# Patient Record
Sex: Male | Born: 1960
Health system: Southern US, Community
[De-identification: ages and names within clinical notes are randomized; demographics above are authoritative.]

## PROBLEM LIST (undated history)

## (undated) ENCOUNTER — Emergency Department (HOSPITAL_COMMUNITY): Payer: BC Managed Care – PPO | Source: Home / Self Care

## (undated) DIAGNOSIS — F329 Major depressive disorder, single episode, unspecified: Secondary | ICD-10-CM

## (undated) DIAGNOSIS — I10 Essential (primary) hypertension: Secondary | ICD-10-CM

## (undated) DIAGNOSIS — K439 Ventral hernia without obstruction or gangrene: Secondary | ICD-10-CM

## (undated) DIAGNOSIS — F32A Depression, unspecified: Secondary | ICD-10-CM

## (undated) DIAGNOSIS — E785 Hyperlipidemia, unspecified: Secondary | ICD-10-CM

## (undated) DIAGNOSIS — J449 Chronic obstructive pulmonary disease, unspecified: Secondary | ICD-10-CM

## (undated) DIAGNOSIS — H269 Unspecified cataract: Secondary | ICD-10-CM

## (undated) DIAGNOSIS — Z923 Personal history of irradiation: Secondary | ICD-10-CM

## (undated) DIAGNOSIS — E119 Type 2 diabetes mellitus without complications: Secondary | ICD-10-CM

## (undated) DIAGNOSIS — G4730 Sleep apnea, unspecified: Secondary | ICD-10-CM

## (undated) DIAGNOSIS — R9439 Abnormal result of other cardiovascular function study: Secondary | ICD-10-CM

## (undated) DIAGNOSIS — G4733 Obstructive sleep apnea (adult) (pediatric): Secondary | ICD-10-CM

## (undated) DIAGNOSIS — N189 Chronic kidney disease, unspecified: Secondary | ICD-10-CM

## (undated) DIAGNOSIS — I219 Acute myocardial infarction, unspecified: Secondary | ICD-10-CM

## (undated) DIAGNOSIS — I499 Cardiac arrhythmia, unspecified: Secondary | ICD-10-CM

## (undated) DIAGNOSIS — Z9889 Other specified postprocedural states: Secondary | ICD-10-CM

## (undated) DIAGNOSIS — I480 Paroxysmal atrial fibrillation: Secondary | ICD-10-CM

## (undated) DIAGNOSIS — I739 Peripheral vascular disease, unspecified: Secondary | ICD-10-CM

## (undated) DIAGNOSIS — K2270 Barrett's esophagus without dysplasia: Secondary | ICD-10-CM

## (undated) DIAGNOSIS — K219 Gastro-esophageal reflux disease without esophagitis: Secondary | ICD-10-CM

## (undated) DIAGNOSIS — R0602 Shortness of breath: Secondary | ICD-10-CM

## (undated) DIAGNOSIS — I639 Cerebral infarction, unspecified: Secondary | ICD-10-CM

## (undated) HISTORY — DX: Chronic obstructive pulmonary disease, unspecified: J44.9

## (undated) HISTORY — DX: Morbid (severe) obesity due to excess calories: E66.01

## (undated) HISTORY — DX: Hyperlipidemia, unspecified: E78.5

## (undated) HISTORY — DX: Obstructive sleep apnea (adult) (pediatric): G47.33

## (undated) HISTORY — DX: Sleep apnea, unspecified: G47.30

## (undated) HISTORY — DX: Essential (primary) hypertension: I10

## (undated) HISTORY — PX: HERNIA REPAIR: SHX51

## (undated) HISTORY — DX: Peripheral vascular disease, unspecified: I73.9

## (undated) HISTORY — DX: Shortness of breath: R06.02

## (undated) HISTORY — DX: Abnormal result of other cardiovascular function study: R94.39

## (undated) HISTORY — DX: Type 2 diabetes mellitus without complications: E11.9

## (undated) HISTORY — DX: Acute myocardial infarction, unspecified: I21.9

## (undated) HISTORY — DX: Paroxysmal atrial fibrillation: I48.0

## (undated) HISTORY — DX: Other specified postprocedural states: Z98.890

---

## 2001-09-25 ENCOUNTER — Ambulatory Visit (HOSPITAL_COMMUNITY): Admission: RE | Admit: 2001-09-25 | Discharge: 2001-09-25 | Payer: Self-pay | Admitting: Family Medicine

## 2001-09-25 ENCOUNTER — Encounter: Payer: Self-pay | Admitting: Family Medicine

## 2002-08-06 ENCOUNTER — Ambulatory Visit (HOSPITAL_COMMUNITY): Admission: RE | Admit: 2002-08-06 | Discharge: 2002-08-06 | Payer: Self-pay | Admitting: Cardiology

## 2002-08-14 ENCOUNTER — Ambulatory Visit (HOSPITAL_COMMUNITY): Admission: RE | Admit: 2002-08-14 | Discharge: 2002-08-14 | Payer: Self-pay | Admitting: Cardiology

## 2002-08-15 ENCOUNTER — Ambulatory Visit (HOSPITAL_COMMUNITY): Admission: RE | Admit: 2002-08-15 | Discharge: 2002-08-16 | Payer: Self-pay | Admitting: Cardiology

## 2003-10-01 ENCOUNTER — Inpatient Hospital Stay (HOSPITAL_COMMUNITY): Admission: AD | Admit: 2003-10-01 | Discharge: 2003-10-02 | Payer: Self-pay | Admitting: Family Medicine

## 2005-10-14 ENCOUNTER — Ambulatory Visit: Payer: Self-pay | Admitting: Cardiology

## 2005-10-15 ENCOUNTER — Ambulatory Visit: Payer: Self-pay | Admitting: *Deleted

## 2005-10-15 ENCOUNTER — Encounter (HOSPITAL_COMMUNITY): Admission: RE | Admit: 2005-10-15 | Discharge: 2005-10-15 | Payer: Self-pay | Admitting: Cardiology

## 2005-10-18 ENCOUNTER — Ambulatory Visit (HOSPITAL_COMMUNITY): Admission: RE | Admit: 2005-10-18 | Discharge: 2005-10-18 | Payer: Self-pay | Admitting: General Surgery

## 2005-10-28 ENCOUNTER — Ambulatory Visit: Payer: Self-pay | Admitting: Cardiology

## 2005-12-22 ENCOUNTER — Ambulatory Visit (HOSPITAL_COMMUNITY): Admission: RE | Admit: 2005-12-22 | Discharge: 2005-12-22 | Payer: Self-pay | Admitting: Urology

## 2006-01-21 ENCOUNTER — Ambulatory Visit (HOSPITAL_COMMUNITY): Admission: RE | Admit: 2006-01-21 | Discharge: 2006-01-21 | Payer: Self-pay | Admitting: Urology

## 2006-11-01 HISTORY — PX: CORONARY ARTERY BYPASS GRAFT: SHX141

## 2007-09-13 ENCOUNTER — Ambulatory Visit (HOSPITAL_COMMUNITY): Admission: RE | Admit: 2007-09-13 | Discharge: 2007-09-13 | Payer: Self-pay | Admitting: *Deleted

## 2007-09-15 ENCOUNTER — Ambulatory Visit: Payer: Self-pay | Admitting: Cardiothoracic Surgery

## 2007-09-15 ENCOUNTER — Encounter: Payer: Self-pay | Admitting: Cardiothoracic Surgery

## 2007-09-16 ENCOUNTER — Inpatient Hospital Stay (HOSPITAL_COMMUNITY): Admission: AD | Admit: 2007-09-16 | Discharge: 2007-09-24 | Payer: Self-pay | Admitting: *Deleted

## 2007-09-22 ENCOUNTER — Ambulatory Visit: Payer: Self-pay | Admitting: Cardiothoracic Surgery

## 2007-10-02 ENCOUNTER — Ambulatory Visit (HOSPITAL_COMMUNITY): Admission: RE | Admit: 2007-10-02 | Discharge: 2007-10-02 | Payer: Self-pay | Admitting: Cardiothoracic Surgery

## 2007-10-03 ENCOUNTER — Ambulatory Visit: Payer: Self-pay | Admitting: Thoracic Surgery (Cardiothoracic Vascular Surgery)

## 2007-10-12 ENCOUNTER — Encounter: Admission: RE | Admit: 2007-10-12 | Discharge: 2007-10-12 | Payer: Self-pay | Admitting: Cardiothoracic Surgery

## 2007-10-12 ENCOUNTER — Ambulatory Visit: Payer: Self-pay | Admitting: Cardiothoracic Surgery

## 2007-10-27 ENCOUNTER — Ambulatory Visit (HOSPITAL_COMMUNITY): Admission: RE | Admit: 2007-10-27 | Discharge: 2007-10-27 | Payer: Self-pay | Admitting: *Deleted

## 2007-10-31 ENCOUNTER — Encounter (HOSPITAL_COMMUNITY): Admission: RE | Admit: 2007-10-31 | Discharge: 2007-11-01 | Payer: Self-pay | Admitting: *Deleted

## 2007-11-02 DIAGNOSIS — Z9889 Other specified postprocedural states: Secondary | ICD-10-CM

## 2007-11-02 HISTORY — DX: Other specified postprocedural states: Z98.890

## 2007-11-02 HISTORY — PX: CARDIAC CATHETERIZATION: SHX172

## 2007-11-03 DIAGNOSIS — R0609 Other forms of dyspnea: Secondary | ICD-10-CM | POA: Diagnosis present

## 2007-11-03 DIAGNOSIS — R0602 Shortness of breath: Secondary | ICD-10-CM

## 2007-11-03 HISTORY — DX: Shortness of breath: R06.02

## 2007-11-06 ENCOUNTER — Encounter (HOSPITAL_COMMUNITY): Admission: RE | Admit: 2007-11-06 | Discharge: 2007-12-06 | Payer: Self-pay | Admitting: *Deleted

## 2007-11-09 ENCOUNTER — Encounter
Admission: RE | Admit: 2007-11-09 | Discharge: 2007-11-09 | Payer: Self-pay | Admitting: Thoracic Surgery (Cardiothoracic Vascular Surgery)

## 2007-11-09 ENCOUNTER — Ambulatory Visit: Payer: Self-pay | Admitting: Thoracic Surgery (Cardiothoracic Vascular Surgery)

## 2007-11-13 ENCOUNTER — Encounter: Payer: Self-pay | Admitting: Pulmonary Disease

## 2007-12-11 ENCOUNTER — Ambulatory Visit (HOSPITAL_COMMUNITY): Admission: RE | Admit: 2007-12-11 | Discharge: 2007-12-11 | Payer: Self-pay | Admitting: *Deleted

## 2007-12-11 ENCOUNTER — Encounter (HOSPITAL_COMMUNITY): Admission: RE | Admit: 2007-12-11 | Discharge: 2008-01-10 | Payer: Self-pay | Admitting: *Deleted

## 2007-12-15 ENCOUNTER — Encounter: Payer: Self-pay | Admitting: Pulmonary Disease

## 2008-01-02 ENCOUNTER — Ambulatory Visit (HOSPITAL_COMMUNITY): Admission: RE | Admit: 2008-01-02 | Discharge: 2008-01-02 | Payer: Self-pay | Admitting: Family Medicine

## 2008-01-11 ENCOUNTER — Observation Stay (HOSPITAL_COMMUNITY): Admission: EM | Admit: 2008-01-11 | Discharge: 2008-01-13 | Payer: Self-pay | Admitting: Emergency Medicine

## 2008-02-19 ENCOUNTER — Encounter (HOSPITAL_COMMUNITY): Admission: RE | Admit: 2008-02-19 | Discharge: 2008-03-20 | Payer: Self-pay | Admitting: *Deleted

## 2008-03-01 HISTORY — PX: OTHER SURGICAL HISTORY: SHX169

## 2008-03-09 ENCOUNTER — Emergency Department (HOSPITAL_COMMUNITY): Admission: EM | Admit: 2008-03-09 | Discharge: 2008-03-09 | Payer: Self-pay | Admitting: Emergency Medicine

## 2008-03-14 ENCOUNTER — Ambulatory Visit (HOSPITAL_COMMUNITY): Admission: RE | Admit: 2008-03-14 | Discharge: 2008-03-14 | Payer: Self-pay | Admitting: Family Medicine

## 2008-03-14 ENCOUNTER — Ambulatory Visit: Payer: Self-pay | Admitting: Gastroenterology

## 2008-03-14 ENCOUNTER — Encounter: Payer: Self-pay | Admitting: Gastroenterology

## 2008-03-14 ENCOUNTER — Ambulatory Visit (HOSPITAL_COMMUNITY): Admission: RE | Admit: 2008-03-14 | Discharge: 2008-03-14 | Payer: Self-pay | Admitting: Gastroenterology

## 2008-03-22 ENCOUNTER — Encounter (HOSPITAL_COMMUNITY)
Admission: RE | Admit: 2008-03-22 | Discharge: 2008-04-21 | Payer: Self-pay | Admitting: Rehabilitative and Restorative Service Providers"

## 2008-06-12 ENCOUNTER — Encounter: Payer: Self-pay | Admitting: Pulmonary Disease

## 2008-06-20 DIAGNOSIS — I480 Paroxysmal atrial fibrillation: Secondary | ICD-10-CM | POA: Insufficient documentation

## 2008-06-20 DIAGNOSIS — I10 Essential (primary) hypertension: Secondary | ICD-10-CM | POA: Insufficient documentation

## 2008-06-20 DIAGNOSIS — I2510 Atherosclerotic heart disease of native coronary artery without angina pectoris: Secondary | ICD-10-CM | POA: Insufficient documentation

## 2008-06-20 DIAGNOSIS — E782 Mixed hyperlipidemia: Secondary | ICD-10-CM | POA: Insufficient documentation

## 2008-06-21 ENCOUNTER — Ambulatory Visit: Payer: Self-pay | Admitting: Pulmonary Disease

## 2008-06-21 DIAGNOSIS — G4733 Obstructive sleep apnea (adult) (pediatric): Secondary | ICD-10-CM | POA: Insufficient documentation

## 2008-06-21 DIAGNOSIS — G2581 Restless legs syndrome: Secondary | ICD-10-CM | POA: Insufficient documentation

## 2008-06-21 DIAGNOSIS — J309 Allergic rhinitis, unspecified: Secondary | ICD-10-CM | POA: Insufficient documentation

## 2008-06-21 DIAGNOSIS — I219 Acute myocardial infarction, unspecified: Secondary | ICD-10-CM | POA: Insufficient documentation

## 2008-07-21 ENCOUNTER — Encounter: Payer: Self-pay | Admitting: Pulmonary Disease

## 2008-07-25 ENCOUNTER — Inpatient Hospital Stay (HOSPITAL_COMMUNITY): Admission: RE | Admit: 2008-07-25 | Discharge: 2008-07-26 | Payer: Self-pay | Admitting: Cardiology

## 2008-08-02 ENCOUNTER — Telehealth: Payer: Self-pay | Admitting: Pulmonary Disease

## 2008-08-23 ENCOUNTER — Ambulatory Visit: Payer: Self-pay | Admitting: Pulmonary Disease

## 2008-12-11 ENCOUNTER — Encounter: Payer: Self-pay | Admitting: Pulmonary Disease

## 2009-01-08 ENCOUNTER — Ambulatory Visit (HOSPITAL_COMMUNITY): Admission: RE | Admit: 2009-01-08 | Discharge: 2009-01-08 | Payer: Self-pay | Admitting: Family Medicine

## 2009-01-10 ENCOUNTER — Encounter (HOSPITAL_COMMUNITY): Admission: RE | Admit: 2009-01-10 | Discharge: 2009-02-09 | Payer: Self-pay | Admitting: Family Medicine

## 2009-01-10 ENCOUNTER — Ambulatory Visit (HOSPITAL_COMMUNITY): Admission: RE | Admit: 2009-01-10 | Discharge: 2009-01-10 | Payer: Self-pay | Admitting: Internal Medicine

## 2009-01-14 ENCOUNTER — Ambulatory Visit (HOSPITAL_COMMUNITY): Admission: RE | Admit: 2009-01-14 | Discharge: 2009-01-14 | Payer: Self-pay | Admitting: Family Medicine

## 2009-01-22 ENCOUNTER — Encounter: Payer: Self-pay | Admitting: Pulmonary Disease

## 2009-07-03 ENCOUNTER — Telehealth (INDEPENDENT_AMBULATORY_CARE_PROVIDER_SITE_OTHER): Payer: Self-pay | Admitting: *Deleted

## 2009-07-04 ENCOUNTER — Ambulatory Visit: Payer: Self-pay | Admitting: Pulmonary Disease

## 2010-06-01 ENCOUNTER — Ambulatory Visit: Payer: Self-pay | Admitting: Internal Medicine

## 2010-06-01 ENCOUNTER — Encounter: Payer: Self-pay | Admitting: Urgent Care

## 2010-06-01 DIAGNOSIS — K219 Gastro-esophageal reflux disease without esophagitis: Secondary | ICD-10-CM | POA: Insufficient documentation

## 2010-06-01 DIAGNOSIS — K921 Melena: Secondary | ICD-10-CM | POA: Insufficient documentation

## 2010-06-01 DIAGNOSIS — R109 Unspecified abdominal pain: Secondary | ICD-10-CM | POA: Insufficient documentation

## 2010-06-01 DIAGNOSIS — R319 Hematuria, unspecified: Secondary | ICD-10-CM | POA: Insufficient documentation

## 2010-06-02 ENCOUNTER — Ambulatory Visit (HOSPITAL_COMMUNITY): Admission: RE | Admit: 2010-06-02 | Discharge: 2010-06-02 | Payer: Self-pay | Admitting: Internal Medicine

## 2010-06-02 LAB — CONVERTED CEMR LAB
ALT: 41 units/L (ref 0–53)
AST: 24 units/L (ref 0–37)
Alkaline Phosphatase: 76 units/L (ref 39–117)
Amylase: 30 units/L (ref 0–105)
Bilirubin Urine: NEGATIVE
Bilirubin, Direct: 0.1 mg/dL (ref 0.0–0.3)
Indirect Bilirubin: 0.4 mg/dL (ref 0.0–0.9)
Ketones, ur: NEGATIVE mg/dL
Lymphocytes Relative: 29 % (ref 12–46)
Lymphs Abs: 3.8 10*3/uL (ref 0.7–4.0)
Monocytes Relative: 6 % (ref 3–12)
Neutro Abs: 7.9 10*3/uL — ABNORMAL HIGH (ref 1.7–7.7)
Neutrophils Relative %: 61 % (ref 43–77)
Platelets: 325 10*3/uL (ref 150–400)
RBC: 4.72 M/uL (ref 4.22–5.81)
Specific Gravity, Urine: 1.012 (ref 1.005–1.030)
Total Bilirubin: 0.5 mg/dL (ref 0.3–1.2)
Urine Glucose: NEGATIVE mg/dL
Urobilinogen, UA: 0.2 (ref 0.0–1.0)
WBC: 13 10*3/uL — ABNORMAL HIGH (ref 4.0–10.5)
pH: 5 (ref 5.0–8.0)

## 2010-06-10 ENCOUNTER — Encounter: Payer: Self-pay | Admitting: Urgent Care

## 2010-07-03 ENCOUNTER — Ambulatory Visit (HOSPITAL_COMMUNITY): Admission: RE | Admit: 2010-07-03 | Discharge: 2010-07-03 | Payer: Self-pay | Admitting: Cardiology

## 2010-07-08 ENCOUNTER — Ambulatory Visit (HOSPITAL_COMMUNITY): Admission: RE | Admit: 2010-07-08 | Discharge: 2010-07-08 | Payer: Self-pay | Admitting: Cardiovascular Disease

## 2010-07-08 HISTORY — PX: CARDIAC CATHETERIZATION: SHX172

## 2010-08-28 ENCOUNTER — Encounter (INDEPENDENT_AMBULATORY_CARE_PROVIDER_SITE_OTHER): Payer: Self-pay | Admitting: *Deleted

## 2010-09-23 ENCOUNTER — Ambulatory Visit (HOSPITAL_COMMUNITY): Admission: RE | Admit: 2010-09-23 | Discharge: 2010-09-23 | Payer: Self-pay | Admitting: Family Medicine

## 2010-09-29 ENCOUNTER — Ambulatory Visit: Payer: Self-pay | Admitting: Gastroenterology

## 2010-09-29 DIAGNOSIS — R1013 Epigastric pain: Secondary | ICD-10-CM | POA: Insufficient documentation

## 2010-10-01 DIAGNOSIS — Z9889 Other specified postprocedural states: Secondary | ICD-10-CM

## 2010-10-01 HISTORY — DX: Other specified postprocedural states: Z98.890

## 2010-10-08 ENCOUNTER — Ambulatory Visit (HOSPITAL_COMMUNITY)
Admission: RE | Admit: 2010-10-08 | Discharge: 2010-10-08 | Payer: Self-pay | Source: Home / Self Care | Attending: Gastroenterology | Admitting: Gastroenterology

## 2010-11-22 ENCOUNTER — Encounter: Payer: Self-pay | Admitting: Family Medicine

## 2010-12-01 NOTE — Assessment & Plan Note (Signed)
Summary: ABD PAIN/SLJ   Visit Type:  Initial Consult Referring Provider:  Eicchorn Primary Care Provider:  Phillips Odor  Chief Complaint:  abd pain.  History of Present Illness: 50 y/o obese caucasian male w/ intermittant mid-abd pain x 1 year.  c/o choking or dry heaving or occ vomiting couple times per week.  Heartburn/indigestion and takes prlosec as needed 1-2 per week. Denies dysphagia/odynophagia.  Takes vicodin rarely.  Takes 81mg  ASA two times a day.  Denies constipation, diarrhea, or melena.  Not necessarily assoc w/ eating or movt.  Occ scant dark rectal bleeding, pt suspects hemorrhoids.  Colonoscopy 2009 Dr Fields->3-23mm trans colon erosion-surface injury/ulceration w/assoc granulation tissue felt to be due to ASA.  Denies fever or chills.  c/o hematuria.  Current Problems (verified): 1)  Hematuria Unspecified  (ICD-599.70) 2)  Abdominal Pain  (ICD-789.00) 3)  Restless Legs Syndrome  (ICD-333.94) 4)  Obstructive Sleep Apnea  (ICD-327.23) 5)  Allergic Rhinitis  (ICD-477.9) 6)  Hx of Myocardial Infarction  (ICD-410.90) 7)  Coronary Artery Disease  (ICD-414.00) 8)  Exogenous Obesity  (ICD-278.00) 9)  Paroxysmal Atrial Fibrillation  (ICD-427.31) 10)  Hypertension  (ICD-401.9) 11)  Hyperlipidemia  (ICD-272.4)  Current Medications (verified): 1)  Ranexa 1000 Mg  Xr12h-Tab (Ranolazine) .... Take 1 Tablet By Mouth Two Times A Day 2)  Nitroglycerin 0.4 Mg  Subl (Nitroglycerin) .... Use As Directed As Needed 3)  Tylenol Pm Extra Strength 500-25 Mg  Tabs (Diphenhydramine-Apap (Sleep)) .... Take By Mouth As Needed 4)  Promethazine Hcl 25 Mg  Tabs (Promethazine Hcl) .... Take By Mouth As Needed 5)  Vicodin 5-500 Mg  Tabs (Hydrocodone-Acetaminophen) .... Take By Mouth As Needed 6)  Prilosec 20 Mg Cpdr (Omeprazole) .... Take 1 Tablet By Mouth Two Times A Day 7)  Aspirin 81 Mg Tbec (Aspirin) .... Take 1 Tablet By Mouth Two Times A Day 8)  Crestor 20 Mg Tabs (Rosuvastatin Calcium) .... Take 1  Tablet By Mouth Once A Day 9)  Trilipix 135 Mg Cpdr (Choline Fenofibrate) .... Take 1 Tablet By Mouth Once A Day 10)  Niaspan 1000 Mg Cr-Tabs (Niacin (Antihyperlipidemic)) .... One Tablet At Bedtime 11)  Effient 10 Mg Tabs (Prasugrel Hcl) .... Take 1 Tablet By Mouth Once A Day 12)  Bystolic 10 Mg Tabs (Nebivolol Hcl) .... 1/2 Tablet Daily 13)  Furosemide 80 Mg Tabs (Furosemide) .... Take 1 Tablet By Mouth Once A Day 14)  Klor-Con 20 Meq Pack (Potassium Chloride) .... One Tablet Twice Daily 15)  Isosorbide Mononitrate Cr 60 Mg Xr24h-Tab (Isosorbide Mononitrate) .... One Tablet Every 8 Hours 16)  Lisinopril 5 Mg Tabs (Lisinopril) .... 1/2 Tablet Daily  Allergies (verified): 1)  ! Iodine (Iodine) 2)  ! * Contrast Dye  Past History:  Past Medical History: Current Problems:  ALLERGIC RHINITIS (ICD-477.9) Hx of MYOCARDIAL INFARCTION (ICD-410.90) CORONARY ARTERY DISEASE (ICD-414.00)-s/p cabg EXOGENOUS OBESITY (ICD-278.00) PAROXYSMAL ATRIAL FIBRILLATION (ICD-427.31) HYPERTENSION (ICD-401.9) COPD HYPERLIPIDEMIA (ICD-272.4) Sleep apnea colonoscopy 2009 Dr Leanne Lovely colon ulcer felt to be sec ASA Hx anaphylaxis w/ IV contrast    Past Surgical History: 2007 ventral hernia repair Dr Lovell Sheehan  CABG 09/2007 Dr Ranell Patrick  Family History: heart disease: mother, father No known family history of colorectal carcinoma, IBD, liver or chronic GI problems.  Social History: Patient is a current smoker.  5 cigs per day  pt is married.  Disability 2008 Alcohol Use - beer 2x/wk Illicit Drug Use - no Drug Use:  no  Review of Systems  See HPI General:  Complains of fatigue; denies fever, chills, sweats, anorexia, weakness, malaise, weight loss, and sleep disorder. CV:  Denies chest pains, angina, palpitations, syncope, dyspnea on exertion, orthopnea, PND, peripheral edema, and claudication. Resp:  Denies dyspnea at rest, dyspnea with exercise, cough, sputum, wheezing, coughing up blood,  and pleurisy. GI:  Denies difficulty swallowing, pain on swallowing, jaundice, black BMs, and fecal incontinence. GU:  Denies urinary burning, urinary frequency, urinary hesitancy, nocturnal urination, and urinary incontinence. Derm:  Denies rash, itching, dry skin, hives, moles, warts, and unhealing ulcers. Psych:  Denies depression, anxiety, memory loss, suicidal ideation, hallucinations, paranoia, phobia, and confusion. Heme:  Denies bruising and enlarged lymph nodes.  Vital Signs:  Patient profile:   51 year old male Height:      71 inches Weight:      318 pounds BMI:     44.51 Temp:     99.1 degrees F oral Pulse rate:   84 / minute BP sitting:   110 / 78  (left arm) Cuff size:   regular  Vitals Entered By: Cloria Spring LPN (June 01, 2010 2:30 PM)  Physical Exam  General:  obese male in nad Eyes:  sclera clear, no icterus. Nose:  No deformity, discharge,  or lesions. Mouth:  No deformity or lesions, dentition normal. Neck:  no JVD, thyromegaly, or lymphadenopathy. Lungs:  Clear throughout to auscultation. Heart:  Regular rate and rhythm; no murmurs, rubs,  or bruits. Abdomen:  well healed ventral hernia scars mid abd, normal bowel sounds, obese, and sm umbilical hernia easily reducible, non-tender.   Msk:  Symmetrical with no gross deformities. Normal posture. Extremities:  trace pedal edema.   Neurologic:  Alert and  oriented x4;  grossly normal neurologically. Skin:  Intact without significant lesions or rashes. Cervical Nodes:  No significant cervical adenopathy. Psych:  Alert and cooperative. Normal mood and affect.  Impression & Recommendations:  Problem # 1:  ABDOMINAL PAIN (ICD-789.00) 50 y/o obese caucasian male w/ chronic abd pain, hematochezia, GERD not on PPI, N/V & hematuria.  Hx single erosion transv colon felt to be sec ASA on 2009 colonoscopy, without evidence of diverticulosis.  Differentials include evolving IBD, ischemia, hernia, renal lithiasis, UTI,  gallbladder disease, functional abd pain w/ benign anorectal bleeding. Proceed w/ CT a/P w/o contrast given hx anaphylaxis w/ IV contrast. Orders: T-CBC w/Diff ([id number]) T-Hepatic Function [phone]) T-Lipase ([id number]) T-Amylase ([id number]) T-Urinalysis ([id number]) Est. Patient Level IV (61950)  Problem # 2:  HEMATURIA UNSPECIFIED (ICD-599.70) See #1  Problem # 3:  HEMATOCHEZIA (ICD-578.1) See #1  Orders: Est. Patient Level IV (93267)  Problem # 4:  GERD (ICD-530.81) Resume daily PPI  Orders: Est. Patient Level IV (12458)  Patient Instructions: 1)  To ER w/ severe  2)  Use vicodin you have already as needed pain  Appended Document: ABD PAIN/SLJ Please call pt. He needs to take OMP 30 minutes prior to his first and last meal for 3 months. Follow low fat diet. OPV in 3 mos with Dr. Darrick Penna.   Appended Document: ABD PAIN/SLJ Pt informed.  Appended Document: ABD PAIN/SLJ reminder in computer

## 2010-12-01 NOTE — Assessment & Plan Note (Signed)
Summary: GERD, ABD PAIN   Visit Type:  Follow-up Visit Primary Care [name]:  Phillips Odor, M.D.  Chief Complaint:  follow up- doing ok.  History of Present Illness: Seen for abd pain and if pain comes on Protonix doesn't help. Pain: top of stomach, burning like indigestion, 30 mins to an hour, not really related to eating, was asleep and it would wake him up. 1 or 2 beers once or twice a week. Was drinking every other day: 1-2 beers, most 4 beers. Has a HO on low fat diet and trying to follow a low fat diet. Saw a nutritionist 1-2 years ago. If eats too much, he will throw up. Had a heart cath last week and wires "are broken" and felt like they were sticking him and causing pain. Rectal bleeding: sore raw, wipes and sees blood. Sx every other day.   Pt having trouble paying for meds.  Current Medications (verified): 1)  Ranexa 1000 Mg  Xr12h-Tab (Ranolazine) .... Take 1 Tablet By Mouth Two Times A Day 2)  Nitroglycerin 0.4 Mg  Subl (Nitroglycerin) .... Use As Directed As Needed 3)  Tylenol Pm Extra Strength 500-25 Mg  Tabs (Diphenhydramine-Apap (Sleep)) .... Take By Mouth As Needed 4)  Promethazine Hcl 25 Mg  Tabs (Promethazine Hcl) .... Take By Mouth As Needed 5)  Vicodin 5-500 Mg  Tabs (Hydrocodone-Acetaminophen) .... Take By Mouth As Needed 6)  Aspirin 81 Mg Tbec (Aspirin) .... Take 1 Tablet By Mouth Two Times A Day 7)  Crestor 20 Mg Tabs (Rosuvastatin Calcium) .... Take 1 Tablet By Mouth Once A Day 8)  Trilipix 135 Mg Cpdr (Choline Fenofibrate) .... Take 1 Tablet By Mouth Once A Day 9)  Niaspan 1000 Mg Cr-Tabs (Niacin (Antihyperlipidemic)) .... One Tablet At Bedtime 10)  Effient 10 Mg Tabs (Prasugrel Hcl) .... Take 1 Tablet By Mouth Once A Day 11)  Bystolic 10 Mg Tabs (Nebivolol Hcl) .... 1/2 Tablet Daily 12)  Furosemide 80 Mg Tabs (Furosemide) .... Take 1 Tablet By Mouth Once A Day 13)  Klor-Con 20 Meq Pack (Potassium Chloride) .... One Tablet Twice Daily 14)  Isosorbide Mononitrate Cr  60 Mg Xr24h-Tab (Isosorbide Mononitrate) .... One Tablet Every 8 Hours 15)  Lisinopril 5 Mg Tabs (Lisinopril) .... Once Daily 16)  Protonix 40 Mg Tbec (Pantoprazole Sodium) .... Once Daily 17)  Proventil Hfa 108 (90 Base) Mcg/act Aers (Albuterol Sulfate) .... Once Daily 18)  Qvar 80 Mcg/act Aers (Beclomethasone Dipropionate) .... Once Daily  Allergies (verified): 1)  ! Iodine (Iodine) 2)  ! * Contrast Dye  Past History:  Past Medical History: Current Problems:  ALLERGIC RHINITIS (ICD-477.9) Hx of MYOCARDIAL INFARCTION (ICD-410.90) CORONARY ARTERY DISEASE (ICD-414.00)-s/p cabg EXOGENOUS OBESITY (ICD-278.00) PAROXYSMAL ATRIAL FIBRILLATION (ICD-427.31) HYPERTENSION (ICD-401.9) COPD HYPERLIPIDEMIA (ICD-272.4) Sleep apnea: setting? Colonoscopy 2009: SLF-(D125, V8, PHEN 12.5) **Transv colon ulcer 2o T0 ASA Hx anaphylaxis w/ IV contrast    Family History: heart disease: mother, father No known family history of colorectal carcinoma, IBD, liver or chronic GI problems. No stomach cancer.  Social History: Patient is a current smoker.  5 cigs per day  Pt is married.  Disability 2008 Alcohol Use - beer 2x/wk Illicit Drug Use - no  Review of Systems       MAR 2010: ABD U/S FATTY LIVER  AUG 2011: CT AP W/O CM-NAIAP, SML UMB HERNIA, tstc LESION ON RIGHT KIDNEY  NOV 2011: CXP PA&LAT-NACPD  Vital Signs:  Patient profile:   50 year old male Height:  71 inches Weight:      321 pounds BMI:     44.93 Temp:     98.3 degrees F oral Pulse rate:   76 / minute BP sitting:   118 / 84  (left arm) Cuff size:   large  Vitals Entered By: Hendricks Limes LPN (September 29, 2010 10:11 AM)  Physical Exam  General:  Well developed, well nourished, no acute distress. Head:  Normocephalic and atraumatic. Eyes:  PERRL, no icterus. Mouth:  No deformity or lesions. Neck:  Supple; no masses. Lungs:  Clear throughout to auscultation. Heart:  Regular rate and rhythm; no murmurs,. Abdomen:   Soft,MILD TTP IN THE EPIGASTRIUM, nondistended. Normal bowel sounds. Extremities:  No edema. Neurologic:  Alert and  oriented x4;  grossly normal neurologically.  Impression & Recommendations:  Problem # 1:  GERD (ICD-530.81) Assessment Improved Multiple lifestyle risk factors that can be modificed. Discussed obesity/weight loss surgery. Pt not interested. Change Protonix to OMEPRAZOLE If cheaper. Rx given. Pt told to go to [facility]. Take either one 30 minutes prior to your first meal and if having frequent breakthrough Sx then take one 30 minutes before you first and last meal. Eat 4-6 small meals a day. Follow a low fat diet. SEE HO. Continue weight loss effort. Follow up in 4 mos.  Problem # 2:  ABDOMINAL PAIN, EPIGASTRIC (ICD-789.06) Assessment: Improved Most likely 2o to uncontrolled reflux , non-ulcer dyspepsia, or EtOHic gastritis. Pt declined EGD. Needs cheaper PPI. Change to OMP two times a day-30 minutes prior to meals.  CC: PCP AND DR. EICCHORN  ADDENDUM 09/29/10 1318: PT CALLED AND CHANGED HIS MIND ABOUT THE EGD. Schedule 12/8 in OR DUE TO MULTIPLE PSYCHOACTIVE MEDS AND LARGE DOSE OF DEMEROL/VERSED required for TCS 2009.  Patient Instructions: 1)  Change Protonix to OMEPRAZOLE If cheaper. Take either one 30 minutes prior to your first meal and if having frequent breakthrough Sx then take one 30 minutes before you first and last meal. 2)  Eat 4-6 small meals a day. 3)  Follow a low fat diet. SEE HO. 4)  Continue weight loss effort. 5)  Follow up in 4 mos. 6)  The medication list was reviewed and reconciled.  All changed / newly prescribed medications were explained.  A complete medication list was provided to the patient / caregiver. Prescriptions: PRILOSEC 20 MG CPDR (OMEPRAZOLE) 1 by mouth 30 minutes prior to meals bid  #60 x 5   Entered and Authorized by:   West Bali MD   Signed by:   West Bali MD on 09/29/2010   Method used:   Print then Give to Patient   RxID:    [id number]   Appended Document: GERD, ABD PAIN 4 MONTH F/U OPV IS IN THE COMPUTER  Appended Document: GERD, ABD PAIN Pt scheduled for EGD on 10/08/10.Pt aware of appt. and instructions placed in the mail.  Appended Document: Orders Update    Clinical Lists Changes  Orders: Added new Service order of Est. Patient Level III (14782) - Signed

## 2010-12-01 NOTE — Letter (Signed)
Summary: EGD ORDER  EGD ORDER   Imported By: Ave Filter 09/29/2010 16:26:38  _____________________________________________________________________  External Attachment:    Type:   Image     Comment:   External Document

## 2010-12-01 NOTE — Letter (Signed)
Summary: ct order  ct order   Imported By: Hendricks Limes LPN 16/08/9603 54:09:81  _____________________________________________________________________  External Attachment:    Type:   Image     Comment:   External Document

## 2010-12-01 NOTE — Letter (Signed)
Summary: Recall Office Visit  [facility]  [address]   [place], [place] [address]   Phone: [phone]  Fax: [phone]      August 28, 2010   [name] [address] [place], [place]  [address] 1961/06/29   Dear [name],   According to our records, it is time for you to schedule a follow-up office visit with [place].   At your convenience, please call [phone] to schedule an office visit. If you have any questions, concerns, or feel that this letter is in error, we would appreciate your call.   Sincerely,    Diana Eves  [facility] Ph: [phone]   Fax: [phone]

## 2010-12-18 ENCOUNTER — Encounter (INDEPENDENT_AMBULATORY_CARE_PROVIDER_SITE_OTHER): Payer: Self-pay | Admitting: *Deleted

## 2010-12-23 NOTE — Letter (Signed)
Summary: Recall Office Visit  [facility]  [address]   [place], [place] [address]   Phone: [phone]  Fax: [phone]      December 18, 2010   CAELIN RAYL [address] [place], [place]  [address] [birth year]   Dear Mr. Goller,   According to our records, it is time for you to schedule a follow-up office visit with [place].   At your convenience, please call [phone] to schedule an office visit. If you have any questions, concerns, or feel that this letter is in error, we would appreciate your call.   Sincerely,    Diana Eves  [facility] Ph: [phone]   Fax: [phone]

## 2011-01-01 DIAGNOSIS — R9439 Abnormal result of other cardiovascular function study: Secondary | ICD-10-CM

## 2011-01-01 HISTORY — DX: Abnormal result of other cardiovascular function study: R94.39

## 2011-01-06 ENCOUNTER — Encounter (INDEPENDENT_AMBULATORY_CARE_PROVIDER_SITE_OTHER): Payer: Medicare Other | Admitting: Thoracic Surgery (Cardiothoracic Vascular Surgery)

## 2011-01-06 DIAGNOSIS — I2510 Atherosclerotic heart disease of native coronary artery without angina pectoris: Secondary | ICD-10-CM

## 2011-01-06 DIAGNOSIS — T82218A Other mechanical complication of coronary artery bypass graft, initial encounter: Secondary | ICD-10-CM

## 2011-01-07 NOTE — Letter (Signed)
January 06, 2011  Thurmon Fair, MD [address] [place], [place]  Re:  [name], DUCAT                  DOB:  1961/03/07  Dear Rachelle Hora:  I saw [name] in the office today.  Thank you very much for sending [name] for consultation.  As you know, he is a 50 year old gentleman who had coronary artery bypass grafting by Dr. Tyrone Sage in 2008.  He apparently about a year later had returned with angina and was found to have an occluded vein graft to his right coronary.  A portion of the vessel was not approachable percutaneously.  He has been managed medically for his angina since then.  As you know, in addition to some ongoing angina, he is also complaining of a sharp stabbing-type pain in the area of his manubrium.  He notices this quite frequently when he swallows and he was aware that all of his sternal wires were broken, and was concerned that this could be wires poking into vital structure.  I reviewed his chest x-ray from November and reviewed that film with [name].  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  It is possible that his wires are causing this sharp stabbing sensation, but they are not in the vicinity of any vital structures.  I do not think there is any danger because of the wires.  I offered him the option of a sternal reconstruction.  I think he would require plating rather than being able to reconstruct just with wires given his size and the length of time that he has been dehisced and I do think plating would be a reasonable option for him.  I do not think he is in any danger if he decides not to have surgery.  At the present time, once he understood the wires were not a risk to a vital organ, he is not interested in pursuing any type of surgical treatment at this point in time.  I did tell him that if his symptoms became worse or started to  interfere with his regular activities, he could call us back and we would be happy to see him about that at any time.  Again, thank you for sending [name] and please do not hesitate to contact me if I could be of any further assistance with his care.  Sincerely,  [name]. Dorris Fetch, M.D. Electronically Signed  SCH/MEDQ  D:  01/06/2011  T:  01/07/2011  Job:  [id number]  cc:   Corrie Mckusick, M.D.

## 2011-01-07 NOTE — Consult Note (Signed)
NEW PATIENT CONSULTATION  ISSAAC, SHIPPER DOB:  September 18, 1961                                        January 06, 2011 CHART #:  [id number]  REASON FOR VISIT:  Chest pain, question secondary to sternal wires.  HISTORY OF PRESENT ILLNESS:  Mr. Teegarden is a 50 year old gentleman who underwent coronary artery bypass grafting x4 in November 2008 which was done by Dr. Tyrone Sage.  His postoperative course was unremarkable.  I actually saw him back in the office one time in Dr. Dennie Maizes absence and he was having some trouble with sleep disturbance, which eventually resolved, some GI complaints which were resolved.  Sounds like about a year after surgery, he had some recurrent angina and at that time, his repeat catheterization showed occlusion of his saphenous vein graft to the right coronary system.  His remaining three grafts were patent. There was no percutaneous options.  He has been managed medically since that time.  Apparently, he has always had some clicking and popping in his sternum, never quite healed.  Recently, he has been having some chest pain.  He saw Dr. Royann Shivers a couple of weeks ago.  He had been having dinner with his wife and during dinner, he developed onset of chest tightness.  He said he felt lightheaded, short of breath, and had severe pressure in his chest.  He said it resolved in about 5 minutes. He saw Dr. Royann Shivers about that.  He has had some noninvasive testing which has shown inferior wall infarction and ischemia, but no other areas of compromised myocardial perfusion.  He did tell Dr. Royann Shivers about a second type of discomfort that he has, which is described as sticking pain in his upper chest typically when swallowing, but sometimes will happen with turning his head and a certain direction.  He says he will have this may be every 2 or 3 weeks, sometimes will happen multiple times in a day, and then he will have it again for another couple  of weeks.  He described this is a very sharp stabbing type of pain.  He knew his sternal wires were broken and was concerned that his wires could be pushing into vital organs.  CURRENT MEDICATIONS: 1. Aspirin 81 mg daily. 2. Lisinopril 5 mg daily. 3. Bystolic 10 mg daily. 4. Furosemide 80 mg daily. 5. Ranexa 1000 mg b.i.d. 6. Potassium chloride 20 mEq daily. 7. Niaspan 1000 mg nightly. 8. Omeprazole 20 mg daily. 9. Crestor 20 mg daily. 10.Imdur 60 mg b.i.d. 11.Albuterol inhaler p.r.n. 12.Metformin 500 mg b.i.d.  She has an allergy to IV DYE, which caused his heart to stop.  SOCIAL HISTORY:  He is married.  He lives with his wife.  He is still smoking about half-a-pack a day.  He quit it one time and then he became depressed after he found that the vein graft was occluded, and he started smoking again.  He was doing a pack a day, he is now down to half a pack a day.  REVIEW OF SYSTEMS:  See HPI.  In addition to that, he has had some dysphasia.  He has recently had endoscopy performed, I do not have the result of that study, but he says they found some infection and some Barrett's in his esophagus and stomach.  PHYSICAL EXAMINATION:  GENERAL:  Mr. Davalos is a 50 year old  gentleman in no acute distress.  He is morbidly obese. VITAL SIGNS:  Blood pressure is 120/90, pulse 100, respirations are 24, oxygen saturations 95% on room air. NEUROLOGIC:  He is alert and oriented x3 with no focal deficits. HEENT:  Unremarkable. NECK:  Without thyromegaly, adenopathy, or bruits. HEART:  Regular rate and rhythm.  Normal S1 and S2.  No rubs, murmurs, or gallops. LUNGS:  Mild expiratory wheezing bilaterally at the bases. CHEST:  His sternal skin incision is well healed.  Sternum is grossly unstable.  IMPRESSION:  Mr. Bargo is a 50 year old gentleman.  He had coronary artery bypass grafting 4 years ago.  He now presents with two distinct and possibly three distinct chest pain  syndromes.  He has some angina and he had an unstable episode a couple of weeks ago.  He has been managed for that by Dr. Royann Shivers.  He has an occluded vein graft to his right coronary system.  This is likely the culprit for that.  He has not had any further episodes of that since the episode at dinner with his wife a couple of weeks ago.  The second pain syndrome is more of a sticking or jabbing sensation he feels in his upper chest.  He says this usually happens when he swallows.  It does not happen every time, but will have an episode every 2-3 weeks, and he was concerned that it could be possibly due to his sternal wires.  I reviewed the films with him.  He actually had a CT scan done back in 2009, which showed sternal dehiscence, and he has an obvious sternal dehiscence on exam.  His wires are multiply fragmented and there is actually a wire that could be potentially causing this sensation, although it is not definite.  He does have some esophagitis and Barrett esophagus leading me to question whether he could have some reflux and that may be somewhat responsible for his symptoms with swallowing.  In any event, I cannot rule out that the wire does not contribute a little bit to this sensation, but I did review the films with him and it does not appear to be an issue with the wire actually compromising any type of vital organs.  It is pretty clear by the CT scan that the wires in question well above the cardiac shadow, the aortic shadow, which is delineated by the rings around the grafts appear to be well away from the wire and it is nowhere in the vicinity of his trachea or esophagus.  Mr. Macomber was very reassured to see that these wires were not in the vicinity of any vital organs.  He would potentially be a candidate for sternal reconstruction.  I think he would require plating.  I do not think rewiring would be successful given his size and the length of time that he has  been dehisced, but potentially be a candidate for plating if he should desire, however, at this point in time, given the reassurance that the wires are not compromising any vital organs, he does not wish to pursue any treatment presently.  I did advise him that if he were to experience more severe pains or more regular pains that began to interfere with his usual activities that we could plate his sternum at any time he would desire.  For now, he will follow up with Dr. Royann Shivers. I would be happy to see him back at any time if I could be of any further assistance with his  care.  Salvatore Decent. Dorris Fetch, M.D. Electronically Signed  SCH/MEDQ  D:  01/06/2011  T:  01/07/2011  Job:  [id number]

## 2011-01-11 LAB — HEMOGLOBIN AND HEMATOCRIT, BLOOD
HCT: 43.2 % (ref 39.0–52.0)
Hemoglobin: 16 g/dL (ref 13.0–17.0)

## 2011-01-11 LAB — BASIC METABOLIC PANEL
BUN: 10 mg/dL (ref 6–23)
CO2: 25 mEq/L (ref 19–32)
Calcium: 9.8 mg/dL (ref 8.4–10.5)
Chloride: 103 mEq/L (ref 96–112)
Creatinine, Ser: 1.08 mg/dL (ref 0.4–1.5)
GFR calc Af Amer: >60 mL/min (ref >60)
GFR calc non Af Amer: >60 mL/min (ref >60)
Glucose, Bld: 162 mg/dL — ABNORMAL HIGH (ref 70–99)
Potassium: 3.4 mEq/L — ABNORMAL LOW (ref 3.5–5.1)
Sodium: 138 mEq/L (ref 135–145)

## 2011-01-14 LAB — GLUCOSE, CAPILLARY: Glucose-Capillary: 161 mg/dL — ABNORMAL HIGH (ref 70–99)

## 2011-03-16 NOTE — Assessment & Plan Note (Signed)
OFFICE VISIT   [name], [name]  DOB:  May 29, 1961                                        October 03, 2007  CHART #:  [id number]   HISTORY OF PRESENT ILLNESS:  The patient is a 50 year old gentleman, who  underwent coronary bypass grafting x4, by Dr. Tyrone Sage on 09/18/2007.  Postoperatively, his course was uncomplicated.  He did have some fluid  overload, but was discharged without any significant complications.  He  states that over the past several days, really beginning over the  weekend, he was having progressively more coughing, he was having  difficulty sleeping.  He did get some shortness of breath with exertion,  but his primary concern was coughing.  He is also having difficulty  lying flat.  He has been bringing up some clear to yellow mucus.  He has  not had any fevers or chills.   He had a chest x-ray done at [facility] yesterday, which showed some  left lower lobe consolidation, as well as effusion.  He was started on  Avelox by Dr. Tyrone Sage, and now comes in for a checkup.   PHYSICAL EXAMINATION:  GENERAL/VITAL SIGNS:  On physical examination the  patient is a 50 year old white male in no acute distress.  His blood  pressure is 119/70, pulse rate 78, respirations are 22, oxygen  saturation is 91%, he is afebrile.  His oxygen saturation is on room  air.  LUNGS:  His lungs have diminished breath sounds at the left base, so  mild crackles up the right base.  He has 1+ edema in his right lower  extremity and trace edema in his left lower extremity.  CARDIAC:  He has a regular rate and rhythm, with no rub or murmur.   DATA:  A chest x-ray shows a moderate left pleural effusion, as well as  some left lower lobe opacity, infiltrate versus compressive atelectasis.   IMPRESSION:  The patient is a 50 year old gentleman, who is recently  status post coronary artery bypass grafting about two weeks ago.  He now  presents with some orthopnea, some  shortness of breath, and a persistent  cough.  He has a left pleural effusion.  There is a question of  pneumonia, but, given his presentation without fever and without  markedly productive cough with no purulent sputum, I suspect that he  most likely just has a postoperative left pleural effusion which is  symptomatic.  He does have some volume overload, but also some swelling  in his lower extremities, right greater than left, and his saphenous  vein was harvested from the right side.  There is no calf tenderness or  other suggestion of deep venous thrombosis or pulmonary embolism.   He has been started on Avelox empirically, and I advised him to continue  to take that throughout its course.  He does need some Lasix for volume  overloading, and given a prescription for 40 mg daily for seven days,  and also a prescription for potassium 20 mEq for seven days.  In  addition, the cough is both bothersome to him and concerning to him,  because he is afraid he will hurt his sternal closure.  I gave him a  prescription for Tussionex 5 mL p.o. b.i.d.  he should take that around-  the-clock for seven days, and then  on a p.r.n. basis after that.  He may  continue to use the cough drops and over-the-counter cough medication as  necessary to supplement.   We will plan to have him come back with a repeat chest x-ray on  10/12/2007, and see Dr. Tyrone Sage at that time.   Salvatore Decent Dorris Fetch, M.D.  Electronically Signed   SCH/MEDQ  D:  10/03/2007  T:  10/03/2007  Job:  [id number]   cc:   Sheliah Plane, MD  Dani Gobble, MD

## 2011-03-16 NOTE — Consult Note (Signed)
NAMEVICKIE, PONDS NO.:  [id number]   MEDICAL RECORD NO.:  [id number]          PATIENT TYPE:  OIB   LOCATION:  2923                         FACILITY:  [facility]   PHYSICIAN:  Sheliah Plane, MD    DATE OF BIRTH:  05-05-1961   DATE OF CONSULTATION:  09/15/2007  DATE OF DISCHARGE:                                 CONSULTATION   PRIORITY CONSULTATION   FOLLOWUP CARDIOLOGIST:  Dani Gobble, MD.   PRIMARY CARE PHYSICIAN:  Patrica Duel, MD.   REASON FOR CONSULTATION:  Coronary occlusive disease with unstable  angina.   HISTORY OF PRESENT ILLNESS:  The patient is a 50 year old male with  known coronary occlusive disease having undergone a cardiac  catheterization by Dr. Juanda Chance in October of 2003.  He now has had  episodes of substernal chest pain for more than a year, but notes over  the past several weeks he has been having increasing episodes of  substernal chest pain radiating to the left arm associated with nausea  and diaphoresis.  He has also had exertional shortness of breath that  has been increasing in severity.  For the past four to five months, he  has had episodes of nocturnal dyspnea and episodes of  pounding heart.  The patient also has noted increasing episodes of wheezing and thought  he had a chest cold.  He was seen in [facility] on November 11th and was  admitted today for cardiac catheterization.   PREVIOUS CARDIAC HISTORY:  As noted above, a cath in October of 2003.  At that time, the patient had a de-fib arrest after the injection of the  right coronary artery.  He had a Myoview stress test in the [place]  office in 2006, reportedly negative for ischemia.   CARDIAC RISK FACTORS:  Include a history of hypertension, a history of  hyperlipidemia, denies diabetes, is a current smoker, and has smoked for  many years up over one-and-a-half packs per day.  He has a positive  family history.  Father died of a myocardial infarction at age 64;  mother has had coronary artery bypass grafting.  The patient has had no  previous stroke, does complain of pain in his thighs and calves with  walking, no renal insufficiency.   PAST MEDICAL HISTORY:  1. His chart notes PAF though there is no documented rhythm strips or      delineation of this.  The patient does complain of palpitations and      was told in the past that he missed every fifth beat.  2. History of nephrolithiasis.   PAST SURGICAL HISTORY:  1. Umbilical hernia repair.  2. Stents in his ureters related to kidney stones.   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   MEDICATIONS:  1. Singular 10 mg a day.  2. Benadryl 25 mg a day.  3. Toprol-XL 50 mg a day.  4. Pravachol 20 mg a day.  5. 81 mg of aspirin a day.  6. Lisinopril/hydrochlorothiazide 20/12.5 a day.  7. Nitroglycerin p.r.n.   ALLERGIES:  None known.   CARDIAC REVIEW OF SYSTEMS:  Positive for chest pain, resting shortness  of breath with wheezing, exertional shortness of breath, orthopnea, PND  x several months, history of palpitations.  Denies lower extremity  edema, syncope, or presyncope.   GENERAL REVIEW OF SYSTEMS:  Positive for a 20-pound weight gain;  respiratory is noted above; has had blood in his stool; also hematuria  with kidney stones; does have a history of depression though not  actively being treated currently.   PHYSICAL EXAMINATION:  VITAL SIGNS:  The patient's heart rate is 114,  sinus; blood pressure is 109/71; respiratory rate is 18.  GENERAL:  The patient is an obese, white male, awake and alert and  neurogically intact.  NECK:  He has no carotid bruits.  LUNGS:  Currently, he has no active wheezing.  CARDIAC:  Regular rate and rhythm without murmur or gallop.  ABDOMEN:  An obese abdomen without palpable masses.  The right groin  stick is without significant  hematoma.  He has positive bowel sounds.  EXTREMITIES:  He appears to have adequate vein in both lower extremities  for a bypass; +2 DP and PT pulses bilaterally.   EKG shows a sinus tachycardia without acute changes.   LABORATORY FINDINGS:  White count of 12,000, hematocrit of 50,  hemoglobin of 17, total cholesterol 188, HDL 34, LDL 79, creatinine is  1.0.   Chest x-ray shows a 4 mm right lung mass; it could not be determined if  this was new or old.  The patient is scheduled for a CT scan of the  chest.   Cardiac catheterization films are reviewed, and the patient has a 95%  mid right lesion with a more proximal 80% lesion.  The catheter damp in  the left main has 40% left main obstruction and 70% proximal circumflex,  and 60 to 70% LAD.   Doppler studies were done on the arms.  The radial arteries are the  predominant supplier of both hands bilaterally.  He had no evidence of  carotid stenosis.   IMPRESSION:  A 50 year old male with continued smoking history and a  strong positive family history presents with increasing symptoms of  respiratory and cardiac symptoms and has progression of coronary  occlusive disease since catheterization in 2003, that is not amenable to  angioplasty.  Coronary artery bypass grafting is recommended.  I have  reviewed the risks and options with the patient and his wife, including  the risk of death, infection, stroke, myocardial infarction, bleeding,  and blood transfusion.  I have discussed with them the absolute  essential that he not  smoke anymore.  He is at increased risk of pulmonary complications with  his history of active wheezing and continued smoking up until today.  We  plan for the patient to remain in the hospital and proceed with bypass  surgery on Monday, the 17th, or Tuesday, the 18th.      Sheliah Plane, MD  Electronically Signed     EG/MEDQ  D:  09/15/2007  T:  09/15/2007  Job:  [id number]   cc:   Patrica Duel, M.D.

## 2011-03-16 NOTE — Discharge Summary (Signed)
NAMEJAFARI, MCKILLOP            ACCOUNT NO.:  [id number]   MEDICAL RECORD NO.:  [id number]          PATIENT TYPE:  INP   LOCATION:  2015                         FACILITY:  [facility]   PHYSICIAN:  Sheliah Plane, MD    DATE OF BIRTH:  [birth year]   DATE OF ADMISSION:  09/15/2007  DATE OF DISCHARGE:                               DISCHARGE SUMMARY   FINAL DIAGNOSIS:  Severe three-vessel coronary artery disease.   HOSPITAL DIAGNOSES:  1. Leukocytosis.  2. Acute blood loss anemia postoperatively.  3. Volume overload postoperatively.   SECONDARY DIAGNOSES:  1. History of nephrolithiasis.  2. Hypertension.  3. Hyperlipidemia.  4. Questionable history of paroxysmal atrial fibrillation.  5. Status post umbilical hernia repair.  6. Status post stents in his ureters related to kidney stones.  7. Seasonal allergies.   OPERATIONS/PROCEDURES:  1. Cardiac catheterization with right heart catheterization, left      heart catheterization, coronary angiography, left ventriculogram.  2. Coronary artery bypass grafting x 4 using a left internal mammary      artery to left anterior descending coronary artery, reverse      saphenous vein graft to intermediate coronary arteries, reverse      saphenous vein graft to distal circumflex, reverse saphenous vein      graft to distal right coronary artery.  Right endo vein harvesting      done.   HISTORY/PHYSICAL//HOSPITAL COURSE:  The patient is a 50 year old morbid  obese male weighing 260 pounds, who presented with severe dyspnea and  ongoing angina.  The patient does have history of coronary occlusive  disease which he had undergone a cardiac catheterization by Dr. Juanda Chance  in October 2003.  The patient presented to [facility] with complaints of  substernal chest pain.  He was transferred to [facility] for  cardiac catheterization.  Dr. Shirlee Latch performed cardiac catheterization  September 15, 2007.  This showed severe three-vessel coronary  artery  disease.  Following catheterization, Dr. Tyrone Sage was consulted.  Dr.  Tyrone Sage saw and evaluated the patient.  He discussed with the patient  undergoing coronary artery bypass grafting.  Risks and benefits  discussed.  The patient acknowledged understanding and agreed to  proceed.  Surgery was scheduled for September 18, 2007.  Preoperatively,  the patient had bilateral carotid duplex ultrasound done showing no  significant ICA stenosis.  Preoperatively, the patient noted to have  developed leukocytosis.  He did receive a dose of steroid on admission.  No signs of infection.  CT scan of the chest was done for further  evaluation.  This was within normal limits.  No signs of infection.  Leukocytosis was monitored preoperatively and was trending down prior to  surgery.  For further details of the patient's past medical history and  physical exam as well as preoperative course, please see dictated H&P  and progress notes.   The patient was taken to the operating room on September 18, 2007 where  he underwent coronary bypass grafting x 4 using a left internal mammary  artery to left anterior descending coronary artery, reverse saphenous  vein graft to the intermediate  coronary artery, reverse saphenous vein  graft to the distal circumflex, reverse saphenous vein graft to distal  right coronary artery with right endo vein harvesting done.  The patient  tolerated this procedure well and was transferred to the intensive care  unit in stable condition.  Postoperatively, the patient was noted to be  hemodynamically stable.  He was extubated the evening of surgery.  Post-  extubation, the patient noted to be alert and oriented x 4.  Neuro  intact.  Postoperatively while in the intensive care unit, the patient's  vital signs were followed.  He was able to be weaned from all drips.  The patient's vital signs remained stable.  Following catheterization,  he was in normal sinus rhythm.  Chest  x-ray postoperatively obtain noted  to be clear.  He had minimal drainage.  His chest tubes were  discontinued in normal fashion.  The patient did have slight volume  overload postoperatively and was started on diuretics.  His weight was  monitored daily.   While in the intensive care unit, the patient was out of bed ambulating  well with cardiac rehab.  He was felt to be stable and ready for  transfer to 2000 postop day 2.  While on the telemetry floor, the  patient's vital signs continued to be monitored.  He did have a period  of sinus tachycardia with heart rate in the low 100s.  Blood pressures  also mildly elevated.  His beta blocker was increased and he was  restarted on his ACE inhibitor.  Heart rate and blood pressure improved  after starting these medications.  The patient remained afebrile  postoperatively.  He did require nasal cannula postoperatively.  This  was attempted to be weaned.  Currently, the patient continued to desat  into the 80s on room air with ambulation.  He is currently on 1 liter  nasal cannula.  We will continue to wean the patient off oxygen prior to  discharge home to maintain sats greater than 90%.  We continued to  obtain daily weights during his hospital course.  He was continued on  diuretics on telemetry floor.  The patient's weight was back near  baseline prior to discharge home.  He is still 2.3 kg above his  preoperative weight.  The patient's blood sugars were followed  postoperatively.  He has no history of diabetes mellitus and blood  sugars were stable.  Postoperative blood work also followed.  As stated  above, he did have leukocytosis preoperatively.  This persisted  postoperatively.  No signs of infection noted.  Urinalysis and culture  obtained, and showed to be negative.  Chest x-ray was stable  postoperatively.  Prior to discharge, white blood cell count was  decreasing back to within normal limits.  We will follow up in the a.m.   prior to discharge.  Postoperatively, the patient remained in normal  sinus rhythm.  He continued to use his incentive spirometer daily.  Again, plan is to wean the patient off oxygen with sats greater than  90%.  The patient's incisions were clean, dry, intact and healing well.  He continued to progress well with ambulation working with cardiac  rehab.  He was tolerating diet well.  No nausea or vomiting noted.   LABORATORY DATA:  Postop day 4 showed a white count 15.2, hemoglobin  10.5, hematocrit 30.3, platelet count 292, sodium 138, potassium 3.5,  chloride 103, bicarbonate 25, BUN 22, creatinine 0.84, glucose 110.   The patient  is tentatively ready for discharge home in the next 1-2 days  pending he is able to be weaned off oxygen and remain stable.   FOLLOW-UP APPOINTMENTS:  1. Follow up appointment has been arranged with Dr. Tyrone Sage for      October 19, 2007 at 1 p.m.  The patient will need to obtain a PA      and lateral chest x-ray 30 minutes prior to this appointment.  2. The patient will need to follow up with Dr. Domingo Sep in 2 weeks.      He will need to contact our office to make these arrangements.   ACTIVITY:  Patient instructed no driving till released to do so.  No  lifting over 10 pounds.  He is told to ambulate daily 4 times per day,  progress as tolerated and to continue his breathing exercises.   INCISIONAL CARE:  The patient is told to shower washing his incisions  using soap and water.  He is to contact the office if he develops any  drainage or opening from any of his incision sites.   DIET:  The patient educated on diet to be low-fat, low-salt.   DISCHARGE MEDICATIONS:  1. Aspirin 325 mg daily.  2. Lopressor 37.5 mg b.i.d.  3. Lisinopril 5 mg daily.  4. Pravachol 20 mg daily.  5. Xopenex 45 mcg 2 puffs every 6 hours.  6. Lasix 40 mg daily x 5 days.  7. Potassium chloride 20 mEq daily x 5 days.  8. Singulair 10 mg daily.  9. Oxycodone 5 mg 1-2 tablets  q.4- 6 h. p.r.n. pain.      Theda Belfast, [place]      Sheliah Plane, MD  Electronically Signed    KMD/MEDQ  D:  09/22/2007  T:  09/23/2007  Job:  [id number]   cc:   Sheliah Plane, MD  Dani Gobble, MD  Patrica Duel, M.D.

## 2011-03-16 NOTE — Discharge Summary (Signed)
[name], [name]            ACCOUNT NO.:  [id number]   MEDICAL RECORD NO.:  [id number]          PATIENT TYPE:  INP   LOCATION:  6524                         FACILITY:  [facility]   PHYSICIAN:  Dani Gobble, MD       DATE OF BIRTH:  October 04, 1961   DATE OF ADMISSION:  07/25/2008  DATE OF DISCHARGE:  07/26/2008                               DISCHARGE SUMMARY   DISCHARGE DIAGNOSES:  1. Progressive coronary artery disease.  [name] has a history of      coronary bypass grafting in November 2008 by Dr. Tyrone Sage with left      internal mammary artery graft to his left anterior descending,      saphenous vein graft to his   ramus intermedius, saphenous vein      graft to his distal circumflex as well as saphenous vein graft to      his distal right coronary artery.  He has had a repeat cath in      March 2009 and a trial of medical treatment was pursued, however,      this failed and he continued to have anginal symptoms.  He was sent      to see Dr. Devonne Doughty at [facility], and he had a CAT scan, which      showed a larger completely reversible ischemia in the inferior      region.  His films were reviewed by the interventionist in our      group per Dr. Rod Can request and it was decided that he      should have his circumflex obtuse marginal stented.  He came in      today for elective cath and stenting.  He had a Taxus Liberte      Monorail 2.25 x 32 mm stent placed to his proximal obtuse marginal      1.  Please see Dr. Gaspar Garbe Little's cardiac catheterization note for      complete details.  2. Coronary artery disease.  Cath this admission showed saphenous vein      graft to his right coronary artery 100% saphenous vein graft to his      OD well as graft okay.  His saphenous vein graft to his distal      obtuse marginal had some mid proximal irregularities left internal      mammary artery to his left anterior descending was patent.  He had      native disease.  Left main  showed ostial and proximal 30%-40.      Circumflex, the entire circumflex system was diseased.  The first      obtuse marginal non grafted was 70%-80%, which was stented this      admission.  His left anterior descending had bidirectional flow in      the midportion and the first diagonal was free of disease not      jeopardized other than the other left main.  Right coronary artery      was 100% occluded approximately.  There was large left-to-right      collateral beds that appeared to  arise from the circumflex system.  3. Good Left ventricular systolic function by cath in January 12, 2008.  4. Hyperlipidemia.  5. Hypertension.  6. Morbid obesity.  7. Obstructive sleep apnea, uses a Z-Pak, has been seen by Dr. Shelle Iron.  8. Emphysema by chest CT.  9. Probable obesity hypoventilation syndrome.  10.Tobacco abuse, is committed to quitting at this time.  11.History of paroxysmal atrial fibrillation.  12.Depression.  13.Gastroesophageal reflux disease.   LABS:  Sodium was 138, potassium 3.6, BUN 18, creatinine 1.25.  Hemoglobin 14.5, hematocrit 42.4, WBC 14.5 and platelets 271.  To note,  he did receive steroids because of his allergy to DYE prior to his  cardiac catheterization.  CK-MB 202/122, troponin of 0.01.  This is post  procedure.  Chest x-ray not done during this hospitalization.   DISCHARGE MEDICATIONS:  1. Ranexa 1000 mg twice a day.  2. Pravachol 20 mg every evening.  3. Aspirin increased to 81 mg per day.  4. Nitroglycerin p.r.n.  5. Tramadol p.r.n.  6. Metoprolol decreased to 50 mg twice per day.  7. Albuterol inhaler p.r.n.  8. Lasix 80 mg, on hold.  9. Potassium 20 mEq 2 tablets, on hold.  10.Isosorbide mononitrate 60 mg 1/2 tablet twice a day.  11.Lisinopril 2.5 mg a day.  12.Plavix 75 mg a day.  13.Alprazolam 0.5 p.r.n.  14.Celexa 20 mg a day.  15.Famotidine 20 mg twice per day, which replaces his omeprazole.  If      he still has indigestion, we suggest trying  Protonix or Aciphex,      which do not interact with the Plavix as much as Prilosec or the      other PPI agents.  He was given a prescription for Chantix starter      pack and then 1 mg twice per day.  He was started on this in the      hospital.  He was unsure if he wanted to take this at the time of      discharge.   OTHER DISCHARGE INSTRUCTIONS:  No strenuous activity, lifting, pushing,  pulling, or exercise for a week.  He should do no driving for 1 day and  he may shower.  He has an appointment to see Dr. Domingo Sep on July 06, 2008, at 11:30.   HOSPITAL COURSE:  [name] came in electively for cath and PCI.  He  had been tried as an outpatient for medical therapy with known early  occlusion of graft as an outpatient.  However, this failed.  He had a  PET scan that showed some reversible ischemia in the inferior territory  that it was thought that if he had a PCI to his OM that he would get  more circulation left to right.  This was performed by Dr. Julieanne Manson  on July 25, 2008.  He had a Taxus Liberte stent placed.  It was  2.25 x 32 mm to his OM.  Post procedure, he did well.  He was seen by  cardiac rehab and referred to outpatient cardiac rehab.  He was seen by  tobacco cessation nurse.  He told her that he could quit cold [place], he  had done this for 5-1/2 months until he got depressed.  At the time of  discharge, we did give him a prescription for Chantix to let him decide  if he wanted to continue this.  He had been started on it in the  hospital.  His blood  pressure was slightly low 98/42, thus his Lopressor  was decreased and his Lasix and K-Dur were held.  He will follow up with  Dr. Domingo Sep in the office.      Lezlie Octave, N.P.    ______________________________  Dani Gobble, MD    BB/MEDQ  D:  07/26/2008  T:  07/27/2008  Job:  [id number]

## 2011-03-16 NOTE — Assessment & Plan Note (Signed)
OFFICE VISIT   LUCUS, LAMBERTSON  DOB:  1961/09/25                                        November 09, 2007  CHART #:  [id number]   REASON FOR VISIT:  Followup.   HISTORY OF PRESENT ILLNESS:  Mr. Word is a 50 year old gentleman who  had coronary artery bypass grafting by Dr. Tyrone Sage on November 17.  He  last was seen in the office on December 11 by Dr. Tyrone Sage, at which  time he was complaining of GI symptoms, which were consistent with viral  gastroenteritis.  He did have a negative C. difficile.  He returns today  to check on his progress.  He states that he has still been having  fluttering spells.  He is on a Holter monitor for 30 days.  Dr. Domingo Sep  is monitoring that.  He states that he had an episode this morning of  chest tightness, which lasted about 5 minutes.  He did not take his  nitroglycerin, but the symptoms were consistent with his symptoms from  preoperatively.  He called Dr. Roque Lias office but did not seek  immediate medical attention.  He otherwise has been doing well from a  pain standpoint but has been having a tremendous amount of difficulty  sleeping and feels very anxious.   PHYSICAL EXAMINATION:  GENERAL:  Mr. Malter is a 50 year old white  male who is anxious but in no acute distress.  VITAL SIGNS:  Blood pressure is 124/86, pulse 74, slightly irregular,  respiratory rate is 18, and his oxygen saturation is 97% on room air.  LUNGS:  Clear with equal breath sounds.  CARDIAC:  Regular rate and rhythm. Normal S1 and S2.  No murmur.  His  sternum is well healed.  He has no peripheral edema.   We did a limited EKG in the office here.  There are no apparent acute  ischemic changes, and the patient is currently free of discomfort.   IMPRESSION:  Mr. Beagley is a 50 year old gentleman who is complex with  multiple complaints.  I think he is having a great deal of anxiety, both  related to the atrial flutter as well as the  bypass surgery.  He is  having difficulty sleeping.  I gave him a prescription for Ambien 5 mg  q.h.s. p.r.n.  He has not had any relief with Tylenol PM.  He also is  complaining of continued fluttering.  I am concerned about his chest  discomfort he had this morning although I am not sure whether it might  be more likely to be anxiety related than cardiac in origin, but I did  encourage him that if he were to have this happen again to take his  nitroglycerin immediately.  If he does not experience relief with 2  trials of nitroglycerin, then he should contact medical assistance  immediately.  He also complains of some vague cramping sensation in his  right leg, the leg where the saphenous venectomy was taken when he does  the cardiac rehab; however, the pain is in the thigh and lateral,  away  from the site of surgery, and he has good pulses in that leg and no  significant swelling, so I really do not have an explanation for that  cramping phenomenon either, unless it is just due to deconditioning.  He  is going to be followed up Dr. Domingo Sep regarding the Holter monitoring,  and we will set him up to see Dr. Tyrone Sage next month.   Salvatore Decent Dorris Fetch, M.D.  Electronically Signed   SCH/MEDQ  D:  11/09/2007  T:  11/09/2007  Job:  [id number]   cc:   Patrica Duel, M.D.  Sheliah Plane, MD  Dani Gobble, MD

## 2011-03-16 NOTE — Consult Note (Signed)
[name], FRANCIS            ACCOUNT NO.:  [id number]   MEDICAL RECORD NO.:  [id number]          PATIENT TYPE:  EMS   LOCATION:  ED                            FACILITY:  [facility]   PHYSICIAN:  Kassie Mends, M.D.      DATE OF BIRTH:  14-Dec-1960   DATE OF CONSULTATION:  03/09/2008  DATE OF DISCHARGE:  03/09/2008                                 CONSULTATION   REFERRING PHYSICIAN:  Rhae Lerner. Margretta Ditty, M.D.   REASON FOR CONSULTATION:  Rectal bleeding.   HISTORY OF PRESENT ILLNESS:  Mr. Noxon is a 50 year old male who has  a significant past medical history of   Dictation ended at this point.      Kassie Mends, M.D.  Electronically Signed     SM/MEDQ  D:  03/10/2008  T:  03/10/2008  Job:  [id number]

## 2011-03-16 NOTE — Op Note (Signed)
[name], [name]            ACCOUNT NO.:  [id number]   MEDICAL RECORD NO.:  [id number]          PATIENT TYPE:  AMB   LOCATION:  DAY                           FACILITY:  [facility]   PHYSICIAN:  Kassie Mends, M.D.      DATE OF BIRTH:  03/19/1961   DATE OF PROCEDURE:  03/14/2008  DATE OF DISCHARGE:                               OPERATIVE REPORT   PRIMARY PHYSICIAN:  Madelin Rear. Fusco, MD.   PROCEDURE:  Colonoscopy with cold forceps biopsy.   INDICATIONS FOR EXAM:  Mr. [name] is a 50 year old male who presented  to the emergency department with rectal bleeding.  He has a history of  hemorrhoids.  He never had a colonoscopy.   FINDINGS:  1. A small 3-4 mm transverse colon erosion.  2. Unable to appreciate internal hemorrhoids.  3. No diverticulosis.  4. Otherwise, no polyps or masses.   DIAGNOSIS:  No obvious source for Mr. [name]'s rectal bleeding  identified.  He could have a benign anorectal source.   RECOMMENDATIONS:  1. Screening colonoscopy in 10 years.  2. We will call Mr. [name] with results of his biopsies.  3. He should follow high-fiber diet.  4. He may restart his aspirin on Mar 20, 2008.   MEDICATIONS:  1. Demerol 125 mg IV.  2. Versed 8 mg IV.  3. Phenergan 12.5 mg IV.   PROCEDURE TECHNIQUE:  Physical exam was performed.  Informed consent was  obtained from the patient after explaining the benefits, risks, and  alternatives of the procedure.  The patient was connected to the monitor  and placed in left lateral position.  Continuous oxygen was provided by  nasal cannula and IV medicine administered through an indwelling  cannula.  After administration of sedation and rectal exam, the  patient's rectum was intubated and the scope was advanced under direct  visualization to the cecum.  The patient had an extremely tortuous colon  and had some discomfort during the exam.  He had discomfort with pushing  and with withdrawing the scope.  The scope was removed  slowly by  carefully examining the color, texture, anatomy, and integrity of the  mucosa on the way out.  Cold forceps biopsies were obtained on the way  out.  The patient was recovered in endoscopy and discharged home in  satisfactory condition.   PATH:  Benign colon ulcer likely secondary to ASA, low likelihood of  ischemia. Continue ASA. Monitor rectal bleeding if it persists would  consider repeat exam.      Kassie Mends, M.D.  Electronically Signed     SM/MEDQ  D:  03/14/2008  T:  03/15/2008  Job:  [id number]   cc:   Madelin Rear. Sherwood Gambler, MD  Fax: [phone]

## 2011-03-16 NOTE — Consult Note (Signed)
NAMEZOLTON, DOWSON            ACCOUNT NO.:  [id number]   MEDICAL RECORD NO.:  [id number]          PATIENT TYPE:  AMB   LOCATION:  DAY                           FACILITY:  [facility]   PHYSICIAN:  Kassie Mends, M.D.      DATE OF BIRTH:  [birth year]   DATE OF CONSULTATION:  03/09/2008  DATE OF DISCHARGE:                                 CONSULTATION   REFERRING PHYSICIAN:  Rhae Lerner. Margretta Ditty, M.D.   REASON FOR CONSULTATION:  Rectal bleeding.   HISTORY OF PRESENT ILLNESS:  Mr. Sofranko is a 50 year old male with a  significant past medical history of hemorrhoids.  He was in his usual  state of health until he took his medicine on the morning of the 9th  around 9 a.m.  He began to pass blood and saw clots in the toilet.  Blood was also on the tissue.  He had been hurting on the bottom of his  back, but he denies any abdominal pain, some mild nausea.  He has never  had a colonoscopy.  There is no history of ulcers but he has had reflux.  He states he has an upper endoscopy that was unremarkable in 2007.  He  denies any pain in his rectum but he does have pressure.  He denies any  diarrhea.  He had normal stool this morning.  He denies any gripping or  any pain in his rectum.   PAST MEDICAL HISTORY:  1. Hyperlipidemia.  2. Coronary artery disease.  3. Emphysema.  4. Kidney stones.  5. Hypertension.  He denies any history of diabetes.   PAST SURGICAL HISTORY:  1. CABG x4 with 2 grafts occluded since 2008.  2. Hernia repair.  3. Ureteral stent.   ALLERGIES:  IVP dye and iodine.   MEDICATIONS:  1. Lasix.  2. Metoprolol.  3. Pravachol.  4. Aspirin.  5. Nexium.  6. Nitroglycerin sublingual.  7. Potassium chloride.  8. Lisinopril 2.5 mg daily.  9. Isordil.  10.Ranexa.   FAMILY HISTORY:  He denies any family history of colon cancer or colon  polyps.   SOCIAL HISTORY:  He smokes, and he drinks alcohol.  He is married.  His  phone number is [phone].   REVIEW OF SYSTEMS:  Per  the HPI, otherwise all systems are negative.   PHYSICAL EXAMINATION:  VITAL SIGNS:  Blood pressure 110/74, temperature  99, pulse 73, respiratory rate 20, O2 sat 94% on room air.  GENERAL:  He is in no apparent distress.  Alert and oriented x4.HEENT:  Atraumatic, normocephalic.  Pupils equal and reactive to light.  Mouth:  No oral lesions.  Posterior pharynx without erythema or exudate.  NECK:  Full range of motion.  No lymphadenopathy.LUNGS:  Clear to auscultation  bilaterally.CARDIOVASCULAR:  Regular rhythm.  No murmur.  Normal S1 and  S2.ABDOMEN:  Bowel sounds are present.  Soft, nontender, nondistended.  No rebound or guarding.EXTREMITIES:  Have no cyanosis or edema.  NEUROLOGIC:  He has no focal neurologic deficit.   LABORATORY:  Hemoglobin 16, white count 10, platelets 317.  INR 0.9.   ASSESSMENT:  Mr.  Edler is a 50 year old male with painless rectal  bleeding.  The differential diagnoses include hemorrhoids,  diverticulosis, and a low likelihood of ischemic colitis, polyps, or  colon cancer.   Thank you for allowing me to see Mr. Sudbeck in consultation.  My  recommendations follow:   RECOMMENDATIONS:  1. He declined hospital admission at this time.  His wife was present      for the discussion of benefits versus the risk of his decision.  He      wants to be home for mother's day.  I offered him a colonoscopy on      03/10/2008.  2. He will be discharged per his request.  3. He is asked to hold his aspirin.  4. He is asked to take half of his metoprolol.  5. He will call 911 if large volume bleeding occurs and call [phone]      and notify Dr. Cira Servant.  Otherwise, he will be scheduled as an      outpatient for a colonoscopy on Thursday.      Kassie Mends, M.D.  Electronically Signed     SM/MEDQ  D:  03/10/2008  T:  04/01/2008  Job:  [id number]   cc:   Madelin Rear. Sherwood Gambler, MD  Fax: [phone]

## 2011-03-16 NOTE — Cardiovascular Report (Signed)
NAMEASLAN, HIMES            ACCOUNT NO.:  [id number]   MEDICAL RECORD NO.:  [id number]          PATIENT TYPE:  INP   LOCATION:  6524                         FACILITY:  [facility]   PHYSICIAN:  Thereasa Solo. Little, M.D. DATE OF BIRTH:  11/15/1960   DATE OF PROCEDURE:  07/25/2008  DATE OF DISCHARGE:                            CARDIAC CATHETERIZATION   INDICATIONS FOR TEST:  This 50 year old male had undergone bypass  surgery in December 2008.  He subsequently was readmitted in March with  chest pain and the cath showed loss of the vein graft to the RCA with  collaterals.  In addition to that, he had a OM #1 that was not grafted  and it was moderately diseased.  He was initially trialed on aggressive  medical therapy, but despite that continued to have chest pain to the  point he can no longer work.  He was brought in intervention to the OM  system.   After obtaining informed consent, the patient was prepped and draped in  the usual sterile fashion exposing the right groin.  Following local  anesthetic with 1% Xylocaine, the Seldinger technique was employed and a  6-French introducer sheath was placed in the right femoral artery.  Left  and right coronary arteriography and graft visualization including 3  saphenous vein grafts to the internal mammary artery was performed.  Following this, intervention to the native circumflex and first OM was  performed.   MEDICATIONS:  Angiomax and Versed 2 mg.   ACT:  Prior to intervention was 394.   COMPLICATIONS:  None.   TOTAL CONTRAST:  160 mL.   HEMODYNAMIC MONITORING:  Revealed a central aortic pressure of 90/51.   The patient was given intracoronary nitroglycerin on 2 different  occasions.   CORONARY ARTERIOGRAPHY:  1. Left main showed ostial and proximal 30-40% eccentric narrowing.  2. Circumflex.  The entire circumflex system was diseased.  The      proximal third of the circumflex was diffusely diseased, about 1.75      or  smaller.  The first OM was a nongrafted vessel with hyperlucent      70-80% proximal area of narrowing.  The remainder of the ongoing      circumflex was moderately diseased and the distal OM bed was      grafted.  3. LAD.  The LAD was grafted.  There was bidirectional flow in the      midportion and the first diagonal was free of disease and not      jeopardized other than by the left main.  4. Right coronary artery was 100% occluded proximally.  There was      large left-to-right collateral beds that appeared to rise from the      circumflex system.   GRAFTS:  1. Saphenous vein graft to the RCA.  This graft was 100% occluded and      was cannulated with an RCB catheter.  2. Saphenous vein graft to the optional diagonal.  This graft had      minor proximal irregularities.  The OM system was free of  significant disease.  3. Internal mammary artery to the LAD.  Internal mammary artery was      widely patent.  LAD was widely patent.  4. Saphenous vein graft to the optional diagonal.  This was a large      graft and large optional diagonal and this system was free of      disease.   After verifying that there were no other areas of high-grade stenosis  that were amenable to intervention, arrangements were made for  intervention to the proximal circumflex and proximal OM vessel.  A XB 6-  French 3.5 guide catheter was used.  A short Luge wire was reshaped  several times, but was finally able to be passed down the circumflex  system and down the first OM vessel.  Once this guidewire system was  stable, the guide catheter was well-positioned in the proximal left  main.  A 2.25 x 32 TAXUS stent was then advanced all the way from the  takeoff of the left main down well distal to the obstruction in the  first OM vessel.  The entire area of concern was well covered by this  long stent.  Ten atmospheres for 60 seconds was the stent deployment and  the final inflation was 14 atmospheres  for 45 seconds.   After stent deployment, the entire circumflex and the proximal OM were  hyperexpanded.  Because of this, I did not post dilate this.  There was  brisk TIMI 3 flow post intervention.  There was no evidence of any  dissection or thrombus formation.   Interestingly, the patient did not have any chest pain with stent  deployment.   His Angiomax was discontinued.  He should be ready for discharge to home  tomorrow with followup with Dr. Domingo Sep in about 10 days.   I did discuss with the patient the importance of not smoking and started  him on Chantix.           ______________________________  Thereasa Solo. Little, M.D.     ABL/MEDQ  D:  07/25/2008  T:  07/25/2008  Job:  [id number]   cc:   Dani Gobble, MD  Patrica Duel, M.D.  [facility]

## 2011-03-16 NOTE — Cardiovascular Report (Signed)
NAMEBRAYON, [name]            ACCOUNT NO.:  [id number]   MEDICAL RECORD NO.:  [id number]          PATIENT TYPE:  INP   LOCATION:  6526                         FACILITY:  [facility]   PHYSICIAN:  Madaline Savage, M.D.DATE OF BIRTH:  10/23/1961   DATE OF PROCEDURE:  DATE OF DISCHARGE:                            CARDIAC CATHETERIZATION   PROCEDURES:  1. Selective coronary angiography by Judkins technique.  2. Retrograde left heart catheterization.  3. Left ventricular angiography.  4. Selective visualization of multiple saphenous vein grafts.  5. Selective visualization of the left internal mammary artery graft.  6. Nonselective visualization of the right subclavian and the right      unharvested internal mammary.   INTERVENTIONS:  None.   COMPLICATIONS:  None.   MEDICATIONS GIVEN:  Versed and fentanyl.   PATIENT PROFILE:  The patient is a 50 year old morbidly obese diabetic  gentleman who is a medical patient Dr. Alanda Amass, who was admitted to  the hospital with unstable angina on January 11, 2008. The patient  underwent coronary artery bypass grafting 4 months ago by Dr. Tyrone Sage  and is now having unstable angina.   Additional past history is that the patient has COPD and emphysema,  paroxysmal atrial fibrillation, obstructive sleep apnea treated.  He has  had a ventricular fibrillation arrest 2003, hypertension,  hyperlipidemia, and a right ureteral calculus.  He has previously had  contrast dye ventricular fibrillation during a previous cardiac  catheterization.   RESULTS:  The patient's left ventricular pressure was 115/12, end-  diastolic pressure 17.  Central aortic pressure 115/77 and a mean of 94.  No significant aortic valve gradient by pullback technique.   ANGIOGRAPHIC RESULTS:  The patient's proximal right coronary artery is  100% occluded.  There is an atrial circumflex branch that is 99%  stenosed at its origin.  There is a pair of 75% stenoses in the mid  circumflex coronary artery in an obtuse marginal branch.  There is a 90%  stenosis in a ramus intermediate branch arising near the left main  artery off the LAD.  The right ramus intermediate contains a 90%  stenosis.   There is also noted to be closure of a saphenous vein graft to the RCA.   There is a patent saphenous vein graft to the distal left circumflex  coronary artery that initially is a smooth vein graft, then becomes  severely narrowed in a smooth fashion for greater than 130 cm or more.  It is a long segmental stenosis with one valve in the middle of the area  of tight renarrowing that represents a failing vein graft and impending  closure of that vein graft.  There is an RCA graft with an aorto-ostial  occlusion of 100%.  There is a patent left internal mammary artery graft  to the LAD and a patent saphenous vein graft to the ramus.   Left ventricular angiogram is normal with an ejection fraction of 60%,  no mitral regurgitation, no segmental wall motion abnormalities.  The  left subclavian is patent, as is the internal mammary artery, which is  small.  The right subclavian is  open and the unharvested internal  mammary is open.   FINAL IMPRESSION:  1. Two-graft occlusion in 4 months status post coronary artery bypass      grafting.  2. Morbid obesity, diabetes, obstructive sleep apnea and multiple      other risk factors.  3. Good left ventricular systolic function.  4. Unstable angina.   IMPRESSION:  The patient is a poor candidate for re-bypass grafting.  It  is also not likely that his long segmental stenosis in the saphenous  vein graft to the distal circumflex will be a candidate for  intervention.  I therefore recommend medical therapy with beta-blockers,  nitrates, Ranexa and external enhanced counterpulsation as well as  weight loss.  I will discuss this case with Dr. Alanda Amass, his primary  care cardiologist.           ______________________________   Madaline Savage, M.D.     WHG/MEDQ  D:  01/12/2008  T:  01/13/2008  Job:  [id number]   cc:   Gerlene Burdock A. Alanda Amass, M.D.  Sheliah Plane, MD

## 2011-03-16 NOTE — Cardiovascular Report (Signed)
[name], [name]            ACCOUNT NO.:  [id number]   MEDICAL RECORD NO.:  [id number]          PATIENT TYPE:  INP   LOCATION:  3713                         FACILITY:  [facility]   PHYSICIAN:  Darlin Priestly, MD  DATE OF BIRTH:  [birth year]   DATE OF PROCEDURE:  DATE OF DISCHARGE:                            CARDIAC CATHETERIZATION   DATE OF PROCEDURE:  September 15, 2007.   PROCEDURES:  1. Right heart catheterization.  2. Left heart catheterization.  3. Coronary angiography.  4. Left ventriculogram.   SURGEON:  Darlin Priestly, MD.   COMPLICATIONS:  None.   INDICATIONS:  [name] is a 50 year old male patient of Dr. Kem Boroughs and Dr. Patrica Duel with a history of ongoing tobacco use,  hypertension, hyperlipidemia, history of paroxysmal atrial fibrillation,  who underwent cardiac catheterization October 2003 by Dr. Juanda Chance  revealing scattered disease of the RCA and LAD by report.  His EF then  was noted be 60%.  Over the last several weeks, he has complained of  increasing shortness of breath and chest pain both with rest and  exertion.  Of note, he did have a chest x-ray  revealing a 4 mm nodule  in the right upper lobe which was done on September 13, 2007 at [facility] as part of a preop clearance which has not been further  evaluated.  He is now brought for a cardiac catheterization to evaluate  his CAD and right heart pressure.   DESCRIPTION:  After informed consent, the patient was taken to the  cardiac cath lab where the right groin was shaved, prepped and draped in  the usual sterile fashion.  __________ using modified Seldinger  technique, a number 7-French venous sheath in the right femoral vein and  a number 6 anterior sheath in the right femoral artery. __________  a  number 7-French Swan-Ganz catheter and floated to the RA, RV, PA and  wedge position.  Hemodynamic measures were obtained.   A 6-French diagnostic catheter __________   perform diagnostic  angiography.   The left main is a moderate size vessel with diffuse 30-40% stenosis and  pressure dampening with a 6-French catheter.  This catheter was then  exchanged for a 6-French JL-4 again revealed pressure dampening.   The LAD is a medium-size vessel __________  2 diagonal branch.  The LAD  has scattered 40% disease throughout its proximal mid segment.   Left coronary artery is a medium-size ramus intermedius and bifurcates  distally with no significant disease.   Left circumflex is a medium-size vessel coursing to the AV groove  __________  2 obtuse marginal branches.  The AV circumflex has diffuse  70% disease throughout the ostial and proximal portion.   First OM is a small vessel with no significant disease.   The second OM is a medium-size vessel that bifurcates distally with no  significant disease.   The right coronary is a large vessel which is dominant both PDA as well  as the posterolateral branch.  The RCA is noted to have 80% mid followed  by a 99%  lesion.  The distal RCA, PD and distal branch have no  significant disease.   Left ventriculogram was a preserved EF of 60%.   HEMODYNAMIC RESULTS:  RA 10, RV 39/10, PA 30/18, __________ pressures  19, systemic arterial pressure 113/80, LV system pressure 113/5, LVEDP  of 16, cardiac output 7.8, cardiac index 3.3, PA saturation 63%, AO  saturation 95%.   CONCLUSION:  1. Significant left main two-vessel coronary artery disease.  2. Normal systolic function.  3. Mild pulmonary hypertension.  4. Cardiac output 7.8, cardiac index 3.3.  5. PA saturation 63%, AO saturation 95%.      Darlin Priestly, MD  Electronically Signed     RHM/MEDQ  D:  09/15/2007  T:  09/16/2007  Job:  [id number]   cc:   Kem Boroughs, MD  Patrica Duel, M.D.

## 2011-03-16 NOTE — Assessment & Plan Note (Signed)
OFFICE VISIT   MCDANIEL, OHMS  DOB:  [birth year]                                        October 12, 2007  CHART #:  [id number]   The patient comes to the office today in followup after his coronary  artery bypass grafting done on September 18, 2007.  Postoperatively he  developed increasing cough on December 1 or 2nd.  Chest x-ray done at  [facility] showed some left lower lobe consolidation with he was started  on an empiric course of Avelox and Tussionex.  These symptoms improved.  He has returned today for his regular followup appointment, however,  noticed that approximately 3 or 4 o'clock this morning woke up with  nausea, vomiting and diarrhea.  He had taken extra stool softeners over  the past several days because of constipation but since has had several  episodes of nausea, vomiting and watery diarrhea.  He has had no fever  or chills.   EXAMINATION:  He is nauseated, denies any definite abdominal pain.  His  blood pressure is 131/92, heart rate is 100, respiratory rate is 24, O2  sat is 98% and his temperature is 98.8.  On exam his sternal incision is  healed without drainage.  Lungs:  Clear bilaterally.  Abdominal:  Shows  no abdominal tenderness or abdominal masses.   IMPRESSION:  Patient appears to have viral gastroenteritis possibility,  he does live in [place].  Epidemiologic reports have indicated  high rate of [place] virus in the area.  We will treat him  symptomatically.  Did give him information about care with handwashing  and warning of spread.  Will obtain CBC, BMET and C. diff on his stool.  At this point have given him a prescription for Phenergan 25 mg p.o. q.6  hours p.r.n. for nausea.  He has  an appointment with his medical doctor tomorrow, which he will followup  with his progress.  Plan to see him back in 4 weeks for followup as far  as his cardiac surgery is concerned.   Sheliah Plane, MD  Electronically  Signed   EG/MEDQ  D:  10/12/2007  T:  10/12/2007  Job:  [id number]   cc:   Dr. _____  Patrica Duel, M.D.  Dani Gobble, MD

## 2011-03-16 NOTE — Discharge Summary (Signed)
[name], [name]            ACCOUNT NO.:  [id number]   MEDICAL RECORD NO.:  [id number]          PATIENT TYPE:  INP   LOCATION:  6526                         FACILITY:  [facility]   PHYSICIAN:  Madaline Savage, M.D.DATE OF BIRTH:  [birth year]   DATE OF ADMISSION:  01/11/2008  DATE OF DISCHARGE:  01/13/2008                               DISCHARGE SUMMARY   DISCHARGE DIAGNOSES:  1. Unstable angina, catheterization this admission revealing new      occlusion of a recently-placed right coronary artery graft and a      stenosis in the saphenous vein graft to circumflex.  2. Coronary artery bypass grafting, November 2008.  3. Obesity.  4. Sleep apnea, the patient is to start continuous positive airway      pressure soon.  5. Treated hypertension.  6. Dyslipidemia.  7. Known renal calculus.   HOSPITAL COURSE:  The patient is a 50 year old male followed by Dr.  Domingo Sep and Dr. Nobie Putnam with coronary disease.  He had bypass surgery  in November 2008 with a LIMA to the LAD, SVG to the ramus intermedius,  SVG to the circumflex and RCA.  He has had recurrent chest pain.  He had  an outpatient nuclear study earlier in January that showed some scar  inferobasilarly.  He has had a CT scan that showed no pulmonary embolism  and a V/Q scan that showed no evidence of PE.  He presented January 11, 2008, with recurrent chest pain.  His enzymes were negative.  He was put  on IV heparin and nitrates.  He was taken to the catheterization lab  January 12, 2008, by Dr. Elsie Lincoln.  The SVG to the RCA was occluded at the  ostium.  There was a long segment of 70-80% narrowing in the SVG to the  circumflex.  The LIMA to the LAD was patent and the SVG to the ramus  intermedius was patent.  EF was 60%.  Plan is for continued medical  therapy.  Ranexa and nitrates were added.  The patient hopefully will be  a candidate for EECP as an outpatient.  Dr. Elsie Lincoln feels he can be  discharged January 13, 2008.  He sees Dr.  Domingo Sep in [place] and he  will see her in follow-up in a couple of weeks.  He should not work  until Dr. Domingo Sep clears him to go back to work.   DISCHARGE MEDICATIONS:  1. Pravachol 20 mg a day.  2. Aspirin 325 mg a day.  3. Nitroglycerin sublingual p.r.n.  4. Tussionex 5 mL b.i.d. p.r.n.  5. Tylenol two q.4 h. p.r.n.  6. Metoprolol 100 mg b.i.d.  7. Lasix 40 mg a day.  8. Nexium 40 mg a day.  9. Potassium 20 mEq a day.  10.Ranexa 500 mg twice a day.  11.Isosorbide dinitrate 20 mg twice a day.   LABS:  White count 17.2, hemoglobin 13.8, hematocrit 39.6, platelets  301.  Sodium 137, potassium 4.3, BUN 15, creatinine 1.0.  Liver  functions are normal.  Cholesterol is 166, LDL 89, HDL 26, triglycerides  257.  CK, MB and  troponins  were negative x2.  Hemoglobin A1c is 6.1.  BNP is 31.  TSH is  1.59.  Urinalysis is unremarkable.  INR is 0.9.  EKG shows sinus rhythm  with a Q wave in lead III and small Q-wave in aVF.   DISPOSITION:  The patient is discharged in stable condition and will  follow up Dr. Domingo Sep in [place].      Abelino Derrick, P.A.    ______________________________  Madaline Savage, M.D.    Lenard Lance  D:  01/13/2008  T:  01/14/2008  Job:  [id number]   cc:   Dani Gobble, MD  Patrica Duel, M.D.

## 2011-03-16 NOTE — Discharge Summary (Signed)
NAMEDAIL, LEREW NO.:  [id number]   MEDICAL RECORD NO.:  [id number]          PATIENT TYPE:  INP   LOCATION:  2031                         FACILITY:  [facility]   PHYSICIAN:  Sheliah Plane, MD    DATE OF BIRTH:  Mar 18, 1961   DATE OF ADMISSION:  09/15/2007  DATE OF DISCHARGE:  09/24/2007                               DISCHARGE SUMMARY   ADDENDUM:  Mr. Heinbaugh was tentatively scheduled for discharge home on  September 24, 2007.  As per the previously dictated discharge summary he  progressed well during his hospital course and no acute changes occurred  following the previous dictation.  He was able to be weaned from  supplemental oxygen and at the time of discharge was maintaining O2 sats  of greater than 90% on room air.   His white blood cell count was trending down and he had no fever.  His  blood pressure was improved with an increased dose of beta blocker.  His  labs prior to discharge showed a hemoglobin of 11.1, hematocrit 32,  white count 13.1, platelets 342, sodium 136, potassium 3.6 which was  supplemented, BUN 22, creatinine 0.92.   His chest x-ray was stable.  He was evaluated on morning rounds on  September 24, 2007, postop day #6 and was deemed ready for discharge  home.   DISCHARGE INSTRUCTIONS:  Medications and activity as well as follow-up  appointments are unchanged from the previously dictated discharge  summary.      Coral Ceo, P.A.      Sheliah Plane, MD  Electronically Signed    GC/MEDQ  D:  10/11/2007  T:  10/12/2007  Job:  [id number]   cc:   Dani Gobble, MD  Patrica Duel, M.D.  ?

## 2011-03-16 NOTE — Op Note (Signed)
NAMEMANNY, VITOLO            ACCOUNT NO.:  [id number]   MEDICAL RECORD NO.:  [id number]          PATIENT TYPE:  INP   LOCATION:  2015                         FACILITY:  [facility]   PHYSICIAN:  Sheliah Plane, MD    DATE OF BIRTH:  Jan 18, 1961   DATE OF PROCEDURE:  09/18/2007  DATE OF DISCHARGE:                               OPERATIVE REPORT   PROCEDURE PERFORMED:  Coronary artery bypass grafting x4 with left  internal mammary artery to the left anterior descending coronary artery,  reversed saphenous vein graft to the intermediate coronary artery,  reversed saphenous vein graft to the distal circumflex, reversed  saphenous vein graft to the distal right coronary artery with right  EndoVein harvesting.   SURGEON:  Sheliah Plane, M.D.   FIRST ASSISTANT:  Kerin Perna, M.D.   SECOND ASSISTANT:  Zadie Rhine, P.A.   BRIEF HISTORY:  The patient is a 50 year old morbid obese male weighing  268 pounds who presents with severe dyspnea and ongoing angina. Cardiac  catheterization was performed which demonstrates greater than 90%  stenosis of the mild right coronary artery, circumflex disease 80% with  a small distal circumflex branch and an intermediate branch with 60-70%  LAD. Because of the patient's anatomy and ongoing symptoms, coronary  artery bypass grafting is recommended. The patient is a current smoker  and has significant sleep apnea problems that have not been fully  evaluated.   DESCRIPTION OF PROCEDURE:  With a Swan-Ganz and arterial line monitors  placed, the patient underwent general endotracheal without incident. The  skin of the chest and legs was prepped with Betadine and draped in usual  sterile manner. Using the [employer]ant EndoVein harvest system, vein was  harvested from the right thigh and calf and was of adequate quality and  caliber. Median sternotomy was performed. Left internal mammary artery  was dissected down as a pedicle graft to distal artery, was  divided and  had good free flow. Pericardium was opened. Overall ventricular function  was depressed. The patient had significant evidence of dilatation of the  right ventricle and sluggish contractility of the right ventricle. The  patient was systemically heparinized. Ascending aorta was cannulated.  The right atrium was cannulated, and the aortic root vent cardioplegia  needle was introduced into the ascending aorta.  The patient was placed  on cardiopulmonary bypass 2.4 liters per minute per meter square. Sites  of anastomoses were selected and dissected out of the epicardium. The  patient's body temperature was cooled to 30 degrees. Aortic crossclamp  was applied. Five hundred mL of cold blood potassium cardioplegia was  administered with diastolic arrest of the heart. Myocardial septal  temperature was monitored throughout the crossclamp. Attention was  turned first to the distal right coronary artery which was opened and  was a reasonable sized vessel, admitting a 1.5-mm probe. Using a running  7-0 Prolene, distal anastomosis was performed. Additional cold blood  cardioplegia was administered down the right vein graft. The heart was  then elevated. The distal circumflex was opened. This vessel was much  smaller than thought from the films, and vessel was opened  and admitted  a 1-mm probe distally. Using a running 7-0 Prolene, distal anastomosis  was performed. This vessel probably would not be bypassable again. The  intermittent coronary artery was identified, and intramyocardial vessel  was opened and admitted a 1.5-mm probe.  Using a running 7-0 probe,  distal anastomosis was performed. Additional cold blood cardioplegia was  administered down the vein graft. Attention was then turned to the left  anterior descending coronary artery which was opened and was 1.2 to 1.3  mm in size. Distally, using a running 8-0 Prolene, left internal mammary  artery was anastomosed to the left  anterior descending coronary artery.  With release of the bulldog on the mammary artery, there was rise in  myocardial septal temperature. Bulldog was replaced. With the aortic  crossclamp still in place, each of the 3 vein grafts were anastomosed to  the ascending aorta. Air was evacuated __________ crossclamp was  removed. Total crossclamp time was 86 minutes. Initially, the patient  had evidence of ST elevation. This gradually improved. He was started on  dopamine and milrinone infusion. Sites of anastomoses were inspected and  free of bleeding. The patient was then ventilated and weaned from  cardiopulmonary bypass. He remained hemodynamically stable. The initial  ST segment changes present when the crossclamp was initially removed  gradually resolved. The patient was weaned from cardiopulmonary bypass  and remained hemodynamically stable. He was decannulated in the usual  fashion. Protamine sulfate was administered. Right ventricle appeared to  have much improved ventricular function than preoperatively. Protamine  sulfate was administered. Atrial and ventricular pacing wires were  applied. Graft markers were applied. Pericardium was loosely  reapproximated. Sternum was closed with a #6 stainless steel wire.  Fascia closed with interrupted 0 Vicryl. A left pleural tube and a Blake  mediastinal drain were left in place. The fascia was closed with  interrupted 0 Vicryl, running 3-0 Vicryl subcutaneous tissue, 4-0  subcuticular stitch in skin edges.  Dry dressings were applied. Sponge  and needle count was reported as correct at the completion of the  procedure. The patient tolerated the procedure well without obvious  complication and was transferred to the surgical intensive care unit for  further postoperative care.      Sheliah Plane, MD  Electronically Signed     EG/MEDQ  D:  09/20/2007  T:  09/20/2007  Job:  [id number]   cc:   Dani Gobble, MD

## 2011-03-19 NOTE — Procedures (Signed)
[name], [name]            ACCOUNT NO.:  [id number]   MEDICAL RECORD NO.:  [id number]          PATIENT TYPE:  REC   LOCATION:  RAD                           FACILITY:  [facility]   PHYSICIAN:  Vida Roller, M.D.   DATE OF BIRTH:  03-29-1961   DATE OF PROCEDURE:  10/15/2005  DATE OF DISCHARGE:                                    STRESS TEST   HISTORY:  [name] is a 50 year old with known coronary disease and  catheterization in 2003, which revealed a 50-70% LAD lesion in ventricular  fibrillation arrest requiring cardioversion on catheterization table and  adenosine Cardiolite in November 2004, negative for ischemia.  The patient  now presents for surgical clearance prior to a hernia repair with complaints  of dyspnea on exertion and atypical chest discomfort.  Cardiac risk factors  include tobacco abuse, hypertension and dyslipidemia.   BASELINE DATA:  Electrocardiogram reveals sinus rhythm at 88 beats per  minute.  Blood pressure is 122/72.   The patient exercised for a total of 6 minutes and 50 seconds to Bruce  protocol stage III and 10.1 METS.  Maximal heart rate was 147 beats per  minute which is 83% of predicted maximum.  Maximum blood pressure 182/72 and  resolved down to 148/78 in recovery. EKG revealed no ischemic changes.  No  arrhythmias were noted.  The patient reported claudication bilaterally, left  hip pain and mild shortness of breath.  No chest discomfort was noted.  Exercise was stopped secondary to claudication.   Final images and results are pending MD review.      Jae Dire, P.A. [facility]      Vida Roller, M.D.  Electronically Signed    AB/MEDQ  D:  10/15/2005  T:  10/15/2005  Job:  [id number]

## 2011-03-19 NOTE — Procedures (Signed)
NAME:  [name], [name]                      ACCOUNT NO.:  [id number]   MEDICAL RECORD NO.:  [id number]                   PATIENT TYPE:  INP   LOCATION:  A212                                 FACILITY:  [facility]   PHYSICIAN:  [place] Bing, M.D.               DATE OF BIRTH:  1961/08/17   DATE OF PROCEDURE:  10/02/2003  DATE OF DISCHARGE:                                  ECHOCARDIOGRAM   CLINICAL DATA:  A 50 year old gentleman with chest pain, hypertension, and  possible atrial fibrillation.   M-MODE:  Aorta 3.0.   Left atrium 3.9.   Septum 1.1.   Posterior wall 1.1.   Left ventricular diastole 4.3.   Left ventricular systole 3.3.   IMPRESSION:  1. Technically suboptimal but adequate echocardiographic study.  2. Normal left atrium, right atrium, and right ventricle.  3. Normal mitral, tricuspid, and aortic valves.  4. Normal internal dimension, wall thickness, regional and global function     of the left ventricle.  5. Normal inferior vena cava.  6. Comparison with prior study of August 06, 2002: No significant interval     change.      ___________________________________________                                            [place] Bing, M.D.   RR/MEDQ  D:  10/02/2003  T:  10/02/2003  Job:  [id number]   cc:   Kirk Ruths, M.D.  [address]  [place]  [place] [address]  Fax: [phone]   Vida Roller, M.D.  Fax: [phone]

## 2011-03-19 NOTE — Discharge Summary (Signed)
NAME:  [name], [name]                      ACCOUNT NO.:  [id number]   MEDICAL RECORD NO.:  [id number]                   PATIENT TYPE:  INP   LOCATION:  A212                                 FACILITY:  [facility]   PHYSICIAN:  Kirk Ruths, M.D.            DATE OF BIRTH:  Nov 08, 1960   DATE OF ADMISSION:  10/01/2003  DATE OF DISCHARGE:  10/02/2003                                 DISCHARGE SUMMARY   DISCHARGE DIAGNOSES:  1. Chest pain.  2. History of hypertension.  3. History of hyperlipidemia.  4. History of coronary artery disease.  5. Smoker.  6. Family history of coronary artery disease.   HOSPITAL COURSE:  This 50 year old white male underwent cardiac  catheterization approximately a year before with 50-70% blockage.  The  patient presented with some nonspecific chest pain the evening before  admission.  He was awakened in the night with shortness of breath, pressure  in the chest and a discomfort in the left arm.  He took two aspirin.  Pain  improved, but at the time of my exam in the office he was still having some  discomfort which was relieved by nitroglycerin.  With known coronary artery  disease, history of dyslipidemia, hypertension, tobacco abuse, family  history and obesity, he was admitted to rule out MI.  Patient had been on  Toprol before in the past but had discontinued this medication on his own.  In the hospital, cardiac enzymes were obtained which were negative.  Patient  was seen in consultation by Dr. Dionicio Stall for [facility].  Underwent an echocardiogram which was basically normal and after cardiac  enzymes were negative underwent a stress Cardiolite which again was  negative.  Patient was stable and pain-free and was discharged.  Chest x-ray  was negative.  EKG showed no acute changes and he was discharged home on  baby aspirin daily, Pravachol 20 mg at bedtime, Atenolol 50 mg daily and  Altace 2.5 mg daily.  He will be followed by Dr. Dorethea Clan in  two weeks.  Patient was instructed to stop smoking.     ___________________________________________                                         Kirk Ruths, M.D.   WMM/MEDQ  D:  11/04/2003  T:  11/04/2003  Job:  [id number]

## 2011-03-19 NOTE — H&P (Signed)
[name], [name]            ACCOUNT NO.:  [id number]   MEDICAL RECORD NO.:  [id number]           PATIENT TYPE:  AMB   LOCATION:                                FACILITY:  [facility]   PHYSICIAN:  Dalia Heading, M.D.  DATE OF BIRTH:  1960-11-10   DATE OF ADMISSION:  DATE OF DISCHARGE:  LH                                HISTORY & PHYSICAL   CHIEF COMPLAINT:  Ventral hernia.   HISTORY OF PRESENT ILLNESS:  The patient is a 50 year old white male who is  referred for evaluation and treatment of a ventral hernia. It has been  present for some time but has increased in size recently. It is tender to  touch. It is made worse with straining.   PAST MEDICAL HISTORY:  Chest pain in the past.   PAST SURGICAL HISTORY:  Heart catheterization 2004 which was reportedly  unremarkable.   CURRENT MEDICATIONS:  An aspirin which he is holding.   ALLERGIES:  No known drug allergies.   REVIEW OF SYSTEMS:  The patient smokes a pack and a half of cigarettes a  day. He does drink alcohol socially. He denies any recent chest pain,  shortness of breath, leg swelling, CVA or diabetes mellitus.   PHYSICAL EXAMINATION:  GENERAL:  The patient is a well-developed, well-  nourished, white male in no acute distress.  LUNGS:  Clear to auscultation with equal breath sounds bilaterally.  HEART:  Reveals regular rate and rhythm without S3, S4, or murmurs.  ABDOMEN:  Soft, nontender, nondistended. He is tender in the right upper  paramedian region to palpation where a hernia is present. No  hepatosplenomegaly or masses are noted.   IMPRESSION:  Ventral hernia.   PLAN:  The patient is scheduled for a ventral herniorrhaphy on October 13, 2005. The risks and benefits of the procedure including bleeding, infection  and the possibility of the recurrence of the hernia were fully explained to  the patient, who gave informed consent.      Dalia Heading, M.D.  Electronically Signed     MAJ/MEDQ  D:   10/12/2005  T:  10/12/2005  Job:  [id number]   cc:   [facility]  Fax: [phone]   Kirk Ruths, M.D.  Fax: [phone]

## 2011-03-19 NOTE — Op Note (Signed)
NAMEMARCIN, HOLTE            ACCOUNT NO.:  [id number]   MEDICAL RECORD NO.:  [id number]          PATIENT TYPE:  AMB   LOCATION:  DAY                           FACILITY:  [facility]   PHYSICIAN:  Ky Barban, M.D.DATE OF BIRTH:  August 23, 1961   DATE OF PROCEDURE:  01/21/2006  DATE OF DISCHARGE:  01/21/2006                                 OPERATIVE REPORT   PREOPERATIVE DIAGNOSIS:  Right ureteral stent.   POSTOPERATIVE DIAGNOSIS:  Right ureteral stent.   OPERATION PERFORMED:  Cystoscopy with stent removal.   SURGEON:  Ky Barban, M.D.   ANESTHESIA:  Spinal.   DESCRIPTION OF PROCEDURE:  Patient had spinal anesthesia, usual prep and  drape.  A #25 cystoscope introduced into the bladder.  The stent was  visualized, grasped with the help of a foreign body forceps and removed.  No  complications.  The patient left the operating room in satisfactory  condition.      Ky Barban, M.D.  Electronically Signed     MIJ/MEDQ  D:  01/21/2006  T:  01/26/2006  Job:  [id number]

## 2011-03-19 NOTE — Op Note (Signed)
NAMEROBET, CRUTCHFIELD            ACCOUNT NO.:  [id number]   MEDICAL RECORD NO.:  [id number]          PATIENT TYPE:  AMB   LOCATION:  DAY                           FACILITY:  [facility]   PHYSICIAN:  Dalia Heading, M.D.  DATE OF BIRTH:  1960/12/26   DATE OF PROCEDURE:  10/18/2005  DATE OF DISCHARGE:                                 OPERATIVE REPORT   PREOPERATIVE DIAGNOSIS:  Ventral hernia.   POSTOPERATIVE DIAGNOSIS:  Ventral hernia, incarcerated.   PROCEDURE:  Ventral herniorrhaphy with mesh.   SURGEON:  Dr. Franky Macho.   ANESTHESIA:  General endotracheal.   INDICATIONS:  The patient is a 50 year old white male who presents with a  ventral hernia. Risks and benefits of the procedure including bleeding,  infection, and recurrence of the hernia were fully explained to the patient,  who gave informed consent.   PROCEDURE NOTE:  The patient was placed in a supine position. After  induction of general endotracheal anesthesia, the abdomen was prepped and  draped using the usual sterile technique with Betadine. Surgical site  confirmation was performed.   A midline incision was made in the supraumbilical region. This was taken  down to the hernia. The patient was noted to have omentum that had  incarcerated through a small 1.5-cm defect in the midline fascia. The  omentum was reduced, and a double layer closure of the hernia was performed  using a polypropylene mesh patch. This was placed both posterior and  anterior to the fascial defect. This was secured in place using 2-0 Novofil  interrupted sutures. The subcutaneous layer was reapproximated using a 2-0  Vicryl interrupted suture. Skin was closed using staples. Sensorcaine 0.5%  was instilled into the surrounding wound. Betadine ointment and dry sterile  dressing were applied.   All tape and needle counts were correct at the end of the procedure. The  patient was extubated in the operating room and went back to recovery room  awake in stable condition.   COMPLICATIONS:  None.   SPECIMEN:  None.   BLOOD LOSS:  Minimal.      Dalia Heading, M.D.  Electronically Signed     MAJ/MEDQ  D:  10/18/2005  T:  10/18/2005  Job:  [id number]

## 2011-03-19 NOTE — Consult Note (Signed)
NAME:  [name], [name]                      ACCOUNT NO.:  [id number]   MEDICAL RECORD NO.:  [id number]                   PATIENT TYPE:  INP   LOCATION:  IC09                                 FACILITY:  [facility]   PHYSICIAN:  Vida Roller, M.D.                DATE OF BIRTH:  Jan 04, 1961   DATE OF CONSULTATION:  DATE OF DISCHARGE:                                   CONSULTATION   CONSULTING PHYSICIAN:  Vida Roller, M.D.   PRIMARY CARE [name]:  Patrica Duel, M.D. in [place], [place].   CARDIOLOGIST:  Echo Bing, M.D.   REASON FOR CONSULTATION:  Chest discomfort.   HISTORY OF PRESENT ILLNESS:  [name] is a 50 year old man with known  coronary artery disease, had a cath done in October 2003.  He now presents  with an episode of anterior chest pain, started last night, it awoke him  from sleep.  He describes it as left anterior pressure with radiation to his  left arm, associated with paraesthesia, nausea and diaphoresis with  shortness of breath.  He repots it lasted about an hour.  He took some  aspirin; it got better.  It eventually resolved.  He reported episodes of  chest pain about 1-2 times a week for the last couple of weeks or so both at  rest and with exertion lasting 1-2 minutes, all of which resolved  spontaneously.   PAST MEDICAL HISTORY:  Significant for:  1. Coronary artery disease, status post heart catheterization, in October     2003, with a 50-70% proximal left anterior descending, nonobstructive     disease in the circ and right coronary with a normal ejection fraction.     No renal artery stenosis, no iliac obstruction.  Of note, is the fact     that he had a VF arrest with the injection of the right coronary artery,     requiring a DC cardioversion.  2. He has hyperlipidemia.  3. He has a history of PAF.  4. He has borderline hypertension.  5. Echocardiogram, prior to the catheterization, showed an ejection fraction     of 60%,  hypokinesis of the inferior base with no significant valvular     heart disease.   He lives in [place] with his wife.  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  He is married and has one son who is 40-years-old and healthy.  He smokes  about a pack a day and has for the last 20 years.  He drinks alcohol 1-2  times a week.  He does not do any drugs.   His mother is alive at age 33, but she has had a CABG.  His father died of  an MI at age 46.  He had three brothers and a sister, none of whom have  coronary disease.   REVIEW OF SYSTEMS:  Essentially noncontributory aside from that mentioned in  the history of  the present illness.   He is not allergic to any medications.  He is intolerant to Lipitor; it  gives him muscle cramps.   He takes:  1. Aspirin 81 mg a day.  2. He used to be on Toprol and Lipitor but he stopped those.  He is currently just on aspirin now.   PHYSICAL EXAMINATION:  GENERAL:  He is a well-developed, well-nourished,  moderately obese, white male in no apparent distress.  He is alert and  oriented  x 4.  VITAL SIGNS:  His pulse is 85.  His respiratory rate is 20.  His blood  pressure is 149/100.  He is afebrile.  HEENT:  Unremarkable.  NECK:  Supple.  There is no jugular venous distention or carotid bruits.  CHEST:  Clear to auscultation.  HEART:  Reveals a regular, rate and rhythm with no murmurs or rubs.  He does  have a S4 gallop.  SKIN:  Without lesions.  ABDOMEN:  Obese but soft, nontender with normoactive bowel sounds.  GU:  Deferred.  RECTAL:  Deferred.  EXTREMITIES:  His distal pulses are intact.  He has no bruits noted.  NEUROLOGIC:  Nonfocal.   Chest x-ray is pending.   Electrocardiogram shows a sinus rhythm with normal axes, normal intervals.  He does have Q waves in the inferior wall with nonspecific ST-T wave changes  which is unchanged from his previous electrocardiogram, after his heart  catheterization.   White blood cell count 11.2, H&H  of 16 and 47 with a platelet count of 347.  Sodium of 138, potassium 3.9, chloride 106, bicarb 25, BUN 9, creatinine  0.8.  His blood sugar is 100.  Liver function studies are normal except for  a mildly elevated ALT at 49, normal limit is 48.  Total protein 6.3.  Albumin 3.4.  One set of cardiac enzymes is inconsistent with acute  myocardial infarction.   So, we have a gentleman with known coronary artery disease and chest  discomfort whose chest pain is a little bit atypical for coronary disease;  however, certainly concerning with his known coronary disease.  He was  previously supposed to have a perfusion study, after his heart  catheterization, to assess whether or not the lesion in his anterior wall  was significant or not.  This was never done.  He has hypertension which  appears to be new onset or at least not well controlled and also ongoing  tobacco abuse.  He will need aggressive treatment for his high blood  pressure with beta-blocker and an ACE inhibitor.  We should probably do an  adenosine Cardiolite and an echo.  If the echo shows depressed LV function,  then the adenosine Cardiolite can be forgone, and we will just go with heart  catheterization.  Obviously if he rules in by myocardial infarction with  serial echos, we will need to move towards a heart catheterization as well.      ___________________________________________                                            Vida Roller, M.D.   JH/MEDQ  D:  10/01/2003  T:  10/01/2003  Job:  [id number]

## 2011-03-19 NOTE — Op Note (Signed)
NAMETREYVEN, LAFAUCI            ACCOUNT NO.:  [id number]   MEDICAL RECORD NO.:  [id number]          PATIENT TYPE:  AMB   LOCATION:  DAY                           FACILITY:  [facility]   PHYSICIAN:  Ky Barban, M.D.DATE OF BIRTH:  1961-08-08   DATE OF PROCEDURE:  12/22/2005  DATE OF DISCHARGE:                                 OPERATIVE REPORT   PREOPERATIVE DIAGNOSIS:  Stone, right ureteropelvic junction.   POSTOPERATIVE DIAGNOSIS:  Stone, right ureteropelvic junction.   PROCEDURE:  Insertion of double J stent, size 5 [place] 24-cm.   ANESTHESIA:  General endotracheal.   INDICATIONS:  This patient is having recurrent episodes of right renal  colic, has a 4-mm stone in the right UPJ causing obstruction. The patient is  allergic to IVP dye, so we are not going to use any dye, and I will try to  insert the double J stent without the dye.   PROCEDURE:  The patient was given general endotracheal anesthesia, placed in  lithotomy position. After usual prep and drape, a #25 cystoscope introduced  into the bladder. Bladder was inspected and looks normal. Right ureteral  orifice was then inspected and looks normal. A guidewire was passed up into  the upper ureter. It hangs up at the level of the stone, so I put an open-  ended catheter on top of it. Once the catheter was in the area of the stone,  the guidewire went up into the kidney without any problem. Then, the open-  ended catheter was advanced over the guidewire into the kidney. The  guidewire was removed, and I can see hydronephrotic drip. The guidewire was  reintroduced. The other thing is the guidewire is nicely cuddling up in the  area of the renal pelvis. The open-ended catheter was removed, and under  fluoroscopic control, a double J stent, 5 French 24 cm, is passed up on the  guidewire, and it is positioned between the renal pelvis and the bladder.  Nice loop is obtained in the renal pelvis and the bladder. The guidewire is  removed. The patient left the operating room in satisfactory condition.      Ky Barban, M.D.  Electronically Signed     MIJ/MEDQ  D:  12/22/2005  T:  12/23/2005  Job:  [id number]

## 2011-03-19 NOTE — Procedures (Signed)
NAME:  [name], [name]                      ACCOUNT NO.:  [id number]   MEDICAL RECORD NO.:  [id number]                   PATIENT TYPE:  INP   LOCATION:  A212                                 FACILITY:  [facility]   PHYSICIAN:  Jae Dire, P.A. [facility]               DATE OF BIRTH:  July 12, 1961   DATE OF PROCEDURE:  DATE OF DISCHARGE:                                    STRESS TEST   INDICATION:  [name] is a 50 year old male with known coronary artery  disease.  Cardiac catheterization in October of 2003 revealed 50-70%  proximal LAD stenosis, 30% proximal circumflex stenosis, 40% mid RCA  stenosis, and an ejection fraction of 60%.  He now return with recurrent  chest discomfort and continued cardiac risk factors including tobacco abuse  and untreated lipids.  He has had three sets of cardiac enzymes, all of  which were negative for acute myocardial infarction.   BASELINE DATA:  EKG revealed sinus rhythm at 68 beats per minute with  nonspecific ST abnormalities.  Blood pressure is 130/84.   Adenosine 60 mg was infused over four minute protocol with Cardiolite  injected at three minutes.  The patient reported chest tightness, shortness  of breath and flushing, all of which resolved in recovery.  EKG revealed a  few PAC's and no ischemic changes.  Maximum heart rate was 86 beats per  minute.  Blood pressure decreased with infusion of adenosine down to 112/70.   Final images and results are pending M.D. review.      ___________________________________________                                            Jae Dire, P.A. [facility]   AB/MEDQ  D:  10/02/2003  T:  10/02/2003  Job:  [id number]

## 2011-03-19 NOTE — H&P (Signed)
NAME:  [name], [name]                      ACCOUNT NO.:  [id number]   MEDICAL RECORD NO.:  [id number]                   PATIENT TYPE:  INP   LOCATION:  IC09                                 FACILITY:  [facility]   PHYSICIAN:  Kirk Ruths, M.D.            DATE OF BIRTH:  09-30-1961   DATE OF ADMISSION:  10/01/2003  DATE OF DISCHARGE:                                HISTORY & PHYSICAL   CHIEF COMPLAINT:  Chest pain.   PRESENTING ILLNESS:  This is a 50 year old white male who was awakened in  the night with shortness of breath, pressure on his chest and discomfort in  his left arm.  The patient stated he took two aspirin and started to go to  the emergency room and the pain and discomfort eased up.  The patient  underwent cardiac catheterization approximately one year before this per Dr.  Charlies Constable.  It showed a 50-70% proximal LAD lesion, 30% proximal  circumflex, 40% mid RCA, normal LV function.  The patient also has a history  of dyslipidemia, hypertension, tobacco abuse, family history and obesity.  He is currently taking no medications, although he has been on Toprol and  Lipitor in the past.  The patient incidentally had an intraoperative  ventricular fibrillation during his catheterization last year, requiring  cardioversion, and also had spontaneous paroxysmal atrial fibrillation which  resolved spontaneously.  In the office today, the patient has a normal EKG  with no change since his last tracing.  The patient is feeling much better  but still has some discomfort in his left arm and chest which was relieved  by nitroglycerin.  Due to the patient's past history and response to  nitroglycerin, he will be admitted for rule out MI and cardiology consult.   ALLERGIES:  Allergic to no medications.   MEDICATIONS:  Currently, he only takes aspirin.   SOCIAL HISTORY:  The patient is a smoker with occasional alcohol abuse,  worked at [employer].   PHYSICAL EXAMINATION:   GENERAL:  A well-developed white male in no severe  distress.  VITAL SIGNS:  His pulse is 80 and regular, respirations 20 and unlabored,  blood pressure 140/100.  Weight is 253.  HEENT:  TMs are normal.  Pupils equal to light and accommodation.  Oropharynx benign.  NECK:  Neck is supple without JVD, bruit or thyromegaly.  LUNGS:  Lungs are clear in all areas.  HEART:  Regular sinus rhythm without murmur, gallop or rub.  ABDOMEN:  Abdomen is soft and nontender.  EXTREMITIES:  Extremities without clubbing, cyanosis, or edema.  NEUROLOGIC:  Examination grossly intact.   ASSESSMENT:  1. Chest pain, probable angina.  2. History of hypertension.  3. History of hyperlipidemia.  4. History of coronary artery disease.  5. Smoker.  6. Family history of coronary artery disease.     ___________________________________________  Kirk Ruths, M.D.   WMM/MEDQ  D:  10/01/2003  T:  10/01/2003  Job:  [id number]

## 2011-03-19 NOTE — Cardiovascular Report (Signed)
NAME:  [name], [name]                      ACCOUNT NO.:  [id number]   MEDICAL RECORD NO.:  [id number]                   PATIENT TYPE:  OUT   LOCATION:  RAD                                  FACILITY:  [facility]   PHYSICIAN:  Charlies Constable, MD [facility]                DATE OF BIRTH:  Jul 24, 1961   DATE OF PROCEDURE:  08/15/2002  DATE OF DISCHARGE:                              CARDIAC CATHETERIZATION   CLINICAL HISTORY:  The patient is 50 years old and has no prior history of  known heart disease but he has had chest pain both at rest and with exertion  for more than a year.  His symptoms were somewhat atypical with Dr.  Dietrich Pates, but he had an abnormal stress test with ST depression and he had  an echocardiogram which suggested some posterior wall motion abnormality.  All this was technically somewhat difficult.  Because of this, Dr. Dietrich Pates  arranged for him to come in for a catheterization.  He is a smoker and does  have a positive family history for coronary disease.   DESCRIPTION OF PROCEDURE:  The procedure was performed via the right femoral  artery using an arterial sheath and #6 [place] preformed coronary catheters.  A front wall arterial puncture was performed and Omnipaque contrast was  used.  After the second right coronary injection, the patient developed  ventricular fibrillation.  This required DC cardioversion.  The patient  returned to sinus rhythm.  He briefly lost consciousness but there appeared  to be no apparent sequelae; however, he was in atrial fibrillation following  the cardioversion.   RESULTS:  1. Left main coronary artery:  The left main coronary artery was free of     significant disease.  2. Left anterior descending artery:  The left anterior descending artery had     a 50-70% segmental lesion in the proximal LAD.  The LAD then gave rise to     three diagonal branches and three septal perforators.  These, and the mid     and distal LAD, were free of  significant obstruction.  3. Left circumflex artery:  The left circumflex artery gave rise to an     intermedius branch, a marginal branch, and two posterolateral branches.     There was 30% narrowing in the proximal circumflex artery.  4. Right coronary artery:  The right coronary artery was a moderate-sized     vessel that gave rise to a right ventricular branch, a posterior     descending branch, and two posterolateral branches.  There was 40%     narrowing in the mid vessel.  5. Left ventriculogram:  The left ventriculogram performed in the RAO     projection showed good wall motion but no areas of hypokinesis.  The     estimated ejection fraction was 60%.  The left ventriculogram in the LAO     projection showed good wall  motion with no area of hypokinesis.  6. Distal aortogram:  A distal aortogram was performed which showed patent     renal arteries and no significant iliac obstruction.   HEMODYNAMIC DATA:  1. The aortic pressure was 129/91 with a mean of 109.  2. Left ventricular pressure was 129/20.   CONCLUSION:  1. Coronary artery disease which is probably nonobstructive with 50-70%     narrowing in the proximal left anterior descending artery, 30% narrowing     in the proximal right coronary artery, and 40% narrowing in the mid right     coronary artery with normal left ventricular function.  2. Ventricular fibrillation following second right coronary injection     treated with direct current cardioversion.   RECOMMENDATIONS:  The patient's coronary disease appears to be  nonobstructive and his symptoms are somewhat atypical.  I do have some  concern about the LAD.  I would recommend that we do a rest stress  Cardiolite scan to evaluate this since he only had a stress ECG.  Because of  the ventricular fibrillation and because of the persistent atrial  fibrillation, we will plan to keep him in the hospital today on telemetry.  The atrial fibrillation could play some role in  his symptoms as he has  symptoms of palpitations.  We will start him on a beta blocker for this.  He  also needs secondary risk factor modification including discontinuation of  cigarettes; aspirin and a statin as well as diet and exercise.                                               Charlies Constable, MD [facility]    BB/MEDQ  D:  08/15/2002  T:  08/15/2002  Job:  [id number]   cc:   Patrica Duel, MD  [address]  [place]  [place] [address]  Fax: [phone]   Benld Bing, M.D. [facility]  [address]  [place]  [place] [address]  Fax: 1   Cardiac Catheterization Lab

## 2011-03-19 NOTE — H&P (Signed)
[name], [name]            ACCOUNT NO.:  [id number]   MEDICAL RECORD NO.:  [id number]          PATIENT TYPE:  AMB   LOCATION:  DAY                           [facility]ILITY[facility]  [facility]   PHYSICIAN:  Ky Barban, M.D.DATE OF BIRTH:  September 12, 1961   DATE OF ADMISSION:  DATE OF DISCHARGE:  LH                                HISTORY & PHYSICAL   CHIEF COMPLAINT:  Right ureteral calculus and right double-J stent.   HISTORY OF PRESENT ILLNESS:  This 50 year old gentleman is being followed by  me since about a month ago when he was having pain in his right flank and he  was found to have 4 mm stone in the right UPJ.  Subsequently I put in a  double-J stent.  I tried to get the stent out but I was unable to grab the  stent in the bladder.  He was very uncomfortable so I decided to do it under  anesthesia.  He will coming as an outpatient to undergo removal of the stent  as an outpatient.  He is aware that the stone is still there.  Hopefully, it  is 4 mm stone.  Hopefully, it comes out.   PAST SURGICAL HISTORY:  Refer to his old record.   PHYSICAL EXAMINATION:  GENERAL:  Moderately obese, not in acute distress.  Fully conscious, alert, oriented.  VITAL SIGNS:  Blood pressure 130/80, temperature normal.  CENTRAL NERVOUS SYSTEM:  Negative .  HEENT:  Negative.  CHEST:  Symmetrical.  HEART:  Regular sinus rhythm.  ABDOMEN:  Soft, flat.  Liver, spleen, kidneys not palpable.  No CVA  tenderness.  EXTERNAL GENITALIA:  Unremarkable.   IMPRESSION:  Right ureteral stone and right ureteral stent.   PLAN:  Cystoscopy with stent removal under anesthesia as an outpatient.      Ky Barban, M.D.  Electronically Signed     MIJ/MEDQ  D:  01/20/2006  T:  01/20/2006  Job:  [id number]

## 2011-03-19 NOTE — Procedures (Signed)
   NAME:  RICHARDSON, DUBREE                      ACCOUNT NO.:  [id number]   MEDICAL RECORD NO.:  [id number]                   PATIENT TYPE:  OUT   LOCATION:  RAD                                  [facility]ILITY:  [facility]   PHYSICIAN:  [place] Bing, MD [facility]             DATE OF BIRTH:  1961/03/12   DATE OF PROCEDURE:  08/06/2002  DATE OF DISCHARGE:  08/06/2002                                  ECHOCARDIOGRAM   CLINICAL DATA:  A 50 year old gentleman with chest pain.   IMPRESSION:  1. A technically difficult, but adequate echocardiographic study.  2. Normal left atrium, right atrium, and right ventricle.  3. Normal mitral valve with mild annular calcification.  4. Normal aortic valve.  5. Normal tricuspid and pulmonic valves.  6. Normal internal dimension of the left ventricle; bordering LVH.     Hypokinesis to akinesis at the base of the posterior wall with preserved     overall LV systolic function.  The estimated ejection fraction is 0.55-     0.60.  7. Normal IVC.                                               [place] Bing, MD [facility]    RR/MEDQ  D:  08/06/2002  T:  08/08/2002  Job:  [id number]

## 2011-03-19 NOTE — H&P (Signed)
[name], [name]            ACCOUNT NO.:  [id number]   MEDICAL RECORD NO.:  [id number]           PATIENT TYPE:  AMB   LOCATION:                                [facility]   PHYSICIAN:  Ky Barban, M.D.DATE OF BIRTH:  [birth year]   DATE OF ADMISSION:  12/22/2005  DATE OF DISCHARGE:  LH                                HISTORY & PHYSICAL   CHIEF COMPLAINT:  Pain both flanks for 5 weeks.   A 50 year old gentleman referred to me by Dr. Regino Schultze because of a stone in  the right upper ureter.  He is having pain which is colic like.  He says is  happening in both flanks, goes forward, crosses the private area.  The pain  comes and goes but never leaves completely.  CT scan shows there is a 4 mm  stone in the right UPJ causing partial obstruction.  There are 1 to 2 mm  size stones in both kidneys. No history of kidney stones in the past, no  history of fever or gross hematuria.  He does have some chills.   PAST MEDICAL HISTORY:  1.  He says the pain started right after he had hiatal hernia surgery done      December of last year.  2.  Hypertension for which he is taking medication.  3.  Cardiac catheterization done last year and apparently had cardiac      arrest.  All the workup subsequently showed no pathology which he says      he was told was because of IVP dye.  4.  He also had bilateral vasectomy in 1992.   PERSONAL HISTORY:  Does smoke and also drinks.   REVIEW OF SYSTEMS:  Unremarkable.  AUS code is 29.  He also told me that he  had problem with his back.  He had a slipped lumbar disk, but it did not  give him any problem until this whole thing started; now he is also having  back ache.  For the last several days, he also has seen some lowered stools.   PHYSICAL EXAMINATION:  GENERAL:  Moderately obese.  VITAL SIGNS:  Blood pressure 140/98, temperature 98.4.  ABDOMEN: Soft.  Liver, spleen, kidneys not palpable.  Mild tenderness right  CVA.  EXTERNAL GENITALIA:  Circumcised, testicles are normal.  RECTAL:  Sphincter tone is normal, no rectal mass.  Prostate smooth and  firm.   IMPRESSION:  1.  Right ureteral calculus.  2.  Hypertension.   I told him that I am not going to do the IVP dye, but I can go ahead and put  a double-J stent, and if the stone is visible on KUB, then we can do  lithotripsy.  Procedure, its limitations and complications discussed.  He  understands.  Will come back tomorrow as outpatient to have it done.      Ky Barban, M.D.  Electronically Signed     MIJ/MEDQ  D:  12/21/2005  T:  12/21/2005  Job:  [id number]   cc:   Kirk Ruths, M.D.  Fax: [phone]

## 2011-03-19 NOTE — Discharge Summary (Signed)
NAME:  [name], [name]                      ACCOUNT NO.:  [id number]   MEDICAL RECORD NO.:  [id number]                   [name] TYPE:  OIB   LOCATION:  4715                                 FACILITY:  [facility]   PHYSICIAN:  Charlies Constable, MD [facility]                DATE OF BIRTH:  12-10-1960   DATE OF ADMISSION:  08/15/2002  DATE OF DISCHARGE:  08/16/2002                           DISCHARGE SUMMARY - REFERRING   SUMMARY OF HISTORY:  [name] is a 50 year old white male, who was  referred to Dr. Marvel Plan office on August 06, 2002, for intermittent chest  discomfort.  [name] describes episodic left-sided chest discomfort,  radiating to Ronnie left arm for Ronnie preceding two years.  He stated initially  these were infrequent, but they have increased in frequency and occur up to  2-3 times per week.  He has not noticed any relationship to exertion.  He  had sudden onset of sharp discomfort with radiation to Ronnie left elbow with  occasional palpitations and dyspnea.  He feels that Ronnie symptoms resolve in  a matter of seconds; however, his wife indicates that they seem to be more  prolonged.  His history is notable for vasectomy, borderline hypertension  without medication.  He is overweight, history of mild hyperlipidemia and  tobacco use.   LABORATORY DATA:  Preadmission H&H was 16.3 and 47.5, normal indices,  platelets 352, WBC 12.4, PT 12.7, PTT was not performed.  Sodium 141,  potassium 3.8, BUN 10, creatinine 1.2.  His chest x-ray showed bronchitic  and interstitial changes of uncertain acuity, normal heart size.  EKG in Ronnie  office showed normal sinus rhythm.  EKG at [facility] hospital showed atrial  fibrillation, normal axis, early R wave, nonspecific ST-T wave changes.   HOSPITAL COURSE:  [name] was brought in for an outpatient cardiac  catheterization.  This was performed on August 15, 2002 by Dr. Juanda Chance.  According to his progress note, he had EF of 60% without wall motion  abnormalities.  He had a proximal 50-70% LAD, 30% proximal circumflex, 40%  mid RCA.  Post sheath removal and bed rest, he was ambulating without  difficulty, and catheterization site was intact.  Dr. Juanda Chance wanted him to  remain overnight secondary to Ronnie arrythmia.  He was initially placed on a  Cardizem drip secondary to increased ventricular rate; however, this was  discontinued, and he was placed on an increased dose of beta blocker.  By  August 16, 2002, Dr. Juanda Chance reviewed Ronnie chart, and [name] stated that  he wanted to go home.  Dr. Juanda Chance felt that Ronnie PAF may be contributing to  his symptoms.  It was noted that his ventricular rate was controlled.  Telemetry continued to show atrial fibrillation at Ronnie time of discharge  with pauses ranging 1.7-2.4 second pauses while sleeping, and he was  asymptomatic with this.  After reviewing, Dr. Juanda Chance felt that  he could be  discharged home without Coumadin therapy since he was at low risk for CVA or  complications.   DISCHARGE DIAGNOSES:  1. Nonobstructive coronary artery disease.  2. Atrial fibrillation, ventricular rate now controlled.  3. Tobacco use.  4. Hypertension.  5. Hyperlipidemia.   DISPOSITION:  He is discharged home.  It is noted at Ronnie time of discharge,  lipid panel that was done at [facility] is pending this dictation.   DISCHARGE MEDICATIONS:  1. Aspirin 81 mg q.d.  2. Toprol XL 150 mg q.d.  3. Lipitor 40 mg q.h.s.   DISCHARGE INSTRUCTIONS:  He was advised no lifting, driving, sexual  activity, or heavy exertion for two days and given a note to return to work  on Monday.  He was asked to maintain a low salt/fat/cholesterol diet.  If he  had any problems with his catheterization site, he will call.  He was  advised no smoking or tobacco products.  He will need fasting lipids and  LFTs in approximately six weeks.  When he returns to Ronnie office, he will  also need an EKG and follow up on Ronnie lipid panel that  was done in Ronnie  hospital.  Our office will call him with an appointment at home.  He was  asked to call Ronnie hospital to arrange smoking cessation program, and he will  call on his on in regard to a nutritional consult per Ronnie wife's request.     Arlin Savona DICTATOR                          Charlies Constable, MD [facility]    DD/MEDQ  D:  08/16/2002  T:  08/16/2002  Job:  [id number]   cc:   Patrica Duel, MD  [address]  [place]  [place] [address]  Fax: [phone]   Corning Bing, M.D. [facility]  [address]  [place]  [place] [address]  Fax: [phone]

## 2011-04-16 ENCOUNTER — Emergency Department (HOSPITAL_COMMUNITY)
Admission: EM | Admit: 2011-04-16 | Discharge: 2011-04-16 | Discharge status: Against Medical Advice | Payer: 59 | Attending: Emergency Medicine | Admitting: Emergency Medicine

## 2011-04-16 ENCOUNTER — Emergency Department (HOSPITAL_COMMUNITY): Payer: 59

## 2011-04-16 DIAGNOSIS — R109 Unspecified abdominal pain: Secondary | ICD-10-CM | POA: Insufficient documentation

## 2011-04-16 DIAGNOSIS — I10 Essential (primary) hypertension: Secondary | ICD-10-CM | POA: Insufficient documentation

## 2011-04-16 DIAGNOSIS — I2510 Atherosclerotic heart disease of native coronary artery without angina pectoris: Secondary | ICD-10-CM | POA: Insufficient documentation

## 2011-04-16 DIAGNOSIS — R0989 Other specified symptoms and signs involving the circulatory and respiratory systems: Secondary | ICD-10-CM | POA: Insufficient documentation

## 2011-04-16 DIAGNOSIS — Z951 Presence of aortocoronary bypass graft: Secondary | ICD-10-CM | POA: Insufficient documentation

## 2011-04-16 DIAGNOSIS — R0609 Other forms of dyspnea: Secondary | ICD-10-CM | POA: Insufficient documentation

## 2011-04-16 DIAGNOSIS — E785 Hyperlipidemia, unspecified: Secondary | ICD-10-CM | POA: Insufficient documentation

## 2011-04-16 DIAGNOSIS — R079 Chest pain, unspecified: Secondary | ICD-10-CM | POA: Insufficient documentation

## 2011-04-16 DIAGNOSIS — R197 Diarrhea, unspecified: Secondary | ICD-10-CM | POA: Insufficient documentation

## 2011-04-16 DIAGNOSIS — R209 Unspecified disturbances of skin sensation: Secondary | ICD-10-CM | POA: Insufficient documentation

## 2011-04-16 LAB — LIPASE, BLOOD: Lipase: 16 U/L (ref 11–59)

## 2011-04-16 LAB — BASIC METABOLIC PANEL
BUN: 9 mg/dL (ref 6–23)
Calcium: 9.8 mg/dL (ref 8.4–10.5)
Creatinine, Ser: 1.14 mg/dL (ref 0.50–1.35)
GFR calc Af Amer: >60 mL/min (ref >60)
GFR calc non Af Amer: >60 mL/min (ref >60)

## 2011-04-16 LAB — DIFFERENTIAL
Basophils Absolute: 0.1 10*3/uL (ref 0.0–0.1)
Eosinophils Absolute: 0.6 10*3/uL (ref 0.0–0.7)
Eosinophils Relative: 7 % — ABNORMAL HIGH (ref 0–5)
Lymphocytes Relative: 30 % (ref 12–46)
Neutrophils Relative %: 52 % (ref 43–77)

## 2011-04-16 LAB — CBC
Platelets: 281 10*3/uL (ref 150–400)
RBC: 4.61 MIL/uL (ref 4.22–5.81)
RDW: 12.8 % (ref 11.5–15.5)
WBC: 9.1 10*3/uL (ref 4.0–10.5)

## 2011-04-16 LAB — CK TOTAL AND CKMB (NOT AT ARMC)
CK, MB: 2 ng/mL (ref 0.3–4.0)
CK, MB: 1.9 ng/mL (ref 0.3–4.0)
Relative Index: 1.6 (ref 0.0–2.5)
Total CK: 118 U/L (ref 7–232)

## 2011-04-16 LAB — HEPATIC FUNCTION PANEL
Bilirubin, Direct: 0.1 mg/dL (ref 0.0–0.3)
Indirect Bilirubin: 0.2 mg/dL — ABNORMAL LOW (ref 0.3–0.9)

## 2011-07-26 LAB — HEPARIN LEVEL (UNFRACTIONATED)
Heparin Unfractionated: <0.1 — ABNORMAL LOW
Heparin Unfractionated: <0.1 — ABNORMAL LOW

## 2011-07-26 LAB — MAGNESIUM: Magnesium: 2.2

## 2011-07-26 LAB — PROTIME-INR
INR: 0.9
INR: 1
Prothrombin Time: 12.3

## 2011-07-26 LAB — CBC
MCHC: 34.6
MCHC: 34.6
MCV: 88
MCV: 88.2
Platelets: 296
Platelets: 301
Platelets: 315
RBC: 4.49
RBC: 4.94
RDW: 14.4
WBC: 17.2 — ABNORMAL HIGH

## 2011-07-26 LAB — BASIC METABOLIC PANEL
BUN: 14
BUN: 15
CO2: 27
Calcium: 8.9
Chloride: 106
Creatinine, Ser: 1.06
Creatinine, Ser: 1.11
GFR calc Af Amer: >60
GFR calc non Af Amer: >60
GFR calc non Af Amer: >60
Glucose, Bld: 118 — ABNORMAL HIGH
Potassium: 4.3

## 2011-07-26 LAB — CK TOTAL AND CKMB (NOT AT ARMC)
CK, MB: 2
Relative Index: INVALID

## 2011-07-26 LAB — AMYLASE: Amylase: 49

## 2011-07-26 LAB — COMPREHENSIVE METABOLIC PANEL
AST: 24
Albumin: 3.9
CO2: 23
Calcium: 9
Creatinine, Ser: 0.89
GFR calc Af Amer: >60
GFR calc non Af Amer: >60

## 2011-07-26 LAB — URINALYSIS, ROUTINE W REFLEX MICROSCOPIC
Glucose, UA: NEGATIVE
Ketones, ur: NEGATIVE
Specific Gravity, Urine: 1.023
pH: 5.5

## 2011-07-26 LAB — CARDIAC PANEL(CRET KIN+CKTOT+MB+TROPI)
Relative Index: INVALID
Total CK: 81
Troponin I: 0.03

## 2011-07-26 LAB — TROPONIN I: Troponin I: 0.01

## 2011-07-26 LAB — DIFFERENTIAL
Eosinophils Relative: 3
Lymphocytes Relative: 32
Lymphs Abs: 3.5
Neutro Abs: 6

## 2011-07-26 LAB — LIPID PANEL
Total CHOL/HDL Ratio: 6.4
VLDL: 51 — ABNORMAL HIGH

## 2011-07-26 LAB — APTT: aPTT: 32

## 2011-08-02 LAB — CARDIAC PANEL(CRET KIN+CKTOT+MB+TROPI)
CK, MB: 1.2
Relative Index: 0.6
Total CK: 202

## 2011-08-02 LAB — BASIC METABOLIC PANEL
BUN: 18
Calcium: 8.8
Creatinine, Ser: 1.25
GFR calc non Af Amer: >60
Glucose, Bld: 108 — ABNORMAL HIGH
Potassium: 3.6

## 2011-08-02 LAB — CBC
MCHC: 34.3
RDW: 12.9

## 2011-08-06 ENCOUNTER — Encounter: Payer: Self-pay | Admitting: Gastroenterology

## 2011-08-10 LAB — URINALYSIS, ROUTINE W REFLEX MICROSCOPIC
Bilirubin Urine: NEGATIVE
Glucose, UA: NEGATIVE
Glucose, UA: NEGATIVE
Hgb urine dipstick: NEGATIVE
Ketones, ur: NEGATIVE
Ketones, ur: NEGATIVE
Nitrite: NEGATIVE
Nitrite: NEGATIVE
Protein, ur: NEGATIVE
Protein, ur: NEGATIVE
Specific Gravity, Urine: 1.027
Urobilinogen, UA: 1
pH: 7.5

## 2011-08-10 LAB — MAGNESIUM
Magnesium: 2.2
Magnesium: 2.4
Magnesium: 2.8 — ABNORMAL HIGH

## 2011-08-10 LAB — POCT I-STAT 3, ART BLOOD GAS (G3+)
Acid-base deficit: 1
Acid-base deficit: 2
Bicarbonate: 24.6 — ABNORMAL HIGH
Bicarbonate: 26.3 — ABNORMAL HIGH
O2 Saturation: 92
O2 Saturation: 94
Operator id: 3406
Patient temperature: 37
TCO2: 24
TCO2: 26
pCO2 arterial: 40.6
pCO2 arterial: 52 — ABNORMAL HIGH
pCO2 arterial: 33.1 — ABNORMAL LOW
pH, Arterial: 7.311 — ABNORMAL LOW
pH, Arterial: 7.331 — ABNORMAL LOW
pH, Arterial: 7.438
pO2, Arterial: 227 — ABNORMAL HIGH
pO2, Arterial: 77 — ABNORMAL LOW

## 2011-08-10 LAB — DIFFERENTIAL
Basophils Absolute: 0
Basophils Absolute: 0.1
Basophils Relative: 0
Basophils Relative: 0
Eosinophils Absolute: 0 — ABNORMAL LOW
Eosinophils Absolute: 0.5
Eosinophils Absolute: 0.7
Eosinophils Relative: 3
Eosinophils Relative: 4
Lymphocytes Relative: 17
Lymphocytes Relative: 42
Lymphocytes Relative: 9 — ABNORMAL LOW
Lymphs Abs: 2.2
Lymphs Abs: 2.5
Lymphs Abs: 7.4 — ABNORMAL HIGH
Monocytes Absolute: 1.4 — ABNORMAL HIGH
Monocytes Absolute: 1.4 — ABNORMAL HIGH
Monocytes Relative: 10
Monocytes Relative: 4
Monocytes Relative: 8
Neutro Abs: 10.5 — ABNORMAL HIGH
Neutro Abs: 22.2 — ABNORMAL HIGH
Neutro Abs: 8.2 — ABNORMAL HIGH
Neutrophils Relative %: 47
Neutrophils Relative %: 69
Neutrophils Relative %: 88 — ABNORMAL HIGH

## 2011-08-10 LAB — CREATININE, SERUM
Creatinine, Ser: 0.91
Creatinine, Ser: 1.02
GFR calc Af Amer: >60
GFR calc Af Amer: >60
GFR calc non Af Amer: >60
GFR calc non Af Amer: >60

## 2011-08-10 LAB — I-STAT EC8
BUN: 12
Bicarbonate: 21.8
Chloride: 106
Glucose, Bld: 118 — ABNORMAL HIGH
Glucose, Bld: 98
HCT: 34 — ABNORMAL LOW
Hemoglobin: 11.2 — ABNORMAL LOW
Hemoglobin: 11.6 — ABNORMAL LOW
Operator id: 113031
Sodium: 135
Sodium: 139
TCO2: 23

## 2011-08-10 LAB — TYPE AND SCREEN
ABO/RH(D): O POS
Antibody Screen: NEGATIVE

## 2011-08-10 LAB — BASIC METABOLIC PANEL
BUN: 11
BUN: 13
BUN: 13
BUN: 17
BUN: 22
BUN: 22
CO2: 22
CO2: 23
CO2: 25
CO2: 25
CO2: 27
CO2: 28
Calcium: 7.7 — ABNORMAL LOW
Calcium: 8.4
Calcium: 8.4
Calcium: 8.7
Calcium: 8.8
Calcium: 9.1
Calcium: 9.4
Chloride: 100
Chloride: 102
Chloride: 103
Chloride: 104
Chloride: 105
Chloride: 107
Creatinine, Ser: 0.84
Creatinine, Ser: 0.92
Creatinine, Ser: 0.93
Creatinine, Ser: 0.95
Creatinine, Ser: 1.02
Creatinine, Ser: 1.04
GFR calc Af Amer: >60
GFR calc Af Amer: >60
GFR calc Af Amer: >60
GFR calc Af Amer: >60
GFR calc Af Amer: >60
GFR calc Af Amer: >60
GFR calc Af Amer: >60
GFR calc Af Amer: >60
GFR calc non Af Amer: >60
GFR calc non Af Amer: >60
GFR calc non Af Amer: >60
GFR calc non Af Amer: >60
GFR calc non Af Amer: >60
GFR calc non Af Amer: >60
GFR calc non Af Amer: >60
GFR calc non Af Amer: >60
Glucose, Bld: 110 — ABNORMAL HIGH
Glucose, Bld: 123 — ABNORMAL HIGH
Glucose, Bld: 130 — ABNORMAL HIGH
Glucose, Bld: 135 — ABNORMAL HIGH
Glucose, Bld: 138 — ABNORMAL HIGH
Glucose, Bld: 199 — ABNORMAL HIGH
Glucose, Bld: 98
Potassium: 3.5
Potassium: 3.6
Potassium: 3.7
Potassium: 3.8
Potassium: 3.9
Potassium: 3.9
Potassium: 4.2
Sodium: 133 — ABNORMAL LOW
Sodium: 133 — ABNORMAL LOW
Sodium: 133 — ABNORMAL LOW
Sodium: 135
Sodium: 136
Sodium: 137
Sodium: 138
Sodium: 140

## 2011-08-10 LAB — B-NATRIURETIC PEPTIDE (CONVERTED LAB): Pro B Natriuretic peptide (BNP): 35

## 2011-08-10 LAB — CBC
HCT: 30.1 — ABNORMAL LOW
HCT: 30.3 — ABNORMAL LOW
HCT: 31.8 — ABNORMAL LOW
HCT: 31.9 — ABNORMAL LOW
HCT: 32 — ABNORMAL LOW
HCT: 32.3 — ABNORMAL LOW
HCT: 33.6 — ABNORMAL LOW
HCT: 34.1 — ABNORMAL LOW
HCT: 43.4
HCT: 43.9
Hemoglobin: 10.4 — ABNORMAL LOW
Hemoglobin: 10.5 — ABNORMAL LOW
Hemoglobin: 10.9 — ABNORMAL LOW
Hemoglobin: 11 — ABNORMAL LOW
Hemoglobin: 11.1 — ABNORMAL LOW
Hemoglobin: 11.3 — ABNORMAL LOW
Hemoglobin: 11.6 — ABNORMAL LOW
Hemoglobin: 11.8 — ABNORMAL LOW
Hemoglobin: 14.6
Hemoglobin: 14.9
Hemoglobin: 15.3
MCHC: 34.2
MCHC: 34.4
MCHC: 34.5
MCHC: 34.6
MCHC: 34.7
MCHC: 34.7
MCHC: 34.7
MCHC: 34.8
MCHC: 34.8
MCV: 93.6
MCV: 93.8
MCV: 93.8
MCV: 94
MCV: 94
MCV: 94.1
MCV: 94.1
MCV: 94.2
MCV: 94.2
MCV: 95.1
Platelets: 173
Platelets: 185
Platelets: 212
Platelets: 215
Platelets: 218
Platelets: 238
Platelets: 292
Platelets: 295
Platelets: 342
RBC: 3.2 — ABNORMAL LOW
RBC: 3.22 — ABNORMAL LOW
RBC: 3.37 — ABNORMAL LOW
RBC: 3.39 — ABNORMAL LOW
RBC: 3.4 — ABNORMAL LOW
RBC: 3.43 — ABNORMAL LOW
RBC: 3.59 — ABNORMAL LOW
RBC: 3.62 — ABNORMAL LOW
RBC: 4.48
RBC: 4.52
RBC: 4.55
RBC: 4.68
RDW: 12.4
RDW: 12.6
RDW: 12.6
RDW: 12.6
RDW: 12.7
RDW: 12.7
RDW: 12.8
RDW: 12.9
RDW: 12.9
RDW: 12.9
WBC: 13.7 — ABNORMAL HIGH
WBC: 15.2 — ABNORMAL HIGH
WBC: 16.5 — ABNORMAL HIGH
WBC: 16.5 — ABNORMAL HIGH
WBC: 17 — ABNORMAL HIGH
WBC: 17.5 — ABNORMAL HIGH
WBC: 17.5 — ABNORMAL HIGH
WBC: 20.6 — ABNORMAL HIGH
WBC: 21.8 — ABNORMAL HIGH
WBC: 24.1 — ABNORMAL HIGH
WBC: 25.1 — ABNORMAL HIGH

## 2011-08-10 LAB — POCT I-STAT 4, (NA,K, GLUC, HGB,HCT)
Glucose, Bld: 116 — ABNORMAL HIGH
Glucose, Bld: 119 — ABNORMAL HIGH
Glucose, Bld: 121 — ABNORMAL HIGH
Glucose, Bld: 96
HCT: 28 — ABNORMAL LOW
HCT: 34 — ABNORMAL LOW
HCT: 41
Hemoglobin: 10.2 — ABNORMAL LOW
Hemoglobin: 11.6 — ABNORMAL LOW
Hemoglobin: 12.2 — ABNORMAL LOW
Hemoglobin: 13.9
Operator id: 257021
Operator id: 3406
Operator id: 3406
Operator id: 3406
Potassium: 4.3
Potassium: 4.8
Sodium: 127 — ABNORMAL LOW
Sodium: 133 — ABNORMAL LOW

## 2011-08-10 LAB — CK TOTAL AND CKMB (NOT AT ARMC)
CK, MB: 26.7 — ABNORMAL HIGH
CK, MB: 86.7 — ABNORMAL HIGH
Relative Index: 2.4
Relative Index: 7.1 — ABNORMAL HIGH
Total CK: 3685 — ABNORMAL HIGH
Total CK: 378 — ABNORMAL HIGH

## 2011-08-10 LAB — HEMOGLOBIN AND HEMATOCRIT, BLOOD
HCT: 30.2 — ABNORMAL LOW
Hemoglobin: 10.3 — ABNORMAL LOW

## 2011-08-10 LAB — POCT I-STAT 3, VENOUS BLOOD GAS (G3P V)
Acid-base deficit: 1
Bicarbonate: 23.7
Operator id: 298141
pH, Ven: 7.393 — ABNORMAL HIGH
pO2, Ven: 33

## 2011-08-10 LAB — HEPARIN LEVEL (UNFRACTIONATED)
Heparin Unfractionated: 0.29 — ABNORMAL LOW
Heparin Unfractionated: 0.38
Heparin Unfractionated: 0.14 — ABNORMAL LOW

## 2011-08-10 LAB — PROTIME-INR
INR: 1.3
Prothrombin Time: 16.5 — ABNORMAL HIGH

## 2011-08-10 LAB — APTT: aPTT: 29

## 2011-08-10 LAB — URINE CULTURE
Colony Count: NO GROWTH
Culture: NO GROWTH
Special Requests: NEGATIVE

## 2011-08-10 LAB — POCT I-STAT GLUCOSE: Glucose, Bld: 120 — ABNORMAL HIGH

## 2011-08-18 ENCOUNTER — Telehealth: Payer: Self-pay | Admitting: Gastroenterology

## 2011-08-18 NOTE — Telephone Encounter (Signed)
Opened in era  

## 2011-08-23 ENCOUNTER — Encounter: Payer: Self-pay | Admitting: Gastroenterology

## 2011-08-23 ENCOUNTER — Ambulatory Visit (INDEPENDENT_AMBULATORY_CARE_PROVIDER_SITE_OTHER): Payer: 59 | Admitting: Gastroenterology

## 2011-08-23 ENCOUNTER — Other Ambulatory Visit: Payer: Self-pay | Admitting: Gastroenterology

## 2011-08-23 VITALS — BP 119/79 | HR 93 | Temp 98.1°F | Ht 70.0 in | Wt 306.0 lb

## 2011-08-23 DIAGNOSIS — K219 Gastro-esophageal reflux disease without esophagitis: Secondary | ICD-10-CM

## 2011-08-23 DIAGNOSIS — K2270 Barrett's esophagus without dysplasia: Secondary | ICD-10-CM

## 2011-08-23 NOTE — Progress Notes (Signed)
Referring Provider: No ref. provider found Primary Care Physician:  Colette Ribas, MD Primary Gastroenterologist: Dr. Darrick Penna   Chief Complaint  Patient presents with  . Follow-up    year    HPI:   [name] returns today in f/u, hx of Barrett's esophagus diagnosed on EGD in Dec 2011, due for surveillance this year. He denies any abdominal pain; he does report nausea if eating too much. Reports lack of appetite, trying to eat smaller meals. States getting choked a lot, regardless of food. Denies eating fast. He is unsure which PPI he is taking. I believe he should be on Prilosec, as Protonix seemed to be too expensive in the past.  Reports "knot" in epigastric area, above ventral hernia repair scar. Denies tenderness or pain in this area.  His last EGD was done with Propofol as he had large doses of Versed/Demerol during TCS in 2009.   Wt in Nov 2011: 321. Today 306.   Past Medical History  Diagnosis Date  . Myocardial infarction   . CAD (coronary artery disease)   . Morbid obesity   . PAF (paroxysmal atrial fibrillation)   . HTN (hypertension)   . COPD (chronic obstructive pulmonary disease)   . Hyperlipidemia   . Sleep apnea   . S/P colonoscopy 2009    3-4 mm transverese colon erosions likely secondary to  ASA  . S/P endoscopy Dec 2011    moderate erosive gastritis, Barrett's esophagus 1-2cm    Past Surgical History  Procedure Date  . Cabg x 4 03/2008  . Hernia repair   . Ureteral stent placement     Current Outpatient Prescriptions  Medication Sig Dispense Refill  . aspirin 81 MG tablet Take 81 mg by mouth daily.        Marland Kitchen EFFIENT 10 MG TABS       . furosemide (LASIX) 40 MG tablet Take 40 mg by mouth 2 (two) times daily.        . isosorbide mononitrate (IMDUR) 60 MG 24 hr tablet Take 60 mg by mouth daily.        Marland Kitchen lisinopril (PRINIVIL,ZESTRIL) 5 MG tablet       . metoprolol tartrate (LOPRESSOR) 25 MG tablet Take 25 mg by mouth 2 (two) times daily.        .  niacin (NIASPAN) 1000 MG CR tablet Take 1,000 mg by mouth at bedtime.        . potassium chloride SA (K-DUR,KLOR-CON) 20 MEQ tablet Take 20 mEq by mouth 2 (two) times daily.        . ranolazine (RANEXA) 500 MG 12 hr tablet Take 500 mg by mouth 2 (two) times daily.        . rosuvastatin (CRESTOR) 20 MG tablet Take 20 mg by mouth daily.        . TRILIPIX 135 MG capsule         Allergies as of 08/23/2011 - Review Complete 08/23/2011  Allergen Reaction Noted  . Iodine Other (See Comments)   . Iohexol  01/02/2008    Family History  Problem Relation Age of Onset  . Colon cancer Neg Hx     History   Social History  . Marital Status: Married    Spouse Name: N/A    Number of Children: N/A  . Years of Education: N/A   Social History Main Topics  . Smoking status: Current Everyday Smoker -- 0.5 packs/day    Types: Cigarettes  . Smokeless tobacco: None  .  Alcohol Use: Yes     a few beers thru the week  . Drug Use: No  . Sexually Active: None   Other Topics Concern  . None   Social History Narrative  . None    Review of Systems: Gen: Denies fever, chills, anorexia. Denies fatigue, weakness, weight loss.  CV: Denies chest pain, palpitations, syncope, peripheral edema, and claudication. Resp: Denies dyspnea at rest, cough, wheezing, coughing up blood, and pleurisy. GI: Denies vomiting blood, jaundice, and fecal incontinence. + dysphagia. Derm: Denies rash, itching, dry skin Psych: Denies depression, anxiety, memory loss, confusion. No homicidal or suicidal ideation.  Heme: Denies bruising, bleeding, and enlarged lymph nodes.  Physical Exam: BP 119/79  Pulse 93  Temp(Src) 98.1 F (36.7 C) (Temporal)  Ht 5\' 10"  (1.778 m)  Wt 306 lb (138.801 kg)  BMI 43.91 kg/m2 General:   Alert and oriented. No distress noted. Pleasant and cooperative. Obese.  Head:  Normocephalic and atraumatic. Eyes:  Conjuctiva clear without scleral icterus. Mouth:  Oral mucosa pink and moist. Good  dentition. No lesions. Heart:  S1, S2 present without murmurs, rubs, or gallops.  Abdomen:  +BS, soft, obese, non-tender and non-distended. No rebound or guarding. No HSM or masses noted. Ventral hernia scar noted, no obvious hernias appreciated. On sitting up, slightly more pronounced bulge RUQ, right above prior ventral hernia repair, feels consistent with scar tissue. Msk:  Symmetrical without gross deformities. Normal posture. Extremities:  Without edema. Neurologic:  Alert and  oriented x4;  grossly normal neurologically. Skin:  Intact without significant lesions or rashes. Psych:  Alert and cooperative. Normal mood and affect.

## 2011-08-23 NOTE — Patient Instructions (Signed)
Please call [name] when you get home to let [name] know when medicine you are taking for reflux. In the meantime, use the samples provided (Prilosec one in morning and one in evening before meals).   We have set you up for an endoscopy with Dr. Darrick Penna in the near future. Further recommendations to follow once this is completed.

## 2011-08-25 DIAGNOSIS — K2270 Barrett's esophagus without dysplasia: Secondary | ICD-10-CM | POA: Insufficient documentation

## 2011-08-25 NOTE — Assessment & Plan Note (Signed)
50 year old male with hx of Barrett's esophagus, diagnosed on EGD Dec 2011; due for surveillance now. Reports new onset dysphagia regardless of type of food/drink. He also reports loss of appetite, and has lost from 321 to 306 today. He is unsure which PPI he is taking at home. I have asked him to call us back to let us know for our med list. We will proceed with an upper endoscopy in the near future with Propofol due to hx of difficult sedation in past.  Proceed with upper endoscopy, possible dilation in the near future with Dr. Darrick Penna. This will be done with the assistance of Propofol due to hx of difficult sedation. The risks, benefits, and alternatives have been discussed in detail with patient. They have stated understanding and desire to proceed.

## 2011-08-26 NOTE — Progress Notes (Signed)
Cc to PCP 

## 2011-09-01 ENCOUNTER — Encounter (HOSPITAL_COMMUNITY)
Admission: RE | Admit: 2011-09-01 | Discharge: 2011-09-01 | Disposition: A | Discharge status: Home / Self Care | Payer: BC Managed Care – PPO | Source: Ambulatory Visit | Attending: Gastroenterology | Admitting: Gastroenterology

## 2011-09-01 ENCOUNTER — Encounter (HOSPITAL_COMMUNITY): Payer: Self-pay

## 2011-09-01 HISTORY — DX: Depression, unspecified: F32.A

## 2011-09-01 HISTORY — DX: Barrett's esophagus without dysplasia: K22.70

## 2011-09-01 HISTORY — DX: Chronic kidney disease, unspecified: N18.9

## 2011-09-01 HISTORY — DX: Gastro-esophageal reflux disease without esophagitis: K21.9

## 2011-09-01 HISTORY — DX: Major depressive disorder, single episode, unspecified: F32.9

## 2011-09-01 LAB — HEMOGLOBIN AND HEMATOCRIT, BLOOD
HCT: 46.2 % (ref 39.0–52.0)
Hemoglobin: 16.2 g/dL (ref 13.0–17.0)

## 2011-09-01 LAB — BASIC METABOLIC PANEL
BUN: 9 mg/dL (ref 6–23)
Chloride: 103 mEq/L (ref 96–112)
Creatinine, Ser: 1.03 mg/dL (ref 0.50–1.35)
GFR calc Af Amer: >90 mL/min (ref >90)
GFR calc non Af Amer: 84 mL/min — ABNORMAL LOW (ref >90)
Potassium: 3.7 mEq/L (ref 3.5–5.1)

## 2011-09-01 NOTE — Patient Instructions (Addendum)
20 ANTAR MILKS  09/01/2011   Your procedure is scheduled on:  09/06/2011  Report to [facility] at 0810 AM.  Call this number if you have problems the morning of surgery: [phone]   Remember:   Do not eat food:After Midnight.  Do not drink clear liquids: After Midnight.  Take these medicines the morning of surgery with A SIP OF WATER: lopressor, Imdur, Lisinopril   Do not wear jewelry, make-up or nail polish.  Do not wear lotions, powders, or perfumes. You may wear deodorant.  Do not shave 48 hours prior to surgery.  Do not bring valuables to the hospital.  Contacts, dentures or bridgework may not be worn into surgery.  Leave suitcase in the car. After surgery it may be brought to your room.  For patients admitted to the hospital, checkout time is 11:00 AM the day of discharge.   Patients discharged the day of surgery will not be allowed to drive home.  Name and phone number of your driver:   Special Instructions: N/A   Please read over the following fact sheets that you were given: Pain Booklet    egdEsophagogastroduodenoscopy This is an endoscopic procedure (a procedure that uses a device like a flexible telescope) that allows your caregiver to view the upper stomach and small bowel. This test allows your caregiver to look at the esophagus. The esophagus carries food from your mouth to your stomach. They can also look at your duodenum. This is the first part of the small intestine that attaches to the stomach. This test is used to detect problems in the bowel such as ulcers and inflammation. PREPARATION FOR TEST Nothing to eat after midnight the day before the test. NORMAL FINDINGS Normal esophagus, stomach, and duodenum. Ranges for normal findings may vary among different laboratories and hospitals. You should always check with your doctor after having lab work or other tests done to discuss the meaning of your test results and whether your values are considered within  normal limits. MEANING OF TEST  Your caregiver will go over the test results with you and discuss the importance and meaning of your results, as well as treatment options and the need for additional tests if necessary. OBTAINING THE TEST RESULTS It is your responsibility to obtain your test results. Ask the lab or department performing the test when and how you will get your results. Document Released: 02/18/2005 Document Revised: 06/30/2011 Document Reviewed: 09/27/2008 [facility] Patient Information 2012 [place], [place].PATIENT INSTRUCTIONS POST-ANESTHESIA  IMMEDIATELY FOLLOWING SURGERY:  Do not drive or operate machinery for the first twenty four hours after surgery.  Do not make any important decisions for twenty four hours after surgery or while taking narcotic pain medications or sedatives.  If you develop intractable nausea and vomiting or a severe headache please notify your doctor immediately.  FOLLOW-UP:  Please make an appointment with your surgeon as instructed. You do not need to follow up with anesthesia unless specifically instructed to do so.  WOUND CARE INSTRUCTIONS (if applicable):  Keep a dry clean dressing on the anesthesia/puncture wound site if there is drainage.  Once the wound has quit draining you may leave it open to air.  Generally you should leave the bandage intact for twenty four hours unless there is drainage.  If the epidural site drains for more than 36-48 hours please call the anesthesia department.  QUESTIONS?:  Please feel free to call your physician or the hospital operator if you have any questions, and they will  be happy to assist you.     [place] [phone]

## 2011-09-02 HISTORY — PX: ESOPHAGOGASTRODUODENOSCOPY: SHX1529

## 2011-09-04 NOTE — H&P (Signed)
Reason for Visit     Follow-up    year        Vitals - Last Recorded       BP Pulse Temp(Src) Ht Wt BMI    119/79  93  98.1 F (36.7 C) (Temporal)  5\' 10"  (1.778 m)  306 lb (138.801 kg)  43.91 kg/m2       Progress Notes     Ronnie Halls, NP  08/25/2011  4:43 PM  Signed   Referring Provider: No ref. provider found Primary Care Physician:  Colette Ribas, MD Primary Gastroenterologist: Dr. Darrick Penna     Chief Complaint   Patient presents with   .  Follow-up       year      HPI:    [name] returns today in f/u, hx of Barrett's esophagus diagnosed on EGD in Dec 2011, due for surveillance this year. He denies any abdominal pain; he does report nausea if eating too much. Reports lack of appetite, trying to eat smaller meals. States getting choked a lot, regardless of food. Denies eating fast. He is unsure which PPI he is taking. I believe he should be on Prilosec, as Protonix seemed to be too expensive in the past.   Reports "knot" in epigastric area, above ventral hernia repair scar. Denies tenderness or pain in this area.   His last EGD was done with Propofol as he had large doses of Versed/Demerol during TCS in 2009.    Wt in Nov 2011: 321. Today 306.     Past Medical History   Diagnosis  Date   .  Myocardial infarction     .  CAD (coronary artery disease)     .  Morbid obesity     .  PAF (paroxysmal atrial fibrillation)     .  HTN (hypertension)     .  COPD (chronic obstructive pulmonary disease)     .  Hyperlipidemia     .  Sleep apnea     .  S/P colonoscopy  2009       3-4 mm transverese colon erosions likely secondary to  ASA   .  S/P endoscopy  Dec 2011       moderate erosive gastritis, Barrett's esophagus 1-2cm       Past Surgical History   Procedure  Date   .  Cabg x 4  03/2008   .  Hernia repair     .  Ureteral stent placement         Current Outpatient Prescriptions   Medication  Sig  Dispense  Refill   .  aspirin 81 MG tablet  Take 81 mg by  mouth daily.           Marland Kitchen  EFFIENT 10 MG TABS           .  furosemide (LASIX) 40 MG tablet  Take 40 mg by mouth 2 (two) times daily.           .  isosorbide mononitrate (IMDUR) 60 MG 24 hr tablet  Take 60 mg by mouth daily.           Marland Kitchen  lisinopril (PRINIVIL,ZESTRIL) 5 MG tablet           .  metoprolol tartrate (LOPRESSOR) 25 MG tablet  Take 25 mg by mouth 2 (two) times daily.           .  niacin (NIASPAN) 1000 MG CR tablet  Take  1,000 mg by mouth at bedtime.           .  potassium chloride SA (K-DUR,KLOR-CON) 20 MEQ tablet  Take 20 mEq by mouth 2 (two) times daily.           .  ranolazine (RANEXA) 500 MG 12 hr tablet  Take 500 mg by mouth 2 (two) times daily.           .  rosuvastatin (CRESTOR) 20 MG tablet  Take 20 mg by mouth daily.           .  TRILIPIX 135 MG capsule               Allergies as of 08/23/2011 - Review Complete 08/23/2011   Allergen  Reaction  Noted   .  Iodine  Other (See Comments)     .  Iohexol    01/02/2008       Family History   Problem  Relation  Age of Onset   .  Colon cancer  Neg Hx         History       Social History   .  Marital Status:  Married       Spouse Name:  N/A       Number of Children:  N/A   .  Years of Education:  N/A       Social History Main Topics   .  Smoking status:  Current Everyday Smoker -- 0.5 packs/day       Types:  Cigarettes   .  Smokeless tobacco:  None   .  Alcohol Use:  Yes         a few beers thru the week   .  Drug Use:  No   .  Sexually Active:  None       Other Topics  Concern   .  None       Social History Narrative   .  None      Review of Systems: Gen: Denies fever, chills, anorexia. Denies fatigue, weakness, weight loss.   CV: Denies chest pain, palpitations, syncope, peripheral edema, and claudication. Resp: Denies dyspnea at rest, cough, wheezing, coughing up blood, and pleurisy. GI: Denies vomiting blood, jaundice, and fecal incontinence. + dysphagia. Derm: Denies rash, itching, dry skin Psych:  Denies depression, anxiety, memory loss, confusion. No homicidal or suicidal ideation.   Heme: Denies bruising, bleeding, and enlarged lymph nodes.   Physical Exam: BP 119/79  Pulse 93  Temp(Src) 98.1 F (36.7 C) (Temporal)  Ht 5\' 10"  (1.778 m)  Wt 306 lb (138.801 kg)  BMI 43.91 kg/m2 General:   Alert and oriented. No distress noted. Pleasant and cooperative. Obese.   Head:  Normocephalic and atraumatic. Eyes:  Conjuctiva clear without scleral icterus. Mouth:  Oral mucosa pink and moist. Good dentition. No lesions. Heart:  S1, S2 present without murmurs, rubs, or gallops.   Abdomen:  +BS, soft, obese, non-tender and non-distended. No rebound or guarding. No HSM or masses noted. Ventral hernia scar noted, no obvious hernias appreciated. On sitting up, slightly more pronounced bulge RUQ, right above prior ventral hernia repair, feels consistent with scar tissue. Msk:  Symmetrical without gross deformities. Normal posture. Extremities:  Without edema. Neurologic:  Alert and  oriented x4;  grossly normal neurologically. Skin:  Intact without significant lesions or rashes. Psych:  Alert and cooperative. Normal mood and affect.     [name]  08/26/2011 11:28 AM  Signed Cc to PCP     Barrett's esophagus - Ronnie Halls, NP  08/25/2011  4:42 PM  Signed 50 year old male with hx of Barrett's esophagus, diagnosed on EGD Dec 2011; due for surveillance now. Reports new onset dysphagia regardless of type of food/drink. He also reports loss of appetite, and has lost from 321 to 306 today. He is unsure which PPI he is taking at home. I have asked him to call us back to let us know for our med list. We will proceed with an upper endoscopy in the near future with Propofol due to hx of difficult sedation in past.   Proceed with upper endoscopy, possible dilation in the near future with Dr. Darrick Penna. This will be done with the assistance of Propofol due to hx of difficult sedation. The risks, benefits,  and alternatives have been discussed in detail with patient. They have stated understanding and desire to proceed.

## 2011-09-06 ENCOUNTER — Ambulatory Visit (HOSPITAL_COMMUNITY)
Admission: RE | Admit: 2011-09-06 | Discharge: 2011-09-06 | Disposition: A | Discharge status: Home / Self Care | Payer: BC Managed Care – PPO | Source: Ambulatory Visit | Attending: Gastroenterology | Admitting: Gastroenterology

## 2011-09-06 ENCOUNTER — Encounter (HOSPITAL_COMMUNITY): Payer: Self-pay | Admitting: Anesthesiology

## 2011-09-06 ENCOUNTER — Encounter (HOSPITAL_COMMUNITY): Payer: Self-pay

## 2011-09-06 ENCOUNTER — Encounter (HOSPITAL_COMMUNITY)
Admission: RE | Disposition: A | Discharge status: Home / Self Care | Payer: Self-pay | Source: Ambulatory Visit | Attending: Gastroenterology

## 2011-09-06 ENCOUNTER — Ambulatory Visit (HOSPITAL_COMMUNITY): Payer: BC Managed Care – PPO | Admitting: Anesthesiology

## 2011-09-06 ENCOUNTER — Other Ambulatory Visit: Payer: Self-pay | Admitting: Gastroenterology

## 2011-09-06 DIAGNOSIS — K209 Esophagitis, unspecified without bleeding: Secondary | ICD-10-CM

## 2011-09-06 DIAGNOSIS — K2990 Gastroduodenitis, unspecified, without bleeding: Secondary | ICD-10-CM

## 2011-09-06 DIAGNOSIS — K2970 Gastritis, unspecified, without bleeding: Secondary | ICD-10-CM

## 2011-09-06 DIAGNOSIS — R1310 Dysphagia, unspecified: Secondary | ICD-10-CM | POA: Insufficient documentation

## 2011-09-06 DIAGNOSIS — K2270 Barrett's esophagus without dysplasia: Secondary | ICD-10-CM | POA: Insufficient documentation

## 2011-09-06 HISTORY — PX: SAVORY DILATION: SHX5439

## 2011-09-06 SURGERY — ESOPHAGOGASTRODUODENOSCOPY (EGD) WITH PROPOFOL
Anesthesia: Monitor Anesthesia Care | Site: Throat

## 2011-09-06 MED ORDER — FENTANYL CITRATE 0.05 MG/ML IJ SOLN
INTRAMUSCULAR | Status: DC | PRN
  Administered 2011-09-06 (×2): 50 ug via INTRAVENOUS

## 2011-09-06 MED ORDER — LIDOCAINE HCL 1 % IJ SOLN
INTRAMUSCULAR | Status: DC | PRN
  Administered 2011-09-06: 10 mg via INTRADERMAL

## 2011-09-06 MED ORDER — GLYCOPYRROLATE 0.2 MG/ML IJ SOLN
0.2000 mg | Freq: Once | INTRAMUSCULAR | Status: AC
  Administered 2011-09-06: 0.2 mg via INTRAVENOUS

## 2011-09-06 MED ORDER — MIDAZOLAM HCL 2 MG/2ML IJ SOLN
INTRAMUSCULAR | Status: AC
  Administered 2011-09-06: 2 mg via INTRAVENOUS
  Filled 2011-09-06: qty 2

## 2011-09-06 MED ORDER — LACTATED RINGERS IV SOLN
INTRAVENOUS | Status: DC
  Administered 2011-09-06: 09:00:00 via INTRAVENOUS

## 2011-09-06 MED ORDER — MINERAL OIL PO OIL
TOPICAL_OIL | ORAL | Status: AC
  Filled 2011-09-06: qty 60

## 2011-09-06 MED ORDER — MIDAZOLAM HCL 2 MG/2ML IJ SOLN
1.0000 mg | INTRAMUSCULAR | Status: DC | PRN
  Administered 2011-09-06: 2 mg via INTRAVENOUS

## 2011-09-06 MED ORDER — STERILE WATER FOR IRRIGATION IR SOLN
Status: DC | PRN
  Administered 2011-09-06: 10:00:00

## 2011-09-06 MED ORDER — PROPOFOL 10 MG/ML IV EMUL
INTRAVENOUS | Status: AC
  Filled 2011-09-06: qty 20

## 2011-09-06 MED ORDER — METOPROLOL TARTRATE 1 MG/ML IV SOLN
INTRAVENOUS | Status: AC
  Administered 2011-09-06: 5 mg via INTRAVENOUS
  Filled 2011-09-06: qty 5

## 2011-09-06 MED ORDER — BUTAMBEN-TETRACAINE-BENZOCAINE 2-2-14 % EX AERO
1.0000 | INHALATION_SPRAY | Freq: Once | CUTANEOUS | Status: AC
  Administered 2011-09-06: 1 via TOPICAL
  Filled 2011-09-06: qty 56

## 2011-09-06 MED ORDER — FENTANYL CITRATE 0.05 MG/ML IJ SOLN
INTRAMUSCULAR | Status: AC
  Filled 2011-09-06: qty 2

## 2011-09-06 MED ORDER — METOPROLOL TARTRATE 1 MG/ML IV SOLN
5.0000 mg | Freq: Once | INTRAVENOUS | Status: AC
  Administered 2011-09-06: 5 mg via INTRAVENOUS

## 2011-09-06 MED ORDER — PROPOFOL 10 MG/ML IV EMUL
INTRAVENOUS | Status: DC | PRN
  Administered 2011-09-06: 10:00:00 via INTRAVENOUS
  Administered 2011-09-06: 100 ug/kg/min via INTRAVENOUS

## 2011-09-06 MED ORDER — LIDOCAINE HCL (PF) 1 % IJ SOLN
INTRAMUSCULAR | Status: AC
  Filled 2011-09-06: qty 5

## 2011-09-06 MED ORDER — PANTOPRAZOLE SODIUM 40 MG PO TBEC: DELAYED_RELEASE_TABLET | ORAL | Status: DC

## 2011-09-06 MED ORDER — ONDANSETRON HCL 4 MG/2ML IJ SOLN
INTRAMUSCULAR | Status: AC
  Filled 2011-09-06: qty 2

## 2011-09-06 MED ORDER — GLYCOPYRROLATE 0.2 MG/ML IJ SOLN
INTRAMUSCULAR | Status: AC
  Administered 2011-09-06: 0.2 mg via INTRAVENOUS
  Filled 2011-09-06: qty 1

## 2011-09-06 MED ORDER — EPHEDRINE SULFATE 50 MG/ML IJ SOLN: INTRAMUSCULAR | Status: DC | PRN

## 2011-09-06 MED ORDER — ONDANSETRON HCL 4 MG/2ML IJ SOLN
4.0000 mg | Freq: Once | INTRAMUSCULAR | Status: AC
  Administered 2011-09-06: 4 mg via INTRAVENOUS

## 2011-09-06 SURGICAL SUPPLY — 17 items
BLOCK BITE 60FR ADLT L/F BLUE (MISCELLANEOUS) ×2 IMPLANT
ELECT REM PT RETURN 9FT ADLT (ELECTROSURGICAL)
ELECTRODE REM PT RTRN 9FT ADLT (ELECTROSURGICAL) IMPLANT
FLOOR PAD 36X40 (MISCELLANEOUS) ×2
FORCEP RJ3 GP 1.8X160 W-NEEDLE (CUTTING FORCEPS) IMPLANT
FORCEPS BIOP RAD 4 LRG CAP 4 (CUTTING FORCEPS) ×1 IMPLANT
NDL SCLEROTHERAPY 25GX240 (NEEDLE) IMPLANT
NEEDLE SCLEROTHERAPY 25GX240 (NEEDLE) IMPLANT
PAD FLOOR 36X40 (MISCELLANEOUS) ×1 IMPLANT
PROBE APC STR FIRE (PROBE) IMPLANT
PROBE INJECTION GOLD (MISCELLANEOUS)
PROBE INJECTION GOLD 7FR (MISCELLANEOUS) IMPLANT
SNARE SHORT THROW 13M SML OVAL (MISCELLANEOUS) IMPLANT
SYR 50ML LL SCALE MARK (SYRINGE) ×1 IMPLANT
TUBING ENDO SMARTCAP PENTAX (MISCELLANEOUS) ×4 IMPLANT
TUBING IRRIGATION ENDOGATOR (MISCELLANEOUS) ×2 IMPLANT
WATER STERILE IRR 1000ML POUR (IV SOLUTION) ×1 IMPLANT

## 2011-09-06 NOTE — Anesthesia Postprocedure Evaluation (Signed)
Anesthesia Post Note  Patient: [name]  Procedure(s) Performed:  SAVORY DILATION - Dilated with 15mm; ESOPHAGOGASTRODUODENOSCOPY (EGD) WITH PROPOFOL  Anesthesia type: MAC  Patient location: PACU  Post pain: Pain level controlled  Post assessment: Post-op Vital signs reviewed, Patient's Cardiovascular Status Stable, Respiratory Function Stable, Patent Airway, No signs of Nausea or vomiting and Pain level controlled  Last Vitals:  Filed Vitals:   09/06/11 1040  BP: 77/50  Pulse: 103  Temp: 36.8 C  Resp: 13    Post vital signs: Reviewed and stable  Level of consciousness: awake and alert   Complications: No apparent anesthesia complications

## 2011-09-06 NOTE — Transfer of Care (Signed)
Immediate Anesthesia Transfer of Care Note  Patient: [name]  Procedure(s) Performed:  SAVORY DILATION - Dilated with 15mm; ESOPHAGOGASTRODUODENOSCOPY (EGD) WITH PROPOFOL  Patient Location: PACU  Anesthesia Type: MAC  Level of Consciousness: awake  Airway & Oxygen Therapy: Patient Spontanous Breathing. Nasal cannula  Post-op Assessment: Report given to PACU RN, Post -op Vital signs reviewed and stable and Patient moving all extremities  Post vital signs: Reviewed and stable  Complications: No apparent anesthesia complications

## 2011-09-06 NOTE — Interval H&P Note (Signed)
History and Physical Interval Note:   09/06/2011   9:51 AM   [name]  has presented today for surgery, with the diagnosis of barretts surveillance  The various methods of treatment have been discussed with the patient and family. After consideration of risks, benefits and other options for treatment, the patient has consented to  Procedure(s): ESOPHAGOGASTRODUODENOSCOPY (EGD) WITH PROPOFOL as a surgical intervention .  The patients' history has been reviewed, patient examined, no change in status, stable for surgery.  I have reviewed the patients' chart and labs.  Questions were answered to the patient's satisfaction.     Jonette Eva  MD       THE PATIENT WAS EXAMINED AND THERE IS NO CHANGE IN THE PATIENT'S CONDITION SINCE THE ORIGINAL H&P WAS COMPLETED.

## 2011-09-06 NOTE — Anesthesia Procedure Notes (Signed)
Procedure Name: MAC Date/Time: 09/06/2011 10:10 AM Performed by: Minerva Areola Pre-anesthesia Checklist: Patient identified, Patient being monitored, Emergency Drugs available, Timeout performed and Suction available Patient Re-evaluated:Patient Re-evaluated prior to inductionOxygen Delivery Method: Simple face mask

## 2011-09-06 NOTE — Anesthesia Preprocedure Evaluation (Addendum)
Anesthesia Evaluation  Patient identified by MRN, date of birth, ID band Patient awake    Reviewed: Allergy & Precautions, H&P , NPO status , Patient's Chart, lab work & pertinent test results, reviewed documented beta blocker date and time   History of Anesthesia Complications Negative for: history of anesthetic complications  Airway Mallampati: II      Dental  (+) Edentulous Upper and Partial Lower   Pulmonary sleep apnea , COPD COPD inhaler,  + rhonchi        Cardiovascular hypertension, Pt. on medications + angina with exertion + CAD, + Past MI, + CABG, + Orthopnea and + DOE Regular Normal    Neuro/Psych PSYCHIATRIC DISORDERS Depression    GI/Hepatic GERD- (barrett's esophagus)  Medicated,  Endo/Other    Renal/GU      Musculoskeletal   Abdominal   Peds  Hematology   Anesthesia Other Findings   Reproductive/Obstetrics                           Anesthesia Physical Anesthesia Plan  ASA: III  Anesthesia Plan: MAC   Post-op Pain Management:    Induction:   Airway Management Planned: Simple Face Mask  Additional Equipment:   Intra-op Plan:   Post-operative Plan:   Informed Consent: I have reviewed the patients History and Physical, chart, labs and discussed the procedure including the risks, benefits and alternatives for the proposed anesthesia with the patient or authorized representative who has indicated his/her understanding and acceptance.     Plan Discussed with:   Anesthesia Plan Comments: (preop lopressor ordered.)        Anesthesia Quick Evaluation

## 2011-09-10 ENCOUNTER — Encounter (HOSPITAL_COMMUNITY): Payer: Self-pay | Admitting: Gastroenterology

## 2011-09-10 ENCOUNTER — Telehealth: Payer: Self-pay | Admitting: Gastroenterology

## 2011-09-10 NOTE — Telephone Encounter (Signed)
Please call pt. The biopsies show Barrett's. Repeat EGD in 3 years. His stomach Bx shows mild gastritis. Continue PROTONIX 30 minutes prior to meals. LOW FAT DIET/ CONTINUE WEIGHT LOSS EFFORTS.

## 2011-09-13 NOTE — Telephone Encounter (Signed)
Pt informed

## 2011-09-13 NOTE — Telephone Encounter (Signed)
Results Cc to PCP  

## 2011-09-13 NOTE — Telephone Encounter (Signed)
Reminder in epic to repeat EGD in 3 years °

## 2011-09-13 NOTE — Telephone Encounter (Signed)
LMOM to call.

## 2011-10-01 NOTE — Progress Notes (Signed)
EGD/DIL NOV 2012 ESOPHAGITIS, GASTRITIS, DUODENITIS

## 2011-11-16 DIAGNOSIS — I719 Aortic aneurysm of unspecified site, without rupture: Secondary | ICD-10-CM | POA: Diagnosis not present

## 2011-11-16 DIAGNOSIS — I70219 Atherosclerosis of native arteries of extremities with intermittent claudication, unspecified extremity: Secondary | ICD-10-CM | POA: Diagnosis not present

## 2011-11-16 DIAGNOSIS — I739 Peripheral vascular disease, unspecified: Secondary | ICD-10-CM

## 2011-11-16 HISTORY — DX: Peripheral vascular disease, unspecified: I73.9

## 2012-01-04 ENCOUNTER — Ambulatory Visit: Payer: BC Managed Care – PPO | Admitting: Gastroenterology

## 2012-01-04 ENCOUNTER — Telehealth: Payer: Self-pay | Admitting: Gastroenterology

## 2012-01-04 NOTE — Telephone Encounter (Signed)
Patient was a no-show 

## 2012-01-04 NOTE — Telephone Encounter (Signed)
Patient needs to reschedule OV as he is due for repeat EGD.

## 2012-01-05 NOTE — Telephone Encounter (Signed)
LMOM

## 2012-01-12 ENCOUNTER — Ambulatory Visit (INDEPENDENT_AMBULATORY_CARE_PROVIDER_SITE_OTHER): Payer: BC Managed Care – PPO | Admitting: Gastroenterology

## 2012-01-12 ENCOUNTER — Encounter: Payer: Self-pay | Admitting: Gastroenterology

## 2012-01-12 VITALS — BP 111/73 | HR 75 | Temp 98.5°F | Ht 70.0 in | Wt 306.2 lb

## 2012-01-12 DIAGNOSIS — K219 Gastro-esophageal reflux disease without esophagitis: Secondary | ICD-10-CM

## 2012-01-12 DIAGNOSIS — K2270 Barrett's esophagus without dysplasia: Secondary | ICD-10-CM | POA: Diagnosis not present

## 2012-01-12 NOTE — Patient Instructions (Signed)
Take your pantoprazole daily.  Office visit in one year.  Gastroesophageal Reflux Disease, Adult Gastroesophageal reflux disease (GERD) happens when acid from your stomach goes into your food pipe (esophagus). The acid can cause a burning feeling in your chest. Over time, the acid can make small holes (ulcers) in your food pipe.  HOME CARE  Ask your doctor for advice about:   Losing weight.   Quitting smoking.   Alcohol use.   Avoid foods and drinks that make your problems worse. You may want to avoid:   Caffeine and alcohol.   Chocolate.   Mints.   Garlic and onions.   Spicy foods.   Citrus fruits, such as oranges, lemons, or limes.   Foods that contain tomato, such as sauce, chili, salsa, and pizza.   Fried and fatty foods.   Avoid lying down for 3 hours before you go to bed or before you take a nap.   Eat small meals often, instead of large meals.   Wear loose-fitting clothing. Do not wear anything tight around your waist.   Raise (elevate) the head of your bed 6 to 8 inches with wood blocks. Using extra pillows does not help.   Only take medicines as told by your doctor.   Do not take aspirin or ibuprofen.  GET HELP RIGHT AWAY IF:   You have pain in your arms, neck, jaw, teeth, or back.   Your pain gets worse or changes.   You feel sick to your stomach (nauseous), throw up (vomit), or sweat (diaphoresis).   You feel short of breath, or you pass out (faint).   Your throw up is green, yellow, black, or looks like coffee grounds or blood.   Your poop (stool) is red, bloody, or black.  MAKE SURE YOU:   Understand these instructions.   Will watch your condition.   Will get help right away if you are not doing well or get worse.  Document Released: 04/05/2008 Document Revised: 10/07/2011 Document Reviewed: 05/07/2011 [facility] Patient Information 2012 [place], [place].   Barrett's Esophagus The esophagus is the muscular tube that carries food and  saliva from the mouth to the stomach. Barrett's esophagus involves changes in the esophagus. Some of its lining is replaced by a type of tissue similar to that found in the intestine. This process is called intestinal metaplasia. While Barrett's esophagus may cause no symptoms itself, a small number of people with this condition develop a relatively rare but often deadly type of cancer of the esophagus. It is called esophageal adenocarcinoma. Barrett's esophagus is associated with the common condition called GERD (gastroesophageal reflux disease). HOW THE ESOPHAGUS WORKS The esophagus carries food, liquids, and saliva from the mouth to the stomach. The stomach acts as a container to start digestion and pump food and liquids into the intestines in a controlled process. Food can then be properly digested over time. Nutrients can be taken in (absorbed) by the intestines. The esophagus moves food to the stomach by coordinated contractions of its muscular lining. This process is automatic. People are usually not aware of it. Many people have felt their esophagus when they:  Swallow something too large.   Try to eat too quickly.   Drink very hot or cold liquids.  They then feel the movement of the food or drink down the esophagus into the stomach. This may be an uncomfortable feeling. DIGESTIVE TRACT The muscular layers of the esophagus are normally pinched together at both the upper and lower ends  by muscles. These muscles are called sphincters. When a person swallows, the sphincters relax automatically. This allows food or drink to pass from the mouth, into the stomach. The muscles then close rapidly. This prevents the swallowed food or drink from leaking out of the stomach, back into the esophagus or into the mouth. These muscles make it possible to swallow while lying down or even upside-down. When people belch to release swallowed air or gas from carbonated beverages, the sphincters relax. Then small  amounts of food or drink may come back up, briefly. This condition is called reflux. The esophagus quickly squeezes the material back into the stomach. This is considered normal. These functions of the esophagus are an important part of everyday life. However, people who must have their esophagus removed, for example because of cancer, can live a relatively healthy life without it. GERD (GASTROESOPHAGEAL REFLUX DISEASE) Having some stomach contents (liquids or gas) sometimes reflux (come back up from the stomach into the esophagus) is considered normal. When it happens often, and causes other symptoms, it is considered a medical problem or disease. However, it is not necessarily a serious one or one that requires seeing a caregiver. The stomach produces acid and enzymes to digest food. When this mixture refluxes into the esophagus more often than normal or for a longer period of time than normal, it may produce symptoms. These symptoms are often called acid reflux. They are often described by people as heartburn, indigestion, or "gas". The symptoms often consist of a burning sensation below and behind the lower part of the breastbone or sternum. Almost everyone has experienced these symptoms at least once. This is typically a result of overeating. Other things that provoke GERD symptoms include:  Being overweight.   Eating certain types of foods.   Being pregnant.  In most people, GERD symptoms may last only a short time and require no treatment at all.  More continual symptoms are often quickly relieved by over-the-counter acid-reducing agents, such as antacids. Other drugs used to relieve GERD symptoms are antisecretory drugs, such as histamine2 (H2) blockers or proton pump inhibitors. People who have symptoms often should talk with their caregiver. Other diseases can have similar symptoms. Prescription medicines, together with other actions, might be needed to reduce reflux. GERD that is untreated  over a long period can lead to problems. An example is an ulcer in the esophagus, that could cause bleeding. Another common problem is scar tissue that blocks the movement of swallowed food and drink through the esophagus. This condition is called stricture. Esophageal reflux may also cause certain less common symptoms. These include hoarseness or chronic cough. It sometimes provokes conditions such as asthma. While most patients find that lifestyle changes and acid-blocking drugs relieve their symptoms, caregivers sometimes advise surgery. Overall, GERD is one of the most common medical conditions. About 20 percent of the population can be affected over a lifetime. GERD AND BARRETT'S ESOPHAGUS The exact causes of Barrett's esophagus are not known. It is thought to be caused in part by the same factors that cause GERD. People who do not have heartburn can have Barrett's esophagus. However, it is found about 3 to 5 times more often in people with this condition. Barrett's esophagus is uncommon in children. The average age at diagnosis is 12. But it is usually difficult to know when the problem started. It is about twice as common in men as in women. It is much more common in white men than in men of  other races. BARRETT'S ESOPHAGUS AND CANCER OF THE ESOPHAGUS Barrett's esophagus does not cause symptoms itself. However, it seems to precede the development of a particular kind of cancer. This cancer is esophageal adenocarcinoma. The risk of developing this cancer is 30 to 125 times higher in people who have Barrett's esophagus than in people who do not. This type of cancer is increasing quickly in white men. The increase is possibly related to the rise in obesity and GERD. For people who have Barrett's esophagus, the risk of getting cancer of the esophagus is small. It is less than 1 percent (0.4% to 0.5%) per year. Esophageal adenocarcinoma is often not curable. This is partly because the disease is often  discovered at a late stage and treatments are not effective. DIAGNOSIS Diagnosing Barrett's esophagus is not easy. At the present time it cannot be diagnosed based on symptoms, physical exam, or blood tests. The only useful test is upper gastrointestinal endoscopy and biopsy. In this procedure, a flexible tube called an endoscope is used. This tool is like a pencil sized flexible telescope. It has a light and tiny camera. It is passed into the esophagus. If the tissue appears suspicious to your caregiver, biopsies must be done. A biopsy is the removal of a small piece of tissue. This is done using a pincher-like device passed through the endoscope. A pathologist is a specialist who examines body tissue samples. He or she examines the tissue under a microscope to confirm the diagnosis. Looking for a medical problem in people who do not know whether they have one is called screening. Currently, there are no commonly accepted guidelines on who should have endoscopy, to check for Barrett's esophagus. There are many reasons for the lack of firm recommendations about screening. Among them are the great cost and occasional risk of side effects of the test. Also, the rate of finding Barrett's esophagus is low. Finding the problem early has not been proven to prevent deaths from cancer. Many caregivers advise that adult patients who are over the age of 36 and have had GERD symptoms for a number of years have endoscopy, to see whether they have Barrett's esophagus. Screening for this condition in people who have no symptoms is not advised. TREATMENT  Barrett's esophagus has no cure, other than surgical removal of the esophagus. This is a serious operation. Surgery is advised only for people who have a high risk of developing cancer or who already have it. Most caregivers recommend treating GERD with acid-blocking drugs. This is sometimes linked to improvement in the extent of the Barrett's tissue. But this approach has  not been proven to reduce the risk of cancer. Treating reflux with surgery for GERD also does not seem to cure Barrett's esophagus. Several experimental approaches are under study. One attempts to see whether destroying the Barrett's tissue by heat or other means, through an endoscope, can get rid of the condition. But this approach has potential risks and unknown effectiveness. LOOKING FOR DYSPLASIA AND CANCER Occasional endoscopic examinations to look for early warning signs of cancer are generally advised for people who have Barrett's esophagus. This approach is called surveillance. When people who have Barrett's esophagus develop cancer, the process seems to go through an intermediate stage. In this stage cancer cells appear in the Barrett's tissue. This condition is called dysplasia. It can be seen only in biopsies with a microscope. The process is patchy and cannot be seen directly through the endoscope. So, multiple biopsies must be taken. Even  then, it can be missed. The process of change from Barrett's to cancer seems to happen only in a few patients. It is less than 1 percent per year. And it happens over a relatively long period of time. Most caregivers advise that patients with Barrett's esophagus undergo occasional endoscopy to have biopsies. The recommended time between endoscopies varies depending on circumstances. The best time interval has not been decided. TREATMENT FOR DYSPLASIA OR ESOPHAGEAL ADENOCARCINOMA If a person with Barrett's esophagus is found to have dysplasia or cancer, the caregiver will usually recommend surgery. This is if the person is strong enough and has a good chance of being cured. The type of surgery may vary. But it usually involves removing most of the esophagus and pulling the stomach up into the chest to attach it to what remains of the esophagus. Many patients with Barrett's esophagus are elderly. They may have many other medical problems that make surgery unwise.  In these patients, other methods to treat dysplasia are being studied. IMPORTANT POINTS TO REMEMBER  In Barrett's esophagus, the cells lining the esophagus change. They become similar to the cells lining the intestine.   Barrett's esophagus is connected with gastroesophageal reflux disease or GERD.   A small number of people with Barrett's esophagus may develop esophageal cancer.   Barrett's esophagus is diagnosed by upper gastrointestinal endoscopy and biopsy.   People who have Barrett's esophagus should have periodic esophageal exams.   Taking acid-blocking drugs for GERD may help improve Barrett's esophagus.   Removal of the esophagus is recommended only for people who have a high risk of developing cancer or who already have it.  FOR MORE INFORMATION International Foundation for Functional Gastrointestinal Disorders (IFFGD): www.iffgd.org Document Released: 01/08/2004 Document Revised: 10/07/2011 Document Reviewed: 10/14/2008 [facility] Patient Information 2012 [place], [place].

## 2012-01-12 NOTE — Progress Notes (Signed)
Primary Care Physician: Colette Ribas, MD, MD  Primary Gastroenterologist:  Jonette Eva, MD   Chief Complaint  Patient presents with  . Follow-up    HPI: [name] is a 51 y.o. male here for 3 month f/u EGD. H/O Barrett's esophagus, chronic GERD. EGD showed Barrett's without dysplasia recently. He also had distal esophagitis, gastritis/duodenitis. Patient is supposed to be on pantoprazole daily. He has RX bottle from 10/2011 that still has 10+ pills left. He thinks he took samples of PPI and therefore has not run out of pantoprazole. His wife states he has been having memory issues and plans to see PCP in near future. He also has loose ends regarding urinary issues.   Dysphagia resolved.  Current Outpatient Prescriptions  Medication Sig Dispense Refill  . aspirin 81 MG tablet Take 81 mg by mouth daily.       Marland Kitchen atorvastatin (LIPITOR) 40 MG tablet Take 40 mg by mouth daily.       Marland Kitchen EFFIENT 10 MG TABS Take 10 mg by mouth every morning.       . furosemide (LASIX) 40 MG tablet Take 40 mg by mouth every morning.       . isosorbide mononitrate (IMDUR) 60 MG 24 hr tablet Take 60 mg by mouth 2 (two) times daily.       Marland Kitchen lisinopril (PRINIVIL,ZESTRIL) 5 MG tablet Take 5 mg by mouth every morning.       . metoprolol tartrate (LOPRESSOR) 25 MG tablet Take 25 mg by mouth 2 (two) times daily.       . niacin (NIASPAN) 1000 MG CR tablet Take 1,000 mg by mouth at bedtime.       . nitroGLYCERIN (NITROLINGUAL) 0.4 MG/SPRAY spray Place 1 spray under the tongue every 5 (five) minutes as needed. FOR CHEST PAIN        . pantoprazole (PROTONIX) 40 MG tablet 1 po 30 minutes prior to first meal once a day  30 tablet  11  . potassium chloride SA (K-DUR,KLOR-CON) 20 MEQ tablet Take 20 mEq by mouth 2 (two) times daily.        . ranolazine (RANEXA) 500 MG 12 hr tablet Take 500 mg by mouth 2 (two) times daily.       . TRILIPIX 135 MG capsule Take 135 mg by mouth every morning.       . rosuvastatin  (CRESTOR) 20 MG tablet Take 20 mg by mouth every morning.         Allergies as of 01/12/2012 - Review Complete 01/12/2012  Allergen Reaction Noted  . Iohexol  01/02/2008    ROS:  General: Negative for anorexia, weight loss, fever, chills, fatigue, weakness. ENT: Negative for hoarseness, difficulty swallowing , nasal congestion. CV: Negative for chest pain, angina, palpitations, dyspnea on exertion, peripheral edema.  Respiratory: Negative for dyspnea at rest, dyspnea on exertion, cough, sputum, wheezing.  GI: See history of present illness. GU:  Negative for dysuria, hematuria, urinary incontinence, urinary frequency, nocturnal urination.  Endo: Negative for unusual weight change.  Neuro: See HPI.   Physical Examination:   BP 111/73  Pulse 75  Temp(Src) 98.5 F (36.9 C) (Temporal)  Ht 5\' 10"  (1.778 m)  Wt 306 lb 3.2 oz (138.891 kg)  BMI 43.93 kg/m2  General: Obese, WM in NAD. Accompanied by wife.  Eyes: No icterus. Mouth: Oropharyngeal mucosa moist and pink , no lesions erythema or exudate. Lungs: Clear to auscultation bilaterally.  Heart: Regular rate and rhythm, no murmurs rubs  or gallops.  Abdomen: Bowel sounds are normal, nontender, nondistended, no hepatosplenomegaly or masses, no abdominal bruits or hernia , no rebound or guarding.   Extremities: No lower extremity edema. No clubbing or deformities. Neuro: Alert and oriented x 4   Skin: Warm and dry, no jaundice.   Psych: Alert and cooperative, normal mood and affect.

## 2012-01-13 ENCOUNTER — Encounter: Payer: Self-pay | Admitting: Gastroenterology

## 2012-01-13 NOTE — Progress Notes (Signed)
Cc to PCP 

## 2012-01-13 NOTE — Assessment & Plan Note (Signed)
Chronic GERD with Barrett's esophagus. Asymptomatic. Stressed importance of taken pantoprazole daily. Discussed antireflux measures. He needs to loose some weight, first goal of 15 pounds in next 4 months. He has poor exercise tolerance with numerous medical issues so he must make some dietary changes.   Regarding memory, cardiac, urinary issues (chronic hematuria), I have encouraged him to discuss with PCP. He is in agreement and plans to make appointment asap.  OV in one year.

## 2012-01-21 DIAGNOSIS — R413 Other amnesia: Secondary | ICD-10-CM | POA: Diagnosis not present

## 2012-01-21 DIAGNOSIS — E119 Type 2 diabetes mellitus without complications: Secondary | ICD-10-CM | POA: Diagnosis not present

## 2012-01-21 DIAGNOSIS — Z6841 Body Mass Index (BMI) 40.0 and over, adult: Secondary | ICD-10-CM | POA: Diagnosis not present

## 2012-02-13 NOTE — Progress Notes (Signed)
REVIEWED.  

## 2012-02-18 DIAGNOSIS — E119 Type 2 diabetes mellitus without complications: Secondary | ICD-10-CM | POA: Diagnosis not present

## 2012-02-18 DIAGNOSIS — Z6841 Body Mass Index (BMI) 40.0 and over, adult: Secondary | ICD-10-CM | POA: Diagnosis not present

## 2012-02-18 DIAGNOSIS — I10 Essential (primary) hypertension: Secondary | ICD-10-CM | POA: Diagnosis not present

## 2012-02-18 DIAGNOSIS — E785 Hyperlipidemia, unspecified: Secondary | ICD-10-CM | POA: Diagnosis not present

## 2012-02-23 DIAGNOSIS — E119 Type 2 diabetes mellitus without complications: Secondary | ICD-10-CM | POA: Diagnosis not present

## 2012-02-23 DIAGNOSIS — H40009 Preglaucoma, unspecified, unspecified eye: Secondary | ICD-10-CM | POA: Diagnosis not present

## 2012-02-28 DIAGNOSIS — M810 Age-related osteoporosis without current pathological fracture: Secondary | ICD-10-CM | POA: Diagnosis not present

## 2012-02-28 DIAGNOSIS — G4733 Obstructive sleep apnea (adult) (pediatric): Secondary | ICD-10-CM | POA: Diagnosis not present

## 2012-02-28 DIAGNOSIS — Z79899 Other long term (current) drug therapy: Secondary | ICD-10-CM | POA: Diagnosis not present

## 2012-02-28 DIAGNOSIS — R569 Unspecified convulsions: Secondary | ICD-10-CM | POA: Diagnosis not present

## 2012-02-28 DIAGNOSIS — R5381 Other malaise: Secondary | ICD-10-CM | POA: Diagnosis not present

## 2012-02-28 DIAGNOSIS — R413 Other amnesia: Secondary | ICD-10-CM | POA: Diagnosis not present

## 2012-02-28 DIAGNOSIS — R5383 Other fatigue: Secondary | ICD-10-CM | POA: Diagnosis not present

## 2012-03-06 DIAGNOSIS — E782 Mixed hyperlipidemia: Secondary | ICD-10-CM | POA: Diagnosis not present

## 2012-03-06 DIAGNOSIS — I10 Essential (primary) hypertension: Secondary | ICD-10-CM | POA: Diagnosis not present

## 2012-03-06 DIAGNOSIS — I2510 Atherosclerotic heart disease of native coronary artery without angina pectoris: Secondary | ICD-10-CM | POA: Diagnosis not present

## 2012-03-06 DIAGNOSIS — F17200 Nicotine dependence, unspecified, uncomplicated: Secondary | ICD-10-CM | POA: Diagnosis not present

## 2012-03-09 ENCOUNTER — Other Ambulatory Visit: Payer: Self-pay

## 2012-03-09 DIAGNOSIS — G4700 Insomnia, unspecified: Secondary | ICD-10-CM

## 2012-03-10 DIAGNOSIS — R569 Unspecified convulsions: Secondary | ICD-10-CM | POA: Diagnosis not present

## 2012-03-12 ENCOUNTER — Ambulatory Visit: Payer: BC Managed Care – PPO | Attending: Neurology | Admitting: Sleep Medicine

## 2012-03-12 DIAGNOSIS — G4733 Obstructive sleep apnea (adult) (pediatric): Secondary | ICD-10-CM | POA: Insufficient documentation

## 2012-03-12 DIAGNOSIS — M799 Soft tissue disorder, unspecified: Secondary | ICD-10-CM | POA: Insufficient documentation

## 2012-03-12 DIAGNOSIS — G4730 Sleep apnea, unspecified: Secondary | ICD-10-CM

## 2012-03-18 NOTE — Procedures (Signed)
NAMEALDER, MURRI            ACCOUNT NO.:  [id number]  MEDICAL RECORD NO.:  [id number]          PATIENT TYPE:  OUT  LOCATION:  SLEEP LAB                     FACILITY:  [facility]  PHYSICIAN:  Deniah Saia A. Gerilyn Pilgrim, M.D. DATE OF BIRTH:  [birth year]  DATE OF STUDY:  03/12/2012                           NOCTURNAL POLYSOMNOGRAM  REFERRING PHYSICIAN:  Houston Surges A. Gerilyn Pilgrim, M.D.  INDICATION:  A 51 year old who presents with fatigue, witnessed apnea, and obesity.  The study is being done to evaluate for obstructive sleep apnea syndrome.   MEDICATIONS:  Aspirin, lisinopril, Protonix, Lasix, metoprolol, Imdur, niacin, Lipitor, Januvia.  EPWORTH SLEEPINESS SCALE:  0.  BMI 44.  ARCHITECTURAL SUMMARY:  The total recording time is 475 minutes, sleep efficiency 70%, sleep latency 18 minutes, REM latency 177 minutes. Stage N1 10%, N2 71%, N3 11%, and REM sleep 8%.  RESPIRATORY SUMMARY:  Baseline oxygen saturation is 95, lowest saturation 85 during the REM sleep.  Diagnostic AHI is 16 and RDI 17.  LIMB MOVEMENT SUMMARY:  PLM index 58.  ELECTROCARDIOGRAM SUMMARY:  Average heart rate is 80 with no significant dysrhythmias observed.  IMPRESSION: 1. Moderate obstructive sleep apnea syndrome. 2. Severe periodic limb movement disorder sleep.   Yolando Gillum A. Gerilyn Pilgrim, M.D.    KAD/MEDQ  D:  03/18/2012 16:26:16  T:  03/18/2012 22:44:46  Job:  [id number]

## 2012-06-22 DIAGNOSIS — E785 Hyperlipidemia, unspecified: Secondary | ICD-10-CM | POA: Diagnosis not present

## 2012-06-22 DIAGNOSIS — Z6841 Body Mass Index (BMI) 40.0 and over, adult: Secondary | ICD-10-CM | POA: Diagnosis not present

## 2012-06-22 DIAGNOSIS — I10 Essential (primary) hypertension: Secondary | ICD-10-CM | POA: Diagnosis not present

## 2012-06-22 DIAGNOSIS — IMO0001 Reserved for inherently not codable concepts without codable children: Secondary | ICD-10-CM | POA: Diagnosis not present

## 2012-06-22 DIAGNOSIS — Z719 Counseling, unspecified: Secondary | ICD-10-CM | POA: Diagnosis not present

## 2012-09-18 ENCOUNTER — Other Ambulatory Visit (HOSPITAL_COMMUNITY): Payer: Self-pay | Admitting: Gastroenterology

## 2012-09-25 DIAGNOSIS — E785 Hyperlipidemia, unspecified: Secondary | ICD-10-CM | POA: Diagnosis not present

## 2012-09-25 DIAGNOSIS — Z23 Encounter for immunization: Secondary | ICD-10-CM | POA: Diagnosis not present

## 2012-09-25 DIAGNOSIS — Z719 Counseling, unspecified: Secondary | ICD-10-CM | POA: Diagnosis not present

## 2012-09-25 DIAGNOSIS — E109 Type 1 diabetes mellitus without complications: Secondary | ICD-10-CM | POA: Diagnosis not present

## 2012-09-25 DIAGNOSIS — I10 Essential (primary) hypertension: Secondary | ICD-10-CM | POA: Diagnosis not present

## 2012-09-25 DIAGNOSIS — Z6841 Body Mass Index (BMI) 40.0 and over, adult: Secondary | ICD-10-CM | POA: Diagnosis not present

## 2012-12-06 ENCOUNTER — Other Ambulatory Visit (HOSPITAL_COMMUNITY): Payer: Self-pay | Admitting: Cardiovascular Disease

## 2012-12-06 DIAGNOSIS — R079 Chest pain, unspecified: Secondary | ICD-10-CM

## 2012-12-06 DIAGNOSIS — R0600 Dyspnea, unspecified: Secondary | ICD-10-CM

## 2012-12-06 DIAGNOSIS — I2510 Atherosclerotic heart disease of native coronary artery without angina pectoris: Secondary | ICD-10-CM

## 2012-12-06 DIAGNOSIS — I509 Heart failure, unspecified: Secondary | ICD-10-CM

## 2012-12-15 ENCOUNTER — Ambulatory Visit (HOSPITAL_COMMUNITY): Payer: BC Managed Care – PPO

## 2012-12-15 ENCOUNTER — Encounter (HOSPITAL_COMMUNITY): Payer: BC Managed Care – PPO

## 2012-12-18 ENCOUNTER — Ambulatory Visit (HOSPITAL_COMMUNITY)
Admission: RE | Admit: 2012-12-18 | Discharge: 2012-12-18 | Disposition: A | Discharge status: Home / Self Care | Payer: BC Managed Care – PPO | Source: Ambulatory Visit | Attending: Family Medicine | Admitting: Family Medicine

## 2012-12-18 ENCOUNTER — Other Ambulatory Visit (HOSPITAL_COMMUNITY): Payer: Self-pay | Admitting: Family Medicine

## 2012-12-18 DIAGNOSIS — R0989 Other specified symptoms and signs involving the circulatory and respiratory systems: Secondary | ICD-10-CM | POA: Insufficient documentation

## 2012-12-18 DIAGNOSIS — Z6841 Body Mass Index (BMI) 40.0 and over, adult: Secondary | ICD-10-CM | POA: Diagnosis not present

## 2012-12-18 DIAGNOSIS — J449 Chronic obstructive pulmonary disease, unspecified: Secondary | ICD-10-CM

## 2012-12-18 DIAGNOSIS — J4489 Other specified chronic obstructive pulmonary disease: Secondary | ICD-10-CM | POA: Insufficient documentation

## 2012-12-18 DIAGNOSIS — I10 Essential (primary) hypertension: Secondary | ICD-10-CM | POA: Diagnosis not present

## 2012-12-18 DIAGNOSIS — E1159 Type 2 diabetes mellitus with other circulatory complications: Secondary | ICD-10-CM | POA: Diagnosis not present

## 2012-12-18 DIAGNOSIS — Z719 Counseling, unspecified: Secondary | ICD-10-CM | POA: Diagnosis not present

## 2012-12-18 DIAGNOSIS — R0689 Other abnormalities of breathing: Secondary | ICD-10-CM

## 2012-12-18 DIAGNOSIS — E785 Hyperlipidemia, unspecified: Secondary | ICD-10-CM | POA: Diagnosis not present

## 2012-12-22 ENCOUNTER — Encounter: Payer: Self-pay | Admitting: *Deleted

## 2012-12-28 ENCOUNTER — Ambulatory Visit (HOSPITAL_COMMUNITY)
Admission: RE | Admit: 2012-12-28 | Discharge: 2012-12-28 | Disposition: A | Discharge status: Home / Self Care | Payer: BC Managed Care – PPO | Source: Ambulatory Visit | Attending: Cardiovascular Disease | Admitting: Cardiovascular Disease

## 2012-12-28 ENCOUNTER — Other Ambulatory Visit: Payer: Self-pay | Admitting: *Deleted

## 2012-12-28 DIAGNOSIS — I080 Rheumatic disorders of both mitral and aortic valves: Secondary | ICD-10-CM | POA: Diagnosis not present

## 2012-12-28 DIAGNOSIS — R0600 Dyspnea, unspecified: Secondary | ICD-10-CM

## 2012-12-28 DIAGNOSIS — I509 Heart failure, unspecified: Secondary | ICD-10-CM | POA: Diagnosis not present

## 2012-12-28 DIAGNOSIS — R079 Chest pain, unspecified: Secondary | ICD-10-CM

## 2012-12-28 DIAGNOSIS — I2510 Atherosclerotic heart disease of native coronary artery without angina pectoris: Secondary | ICD-10-CM | POA: Insufficient documentation

## 2012-12-28 DIAGNOSIS — E119 Type 2 diabetes mellitus without complications: Secondary | ICD-10-CM | POA: Diagnosis not present

## 2012-12-28 DIAGNOSIS — F17200 Nicotine dependence, unspecified, uncomplicated: Secondary | ICD-10-CM | POA: Insufficient documentation

## 2012-12-28 MED ORDER — TECHNETIUM TC 99M SESTAMIBI GENERIC - CARDIOLITE
30.0000 | Freq: Once | INTRAVENOUS | Status: AC | PRN
  Administered 2012-12-28: 30 via INTRAVENOUS

## 2012-12-28 MED ORDER — TECHNETIUM TC 99M SESTAMIBI GENERIC - CARDIOLITE
10.0000 | Freq: Once | INTRAVENOUS | Status: AC | PRN
  Administered 2012-12-28: 10 via INTRAVENOUS

## 2012-12-28 MED ORDER — AMINOPHYLLINE 25 MG/ML IV SOLN
175.0000 mg | Freq: Once | INTRAVENOUS | Status: AC
  Administered 2012-12-28: 175 mg via INTRAVENOUS

## 2012-12-28 MED ORDER — REGADENOSON 0.4 MG/5ML IV SOLN
0.4000 mg | Freq: Once | INTRAVENOUS | Status: AC
  Administered 2012-12-28: 0.4 mg via INTRAVENOUS

## 2012-12-28 NOTE — Procedures (Addendum)
[facility] [address] [place] [address] [phone]  Cardiology Nuclear Med Study  [name] is a 52 y.o. male     MRN : [id number]     DOB: 1960/11/17  Procedure Date: 12/28/2012  Nuclear Med Background Indication for Stress Test:  Evaluation for Ischemia, Graft Patency, Stent Patency and PTCA Patency History:  COPD and CAD;MI;CABG-10/2007;PTCA/STENT-07/2010 Cardiac Risk Factors: Family History - CAD, Hypertension, Lipids, NIDDM, Obesity and Smoker  Symptoms:  Chest Pain, Dizziness, DOE, Fatigue, Light-Headedness, Palpitations and SOB   Nuclear Pre-Procedure Caffeine/Decaff Intake:  7:00pm NPO After: 5:00am   IV Site: R Forearm  IV 0.9% NS with Angio Cath:  22g  Chest Size (in):  52" IV Started by: Emmit Pomfret, RN  Height: 5\' 10"  (1.778 m)  Cup Size: n/a  BMI:  Body mass index is 42.18 kg/(m^2). Weight:  294 lb (133.358 kg)   Tech Comments:  N/A    Nuclear Med Study 1 or 2 day study: 1 day  Stress Test Type:  Lexiscan  Order Authorizing Provider:  MIHAI CROITORU,MD    Resting Radionuclide: Technetium 48m Sestamibi  Resting Radionuclide Dose: 10.5 mCi   Stress Radionuclide:  Technetium 39m Sestamibi  Stress Radionuclide Dose: 30.8 mCi           Stress Protocol Rest HR: 66 Stress HR: 88  Rest BP: 108/81 Stress BP: 120/47  Exercise Time (min): n/a METS: n/a          Dose of Adenosine (mg):  n/a Dose of Lexiscan: 0.4 mg  Dose of Atropine (mg): n/a Dose of Dobutamine: n/a mcg/kg/min (at max HR)  Stress Test Technologist: Ernestene Mention, CCT Nuclear Technologist: Koren Shiver, CNMT   Rest Procedure:  Myocardial perfusion imaging was performed at rest 45 minutes following the intravenous administration of Technetium 73m Sestamibi. Stress Procedure:  The patient received IV Lexiscan 0.4 mg over 15-seconds.  Technetium 2m Sestamibi injected at 30-seconds.  Due to patient's symptoms of shortness of  breath, chest discomfort and drop in blood pressure(85/53) during the first minute into recovery, he was given IV Aminophylline 175 mg. Symptoms did resolve after a 10 min. recovery period.There were no significant changes with Lexiscan.  Quantitative spect images were obtained after a 45 minute delay.  Transient Ischemic Dilatation (Normal <1.22):  1.23 Lung/Heart Ratio (Normal <0.45):  0.38 QGS EDV:  104 ml QGS ESV:  50 ml LV Ejection Fraction: 52%  Rest ECG: NSR - Normal EKG  Stress ECG: No significant change from baseline ECG  QPS Raw Data Images:  Mild diaphragmatic attenuation.  Normal left ventricular size. Stress Images:  There is a moderate sized defect of the basal to apical inferior walls and mid to apical lateral walls Rest Images:  Moderate sized basal to mid inferior and apical inferior defect. Subtraction (SDS):  Fixed inferior defect with reversible apical inferior and lateral ischemia.  Impression Exercise Capacity:  Lexiscan with no exercise. BP Response:  Hypotensive blood pressure response. Clinical Symptoms:  Mild chest pain/dyspnea. ECG Impression:  No significant ECG changes with Lexiscan. Comparison with Prior Nuclear Study: *There appears to be persistent inferior infarct with superimposed apical and apical lateral reversible ischemia which is new compared to a study in 12/2010.  Overall Impression:  Intermediate stress nuclear study. Small to moderate-sized area of reversible ischemia with fixed inferior scar.  LV Wall Motion:  LVEF 52%. Inferior hypokinesis.  Chrystie Nose, MD, [facility] Board Certified in Nuclear Cardiology  Attending Cardiologist [facility]  Chrystie Nose, MD  12/28/2012 1:01 PM

## 2012-12-28 NOTE — Progress Notes (Signed)
2D Echo Performed 12/28/2012    Ta Fair, RCS  

## 2012-12-29 ENCOUNTER — Encounter (HOSPITAL_COMMUNITY)
Admission: RE | Disposition: A | Discharge status: Home / Self Care | Payer: Self-pay | Source: Ambulatory Visit | Attending: Cardiovascular Disease

## 2012-12-29 ENCOUNTER — Encounter: Payer: Self-pay | Admitting: *Deleted

## 2012-12-29 ENCOUNTER — Ambulatory Visit (HOSPITAL_COMMUNITY)
Admission: RE | Admit: 2012-12-29 | Discharge: 2012-12-30 | Disposition: A | Discharge status: Home / Self Care | Payer: BC Managed Care – PPO | Source: Ambulatory Visit | Attending: Cardiovascular Disease | Admitting: Cardiovascular Disease

## 2012-12-29 DIAGNOSIS — E785 Hyperlipidemia, unspecified: Secondary | ICD-10-CM | POA: Insufficient documentation

## 2012-12-29 DIAGNOSIS — R0602 Shortness of breath: Secondary | ICD-10-CM | POA: Insufficient documentation

## 2012-12-29 DIAGNOSIS — I25810 Atherosclerosis of coronary artery bypass graft(s) without angina pectoris: Secondary | ICD-10-CM | POA: Insufficient documentation

## 2012-12-29 DIAGNOSIS — I10 Essential (primary) hypertension: Secondary | ICD-10-CM | POA: Diagnosis present

## 2012-12-29 DIAGNOSIS — I2582 Chronic total occlusion of coronary artery: Secondary | ICD-10-CM | POA: Diagnosis not present

## 2012-12-29 DIAGNOSIS — F17200 Nicotine dependence, unspecified, uncomplicated: Secondary | ICD-10-CM | POA: Insufficient documentation

## 2012-12-29 DIAGNOSIS — G2581 Restless legs syndrome: Secondary | ICD-10-CM | POA: Diagnosis not present

## 2012-12-29 DIAGNOSIS — I2510 Atherosclerotic heart disease of native coronary artery without angina pectoris: Secondary | ICD-10-CM | POA: Diagnosis present

## 2012-12-29 DIAGNOSIS — R079 Chest pain, unspecified: Secondary | ICD-10-CM | POA: Diagnosis not present

## 2012-12-29 DIAGNOSIS — E782 Mixed hyperlipidemia: Secondary | ICD-10-CM | POA: Diagnosis present

## 2012-12-29 DIAGNOSIS — E119 Type 2 diabetes mellitus without complications: Secondary | ICD-10-CM | POA: Insufficient documentation

## 2012-12-29 DIAGNOSIS — I200 Unstable angina: Secondary | ICD-10-CM | POA: Diagnosis present

## 2012-12-29 DIAGNOSIS — G4733 Obstructive sleep apnea (adult) (pediatric): Secondary | ICD-10-CM | POA: Diagnosis present

## 2012-12-29 DIAGNOSIS — R9439 Abnormal result of other cardiovascular function study: Secondary | ICD-10-CM | POA: Diagnosis not present

## 2012-12-29 DIAGNOSIS — I219 Acute myocardial infarction, unspecified: Secondary | ICD-10-CM

## 2012-12-29 HISTORY — PX: CORONARY STENT PLACEMENT: SHX1402

## 2012-12-29 HISTORY — PX: LEFT HEART CATHETERIZATION WITH CORONARY/GRAFT ANGIOGRAM: SHX5450

## 2012-12-29 HISTORY — PX: PERCUTANEOUS CORONARY STENT INTERVENTION (PCI-S): SHX5485

## 2012-12-29 LAB — PROTIME-INR: INR: 1 (ref 0.00–1.49)

## 2012-12-29 LAB — COMPREHENSIVE METABOLIC PANEL
ALT: 22 U/L (ref 0–53)
AST: 15 U/L (ref 0–37)
Calcium: 9.3 mg/dL (ref 8.4–10.5)
Creatinine, Ser: 1.04 mg/dL (ref 0.50–1.35)
GFR calc Af Amer: >90 mL/min (ref >90)
Sodium: 141 mEq/L (ref 135–145)
Total Protein: 6.2 g/dL (ref 6.0–8.3)

## 2012-12-29 LAB — CBC
MCHC: 34.9 g/dL (ref 30.0–36.0)
Platelets: 297 10*3/uL (ref 150–400)
RDW: 12.9 % (ref 11.5–15.5)
WBC: 11.1 10*3/uL — ABNORMAL HIGH (ref 4.0–10.5)

## 2012-12-29 LAB — POCT ACTIVATED CLOTTING TIME: Activated Clotting Time: 579 seconds

## 2012-12-29 SURGERY — LEFT HEART CATHETERIZATION WITH CORONARY/GRAFT ANGIOGRAM
Anesthesia: LOCAL

## 2012-12-29 MED ORDER — INSULIN ASPART 100 UNIT/ML ~~LOC~~ SOLN
0.0000 [IU] | Freq: Every day | SUBCUTANEOUS | Status: DC
  Administered 2012-12-29: 23:00:00 5 [IU] via SUBCUTANEOUS

## 2012-12-29 MED ORDER — POTASSIUM CHLORIDE CRYS ER 20 MEQ PO TBCR
20.0000 meq | EXTENDED_RELEASE_TABLET | Freq: Two times a day (BID) | ORAL | Status: DC
  Administered 2012-12-29: 23:00:00 20 meq via ORAL
  Filled 2012-12-29 (×3): qty 1

## 2012-12-29 MED ORDER — LINAGLIPTIN 5 MG PO TABS
5.0000 mg | ORAL_TABLET | Freq: Every day | ORAL | Status: DC
  Filled 2012-12-29 (×2): qty 1

## 2012-12-29 MED ORDER — SODIUM CHLORIDE 0.9 % IV SOLN
INTRAVENOUS | Status: DC
  Administered 2012-12-29: 75 mL/h via INTRAVENOUS

## 2012-12-29 MED ORDER — FENTANYL CITRATE 0.05 MG/ML IJ SOLN
INTRAMUSCULAR | Status: AC
  Filled 2012-12-29: qty 2

## 2012-12-29 MED ORDER — MIDAZOLAM HCL 2 MG/2ML IJ SOLN
INTRAMUSCULAR | Status: AC
  Filled 2012-12-29: qty 2

## 2012-12-29 MED ORDER — NITROGLYCERIN 1 MG/10 ML FOR IR/CATH LAB
INTRA_ARTERIAL | Status: AC
  Filled 2012-12-29: qty 10

## 2012-12-29 MED ORDER — ROSUVASTATIN CALCIUM 20 MG PO TABS
20.0000 mg | ORAL_TABLET | Freq: Every day | ORAL | Status: DC
  Administered 2012-12-29: 23:00:00 20 mg via ORAL
  Filled 2012-12-29 (×2): qty 1

## 2012-12-29 MED ORDER — ASPIRIN 81 MG PO CHEW
CHEWABLE_TABLET | ORAL | Status: AC
  Filled 2012-12-29: qty 4

## 2012-12-29 MED ORDER — FAMOTIDINE IN NACL 20-0.9 MG/50ML-% IV SOLN
20.0000 mg | INTRAVENOUS | Status: AC
  Administered 2012-12-29: 20 mg via INTRAVENOUS

## 2012-12-29 MED ORDER — ISOSORBIDE MONONITRATE ER 60 MG PO TB24
60.0000 mg | ORAL_TABLET | Freq: Two times a day (BID) | ORAL | Status: DC
  Administered 2012-12-29: 60 mg via ORAL
  Filled 2012-12-29 (×3): qty 1

## 2012-12-29 MED ORDER — FUROSEMIDE 80 MG PO TABS
80.0000 mg | ORAL_TABLET | Freq: Every day | ORAL | Status: DC
  Filled 2012-12-29: qty 1

## 2012-12-29 MED ORDER — INSULIN ASPART 100 UNIT/ML ~~LOC~~ SOLN: 0.0000 [IU] | Freq: Three times a day (TID) | SUBCUTANEOUS | Status: DC

## 2012-12-29 MED ORDER — NIACIN ER 500 MG PO CPCR
1000.0000 mg | ORAL_CAPSULE | Freq: Every day | ORAL | Status: DC
  Filled 2012-12-29 (×2): qty 2

## 2012-12-29 MED ORDER — SODIUM CHLORIDE 0.9 % IJ SOLN: 3.0000 mL | INTRAMUSCULAR | Status: DC | PRN

## 2012-12-29 MED ORDER — ASPIRIN 81 MG PO CHEW
324.0000 mg | CHEWABLE_TABLET | ORAL | Status: AC
  Administered 2012-12-29: 324 mg via ORAL

## 2012-12-29 MED ORDER — ONDANSETRON HCL 4 MG/2ML IJ SOLN: 4.0000 mg | Freq: Four times a day (QID) | INTRAMUSCULAR | Status: DC | PRN

## 2012-12-29 MED ORDER — PRASUGREL HCL 10 MG PO TABS
10.0000 mg | ORAL_TABLET | Freq: Every day | ORAL | Status: DC
  Filled 2012-12-29: qty 1

## 2012-12-29 MED ORDER — DIPHENHYDRAMINE HCL 50 MG/ML IJ SOLN
25.0000 mg | INTRAMUSCULAR | Status: AC
  Administered 2012-12-29: 09:00:00 via INTRAVENOUS

## 2012-12-29 MED ORDER — METHYLPREDNISOLONE SODIUM SUCC 125 MG IJ SOLR
125.0000 mg | INTRAMUSCULAR | Status: DC
  Administered 2012-12-29: 125 mg via INTRAVENOUS

## 2012-12-29 MED ORDER — NON FORMULARY: 20.0000 mg | Freq: Every day | Status: DC

## 2012-12-29 MED ORDER — LIDOCAINE HCL (PF) 1 % IJ SOLN
INTRAMUSCULAR | Status: AC
  Filled 2012-12-29: qty 30

## 2012-12-29 MED ORDER — SODIUM CHLORIDE 0.9 % IV SOLN
0.2500 mg/kg/h | INTRAVENOUS | Status: AC
  Administered 2012-12-29: 0.25 mg/kg/h via INTRAVENOUS
  Filled 2012-12-29: qty 250

## 2012-12-29 MED ORDER — HEPARIN (PORCINE) IN NACL 2-0.9 UNIT/ML-% IJ SOLN
INTRAMUSCULAR | Status: AC
  Filled 2012-12-29: qty 1000

## 2012-12-29 MED ORDER — DIPHENHYDRAMINE HCL 50 MG/ML IJ SOLN
INTRAMUSCULAR | Status: AC
  Filled 2012-12-29: qty 1

## 2012-12-29 MED ORDER — RANOLAZINE ER 500 MG PO TB12
1000.0000 mg | ORAL_TABLET | Freq: Two times a day (BID) | ORAL | Status: DC
  Administered 2012-12-29: 1000 mg via ORAL
  Filled 2012-12-29 (×3): qty 2

## 2012-12-29 MED ORDER — LISINOPRIL 5 MG PO TABS
5.0000 mg | ORAL_TABLET | Freq: Every day | ORAL | Status: DC
  Filled 2012-12-29: qty 1

## 2012-12-29 MED ORDER — ACETAMINOPHEN 325 MG PO TABS: 650.0000 mg | ORAL_TABLET | ORAL | Status: DC | PRN

## 2012-12-29 MED ORDER — FENOFIBRIC ACID 135 MG PO CPDR
135.0000 mg | DELAYED_RELEASE_CAPSULE | Freq: Every day | ORAL | Status: DC
  Filled 2012-12-29 (×2): qty 1

## 2012-12-29 MED ORDER — BIVALIRUDIN 250 MG IV SOLR
INTRAVENOUS | Status: AC
  Filled 2012-12-29: qty 250

## 2012-12-29 MED ORDER — NITROGLYCERIN 0.4 MG/SPRAY TL SOLN
1.0000 | Status: DC | PRN
  Filled 2012-12-29: qty 4.9

## 2012-12-29 MED ORDER — PANTOPRAZOLE SODIUM 40 MG PO TBEC: 40.0000 mg | DELAYED_RELEASE_TABLET | Freq: Every day | ORAL | Status: DC

## 2012-12-29 MED ORDER — METOPROLOL TARTRATE 50 MG PO TABS
50.0000 mg | ORAL_TABLET | Freq: Two times a day (BID) | ORAL | Status: DC
  Administered 2012-12-29: 23:00:00 50 mg via ORAL
  Filled 2012-12-29 (×3): qty 1

## 2012-12-29 MED ORDER — SODIUM CHLORIDE 0.9 % IV SOLN: INTRAVENOUS | Status: DC

## 2012-12-29 MED ORDER — ASPIRIN EC 81 MG PO TBEC
81.0000 mg | DELAYED_RELEASE_TABLET | Freq: Every day | ORAL | Status: DC
  Filled 2012-12-29: qty 1

## 2012-12-29 MED ORDER — METHYLPREDNISOLONE SODIUM SUCC 125 MG IJ SOLR
INTRAMUSCULAR | Status: AC
  Filled 2012-12-29: qty 2

## 2012-12-29 NOTE — Progress Notes (Signed)
Pt has refused CPAP for tonight. RT told pt he could bring his own machine from home for comfort. Pt says he will try to do without for one night being that he should be leaving tomorrow. Pt is currently on RA with o2 sats 93% and no apparent distress. Rt will assist as needed.

## 2012-12-29 NOTE — Interval H&P Note (Signed)
History and Physical Interval Note:  12/29/2012 9:24 AM  [name]  has presented today for surgery, with the diagnosis of angina  The various methods of treatment have been discussed with the patient and family. After consideration of risks, benefits and other options for treatment, the patient has consented to  Procedure(s): LEFT HEART CATHETERIZATION WITH CORONARY/GRAFT ANGIOGRAM (N/A) as a surgical intervention .  The patient's history has been reviewed, patient examined, no change in status, stable for surgery.  I have reviewed the patient's chart and labs.  Questions were answered to the patient's satisfaction.     [name] A

## 2012-12-29 NOTE — H&P (Signed)
  Updated H&P:  See dictated note by Dr. Royann Shivers. Pt had nuclear scan yesterday which revealed fixed inferior scar with additional apical lateral reversible ischemia. He is now scheduled for cardiac catherization with possible PCI by me today. No change in PEX. Pt has noted chest pain and shortness of breath with exertion. Discussed cath and possible PCI with patient and wife. Plan this am. [name] A 12/29/2012 .9:33 AM

## 2012-12-29 NOTE — Addendum Note (Signed)
Addended by: Nicki Guadalajara A on: 12/29/2012 08:30 AM   Modules accepted: Orders

## 2012-12-29 NOTE — Progress Notes (Signed)
Discussed home meds with patient, called Dr Rennis Golden to order home meds and bipap as at home and voiced concern about patient did not get lasix this am but will get 2L of IVF tonight.  Dr Rennis Golden advised only to give the first liter as ordered and he would reorder home meds in computer.  RT called for bipap as at home.

## 2012-12-29 NOTE — H&P (View-Only) (Signed)
2D Echo Performed 12/28/2012    Araminta Zorn, RCS  

## 2012-12-29 NOTE — CV Procedure (Signed)
Cardiac Catheterization/Complex PCI with cutting balloon/stenting to LCX ostium  [name], 52 y.o., male   Full note dictated; see diagram in chart  DICTATION # [phone], [id number]  AO: 96/63 LV: 96/12   LM: 30 - 40% mid LAD: 50 prox between dx1 and 2 with "flush and Fill" due to LIMA filling RI: occluded near ostially LCX: progressive 95% ostial, patent stent in OM RCA: occluded prox-mid  SVG to RCA: occluded SVG to RI: patent SVG to distal LCX: patent, but does not fill prox LCX territory or OM1 LIMA to LAD: patent  LV: EF 50 - 55% with inferior, inferolateral hypo-akinesis and mild upper posterolateral hypokinesis.  Complex but successful PCI with 2.0x6, 2.5x6 Angiosculpt, 2.25 x 12 DES Xience stent post-dilated with 2.5 x 12 [place] Trek  Angiomax/Efient/NTG  Tolerated well  Lennette Bihari, MD, [facility] 12/29/2012 2:45 PM

## 2012-12-30 ENCOUNTER — Encounter (HOSPITAL_COMMUNITY): Payer: Self-pay | Admitting: *Deleted

## 2012-12-30 DIAGNOSIS — I200 Unstable angina: Secondary | ICD-10-CM | POA: Diagnosis not present

## 2012-12-30 DIAGNOSIS — R9439 Abnormal result of other cardiovascular function study: Secondary | ICD-10-CM | POA: Diagnosis present

## 2012-12-30 DIAGNOSIS — E785 Hyperlipidemia, unspecified: Secondary | ICD-10-CM | POA: Diagnosis not present

## 2012-12-30 DIAGNOSIS — I25810 Atherosclerosis of coronary artery bypass graft(s) without angina pectoris: Secondary | ICD-10-CM | POA: Diagnosis not present

## 2012-12-30 DIAGNOSIS — E119 Type 2 diabetes mellitus without complications: Secondary | ICD-10-CM | POA: Diagnosis not present

## 2012-12-30 DIAGNOSIS — I2510 Atherosclerotic heart disease of native coronary artery without angina pectoris: Secondary | ICD-10-CM | POA: Diagnosis not present

## 2012-12-30 DIAGNOSIS — I2582 Chronic total occlusion of coronary artery: Secondary | ICD-10-CM | POA: Diagnosis not present

## 2012-12-30 HISTORY — PX: CORONARY STENT PLACEMENT: SHX1402

## 2012-12-30 LAB — BASIC METABOLIC PANEL
BUN: 17 mg/dL (ref 6–23)
Calcium: 9.4 mg/dL (ref 8.4–10.5)
Creatinine, Ser: 1.07 mg/dL (ref 0.50–1.35)
GFR calc Af Amer: >90 mL/min (ref >90)

## 2012-12-30 LAB — CBC
HCT: 42.6 % (ref 39.0–52.0)
MCH: 32.8 pg (ref 26.0–34.0)
MCHC: 35.2 g/dL (ref 30.0–36.0)
MCV: 93 fL (ref 78.0–100.0)
Platelets: 331 10*3/uL (ref 150–400)
RDW: 12.7 % (ref 11.5–15.5)

## 2012-12-30 MED ORDER — ASPIRIN EC 325 MG PO TBEC: 325.0000 mg | DELAYED_RELEASE_TABLET | Freq: Every day | ORAL | Status: DC

## 2012-12-30 MED ORDER — ASPIRIN 325 MG PO TBEC: 325.0000 mg | DELAYED_RELEASE_TABLET | Freq: Every day | ORAL | Status: DC

## 2012-12-30 NOTE — Progress Notes (Signed)
A&O x 3, denies complaints.  Dyspnea with minimal exertion.  O2 sat occasionally briefly dropped to 88% on ra when sleeping but increased to 92-94% without intervention.  Attempted to discuss diet and smoking cessation.  Pt voiced no interest in quitting smoking at this time.  Smoking cessation handout and groin care handout given.  Pt states drinks regular soda at home.  Encouraged pt to quit smoking and modify diet to heart healthy with no concentrated sweets.

## 2012-12-30 NOTE — Cardiovascular Report (Signed)
Ronnie, [name] NO.:  [id number]  MEDICAL RECORD NO.:  [id number]  LOCATION:  6531                         FACILITY:  [facility]  PHYSICIAN:  Nicki Guadalajara, M.D.     DATE OF BIRTH:  1961/02/09  DATE OF PROCEDURE: DATE OF DISCHARGE:                           CARDIAC CATHETERIZATION   Mr. [name] is a 52 year old gentleman who has a history of obesity, hyperlipidemia, type 2 diabetes mellitus, extensive coronary artery disease, and ongoing tobacco use.  He underwent CABG revascularization surgery in 2008, and had a LIMA placed to his LAD, a vein graft to ramus intermedius vessel, a vein graft to a distal circumflex coronary artery and a vein graft to his right coronary artery.  He also is status post stenting to the marginal vessel of the proximal circumflex.  This is not supplied by the graft, which fills the distal circumflex vessel.  The patient's last catheterization was in 2011 and he has documented total occlusion of the native right coronary artery as well as the vein graft to the right coronary artery.  At that time, he had a patent stent in the proximal left circumflex marginal vessel and a patent graft to his distal circumflex, which does not supply the AV groove or proximal area, and a patent vein graft to his optional diagonal and patent LIMA to LAD.  A nuclear stress test in March of 2012, showed basal and mid-inferior wall fixed perfusion defect without ischemia.  Recently, the patient has noticed increasing episodes of chest pain as well as exertional shortness of breath.  He underwent a nuclear perfusion study in our office yesterday, which again showed as old inferior scar, but now had new lateral reversible ischemia suggesting circumflex territory.  The patient is now brought in to undergo cardiac catheterization with possible intervention today if necessary.  PROCEDURE:  After premedication with Versed 2 mg plus fentanyl 50 mcg, the  patient was prepped and draped in usual fashion.  His right femoral artery was punctured anteriorly and a 5-French sheath was inserted without difficulty.  Diagnostic catheterization was done utilizing 5- [place] Judkins 4 left and right coronary catheters.  The right bypass graft catheter, left bypass graft catheter, and LIMA catheter were also used.  A 5-French pigtail catheter was used for biventriculography.  At this time, I broke scrub.  I compared the present findings to the past catheterization.  The catheterization today did show progressive ostial circumflex stenosis of 95%, which is jeopardizing the patent stent in the proximal marginal vessel.  This circumflex territory is not supplied by the patent grafts supplying the distal vessel.  At this point, I also brought Dr. Rennis Golden into the room who actually was the one who interpreted the nuclear scan.  We both felt an attempt at opening up the ostium of the native circumflex would be important to reduce his newly delineated lateral reversible ischemia.  At this point, the patient received an additional 1 mg of Versed plus fentanyl.  I will also add broken scrub and talk with the family in detail as well as the patient to describe the procedure and they can consented to have this intervention done. His 5-French sheath was then upgraded  to a 6-French sheath.  Angiomax bolus plus infusion was administered.  The patient had already received Effient 10 mg, which he has been on chronically.  A Voda 3.5 guide was used as the guiding catheter.  After demonstration of therapeutic anticoagulation, a Luge wire was able to navigate into this 90-degree takeoff circumflex and was able to cross the 95% ostial stenosis.  It was advanced into the distal aspect of the first marginal vessel, which again had a patent stent and was not bypassed.  Initially, a 2.0 x 6-mm AngioSculpt balloon was used for cutting balloon arthrotomy.  Several dilatations were  made ultimately up to 5, 7 and 9 atmospheres.  It was felt that additional dilatation was necessary.  A 2.5 x 6-mm AngioSculpt was then inserted and this was dilated at three multiple inflations at 4 and ultimately up to 6 atmospheres.  Of note, the stent in the circumflex vessel marginal, which had been placed remotely was a 2.25 x 32-mm stent.  A 2.25 x 12-mm Xience Xpedition stent was then inserted with very careful attention to make certain the ostium was covered by the stent.  This was dilated x2 at 12 and 13 atmospheres.  A 2.5 x 12-mm Trek balloon was used for poststent dilatation with inflations up to approximately 2.4 mm.  Scouty angiography confirmed an excellent angiographic result.  During the procedure, the patient did receive an additional 1 mg Versed and 25 mcg fentanyl.  He also was started on fluids at 150 mL an hour.  He tolerated the procedure well and will return to his room in satisfactory condition.  HEMODYNAMIC DATA:  Central aortic pressure was 96/62.  Left ventricular pressure was 96/12.  ANGIOGRAPHIC DATA:  Left main coronary artery had 30-40% mid narrowing, somewhat eccentric and gave rise to a LAD, ramus intermedius vessel, and left circumflex vessel.  The LAD gave rise to a proximal diagonal vessel and three septal perforating arteries.  In the region of the septal perforating arteries, there was 50% stenosis between the first and second diagonal vessel.  A "flush and fill" phenomena was seen in the mid LAD due to competitive filling via the LIMA graft.  There was also some distal collateralization to the RCA via the LIMA injection.  The ramus intermedius vessel was a nubbin and was totally occluded shortly after arising from the left main.  The circumflex vessel had 90-degree takeoff and had 95% new progressive ostial stenosis, which was proximal to the start of the previously placed stent in the marginal vessel.  The OM1 was otherwise widely patent.   The AV groove circumflex was occluded in its mid-segment and there was no filling of the distal circumflex.  The native right coronary artery was totally occluded in the proximal-to- midportion of the vessel and there as evidence for bridging collaterals resulting in some antegrade filling to the region of the acute margin.  The LIMA graft to the LAD was widely patent.  The vein graft supplying the ramus intermedius vessel was widely patent.  The vein graft supplying the distal circumflex vessel was widely patent. The distal circumflex vessel was very small caliber.  Several branches were seen into the distal portion of the AV groove circumflex and then, this portion was occluded such that the proximal circumflex was not supplied by the graft.  The vein graft supplying the right coronary artery was occluded, which was old.  RAO ventriculography revealed moderately severe hypo-to-akinesis of the mid inferior wall.  On the LAO  projection, there was a hypo-to-akinesis of the inferolateral segment.  There also was mild hypocontractility in the upper posterolateral wall.  Following successful percutaneous coronary intervention utilizing cutting balloon arthrotomy with a 2.0 and 2.5 AngioSculpt balloon with stenting with a 2.25 x 12-mm Xience Xpedition DES stent postdilated to approximately 2.4 mm, the ostial proximal circumflex vessel was reduced to 0%.  There was brisk TIMI-3 flow.  There was no evidence for dissection.  IMPRESSION: 1. Normal-to-mild left ventricular dysfunction with mid inferior wall     hypo-to-akinesis and inferolateral hypo-to-akinesis with mild upper     posterolateral hypocontractility with an ejection fraction     approximately 50%. 2. Severe native coronary artery disease with 30-40% eccentric     narrowing in the mid left main coronary artery; 50% proximal left     anterior descending artery stenosis; total occlusion of the     proximal ramus intermedius  vessel; progressive 95% ostial stenosis     in the circumflex coronary artery, proximal to a previously placed     proximal circumflex OM stent and total occlusion of the proximal-to-     mid right coronary artery. 3. Old occlusion of the vein graft, which had supplied the right     coronary artery. 4. Patent left internal mammary artery graft to the left anterior     descending artery. 5. Patent vein graft to the ramus intermedius vessel. 6. Patent vein graft to the distal circumflex vessel. 7. Successful complex percutaneous coronary intervention involving the     ostium and proximal portion of the left circumflex coronary artery     with cutting balloon arthrotomy and ultimate stenting with a 2.25 x     12-mm DES Xience Xpedition stent postdilated to 2.4 mm with the 95%     ostial stenosis being reduced to 0%. 8. Bivalirudin/IC nitroglycerin/the patient is on Effient in addition     to aspirin therapy.          ______________________________ Nicki Guadalajara, M.D.     TK/MEDQ  D:  12/29/2012  T:  12/30/2012  Job:  [id number]  cc:   Thurmon Fair, MD [place] Hilty, MD

## 2012-12-30 NOTE — Progress Notes (Signed)
CARDIAC REHAB PHASE I   PRE:  Rate/Rhythm: 89SR  BP:  Supine:   Sitting: 114/71  Standing:    SaO2: 95%RA  MODE:  Ambulation: 650 ft   POST:  Rate/Rhythm: 107ST  BP:  Supine:   Sitting: 82956  Standing:    SaO2: 89%RA to 93%RA 0830-922 Pt ambulated well independently, but heard some slight wheezing towards the end of the walk. Pt's SaO2 dropped to 89% during walk, then increased to 93% after slowing speed. SaO2 increased to 95% post-ambulation. Pt stated he experiences DOE at times, but understands that he must slow down. Educated pt on stent, diet, and exercise, with large emphasis on smoking cessation and understanding diabetes. Pt knows he must quit smoking, and after talking about smoking cessation, he seemed more motivated to participate. Pt had some interest in CRPII, but stated that he did not get much benefit when participating in CRPII in [place] four years ago. Returned pt to recliner with call light and phone within reach.   Deetta Perla

## 2012-12-30 NOTE — Progress Notes (Signed)
[facility]  DAILY PROGRESS NOTE   Subjective:  No events overnight. Refused CPAP - said he uses bipap at home. No chest pain. Ambulated without difficulty today. Says he feels like he has "more energy" today.  Objective:  Temp:  [96.6 F (35.9 [name])-98.6 F (37 [name])] 96.6 F (35.9 [name]) (03/01 0700) Pulse Rate:  [75-105] 83 (03/01 0700) Resp:  [20-21] 20 (03/01 0700) BP: (100-111)/(50-64) 100/50 mmHg (03/01 0700) SpO2:  [92 %-99 %] 96 % (03/01 0700) Weight:  [134.9 kg (297 lb 6.4 oz)] 134.9 kg (297 lb 6.4 oz) (03/01 0515) Weight change:   Intake/Output from previous day: 02/28 0701 - 03/01 0700 In: 1900 [P.O.:600; I.V.:1200] Out: 1000 [Urine:1000]  Intake/Output from this shift: Total I/O In: 240 [P.O.:240] Out: -   Medications: Current Facility-Administered Medications  Medication Dose Route Frequency Provider Last Rate Last Dose  . 0.9 %  sodium chloride infusion   Intravenous Continuous Lennette Bihari, MD      . acetaminophen (TYLENOL) tablet 650 mg  650 mg Oral Q4H PRN Lennette Bihari, MD      . aspirin EC tablet 81 mg  81 mg Oral Daily Lennette Bihari, MD      . Fenofibric Acid CPDR 135 mg  135 mg Oral QHS Chrystie Nose, MD      . furosemide (LASIX) tablet 80 mg  80 mg Oral Daily Chrystie Nose, MD      . insulin aspart (novoLOG) injection 0-15 Units  0-15 Units Subcutaneous TID WC Lenore Cordia, NP      . insulin aspart (novoLOG) injection 0-5 Units  0-5 Units Subcutaneous QHS Lenore Cordia, NP   5 Units at 12/29/12 2253  . isosorbide mononitrate (IMDUR) 24 hr tablet 60 mg  60 mg Oral BID Chrystie Nose, MD   60 mg at 12/29/12 2246  . linagliptin (TRADJENTA) tablet 5 mg  5 mg Oral Daily Chrystie Nose, MD      . lisinopril (PRINIVIL,ZESTRIL) tablet 5 mg  5 mg Oral Daily Chrystie Nose, MD      . metoprolol (LOPRESSOR) tablet 50 mg  50 mg Oral BID Chrystie Nose, MD   50 mg at 12/29/12 2246  . niacin CR capsule 1,000 mg  1,000  mg Oral QHS Chrystie Nose, MD      . nitroGLYCERIN (NITROLINGUAL) 0.4 MG/SPRAY spray 1 spray  1 spray Sublingual Q5 min PRN Chrystie Nose, MD      . ondansetron [facility]) injection 4 mg  4 mg Intravenous Q6H PRN Lennette Bihari, MD      . pantoprazole (PROTONIX) EC tablet 40 mg  40 mg Oral Daily Chrystie Nose, MD      . potassium chloride SA (K-DUR,KLOR-CON) CR tablet 20 mEq  20 mEq Oral BID Chrystie Nose, MD   20 mEq at 12/29/12 2246  . prasugrel (EFFIENT) tablet 10 mg  10 mg Oral Daily Lennette Bihari, MD      . ranolazine (RANEXA) 12 hr tablet 1,000 mg  1,000 mg Oral BID Chrystie Nose, MD   1,000 mg at 12/29/12 2254  . rosuvastatin (CRESTOR) tablet 20 mg  20 mg Oral q1800 Lauren Bajbus, PHARMD   20 mg at 12/29/12 2246    Physical Exam: General appearance: alert and no distress Neck: no adenopathy, no carotid bruit, supple, symmetrical, trachea midline, thyroid not enlarged, symmetric, no tenderness/mass/nodules and thick neck, JVP cannot  be assessed Lungs: clear to auscultation bilaterally Heart: regular rate and rhythm, S1, S2 normal, no murmur, click, rub or gallop Abdomen: morbidly obese, soft, non-tender Extremities: no edema, groin has mild ecchymosis, no hematoma or bruit Pulses: 2+ and symmetric  Lab Results: Results for orders placed during the hospital encounter of 12/29/12 (from the past 48 hour(s))  GLUCOSE, CAPILLARY     Status: Abnormal   Collection Time    12/29/12  8:29 AM      Result Value Range   Glucose-Capillary 123 (*) 70 - 99 mg/dL  CBC     Status: Abnormal   Collection Time    12/29/12  8:44 AM      Result Value Range   WBC 11.1 (*) 4.0 - 10.5 K/uL   RBC 4.68  4.22 - 5.81 MIL/uL   Hemoglobin 15.1  13.0 - 17.0 g/dL   HCT 86.5  78.4 - 69.6 %   MCV 92.5  78.0 - 100.0 fL   MCH 32.3  26.0 - 34.0 pg   MCHC 34.9  30.0 - 36.0 g/dL   RDW 29.5  28.4 - 13.2 %   Platelets 297  150 - 400 K/uL  PROTIME-INR     Status: None   Collection Time    12/29/12   8:44 AM      Result Value Range   Prothrombin Time 13.1  11.6 - 15.2 seconds   INR 1.00  0.00 - 1.49  COMPREHENSIVE METABOLIC PANEL     Status: Abnormal   Collection Time    12/29/12  9:50 AM      Result Value Range   Sodium 141  135 - 145 mEq/L   Potassium 4.0  3.5 - 5.1 mEq/L   Chloride 109  96 - 112 mEq/L   CO2 23  19 - 32 mEq/L   Glucose, Bld 119 (*) 70 - 99 mg/dL   BUN 13  6 - 23 mg/dL   Creatinine, Ser 4.40  0.50 - 1.35 mg/dL   Calcium 9.3  8.4 - 10.2 mg/dL   Total Protein 6.2  6.0 - 8.3 g/dL   Albumin 3.3 (*) 3.5 - 5.2 g/dL   AST 15  0 - 37 U/L   ALT 22  0 - 53 U/L   Alkaline Phosphatase 57  39 - 117 U/L   Total Bilirubin 0.5  0.3 - 1.2 mg/dL   GFR calc non Af Amer 83 (*) >90 mL/min   GFR calc Af Amer >90  >90 mL/min   Comment:            The eGFR has been calculated     using the CKD EPI equation.     This calculation has not been     validated in all clinical     situations.     eGFR's persistently     <90 mL/min signify     possible Chronic Kidney Disease.  POCT ACTIVATED CLOTTING TIME     Status: None   Collection Time    12/29/12 12:53 PM      Result Value Range   Activated Clotting Time 579    GLUCOSE, CAPILLARY     Status: Abnormal   Collection Time    12/29/12  1:58 PM      Result Value Range   Glucose-Capillary 121 (*) 70 - 99 mg/dL  GLUCOSE, CAPILLARY     Status: Abnormal   Collection Time    12/29/12  9:56 PM  Result Value Range   Glucose-Capillary 373 (*) 70 - 99 mg/dL   Comment 1 Documented in Chart     Comment 2 Notify RN    CBC     Status: Abnormal   Collection Time    12/30/12  6:00 AM      Result Value Range   WBC 26.6 (*) 4.0 - 10.5 K/uL   RBC 4.58  4.22 - 5.81 MIL/uL   Hemoglobin 15.0  13.0 - 17.0 g/dL   HCT 56.2  13.0 - 86.5 %   MCV 93.0  78.0 - 100.0 fL   MCH 32.8  26.0 - 34.0 pg   MCHC 35.2  30.0 - 36.0 g/dL   RDW 78.4  69.6 - 29.5 %   Platelets 331  150 - 400 K/uL  BASIC METABOLIC PANEL     Status: Abnormal   Collection  Time    12/30/12  6:00 AM      Result Value Range   Sodium 142  135 - 145 mEq/L   Potassium 3.6  3.5 - 5.1 mEq/L   Chloride 108  96 - 112 mEq/L   CO2 23  19 - 32 mEq/L   Glucose, Bld 145 (*) 70 - 99 mg/dL   BUN 17  6 - 23 mg/dL   Creatinine, Ser 2.84  0.50 - 1.35 mg/dL   Calcium 9.4  8.4 - 13.2 mg/dL   GFR calc non Af Amer 80 (*) >90 mL/min   GFR calc Af Amer >90  >90 mL/min   Comment:            The eGFR has been calculated     using the CKD EPI equation.     This calculation has not been     validated in all clinical     situations.     eGFR's persistently     <90 mL/min signify     possible Chronic Kidney Disease.  PRO B NATRIURETIC PEPTIDE     Status: Abnormal   Collection Time    12/30/12  6:00 AM      Result Value Range   Pro B Natriuretic peptide (BNP) 245.7 (*) 0 - 125 pg/mL  GLUCOSE, CAPILLARY     Status: Abnormal   Collection Time    12/30/12  8:27 AM      Result Value Range   Glucose-Capillary 134 (*) 70 - 99 mg/dL    Imaging: No results found.  Assessment:  Principal Problem:   Unstable angina Active Problems:   HYPERLIPIDEMIA   Morbid obesity   OBSTRUCTIVE SLEEP APNEA   RESTLESS LEGS SYNDROME   HYPERTENSION   CORONARY ARTERY DISEASE   Abnormal nuclear stress test   Plan:  1. Ronnie [name] is feeling well today. More energetic. Walking without chest pain or dyspnea. Groin site is benign. I feel he can be discharged safely today. Plan follow-up with Dr. Salena Saner on 3/12 (appointment made).  Continue current medications as well as aspirin (325 mg daily for at least 1 month, then 81 mg daily) and effient 10 mg daily for total 1 year.  He would qualify for cardiac rehab again.  Time Spent Directly with Patient:  15 minutes  Length of Stay:  LOS: 1 day   Chrystie Nose, MD, [facility] Attending Cardiologist [facility]  Ronnie [name],Ronnie [name] 12/30/2012, 9:56 AM

## 2013-01-01 MED FILL — Dextrose Inj 5%: INTRAVENOUS | Qty: 50 | Status: AC

## 2013-01-01 NOTE — Discharge Summary (Signed)
Physician Discharge Summary  Patient ID: [name] MRN: [id number] DOB/AGE: Jun 09, 1961 52 y.o.  Admit date: 12/29/2012 Discharge date: 01/01/2013  Admission Diagnoses:  Unstable angina  Discharge Diagnoses:  Principal Problem:   Unstable angina Active Problems:   HYPERLIPIDEMIA   Morbid obesity   OBSTRUCTIVE SLEEP APNEA   RESTLESS LEGS SYNDROME   HYPERTENSION   CORONARY ARTERY DISEASE   Abnormal nuclear stress test   Discharged Condition: stable  Hospital Course:   The patient is a 52 year old gentleman, who was seen in the office on 12/06/12 by Dr. Royann Shivers, with severe obesity, hyperlipidemia, type 2 diabetes mellitus, ongoing tobacco abuse, and extensive history of coronary disease. He asked to be seen urgently in the office because increasing problems with chest pain. He has always had some residual angina but the pattern seems to be accelerating. He has now had to take 1 or 2 sublingual nitroglycerin tablets at least 3-4 times a week over the last couple of weeks. He continues to complain of shortness of breath of varying intensity and this has not changed too much. Sublingual nitroglycerin relieves his chest discomfort. The symptoms have been occurring both at rest and with light activity. He also complains of nausea and indigestion, occasional dizziness and lightheadedness, but has not experienced palpitations or syncope. He does not describe orthopnea, paroxysmal nocturnal dyspnea, or lower extremity edema.  His weight is actually lower today than it was at his last appointment. He has lost 6 pounds since September but it sounds like he had lost even more weight and that recently he might have gained some back. His home scale shows 290 pounds (4 pounds less than our office scale). As mentioned, unfortunately, he continues to smoke roughly half a pack of cigarettes a day and continues to have the same type of fatalistic approach to his health. He is also really not making much in  the way of effort to curtail his food intake and remains morbidly obese with a BMI of about 54.  He was scheduled for an outpatient left heart cath which was completed on 12/29/12( See full cath report).  He ultimately underwent successful complex percutaneous coronary intervention involving the ostium and proximal portion of the left circumflex coronary artery with cutting balloon arthrotomy and ultimate stenting with a 2.25 x 12-mm DES Xience Xpedition stent postdilated to 2.4 mm with the 95% ostial stenosis being reduced to 0%.  The patient refused CPAP the evening after the procedure.  ASA 325mg  and Effient continued.  He reported more energy after the procedure.  He was seen by Dr. Rennis Golden who felt he was stable for DC home. .    Consults: None  Significant Diagnostic Studies:  12/29/12, Left heart cath  DICTATION # [id number], [id number]  AO: 96/63  LV: 96/12  LM: 30 - 40% mid  LAD: 50 prox between dx1 and 2 with "flush and Fill" due to LIMA filling  RI: occluded near ostially  LCX: progressive 95% ostial, patent stent in OM  RCA: occluded prox-mid  SVG to RCA: occluded  SVG to RI: patent  SVG to distal LCX: patent, but does not fill prox LCX territory or OM1  LIMA to LAD: patent  LV: EF 50 - 55% with inferior, inferolateral hypo-akinesis and mild upper posterolateral hypokinesis.  Complex but successful PCI with 2.0x6, 2.5x6 Angiosculpt, 2.25 x 12 DES Xience stent post-dilated with 2.5 x 12 [place]  Angiomax/Efient/NTG  Tolerated well  Lennette Bihari, MD, [facility]  12/29/2012  IMPRESSION:  1.  Normal-to-mild left ventricular dysfunction with mid inferior wall  hypo-to-akinesis and inferolateral hypo-to-akinesis with mild upper  posterolateral hypocontractility with an ejection fraction  approximately 50%.  2. Severe native coronary artery disease with 30-40% eccentric  narrowing in the mid left main coronary artery; 50% proximal left  anterior descending artery stenosis; total occlusion  of the  proximal ramus intermedius vessel; progressive 95% ostial stenosis  in the circumflex coronary artery, proximal to a previously placed  proximal circumflex OM stent and total occlusion of the proximal-to-  mid right coronary artery.  3. Old occlusion of the vein graft, which had supplied the right  coronary artery.  4. Patent left internal mammary artery graft to the left anterior  descending artery.  5. Patent vein graft to the ramus intermedius vessel.  6. Patent vein graft to the distal circumflex vessel.  7. Successful complex percutaneous coronary intervention involving the  ostium and proximal portion of the left circumflex coronary artery  with cutting balloon arthrotomy and ultimate stenting with a 2.25 x  12-mm DES Xience Xpedition stent postdilated to 2.4 mm with the 95%  ostial stenosis being reduced to 0%.  8. Bivalirudin/IC nitroglycerin/the patient is on Effient in addition  to aspirin therapy.  CBC    Component Value Date/Time   WBC 26.6* 12/30/2012 0600   RBC 4.58 12/30/2012 0600   HGB 15.0 12/30/2012 0600   HCT 42.6 12/30/2012 0600   PLT 331 12/30/2012 0600   MCV 93.0 12/30/2012 0600   MCH 32.8 12/30/2012 0600   MCHC 35.2 12/30/2012 0600   RDW 12.7 12/30/2012 0600   LYMPHSABS 2.8 04/16/2011 1428   MONOABS 0.9 04/16/2011 1428   EOSABS 0.6 04/16/2011 1428   BASOSABS 0.1 04/16/2011 1428    BMET    Component Value Date/Time   NA 142 12/30/2012 0600   K 3.6 12/30/2012 0600   CL 108 12/30/2012 0600   CO2 23 12/30/2012 0600   GLUCOSE 145* 12/30/2012 0600   BUN 17 12/30/2012 0600   CREATININE 1.07 12/30/2012 0600   CALCIUM 9.4 12/30/2012 0600   GFRNONAA 80* 12/30/2012 0600   GFRAA >90 12/30/2012 0600     Treatments:   Discharge Exam: Blood pressure 100/50, pulse 83, temperature 96.6 F (35.9 C), temperature source Oral, resp. rate 20, height 5\' 11"  (1.803 m), weight 134.9 kg (297 lb 6.4 oz), SpO2 96.00%.   Disposition: 01-Home or Self Care  Discharge Orders   Future Orders  Complete By Expires     Amb Referral to Cardiac Rehabilitation  As directed     Diet - low sodium heart healthy  As directed     Discharge instructions  As directed     Comments:      No lifting more than a half gallon of milk or driving for three days.    Increase activity slowly  As directed         Medication List    TAKE these medications       aspirin 325 MG EC tablet  Take 1 tablet (325 mg total) by mouth daily.     Fenofibric Acid 135 MG Cpdr  Take 135 mg by mouth at bedtime.     furosemide 40 MG tablet  Commonly known as:  LASIX  Take 80 mg by mouth daily.     isosorbide mononitrate 60 MG 24 hr tablet  Commonly known as:  IMDUR  Take 60 mg by mouth 2 (two) times daily.     lisinopril 5 MG  tablet  Commonly known as:  PRINIVIL,ZESTRIL  Take 5 mg by mouth daily.     metoprolol 50 MG tablet  Commonly known as:  LOPRESSOR  Take 50 mg by mouth 2 (two) times daily.     niacin 1000 MG CR tablet  Commonly known as:  NIASPAN  Take 1,000 mg by mouth at bedtime.     nitroGLYCERIN 0.4 MG/SPRAY spray  Commonly known as:  NITROLINGUAL  Place 1 spray under the tongue every 5 (five) minutes as needed for chest pain.     pantoprazole 40 MG tablet  Commonly known as:  PROTONIX  Take 40 mg by mouth daily.     potassium chloride SA 20 MEQ tablet  Commonly known as:  K-DUR,KLOR-CON  Take 20 mEq by mouth 2 (two) times daily.     prasugrel 10 MG Tabs  Commonly known as:  EFFIENT  Take 10 mg by mouth daily.     RANEXA 1000 MG SR tablet  Generic drug:  ranolazine  Take 1,000 mg by mouth 2 (two) times daily.     rosuvastatin 20 MG tablet  Commonly known as:  CRESTOR  Take 20 mg by mouth daily.     sitaGLIPtin 100 MG tablet  Commonly known as:  JANUVIA  Take 100 mg by mouth daily.           Follow-up Information   Follow up with Thurmon Fair, MD On 01/10/2013.   Contact information:   [address] [place] [address] [phone]        Signed: Wilburt Finlay 01/01/2013, 9:06 AM

## 2013-02-06 ENCOUNTER — Ambulatory Visit (INDEPENDENT_AMBULATORY_CARE_PROVIDER_SITE_OTHER): Payer: BC Managed Care – PPO | Admitting: Urology

## 2013-02-06 DIAGNOSIS — N529 Male erectile dysfunction, unspecified: Secondary | ICD-10-CM

## 2013-02-28 ENCOUNTER — Ambulatory Visit (INDEPENDENT_AMBULATORY_CARE_PROVIDER_SITE_OTHER): Payer: BC Managed Care – PPO | Admitting: Gastroenterology

## 2013-02-28 ENCOUNTER — Encounter: Payer: Self-pay | Admitting: Gastroenterology

## 2013-02-28 VITALS — BP 117/77 | HR 84 | Temp 98.2°F | Ht 71.0 in | Wt 301.2 lb

## 2013-02-28 DIAGNOSIS — K921 Melena: Secondary | ICD-10-CM | POA: Diagnosis not present

## 2013-02-28 MED ORDER — PANTOPRAZOLE SODIUM 40 MG PO TBEC: DELAYED_RELEASE_TABLET | ORAL | Status: DC

## 2013-02-28 MED ORDER — HYDROCORTISONE ACETATE 25 MG RE SUPP: 25.0000 mg | Freq: Two times a day (BID) | RECTAL | Status: DC

## 2013-02-28 NOTE — Patient Instructions (Signed)
CONTINUE A HIGH FIBER DIET  CONTINUE WEIGHT LOSS EFFORTS AND EXERCISE.  DRINK WATER TO KEEP URINE LIGHT YELLOW.  USE ANUSOL TWICE DAILY FOR 12 DAYS. CALL IN 4-6 WEEKS IF SYMPTOMS ARE NOT IMPROVED.  FOLLOW UP IN 4-6 MOS. I WILL CONSIDER HEMORRHOID BANDING AFTER 1 YEAR AND COORDINATE WITH CARDIOLOGY.   Hemorrhoids Hemorrhoids are dilated (enlarged) veins around the rectum. Sometimes clots will form in the veins. This makes them swollen and painful. These are called thrombosed hemorrhoids.  Causes of hemorrhoids include:    Constipation.   Straining to have a bowel movement.      HOME CARE INSTRUCTIONS  Eat a well balanced diet and drink 6 to 8 glasses of water every day to avoid constipation. You may also use a bulk laxative.   Avoid straining to have bowel movements.   Keep anal area dry and clean.   If thrombosed:  Take hot sitz baths for 20 to 30 minutes, 3 to 4 times per day.   If the hemorrhoids are very tender and swollen, place ice packs on area as tolerated. Using ice packs between sitz baths may be helpful. Fill a plastic bag with ice and use a towel between the bag of ice and your skin.   Do not use a donut shaped pillow or sit on the toilet for long periods. This increases blood pooling and pain.   Move your bowels when your body has the urge; this will require less straining and will decrease pain and pressure.

## 2013-02-28 NOTE — Progress Notes (Signed)
Subjective:    Patient ID: [name], male    DOB: 1961-05-30, 52 y.o.   MRN: [id number]  PCP: Phillips Odor  HPI Had heart attaCK/stent last mo. Still sees blood(2-3 times a week). Has leakage of stool. NO RECTAL PAIN OR DISCOMFORT. STILL SMOKING. BREAKS OUT IN A SWEAT. GREEN STOOL. PT DENIES FEVER, CHILLS, nausea, vomiting, melena, diarrhea, constipation, abd pain, problems swallowing, heartburn or indigestion. LOST 5 LBS SINCE MAR 2013.   Past Medical History  Diagnosis Date  . CAD (coronary artery disease) 2008    bypass grafting   . Morbid obesity   . PAF (paroxysmal atrial fibrillation)   . HTN (hypertension)   . COPD (chronic obstructive pulmonary disease)   . Hyperlipidemia   . Sleep apnea   . S/P colonoscopy 2009    3-4 mm transverse colon erosions likely secondary to  ASA  . S/P endoscopy Dec 2011    moderate erosive gastritis, Barrett's esophagus 1-2cm  . Myocardial infarction 2008,2009,2009  . Chronic kidney disease     hx of kidney stones  . GERD (gastroesophageal reflux disease)   . Barrett's esophagus   . Depression   . Glaucoma(365)   . SOB (shortness of breath) 11/03/2007    2D Echo EF 50%-55%  . Abnormal myocardial perfusion study 01/01/2011    there a small to moderate sized inferobasal scar  . OSA (obstructive sleep apnea)     on cpap  . Diabetes     type 2 diabetes mellitus  . Claudication 11/16/2011    PV test perform shows normal   Past Surgical History  Procedure Laterality Date  . Cabg x 4  03/2008  . Hernia repair      ventral hernia repair  . Coronary artery bypass graft  2008    4 vessels  . Cardiac catheterization  2009    stent placement to the left circumflex a 2.25   . Savory dilation  09/06/2011    Procedure: SAVORY DILATION;  Surgeon: Arlyce Harman, MD;  Location: [facility];  Service: Endoscopy;  Laterality: N/A;  Dilated with 15mm  . Esophagogastroduodenoscopy  09/2011    Barrett's esophagus, no dysplasia on biopsies. Distal  esophagitis. Status post dilation. Moderate gastritis and duodenitis, but biopsies benign. Next EGD in November 2015 for surveillance of Barrett's esophagus.  . Cardiac catheterization  07/08/2010  . Coronary stent placement  12/29/12  . Coronary stent placement  12/2012    Allergies  Allergen Reactions  . Iohexol      Desc: PT. STATES HEART STOPPED HAS TO BE PREMED.    Current Outpatient Prescriptions  Medication Sig Dispense Refill  . aspirin EC 325 MG EC tablet Take 1 tablet (325 mg total) by mouth daily.  30 tablet    . Choline Fenofibrate (FENOFIBRIC ACID) 135 MG CPDR Take 135 mg by mouth at bedtime.      . furosemide (LASIX) 40 MG tablet Take 80 mg by mouth daily.      . isosorbide mononitrate (IMDUR) 60 MG 24 hr tablet Take 60 mg by mouth 2 (two) times daily.       Marland Kitchen lisinopril (PRINIVIL,ZESTRIL) 5 MG tablet Take 5 mg by mouth daily.      . metoprolol (LOPRESSOR) 50 MG tablet Take 50 mg by mouth 2 (two) times daily.      . niacin (NIASPAN) 1000 MG CR tablet Take 1,000 mg by mouth at bedtime.       . nitroGLYCERIN (NITROLINGUAL) 0.4 MG/SPRAY spray  Place 1 spray under the tongue every 5 (five) minutes as needed for chest pain.       . pantoprazole (PROTONIX) 40 MG tablet Take 40 mg by mouth daily.      . potassium chloride SA (K-DUR,KLOR-CON) 20 MEQ tablet Take 20 mEq by mouth 2 (two) times daily.       . prasugrel (EFFIENT) 10 MG TABS Take 10 mg by mouth daily.      . ranolazine (RANEXA) 1000 MG SR tablet Take 1,000 mg by mouth 2 (two) times daily.      . rosuvastatin (CRESTOR) 20 MG tablet Take 50 mg by mouth daily.       . sitaGLIPtin (JANUVIA) 100 MG tablet Take 100 mg by mouth daily.           Review of Systems     Objective:   Physical Exam  Vitals reviewed. Constitutional: He is oriented to person, place, and time. No distress.  HENT:  Head: Normocephalic and atraumatic.  Mouth/Throat: Oropharynx is clear and moist. No oropharyngeal exudate.  Eyes: Pupils are  equal, round, and reactive to light. No scleral icterus.  Neck: Normal range of motion. Neck supple.  Cardiovascular: Normal rate, regular rhythm and normal heart sounds.   Pulmonary/Chest: Effort normal and breath sounds normal. No respiratory distress.  Abdominal: Soft. Bowel sounds are normal. He exhibits no distension. There is tenderness.  MILD r PERI-UMBILICAL TTP,OBESE    Musculoskeletal: He exhibits no edema.  Lymphadenopathy:    He has no cervical adenopathy.  Neurological: He is alert and oriented to person, place, and time.  NO FOCAL DEFICITS   Psychiatric: He has a normal mood and affect.          Assessment & Plan:

## 2013-02-28 NOTE — Assessment & Plan Note (Signed)
ASSOCIATED WITH SEEPAGE-MOST LIKELY DUE TO HEMORRHOIDS.  ANUSOL HC BID FOR 12 DAYS. MAY CONSIDER RX 2-3 TIMES A YEAR. PT NOT A CANDIDATE FOR HEMORRHOID BANDING DUE TO RECENT STENT-RISK OF STOPPING EFFIENT OUTWEIGH BENEFITS FROM HEMORRHOID BANDING. DISCUSSED WITH PT AND MOTHER.  ENCOURAGED PT TO STOP SMOKING. OPV IN 4 MOS.

## 2013-02-28 NOTE — Progress Notes (Signed)
Cc PCP 

## 2013-03-02 NOTE — Progress Notes (Signed)
Reminder in epic °

## 2013-03-10 ENCOUNTER — Encounter: Payer: Self-pay | Admitting: Cardiovascular Disease

## 2013-03-14 ENCOUNTER — Encounter: Payer: Self-pay | Admitting: Cardiovascular Disease

## 2013-03-14 ENCOUNTER — Other Ambulatory Visit: Payer: Self-pay | Admitting: *Deleted

## 2013-03-14 ENCOUNTER — Ambulatory Visit (INDEPENDENT_AMBULATORY_CARE_PROVIDER_SITE_OTHER): Payer: BC Managed Care – PPO | Admitting: Cardiovascular Disease

## 2013-03-14 VITALS — BP 118/80 | HR 73 | Resp 24 | Ht 71.0 in | Wt 300.0 lb

## 2013-03-14 DIAGNOSIS — I209 Angina pectoris, unspecified: Secondary | ICD-10-CM

## 2013-03-14 DIAGNOSIS — F17210 Nicotine dependence, cigarettes, uncomplicated: Secondary | ICD-10-CM | POA: Insufficient documentation

## 2013-03-14 DIAGNOSIS — F32A Depression, unspecified: Secondary | ICD-10-CM | POA: Insufficient documentation

## 2013-03-14 DIAGNOSIS — I4891 Unspecified atrial fibrillation: Secondary | ICD-10-CM

## 2013-03-14 DIAGNOSIS — G4733 Obstructive sleep apnea (adult) (pediatric): Secondary | ICD-10-CM

## 2013-03-14 DIAGNOSIS — I25810 Atherosclerosis of coronary artery bypass graft(s) without angina pectoris: Secondary | ICD-10-CM

## 2013-03-14 DIAGNOSIS — F329 Major depressive disorder, single episode, unspecified: Secondary | ICD-10-CM

## 2013-03-14 DIAGNOSIS — E785 Hyperlipidemia, unspecified: Secondary | ICD-10-CM

## 2013-03-14 DIAGNOSIS — F17200 Nicotine dependence, unspecified, uncomplicated: Secondary | ICD-10-CM | POA: Insufficient documentation

## 2013-03-14 DIAGNOSIS — Z720 Tobacco use: Secondary | ICD-10-CM

## 2013-03-14 DIAGNOSIS — I2510 Atherosclerotic heart disease of native coronary artery without angina pectoris: Secondary | ICD-10-CM

## 2013-03-14 MED ORDER — NITROGLYCERIN 0.4 MG/SPRAY TL SOLN: 1.0000 | Status: DC | PRN

## 2013-03-14 MED ORDER — AMLODIPINE BESYLATE 2.5 MG PO TABS: 2.5000 mg | ORAL_TABLET | Freq: Every day | ORAL | Status: DC

## 2013-03-14 NOTE — Assessment & Plan Note (Signed)
On current statin therapy and fibrates his total cholesterol, LDL cholesterol and HDL cholesterol level or at an acceptable level. It was a little sore still time but I doubt that this will improve without weight loss and improved control of his glycemia. Changes are made to his medical therapy.

## 2013-03-14 NOTE — Assessment & Plan Note (Signed)
This is likely related to continued ischemia in the inferior wall. There are no good revascularization options. Both the native vessel and the bypass graft are occluded. He is not a good candidate for redo surgery and it is uncertain whether the distal branches of the right coronary artery would be amenable to redo bypass.  The best option remains medical therapy. He is oriented high doses of long-acting nitrates as well as full doses were no losing. I am reluctant to increase his beta blocker any further since he has wheezing and shortness of breath. I think her shortness of breath is related to his lung problems rather than diastolic dysfunction based on the echocardiographic findings.  His blood pressure may limit further options about like to try low dose of amlodipine 2.5 mg

## 2013-03-14 NOTE — Progress Notes (Signed)
Patient ID: [name], male   DOB: [birth year], 52 y.o.   MRN: [id number]  HPI  Kathlene November returns for followup of coronary disease with angina on exertion CCS class III, coronary artery disease status post coronary bypass surgery and percutaneous revascularization procedures with chronic total occlusion of the right coronary artery as well as occlusion of the saphenous vein graft bypass to the right coronary artery.  He has just undergone an echocardiogram which actually shows preserved left ventricular systolic function. He does not show overt signs of diastolic dysfunction or elevated filling pressures at rest.  He also underwent a Lexiscan Myoview study. He developed severe angina and hypotension during which the skin and fusion. However the imaging study showed effusion abnormalities only in the inferior wall. There was mostly scar with peri-infarct ischemia.  His major limitation continues to be severe angina with minimal exertion. He developed some chest pain when he tries to do anything more than minimal activity. Walking to the mailbox takes 2 or 3 stops to rest. He also developed shortness of breath this has not really changed since his last appointment.   Unfortunately he continues to smoke about the same amount. Starting bupropion has not had any benefit either on his mood which remains depressed or on his smoking habits. He wheezes and coughs on a daily basis.  He has rare dizziness and has not entrance syncope or focal cortical deficits. He continues to have severe rectal dysfunction. He sought the advice of a urologist and the only solution is direct injection which he does not want to consider.  Allergies  Allergen Reactions  . Contrast Media (Iodinated Diagnostic Agents)   . Iohexol      Desc: PT. STATES HEART STOPPED HAS TO BE PREMED.     Current Outpatient Prescriptions  Medication Sig Dispense Refill  . aspirin 325 MG EC tablet Take 81 mg by mouth 2 (two) times daily.       . Choline Fenofibrate (FENOFIBRIC ACID) 135 MG CPDR Take 135 mg by mouth at bedtime.      . furosemide (LASIX) 40 MG tablet Take 80 mg by mouth daily.      . hydrocortisone (ANUSOL-HC) 25 MG suppository Place 1 suppository (25 mg total) rectally every 12 (twelve) hours. For 12 days  24 suppository  0  . isosorbide mononitrate (IMDUR) 60 MG 24 hr tablet Take 60 mg by mouth 2 (two) times daily.       Marland Kitchen lisinopril (PRINIVIL,ZESTRIL) 5 MG tablet Take 5 mg by mouth daily.      . metoprolol (LOPRESSOR) 50 MG tablet Take 50 mg by mouth 2 (two) times daily. 1/2 tablet 2 times a day      . niacin (NIASPAN) 1000 MG CR tablet Take 1,000 mg by mouth at bedtime.       . nitroGLYCERIN (NITROLINGUAL) 0.4 MG/SPRAY spray Place 1 spray under the tongue every 5 (five) minutes as needed for chest pain.       . pantoprazole (PROTONIX) 40 MG tablet 1 PO 30 MINS PRIOR TO BREAKFAST  30 tablet  11  . potassium chloride SA (K-DUR,KLOR-CON) 20 MEQ tablet Take 20 mEq by mouth 2 (two) times daily.       . prasugrel (EFFIENT) 10 MG TABS Take 10 mg by mouth daily.      . ranolazine (RANEXA) 1000 MG SR tablet Take 1,000 mg by mouth 2 (two) times daily.      . rosuvastatin (CRESTOR) 20 MG tablet Take 50  mg by mouth daily.       . sitaGLIPtin (JANUVIA) 100 MG tablet Take 100 mg by mouth daily.       No current facility-administered medications for this visit.    Past Medical History  Diagnosis Date  . CAD (coronary artery disease) 2008    bypass grafting   . Morbid obesity   . PAF (paroxysmal atrial fibrillation)   . HTN (hypertension)   . COPD (chronic obstructive pulmonary disease)   . Hyperlipidemia   . Sleep apnea   . S/P colonoscopy 2009    3-4 mm transverse colon erosions likely secondary to  ASA  . S/P endoscopy Dec 2011    moderate erosive gastritis, Barrett's esophagus 1-2cm  . Myocardial infarction 2008,2009,2009  . Chronic kidney disease     hx of kidney stones  . GERD (gastroesophageal reflux  disease)   . Barrett's esophagus   . Depression   . Glaucoma(365)   . SOB (shortness of breath) 11/03/2007    2D Echo EF 50%-55%  . Abnormal myocardial perfusion study 01/01/2011    there a small to moderate sized inferobasal scar  . OSA (obstructive sleep apnea)     on cpap  . Diabetes     type 2 diabetes mellitus  . Claudication 11/16/2011    PV test perform shows normal    Past Surgical History  Procedure Laterality Date  . Cabg x 4  03/2008  . Hernia repair      ventral hernia repair  . Coronary artery bypass graft  2008    4 vessels  . Cardiac catheterization  2009    stent placement to the left circumflex a 2.25   . Savory dilation  09/06/2011    Procedure: SAVORY DILATION;  Surgeon: Arlyce Harman, MD;  Location: [facility];  Service: Endoscopy;  Laterality: N/A;  Dilated with 15mm  . Esophagogastroduodenoscopy  09/2011    Barrett's esophagus, no dysplasia on biopsies. Distal esophagitis. Status post dilation. Moderate gastritis and duodenitis, but biopsies benign. Next EGD in November 2015 for surveillance of Barrett's esophagus.  . Cardiac catheterization  07/08/2010  . Coronary stent placement  12/29/12  . Coronary stent placement  12/2012    Family History  Problem Relation Age of Onset  . Colon cancer Neg Hx   . Anesthesia problems Neg Hx   . Hypotension Neg Hx   . Malignant hyperthermia Neg Hx   . Pseudochol deficiency Neg Hx     History   Social History  . Marital Status: Married    Spouse Name: N/A    Number of Children: N/A  . Years of Education: N/A   Occupational History  . Not on file.   Social History Main Topics  . Smoking status: Current Every Day Smoker -- 0.50 packs/day for 30 years    Types: Cigarettes  . Smokeless tobacco: Not on file  . Alcohol Use: Yes     Comment: a few beers thru the week  . Drug Use: No  . Sexually Active: Not on file   Other Topics Concern  . Not on file   Social History Narrative  . No narrative on file     ROS: He has angina on exertion, functional class III; dyspnea on exertion [employer] [place] [employer] functional class III; severe erectile dysfunction, no complaints of edema, bleeding, unexpected weight gain, orthopnea, paroxysmal phone dyspnea, palpitations, syncope, focal neurological deficits. He is not active enough to really describe intermittent claudication. He has  daily wheezing and coughing  PHYSICAL EXAM BP 118/80  Pulse 73  Resp 24  Ht 5\' 11"  (1.803 m)  Wt 300 lb (136.079 kg)  BMI 41.86 kg/m2 General appearance: alert, cooperative and no distress Neck: no adenopathy, no carotid bruit, supple, symmetrical, trachea midline, thyroid not enlarged, symmetric, no tenderness/mass/nodules and Jugular venous pulsations are impossible to see due to obesity Lungs: rhonchi bilaterally and wheezes bilaterally Heart: regular rate and rhythm, S1, S2 normal, no murmur, click, rub or gallop Abdomen: soft, non-tender; bowel sounds normal; no masses,  no organomegaly Extremities: extremities normal, atraumatic, no cyanosis or edema Pulses: 2+ and symmetric Skin: Skin color, texture, turgor normal. No rashes or lesions Neurologic: Grossly normal Mood and affect are depressed.  EKG: Normal sinus rhythm without repolarization abnormalities and very small intra-wall Q waves  ASSESSMENT AND PLAN  Angina, class III This is likely related to continued ischemia in the inferior wall. There are no good revascularization options. Both the native vessel and the bypass graft are occluded. He is not a good candidate for redo surgery and it is uncertain whether the distal branches of the right coronary artery would be amenable to redo bypass.  The best option remains medical therapy. He is oriented high doses of long-acting nitrates as well as full doses were no losing. I am reluctant to increase his beta blocker any further since he has wheezing and shortness of breath. I think her shortness of breath is  related to his lung problems rather than diastolic dysfunction based on the echocardiographic findings.  His blood pressure may limit further options about like to try low dose of amlodipine 2.5 mg  OBSTRUCTIVE SLEEP APNEA He is 100% compliant with BiPAP therapy  PAROXYSMAL ATRIAL FIBRILLATION No clinical recurrence in a long time   Orders Placed This Encounter  Procedures  . EKG 12-Lead    Quillan Whitter

## 2013-03-14 NOTE — Assessment & Plan Note (Signed)
No clinical recurrence in a long time

## 2013-03-14 NOTE — Assessment & Plan Note (Signed)
He is 100% compliant with BiPAP therapy

## 2013-03-14 NOTE — Patient Instructions (Signed)
Your physician has recommended you make the following change in your medication: Amlodipine 2.5 mg daily. Call me if you develop worsening dizziness. Your physician recommends that you schedule a follow-up appointment in: 3 months Your physician discussed the hazards of tobacco use. Tobacco use cessation is recommended and techniques and options to help you quit were discussed.

## 2013-03-19 ENCOUNTER — Other Ambulatory Visit: Payer: Self-pay | Admitting: Cardiovascular Disease

## 2013-03-20 NOTE — Telephone Encounter (Signed)
Rx was sent to pharmacy electronically. 

## 2013-05-02 ENCOUNTER — Other Ambulatory Visit: Payer: Self-pay

## 2013-05-02 MED ORDER — FENOFIBRIC ACID 135 MG PO CPDR: 135.0000 mg | DELAYED_RELEASE_CAPSULE | Freq: Every day | ORAL | Status: DC

## 2013-05-02 NOTE — Telephone Encounter (Signed)
Rx was sent to pharmacy electronically. 

## 2013-05-14 ENCOUNTER — Other Ambulatory Visit (HOSPITAL_COMMUNITY): Payer: Self-pay | Admitting: Family Medicine

## 2013-05-14 ENCOUNTER — Ambulatory Visit (HOSPITAL_COMMUNITY)
Admission: RE | Admit: 2013-05-14 | Discharge: 2013-05-14 | Disposition: A | Discharge status: Home / Self Care | Payer: BC Managed Care – PPO | Source: Ambulatory Visit | Attending: Family Medicine | Admitting: Family Medicine

## 2013-05-14 DIAGNOSIS — Z951 Presence of aortocoronary bypass graft: Secondary | ICD-10-CM | POA: Diagnosis not present

## 2013-05-14 DIAGNOSIS — J209 Acute bronchitis, unspecified: Secondary | ICD-10-CM

## 2013-05-14 DIAGNOSIS — Z6841 Body Mass Index (BMI) 40.0 and over, adult: Secondary | ICD-10-CM | POA: Diagnosis not present

## 2013-05-14 DIAGNOSIS — E119 Type 2 diabetes mellitus without complications: Secondary | ICD-10-CM | POA: Diagnosis not present

## 2013-05-14 DIAGNOSIS — I10 Essential (primary) hypertension: Secondary | ICD-10-CM | POA: Diagnosis not present

## 2013-06-19 ENCOUNTER — Other Ambulatory Visit: Payer: Self-pay | Admitting: *Deleted

## 2013-06-19 MED ORDER — METOPROLOL TARTRATE 50 MG PO TABS: 25.0000 mg | ORAL_TABLET | Freq: Two times a day (BID) | ORAL | Status: DC

## 2013-07-05 ENCOUNTER — Ambulatory Visit: Payer: BC Managed Care – PPO | Admitting: Gastroenterology

## 2013-07-05 ENCOUNTER — Telehealth: Payer: Self-pay | Admitting: Gastroenterology

## 2013-07-05 NOTE — Telephone Encounter (Signed)
Pt was a no show

## 2013-07-05 NOTE — Telephone Encounter (Signed)
REVIEWED.  

## 2013-07-17 ENCOUNTER — Other Ambulatory Visit: Payer: Self-pay | Admitting: *Deleted

## 2013-07-17 MED ORDER — RANOLAZINE ER 1000 MG PO TB12: 1000.0000 mg | ORAL_TABLET | Freq: Two times a day (BID) | ORAL | Status: DC

## 2013-07-17 MED ORDER — POTASSIUM CHLORIDE CRYS ER 20 MEQ PO TBCR: 20.0000 meq | EXTENDED_RELEASE_TABLET | Freq: Two times a day (BID) | ORAL | Status: DC

## 2013-07-17 NOTE — Telephone Encounter (Signed)
Rx was sent to pharmacy electronically. 

## 2013-08-15 ENCOUNTER — Telehealth: Payer: Self-pay | Admitting: General Practice

## 2013-08-15 NOTE — Telephone Encounter (Signed)
I spoke with Ginger and she needs to reschedule the pts appt with [facility] on 10/21, not a valid appt time.

## 2013-08-16 NOTE — Telephone Encounter (Signed)
LMOM about new appointment

## 2013-08-21 ENCOUNTER — Ambulatory Visit: Payer: BC Managed Care – PPO | Admitting: Gastroenterology

## 2013-08-30 ENCOUNTER — Encounter (INDEPENDENT_AMBULATORY_CARE_PROVIDER_SITE_OTHER): Payer: Self-pay

## 2013-08-30 ENCOUNTER — Encounter: Payer: Self-pay | Admitting: Gastroenterology

## 2013-08-30 ENCOUNTER — Ambulatory Visit (INDEPENDENT_AMBULATORY_CARE_PROVIDER_SITE_OTHER): Payer: BC Managed Care – PPO | Admitting: Gastroenterology

## 2013-08-30 ENCOUNTER — Other Ambulatory Visit: Payer: Self-pay | Admitting: Gastroenterology

## 2013-08-30 VITALS — BP 118/78 | HR 83 | Temp 98.4°F | Wt 311.6 lb

## 2013-08-30 DIAGNOSIS — K921 Melena: Secondary | ICD-10-CM | POA: Diagnosis not present

## 2013-08-30 DIAGNOSIS — R1319 Other dysphagia: Secondary | ICD-10-CM

## 2013-08-30 DIAGNOSIS — K219 Gastro-esophageal reflux disease without esophagitis: Secondary | ICD-10-CM | POA: Diagnosis not present

## 2013-08-30 NOTE — Assessment & Plan Note (Addendum)
Trial of Dexilant. Concern for possible underlying motility disorder with symptoms of choking, vague dysphagia. Proceed with BPE. Surveillance for Barrett's due Nov 2015. With cardiac history, would want to hold off on any invasive procedure right now.

## 2013-08-30 NOTE — Progress Notes (Signed)
Referring Provider: Corrie Mckusick, MD Primary Care Physician:  Colette Ribas, MD Primary GI: Dr. Darrick Penna   Chief Complaint  Patient presents with  . Follow-up    HPI:   [name] returns in follow-up with history of GERD, Barrett's esophagus, dysphagia, and rectal bleeding due to internal hemorrhoids. Due for Barrett's surveillance in Nov 2015. Notes choking for no reason. Several times feels like his windpipe is shutting off. Not associated with eating. Starts coughing, then shuts off his air. If nibbling something may have some choking, dysphagia. Hard to swallow, gets sore. Can be associated with liquids. Protonix once daily. Denies exacerbation of reflux. Still smoking. Blood every other day to every 2 days. Paper hematochezia. No rectal pain or discomfort.   Past Medical History  Diagnosis Date  . CAD (coronary artery disease) 2008    bypass grafting   . Morbid obesity   . PAF (paroxysmal atrial fibrillation)   . HTN (hypertension)   . COPD (chronic obstructive pulmonary disease)   . Hyperlipidemia   . Sleep apnea   . S/P colonoscopy 2009    3-4 mm transverse colon erosions likely secondary to  ASA  . S/P endoscopy Dec 2011    moderate erosive gastritis, Barrett's esophagus 1-2cm  . Myocardial infarction 2008,2009,2009  . Chronic kidney disease     hx of kidney stones  . GERD (gastroesophageal reflux disease)   . Barrett's esophagus   . Depression   . Glaucoma   . SOB (shortness of breath) 11/03/2007    2D Echo EF 50%-55%  . Abnormal myocardial perfusion study 01/01/2011    there a small to moderate sized inferobasal scar  . OSA (obstructive sleep apnea)     on cpap  . Diabetes     type 2 diabetes mellitus  . Claudication 11/16/2011    PV test perform shows normal    Past Surgical History  Procedure Laterality Date  . Cabg x 4  03/2008  . Hernia repair      ventral hernia repair  . Coronary artery bypass graft  2008    4 vessels  . Cardiac  catheterization  2009    stent placement to the left circumflex a 2.25   . Savory dilation  09/06/2011    Procedure: SAVORY DILATION;  Surgeon: Arlyce Harman, MD;  Location: [facility];  Service: Endoscopy;  Laterality: N/A;  Dilated with 15mm  . Esophagogastroduodenoscopy  09/2011    Barrett's esophagus, no dysplasia on biopsies. Distal esophagitis. Status post dilation. Moderate gastritis and duodenitis, but biopsies benign. Next EGD in November 2015 for surveillance of Barrett's esophagus.  . Cardiac catheterization  07/08/2010  . Coronary stent placement  12/29/12  . Coronary stent placement  12/2012    Current Outpatient Prescriptions  Medication Sig Dispense Refill  . amLODipine (NORVASC) 2.5 MG tablet Take 1 tablet (2.5 mg total) by mouth daily.  180 tablet  3  . aspirin 81 MG tablet Take 81 mg by mouth daily.      . Choline Fenofibrate (FENOFIBRIC ACID) 135 MG CPDR Take 135 mg by mouth at bedtime.  30 capsule  8  . CRESTOR 20 MG tablet TAKE ONE TABLET BY MOUTH EVERY DAY  30 tablet  11  . furosemide (LASIX) 40 MG tablet Take 80 mg by mouth daily.      . isosorbide mononitrate (IMDUR) 60 MG 24 hr tablet Take 60 mg by mouth 2 (two) times daily.       Marland Kitchen  lisinopril (PRINIVIL,ZESTRIL) 5 MG tablet Take 5 mg by mouth daily.      . metoprolol (LOPRESSOR) 50 MG tablet Take 0.5 tablets (25 mg total) by mouth 2 (two) times daily.  90 tablet  3  . nitroGLYCERIN (NITROLINGUAL) 0.4 MG/SPRAY spray Place 1 spray under the tongue every 5 (five) minutes as needed for chest pain.  12 g  3  . pantoprazole (PROTONIX) 40 MG tablet 1 PO 30 MINS PRIOR TO BREAKFAST  30 tablet  11  . potassium chloride SA (K-DUR,KLOR-CON) 20 MEQ tablet Take 1 tablet (20 mEq total) by mouth 2 (two) times daily.  60 tablet  10  . prasugrel (EFFIENT) 10 MG TABS Take 10 mg by mouth daily.      . ranolazine (RANEXA) 1000 MG SR tablet Take 1 tablet (1,000 mg total) by mouth 2 (two) times daily.  60 tablet  10  . sitaGLIPtin (JANUVIA) 100  MG tablet Take 100 mg by mouth daily.      . hydrocortisone (ANUSOL-HC) 25 MG suppository Place 1 suppository (25 mg total) rectally every 12 (twelve) hours. For 12 days  24 suppository  0  . niacin (NIASPAN) 1000 MG CR tablet Take 1,000 mg by mouth at bedtime.        No current facility-administered medications for this visit.    Allergies as of 08/30/2013 - Review Complete 08/30/2013  Allergen Reaction Noted  . Contrast media [iodinated diagnostic agents]  03/14/2013  . Iohexol  01/02/2008    Family History  Problem Relation Age of Onset  . Colon cancer Neg Hx   . Anesthesia problems Neg Hx   . Hypotension Neg Hx   . Malignant hyperthermia Neg Hx   . Pseudochol deficiency Neg Hx     History   Social History  . Marital Status: Married    Spouse Name: N/A    Number of Children: N/A  . Years of Education: N/A   Social History Main Topics  . Smoking status: Current Every Day Smoker -- 0.50 packs/day for 30 years    Types: Cigarettes  . Smokeless tobacco: None  . Alcohol Use: Yes     Comment: a few beers thru the week  . Drug Use: No  . Sexual Activity: None   Other Topics Concern  . None   Social History Narrative  . None    Review of Systems: As mentioned in HPI  Physical Exam: BP 118/78  Pulse 83  Temp(Src) 98.4 F (36.9 C) (Oral)  Wt 311 lb 9.6 oz (141.341 kg)  BMI 43.48 kg/m2 General:   Alert and oriented. No distress noted. Pleasant and cooperative.  Head:  Normocephalic and atraumatic. Eyes:  Conjuctiva clear without scleral icterus. Heart:  S1, S2 present  Abdomen:  +BS, soft, TTP upper abdomen at sight of likely small ventral hernia.  Largely obese, large AP diameter. Difficult to appreciate HSM due to body habitus.  Msk:  Symmetrical without gross deformities. Normal posture. Neurologic:  Alert and  oriented x4;  grossly normal neurologically. Skin:  Intact without significant lesions or rashes. Psych:  Alert and cooperative. Normal mood and  affect.

## 2013-09-03 NOTE — Assessment & Plan Note (Signed)
Continue Anusol prn. Appears scant. Not candidate for banding currently. Return in 3-4 months.

## 2013-09-03 NOTE — Patient Instructions (Signed)
Note: AVS was unavailable due to internet connectivity. Instructions provided in word document to patient.

## 2013-09-06 ENCOUNTER — Ambulatory Visit (HOSPITAL_COMMUNITY)
Admission: RE | Admit: 2013-09-06 | Discharge: 2013-09-06 | Disposition: A | Discharge status: Home / Self Care | Payer: BC Managed Care – PPO | Source: Ambulatory Visit | Attending: Gastroenterology | Admitting: Gastroenterology

## 2013-09-06 DIAGNOSIS — R1319 Other dysphagia: Secondary | ICD-10-CM | POA: Insufficient documentation

## 2013-09-06 DIAGNOSIS — R1310 Dysphagia, unspecified: Secondary | ICD-10-CM | POA: Diagnosis not present

## 2013-09-10 NOTE — Progress Notes (Signed)
cc'd to pcp 

## 2013-09-12 NOTE — Progress Notes (Signed)
Quick Note:  Called and informed pt. He said he has not seen any difference with the Dexilant. Please advise! ______

## 2013-09-14 DIAGNOSIS — Z719 Counseling, unspecified: Secondary | ICD-10-CM | POA: Diagnosis not present

## 2013-09-14 DIAGNOSIS — E119 Type 2 diabetes mellitus without complications: Secondary | ICD-10-CM | POA: Diagnosis not present

## 2013-09-14 DIAGNOSIS — Z6841 Body Mass Index (BMI) 40.0 and over, adult: Secondary | ICD-10-CM | POA: Diagnosis not present

## 2013-09-14 DIAGNOSIS — I10 Essential (primary) hypertension: Secondary | ICD-10-CM | POA: Diagnosis not present

## 2013-09-17 NOTE — Progress Notes (Signed)
Quick Note:  Esophageal motility was normal.  I'm sorry. I wrote a result note but apparently did not "attach" it to the BPE. This is what I wrote: "No obstruction of barium tablet.  No aspiration appreciated. Would recommend ENT evaluation to be thorough. Continue Dexilant Follow-up with Dr. Darrick Penna in 4-6 weeks" ______

## 2013-09-17 NOTE — Progress Notes (Signed)
Quick Note:  [name], I sent a message that pt had not seen in difference with the Dexilant. Please advise before I call pt. Thanks! ______

## 2013-09-17 NOTE — Progress Notes (Signed)
Quick Note:  Esophageal motility was normal.  No obstruction of barium tablet.  No aspiration appreciated. Would recommend ENT evaluation to be thorough. Continue Dexilant Follow-up with Dr. Darrick Penna in 4-6 weeks ______

## 2013-09-18 ENCOUNTER — Other Ambulatory Visit: Payer: Self-pay | Admitting: Gastroenterology

## 2013-09-18 DIAGNOSIS — R1319 Other dysphagia: Secondary | ICD-10-CM

## 2013-09-18 NOTE — Progress Notes (Signed)
Quick Note:  Called and informed pt. He said ok to refer for ENT evaluation. He has started back on the Protonix and per [facility] told pt he can take it bid and let us know when he needs new Rx sent in. ______

## 2013-10-18 ENCOUNTER — Encounter (INDEPENDENT_AMBULATORY_CARE_PROVIDER_SITE_OTHER): Payer: Self-pay

## 2013-10-18 ENCOUNTER — Ambulatory Visit (INDEPENDENT_AMBULATORY_CARE_PROVIDER_SITE_OTHER): Payer: BC Managed Care – PPO | Admitting: Otolaryngology

## 2013-10-18 DIAGNOSIS — R1312 Dysphagia, oropharyngeal phase: Secondary | ICD-10-CM

## 2013-10-18 DIAGNOSIS — K219 Gastro-esophageal reflux disease without esophagitis: Secondary | ICD-10-CM | POA: Diagnosis not present

## 2013-11-22 ENCOUNTER — Ambulatory Visit (INDEPENDENT_AMBULATORY_CARE_PROVIDER_SITE_OTHER): Payer: BC Managed Care – PPO | Admitting: Otolaryngology

## 2013-11-22 DIAGNOSIS — K219 Gastro-esophageal reflux disease without esophagitis: Secondary | ICD-10-CM

## 2013-11-22 DIAGNOSIS — R490 Dysphonia: Secondary | ICD-10-CM | POA: Diagnosis not present

## 2014-01-01 DIAGNOSIS — I10 Essential (primary) hypertension: Secondary | ICD-10-CM | POA: Diagnosis not present

## 2014-01-01 DIAGNOSIS — E119 Type 2 diabetes mellitus without complications: Secondary | ICD-10-CM | POA: Diagnosis not present

## 2014-01-01 DIAGNOSIS — Z6841 Body Mass Index (BMI) 40.0 and over, adult: Secondary | ICD-10-CM | POA: Diagnosis not present

## 2014-01-01 DIAGNOSIS — E785 Hyperlipidemia, unspecified: Secondary | ICD-10-CM | POA: Diagnosis not present

## 2014-01-11 DIAGNOSIS — R0789 Other chest pain: Secondary | ICD-10-CM | POA: Diagnosis not present

## 2014-01-11 DIAGNOSIS — Z951 Presence of aortocoronary bypass graft: Secondary | ICD-10-CM | POA: Insufficient documentation

## 2014-01-11 DIAGNOSIS — E119 Type 2 diabetes mellitus without complications: Secondary | ICD-10-CM | POA: Diagnosis not present

## 2014-01-11 DIAGNOSIS — R079 Chest pain, unspecified: Secondary | ICD-10-CM | POA: Diagnosis not present

## 2014-01-11 DIAGNOSIS — Z955 Presence of coronary angioplasty implant and graft: Secondary | ICD-10-CM | POA: Insufficient documentation

## 2014-01-11 DIAGNOSIS — R0602 Shortness of breath: Secondary | ICD-10-CM | POA: Diagnosis not present

## 2014-01-15 ENCOUNTER — Other Ambulatory Visit: Payer: Self-pay | Admitting: Cardiovascular Disease

## 2014-01-16 NOTE — Telephone Encounter (Signed)
Rx was sent to pharmacy electronically. 

## 2014-02-11 ENCOUNTER — Other Ambulatory Visit: Payer: Self-pay | Admitting: Cardiovascular Disease

## 2014-02-11 ENCOUNTER — Other Ambulatory Visit: Payer: Self-pay | Admitting: Internal Medicine

## 2014-02-11 NOTE — Telephone Encounter (Signed)
Rx was sent to pharmacy electronically. 

## 2014-02-14 ENCOUNTER — Ambulatory Visit (INDEPENDENT_AMBULATORY_CARE_PROVIDER_SITE_OTHER): Payer: BC Managed Care – PPO | Admitting: Otolaryngology

## 2014-02-14 DIAGNOSIS — R1312 Dysphagia, oropharyngeal phase: Secondary | ICD-10-CM

## 2014-02-26 DIAGNOSIS — H40009 Preglaucoma, unspecified, unspecified eye: Secondary | ICD-10-CM | POA: Diagnosis not present

## 2014-02-26 DIAGNOSIS — H2510 Age-related nuclear cataract, unspecified eye: Secondary | ICD-10-CM | POA: Diagnosis not present

## 2014-02-26 DIAGNOSIS — H538 Other visual disturbances: Secondary | ICD-10-CM | POA: Diagnosis not present

## 2014-02-26 DIAGNOSIS — E119 Type 2 diabetes mellitus without complications: Secondary | ICD-10-CM | POA: Diagnosis not present

## 2014-03-04 DIAGNOSIS — H40009 Preglaucoma, unspecified, unspecified eye: Secondary | ICD-10-CM | POA: Diagnosis not present

## 2014-03-15 ENCOUNTER — Observation Stay (HOSPITAL_COMMUNITY)
Admission: EM | Admit: 2014-03-15 | Discharge: 2014-03-16 | Disposition: A | Discharge status: Home / Self Care | Payer: BC Managed Care – PPO | Attending: Cardiovascular Disease | Admitting: Cardiovascular Disease

## 2014-03-15 ENCOUNTER — Emergency Department (HOSPITAL_COMMUNITY): Payer: BC Managed Care – PPO

## 2014-03-15 ENCOUNTER — Encounter (HOSPITAL_COMMUNITY): Payer: Self-pay | Admitting: Emergency Medicine

## 2014-03-15 ENCOUNTER — Observation Stay (HOSPITAL_COMMUNITY): Payer: BC Managed Care – PPO

## 2014-03-15 DIAGNOSIS — I739 Peripheral vascular disease, unspecified: Secondary | ICD-10-CM | POA: Diagnosis not present

## 2014-03-15 DIAGNOSIS — R911 Solitary pulmonary nodule: Secondary | ICD-10-CM

## 2014-03-15 DIAGNOSIS — I252 Old myocardial infarction: Secondary | ICD-10-CM | POA: Diagnosis not present

## 2014-03-15 DIAGNOSIS — S20219A Contusion of unspecified front wall of thorax, initial encounter: Secondary | ICD-10-CM | POA: Diagnosis not present

## 2014-03-15 DIAGNOSIS — G4733 Obstructive sleep apnea (adult) (pediatric): Secondary | ICD-10-CM | POA: Diagnosis not present

## 2014-03-15 DIAGNOSIS — J449 Chronic obstructive pulmonary disease, unspecified: Secondary | ICD-10-CM | POA: Diagnosis not present

## 2014-03-15 DIAGNOSIS — I10 Essential (primary) hypertension: Secondary | ICD-10-CM | POA: Diagnosis present

## 2014-03-15 DIAGNOSIS — Z7982 Long term (current) use of aspirin: Secondary | ICD-10-CM | POA: Insufficient documentation

## 2014-03-15 DIAGNOSIS — I4891 Unspecified atrial fibrillation: Secondary | ICD-10-CM | POA: Insufficient documentation

## 2014-03-15 DIAGNOSIS — E785 Hyperlipidemia, unspecified: Secondary | ICD-10-CM | POA: Diagnosis not present

## 2014-03-15 DIAGNOSIS — Z79899 Other long term (current) drug therapy: Secondary | ICD-10-CM | POA: Diagnosis not present

## 2014-03-15 DIAGNOSIS — N189 Chronic kidney disease, unspecified: Secondary | ICD-10-CM | POA: Diagnosis not present

## 2014-03-15 DIAGNOSIS — E119 Type 2 diabetes mellitus without complications: Secondary | ICD-10-CM | POA: Diagnosis not present

## 2014-03-15 DIAGNOSIS — I2510 Atherosclerotic heart disease of native coronary artery without angina pectoris: Secondary | ICD-10-CM | POA: Diagnosis not present

## 2014-03-15 DIAGNOSIS — G4730 Sleep apnea, unspecified: Secondary | ICD-10-CM | POA: Insufficient documentation

## 2014-03-15 DIAGNOSIS — F329 Major depressive disorder, single episode, unspecified: Secondary | ICD-10-CM | POA: Diagnosis not present

## 2014-03-15 DIAGNOSIS — H40009 Preglaucoma, unspecified, unspecified eye: Secondary | ICD-10-CM | POA: Insufficient documentation

## 2014-03-15 DIAGNOSIS — F17200 Nicotine dependence, unspecified, uncomplicated: Secondary | ICD-10-CM | POA: Insufficient documentation

## 2014-03-15 DIAGNOSIS — K439 Ventral hernia without obstruction or gangrene: Secondary | ICD-10-CM | POA: Diagnosis not present

## 2014-03-15 DIAGNOSIS — R296 Repeated falls: Secondary | ICD-10-CM | POA: Diagnosis not present

## 2014-03-15 DIAGNOSIS — J4489 Other specified chronic obstructive pulmonary disease: Secondary | ICD-10-CM | POA: Diagnosis not present

## 2014-03-15 DIAGNOSIS — Y929 Unspecified place or not applicable: Secondary | ICD-10-CM | POA: Diagnosis not present

## 2014-03-15 DIAGNOSIS — H269 Unspecified cataract: Secondary | ICD-10-CM | POA: Insufficient documentation

## 2014-03-15 DIAGNOSIS — E782 Mixed hyperlipidemia: Secondary | ICD-10-CM | POA: Diagnosis present

## 2014-03-15 DIAGNOSIS — R109 Unspecified abdominal pain: Secondary | ICD-10-CM | POA: Insufficient documentation

## 2014-03-15 DIAGNOSIS — R0602 Shortness of breath: Secondary | ICD-10-CM | POA: Diagnosis present

## 2014-03-15 DIAGNOSIS — Z87442 Personal history of urinary calculi: Secondary | ICD-10-CM | POA: Diagnosis not present

## 2014-03-15 DIAGNOSIS — I209 Angina pectoris, unspecified: Secondary | ICD-10-CM | POA: Diagnosis not present

## 2014-03-15 DIAGNOSIS — F3289 Other specified depressive episodes: Secondary | ICD-10-CM | POA: Diagnosis not present

## 2014-03-15 DIAGNOSIS — R079 Chest pain, unspecified: Secondary | ICD-10-CM | POA: Diagnosis not present

## 2014-03-15 DIAGNOSIS — K7689 Other specified diseases of liver: Secondary | ICD-10-CM | POA: Diagnosis not present

## 2014-03-15 DIAGNOSIS — Z9889 Other specified postprocedural states: Secondary | ICD-10-CM | POA: Insufficient documentation

## 2014-03-15 DIAGNOSIS — J438 Other emphysema: Secondary | ICD-10-CM | POA: Diagnosis not present

## 2014-03-15 DIAGNOSIS — I499 Cardiac arrhythmia, unspecified: Secondary | ICD-10-CM | POA: Insufficient documentation

## 2014-03-15 DIAGNOSIS — Y93H2 Activity, gardening and landscaping: Secondary | ICD-10-CM | POA: Insufficient documentation

## 2014-03-15 DIAGNOSIS — I480 Paroxysmal atrial fibrillation: Secondary | ICD-10-CM | POA: Diagnosis present

## 2014-03-15 DIAGNOSIS — K219 Gastro-esophageal reflux disease without esophagitis: Secondary | ICD-10-CM | POA: Insufficient documentation

## 2014-03-15 DIAGNOSIS — R9439 Abnormal result of other cardiovascular function study: Secondary | ICD-10-CM | POA: Insufficient documentation

## 2014-03-15 DIAGNOSIS — J939 Pneumothorax, unspecified: Secondary | ICD-10-CM

## 2014-03-15 DIAGNOSIS — C3490 Malignant neoplasm of unspecified part of unspecified bronchus or lung: Secondary | ICD-10-CM

## 2014-03-15 DIAGNOSIS — J9819 Other pulmonary collapse: Secondary | ICD-10-CM | POA: Diagnosis not present

## 2014-03-15 DIAGNOSIS — Z85118 Personal history of other malignant neoplasm of bronchus and lung: Secondary | ICD-10-CM

## 2014-03-15 HISTORY — DX: Ventral hernia without obstruction or gangrene: K43.9

## 2014-03-15 HISTORY — DX: Unspecified cataract: H26.9

## 2014-03-15 HISTORY — DX: Cardiac arrhythmia, unspecified: I49.9

## 2014-03-15 LAB — COMPREHENSIVE METABOLIC PANEL
ALT: 54 U/L — ABNORMAL HIGH (ref 0–53)
AST: 32 U/L (ref 0–37)
Albumin: 3.6 g/dL (ref 3.5–5.2)
Alkaline Phosphatase: 78 U/L (ref 39–117)
BILIRUBIN TOTAL: 0.4 mg/dL (ref 0.3–1.2)
BUN: 9 mg/dL (ref 6–23)
CHLORIDE: 100 meq/L (ref 96–112)
CO2: 23 mEq/L (ref 19–32)
CREATININE: 1.13 mg/dL (ref 0.50–1.35)
Calcium: 9.3 mg/dL (ref 8.4–10.5)
GFR calc Af Amer: 85 mL/min — ABNORMAL LOW (ref >90)
GFR, EST NON AFRICAN AMERICAN: 74 mL/min — AB (ref >90)
Glucose, Bld: 120 mg/dL — ABNORMAL HIGH (ref 70–99)
Potassium: 3.7 mEq/L (ref 3.7–5.3)
Sodium: 138 mEq/L (ref 137–147)
Total Protein: 7 g/dL (ref 6.0–8.3)

## 2014-03-15 LAB — CBC WITH DIFFERENTIAL/PLATELET
BASOS ABS: 0.1 10*3/uL (ref 0.0–0.1)
Basophils Relative: 1 % (ref 0–1)
Eosinophils Absolute: 0.5 10*3/uL (ref 0.0–0.7)
Eosinophils Relative: 5 % (ref 0–5)
HEMATOCRIT: 45.6 % (ref 39.0–52.0)
HEMOGLOBIN: 16.2 g/dL (ref 13.0–17.0)
LYMPHS ABS: 3.2 10*3/uL (ref 0.7–4.0)
LYMPHS PCT: 29 % (ref 12–46)
MCH: 33.1 pg (ref 26.0–34.0)
MCHC: 35.5 g/dL (ref 30.0–36.0)
MCV: 93.3 fL (ref 78.0–100.0)
MONO ABS: 0.7 10*3/uL (ref 0.1–1.0)
MONOS PCT: 7 % (ref 3–12)
NEUTROS ABS: 6.6 10*3/uL (ref 1.7–7.7)
Neutrophils Relative %: 58 % (ref 43–77)
Platelets: 272 10*3/uL (ref 150–400)
RBC: 4.89 MIL/uL (ref 4.22–5.81)
RDW: 12.6 % (ref 11.5–15.5)
WBC: 11.1 10*3/uL — AB (ref 4.0–10.5)

## 2014-03-15 LAB — PROTIME-INR
INR: 1.06 (ref 0.00–1.49)
Prothrombin Time: 13.6 seconds (ref 11.6–15.2)

## 2014-03-15 LAB — TROPONIN I: Troponin I: <0.3 ng/mL (ref <0.30)

## 2014-03-15 LAB — GLUCOSE, CAPILLARY: Glucose-Capillary: 174 mg/dL — ABNORMAL HIGH (ref 70–99)

## 2014-03-15 LAB — PRO B NATRIURETIC PEPTIDE: Pro B Natriuretic peptide (BNP): 27.1 pg/mL (ref 0–125)

## 2014-03-15 MED ORDER — PRASUGREL HCL 10 MG PO TABS
10.0000 mg | ORAL_TABLET | Freq: Every morning | ORAL | Status: DC
  Administered 2014-03-16: 10 mg via ORAL
  Filled 2014-03-15: qty 1

## 2014-03-15 MED ORDER — METOPROLOL TARTRATE 100 MG PO TABS
100.0000 mg | ORAL_TABLET | Freq: Two times a day (BID) | ORAL | Status: DC
  Administered 2014-03-16 (×2): 100 mg via ORAL
  Filled 2014-03-15 (×3): qty 1

## 2014-03-15 MED ORDER — PANTOPRAZOLE SODIUM 40 MG PO TBEC
40.0000 mg | DELAYED_RELEASE_TABLET | Freq: Every morning | ORAL | Status: DC
  Administered 2014-03-16: 40 mg via ORAL
  Filled 2014-03-15: qty 1

## 2014-03-15 MED ORDER — RANOLAZINE ER 500 MG PO TB12
1000.0000 mg | ORAL_TABLET | Freq: Two times a day (BID) | ORAL | Status: DC
  Administered 2014-03-16 (×2): 1000 mg via ORAL
  Filled 2014-03-15 (×3): qty 2

## 2014-03-15 MED ORDER — ASPIRIN EC 81 MG PO TBEC
81.0000 mg | DELAYED_RELEASE_TABLET | Freq: Two times a day (BID) | ORAL | Status: DC
  Administered 2014-03-16 (×2): 81 mg via ORAL
  Filled 2014-03-15 (×4): qty 1

## 2014-03-15 MED ORDER — ASPIRIN 81 MG PO CHEW
324.0000 mg | CHEWABLE_TABLET | Freq: Once | ORAL | Status: AC
  Administered 2014-03-15: 324 mg via ORAL
  Filled 2014-03-15: qty 4

## 2014-03-15 MED ORDER — ASPIRIN EC 81 MG PO TBEC: 81.0000 mg | DELAYED_RELEASE_TABLET | Freq: Every day | ORAL | Status: DC

## 2014-03-15 MED ORDER — ENOXAPARIN SODIUM 40 MG/0.4ML ~~LOC~~ SOLN
40.0000 mg | Freq: Every day | SUBCUTANEOUS | Status: DC
  Administered 2014-03-16: 40 mg via SUBCUTANEOUS
  Filled 2014-03-15: qty 0.4

## 2014-03-15 MED ORDER — MORPHINE SULFATE 4 MG/ML IJ SOLN
4.0000 mg | Freq: Once | INTRAMUSCULAR | Status: AC
  Administered 2014-03-15: 4 mg via INTRAVENOUS
  Filled 2014-03-15: qty 1

## 2014-03-15 MED ORDER — ATORVASTATIN CALCIUM 40 MG PO TABS
40.0000 mg | ORAL_TABLET | Freq: Every day | ORAL | Status: DC
  Filled 2014-03-15: qty 1

## 2014-03-15 MED ORDER — OXYCODONE-ACETAMINOPHEN 5-325 MG PO TABS: 1.0000 | ORAL_TABLET | Freq: Four times a day (QID) | ORAL | Status: DC | PRN

## 2014-03-15 MED ORDER — LISINOPRIL 5 MG PO TABS
5.0000 mg | ORAL_TABLET | Freq: Every morning | ORAL | Status: DC
  Administered 2014-03-16: 5 mg via ORAL
  Filled 2014-03-15: qty 1

## 2014-03-15 MED ORDER — INSULIN ASPART 100 UNIT/ML ~~LOC~~ SOLN: 0.0000 [IU] | Freq: Three times a day (TID) | SUBCUTANEOUS | Status: DC

## 2014-03-15 MED ORDER — DIAZEPAM 5 MG PO TABS
5.0000 mg | ORAL_TABLET | Freq: Two times a day (BID) | ORAL | Status: DC
  Administered 2014-03-16 (×2): 5 mg via ORAL
  Filled 2014-03-15 (×2): qty 1

## 2014-03-15 MED ORDER — ISOSORBIDE MONONITRATE ER 60 MG PO TB24
60.0000 mg | ORAL_TABLET | Freq: Three times a day (TID) | ORAL | Status: DC
  Administered 2014-03-16 (×2): 60 mg via ORAL
  Filled 2014-03-15 (×5): qty 1

## 2014-03-15 MED ORDER — NITROGLYCERIN 0.4 MG SL SUBL: 0.4000 mg | SUBLINGUAL_TABLET | SUBLINGUAL | Status: DC | PRN

## 2014-03-15 MED ORDER — FUROSEMIDE 80 MG PO TABS
80.0000 mg | ORAL_TABLET | Freq: Every morning | ORAL | Status: DC
  Administered 2014-03-16: 80 mg via ORAL
  Filled 2014-03-15: qty 1

## 2014-03-15 MED ORDER — ONDANSETRON HCL 4 MG/2ML IJ SOLN
4.0000 mg | Freq: Once | INTRAMUSCULAR | Status: AC
  Administered 2014-03-15: 4 mg via INTRAVENOUS
  Filled 2014-03-15: qty 2

## 2014-03-15 MED ORDER — POTASSIUM CHLORIDE CRYS ER 20 MEQ PO TBCR
20.0000 meq | EXTENDED_RELEASE_TABLET | Freq: Two times a day (BID) | ORAL | Status: DC
  Administered 2014-03-16 (×2): 20 meq via ORAL
  Filled 2014-03-15 (×4): qty 1

## 2014-03-15 NOTE — ED Notes (Signed)
Sob with pain to right side since yesterday.  Reports was picking up tomato vines yesterday and starting have cp to left side, took Nitro spray x 2 with relief.  Now reports pain to right side with deep breath.

## 2014-03-15 NOTE — ED Provider Notes (Addendum)
CSN: [id number]     Arrival date & time 03/15/14  1750 History   First MD Initiated Contact with Patient 03/15/14 1803    This chart was scribed for Burnell Blanks, * by Terressa Koyanagi, ED Scribe. This patient was seen in room APA03/APA03 and the patient's care was started at 6:07 PM.  Chief Complaint  Patient presents with  . Shortness of Breath   PCP: Purvis Kilts, MD CARDIOLOGIST: [facility], Idelia Salm, MD The history is provided by the patient. No language interpreter was used.   HPI Comments: [name] is a 53 y.o. male, with a history of HTN, CABG x 4 (03/2008), CAD, DM TII, HLD, COPD, SOB, OSA and morbid obesity, who presents to the Emergency Department complaining of SOB with associated worsening, constant right sided chest pain onset last night after a fall. Pt  Picked up a tree branch yesterday and began to experience left sided chest pain radiating down his arms. When pt began to lower the tree branch to the ground he fell and consequently began to experience right sided chest pain. Pt reports that he took Nitroglycerin 0.4 MG spray x2 last night with relief, particularly for his left sided chest pain.   Pt reports at baseline he experiences left sided chest pain every morning before he takes his meds. Pt reports no left chest pain presently.   Pt reports his right sided chest pain was intermittent last night but is now constant. Pt further states that the right chest pain is aggravated with deep breaths. Pt also complains of associated cough and minor abd pain, however, denies fever, neck pain or back pain.Pt denies being on any blood thinners. Pt is a current every day smoker who has been smoking .5 ppd for the past 30 years. Pt reports that his last catheterization and stress tests were one year ago. Further, pt reports that Dr. Donata Clay is his new cardiologist and no testing has been done yet.    Past Medical History  Diagnosis Date  . CAD (coronary artery  disease) 2008    bypass grafting   . Morbid obesity   . PAF (paroxysmal atrial fibrillation)   . HTN (hypertension)   . COPD (chronic obstructive pulmonary disease)   . Hyperlipidemia   . Sleep apnea   . S/P colonoscopy 2009    3-4 mm transverse colon erosions likely secondary to  ASA  . S/P endoscopy Dec 2011    moderate erosive gastritis, Barrett's esophagus 1-2cm  . Myocardial infarction 2008,2009,2009  . Chronic kidney disease     hx of kidney stones  . GERD (gastroesophageal reflux disease)   . Barrett's esophagus   . Depression   . Glaucoma   . SOB (shortness of breath) 11/03/2007    2D Echo EF 50%-55%  . Abnormal myocardial perfusion study 01/01/2011    there a small to moderate sized inferobasal scar  . OSA (obstructive sleep apnea)     on cpap  . Diabetes     type 2 diabetes mellitus  . Claudication 11/16/2011    PV test perform shows normal   Past Surgical History  Procedure Laterality Date  . Cabg x 4  03/2008  . Hernia repair      ventral hernia repair  . Coronary artery bypass graft  2008    4 vessels  . Cardiac catheterization  2009    stent placement to the left circumflex a 2.25   . Savory dilation  09/06/2011  Procedure: SAVORY DILATION;  Surgeon: Dorothyann Peng, MD;  Location: [facility];  Service: Endoscopy;  Laterality: N/A;  Dilated with 56mm  . Esophagogastroduodenoscopy  09/2011    Barrett's esophagus, no dysplasia on biopsies. Distal esophagitis. Status post dilation. Moderate gastritis and duodenitis, but biopsies benign. Next EGD in November 2015 for surveillance of Barrett's esophagus.  . Cardiac catheterization  07/08/2010  . Coronary stent placement  12/29/12  . Coronary stent placement  12/2012   Family History  Problem Relation Age of Onset  . Colon cancer Neg Hx   . Anesthesia problems Neg Hx   . Hypotension Neg Hx   . Malignant hyperthermia Neg Hx   . Pseudochol deficiency Neg Hx    History  Substance Use Topics  . Smoking status:  Current Every Day Smoker -- 0.50 packs/day for 30 years    Types: Cigarettes  . Smokeless tobacco: Not on file  . Alcohol Use: Yes     Comment: a few beers thru the week    Review of Systems  Constitutional: Negative for fever.  Respiratory: Positive for shortness of breath.   Cardiovascular: Positive for chest pain (right sided).  Gastrointestinal: Positive for abdominal pain.    A complete 10 system review of systems was obtained and all systems are negative except as noted in the HPI and PMH.    Allergies  Contrast media and Iohexol  Home Medications   Prior to Admission medications   Medication Sig Start Date End Date Taking? Authorizing Provider  amLODipine (NORVASC) 2.5 MG tablet Take 1 tablet (2.5 mg total) by mouth daily. 03/14/13   Mihai Croitoru, MD  aspirin 81 MG tablet Take 81 mg by mouth daily.    Historical Provider, MD  Choline Fenofibrate (FENOFIBRIC ACID) 135 MG CPDR TAKE 1 CAPSULE BY MOUTH EVERY NIGHT AT BEDTIME 02/11/14   Mihai Croitoru, MD  CRESTOR 20 MG tablet TAKE ONE TABLET BY MOUTH EVERY DAY 03/19/13   Mihai Croitoru, MD  furosemide (LASIX) 40 MG tablet TAKE 2 TABLETS BY MOUTH EVERY DAY    Mihai Croitoru, MD  hydrocortisone (ANUSOL-HC) 25 MG suppository Place 1 suppository (25 mg total) rectally every 12 (twelve) hours. For 12 days 02/28/13   Danie Binder, MD  isosorbide mononitrate (IMDUR) 60 MG 24 hr tablet TAKE 1 TABLET BY MOUTH TWICE DAILY 02/11/14   Mihai Croitoru, MD  lisinopril (PRINIVIL,ZESTRIL) 5 MG tablet Take 5 mg by mouth daily.    Historical Provider, MD  metoprolol (LOPRESSOR) 50 MG tablet Take 0.5 tablets (25 mg total) by mouth 2 (two) times daily. 06/19/13   Mihai Croitoru, MD  niacin (NIASPAN) 1000 MG CR tablet Take 1,000 mg by mouth at bedtime.     Historical Provider, MD  nitroGLYCERIN (NITROLINGUAL) 0.4 MG/SPRAY spray Place 1 spray under the tongue every 5 (five) minutes as needed for chest pain. 03/14/13   Mihai Croitoru, MD  pantoprazole  (PROTONIX) 40 MG tablet 1 PO 30 MINS PRIOR TO BREAKFAST 02/28/13   Danie Binder, MD  potassium chloride SA (K-DUR,KLOR-CON) 20 MEQ tablet Take 1 tablet (20 mEq total) by mouth 2 (two) times daily. 07/17/13   Mihai Croitoru, MD  prasugrel (EFFIENT) 10 MG TABS Take 10 mg by mouth daily.    Historical Provider, MD  ranolazine (RANEXA) 1000 MG SR tablet Take 1 tablet (1,000 mg total) by mouth 2 (two) times daily. 07/17/13   Mihai Croitoru, MD  sitaGLIPtin (JANUVIA) 100 MG tablet Take 100 mg by mouth daily.  Historical Provider, MD   Triage Vitals: BP 144/96  Pulse 81  Temp(Src) 97.8 F (36.6 C) (Oral)  Resp 22  Ht 5\' 10"  (1.778 m)  Wt 311 lb (141.069 kg)  BMI 44.62 kg/m2  SpO2 97% Physical Exam  Nursing note and vitals reviewed. Constitutional: He is oriented to person, place, and time. He appears well-developed and well-nourished. No distress.  Uncomfortable  HENT:  Head: Normocephalic and atraumatic.  Mouth/Throat: Oropharynx is clear and moist. No oropharyngeal exudate.  Eyes: Conjunctivae and EOM are normal. Pupils are equal, round, and reactive to light.  Neck: Normal range of motion. Neck supple. No tracheal deviation present.  Cardiovascular: Normal rate, regular rhythm and normal heart sounds.   No murmur heard. Pulmonary/Chest: Effort normal. No respiratory distress. He exhibits tenderness.  TTP R anterior lateral ribs, no ecchymosis, no crepitance  Abdominal: Soft. There is tenderness (Mild upper quadrant pain. ).  Musculoskeletal: Normal range of motion. He exhibits no tenderness.  Tenderness anterior right of the rib.  NO CTL spine tenderness  Neurological: He is alert and oriented to person, place, and time. No cranial nerve deficit. He exhibits normal muscle tone. Coordination normal.  CN 2-12 intact, no ataxia on finger to nose, no nystagmus, 5/5 strength throughout, no pronator drift, Romberg negative, normal gait.   Skin: Skin is warm and dry.  Psychiatric: He has a  normal mood and affect. His behavior is normal.    ED Course  Procedures (including critical care time) DIAGNOSTIC STUDIES: Oxygen Saturation is 97% on RA, adequate by my interpretation.    COORDINATION OF CARE:  6:29 PM-Discussed treatment plan which includes repeat EKG; imaging; meds and labs with pt at bedside and pt agreed to plan.   Labs Review Labs Reviewed  CBC WITH DIFFERENTIAL - Abnormal; Notable for the following:    WBC 11.1 (*)    All other components within normal limits  COMPREHENSIVE METABOLIC PANEL - Abnormal; Notable for the following:    Glucose, Bld 120 (*)    ALT 54 (*)    GFR calc non Af Amer 74 (*)    GFR calc Af Amer 85 (*)    All other components within normal limits  PROTIME-INR  TROPONIN I  PRO B NATRIURETIC PEPTIDE    Imaging Review Ct Abdomen Pelvis Wo Contrast  03/15/2014   CLINICAL DATA:  Patient status post trauma to the right chest a few days ago.  EXAM: CT CHEST, ABDOMEN AND PELVIS WITHOUT CONTRAST  TECHNIQUE: Multidetector CT imaging of the chest, abdomen and pelvis was performed following the standard protocol without IV contrast.  COMPARISON:  CT ABD/PELV WO CM dated 06/02/2010; DG CHEST 1V PORT dated 03/15/2014; CT CHEST W/O CM dated 01/02/2008; DG CHEST 2 VIEW dated 12/11/2007  FINDINGS: CT CHEST FINDINGS  Visualized thyroid is unremarkable. No enlarged axillary, mediastinal or hilar lymphadenopathy. Normal heart size. No pericardial effusion. Coronary arterial calcifications. Status post median sternotomy and CABG procedure.  Central airways are patent. Multiple bilateral pulmonary nodules are demonstrated. May of these nodules were present on prior chest CT 01/02/2008. There are a few new pulmonary nodules visualized and referenced as follows: 3 mm left upper lobe nodule (image 22; series 3); 6 mm left upper lobe pulmonary nodule (image 14; series 3). Extensive centrilobular and paraseptal emphysematous change. Dependent atelectasis within the right  greater than left lower lobes. There is a very small paramediastinal pneumothorax within the right hemi thorax (image 28; series 2).  CT ABDOMEN AND PELVIS FINDINGS  Lack of intravenous contrast material limits evaluation of the solid organ parenchyma. Liver is diffusely low in attenuation compatible with hepatic steatosis. Spleen, pancreas and bilateral adrenal glands are unremarkable. Kidneys are symmetric in size. No hydronephrosis.  Normal caliber abdominal aorta with scattered calcified atherosclerotic plaque. Urinary bladder is unremarkable. Prostate is unremarkable.  No abnormal bowel wall thickening. No evidence for bowel obstruction. No free fluid or free intraperitoneal air. Normal appendix.  IMPRESSION: 1. Tiny right paramediastinal pneumothorax. 2. Multiple bilateral pulmonary nodules, many of which are stable since 2009. There are a few new pulmonary nodules as detailed above common with the largest measuring 6 mm. If the patient is at high risk for bronchogenic carcinoma, follow-up chest CT at 6-12 months is recommended. If the patient is at low risk for bronchogenic carcinoma, follow-up chest CT at 12 months is recommended. This recommendation follows the consensus statement: Guidelines for Management of Small Pulmonary Nodules Detected on CT Scans: A Statement from the [employer] as published in Radiology 2005;237:395-400. 3. Hepatic steatosis Critical Value/emergent results were called by telephone at the time of interpretation on 03/15/2014 at 8:10 PM to Dr. Ezequiel Essex , who verbally acknowledged these results.   Electronically Signed   By: Lovey Newcomer M.D.   On: 03/15/2014 20:13   Ct Chest Wo Contrast  03/15/2014   CLINICAL DATA:  Patient status post trauma to the right chest a few days ago.  EXAM: CT CHEST, ABDOMEN AND PELVIS WITHOUT CONTRAST  TECHNIQUE: Multidetector CT imaging of the chest, abdomen and pelvis was performed following the standard protocol without IV contrast.   COMPARISON:  CT ABD/PELV WO CM dated 06/02/2010; DG CHEST 1V PORT dated 03/15/2014; CT CHEST W/O CM dated 01/02/2008; DG CHEST 2 VIEW dated 12/11/2007  FINDINGS: CT CHEST FINDINGS  Visualized thyroid is unremarkable. No enlarged axillary, mediastinal or hilar lymphadenopathy. Normal heart size. No pericardial effusion. Coronary arterial calcifications. Status post median sternotomy and CABG procedure.  Central airways are patent. Multiple bilateral pulmonary nodules are demonstrated. May of these nodules were present on prior chest CT 01/02/2008. There are a few new pulmonary nodules visualized and referenced as follows: 3 mm left upper lobe nodule (image 22; series 3); 6 mm left upper lobe pulmonary nodule (image 14; series 3). Extensive centrilobular and paraseptal emphysematous change. Dependent atelectasis within the right greater than left lower lobes. There is a very small paramediastinal pneumothorax within the right hemi thorax (image 28; series 2).  CT ABDOMEN AND PELVIS FINDINGS  Lack of intravenous contrast material limits evaluation of the solid organ parenchyma. Liver is diffusely low in attenuation compatible with hepatic steatosis. Spleen, pancreas and bilateral adrenal glands are unremarkable. Kidneys are symmetric in size. No hydronephrosis.  Normal caliber abdominal aorta with scattered calcified atherosclerotic plaque. Urinary bladder is unremarkable. Prostate is unremarkable.  No abnormal bowel wall thickening. No evidence for bowel obstruction. No free fluid or free intraperitoneal air. Normal appendix.  IMPRESSION: 1. Tiny right paramediastinal pneumothorax. 2. Multiple bilateral pulmonary nodules, many of which are stable since 2009. There are a few new pulmonary nodules as detailed above common with the largest measuring 6 mm. If the patient is at high risk for bronchogenic carcinoma, follow-up chest CT at 6-12 months is recommended. If the patient is at low risk for bronchogenic carcinoma,  follow-up chest CT at 12 months is recommended. This recommendation follows the consensus statement: Guidelines for Management of Small Pulmonary Nodules Detected on CT Scans: A Statement from the [employer] as  published in Radiology 2005;237:395-400. 3. Hepatic steatosis Critical Value/emergent results were called by telephone at the time of interpretation on 03/15/2014 at 8:10 PM to Dr. Ezequiel Essex , who verbally acknowledged these results.   Electronically Signed   By: Lovey Newcomer M.D.   On: 03/15/2014 20:13   Dg Chest Portable 1 View  03/15/2014   CLINICAL DATA:  Shortness of breath and chest pain  EXAM: PORTABLE CHEST - 1 VIEW  COMPARISON:  May 14, 2013  FINDINGS: There is underlying emphysematous change. There is no edema or consolidation. Heart is upper normal in size with normal pulmonary vascularity. Patient is status post median sternotomy ; multiple sternal wires are fractured, a finding noted previously. No adenopathy. No pneumothorax. No bone lesions.  IMPRESSION: Underlying emphysematous change.  No edema or consolidation.   Electronically Signed   By: Lowella Grip M.D.   On: 03/15/2014 18:22     EKG Interpretation   Date/Time:  Friday Mar 15 2014 18:47:39 EDT Ventricular Rate:  81 PR Interval:  147 QRS Duration: 92 QT Interval:  398 QTC Calculation: 462 R Axis:   36 Text Interpretation:  Sinus rhythm Low voltage, precordial leads Abnormal  R-wave progression, early transition Nonspecific T abnormalities, lateral  leads ST elevation II, III Confirmed by Nisaiah Bechtol  MD, Jahvier Aldea [id number]) on  03/15/2014 6:54:57 PM      MDM   Final diagnoses:  Chest pain  Chest wall contusion   R sided chest pain with SOB after falling onto tree limb.  Proceeded by L chest pain going down arm, similar to previous angina, relieved with nitro.   TTP R chest wall and lateral ribs without crepitance.  EKG shows subtle ST elevation in lead 3 and aVF. This is discussed with on-call  interventional is Dr. Angelena Form. This is similar to his previous EKG last May. He is having no typical angina at this time. Dr. Angelena Form agrees does not represent STEMI.  first troponin is negative. No evidence of rib fracture or pneumothorax on x-ray. Given the degree of Discomfort we'll obtain CT scan.  Discussed with on-call cardiologist Dr. Angelena Form. Patient's has had symptoms of angina daily for the past several weeks. This preceded his fall and chest contusion. He is not having angina currently. He agrees with holding heparin at this time.  There is no cardiologist at [facility] on the weekend. Patient will be admitted to [facility] for rule out.  Trauma imaging does show tiny right sided paramediastinal pneumothorax. Multiple pulmonary nodules. No other traumatic injury. Patient no distress. Saturation 98% on 2 L. Cardiology updated regarding pneumothorax. We'll consult trauma.  CRITICAL CARE Performed by: Ezequiel Essex Total critical care time: 30 Critical care time was exclusive of separately billable procedures and treating other patients. Critical care was necessary to treat or prevent imminent or life-threatening deterioration. Critical care was time spent personally by me on the following activities: development of treatment plan with patient and/or surrogate as well as nursing, discussions with consultants, evaluation of patient's response to treatment, examination of patient, obtaining history from patient or surrogate, ordering and performing treatments and interventions, ordering and review of laboratory studies, ordering and review of radiographic studies, pulse oximetry and re-evaluation of patient's condition.   I personally performed the services described in this documentation, which was scribed in my presence. The recorded information has been reviewed and is accurate.    Ezequiel Essex, MD 03/16/14 [id number]  Ezequiel Essex, MD 03/16/14 [phone]

## 2014-03-15 NOTE — H&P (Signed)
History and Physical  Patient ID: [name] MRN: [id number], SOB: 02-05-1961 53 y.o. Date of Encounter: 03/15/2014, 9:07 PM   Primary Physician: Purvis Kilts, MD Primary Cardiologist: Dr. Recardo Evangelist previously, now sees Dr. Donata Clay at [facility] Complaint: R wall chest pain s/p injury, also with angina  HPI: 53 y.o. male w/ PMHx significant for CAD s/p CABG, PAF, HTN, COPD who presented to [facility] on 03/15/2014 with complaints of R wall chest after a fall while working in his yard with trauma to his right chest wall. In addition, also reports having 1 month of increasing angina.  Initially, ER was concerned regarding a possible STEMI and spoke with interventionalist on call who noted that the EKG changes were stable from prior. Initial troponin also negative.  Concern for admission persisted though due to reported increasing typical angina (which is class III). Occurs daily, taking nitro every other day. Notes from Dr. Recardo Evangelist indicate rather severe angina, no good targets based upon prior cath films. Previously got a second opinion from [facility] who actually mentioned transplant to them. States that over the last 1.5 months, symptoms have worsened.  EKG revealed with .5 mm ST elevation in II and III, flattened T wave anteriorly (similar to prior). CXR was without acute cardiopulmonary abnormalities. However, trauma non contrast chest CT noted small PTX posteriorly. Trauma team notified by ER.  Currently chest pain free now. Worse with inspiration.    Past Medical History  Diagnosis Date  . CAD (coronary artery disease) 2008    bypass grafting   . Morbid obesity   . PAF (paroxysmal atrial fibrillation)   . HTN (hypertension)   . COPD (chronic obstructive pulmonary disease)   . Hyperlipidemia   . Sleep apnea   . S/P colonoscopy 2009    3-4 mm transverse colon erosions likely secondary to  ASA  . S/P endoscopy Dec 2011    moderate erosive gastritis, Barrett's  esophagus 1-2cm  . Myocardial infarction 2008,2009,2009  . Chronic kidney disease     hx of kidney stones  . GERD (gastroesophageal reflux disease)   . Barrett's esophagus   . Depression   . Glaucoma   . SOB (shortness of breath) 11/03/2007    2D Echo EF 50%-55%  . Abnormal myocardial perfusion study 01/01/2011    there a small to moderate sized inferobasal scar  . OSA (obstructive sleep apnea)     on cpap  . Diabetes     type 2 diabetes mellitus  . Claudication 11/16/2011    PV test perform shows normal     Surgical History:  Past Surgical History  Procedure Laterality Date  . Cabg x 4  03/2008  . Hernia repair      ventral hernia repair  . Coronary artery bypass graft  2008    4 vessels  . Cardiac catheterization  2009    stent placement to the left circumflex a 2.25   . Savory dilation  09/06/2011    Procedure: SAVORY DILATION;  Surgeon: Dorothyann Peng, MD;  Location: [facility];  Service: Endoscopy;  Laterality: N/A;  Dilated with 34mm  . Esophagogastroduodenoscopy  09/2011    Barrett's esophagus, no dysplasia on biopsies. Distal esophagitis. Status post dilation. Moderate gastritis and duodenitis, but biopsies benign. Next EGD in November 2015 for surveillance of Barrett's esophagus.  . Cardiac catheterization  07/08/2010  . Coronary stent placement  12/29/12  . Coronary stent placement  12/2012     Home Meds: Prior  to Admission medications   Medication Sig Start Date End Date Taking? Authorizing Provider  aspirin EC 81 MG tablet Take 81 mg by mouth 2 (two) times daily.   Yes Historical Provider, MD  Choline Fenofibrate (FENOFIBRIC ACID) 135 MG CPDR Take 135 mg by mouth at bedtime.   Yes Historical Provider, MD  diazepam (VALIUM) 5 MG tablet Take 5 mg by mouth 2 (two) times daily.   Yes Historical Provider, MD  furosemide (LASIX) 40 MG tablet Take 80 mg by mouth every morning.   Yes Historical Provider, MD  isosorbide mononitrate (IMDUR) 60 MG 24 hr tablet Take 60 mg by  mouth 2 (two) times daily.   Yes Historical Provider, MD  lisinopril (PRINIVIL,ZESTRIL) 5 MG tablet Take 5 mg by mouth every morning.    Yes Historical Provider, MD  metoprolol (LOPRESSOR) 100 MG tablet Take 100 mg by mouth 2 (two) times daily.   Yes Historical Provider, MD  nitroGLYCERIN (NITROLINGUAL) 0.4 MG/SPRAY spray Place 1 spray under the tongue every 5 (five) minutes as needed for chest pain. 03/14/13  Yes Mihai Croitoru, MD  pantoprazole (PROTONIX) 40 MG tablet Take 40 mg by mouth every morning.   Yes Historical Provider, MD  potassium chloride SA (K-DUR,KLOR-CON) 20 MEQ tablet Take 1 tablet (20 mEq total) by mouth 2 (two) times daily. 07/17/13  Yes Mihai Croitoru, MD  prasugrel (EFFIENT) 10 MG TABS Take 10 mg by mouth every morning.    Yes Historical Provider, MD  ranolazine (RANEXA) 1000 MG SR tablet Take 1 tablet (1,000 mg total) by mouth 2 (two) times daily. 07/17/13  Yes Mihai Croitoru, MD  rosuvastatin (CRESTOR) 20 MG tablet Take 20 mg by mouth every morning.   Yes Historical Provider, MD  sitaGLIPtin (JANUVIA) 100 MG tablet Take 100 mg by mouth every morning.    Yes Historical Provider, MD    Allergies:  Allergies  Allergen Reactions  . Contrast Media [Iodinated Diagnostic Agents]   . Iohexol      Desc: PT. STATES HEART STOPPED HAS TO BE PREMED.     History   Social History  . Marital Status: Married    Spouse Name: N/A    Number of Children: N/A  . Years of Education: N/A   Occupational History  . Not on file.   Social History Main Topics  . Smoking status: Current Every Day Smoker -- 0.50 packs/day for 30 years    Types: Cigarettes  . Smokeless tobacco: Not on file  . Alcohol Use: Yes     Comment: a few beers thru the week  . Drug Use: No  . Sexual Activity: Not on file   Other Topics Concern  . Not on file   Social History Narrative  . No narrative on file     Family History  Problem Relation Age of Onset  . Colon cancer Neg Hx   . Anesthesia  problems Neg Hx   . Hypotension Neg Hx   . Malignant hyperthermia Neg Hx   . Pseudochol deficiency Neg Hx     Review of Systems: General: negative for chills, fever, night sweats or weight changes.  Cardiovascular: as per HPI Dermatological: negative for rash Respiratory: negative for cough or wheezing Urologic: negative for hematuria Abdominal: negative for nausea, vomiting, diarrhea, bright red blood per rectum, melena, or hematemesis Neurologic: negative for visual changes, syncope, or dizziness All other systems reviewed and are otherwise negative except as noted above.  Labs:   Lab Results  Component Value  Date   WBC 11.1* 03/15/2014   HGB 16.2 03/15/2014   HCT 45.6 03/15/2014   MCV 93.3 03/15/2014   PLT 272 03/15/2014    Recent Labs Lab 03/15/14 1805  NA 138  K 3.7  CL 100  CO2 23  BUN 9  CREATININE 1.13  CALCIUM 9.3  PROT 7.0  BILITOT 0.4  ALKPHOS 78  ALT 54*  AST 32  GLUCOSE 120*    Recent Labs  03/15/14 1805  TROPONINI <0.30   Lab Results  Component Value Date   CHOL  Value: 166        ATP III CLASSIFICATION:  <200     mg/dL   Desirable  200-239  mg/dL   Borderline High  >=240    mg/dL   High 01/12/2008   HDL 26* 01/12/2008   LDLCALC  Value: 89        Total Cholesterol/HDL:CHD Risk Coronary Heart Disease Risk Table                     Men   Women  1/2 Average Risk   3.4   3.3 01/12/2008   TRIG 257* 01/12/2008   No results found for this basename: DDIMER    Radiology/Studies:  Ct Abdomen Pelvis Wo Contrast  03/15/2014   CLINICAL DATA:  Patient status post trauma to the right chest a few days ago.  EXAM: CT CHEST, ABDOMEN AND PELVIS WITHOUT CONTRAST  TECHNIQUE: Multidetector CT imaging of the chest, abdomen and pelvis was performed following the standard protocol without IV contrast.  COMPARISON:  CT ABD/PELV WO CM dated 06/02/2010; DG CHEST 1V PORT dated 03/15/2014; CT CHEST W/O CM dated 01/02/2008; DG CHEST 2 VIEW dated 12/11/2007  FINDINGS: CT CHEST FINDINGS   Visualized thyroid is unremarkable. No enlarged axillary, mediastinal or hilar lymphadenopathy. Normal heart size. No pericardial effusion. Coronary arterial calcifications. Status post median sternotomy and CABG procedure.  Central airways are patent. Multiple bilateral pulmonary nodules are demonstrated. May of these nodules were present on prior chest CT 01/02/2008. There are a few new pulmonary nodules visualized and referenced as follows: 3 mm left upper lobe nodule (image 22; series 3); 6 mm left upper lobe pulmonary nodule (image 14; series 3). Extensive centrilobular and paraseptal emphysematous change. Dependent atelectasis within the right greater than left lower lobes. There is a very small paramediastinal pneumothorax within the right hemi thorax (image 28; series 2).  CT ABDOMEN AND PELVIS FINDINGS  Lack of intravenous contrast material limits evaluation of the solid organ parenchyma. Liver is diffusely low in attenuation compatible with hepatic steatosis. Spleen, pancreas and bilateral adrenal glands are unremarkable. Kidneys are symmetric in size. No hydronephrosis.  Normal caliber abdominal aorta with scattered calcified atherosclerotic plaque. Urinary bladder is unremarkable. Prostate is unremarkable.  No abnormal bowel wall thickening. No evidence for bowel obstruction. No free fluid or free intraperitoneal air. Normal appendix.  IMPRESSION: 1. Tiny right paramediastinal pneumothorax. 2. Multiple bilateral pulmonary nodules, many of which are stable since 2009. There are a few new pulmonary nodules as detailed above common with the largest measuring 6 mm. If the patient is at high risk for bronchogenic carcinoma, follow-up chest CT at 6-12 months is recommended. If the patient is at low risk for bronchogenic carcinoma, follow-up chest CT at 12 months is recommended. This recommendation follows the consensus statement: Guidelines for Management of Small Pulmonary Nodules Detected on CT Scans: A  Statement from the Manuel Garcia as published in Radiology 2005;237:395-400. 3.  Hepatic steatosis Critical Value/emergent results were called by telephone at the time of interpretation on 03/15/2014 at 8:10 PM to Dr. Ezequiel Essex , who verbally acknowledged these results.   Electronically Signed   By: Lovey Newcomer M.D.   On: 03/15/2014 20:13   Dg Chest Portable 1 View  03/15/2014   CLINICAL DATA:  Shortness of breath and chest pain  EXAM: PORTABLE CHEST - 1 VIEW  COMPARISON:  May 14, 2013  FINDINGS: There is underlying emphysematous change. There is no edema or consolidation. Heart is upper normal in size with normal pulmonary vascularity. Patient is status post median sternotomy ; multiple sternal wires are fractured, a finding noted previously. No adenopathy. No pneumothorax. No bone lesions.  IMPRESSION: Underlying emphysematous change.  No edema or consolidation.   Electronically Signed   By: Lowella Grip M.D.   On: 03/15/2014 18:22     EKG: see HPI  Physical Exam: Blood pressure 110/75, pulse 81, temperature 98.5 F (36.9 C), temperature source Oral, resp. rate 19, height 5\' 10"  (1.778 m), weight 141.069 kg (311 lb), SpO2 97.00%. General: morbidly obese, NAD Head: Normocephalic, atraumatic, sclera non-icteric, nares are without discharge Neck: no neck due to body habitus Lungs: Clear bilaterally to auscultation without wheezes, rales, or rhonchi. Unable to take deep breathes Heart: RRR with S1 S2. Distant heart sounds. No murmurs, rubs, or gallops appreciated. Tender R chest wall, no sign bruising. Abdomen: Soft, non-tender, non-distended with normoactive bowel sounds. No rebound/guarding. No obvious abdominal masses. Msk:  Strength and tone appear normal for age. Extremities: No edema. No clubbing or cyanosis. Distal pedal pulses are 2+ and equal bilaterally. Neuro: Alert and oriented X 3. Moves all extremities spontaneously. Psych:  Responds to questions appropriately with a  normal affect.    ASSESSMENT AND PLAN:  Problem List 1. Angina, Class III-IV 2. CAD s/p CABG, s/p PCI 3. Trauma to chest wall, R side, small pneumonthorax 3. Continued tobacco abuse 4. Pulmonary nodules seen on chest ct, due to smoking will need followup scan 5. HTN 6. Morbid obesity 7. Sleep apnea 8. Diabetes 9. Barretts  53 y.o. male w/ PMHx significant for CAD s/p CABG, PAF, HTN, COPD who presented to [facility] on 03/15/2014 with complaints of R wall chest after a fall while working in his yard with trauma to his right chest wall. In addition, also reports having 1 month of increasing angina.  Regarding his R wall chest pain --> non cardiac and due to minor trauma and likely pleurisy from the small PTX seen on CT. Trauma surgery notified of admission by ER but nonurgent at this time. Will manage pain with narcotics and get followup 2 view cxray in the AM.  Regarding his increasing almost daily angina, unfortunate situation as reportedly he has no good targets for revascularization. On impressive antianginal regimen of ranolazine, imdur and beta blocker. will increase imdur to 60 tid (CCB is another possibility). Had previously had discussion at [facility] regarding possible heart transplant?- with his class III to IV symptoms may need to consider this avenue if he trully does not have any targets.  Serial troponins though doubt acute ischemia. Continue statin, aspirin, prasugrel.  Sliding scale for DM  Smoking cessation emphasized.  Cardiac diet PPI Full code  Signed, Cletus Gash MD 03/15/2014, 9:07 PM

## 2014-03-16 ENCOUNTER — Other Ambulatory Visit (HOSPITAL_COMMUNITY): Payer: BC Managed Care – PPO

## 2014-03-16 DIAGNOSIS — J9383 Other pneumothorax: Secondary | ICD-10-CM | POA: Diagnosis not present

## 2014-03-16 DIAGNOSIS — S20219A Contusion of unspecified front wall of thorax, initial encounter: Secondary | ICD-10-CM | POA: Diagnosis not present

## 2014-03-16 DIAGNOSIS — R911 Solitary pulmonary nodule: Secondary | ICD-10-CM

## 2014-03-16 DIAGNOSIS — R079 Chest pain, unspecified: Secondary | ICD-10-CM | POA: Diagnosis not present

## 2014-03-16 DIAGNOSIS — I2510 Atherosclerotic heart disease of native coronary artery without angina pectoris: Secondary | ICD-10-CM

## 2014-03-16 DIAGNOSIS — C3490 Malignant neoplasm of unspecified part of unspecified bronchus or lung: Secondary | ICD-10-CM

## 2014-03-16 DIAGNOSIS — Z85118 Personal history of other malignant neoplasm of bronchus and lung: Secondary | ICD-10-CM

## 2014-03-16 DIAGNOSIS — J939 Pneumothorax, unspecified: Secondary | ICD-10-CM

## 2014-03-16 DIAGNOSIS — I209 Angina pectoris, unspecified: Secondary | ICD-10-CM | POA: Diagnosis not present

## 2014-03-16 LAB — BASIC METABOLIC PANEL
BUN: 11 mg/dL (ref 6–23)
CALCIUM: 8.9 mg/dL (ref 8.4–10.5)
CO2: 25 mEq/L (ref 19–32)
CREATININE: 1.15 mg/dL (ref 0.50–1.35)
Chloride: 104 mEq/L (ref 96–112)
GFR, EST AFRICAN AMERICAN: 83 mL/min — AB (ref >90)
GFR, EST NON AFRICAN AMERICAN: 72 mL/min — AB (ref >90)
Glucose, Bld: 138 mg/dL — ABNORMAL HIGH (ref 70–99)
Potassium: 3.4 mEq/L — ABNORMAL LOW (ref 3.7–5.3)
Sodium: 141 mEq/L (ref 137–147)

## 2014-03-16 LAB — GLUCOSE, CAPILLARY: Glucose-Capillary: 119 mg/dL — ABNORMAL HIGH (ref 70–99)

## 2014-03-16 LAB — CREATININE, SERUM
Creatinine, Ser: 1.12 mg/dL (ref 0.50–1.35)
GFR calc non Af Amer: 74 mL/min — ABNORMAL LOW (ref >90)
GFR, EST AFRICAN AMERICAN: 86 mL/min — AB (ref >90)

## 2014-03-16 LAB — CBC
HCT: 44.6 % (ref 39.0–52.0)
Hemoglobin: 15.2 g/dL (ref 13.0–17.0)
MCH: 32.5 pg (ref 26.0–34.0)
MCHC: 34.1 g/dL (ref 30.0–36.0)
MCV: 95.5 fL (ref 78.0–100.0)
PLATELETS: 243 10*3/uL (ref 150–400)
RBC: 4.67 MIL/uL (ref 4.22–5.81)
RDW: 12.9 % (ref 11.5–15.5)
WBC: 10 10*3/uL (ref 4.0–10.5)

## 2014-03-16 LAB — TROPONIN I

## 2014-03-16 MED ORDER — POTASSIUM CHLORIDE CRYS ER 20 MEQ PO TBCR
40.0000 meq | EXTENDED_RELEASE_TABLET | Freq: Once | ORAL | Status: AC
  Administered 2014-03-16: 40 meq via ORAL
  Filled 2014-03-16: qty 2

## 2014-03-16 MED ORDER — ISOSORBIDE MONONITRATE ER 60 MG PO TB24: 60.0000 mg | ORAL_TABLET | Freq: Three times a day (TID) | ORAL | Status: DC

## 2014-03-16 NOTE — Consult Note (Signed)
Reason for Consult:  Occult right pneumothorax Referring Physician:  Rancour  [name] is an 53 y.o. male.  HPI:  Pt is a 53 yo M with severe CAD who was clearing some limbs on Thursday and started having some chest pain.  He had picked up a large branch, and he started having left sided chest pain.  It was so severe, that he started to lower the branch.  While doing this, he fell and started having right chest pain.  Nitro helped only a bit.  He continues to have right chest pain, especially when he takes a big breath or coughs.  He denies shortness of breath.     Past Medical History  Diagnosis Date  . CAD (coronary artery disease) 2008    bypass grafting   . Morbid obesity   . PAF (paroxysmal atrial fibrillation)   . HTN (hypertension)   . COPD (chronic obstructive pulmonary disease)   . Hyperlipidemia   . Sleep apnea   . S/P colonoscopy 2009    3-4 mm transverse colon erosions likely secondary to  ASA  . S/P endoscopy Dec 2011    moderate erosive gastritis, Barrett's esophagus 1-2cm  . Myocardial infarction 2008,2009,2009  . Chronic kidney disease     hx of kidney stones  . GERD (gastroesophageal reflux disease)   . Barrett's esophagus   . Depression   . Glaucoma   . SOB (shortness of breath) 11/03/2007    2D Echo EF 50%-55%  . Abnormal myocardial perfusion study 01/01/2011    there a small to moderate sized inferobasal scar  . OSA (obstructive sleep apnea)     on cpap  . Diabetes     type 2 diabetes mellitus  . Claudication 11/16/2011    PV test perform shows normal  . Cataracts, bilateral   . Dysrhythmia   . Hernia of abdominal wall     Past Surgical History  Procedure Laterality Date  . Cabg x 4  03/2008  . Hernia repair      ventral hernia repair  . Coronary artery bypass graft  2008    4 vessels  . Cardiac catheterization  2009    stent placement to the left circumflex a 2.25   . Savory dilation  09/06/2011    Procedure: SAVORY DILATION;  Surgeon:  Dorothyann Peng, MD;  Location: [facility];  Service: Endoscopy;  Laterality: N/A;  Dilated with 21m  . Esophagogastroduodenoscopy  09/2011    Barrett's esophagus, no dysplasia on biopsies. Distal esophagitis. Status post dilation. Moderate gastritis and duodenitis, but biopsies benign. Next EGD in November 2015 for surveillance of Barrett's esophagus.  . Cardiac catheterization  07/08/2010  . Coronary stent placement  12/29/12  . Coronary stent placement  12/2012    Family History  Problem Relation Age of Onset  . Colon cancer Neg Hx   . Anesthesia problems Neg Hx   . Hypotension Neg Hx   . Malignant hyperthermia Neg Hx   . Pseudochol deficiency Neg Hx     Social History:  reports that he has been smoking Cigarettes.  He has a 15 pack-year smoking history. He does not have any smokeless tobacco history on file. He reports that he drinks alcohol. He reports that he does not use illicit drugs.  Allergies:  Allergies  Allergen Reactions  . Contrast Media [Iodinated Diagnostic Agents]   . Iohexol      Desc: PT. STATES HEART STOPPED HAS TO BE PREMED.  Medications:  Prior to Admission:  Prescriptions prior to admission  Medication Sig Dispense Refill  . aspirin EC 81 MG tablet Take 81 mg by mouth 2 (two) times daily.      . Choline Fenofibrate (FENOFIBRIC ACID) 135 MG CPDR Take 135 mg by mouth at bedtime.      . diazepam (VALIUM) 5 MG tablet Take 5 mg by mouth 2 (two) times daily.      . furosemide (LASIX) 40 MG tablet Take 80 mg by mouth every morning.      . isosorbide mononitrate (IMDUR) 60 MG 24 hr tablet Take 60 mg by mouth 2 (two) times daily.      Marland Kitchen lisinopril (PRINIVIL,ZESTRIL) 5 MG tablet Take 5 mg by mouth every morning.       . metoprolol (LOPRESSOR) 100 MG tablet Take 100 mg by mouth 2 (two) times daily.      . nitroGLYCERIN (NITROLINGUAL) 0.4 MG/SPRAY spray Place 1 spray under the tongue every 5 (five) minutes as needed for chest pain.  12 g  3  . pantoprazole (PROTONIX)  40 MG tablet Take 40 mg by mouth every morning.      . potassium chloride SA (K-DUR,KLOR-CON) 20 MEQ tablet Take 1 tablet (20 mEq total) by mouth 2 (two) times daily.  60 tablet  10  . prasugrel (EFFIENT) 10 MG TABS Take 10 mg by mouth every morning.       . ranolazine (RANEXA) 1000 MG SR tablet Take 1 tablet (1,000 mg total) by mouth 2 (two) times daily.  60 tablet  10  . rosuvastatin (CRESTOR) 20 MG tablet Take 20 mg by mouth every morning.      . sitaGLIPtin (JANUVIA) 100 MG tablet Take 100 mg by mouth every morning.         Results for orders placed during the hospital encounter of 03/15/14 (from the past 48 hour(s))  CBC WITH DIFFERENTIAL     Status: Abnormal   Collection Time    03/15/14  6:05 PM      Result Value Ref Range   WBC 11.1 (*) 4.0 - 10.5 K/uL   RBC 4.89  4.22 - 5.81 MIL/uL   Hemoglobin 16.2  13.0 - 17.0 g/dL   HCT 45.6  39.0 - 52.0 %   MCV 93.3  78.0 - 100.0 fL   MCH 33.1  26.0 - 34.0 pg   MCHC 35.5  30.0 - 36.0 g/dL   RDW 12.6  11.5 - 15.5 %   Platelets 272  150 - 400 K/uL   Neutrophils Relative % 58  43 - 77 %   Neutro Abs 6.6  1.7 - 7.7 K/uL   Lymphocytes Relative 29  12 - 46 %   Lymphs Abs 3.2  0.7 - 4.0 K/uL   Monocytes Relative 7  3 - 12 %   Monocytes Absolute 0.7  0.1 - 1.0 K/uL   Eosinophils Relative 5  0 - 5 %   Eosinophils Absolute 0.5  0.0 - 0.7 K/uL   Basophils Relative 1  0 - 1 %   Basophils Absolute 0.1  0.0 - 0.1 K/uL  COMPREHENSIVE METABOLIC PANEL     Status: Abnormal   Collection Time    03/15/14  6:05 PM      Result Value Ref Range   Sodium 138  137 - 147 mEq/L   Potassium 3.7  3.7 - 5.3 mEq/L   Chloride 100  96 - 112 mEq/L   CO2 23  19 - 32 mEq/L   Glucose, Bld 120 (*) 70 - 99 mg/dL   BUN 9  6 - 23 mg/dL   Creatinine, Ser 1.13  0.50 - 1.35 mg/dL   Calcium 9.3  8.4 - 10.5 mg/dL   Total Protein 7.0  6.0 - 8.3 g/dL   Albumin 3.6  3.5 - 5.2 g/dL   AST 32  0 - 37 U/L   ALT 54 (*) 0 - 53 U/L   Alkaline Phosphatase 78  39 - 117 U/L    Total Bilirubin 0.4  0.3 - 1.2 mg/dL   GFR calc non Af Amer 74 (*) >90 mL/min   GFR calc Af Amer 85 (*) >90 mL/min   Comment: (NOTE)     The eGFR has been calculated using the CKD EPI equation.     This calculation has not been validated in all clinical situations.     eGFR's persistently <90 mL/min signify possible Chronic Kidney     Disease.  PROTIME-INR     Status: None   Collection Time    03/15/14  6:05 PM      Result Value Ref Range   Prothrombin Time 13.6  11.6 - 15.2 seconds   INR 1.06  0.00 - 1.49  TROPONIN I     Status: None   Collection Time    03/15/14  6:05 PM      Result Value Ref Range   Troponin I <0.30  <0.30 ng/mL   Comment:            Due to the release kinetics of cTnI,     a negative result within the first hours     of the onset of symptoms does not rule out     myocardial infarction with certainty.     If myocardial infarction is still suspected,     repeat the test at appropriate intervals.  PRO B NATRIURETIC PEPTIDE     Status: None   Collection Time    03/15/14  6:05 PM      Result Value Ref Range   Pro B Natriuretic peptide (BNP) 27.1  0 - 125 pg/mL  GLUCOSE, CAPILLARY     Status: Abnormal   Collection Time    03/15/14 11:18 PM      Result Value Ref Range   Glucose-Capillary 174 (*) 70 - 99 mg/dL  CBC     Status: None   Collection Time    03/16/14 12:30 AM      Result Value Ref Range   WBC 10.0  4.0 - 10.5 K/uL   RBC 4.67  4.22 - 5.81 MIL/uL   Hemoglobin 15.2  13.0 - 17.0 g/dL   HCT 44.6  39.0 - 52.0 %   MCV 95.5  78.0 - 100.0 fL   MCH 32.5  26.0 - 34.0 pg   MCHC 34.1  30.0 - 36.0 g/dL   RDW 12.9  11.5 - 15.5 %   Platelets 243  150 - 400 K/uL  CREATININE, SERUM     Status: Abnormal   Collection Time    03/16/14 12:30 AM      Result Value Ref Range   Creatinine, Ser 1.12  0.50 - 1.35 mg/dL   GFR calc non Af Amer 74 (*) >90 mL/min   GFR calc Af Amer 86 (*) >90 mL/min   Comment: (NOTE)     The eGFR has been calculated using the CKD EPI  equation.     This calculation has  not been validated in all clinical situations.     eGFR's persistently <90 mL/min signify possible Chronic Kidney     Disease.  TROPONIN I     Status: None   Collection Time    03/16/14  4:00 AM      Result Value Ref Range   Troponin I <0.30  <0.30 ng/mL   Comment:            Due to the release kinetics of cTnI,     a negative result within the first hours     of the onset of symptoms does not rule out     myocardial infarction with certainty.     If myocardial infarction is still suspected,     repeat the test at appropriate intervals.  BASIC METABOLIC PANEL     Status: Abnormal   Collection Time    03/16/14  4:00 AM      Result Value Ref Range   Sodium 141  137 - 147 mEq/L   Potassium 3.4 (*) 3.7 - 5.3 mEq/L   Chloride 104  96 - 112 mEq/L   CO2 25  19 - 32 mEq/L   Glucose, Bld 138 (*) 70 - 99 mg/dL   BUN 11  6 - 23 mg/dL   Creatinine, Ser 1.15  0.50 - 1.35 mg/dL   Calcium 8.9  8.4 - 10.5 mg/dL   GFR calc non Af Amer 72 (*) >90 mL/min   GFR calc Af Amer 83 (*) >90 mL/min   Comment: (NOTE)     The eGFR has been calculated using the CKD EPI equation.     This calculation has not been validated in all clinical situations.     eGFR's persistently <90 mL/min signify possible Chronic Kidney     Disease.    Ct Abdomen Pelvis Wo Contrast  03/15/2014   CLINICAL DATA:  Patient status post trauma to the right chest a few days ago.  EXAM: CT CHEST, ABDOMEN AND PELVIS WITHOUT CONTRAST  TECHNIQUE: Multidetector CT imaging of the chest, abdomen and pelvis was performed following the standard protocol without IV contrast.  COMPARISON:  CT ABD/PELV WO CM dated 06/02/2010; DG CHEST 1V PORT dated 03/15/2014; CT CHEST W/O CM dated 01/02/2008; DG CHEST 2 VIEW dated 12/11/2007  FINDINGS: CT CHEST FINDINGS  Visualized thyroid is unremarkable. No enlarged axillary, mediastinal or hilar lymphadenopathy. Normal heart size. No pericardial effusion. Coronary arterial  calcifications. Status post median sternotomy and CABG procedure.  Central airways are patent. Multiple bilateral pulmonary nodules are demonstrated. May of these nodules were present on prior chest CT 01/02/2008. There are a few new pulmonary nodules visualized and referenced as follows: 3 mm left upper lobe nodule (image 22; series 3); 6 mm left upper lobe pulmonary nodule (image 14; series 3). Extensive centrilobular and paraseptal emphysematous change. Dependent atelectasis within the right greater than left lower lobes. There is a very small paramediastinal pneumothorax within the right hemi thorax (image 28; series 2).  CT ABDOMEN AND PELVIS FINDINGS  Lack of intravenous contrast material limits evaluation of the solid organ parenchyma. Liver is diffusely low in attenuation compatible with hepatic steatosis. Spleen, pancreas and bilateral adrenal glands are unremarkable. Kidneys are symmetric in size. No hydronephrosis.  Normal caliber abdominal aorta with scattered calcified atherosclerotic plaque. Urinary bladder is unremarkable. Prostate is unremarkable.  No abnormal bowel wall thickening. No evidence for bowel obstruction. No free fluid or free intraperitoneal air. Normal appendix.  IMPRESSION: 1. Tiny right paramediastinal pneumothorax.  2. Multiple bilateral pulmonary nodules, many of which are stable since 2009. There are a few new pulmonary nodules as detailed above common with the largest measuring 6 mm. If the patient is at high risk for bronchogenic carcinoma, follow-up chest CT at 6-12 months is recommended. If the patient is at low risk for bronchogenic carcinoma, follow-up chest CT at 12 months is recommended. This recommendation follows the consensus statement: Guidelines for Management of Small Pulmonary Nodules Detected on CT Scans: A Statement from the [place] as published in Radiology 2005;237:395-400. 3. Hepatic steatosis Critical Value/emergent results were called by telephone at  the time of interpretation on 03/15/2014 at 8:10 PM to Dr. Ezequiel Essex , who verbally acknowledged these results.   Electronically Signed   By: Lovey Newcomer M.D.   On: 03/15/2014 20:13   Ct Chest Wo Contrast  03/15/2014   CLINICAL DATA:  Patient status post trauma to the right chest a few days ago.  EXAM: CT CHEST, ABDOMEN AND PELVIS WITHOUT CONTRAST  TECHNIQUE: Multidetector CT imaging of the chest, abdomen and pelvis was performed following the standard protocol without IV contrast.  COMPARISON:  CT ABD/PELV WO CM dated 06/02/2010; DG CHEST 1V PORT dated 03/15/2014; CT CHEST W/O CM dated 01/02/2008; DG CHEST 2 VIEW dated 12/11/2007  FINDINGS: CT CHEST FINDINGS  Visualized thyroid is unremarkable. No enlarged axillary, mediastinal or hilar lymphadenopathy. Normal heart size. No pericardial effusion. Coronary arterial calcifications. Status post median sternotomy and CABG procedure.  Central airways are patent. Multiple bilateral pulmonary nodules are demonstrated. May of these nodules were present on prior chest CT 01/02/2008. There are a few new pulmonary nodules visualized and referenced as follows: 3 mm left upper lobe nodule (image 22; series 3); 6 mm left upper lobe pulmonary nodule (image 14; series 3). Extensive centrilobular and paraseptal emphysematous change. Dependent atelectasis within the right greater than left lower lobes. There is a very small paramediastinal pneumothorax within the right hemi thorax (image 28; series 2).  CT ABDOMEN AND PELVIS FINDINGS  Lack of intravenous contrast material limits evaluation of the solid organ parenchyma. Liver is diffusely low in attenuation compatible with hepatic steatosis. Spleen, pancreas and bilateral adrenal glands are unremarkable. Kidneys are symmetric in size. No hydronephrosis.  Normal caliber abdominal aorta with scattered calcified atherosclerotic plaque. Urinary bladder is unremarkable. Prostate is unremarkable.  No abnormal bowel wall thickening. No  evidence for bowel obstruction. No free fluid or free intraperitoneal air. Normal appendix.  IMPRESSION: 1. Tiny right paramediastinal pneumothorax. 2. Multiple bilateral pulmonary nodules, many of which are stable since 2009. There are a few new pulmonary nodules as detailed above common with the largest measuring 6 mm. If the patient is at high risk for bronchogenic carcinoma, follow-up chest CT at 6-12 months is recommended. If the patient is at low risk for bronchogenic carcinoma, follow-up chest CT at 12 months is recommended. This recommendation follows the consensus statement: Guidelines for Management of Small Pulmonary Nodules Detected on CT Scans: A Statement from the [place] as published in Radiology 2005;237:395-400. 3. Hepatic steatosis Critical Value/emergent results were called by telephone at the time of interpretation on 03/15/2014 at 8:10 PM to Dr. Ezequiel Essex , who verbally acknowledged these results.   Electronically Signed   By: Lovey Newcomer M.D.   On: 03/15/2014 20:13   Dg Chest Portable 1 View  03/15/2014   CLINICAL DATA:  Shortness of breath and chest pain  EXAM: PORTABLE CHEST - 1 VIEW  COMPARISON:  May 14, 2013  FINDINGS: There is underlying emphysematous change. There is no edema or consolidation. Heart is upper normal in size with normal pulmonary vascularity. Patient is status post median sternotomy ; multiple sternal wires are fractured, a finding noted previously. No adenopathy. No pneumothorax. No bone lesions.  IMPRESSION: Underlying emphysematous change.  No edema or consolidation.   Electronically Signed   By: Lowella Grip M.D.   On: 03/15/2014 18:22    Review of Systems  Unable to perform ROS Respiratory: Positive for cough (chronic).   All other systems reviewed and are negative.  Blood pressure 100/69, pulse 73, temperature 97.5 F (36.4 C), temperature source Oral, resp. rate 20, height _0  (1.778 m), weight 311 lb 3.2 oz (141.159 kg), SpO2  93.00%. Physical Exam  Constitutional: He is oriented to person, place, and time. He appears well-developed and well-nourished. He appears distressed (looks uncomfortable).  HENT:  Head: Normocephalic and atraumatic.  Right Ear: External ear normal.  Left Ear: External ear normal.  Eyes: Conjunctivae are normal. Pupils are equal, round, and reactive to light. Right eye exhibits no discharge. Left eye exhibits no discharge. No scleral icterus.  Neck: Normal range of motion. Neck supple. No tracheal deviation present. No thyromegaly present.  Cardiovascular: Normal rate, regular rhythm, normal heart sounds and intact distal pulses.   Respiratory: Effort normal and breath sounds normal. No respiratory distress. He exhibits tenderness (right left lower chest).  GI: Soft. Bowel sounds are normal. He exhibits no distension and no mass. There is no tenderness. There is no rebound and no guarding.  Lymphadenopathy:    He has no cervical adenopathy.  Neurological: He is alert and oriented to person, place, and time. Coordination normal.  Skin: Skin is warm and dry. No rash noted. He is not diaphoretic. No erythema. No pallor.  Psychiatric: He has a normal mood and affect. His behavior is normal. Judgment and thought content normal.    Assessment/Plan: Occult right pneumothorax.   Chest wall contusion.  Repeat CXR this AM.   Pain control Pulmonary toilet/incentive spirometry.  Given delay in presentation, he is unlikely to have problems with PTX.    [name] 03/16/2014, 6:52 AM

## 2014-03-16 NOTE — Discharge Instructions (Signed)
Please discuss with Purvis Kilts, MD when to get a follow up chest CT to evaluate nodules noted on chest CT this admission.

## 2014-03-16 NOTE — Discharge Summary (Signed)
Personally seen and examined. Agree with above. No room currently for addition of calcium channel blocker as additional antianginal.  Doing well.  Small PTX right. Surgery seen.  Incentive spirometry.  Follow up with Dr. Donata Clay.

## 2014-03-16 NOTE — Progress Notes (Signed)
Subjective:  Sitting up, feeling better. I take care of his wife. He fell while trying to move a heavy log. Please see H&P for full details.  Objective:  Vital Signs in the last 24 hours: Temp:  [97.5 F (36.4 C)-98.5 F (36.9 C)] 97.5 F (36.4 C) (05/16 0514) Pulse Rate:  [73-81] 73 (05/16 0514) Resp:  [18-28] 20 (05/16 0514) BP: (100-144)/(68-96) 100/69 mmHg (05/16 0514) SpO2:  [90 %-100 %] 93 % (05/16 0514) Weight:  [311 lb (141.069 kg)-311 lb 3.2 oz (141.159 kg)] 311 lb 3.2 oz (141.159 kg) (05/16 0514)  Intake/Output from previous day:     Physical Exam: General: Well developed, well nourished, in no acute distress. Head:  Normocephalic and atraumatic. Lungs: Clear to auscultation and percussion. Heart: Normal S1 and S2.  No murmur, rubs or gallops.  Abdomen: soft, non-tender, positive bowel sounds. Obese Extremities: No clubbing or cyanosis. Chronic trace lower extremity edema. Neurologic: Alert and oriented x 3.    Lab Results:  Recent Labs  03/15/14 1805 03/16/14 0030  WBC 11.1* 10.0  HGB 16.2 15.2  PLT 272 243    Recent Labs  03/15/14 1805 03/16/14 0030 03/16/14 0400  NA 138  --  141  K 3.7  --  3.4*  CL 100  --  104  CO2 23  --  25  GLUCOSE 120*  --  138*  BUN 9  --  11  CREATININE 1.13 1.12 1.15    Recent Labs  03/15/14 1805 03/16/14 0400  TROPONINI <0.30 <0.30   Hepatic Function Panel  Recent Labs  03/15/14 1805  PROT 7.0  ALBUMIN 3.6  AST 32  ALT 54*  ALKPHOS 78  BILITOT 0.4   No results found for this basename: CHOL,  in the last 72 hours No results found for this basename: PROTIME,  in the last 72 hours  Imaging: Ct Abdomen Pelvis Wo Contrast  03/15/2014   CLINICAL DATA:  Patient status post trauma to the right chest a few days ago.  EXAM: CT CHEST, ABDOMEN AND PELVIS WITHOUT CONTRAST  TECHNIQUE: Multidetector CT imaging of the chest, abdomen and pelvis was performed following the standard protocol without IV contrast.   COMPARISON:  CT ABD/PELV WO CM dated 06/02/2010; DG CHEST 1V PORT dated 03/15/2014; CT CHEST W/O CM dated 01/02/2008; DG CHEST 2 VIEW dated 12/11/2007  FINDINGS: CT CHEST FINDINGS  Visualized thyroid is unremarkable. No enlarged axillary, mediastinal or hilar lymphadenopathy. Normal heart size. No pericardial effusion. Coronary arterial calcifications. Status post median sternotomy and CABG procedure.  Central airways are patent. Multiple bilateral pulmonary nodules are demonstrated. May of these nodules were present on prior chest CT 01/02/2008. There are a few new pulmonary nodules visualized and referenced as follows: 3 mm left upper lobe nodule (image 22; series 3); 6 mm left upper lobe pulmonary nodule (image 14; series 3). Extensive centrilobular and paraseptal emphysematous change. Dependent atelectasis within the right greater than left lower lobes. There is a very small paramediastinal pneumothorax within the right hemi thorax (image 28; series 2).  CT ABDOMEN AND PELVIS FINDINGS  Lack of intravenous contrast material limits evaluation of the solid organ parenchyma. Liver is diffusely low in attenuation compatible with hepatic steatosis. Spleen, pancreas and bilateral adrenal glands are unremarkable. Kidneys are symmetric in size. No hydronephrosis.  Normal caliber abdominal aorta with scattered calcified atherosclerotic plaque. Urinary bladder is unremarkable. Prostate is unremarkable.  No abnormal bowel wall thickening. No evidence for bowel obstruction. No free fluid  or free intraperitoneal air. Normal appendix.  IMPRESSION: 1. Tiny right paramediastinal pneumothorax. 2. Multiple bilateral pulmonary nodules, many of which are stable since 2009. There are a few new pulmonary nodules as detailed above common with the largest measuring 6 mm. If the patient is at high risk for bronchogenic carcinoma, follow-up chest CT at 6-12 months is recommended. If the patient is at low risk for bronchogenic carcinoma,  follow-up chest CT at 12 months is recommended. This recommendation follows the consensus statement: Guidelines for Management of Small Pulmonary Nodules Detected on CT Scans: A Statement from the [place] as published in Radiology 2005;237:395-400. 3. Hepatic steatosis Critical Value/emergent results were called by telephone at the time of interpretation on 03/15/2014 at 8:10 PM to Dr. Ezequiel Essex , who verbally acknowledged these results.   Electronically Signed   By: Lovey Newcomer M.D.   On: 03/15/2014 20:13   Ct Chest Wo Contrast  03/15/2014   CLINICAL DATA:  Patient status post trauma to the right chest a few days ago.  EXAM: CT CHEST, ABDOMEN AND PELVIS WITHOUT CONTRAST  TECHNIQUE: Multidetector CT imaging of the chest, abdomen and pelvis was performed following the standard protocol without IV contrast.  COMPARISON:  CT ABD/PELV WO CM dated 06/02/2010; DG CHEST 1V PORT dated 03/15/2014; CT CHEST W/O CM dated 01/02/2008; DG CHEST 2 VIEW dated 12/11/2007  FINDINGS: CT CHEST FINDINGS  Visualized thyroid is unremarkable. No enlarged axillary, mediastinal or hilar lymphadenopathy. Normal heart size. No pericardial effusion. Coronary arterial calcifications. Status post median sternotomy and CABG procedure.  Central airways are patent. Multiple bilateral pulmonary nodules are demonstrated. May of these nodules were present on prior chest CT 01/02/2008. There are a few new pulmonary nodules visualized and referenced as follows: 3 mm left upper lobe nodule (image 22; series 3); 6 mm left upper lobe pulmonary nodule (image 14; series 3). Extensive centrilobular and paraseptal emphysematous change. Dependent atelectasis within the right greater than left lower lobes. There is a very small paramediastinal pneumothorax within the right hemi thorax (image 28; series 2).  CT ABDOMEN AND PELVIS FINDINGS  Lack of intravenous contrast material limits evaluation of the solid organ parenchyma. Liver is diffusely low in  attenuation compatible with hepatic steatosis. Spleen, pancreas and bilateral adrenal glands are unremarkable. Kidneys are symmetric in size. No hydronephrosis.  Normal caliber abdominal aorta with scattered calcified atherosclerotic plaque. Urinary bladder is unremarkable. Prostate is unremarkable.  No abnormal bowel wall thickening. No evidence for bowel obstruction. No free fluid or free intraperitoneal air. Normal appendix.  IMPRESSION: 1. Tiny right paramediastinal pneumothorax. 2. Multiple bilateral pulmonary nodules, many of which are stable since 2009. There are a few new pulmonary nodules as detailed above common with the largest measuring 6 mm. If the patient is at high risk for bronchogenic carcinoma, follow-up chest CT at 6-12 months is recommended. If the patient is at low risk for bronchogenic carcinoma, follow-up chest CT at 12 months is recommended. This recommendation follows the consensus statement: Guidelines for Management of Small Pulmonary Nodules Detected on CT Scans: A Statement from the [place] as published in Radiology 2005;237:395-400. 3. Hepatic steatosis Critical Value/emergent results were called by telephone at the time of interpretation on 03/15/2014 at 8:10 PM to Dr. Ezequiel Essex , who verbally acknowledged these results.   Electronically Signed   By: Lovey Newcomer M.D.   On: 03/15/2014 20:13   Dg Chest Portable 1 View  03/15/2014   CLINICAL DATA:  Shortness of breath and  chest pain  EXAM: PORTABLE CHEST - 1 VIEW  COMPARISON:  May 14, 2013  FINDINGS: There is underlying emphysematous change. There is no edema or consolidation. Heart is upper normal in size with normal pulmonary vascularity. Patient is status post median sternotomy ; multiple sternal wires are fractured, a finding noted previously. No adenopathy. No pneumothorax. No bone lesions.  IMPRESSION: Underlying emphysematous change.  No edema or consolidation.   Electronically Signed   By: Lowella Grip  M.D.   On: 03/15/2014 18:22   Personally viewed.   Telemetry: No adverse arrhythmias Personally viewed.   EKG:  Sinus rhythm, QTC 471, nonspecific ST-T wave changes  Assessment/Plan:  53 y.o. male w/ PMHx significant for CAD s/p CABG, PAF, HTN, COPD who presented to [facility] on 03/15/2014 with complaints of R wall chest after a fall while working in his yard with trauma to his right chest wall.   1. chest pain-appreciate surgical consultation. Small right-sided pneumothorax. Continue with incentive spirometry. I have ordered. Troponin normal. He is feeling better this morning. He is on multiple antianginal medications. Blood pressure currently 433 systolic. Unable to add calcium channel blocker at this time. Continue with current regimen. He is eager to go home. I'm comfortable with this. He is seeing no [facility] cardiology Dr. Donata Clay. He will obtain a followup with him.  2. Obesity-encourage weight loss.  3. Coronary artery disease status post bypass. Extensive cardiac history. Has even been reviewed by [facility] for possible heart transplant?  4. Small right pneumothorax. Conservative management. Pulmonary toilet.  Okay with discharge.  Candee Furbish 03/16/2014, 9:53 AM

## 2014-03-16 NOTE — Discharge Summary (Signed)
Discharge Summary   Patient ID: [name], MRN: [id number], DOB/AGE: 02/23/1961 53 y.o.  Admit date: 03/15/2014 Discharge date: 03/16/2014   Primary Care Physician:  Sharilyn Sites CABOT   Primary Cardiologist:  Dr. Idelia Salm at [facility])   Reason for Admission:  Chest Pain   Primary Discharge Diagnoses:  Principal Problem:   Chest pain Active Problems:   HYPERLIPIDEMIA   HYPERTENSION   CORONARY ARTERY DISEASE   PAROXYSMAL ATRIAL FIBRILLATION   Angina, class III   Pneumothorax on right   Lung nodule     Wt Readings from Last 3 Encounters:  03/16/14 311 lb 3.2 oz (141.159 kg)  08/30/13 311 lb 9.6 oz (141.341 kg)  03/14/13 300 lb (136.079 kg)    Secondary Discharge Diagnoses:   Past Medical History  Diagnosis Date  . CAD (coronary artery disease) 2008    bypass grafting   . Morbid obesity   . PAF (paroxysmal atrial fibrillation)   . HTN (hypertension)   . COPD (chronic obstructive pulmonary disease)   . Hyperlipidemia   . Sleep apnea   . S/P colonoscopy 2009    3-4 mm transverse colon erosions likely secondary to  ASA  . S/P endoscopy Dec 2011    moderate erosive gastritis, Barrett's esophagus 1-2cm  . Myocardial infarction 2008,2009,2009  . Chronic kidney disease     hx of kidney stones  . GERD (gastroesophageal reflux disease)   . Barrett's esophagus   . Depression   . Glaucoma   . SOB (shortness of breath) 11/03/2007    2D Echo EF 50%-55%  . Abnormal myocardial perfusion study 01/01/2011    there a small to moderate sized inferobasal scar  . OSA (obstructive sleep apnea)     on cpap  . Diabetes     type 2 diabetes mellitus  . Claudication 11/16/2011    PV test perform shows normal  . Cataracts, bilateral   . Dysrhythmia   . Hernia of abdominal wall       Allergies:    Allergies  Allergen Reactions  . Contrast Media [Iodinated Diagnostic Agents]   . Iohexol      Desc: PT. STATES HEART STOPPED HAS TO BE  PREMED.       Procedures Performed This Admission:   None   Hospital Course:  [name] is a 53 y.o. male with a hx of CAD s/p CABG, chronic angina, PAF, HTN, COPD.  He presented to [facility] on the day of admission with R chest wall pain after falling on the previous day while doing yard work.  Patient also noted 1 month of worsening angina.  There was some concern for a STEMI but ECGs were reviewed and felt to be stable when compared to previous tracings.  Initial troponin was normal.  Patient noted to have hx of severe angina with no good targets for PCI based upon prior cath films.  Of note, he has a hx of 2nd opinion at [facility] obtained with suggestion for cardiac transplant.  During evaluation in ED, Chest CT demonstrated small posterior pneumothorax.  He was seen by surgery for his pneumothorax.  Pain control, pulmonary toilet with IS recommended.  Regarding his Class 3-4 angina, his antianginal regimen was noted to be extensive.  CEs remained negative.  Nitrates were adjusted. BP was soft this AM and not felt to be able to tolerate addition of CCB.  He was seen by Dr. Candee Furbish and felt to be stable  for d/c to home.  He will need close follow up with his cardiologist at [facility].    Of note, patient had some scattered pulmonary nodules on chest CT.  These would need to be followed with repeat CT in 6-12 months.  Patient was asked to follow up with his PCP to arrange.   Discharge Vitals:   Blood pressure 100/69, pulse 73, temperature 97.5 F (36.4 C), temperature source Oral, resp. rate 20, height 5\' 10"  (1.778 m), weight 311 lb 3.2 oz (141.159 kg), SpO2 93.00%.   Labs:   Recent Labs  03/15/14 1805 03/16/14 0030  WBC 11.1* 10.0  HGB 16.2 15.2  HCT 45.6 44.6  MCV 93.3 95.5  PLT 272 243     Recent Labs  03/15/14 1805 03/16/14 0030 03/16/14 0400  NA 138  --  141  K 3.7  --  3.4*  CL 100  --  104  CO2 23  --  25  BUN 9  --  11  CREATININE 1.13 1.12 1.15    CALCIUM 9.3  --  8.9  PROT 7.0  --   --   BILITOT 0.4  --   --   ALKPHOS 78  --   --   ALT 54*  --   --   AST 32  --   --      Recent Labs  03/15/14 1805 03/16/14 0400  TROPONINI <0.30 <0.30      Recent Labs  03/15/14 1805  INR 1.06     Diagnostic Procedures and Studies:  Ct Abdomen Pelvis Wo Contrast  03/15/2014    IMPRESSION: 1. Tiny right paramediastinal pneumothorax. 2. Multiple bilateral pulmonary nodules, many of which are stable since 2009. There are a few new pulmonary nodules as detailed above common with the largest measuring 6 mm. If the patient is at high risk for bronchogenic carcinoma, follow-up chest CT at 6-12 months is recommended. If the patient is at low risk for bronchogenic carcinoma, follow-up chest CT at 12 months is recommended. This recommendation follows the consensus statement: Guidelines for Management of Small Pulmonary Nodules Detected on CT Scans: A Statement from the [place] as published in Radiology 2005;237:395-400. 3. Hepatic steatosis Critical Value/emergent results were called by telephone at the time of interpretation on 03/15/2014 at 8:10 PM to Dr. Ezequiel Essex , who verbally acknowledged these results.   Electronically Signed   By: Lovey Newcomer M.D.   On: 03/15/2014 20:13   Ct Chest Wo Contrast  03/15/2014     IMPRESSION: 1. Tiny right paramediastinal pneumothorax. 2. Multiple bilateral pulmonary nodules, many of which are stable since 2009. There are a few new pulmonary nodules as detailed above common with the largest measuring 6 mm. If the patient is at high risk for bronchogenic carcinoma, follow-up chest CT at 6-12 months is recommended. If the patient is at low risk for bronchogenic carcinoma, follow-up chest CT at 12 months is recommended. This recommendation follows the consensus statement: Guidelines for Management of Small Pulmonary Nodules Detected on CT Scans: A Statement from the [place] as published in Radiology  2005;237:395-400. 3. Hepatic steatosis Critical Value/emergent results were called by telephone at the time of interpretation on 03/15/2014 at 8:10 PM to Dr. Ezequiel Essex , who verbally acknowledged these results.   Electronically Signed   By: Lovey Newcomer M.D.   On: 03/15/2014 20:13   Dg Chest Portable 1 View  03/15/2014      IMPRESSION: Underlying emphysematous change.  No  edema or consolidation.   Electronically Signed   By: Lowella Grip M.D.   On: 03/15/2014 18:22    Disposition:   Pt is being discharged home today in good condition.  Follow-up Plans & Appointments      Follow-up Information   Follow up with Idelia Salm, MD In 1 week. (please call to arrange follow up appt)    Specialty:  Internal Medicine   Contact information:   [place] [id number] [phone]       Follow up with Purvis Kilts, MD In 1 week. (please call - you need to arrange a chest CT with Dr. Hilma Favors in 6-12 months)    Specialty:  Family Medicine   Contact information:   [address] [place]  [address] [phone]       Discharge Medications    Medication List         aspirin EC 81 MG tablet  Take 81 mg by mouth 2 (two) times daily.     diazepam 5 MG tablet  Commonly known as:  VALIUM  Take 5 mg by mouth 2 (two) times daily.     Fenofibric Acid 135 MG Cpdr  Take 135 mg by mouth at bedtime.     furosemide 40 MG tablet  Commonly known as:  LASIX  Take 80 mg by mouth every morning.     isosorbide mononitrate 60 MG 24 hr tablet  Commonly known as:  IMDUR  Take 1 tablet (60 mg total) by mouth 3 (three) times daily.     lisinopril 5 MG tablet  Commonly known as:  PRINIVIL,ZESTRIL  Take 5 mg by mouth every morning.     metoprolol 100 MG tablet  Commonly known as:  LOPRESSOR  Take 100 mg by mouth 2 (two) times daily.     nitroGLYCERIN 0.4 MG/SPRAY spray  Commonly known as:  NITROLINGUAL  Place 1 spray under the tongue  every 5 (five) minutes as needed for chest pain.     pantoprazole 40 MG tablet  Commonly known as:  PROTONIX  Take 40 mg by mouth every morning.     potassium chloride SA 20 MEQ tablet  Commonly known as:  K-DUR,KLOR-CON  Take 1 tablet (20 mEq total) by mouth 2 (two) times daily.     prasugrel 10 MG Tabs tablet  Commonly known as:  EFFIENT  Take 10 mg by mouth every morning.     ranolazine 1000 MG SR tablet  Commonly known as:  RANEXA  Take 1 tablet (1,000 mg total) by mouth 2 (two) times daily.     rosuvastatin 20 MG tablet  Commonly known as:  CRESTOR  Take 20 mg by mouth every morning.     sitaGLIPtin 100 MG tablet  Commonly known as:  JANUVIA  Take 100 mg by mouth every morning.         Outstanding Labs/Studies  1. F/u Chest CT in 6-12 months.  Needs to be arranged with primary care physician.   Duration of Discharge Encounter: Greater than 30 minutes including physician and PA time.  Signed, Richardson Dopp, PA-C   03/16/2014 11:38 AM

## 2014-03-16 NOTE — Progress Notes (Signed)
Utilization review complete 

## 2014-03-19 ENCOUNTER — Other Ambulatory Visit: Payer: Self-pay | Admitting: Gastroenterology

## 2014-03-20 ENCOUNTER — Other Ambulatory Visit: Payer: Self-pay | Admitting: Cardiovascular Disease

## 2014-03-21 NOTE — Telephone Encounter (Signed)
Rx refill sent to patient pharmacy   

## 2014-03-26 DIAGNOSIS — J9383 Other pneumothorax: Secondary | ICD-10-CM | POA: Diagnosis not present

## 2014-03-26 DIAGNOSIS — R0602 Shortness of breath: Secondary | ICD-10-CM | POA: Diagnosis not present

## 2014-03-26 DIAGNOSIS — Z951 Presence of aortocoronary bypass graft: Secondary | ICD-10-CM | POA: Diagnosis not present

## 2014-03-26 DIAGNOSIS — Z9861 Coronary angioplasty status: Secondary | ICD-10-CM | POA: Diagnosis not present

## 2014-03-27 DIAGNOSIS — J9383 Other pneumothorax: Secondary | ICD-10-CM | POA: Diagnosis not present

## 2014-03-27 DIAGNOSIS — Z6841 Body Mass Index (BMI) 40.0 and over, adult: Secondary | ICD-10-CM | POA: Diagnosis not present

## 2014-03-27 DIAGNOSIS — I2510 Atherosclerotic heart disease of native coronary artery without angina pectoris: Secondary | ICD-10-CM | POA: Diagnosis not present

## 2014-03-27 DIAGNOSIS — J984 Other disorders of lung: Secondary | ICD-10-CM | POA: Diagnosis not present

## 2014-04-02 DIAGNOSIS — Z951 Presence of aortocoronary bypass graft: Secondary | ICD-10-CM | POA: Diagnosis not present

## 2014-04-02 DIAGNOSIS — R0602 Shortness of breath: Secondary | ICD-10-CM | POA: Diagnosis not present

## 2014-04-02 DIAGNOSIS — I2510 Atherosclerotic heart disease of native coronary artery without angina pectoris: Secondary | ICD-10-CM | POA: Diagnosis not present

## 2014-04-08 DIAGNOSIS — Z6841 Body Mass Index (BMI) 40.0 and over, adult: Secondary | ICD-10-CM | POA: Diagnosis not present

## 2014-04-08 DIAGNOSIS — E119 Type 2 diabetes mellitus without complications: Secondary | ICD-10-CM | POA: Diagnosis not present

## 2014-04-08 DIAGNOSIS — I10 Essential (primary) hypertension: Secondary | ICD-10-CM | POA: Diagnosis not present

## 2014-04-08 DIAGNOSIS — E785 Hyperlipidemia, unspecified: Secondary | ICD-10-CM | POA: Diagnosis not present

## 2014-04-15 DIAGNOSIS — R0602 Shortness of breath: Secondary | ICD-10-CM | POA: Diagnosis not present

## 2014-04-15 DIAGNOSIS — Z951 Presence of aortocoronary bypass graft: Secondary | ICD-10-CM | POA: Diagnosis not present

## 2014-04-16 ENCOUNTER — Other Ambulatory Visit: Payer: Self-pay | Admitting: Cardiovascular Disease

## 2014-04-17 DIAGNOSIS — I259 Chronic ischemic heart disease, unspecified: Secondary | ICD-10-CM | POA: Diagnosis not present

## 2014-04-17 DIAGNOSIS — J984 Other disorders of lung: Secondary | ICD-10-CM | POA: Diagnosis not present

## 2014-04-17 DIAGNOSIS — J449 Chronic obstructive pulmonary disease, unspecified: Secondary | ICD-10-CM | POA: Diagnosis not present

## 2014-04-17 DIAGNOSIS — R0602 Shortness of breath: Secondary | ICD-10-CM | POA: Diagnosis not present

## 2014-04-27 ENCOUNTER — Emergency Department (HOSPITAL_COMMUNITY): Payer: BC Managed Care – PPO

## 2014-04-27 ENCOUNTER — Inpatient Hospital Stay (HOSPITAL_COMMUNITY)
Admission: EM | Admit: 2014-04-27 | Discharge: 2014-04-29 | DRG: 069 | Disposition: A | Discharge status: Home / Self Care | Payer: BC Managed Care – PPO | Attending: Internal Medicine | Admitting: Internal Medicine

## 2014-04-27 ENCOUNTER — Encounter (HOSPITAL_COMMUNITY): Payer: Self-pay | Admitting: Emergency Medicine

## 2014-04-27 DIAGNOSIS — J4489 Other specified chronic obstructive pulmonary disease: Secondary | ICD-10-CM | POA: Diagnosis present

## 2014-04-27 DIAGNOSIS — R4182 Altered mental status, unspecified: Secondary | ICD-10-CM | POA: Diagnosis not present

## 2014-04-27 DIAGNOSIS — I219 Acute myocardial infarction, unspecified: Secondary | ICD-10-CM

## 2014-04-27 DIAGNOSIS — E785 Hyperlipidemia, unspecified: Secondary | ICD-10-CM

## 2014-04-27 DIAGNOSIS — K921 Melena: Secondary | ICD-10-CM

## 2014-04-27 DIAGNOSIS — Z951 Presence of aortocoronary bypass graft: Secondary | ICD-10-CM | POA: Diagnosis not present

## 2014-04-27 DIAGNOSIS — E669 Obesity, unspecified: Secondary | ICD-10-CM | POA: Diagnosis present

## 2014-04-27 DIAGNOSIS — Z9861 Coronary angioplasty status: Secondary | ICD-10-CM

## 2014-04-27 DIAGNOSIS — I2510 Atherosclerotic heart disease of native coronary artery without angina pectoris: Secondary | ICD-10-CM | POA: Diagnosis present

## 2014-04-27 DIAGNOSIS — E782 Mixed hyperlipidemia: Secondary | ICD-10-CM | POA: Diagnosis present

## 2014-04-27 DIAGNOSIS — F32A Depression, unspecified: Secondary | ICD-10-CM | POA: Diagnosis present

## 2014-04-27 DIAGNOSIS — I209 Angina pectoris, unspecified: Secondary | ICD-10-CM

## 2014-04-27 DIAGNOSIS — Z888 Allergy status to other drugs, medicaments and biological substances status: Secondary | ICD-10-CM

## 2014-04-27 DIAGNOSIS — K219 Gastro-esophageal reflux disease without esophagitis: Secondary | ICD-10-CM | POA: Diagnosis present

## 2014-04-27 DIAGNOSIS — I129 Hypertensive chronic kidney disease with stage 1 through stage 4 chronic kidney disease, or unspecified chronic kidney disease: Secondary | ICD-10-CM | POA: Diagnosis present

## 2014-04-27 DIAGNOSIS — Z720 Tobacco use: Secondary | ICD-10-CM

## 2014-04-27 DIAGNOSIS — G4733 Obstructive sleep apnea (adult) (pediatric): Secondary | ICD-10-CM | POA: Diagnosis not present

## 2014-04-27 DIAGNOSIS — Z91041 Radiographic dye allergy status: Secondary | ICD-10-CM

## 2014-04-27 DIAGNOSIS — R319 Hematuria, unspecified: Secondary | ICD-10-CM

## 2014-04-27 DIAGNOSIS — I252 Old myocardial infarction: Secondary | ICD-10-CM | POA: Diagnosis not present

## 2014-04-27 DIAGNOSIS — H409 Unspecified glaucoma: Secondary | ICD-10-CM | POA: Diagnosis present

## 2014-04-27 DIAGNOSIS — N189 Chronic kidney disease, unspecified: Secondary | ICD-10-CM | POA: Diagnosis present

## 2014-04-27 DIAGNOSIS — R4789 Other speech disturbances: Secondary | ICD-10-CM | POA: Diagnosis not present

## 2014-04-27 DIAGNOSIS — G459 Transient cerebral ischemic attack, unspecified: Principal | ICD-10-CM | POA: Diagnosis present

## 2014-04-27 DIAGNOSIS — I10 Essential (primary) hypertension: Secondary | ICD-10-CM | POA: Diagnosis not present

## 2014-04-27 DIAGNOSIS — I4891 Unspecified atrial fibrillation: Secondary | ICD-10-CM | POA: Diagnosis present

## 2014-04-27 DIAGNOSIS — Z7982 Long term (current) use of aspirin: Secondary | ICD-10-CM

## 2014-04-27 DIAGNOSIS — G2581 Restless legs syndrome: Secondary | ICD-10-CM

## 2014-04-27 DIAGNOSIS — K2270 Barrett's esophagus without dysplasia: Secondary | ICD-10-CM | POA: Diagnosis present

## 2014-04-27 DIAGNOSIS — R911 Solitary pulmonary nodule: Secondary | ICD-10-CM

## 2014-04-27 DIAGNOSIS — J939 Pneumothorax, unspecified: Secondary | ICD-10-CM

## 2014-04-27 DIAGNOSIS — F3289 Other specified depressive episodes: Secondary | ICD-10-CM | POA: Diagnosis present

## 2014-04-27 DIAGNOSIS — F1010 Alcohol abuse, uncomplicated: Secondary | ICD-10-CM | POA: Diagnosis present

## 2014-04-27 DIAGNOSIS — Z6841 Body Mass Index (BMI) 40.0 and over, adult: Secondary | ICD-10-CM

## 2014-04-27 DIAGNOSIS — R471 Dysarthria and anarthria: Secondary | ICD-10-CM | POA: Diagnosis present

## 2014-04-27 DIAGNOSIS — Z823 Family history of stroke: Secondary | ICD-10-CM

## 2014-04-27 DIAGNOSIS — R9439 Abnormal result of other cardiovascular function study: Secondary | ICD-10-CM

## 2014-04-27 DIAGNOSIS — R109 Unspecified abdominal pain: Secondary | ICD-10-CM

## 2014-04-27 DIAGNOSIS — E876 Hypokalemia: Secondary | ICD-10-CM | POA: Diagnosis present

## 2014-04-27 DIAGNOSIS — E119 Type 2 diabetes mellitus without complications: Secondary | ICD-10-CM

## 2014-04-27 DIAGNOSIS — J449 Chronic obstructive pulmonary disease, unspecified: Secondary | ICD-10-CM | POA: Diagnosis present

## 2014-04-27 DIAGNOSIS — R4701 Aphasia: Secondary | ICD-10-CM | POA: Diagnosis present

## 2014-04-27 DIAGNOSIS — I480 Paroxysmal atrial fibrillation: Secondary | ICD-10-CM | POA: Diagnosis present

## 2014-04-27 DIAGNOSIS — R1013 Epigastric pain: Secondary | ICD-10-CM

## 2014-04-27 DIAGNOSIS — I639 Cerebral infarction, unspecified: Secondary | ICD-10-CM | POA: Diagnosis present

## 2014-04-27 DIAGNOSIS — R4702 Dysphasia: Secondary | ICD-10-CM

## 2014-04-27 DIAGNOSIS — F329 Major depressive disorder, single episode, unspecified: Secondary | ICD-10-CM | POA: Diagnosis present

## 2014-04-27 DIAGNOSIS — J309 Allergic rhinitis, unspecified: Secondary | ICD-10-CM

## 2014-04-27 DIAGNOSIS — I200 Unstable angina: Secondary | ICD-10-CM

## 2014-04-27 DIAGNOSIS — F17200 Nicotine dependence, unspecified, uncomplicated: Secondary | ICD-10-CM | POA: Diagnosis present

## 2014-04-27 LAB — CBC WITH DIFFERENTIAL/PLATELET
Basophils Absolute: 0.1 10*3/uL (ref 0.0–0.1)
Basophils Relative: 1 % (ref 0–1)
EOS ABS: 0.5 10*3/uL (ref 0.0–0.7)
Eosinophils Relative: 4 % (ref 0–5)
HCT: 45.2 % (ref 39.0–52.0)
HEMOGLOBIN: 16.4 g/dL (ref 13.0–17.0)
LYMPHS ABS: 2.8 10*3/uL (ref 0.7–4.0)
LYMPHS PCT: 21 % (ref 12–46)
MCH: 33.7 pg (ref 26.0–34.0)
MCHC: 36.3 g/dL — ABNORMAL HIGH (ref 30.0–36.0)
MCV: 92.8 fL (ref 78.0–100.0)
MONOS PCT: 8 % (ref 3–12)
Monocytes Absolute: 1.1 10*3/uL — ABNORMAL HIGH (ref 0.1–1.0)
NEUTROS ABS: 8.8 10*3/uL — AB (ref 1.7–7.7)
NEUTROS PCT: 66 % (ref 43–77)
PLATELETS: 277 10*3/uL (ref 150–400)
RBC: 4.87 MIL/uL (ref 4.22–5.81)
RDW: 12.8 % (ref 11.5–15.5)
WBC: 13.2 10*3/uL — AB (ref 4.0–10.5)

## 2014-04-27 LAB — COMPREHENSIVE METABOLIC PANEL
ALBUMIN: 3.6 g/dL (ref 3.5–5.2)
ALT: 52 U/L (ref 0–53)
AST: 34 U/L (ref 0–37)
Alkaline Phosphatase: 83 U/L (ref 39–117)
BUN: 7 mg/dL (ref 6–23)
CHLORIDE: 99 meq/L (ref 96–112)
CO2: 26 mEq/L (ref 19–32)
Calcium: 9.7 mg/dL (ref 8.4–10.5)
Creatinine, Ser: 1.24 mg/dL (ref 0.50–1.35)
GFR calc Af Amer: 76 mL/min — ABNORMAL LOW (ref >90)
GFR, EST NON AFRICAN AMERICAN: 66 mL/min — AB (ref >90)
GLUCOSE: 145 mg/dL — AB (ref 70–99)
POTASSIUM: 3.3 meq/L — AB (ref 3.7–5.3)
SODIUM: 140 meq/L (ref 137–147)
Total Bilirubin: 0.9 mg/dL (ref 0.3–1.2)
Total Protein: 7.1 g/dL (ref 6.0–8.3)

## 2014-04-27 LAB — TROPONIN I

## 2014-04-27 LAB — APTT: APTT: 30 s (ref 24–37)

## 2014-04-27 LAB — CBG MONITORING, ED: Glucose-Capillary: 144 mg/dL — ABNORMAL HIGH (ref 70–99)

## 2014-04-27 LAB — ETHANOL: Alcohol, Ethyl (B): <11 mg/dL (ref 0–11)

## 2014-04-27 MED ORDER — ISOSORBIDE MONONITRATE ER 60 MG PO TB24
60.0000 mg | ORAL_TABLET | Freq: Three times a day (TID) | ORAL | Status: DC
  Administered 2014-04-28 – 2014-04-29 (×4): 60 mg via ORAL
  Filled 2014-04-27 (×4): qty 1

## 2014-04-27 MED ORDER — ASPIRIN EC 81 MG PO TBEC: 81.0000 mg | DELAYED_RELEASE_TABLET | Freq: Two times a day (BID) | ORAL | Status: DC

## 2014-04-27 MED ORDER — THIAMINE HCL 100 MG/ML IJ SOLN: 100.0000 mg | Freq: Every day | INTRAMUSCULAR | Status: DC

## 2014-04-27 MED ORDER — FENOFIBRATE 160 MG PO TABS
160.0000 mg | ORAL_TABLET | Freq: Every day | ORAL | Status: DC
  Administered 2014-04-28 – 2014-04-29 (×2): 160 mg via ORAL
  Filled 2014-04-27 (×3): qty 1

## 2014-04-27 MED ORDER — RANOLAZINE ER 500 MG PO TB12: 1000.0000 mg | ORAL_TABLET | Freq: Two times a day (BID) | ORAL | Status: DC

## 2014-04-27 MED ORDER — LORAZEPAM 2 MG/ML IJ SOLN: 1.0000 mg | Freq: Four times a day (QID) | INTRAMUSCULAR | Status: DC | PRN

## 2014-04-27 MED ORDER — PRASUGREL HCL 10 MG PO TABS
10.0000 mg | ORAL_TABLET | Freq: Every morning | ORAL | Status: DC
  Administered 2014-04-28 – 2014-04-29 (×2): 10 mg via ORAL
  Filled 2014-04-27 (×2): qty 1

## 2014-04-27 MED ORDER — ATORVASTATIN CALCIUM 80 MG PO TABS
80.0000 mg | ORAL_TABLET | Freq: Every day | ORAL | Status: DC
  Administered 2014-04-28: 80 mg via ORAL
  Filled 2014-04-27: qty 1

## 2014-04-27 MED ORDER — LISINOPRIL 5 MG PO TABS
5.0000 mg | ORAL_TABLET | Freq: Every morning | ORAL | Status: DC
  Administered 2014-04-29: 5 mg via ORAL
  Filled 2014-04-27 (×2): qty 1

## 2014-04-27 MED ORDER — METOPROLOL TARTRATE 50 MG PO TABS: 100.0000 mg | ORAL_TABLET | Freq: Two times a day (BID) | ORAL | Status: DC

## 2014-04-27 MED ORDER — LORAZEPAM 1 MG PO TABS: 0.0000 mg | ORAL_TABLET | Freq: Four times a day (QID) | ORAL | Status: DC

## 2014-04-27 MED ORDER — PANTOPRAZOLE SODIUM 40 MG PO TBEC
40.0000 mg | DELAYED_RELEASE_TABLET | Freq: Every day | ORAL | Status: DC
  Administered 2014-04-28 – 2014-04-29 (×2): 40 mg via ORAL
  Filled 2014-04-27 (×2): qty 1

## 2014-04-27 MED ORDER — ENOXAPARIN SODIUM 40 MG/0.4ML ~~LOC~~ SOLN
40.0000 mg | Freq: Every day | SUBCUTANEOUS | Status: DC
  Administered 2014-04-28 – 2014-04-29 (×2): 40 mg via SUBCUTANEOUS
  Filled 2014-04-27 (×2): qty 0.4

## 2014-04-27 MED ORDER — LORAZEPAM 1 MG PO TABS: 0.0000 mg | ORAL_TABLET | Freq: Two times a day (BID) | ORAL | Status: DC

## 2014-04-27 MED ORDER — INSULIN ASPART 100 UNIT/ML ~~LOC~~ SOLN
0.0000 [IU] | SUBCUTANEOUS | Status: DC
  Administered 2014-04-28 – 2014-04-29 (×7): 2 [IU] via SUBCUTANEOUS

## 2014-04-27 MED ORDER — LINAGLIPTIN 5 MG PO TABS
5.0000 mg | ORAL_TABLET | Freq: Every day | ORAL | Status: DC
  Administered 2014-04-28 – 2014-04-29 (×2): 5 mg via ORAL
  Filled 2014-04-27 (×2): qty 1

## 2014-04-27 MED ORDER — POTASSIUM CHLORIDE CRYS ER 20 MEQ PO TBCR: 20.0000 meq | EXTENDED_RELEASE_TABLET | Freq: Two times a day (BID) | ORAL | Status: DC

## 2014-04-27 MED ORDER — ADULT MULTIVITAMIN W/MINERALS CH: 1.0000 | ORAL_TABLET | Freq: Every day | ORAL | Status: DC

## 2014-04-27 MED ORDER — SODIUM CHLORIDE 0.9 % IV BOLUS (SEPSIS)
500.0000 mL | Freq: Once | INTRAVENOUS | Status: AC
  Administered 2014-04-27: 20:00:00 via INTRAVENOUS

## 2014-04-27 MED ORDER — LORAZEPAM 1 MG PO TABS: 1.0000 mg | ORAL_TABLET | Freq: Four times a day (QID) | ORAL | Status: DC | PRN

## 2014-04-27 MED ORDER — FOLIC ACID 1 MG PO TABS: 1.0000 mg | ORAL_TABLET | Freq: Every day | ORAL | Status: DC

## 2014-04-27 MED ORDER — SODIUM CHLORIDE 0.9 % IV SOLN
100.0000 mL/h | INTRAVENOUS | Status: DC
  Administered 2014-04-27 – 2014-04-28 (×2): 100 mL/h via INTRAVENOUS

## 2014-04-27 MED ORDER — VITAMIN B-1 100 MG PO TABS
100.0000 mg | ORAL_TABLET | Freq: Every day | ORAL | Status: DC
  Administered 2014-04-28 – 2014-04-29 (×2): 100 mg via ORAL
  Filled 2014-04-27 (×2): qty 1

## 2014-04-27 NOTE — ED Provider Notes (Signed)
CSN: [id number]     Arrival date & time 04/27/14  1851 History   First MD Initiated Contact with Patient 04/27/14 1920     Chief Complaint  Patient presents with  . Altered Mental Status   HPI  Patient presents with new if these appear Symptoms began yesterday morning, approximately 36 hours. Of onset symptoms have been waxing/waning, becoming more present. Patient himself seems aware of the change, denies other changes. He denies pain, confusion, disorientation, nausea, vomiting. He states is compliant with all medication. He denies history of stroke.    Past Medical History  Diagnosis Date  . CAD (coronary artery disease) 2008    bypass grafting   . Morbid obesity   . PAF (paroxysmal atrial fibrillation)   . HTN (hypertension)   . COPD (chronic obstructive pulmonary disease)   . Hyperlipidemia   . Sleep apnea   . S/P colonoscopy 2009    3-4 mm transverse colon erosions likely secondary to  ASA  . S/P endoscopy Dec 2011    moderate erosive gastritis, Barrett's esophagus 1-2cm  . Myocardial infarction 2008,2009,2009  . Chronic kidney disease     hx of kidney stones  . GERD (gastroesophageal reflux disease)   . Barrett's esophagus   . Depression   . Glaucoma   . SOB (shortness of breath) 11/03/2007    2D Echo EF 50%-55%  . Abnormal myocardial perfusion study 01/01/2011    there a small to moderate sized inferobasal scar  . OSA (obstructive sleep apnea)     on cpap  . Diabetes     type 2 diabetes mellitus  . Claudication 11/16/2011    PV test perform shows normal  . Cataracts, bilateral   . Dysrhythmia   . Hernia of abdominal wall    Past Surgical History  Procedure Laterality Date  . Cabg x 4  03/2008  . Hernia repair      ventral hernia repair  . Coronary artery bypass graft  2008    4 vessels  . Cardiac catheterization  2009    stent placement to the left circumflex a 2.25   . Savory dilation  09/06/2011    Procedure: SAVORY DILATION;  Surgeon: Dorothyann Peng, MD;  Location: [facility];  Service: Endoscopy;  Laterality: N/A;  Dilated with 70mm  . Esophagogastroduodenoscopy  09/2011    Barrett's esophagus, no dysplasia on biopsies. Distal esophagitis. Status post dilation. Moderate gastritis and duodenitis, but biopsies benign. Next EGD in November 2015 for surveillance of Barrett's esophagus.  . Cardiac catheterization  07/08/2010  . Coronary stent placement  12/29/12  . Coronary stent placement  12/2012   Family History  Problem Relation Age of Onset  . Colon cancer Neg Hx   . Anesthesia problems Neg Hx   . Hypotension Neg Hx   . Malignant hyperthermia Neg Hx   . Pseudochol deficiency Neg Hx    History  Substance Use Topics  . Smoking status: Current Every Day Smoker -- 0.50 packs/day for 30 years    Types: Cigarettes  . Smokeless tobacco: Not on file  . Alcohol Use: Yes     Comment: every other day, one drink of beer or liquor    Review of Systems  Constitutional:       Per HPI, otherwise negative  HENT:       Per HPI, otherwise negative  Respiratory:       Per HPI, otherwise negative  Cardiovascular:       Per  HPI, otherwise negative  Gastrointestinal: Negative for vomiting.  Endocrine:       Negative aside from HPI  Genitourinary:       Neg aside from HPI   Musculoskeletal:       Per HPI, otherwise negative  Skin: Negative.   Neurological: Positive for speech difficulty. Negative for syncope.      Allergies  Contrast media and Iohexol  Home Medications   Prior to Admission medications   Medication Sig Start Date End Date Taking? Authorizing Provider  aspirin EC 81 MG tablet Take 81 mg by mouth 2 (two) times daily.   Yes Historical Provider, MD  Choline Fenofibrate (TRILIPIX) 135 MG capsule Take 135 mg by mouth daily.   Yes Historical Provider, MD  diazepam (VALIUM) 5 MG tablet Take 5 mg by mouth 2 (two) times daily.   Yes Historical Provider, MD  furosemide (LASIX) 40 MG tablet Take 80 mg by mouth every  morning.   Yes Historical Provider, MD  isosorbide mononitrate (IMDUR) 60 MG 24 hr tablet Take 1 tablet (60 mg total) by mouth 3 (three) times daily. 03/16/14  Yes Scott Joylene Draft, PA-C  lisinopril (PRINIVIL,ZESTRIL) 5 MG tablet Take 5 mg by mouth every morning.    Yes Historical Provider, MD  metoprolol (LOPRESSOR) 100 MG tablet Take 100 mg by mouth 2 (two) times daily.   Yes Historical Provider, MD  pantoprazole (PROTONIX) 40 MG tablet Take 40 mg by mouth daily. 03/19/14  Yes Historical Provider, MD  potassium chloride SA (K-DUR,KLOR-CON) 20 MEQ tablet Take 1 tablet (20 mEq total) by mouth 2 (two) times daily. 07/17/13  Yes Mihai Croitoru, MD  prasugrel (EFFIENT) 10 MG TABS Take 10 mg by mouth every morning.    Yes Historical Provider, MD  ranolazine (RANEXA) 1000 MG SR tablet Take 1 tablet (1,000 mg total) by mouth 2 (two) times daily. 07/17/13  Yes Mihai Croitoru, MD  sitaGLIPtin (JANUVIA) 100 MG tablet Take 100 mg by mouth every morning.    Yes Historical Provider, MD  escitalopram (LEXAPRO) 10 MG tablet Take 10 mg by mouth daily. 04/27/14   Historical Provider, MD  nitroGLYCERIN (NITROLINGUAL) 0.4 MG/SPRAY spray USE 1 SPRAY UNDER THE TONGUE EVERY 5 MINUTES AS NEEDED FOR CHEST PAIN    Mihai Croitoru, MD  rosuvastatin (CRESTOR) 20 MG tablet Take 1 tablet (20 mg total) by mouth daily. Needs appointment for further refills    Mihai Croitoru, MD   BP 114/76  Pulse 86  Temp(Src) 98.5 F (36.9 C) (Oral)  Resp 18  Ht 5\' 10"  (1.778 m)  Wt 301 lb (136.533 kg)  BMI 43.19 kg/m2  SpO2 95% Physical Exam  Nursing note and vitals reviewed. Constitutional: He is oriented to person, place, and time. He appears well-developed. No distress.  Obese, slightly diaphoretic male  HENT:  Head: Normocephalic and atraumatic.  Eyes: Conjunctivae and EOM are normal.  Cardiovascular: Normal rate and regular rhythm.   Pulmonary/Chest: Effort normal. No stridor. No respiratory distress.  Abdominal: He exhibits no  distension.  Musculoskeletal: He exhibits no edema.  Neurological: He is alert and oriented to person, place, and time. He displays no atrophy and no tremor. A cranial nerve deficit is present. No sensory deficit. He exhibits normal muscle tone. He displays no seizure activity. Coordination normal.  Expressive aphasia with stuttering speech  Skin: Skin is warm and dry.  Psychiatric: He has a normal mood and affect.    ED Course  Procedures (including critical care time) Labs Review  Labs Reviewed  CBC WITH DIFFERENTIAL - Abnormal; Notable for the following:    WBC 13.2 (*)    MCHC 36.3 (*)    Neutro Abs 8.8 (*)    Monocytes Absolute 1.1 (*)    All other components within normal limits  COMPREHENSIVE METABOLIC PANEL - Abnormal; Notable for the following:    Potassium 3.3 (*)    Glucose, Bld 145 (*)    GFR calc non Af Amer 66 (*)    GFR calc Af Amer 76 (*)    All other components within normal limits  CBG MONITORING, ED - Abnormal; Notable for the following:    Glucose-Capillary 144 (*)    All other components within normal limits  APTT  ETHANOL  TROPONIN I  CK TOTAL AND CKMB  URINE RAPID DRUG SCREEN (HOSP PERFORMED)  URINALYSIS, ROUTINE W REFLEX MICROSCOPIC    Imaging Review Ct Head (brain) Wo Contrast  04/27/2014   CLINICAL DATA:  Altered mental status.  EXAM: CT HEAD WITHOUT CONTRAST  TECHNIQUE: Contiguous axial images were obtained from the base of the skull through the vertex without intravenous contrast.  COMPARISON:  None.  FINDINGS: Bony calvarium appears intact. No mass effect or midline shift is noted. Ventricular size is within normal limits. There is no evidence of mass lesion, hemorrhage or acute infarction.  IMPRESSION: Normal head CT.   Electronically Signed   By: Sabino Dick M.D.   On: 04/27/2014 20:12     EKG Interpretation   Date/Time:  Saturday April 27 2014 19:16:46 EDT Ventricular Rate:  95 PR Interval:  133 QRS Duration: 98 QT Interval:  406 QTC  Calculation: 510 R Axis:   54 Text Interpretation:  Sinus rhythm Abnormal R-wave progression, early  transition Borderline repolarization abnormality Prolonged QT interval  Sinus rhythm Artifact T wave abnormality QT prolonged Abnormal ekg  Confirmed by Carmin Muskrat  MD ([id number]) on 04/27/2014 7:43:53 PM     9:07 PM On repeat exam the patient is still having difficulty with speech. Though the patient is taking anticoagulants, there is concern for stroke. With the passage of time since onset, patient is not a candidate for emergent intervention, however requires further evaluation and management.  An MRI is unavailable this facility, and will not be available for 2 days. Patient was transferred to our affiliated facility for further evaluation and management. I discussed the patient's case with the hospitalist team and our neurology team to facilitate his evaluation. MDM   Final diagnoses:  Dysphasia    Patient presents with new speech difficulty.  Patient is awake alert, but has a notable difficulty with expression.  The patient's neurologic exam is otherwise unremarkable. After initial evaluation in the emergency department here, with unremarkable head CT the patient was transferred to [facility] for further evaluation and management.    Carmin Muskrat, MD 04/27/14 2109

## 2014-04-27 NOTE — ED Notes (Signed)
Lab phoned and stated that [facility]will be sent to [facility]  Since it is not done here. Dr.Lockwood informed and lab is cancelled

## 2014-04-27 NOTE — H&P (Signed)
[facility] History and Physical  [name] FTD:[id number] DOB: 1961/05/09    PCP:   Purvis Kilts, MD   Chief Complaint: aphasia.  HPI: [name] is a 52 y old right handed  male with hx of sleep apnea, HTN, CAD s/p 2 cardiac stents on ASA and Effient, depression and anxiety on Lexapro and Valium, obesity, PAF, tobacco and significant alcohol use (4 hard drinks/day), presents to the ER tonight with expressive aphasia ictus greater than 24 hours ago, along with right upper extremity clumsiness.  He still has aphasia upon presentation in the ER.  No HA, weakness, but a little ataxia.  He had no facial droop or diplopia.  In the ER, work up included a negative head CT, EKG with NSR and QTc of 510 ms, normal renal fx tests, and unremarkable Hb.  Hospitalist was asked to admit him for acute CVA.    Rewiew of Systems:  Constitutional: Negative for malaise, fever and chills. No significant weight loss or weight gain Eyes: Negative for eye pain, redness and discharge, diplopia, visual changes, or flashes of light. ENMT: Negative for ear pain, hoarseness, nasal congestion, sinus pressure and sore throat. No headaches; tinnitus, drooling, or problem swallowing. Cardiovascular: Negative for chest pain, palpitations, diaphoresis, dyspnea and peripheral edema. ; No orthopnea, PND Respiratory: Negative for cough, hemoptysis, wheezing and stridor. No pleuritic chestpain. Gastrointestinal: Negative for nausea, vomiting, diarrhea, constipation, abdominal pain, melena, blood in stool, hematemesis, jaundice and rectal bleeding.    Genitourinary: Negative for frequency, dysuria, incontinence,flank pain and hematuria; Musculoskeletal: Negative for back pain and neck pain. Negative for swelling and trauma.;  Skin: . Negative for pruritus, rash, abrasions, bruising and skin lesion.; ulcerations Neuro: Negative for headache, lightheadedness and neck stiffness. Negative for weakness,  altered level of consciousness , altered mental status, extremity weakness, burning feet, involuntary movement, seizure and syncope.  Psych: negative for depression, insomnia, tearfulness, panic attacks, hallucinations, paranoia, suicidal or homicidal ideation.   Past Medical History  Diagnosis Date  . CAD (coronary artery disease) 2008    bypass grafting   . Morbid obesity   . PAF (paroxysmal atrial fibrillation)   . HTN (hypertension)   . COPD (chronic obstructive pulmonary disease)   . Hyperlipidemia   . Sleep apnea   . S/P colonoscopy 2009    3-4 mm transverse colon erosions likely secondary to  ASA  . S/P endoscopy Dec 2011    moderate erosive gastritis, Barrett's esophagus 1-2cm  . Myocardial infarction 2008,2009,2009  . Chronic kidney disease     hx of kidney stones  . GERD (gastroesophageal reflux disease)   . Barrett's esophagus   . Depression   . Glaucoma   . SOB (shortness of breath) 11/03/2007    2D Echo EF 50%-55%  . Abnormal myocardial perfusion study 01/01/2011    there a small to moderate sized inferobasal scar  . OSA (obstructive sleep apnea)     on cpap  . Diabetes     type 2 diabetes mellitus  . Claudication 11/16/2011    PV test perform shows normal  . Cataracts, bilateral   . Dysrhythmia   . Hernia of abdominal wall     Past Surgical History  Procedure Laterality Date  . Cabg x 4  03/2008  . Hernia repair      ventral hernia repair  . Coronary artery bypass graft  2008    4 vessels  . Cardiac catheterization  2009    stent placement to  the left circumflex a 2.25   . Savory dilation  09/06/2011    Procedure: SAVORY DILATION;  Surgeon: Dorothyann Peng, MD;  Location: [facility];  Service: Endoscopy;  Laterality: N/A;  Dilated with 22mm  . Esophagogastroduodenoscopy  09/2011    Barrett's esophagus, no dysplasia on biopsies. Distal esophagitis. Status post dilation. Moderate gastritis and duodenitis, but biopsies benign. Next EGD in November 2015 for  surveillance of Barrett's esophagus.  . Cardiac catheterization  07/08/2010  . Coronary stent placement  12/29/12  . Coronary stent placement  12/2012    Medications:  HOME MEDS: Prior to Admission medications   Medication Sig Start Date End Date Taking? Authorizing Provider  aspirin EC 81 MG tablet Take 81 mg by mouth 2 (two) times daily.   Yes Historical Provider, MD  Choline Fenofibrate (TRILIPIX) 135 MG capsule Take 135 mg by mouth daily.   Yes Historical Provider, MD  diazepam (VALIUM) 5 MG tablet Take 5 mg by mouth 2 (two) times daily.   Yes Historical Provider, MD  escitalopram (LEXAPRO) 10 MG tablet Take 10 mg by mouth daily. 04/27/14  Yes Historical Provider, MD  furosemide (LASIX) 40 MG tablet Take 80 mg by mouth every morning.   Yes Historical Provider, MD  isosorbide mononitrate (IMDUR) 60 MG 24 hr tablet Take 1 tablet (60 mg total) by mouth 3 (three) times daily. 03/16/14  Yes Scott Joylene Draft, PA-C  lisinopril (PRINIVIL,ZESTRIL) 5 MG tablet Take 5 mg by mouth every morning.    Yes Historical Provider, MD  metoprolol (LOPRESSOR) 100 MG tablet Take 100 mg by mouth 2 (two) times daily.   Yes Historical Provider, MD  nitroGLYCERIN (NITROLINGUAL) 0.4 MG/SPRAY spray Place 1 spray under the tongue every 5 (five) minutes x 3 doses as needed for chest pain.   Yes Historical Provider, MD  pantoprazole (PROTONIX) 40 MG tablet Take 40 mg by mouth daily. 03/19/14  Yes Historical Provider, MD  potassium chloride SA (K-DUR,KLOR-CON) 20 MEQ tablet Take 1 tablet (20 mEq total) by mouth 2 (two) times daily. 07/17/13  Yes Mihai Croitoru, MD  prasugrel (EFFIENT) 10 MG TABS Take 10 mg by mouth every morning.    Yes Historical Provider, MD  ranolazine (RANEXA) 1000 MG SR tablet Take 1 tablet (1,000 mg total) by mouth 2 (two) times daily. 07/17/13  Yes Mihai Croitoru, MD  rosuvastatin (CRESTOR) 20 MG tablet Take 1 tablet (20 mg total) by mouth daily. Needs appointment for further refills   Yes Mihai Croitoru,  MD  sitaGLIPtin (JANUVIA) 100 MG tablet Take 100 mg by mouth every morning.    Yes Historical Provider, MD     Allergies:  Allergies  Allergen Reactions  . Contrast Media [Iodinated Diagnostic Agents]   . Iohexol      Desc: PT. STATES HEART STOPPED HAS TO BE PREMED.     Social History:   reports that he has been smoking Cigarettes.  He has a 15 pack-year smoking history. He does not have any smokeless tobacco history on file. He reports that he drinks alcohol. He reports that he does not use illicit drugs.  Family History: Family History  Problem Relation Age of Onset  . Colon cancer Neg Hx   . Anesthesia problems Neg Hx   . Hypotension Neg Hx   . Malignant hyperthermia Neg Hx   . Pseudochol deficiency Neg Hx      Physical Exam: Filed Vitals:   04/27/14 2000 04/27/14 2007 04/27/14 2015 04/27/14 2130  BP:  100/73  Pulse: 87  88 81  Temp:  98.4 F (36.9 C)    TempSrc:      Resp: 23  23 27   Height:      Weight:      SpO2: 98%  96% 97%   Blood pressure 100/73, pulse 81, temperature 98.4 F (36.9 C), temperature source Oral, resp. rate 27, height 5\' 10"  (1.778 m), weight 136.533 kg (301 lb), SpO2 97.00%.  GEN:  Pleasant  patient lying in the stretcher in no acute distress; cooperative with exam. PSYCH:  alert and oriented x4; does not appear anxious or depressed; affect is appropriate. HEENT: Mucous membranes pink and anicteric; PERRLA; EOM intact; no cervical lymphadenopathy nor thyromegaly or carotid bruit; no JVD; There were no stridor. Neck is very supple. Breasts:: Not examined CHEST WALL: No tenderness CHEST: Normal respiration, clear to auscultation bilaterally.  HEART: Regular rate and rhythm.  There are no murmur, rub, or gallops.   BACK: No kyphosis or scoliosis; no CVA tenderness ABDOMEN: soft and non-tender; no masses, no organomegaly, normal abdominal bowel sounds; no pannus; no intertriginous candida. There is no rebound and no distention. Rectal Exam:  Not done EXTREMITIES: No bone or joint deformity; age-appropriate arthropathy of the hands and knees; no edema; no ulcerations.  There is no calf tenderness. Genitalia: not examined PULSES: 2+ and symmetric SKIN: Normal hydration no rash or ulceration CNS: Cranial nerves 2-12 grossly intact no focal lateralizing neurologic deficit.  Speech is not fluent with expressive aphasia, uvula elevated with phonation, facial symmetry and tongue midline. DTR are normal bilaterally, cerebella exam is intact, barbinski is negative and strengths are equaled bilaterally.  No sensory loss.   Labs on Admission:  Basic Metabolic Panel:  Recent Labs Lab 04/27/14 1937  NA 140  K 3.3*  CL 99  CO2 26  GLUCOSE 145*  BUN 7  CREATININE 1.24  CALCIUM 9.7   Liver Function Tests:  Recent Labs Lab 04/27/14 1937  AST 34  ALT 52  ALKPHOS 83  BILITOT 0.9  PROT 7.1  ALBUMIN 3.6   No results found for this basename: LIPASE, AMYLASE,  in the last 168 hours No results found for this basename: AMMONIA,  in the last 168 hours CBC:  Recent Labs Lab 04/27/14 1937  WBC 13.2*  NEUTROABS 8.8*  HGB 16.4  HCT 45.2  MCV 92.8  PLT 277   Cardiac Enzymes:  Recent Labs Lab 04/27/14 1937  TROPONINI <0.30    CBG:  Recent Labs Lab 04/27/14 1944  GLUCAP 144*     Radiological Exams on Admission: Ct Head (brain) Wo Contrast  04/27/2014   CLINICAL DATA:  Altered mental status.  EXAM: CT HEAD WITHOUT CONTRAST  TECHNIQUE: Contiguous axial images were obtained from the base of the skull through the vertex without intravenous contrast.  COMPARISON:  None.  FINDINGS: Bony calvarium appears intact. No mass effect or midline shift is noted. Ventricular size is within normal limits. There is no evidence of mass lesion, hemorrhage or acute infarction.  IMPRESSION: Normal head CT.   Electronically Signed   By: Sabino Dick M.D.   On: 04/27/2014 20:12    EKG: Independently reviewed. NSR with QTc 510  ms.   Assessment/Plan Present on Admission:  . Morbid obesity . HYPERLIPIDEMIA . OBSTRUCTIVE SLEEP APNEA . HYPERTENSION . PAROXYSMAL ATRIAL FIBRILLATION . Depression  PLAN:  Will admit and transfer him to [facility] for acute CVA work up.  His ictus was greater than 24 hours ago, so  he is not a candidate for TPA, and his symptoms were also mild, not severe enough to warrant TPA.  There will be no MRI at [facility], so will transfer him to [facility] for further imaging.  He will be continued on ASA and Effient.  Will obtain MRI without contrast of the brain, MRA of the brain along with carotid doppler and cardiac ECHO.  He also has risk for alcohol withdrawal, and will be given scheduled ativan with CIWA PRN protocol, along with MVI and thiamine. For his sleep apnea, will continue with Bipap.  For his DM, will use SSI and place on carb modified diet after swallow test.  He was given Escitalopram, but with QTc greater than 500 msec, I am going to discontinue it.  He is stable, full code, and will be admitted to telemetry under [facility] service.  Neurology has been consulted by EDP.  Thank you for allowing me to participate in the care of this nice patient.  I did advice him to quit alcohol and cigarettes.   All questions answered completely.  Other plans as per orders.  Code Status: FULL Haskel Khan, MD. [facility] Pager [phone] 7pm to 7am.  04/27/2014, 9:38 PM

## 2014-04-27 NOTE — ED Notes (Signed)
Dr.Li in with patient for assessment.

## 2014-04-27 NOTE — ED Notes (Signed)
Family states that pt began stuttering and getting words out , but A&O x4

## 2014-04-28 ENCOUNTER — Inpatient Hospital Stay (HOSPITAL_COMMUNITY): Payer: BC Managed Care – PPO

## 2014-04-28 DIAGNOSIS — G4733 Obstructive sleep apnea (adult) (pediatric): Secondary | ICD-10-CM

## 2014-04-28 DIAGNOSIS — E669 Obesity, unspecified: Secondary | ICD-10-CM | POA: Diagnosis present

## 2014-04-28 DIAGNOSIS — I10 Essential (primary) hypertension: Secondary | ICD-10-CM

## 2014-04-28 DIAGNOSIS — E119 Type 2 diabetes mellitus without complications: Secondary | ICD-10-CM

## 2014-04-28 DIAGNOSIS — R4789 Other speech disturbances: Secondary | ICD-10-CM

## 2014-04-28 DIAGNOSIS — G459 Transient cerebral ischemic attack, unspecified: Secondary | ICD-10-CM

## 2014-04-28 DIAGNOSIS — I6350 Cerebral infarction due to unspecified occlusion or stenosis of unspecified cerebral artery: Secondary | ICD-10-CM

## 2014-04-28 LAB — GLUCOSE, CAPILLARY
GLUCOSE-CAPILLARY: 140 mg/dL — AB (ref 70–99)
Glucose-Capillary: 110 mg/dL — ABNORMAL HIGH (ref 70–99)
Glucose-Capillary: 116 mg/dL — ABNORMAL HIGH (ref 70–99)
Glucose-Capillary: 122 mg/dL — ABNORMAL HIGH (ref 70–99)
Glucose-Capillary: 125 mg/dL — ABNORMAL HIGH (ref 70–99)
Glucose-Capillary: 133 mg/dL — ABNORMAL HIGH (ref 70–99)
Glucose-Capillary: 145 mg/dL — ABNORMAL HIGH (ref 70–99)

## 2014-04-28 LAB — CBC
HCT: 42.3 % (ref 39.0–52.0)
Hemoglobin: 14.9 g/dL (ref 13.0–17.0)
MCH: 32.9 pg (ref 26.0–34.0)
MCHC: 35.2 g/dL (ref 30.0–36.0)
MCV: 93.4 fL (ref 78.0–100.0)
Platelets: 230 10*3/uL (ref 150–400)
RBC: 4.53 MIL/uL (ref 4.22–5.81)
RDW: 12.9 % (ref 11.5–15.5)
WBC: 10.5 10*3/uL (ref 4.0–10.5)

## 2014-04-28 LAB — RAPID URINE DRUG SCREEN, HOSP PERFORMED
AMPHETAMINES: NOT DETECTED
Barbiturates: NOT DETECTED
Benzodiazepines: POSITIVE — AB
Cocaine: NOT DETECTED
OPIATES: NOT DETECTED
Tetrahydrocannabinol: NOT DETECTED

## 2014-04-28 LAB — URINALYSIS, ROUTINE W REFLEX MICROSCOPIC
Glucose, UA: NEGATIVE mg/dL
HGB URINE DIPSTICK: NEGATIVE
Ketones, ur: 15 mg/dL — AB
Nitrite: NEGATIVE
PROTEIN: 30 mg/dL — AB
Specific Gravity, Urine: 1.025 (ref 1.005–1.030)
Urobilinogen, UA: 1 mg/dL (ref 0.0–1.0)
pH: 5.5 (ref 5.0–8.0)

## 2014-04-28 LAB — LIPID PANEL
Cholesterol: 129 mg/dL (ref 0–200)
HDL: 21 mg/dL — ABNORMAL LOW (ref >39)
LDL CALC: 47 mg/dL (ref 0–99)
TRIGLYCERIDES: 305 mg/dL — AB (ref <150)
Total CHOL/HDL Ratio: 6.1 RATIO
VLDL: 61 mg/dL — ABNORMAL HIGH (ref 0–40)

## 2014-04-28 LAB — CREATININE, SERUM
CREATININE: 1.1 mg/dL (ref 0.50–1.35)
GFR calc Af Amer: 88 mL/min — ABNORMAL LOW (ref >90)
GFR calc non Af Amer: 76 mL/min — ABNORMAL LOW (ref >90)

## 2014-04-28 LAB — URINE MICROSCOPIC-ADD ON

## 2014-04-28 LAB — HEMOGLOBIN A1C
Hgb A1c MFr Bld: 6.3 % — ABNORMAL HIGH (ref <5.7)
Mean Plasma Glucose: 134 mg/dL — ABNORMAL HIGH (ref <117)

## 2014-04-28 MED ORDER — DIAZEPAM 2 MG PO TABS
2.0000 mg | ORAL_TABLET | Freq: Two times a day (BID) | ORAL | Status: DC
  Administered 2014-04-28 – 2014-04-29 (×3): 2 mg via ORAL
  Filled 2014-04-28 (×3): qty 1

## 2014-04-28 MED ORDER — FUROSEMIDE 80 MG PO TABS
80.0000 mg | ORAL_TABLET | Freq: Every morning | ORAL | Status: DC
  Administered 2014-04-29: 80 mg via ORAL
  Filled 2014-04-28: qty 1

## 2014-04-28 MED ORDER — ASPIRIN EC 81 MG PO TBEC
81.0000 mg | DELAYED_RELEASE_TABLET | Freq: Two times a day (BID) | ORAL | Status: DC
  Administered 2014-04-28 – 2014-04-29 (×3): 81 mg via ORAL
  Filled 2014-04-28 (×3): qty 1

## 2014-04-28 MED ORDER — ESCITALOPRAM OXALATE 10 MG PO TABS
10.0000 mg | ORAL_TABLET | Freq: Every day | ORAL | Status: DC
  Administered 2014-04-28 – 2014-04-29 (×2): 10 mg via ORAL
  Filled 2014-04-28 (×2): qty 1

## 2014-04-28 MED ORDER — ASPIRIN 300 MG RE SUPP: 300.0000 mg | Freq: Once | RECTAL | Status: DC

## 2014-04-28 MED ORDER — POTASSIUM CHLORIDE CRYS ER 20 MEQ PO TBCR: 20.0000 meq | EXTENDED_RELEASE_TABLET | Freq: Two times a day (BID) | ORAL | Status: DC

## 2014-04-28 MED ORDER — POTASSIUM CHLORIDE CRYS ER 20 MEQ PO TBCR
20.0000 meq | EXTENDED_RELEASE_TABLET | Freq: Two times a day (BID) | ORAL | Status: DC
  Administered 2014-04-28 – 2014-04-29 (×3): 20 meq via ORAL
  Filled 2014-04-28: qty 2
  Filled 2014-04-28 (×2): qty 1

## 2014-04-28 MED ORDER — RANOLAZINE ER 500 MG PO TB12
1000.0000 mg | ORAL_TABLET | Freq: Two times a day (BID) | ORAL | Status: DC
  Administered 2014-04-28 – 2014-04-29 (×3): 1000 mg via ORAL
  Filled 2014-04-28 (×3): qty 2

## 2014-04-28 MED ORDER — METOPROLOL TARTRATE 100 MG PO TABS
100.0000 mg | ORAL_TABLET | Freq: Two times a day (BID) | ORAL | Status: DC
  Administered 2014-04-29: 100 mg via ORAL
  Filled 2014-04-28: qty 2
  Filled 2014-04-28: qty 4

## 2014-04-28 NOTE — Evaluation (Signed)
Clinical/Bedside Swallow Evaluation Patient Details  Name: [name] MRN: [id number] Date of Birth: 07-Oct-1961  Today's Date: 04/28/2014 Time: 1045-1100 SLP Time Calculation (min): 15 min  Past Medical History:  Past Medical History  Diagnosis Date  . CAD (coronary artery disease) 2008    bypass grafting   . Morbid obesity   . PAF (paroxysmal atrial fibrillation)   . HTN (hypertension)   . COPD (chronic obstructive pulmonary disease)   . Hyperlipidemia   . Sleep apnea   . S/P colonoscopy 2009    3-4 mm transverse colon erosions likely secondary to  ASA  . S/P endoscopy Dec 2011    moderate erosive gastritis, Barrett's esophagus 1-2cm  . Myocardial infarction 2008,2009,2009  . Chronic kidney disease     hx of kidney stones  . GERD (gastroesophageal reflux disease)   . Barrett's esophagus   . Depression   . Glaucoma   . SOB (shortness of breath) 11/03/2007    2D Echo EF 50%-55%  . Abnormal myocardial perfusion study 01/01/2011    there a small to moderate sized inferobasal scar  . OSA (obstructive sleep apnea)     on cpap  . Diabetes     type 2 diabetes mellitus  . Claudication 11/16/2011    PV test perform shows normal  . Cataracts, bilateral   . Dysrhythmia   . Hernia of abdominal wall    Past Surgical History:  Past Surgical History  Procedure Laterality Date  . Cabg x 4  03/2008  . Hernia repair      ventral hernia repair  . Coronary artery bypass graft  2008    4 vessels  . Cardiac catheterization  2009    stent placement to the left circumflex a 2.25   . Savory dilation  09/06/2011    Procedure: SAVORY DILATION;  Surgeon: Dorothyann Peng, MD;  Location: [facility];  Service: Endoscopy;  Laterality: N/A;  Dilated with 55mm  . Esophagogastroduodenoscopy  09/2011    Barrett's esophagus, no dysplasia on biopsies. Distal esophagitis. Status post dilation. Moderate gastritis and duodenitis, but biopsies benign. Next EGD in November 2015 for surveillance of  Barrett's esophagus.  . Cardiac catheterization  07/08/2010  . Coronary stent placement  12/29/12  . Coronary stent placement  12/2012   HPI:  [name] is a 44 y old right handed  male with hx of sleep apnea, HTN, CAD s/p 2 cardiac stents on ASA and Effient, depression and anxiety on Lexapro and Valium, obesity, PAF, tobacco and significant alcohol use (4 hard drinks/day), presents to the ER tonight with expressive aphasia ictus greater than 24 hours ago, along with right upper extremity clumsiness.   No HA, weakness, but a little ataxia.  He had no facial droop or diplopia.  In the ER, work up included a negative head CT, EKG with NSR and QTc of 510 ms, normal renal fx tests, and unremarkable Hb.   MRI pending.   Barium swallow completed 09/17/13 indicating normal esophageal motility and no observed aspiration.  Patient describes dysphagia as sudden onset of coughing at rest and states he was referred to ENT.    Assessment / Plan / Recommendation Clinical Impression  BSE completed per Stroke Protocol.  No outward clinical s/s of aspiration noted with PO trials. Increased WOB noted at end of evaluation.    Minimal pharyngeal weakness indicated with observed multiple swallows observed with straw sips of thin.  Recommend to proceed with regular consistency solids with thin  liquids by cup sips only.  Recommend aspiration precautions due to [place] significant for dysphagia and present weakness.  No outward s/s of aspiration observed but recommend continued Skilled ST  in acute care setting for diet tolerance secondary to other risk factors.      Aspiration Risk  Moderate    Diet Recommendation Regular;Thin liquid   Liquid Administration via: Cup;No straw Medication Administration: Whole meds with puree Supervision: Patient able to self feed;Intermittent supervision to cue for compensatory strategies Compensations: Slow rate;Small sips/bites Postural Changes and/or Swallow Maneuvers: Out of bed  for meals;Seated upright 90 degrees;Upright 30-60 min after meal    Other  Recommendations Oral Care Recommendations: Oral care Q4 per protocol Other Recommendations: Clarify dietary restrictions   Follow Up Recommendations  Home health SLP    Frequency and Duration min 2x/week  2 weeks       SLP Swallow Goals Please refer to Care Plans    Swallow Study Prior Functional Status   Lives at home. PMH significant for dysphagia.      General Date of Onset: 04/27/14 HPI: [name] is a 13 y old right handed  male with hx of sleep apnea, HTN, CAD s/p 2 cardiac stents on ASA and Effient, depression and anxiety on Lexapro and Valium, obesity, PAF, tobacco and significant alcohol use (4 hard drinks/day), presents to the ER tonight with expressive aphasia ictus greater than 24 hours ago, along with right upper extremity clumsiness.  He still has aphasia upon presentation in the ER.  No HA, weakness, but a little ataxia.  He had no facial droop or diplopia.  In the ER, work up included a negative head CT, EKG with NSR and QTc of 510 ms, normal renal fx tests, and unremarkable Hb.  Hospitalist was asked to admit him for acute CVA.    Type of Study: Bedside swallow evaluation Diet Prior to this Study: NPO Temperature Spikes Noted: No Respiratory Status: Nasal cannula History of Recent Intubation: No Behavior/Cognition: Alert;Cooperative;Pleasant mood Oral Cavity - Dentition: Dentures, top;Dentures, bottom Self-Feeding Abilities: Able to feed self Patient Positioning: Upright in bed Baseline Vocal Quality: Breathy Volitional Cough: Strong Volitional Swallow: Able to elicit    Oral/Motor/Sensory Function Overall Oral Motor/Sensory Function: Appears within functional limits for tasks assessed   Ice Chips Ice chips: Not tested   Thin Liquid Thin Liquid: Impaired Presentation: Cup;Straw Pharyngeal  Phase Impairments: Suspected delayed Swallow;Multiple swallows Other Comments: multiple  swallows with thin by straw     Nectar Thick Nectar Thick Liquid: Not tested   Honey Thick Honey Thick Liquid: Not tested   Puree Puree: Within functional limits Presentation: Self Fed;Spoon   Solid   GO    Solid: Within functional limits Presentation: [place], [place]  [facility] 04/28/2014,11:25 AM

## 2014-04-28 NOTE — Progress Notes (Signed)
Pt. Was placed on BIPAP 10/5 per pt. Comfort via FFM (what pt. Wears at home) with 2L O2 bled in. Pt. Is tolerating BIPAP well at this time without any complications.

## 2014-04-28 NOTE — Progress Notes (Signed)
Chart reviewed.   [facility] PROGRESS NOTE  [name] GLO:[id number] DOB: Mar 17, 1961 DOA: 04/27/2014 PCP: Purvis Kilts, MD  Assessment/Plan:  Principal Problem:   CVA (cerebral infarction): Workup underway. Awaiting speech eval prior to starting diet and oral meds, as he failed RN swallow screen due to dysarthria. Active Problems:   HYPERLIPIDEMIA   Morbid obesity   OBSTRUCTIVE SLEEP APNEA   HYPERTENSION   CORONARY ARTERY DISEASE   PAROXYSMAL ATRIAL FIBRILLATION   GERD   Barrett's esophagus   Depression   Aphasia   Diabetes mellitus, type 2   Obesity, unspecified Daily alcohol use, possiby heavy. Last drink reportedly 4 days ago. No evidence of withdrawal.  Code Status:  full Family Communication:  Wife at bedside Disposition Plan:  home  Consultants:  neuro  Procedures:     Antibiotics:    HPI/Subjective: Speech slightly better. No new deficits. Hungry.  Objective: Filed Vitals:   04/28/14 0822  BP: 126/80  Pulse: 83  Temp: 98.1 F (36.7 C)  Resp: 20    Intake/Output Summary (Last 24 hours) at 04/28/14 0942 Last data filed at 04/28/14 1884  Gross per 24 hour  Intake      0 ml  Output    125 ml  Net   -125 ml   Filed Weights   04/27/14 1859  Weight: 136.533 kg (301 lb)    Exam:   General:  Obese, oriented and appropriate.  Cardiovascular: RRR without MGR  Respiratory: CTA without WRR  Abdomen: S, NT, ND  Ext: no CCE  Neuro: dysarthric and halting speech. CN, sensory motor intact.  Basic Metabolic Panel:  Recent Labs Lab 04/27/14 1937  NA 140  K 3.3*  CL 99  CO2 26  GLUCOSE 145*  BUN 7  CREATININE 1.24  CALCIUM 9.7   Liver Function Tests:  Recent Labs Lab 04/27/14 1937  AST 34  ALT 52  ALKPHOS 83  BILITOT 0.9  PROT 7.1  ALBUMIN 3.6   No results found for this basename: LIPASE, AMYLASE,  in the last 168 hours No results found for this basename: AMMONIA,  in the last 168  hours CBC:  Recent Labs Lab 04/27/14 1937  WBC 13.2*  NEUTROABS 8.8*  HGB 16.4  HCT 45.2  MCV 92.8  PLT 277   Cardiac Enzymes:  Recent Labs Lab 04/27/14 1937  TROPONINI <0.30   BNP (last 3 results)  Recent Labs  03/15/14 1805  PROBNP 27.1   CBG:  Recent Labs Lab 04/27/14 1944 04/28/14 0046 04/28/14 0356 04/28/14 0621 04/28/14 0810  GLUCAP 144* 133* 122* 116* 125*    No results found for this or any previous visit (from the past 240 hour(s)).   Studies: Ct Head (brain) Wo Contrast  04/27/2014   CLINICAL DATA:  Altered mental status.  EXAM: CT HEAD WITHOUT CONTRAST  TECHNIQUE: Contiguous axial images were obtained from the base of the skull through the vertex without intravenous contrast.  COMPARISON:  None.  FINDINGS: Bony calvarium appears intact. No mass effect or midline shift is noted. Ventricular size is within normal limits. There is no evidence of mass lesion, hemorrhage or acute infarction.  IMPRESSION: Normal head CT.   Electronically Signed   By: Sabino Dick M.D.   On: 04/27/2014 20:12    Scheduled Meds: . aspirin EC  81 mg Oral BID  . aspirin  300 mg Rectal Once  . atorvastatin  80 mg Oral q1800  . enoxaparin (LOVENOX) injection  40 mg  Subcutaneous Daily  . fenofibrate  160 mg Oral Daily  . folic acid  1 mg Oral Daily  . insulin aspart  0-15 Units Subcutaneous 6 times per day  . isosorbide mononitrate  60 mg Oral TID WC  . linagliptin  5 mg Oral Daily  . lisinopril  5 mg Oral q morning - 10a  . LORazepam  0-4 mg Oral Q6H   Followed by  . [START ON 04/30/2014] LORazepam  0-4 mg Oral Q12H  . metoprolol  100 mg Oral BID  . multivitamin with minerals  1 tablet Oral Daily  . pantoprazole  40 mg Oral Daily  . potassium chloride SA  20 mEq Oral BID  . prasugrel  10 mg Oral q morning - 10a  . ranolazine  1,000 mg Oral BID  . thiamine  100 mg Oral Daily   Or  . thiamine  100 mg Intravenous Daily   Continuous Infusions: . sodium chloride 100  mL/hr (04/28/14 0121)    Time spent: 35 minutes  [facility] Pager [phone]. If 7PM-7AM, please contact night-coverage at [email or web address], password [email or web address] 04/28/2014, 9:42 AM  LOS: 1 day

## 2014-04-28 NOTE — Progress Notes (Signed)
Utilization Review Completed.Dowell, Deborah T6/28/2015  

## 2014-04-28 NOTE — Progress Notes (Signed)
[facility] progress note. Chief complaint. Transfer note. History of present illness. This 53 year old male presented to [facility] with chief complaint of aphasia. Because [name] has no MRI the patient was felt to require transfer to [facility]. Patient has now arrived in transfer and seeing him at bedside to ensure he remained stable post transfer. Patient has been seen by neurology and will obtain a MRI/MRA, echocardiogram, carotid Dopplers. Continue antiplatelet therapy with aspirin. I found the patient alert in no distress. He has no questions at this time. Vital signs. Temperature 98.6, pulse 75, respiration 18, blood pressure 111/66. O2 sats 98%. General appearance is obese middle-aged male who is alert and in no distress. Cardiac. Rate and rhythm regular. Lungs. Breath sounds clear and equal. Abdomen. Soft with positive bowel sounds. Neurologic. Cranial nerves II through XII grossly intact. No unilateral or focal defects. I did not assess his gait. Impression/plan. Problem #1. Aphasia. Patient transferred to [facility] for CVA workup. He has already been seen by neurology. Will be for MRI/MRA of the brain, carotid Dopplers, echocardiogram. Problem #2. Potential alcohol withdrawal. Patient to ordered as you are protocol with Ativan, MVI, thiamine. Problem #3. Sleep apnea. Patient continues on home BiPAP. Problem #4. Diabetes. Continues with sliding-scale insulin cart modified diet following swallowing test. Patient appears clinically stable post transfer. All orders appear to of transferred appropriately.

## 2014-04-28 NOTE — Consult Note (Signed)
Neurology Consultation Reason for Consult: Difficulty speaking Referring Physician: Susanne Borders  CC: Difficulty speaking  History is obtained from: Patient, wife  HPI: DOY TAAFFE is a 53 y.o. male a history of hypertension, hyperlipidemia, coronary artery disease, paroxysmal atrial fibrillation who presents with right arm clumsiness and difficulty speaking this started on Thursday morning. He states that this has been getting steadily better since that time, almost back to normal, but his wife insisted that he calmed to be checked out in the emergency room at [facility]. He was transferred to [facility] because of unavailability of MRI at [facility] over the weekend.  Of note he has had recent difficulties with trouble swallowing and has been treated with Valium for this.  LKW: Thursday, 6/25 tpa given?: no, outside of window    ROS: A 14 point ROS was performed and is negative except as noted in the HPI.  Past Medical History  Diagnosis Date  . CAD (coronary artery disease) 2008    bypass grafting   . Morbid obesity   . PAF (paroxysmal atrial fibrillation)   . HTN (hypertension)   . COPD (chronic obstructive pulmonary disease)   . Hyperlipidemia   . Sleep apnea   . S/P colonoscopy 2009    3-4 mm transverse colon erosions likely secondary to  ASA  . S/P endoscopy Dec 2011    moderate erosive gastritis, Barrett's esophagus 1-2cm  . Myocardial infarction 2008,2009,2009  . Chronic kidney disease     hx of kidney stones  . GERD (gastroesophageal reflux disease)   . Barrett's esophagus   . Depression   . Glaucoma   . SOB (shortness of breath) 11/03/2007    2D Echo EF 50%-55%  . Abnormal myocardial perfusion study 01/01/2011    there a small to moderate sized inferobasal scar  . OSA (obstructive sleep apnea)     on cpap  . Diabetes     type 2 diabetes mellitus  . Claudication 11/16/2011    PV test perform shows normal  . Cataracts, bilateral   . Dysrhythmia   .  Hernia of abdominal wall     Family History: Brother-stroke  Social History: Tob: Current smoker, I counseled him to quit  Exam: Current vital signs: BP 113/66  Pulse 87  Temp(Src) 99 F (37.2 C) (Oral)  Resp 18  Ht 5\' 10"  (1.778 m)  Wt 136.533 kg (301 lb)  BMI 43.19 kg/m2  SpO2 97% Vital signs in last 24 hours: Temp:  [98.4 F (36.9 C)-99 F (37.2 C)] 99 F (37.2 C) (06/28 0000) Pulse Rate:  [80-88] 87 (06/28 0010) Resp:  [18-27] 18 (06/28 0010) BP: (100-124)/(53-76) 113/66 mmHg (06/28 0010) SpO2:  [95 %-98 %] 97 % (06/28 0010) Weight:  [136.533 kg (301 lb)] 136.533 kg (301 lb) (06/27 1859)  General: In bed, NAD CV: Regular rate and rhythm Mental Status: Patient is awake, alert, oriented to person, place, month, year, and situation. Immediate and remote memory are intact. Patient is able to give a clear and coherent history. No signs of  Neglect He has very mild hesitancy of speech. He also has stutter. Cranial Nerves: II: Visual Fields are full. Pupils are equal, round, and reactive to light.  Discs are difficult to visualize. III,IV, VI: EOMI without ptosis or diploplia.  V: Facial sensation is symmetric to temperature VII: Facial movement is symmetric.  VIII: hearing is intact to voice X: Uvula elevates symmetrically XI: Shoulder shrug is symmetric. XII: tongue is midline  without atrophy or fasciculations.  Motor: Tone is normal. Bulk is normal. 5/5 strength was present in all four extremities.  Sensory: Sensation is symmetric to light touch and temperature in the arms and legs. Deep Tendon Reflexes: 2+ and symmetric in the biceps and patellae.  Cerebellar: FNF and HKS are intact bilaterally Gait: Not tested due to patient safety concerns  I have reviewed labs in epic and the results pertinent to this consultation are: CMP-mild hypokalemia  I have reviewed the images obtained: CT head-unremarkable  Impression: 53 year old male with a history of  multiple vascular risk factors who presents with right arm clumsiness and difficulty speaking. With 3 days' duration, I suspect small cortical infarct.  Recommendations: 1. HgbA1c, fasting lipid panel 2. MRI, MRA  of the brain without contrast 3. Frequent neuro checks 4. Echocardiogram 5. Carotid dopplers 6. Prophylactic therapy-Antiplatelet med: Aspirin - dose 325mg  PO or 300mg  PR 7. Risk factor modification 8. Telemetry monitoring 9. PT consult, OT consult, Speech consult 10. If stroke is proven on MRI, prasugrel does carry a black box warning against its use in patients with TIA or stroke. Would consider alternative second agent.    Roland Rack, MD [facility] [phone]  If 7pm- 7am, please page neurology on call as listed in [place].

## 2014-04-28 NOTE — Progress Notes (Signed)
VASCULAR LAB PRELIMINARY  PRELIMINARY  PRELIMINARY  PRELIMINARY  Carotid Dopplers completed.    Preliminary report:  1-39% ICA stenosis.  Vertebral artery flow is antegrade.   KANADY, CANDACE, RVT 04/28/2014, 6:54 PM

## 2014-04-28 NOTE — Progress Notes (Signed)
Echo Lab  2D Echocardiogram completed.  Katherine, RDCS 04/28/2014 9:04 AM

## 2014-04-28 NOTE — Progress Notes (Signed)
Pt transferred to unit via [facility] x 2. Pt connected to telemetry and central monitoring notified. No signs or symptoms of acute distress. No complaints of pain or discomfort. Pt made comfortable, bed in lowest position, call bell in reach. Will continue to monitor. Fortino Sic, RN, BSN 04/28/2014 12:10 AM

## 2014-04-28 NOTE — Progress Notes (Signed)
Stroke Team Progress Note  HISTORY [name] is a 53 y.o. male a history of hypertension, hyperlipidemia, coronary artery disease, paroxysmal atrial fibrillation who presented with right arm clumsiness and difficulty speaking that started on Thursday morning. He stated that this had been getting steadily better since that time, almost back to normal, but his wife insisted that he come to be checked out in the emergency room at [facility] on the evening of 04/27/2014. He was transferred to [facility] cone because of unavailability of MRI at [facility] over the weekend.  Of note he has had recent difficulties with swallowing and has been treated with Valium for this.    LKW: Thursday, 6/25  tpa given?: no, outside of window    SUBJECTIVE The patient's wife is at the bedside. She does not feel that the patient's speech is back to baseline. The patient voices no complaints.  OBJECTIVE Most recent Vital Signs: Filed Vitals:   04/28/14 0400 04/28/14 0600 04/28/14 0822 04/28/14 1000  BP: 104/66 108/55 126/80 111/74  Pulse: 75 81 83 83  Temp: 98.1 F (36.7 C) 97.9 F (36.6 C) 98.1 F (36.7 C) 98.2 F (36.8 C)  TempSrc: Oral Oral Oral Oral  Resp: 20 18 20 20   Height:      Weight:      SpO2: 97% 95% 96% 94%   CBG (last 3)   Recent Labs  04/28/14 0356 04/28/14 0621 04/28/14 0810  GLUCAP 122* 116* 125*    IV Fluid Intake:   . sodium chloride 100 mL/hr (04/28/14 0121)    MEDICATIONS  . aspirin EC  81 mg Oral BID  . aspirin  300 mg Rectal Once  . atorvastatin  80 mg Oral q1800  . enoxaparin (LOVENOX) injection  40 mg Subcutaneous Daily  . fenofibrate  160 mg Oral Daily  . insulin aspart  0-15 Units Subcutaneous 6 times per day  . isosorbide mononitrate  60 mg Oral TID WC  . linagliptin  5 mg Oral Daily  . lisinopril  5 mg Oral q morning - 10a  . LORazepam  0-4 mg Oral Q6H   Followed by  . [START ON 04/30/2014] LORazepam  0-4 mg Oral Q12H  . metoprolol  100 mg Oral BID   . pantoprazole  40 mg Oral Daily  . potassium chloride SA  20 mEq Oral BID  . prasugrel  10 mg Oral q morning - 10a  . ranolazine  1,000 mg Oral BID  . thiamine  100 mg Oral Daily   Or  . thiamine  100 mg Intravenous Daily   PRN:  LORazepam, LORazepam  Diet:  NPO no liquids Activity:  Bathroom privileges with assistance DVT Prophylaxis:  Lovenox  CLINICALLY SIGNIFICANT STUDIES Basic Metabolic Panel:   Recent Labs Lab 04/27/14 1937  NA 140  K 3.3*  CL 99  CO2 26  GLUCOSE 145*  BUN 7  CREATININE 1.24  CALCIUM 9.7   Liver Function Tests:   Recent Labs Lab 04/27/14 1937  AST 34  ALT 52  ALKPHOS 83  BILITOT 0.9  PROT 7.1  ALBUMIN 3.6   CBC:   Recent Labs Lab 04/27/14 1937  WBC 13.2*  NEUTROABS 8.8*  HGB 16.4  HCT 45.2  MCV 92.8  PLT 277   Coagulation: No results found for this basename: LABPROT, INR,  in the last 168 hours Cardiac Enzymes:   Recent Labs Lab 04/27/14 1937  TROPONINI <0.30   Urinalysis:   Recent Labs Lab 04/28/14  0830  COLORURINE ORANGE*  LABSPEC 1.025  PHURINE 5.5  GLUCOSEU NEGATIVE  HGBUR NEGATIVE  BILIRUBINUR SMALL*  KETONESUR 15*  PROTEINUR 30*  UROBILINOGEN 1.0  NITRITE NEGATIVE  LEUKOCYTESUR TRACE*   Lipid Panel    Component Value Date/Time   CHOL 129 04/28/2014 0121   TRIG 305* 04/28/2014 0121   HDL 21* 04/28/2014 0121   CHOLHDL 6.1 04/28/2014 0121   VLDL 61* 04/28/2014 0121   LDLCALC 47 04/28/2014 0121   HgbA1C  Lab Results  Component Value Date   HGBA1C  Value: 6.1 (NOTE)   The ADA recommends the following therapeutic goals for glycemic   control related to Hgb A1C measurement:   Goal of Therapy:   < 7.0% Hgb A1C   Action Suggested:  > 8.0% Hgb A1C   Ref:  Diabetes Care, 22, Suppl. 1, 1999 01/11/2008    Urine Drug Screen:   No results found for this basename: labopia,  cocainscrnur,  labbenz,  amphetmu,  thcu,  labbarb    Alcohol Level:   Recent Labs Lab 04/27/14 1937  ETH <11    Ct Head (brain) Wo  Contrast 04/27/2014    Normal head CT.     MRI of the brain  pending  MRA of the brain  pending  Carotid Doppler  pending  2D Echocardiogram  pending  Chest CT 03/15/2014  1. Tiny right paramediastinal pneumothorax. 2. Multiple bilateral pulmonary nodules, many of which are stable since 2009. There are a few new pulmonary nodules as detailed above common with the largest measuring 6 mm. If the patient is at high risk for bronchogenic carcinoma, follow-up chest CT at 6-12 months is recommended. If the patient is at low risk for bronchogenic carcinoma, follow-up chest CT at 12 months is recommended. This recommendation follows the consensus statement: Guidelines for Management of Small Pulmonary Nodules Detected on CT Scans: A Statement from the [place] as published in Radiology 2005;237:395-400. 3. Hepatic steatosis  Critical Value/emergent results were called by telephone at the time of interpretation on 03/15/2014 at 8:10 PM to Dr. Ezequiel Essex ,who verbally acknowledged these results.   EKG  Sinus rhythm rate 95 beats per minute. For complete results please see formal report.   Therapy Recommendations pending  Physical Exam    Mental Status:  Patient is awake, alert, oriented to person, place, month, year, and situation.  Immediate and remote memory are intact.  Patient is able to give a clear and coherent history.  No signs of Neglect  He has very mild hesitancy of speech. He also has stutter.  Cranial Nerves:  II: Visual Fields are full. Pupils are equal, round, and reactive to light. Discs are difficult to visualize.  III,IV, VI: EOMI without ptosis or diploplia.  V: Facial sensation is symmetric to temperature  VII: Facial movement is symmetric.  VIII: hearing is intact to voice  X: Uvula elevates symmetrically  XI: Shoulder shrug is symmetric.  XII: tongue is midline without atrophy or fasciculations.  Motor:  Tone is normal. Bulk is normal. 5/5  strength was present in all four extremities.  Sensory:  Sensation is symmetric to light touch and temperature in the arms and legs.  Deep Tendon Reflexes:  2+ and symmetric in the biceps and patellae.  Cerebellar:  FNF and HKS are intact bilaterally  Gait:  Not tested due to patient safety concerns   ASSESSMENT [name] is a 53 y.o. male presenting with speech difficulties, difficulty swallowing, and right upper  extremity clumsiness. TPA was not initiated secondary to late presentation. A CT of the head was negative for infarct. An MRI/MRA is pending. On aspirin 81 mg orally every day prior to admission. Now on Aspirin suppository 300 mg daily for secondary stroke prevention. Patient with resultant mild speech difficulties. Stroke work up underway.   Diabetes mellitus - hemoglobin A1c pending  Hyperlipidemia - Lipitor prior to admission and currently - cholesterol 129; LDL 47  Hypertension  Coronary artery disease - status post 2 cardiac stents and CABG  Significant alcohol use  Paroxysmal atrial fibrillation  Obesity  Tobacco use - history of pulmonary nodules by CT 03/15/2014  Obstructive sleep apnea  NPO  Anxiety/depression   Hospital day # 1  TREATMENT/PLAN  Continue Aspirin suppository 300 mg daily until swallowing has been cleared for secondary stroke prevention.  Consider anticoagulation for history of paroxysmal fibrillation  Await MRI/MRA, 2-D echo, carotid Dopplers, and hemoglobin A1c.  Await therapy evaluations  Tobacco history - strongly recommend cessation  Speech therapy to evaluate swallowing.  SIGNED Mikey Bussing PA-C [facility] Pager [phone] 04/28/2014, 11:24 AM   I have personally obtained a history, examined the patient, evaluated imaging results, and formulated the assessment and plan of care. I agree with the above.    To contact Stroke Continuity provider, please refer to [email or web address] After  hours, contact General Neurology

## 2014-04-29 DIAGNOSIS — I2510 Atherosclerotic heart disease of native coronary artery without angina pectoris: Secondary | ICD-10-CM

## 2014-04-29 DIAGNOSIS — I4891 Unspecified atrial fibrillation: Secondary | ICD-10-CM

## 2014-04-29 LAB — GLUCOSE, CAPILLARY
GLUCOSE-CAPILLARY: 143 mg/dL — AB (ref 70–99)
GLUCOSE-CAPILLARY: 145 mg/dL — AB (ref 70–99)
Glucose-Capillary: 130 mg/dL — ABNORMAL HIGH (ref 70–99)

## 2014-04-29 MED ORDER — ASPIRIN EC 81 MG PO TBEC: 81.0000 mg | DELAYED_RELEASE_TABLET | Freq: Every day | ORAL | Status: DC

## 2014-04-29 MED ORDER — APIXABAN 5 MG PO TABS: 5.0000 mg | ORAL_TABLET | Freq: Two times a day (BID) | ORAL | Status: DC

## 2014-04-29 NOTE — Progress Notes (Signed)
Discharge instructions reviewed with patient and family member, questions answered, verbalized understanding. Reviewed stroke education handouts with patient.   Patient given written prescription for Eliquis and told to reduce his Aspirin down to once a day and begin Eliquis.  Patient transported to main entrance of hospital via wheelchair to be taken home by family member.  Patient in good condition at time of discharge from [facility].

## 2014-04-29 NOTE — Evaluation (Signed)
Speech Language Pathology Evaluation Patient Details Name: [name] MRN: [id number] DOB: 07-06-1961 Today's Date: 04/29/2014 Time: 9937-1696 SLP Time Calculation (min): 28 min  Problem List:  Patient Active Problem List   Diagnosis Date Noted  . Diabetes mellitus, type 2 04/28/2014  . Obesity, unspecified 04/28/2014  . CVA (cerebral infarction) 04/27/2014  . Aphasia 04/27/2014  . Pneumothorax on right 03/16/2014  . Lung nodule 03/16/2014  . Chest pain 03/15/2014  . Angina, class III 03/14/2013  . Tobacco abuse 03/14/2013  . Depression 03/14/2013  . Unstable angina 12/30/2012  . Abnormal nuclear stress test 12/30/2012  . Barrett's esophagus 08/25/2011  . ABDOMINAL PAIN, EPIGASTRIC 09/29/2010  . GERD 06/01/2010  . HEMATOCHEZIA 06/01/2010  . HEMATURIA UNSPECIFIED 06/01/2010  . ABDOMINAL PAIN 06/01/2010  . OBSTRUCTIVE SLEEP APNEA 06/21/2008  . RESTLESS LEGS SYNDROME 06/21/2008  . MYOCARDIAL INFARCTION 06/21/2008  . ALLERGIC RHINITIS 06/21/2008  . HYPERLIPIDEMIA 06/20/2008  . Morbid obesity 06/20/2008  . HYPERTENSION 06/20/2008  . CORONARY ARTERY DISEASE 06/20/2008  . PAROXYSMAL ATRIAL FIBRILLATION 06/20/2008   Past Medical History:  Past Medical History  Diagnosis Date  . CAD (coronary artery disease) 2008    bypass grafting   . Morbid obesity   . PAF (paroxysmal atrial fibrillation)   . HTN (hypertension)   . COPD (chronic obstructive pulmonary disease)   . Hyperlipidemia   . Sleep apnea   . S/P colonoscopy 2009    3-4 mm transverse colon erosions likely secondary to  ASA  . S/P endoscopy Dec 2011    moderate erosive gastritis, Barrett's esophagus 1-2cm  . Myocardial infarction 2008,2009,2009  . Chronic kidney disease     hx of kidney stones  . GERD (gastroesophageal reflux disease)   . Barrett's esophagus   . Depression   . Glaucoma   . SOB (shortness of breath) 11/03/2007    2D Echo EF 50%-55%  . Abnormal myocardial perfusion study 01/01/2011   there a small to moderate sized inferobasal scar  . OSA (obstructive sleep apnea)     on cpap  . Diabetes     type 2 diabetes mellitus  . Claudication 11/16/2011    PV test perform shows normal  . Cataracts, bilateral   . Dysrhythmia   . Hernia of abdominal wall    Past Surgical History:  Past Surgical History  Procedure Laterality Date  . Cabg x 4  03/2008  . Hernia repair      ventral hernia repair  . Coronary artery bypass graft  2008    4 vessels  . Cardiac catheterization  2009    stent placement to the left circumflex a 2.25   . Savory dilation  09/06/2011    Procedure: SAVORY DILATION;  Surgeon: Dorothyann Peng, MD;  Location: [facility];  Service: Endoscopy;  Laterality: N/A;  Dilated with 58mm  . Esophagogastroduodenoscopy  09/2011    Barrett's esophagus, no dysplasia on biopsies. Distal esophagitis. Status post dilation. Moderate gastritis and duodenitis, but biopsies benign. Next EGD in November 2015 for surveillance of Barrett's esophagus.  . Cardiac catheterization  07/08/2010  . Coronary stent placement  12/29/12  . Coronary stent placement  12/2012   HPI:  [name] is a 53 y old right handed  male with hx of sleep apnea, HTN, CAD s/p 2 cardiac stents on ASA and Effient, depression and anxiety on Lexapro and Valium, obesity, PAF, tobacco and significant alcohol use (4 hard drinks/day), presents to the ER tonight with expressive aphasia ictus greater  than 24 hours ago, along with right upper extremity clumsiness.  He still has aphasia upon presentation in the ER.  No HA, weakness, but a little ataxia.  He had no facial droop or diplopia.  In the ER, work up included a negative head CT, EKG with NSR and QTc of 510 ms, normal renal fx tests, and unremarkable Hb.  Hospitalist was asked to admit him for acute CVA.   MRI negative for acute event.     Assessment / Plan / Recommendation Clinical Impression  Pt presents with mild language deficits resulting in occasional  minimal delays/pauses resulting in mild dysfluency.  He was oriented x4, able to name all home medications and follow directions.  Pt named 13 animals in 60 seconds and communicate effectively at conversation level with a ? slight flat affect.  He is on disability and reports his spouse manages home finances but pt cooks, cleans, and manages his own medications.    Pt's cognitive linguistic abilities appear functional and his MRI was negative, therefore do not recommend any follow up.  He is in agreement to plan.       SLP Assessment  Patient does not need any further Speech Lanaguage Pathology Services    Follow Up Recommendations  None    Frequency and Duration   n/a     Pertinent Vitals/Pain Afebrile, decreased    SLP Evaluation Prior Functioning  Cognitive/Linguistic Baseline: Within functional limits Type of Home: House  Lives With: Spouse;Other (Comment) (19 yo mother lives with them but is independent, son when he arrives home from [employer]) Education: on disability since 2007 from [place] Vocation: On disability   Cognition  Overall Cognitive Status: Within Functional Limits for tasks assessed Arousal/Alertness: Awake/alert Orientation Level: Oriented X4 Attention: Sustained;Selective Sustained Attention: Appears intact Selective Attention: Appears intact Memory: Appears intact (able to recall all home medications) Awareness: Appears intact Problem Solving: Appears intact Safety/Judgment: Appears intact    Comprehension  Auditory Comprehension Overall Auditory Comprehension: Appears within functional limits for tasks assessed Yes/No Questions: Not tested Conversation: Complex Visual Recognition/Discrimination Discrimination: Not tested Reading Comprehension Reading Status: Within funtional limits [facility] for reading daily functional signs)    Expression Expression Primary Mode of Expression: Verbal Verbal Expression Overall Verbal Expression: Impaired Initiation:  Impaired Level of Generative/Spontaneous Verbalization: Conversation;Sentence;Phrase Repetition: No impairment Naming: No impairment Pragmatics: No impairment Written Expression Dominant Hand: Right Written Expression: Not tested   Oral / Motor Oral Motor/Sensory Function Overall Oral Motor/Sensory Function: Appears within functional limits for tasks assessed Motor Speech Overall Motor Speech: Appears within functional limits for tasks assessed (intermittent pause immediately before articulation noted) Motor Planning: Impaired Level of Impairment: Phrase Motor Speech Errors: Groping for words Effective Techniques: Pause   GO     Luanna Salk, [place] [facility] SLP [phone]

## 2014-04-29 NOTE — Progress Notes (Signed)
Stroke Team Progress Note  HISTORY [name] is a 53 y.o. male a history of hypertension, hyperlipidemia, coronary artery disease, paroxysmal atrial fibrillation who presented with right arm clumsiness and difficulty speaking that started on Thursday morning. He stated that this had been getting steadily better since that time, almost back to normal, but his wife insisted that he come to be checked out in the emergency room at [facility] on the evening of 04/27/2014. He was transferred to [facility] cone because of unavailability of MRI at [facility] over the weekend.  Of note he has had recent difficulties with swallowing and has been treated with Valium for this.    LKW: Thursday, 6/25  tpa given?: no, outside of window   SUBJECTIVE No acute events overnight. The patient is stuttering slightly, endorses significant anxiety due to pending separation from wife.    OBJECTIVE Most recent Vital Signs: Filed Vitals:   04/28/14 2135 04/28/14 2228 04/29/14 0233 04/29/14 0522  BP:  101/59 118/77 115/71  Pulse: 83 80 70 62  Temp:  97.8 F (36.6 C) 97.9 F (36.6 C) 97.3 F (36.3 C)  TempSrc:  Axillary Axillary Axillary  Resp: 18 22 20 18   Height:      Weight:      SpO2: 95% 98% 92% 95%   CBG (last 3)   Recent Labs  04/28/14 2013 04/29/14 0009 04/29/14 0359  GLUCAP 140* 143* 145*    IV Fluid Intake:      MEDICATIONS  . aspirin EC  81 mg Oral BID  . aspirin  300 mg Rectal Once  . atorvastatin  80 mg Oral q1800  . diazepam  2 mg Oral BID  . enoxaparin (LOVENOX) injection  40 mg Subcutaneous Daily  . escitalopram  10 mg Oral Daily  . fenofibrate  160 mg Oral Daily  . furosemide  80 mg Oral q morning - 10a  . insulin aspart  0-15 Units Subcutaneous 6 times per day  . isosorbide mononitrate  60 mg Oral TID WC  . linagliptin  5 mg Oral Daily  . lisinopril  5 mg Oral q morning - 10a  . LORazepam  0-4 mg Oral Q6H   Followed by  . [START ON 04/30/2014] LORazepam  0-4 mg Oral  Q12H  . metoprolol  100 mg Oral BID  . pantoprazole  40 mg Oral Daily  . potassium chloride SA  20 mEq Oral BID  . prasugrel  10 mg Oral q morning - 10a  . ranolazine  1,000 mg Oral BID  . thiamine  100 mg Oral Daily   Or  . thiamine  100 mg Intravenous Daily   PRN:  LORazepam, LORazepam  Diet:    Cardiac, thin liquids Activity:  Bathroom privileges with assistance DVT Prophylaxis:  Lovenox  CLINICALLY SIGNIFICANT STUDIES Basic Metabolic Panel:   Recent Labs Lab 04/27/14 1937  NA 140  K 3.3*  CL 99  CO2 26  GLUCOSE 145*  BUN 7  CREATININE 1.24  CALCIUM 9.7   Liver Function Tests:   Recent Labs Lab 04/27/14 1937  AST 34  ALT 52  ALKPHOS 83  BILITOT 0.9  PROT 7.1  ALBUMIN 3.6   CBC:   Recent Labs Lab 04/27/14 1937  WBC 13.2*  NEUTROABS 8.8*  HGB 16.4  HCT 45.2  MCV 92.8  PLT 277   Coagulation: No results found for this basename: LABPROT, INR,  in the last 168 hours Cardiac Enzymes:   Recent Labs Lab  04/27/14 1937  TROPONINI <0.30   Urinalysis:   Recent Labs Lab 04/28/14 0830  COLORURINE ORANGE*  LABSPEC 1.025  PHURINE 5.5  GLUCOSEU NEGATIVE  HGBUR NEGATIVE  BILIRUBINUR SMALL*  KETONESUR 15*  PROTEINUR 30*  UROBILINOGEN 1.0  NITRITE NEGATIVE  LEUKOCYTESUR TRACE*   Lipid Panel    Component Value Date/Time   CHOL 129 04/28/2014 0121   TRIG 305* 04/28/2014 0121   HDL 21* 04/28/2014 0121   CHOLHDL 6.1 04/28/2014 0121   VLDL 61* 04/28/2014 0121   LDLCALC 47 04/28/2014 0121   HgbA1C  Lab Results  Component Value Date   HGBA1C  Value: 6.1 (NOTE)   The ADA recommends the following therapeutic goals for glycemic   control related to Hgb A1C measurement:   Goal of Therapy:   < 7.0% Hgb A1C   Action Suggested:  > 8.0% Hgb A1C   Ref:  Diabetes Care, 22, Suppl. 1, 1999 01/11/2008    Urine Drug Screen:      Component Value Date/Time   LABOPIA NONE DETECTED 04/28/2014 0830    Alcohol Level:   Recent Labs Lab 04/27/14 1937  ETH <11     Ct Head (brain) Wo Contrast 04/27/2014    Normal head CT.     [place] of the brain 04/28/14 No acute infarct. Normal MRA.   Carotid Doppler  pending  2D Echocardiogram 04/28/14 EF 55-60%. Grade 2 diastolic dysfunction. No definite source of emboli identified.   Chest CT 03/15/2014  1. Tiny right paramediastinal pneumothorax. 2. Multiple bilateral pulmonary nodules, many of which are stable since 2009. There are a few new pulmonary nodules as detailed above common with the largest measuring 6 mm. If the patient is at high risk for bronchogenic carcinoma, follow-up chest CT at 6-12 months is recommended. If the patient is at low risk for bronchogenic carcinoma, follow-up chest CT at 12 months is recommended. This recommendation follows the consensus statement: Guidelines for Management of Small Pulmonary Nodules Detected on CT Scans: A Statement from the [place] as published in Radiology 2005;237:395-400. 3. Hepatic steatosis  Critical Value/emergent results were called by telephone at the time of interpretation on 03/15/2014 at 8:10 PM to Dr. Ezequiel Essex ,who verbally acknowledged these results.   EKG  Sinus rhythm rate 95 beats per minute. For complete results please see formal report.   Therapy Recommendations no needs identified  Physical Exam   Blood pressure 115/71, pulse 62, temperature 97.3 F (36.3 C), temperature source Axillary, resp. rate 18, height 5\' 10"  (1.778 m), weight 136.533 kg (301 lb), SpO2 95.00%.  Mental Status:  Patient is awake, alert, oriented to person, place, month, year, and situation.  Immediate and remote memory are intact.  Patient is able to give a clear and coherent history.  No signs of Neglect  He has very mild stutter.  Cranial Nerves:  II: Visual Fields are full. Pupils are equal, round, and reactive to light.  III,IV, VI: EOMI without ptosis or diploplia.  V: Facial sensation is symmetric to temperature  VII: Facial  movement is symmetric.  VIII: hearing is intact to voice  X: Uvula elevates symmetrically  XI: Shoulder shrug is symmetric.  XII: tongue is midline without atrophy or fasciculations.  Motor:  Tone is normal. Bulk is normal. 5/5 strength was present in all four extremities.  Sensory:  Sensation is symmetric to light touch and temperature in the arms and legs.  Deep Tendon Reflexes:  2+ and symmetric in the biceps and patellae.  Cerebellar:  FNF and HKS are intact bilaterally  Gait:  Steady   ASSESSMENT Mr. [name] is a 53 y.o. male presenting with speech difficulties, difficulty swallowing, and right upper extremity clumsiness. TPA was not initiated secondary to late presentation. CT and MRI were negative for infarct.  On aspirin 81 mg orally every day prior to admission. Now on Aspirin suppository 300 mg daily for secondary stroke prevention. Patient with resultant mild stutter. Patient with multiple stroke risk factors,TIA work up underway.   Diabetes mellitus - hemoglobin A1c 6.1  Hyperlipidemia - Lipitor prior to admission and currently - cholesterol 129; LDL 47  Hypertension  Coronary artery disease - status post 2 cardiac stents and CABG  Significant alcohol use  Paroxysmal atrial fibrillation  Obesity  Tobacco use - history of pulmonary nodules by CT 03/15/2014  Obstructive sleep apnea  Anxiety/depression  Hospital day # 2  TREATMENT/PLAN  Consider stopping ASA and starting apixaban for stroke prevention due to history of paroxysmal fibrillation  MRI/MRA shows no acute stroke, sx likely due to TIA vs anxiety  Patient with multiple risk factors for stroke, recommend aggressive risk factor managment  Echo did not show thrombus or substantially reduced EF.   Carotid Dopplers pending  LDL 47, continue atorvastatin 80 mg daily  Hemoglobin A1c 6.1, diabetes education  Tobacco history - strongly recommend cessation  Cardiac diet, thin  liquids  Patient endorses significant anxiety and depression due to pending separation from wife. Denies suicidal ideation and/or homicidal ideation. Advised patient to call 911 or go straight to the ER in the event of any SI or HI. Consider prescribing SSRI and referral to outpatient psychiatry in or near [place] for follow up.   SIGNED Delbert Phenix, MSN, ANP-C, CNRN, MSCS [facility] Stroke Team [phone]  04/29/2014, 9:01 AM   I have personally obtained a history, examined the patient, evaluated imaging results, and formulated the assessment and plan of care. I agree with the above.  Jim Like, DO [facility] Pager: [phone]   To contact Stroke Continuity provider, please refer to [email or web address] After hours, contact General Neurology

## 2014-04-29 NOTE — Progress Notes (Signed)
BENEFIT  CHECK Pharmacy called/ [facility] in [place], talked to Manuela Schwartz - co pay for Eliquis would be $35 for a months supply; $10 co pay coupon card given to patientAneta Mins [phone]

## 2014-04-29 NOTE — Evaluation (Signed)
Physical Therapy Evaluation Patient Details Name: [name] MRN: [id number] DOB: Jul 28, 1961 Today's Date: 04/29/2014   History of Present Illness  53 y.o. male a history of hypertension, hyperlipidemia, COPD, coronary artery disease, paroxysmal atrial fibrillation who presented with right arm clumsiness and difficulty speaking. MRI negative for acute infarct.  Clinical Impression  Patient presents with no skilled PT needs at this time as close to baseline level of function.  Did educate patient in stroke warning signs and need for quick arrival at ED for treatment.  Also discussed risk factor management.    Follow Up Recommendations No PT follow up    Equipment Recommendations  None recommended by PT    Recommendations for Other Services       Precautions / Restrictions Precautions Precautions: None Restrictions Weight Bearing Restrictions: No      Mobility  Bed Mobility               General bed mobility comments: not assessed  Transfers Overall transfer level: Modified independent                  Ambulation/Gait Ambulation/Gait assistance: Independent Ambulation Distance (Feet): 200 Feet Assistive device: None Gait Pattern/deviations: WFL(Within Functional Limits)   Gait velocity interpretation: >2.62 ft/sec, indicative of independent community ambulator    Stairs Stairs: Yes Stairs assistance: Modified independent (Device/Increase time) Stair Management: One rail Left;Alternating pattern;Forwards Number of Stairs: 12    Wheelchair Mobility    Modified Rankin (Stroke Patients Only) Modified Rankin (Stroke Patients Only) Pre-Morbid Rankin Score: No symptoms Modified Rankin: No significant disability     Balance Overall balance assessment: Independent                                           Pertinent Vitals/Pain No current pain complaints.  Patient SOB with activity SpO2 93% amb on room air and HR 82     Home Living Family/patient expects to be discharged to:: Private residence Living Arrangements: Parent Available Help at Discharge: Family Type of Home:  (may be moving to apartment)         Home Equipment: None      Prior Function Level of Independence: Independent               Hand Dominance   Dominant Hand: Right    Extremity/Trunk Assessment   Upper Extremity Assessment: LUE deficits/detail       LUE Deficits / Details: WFL but difficulty opposing thumb to 4th and 5th digit.   Lower Extremity Assessment: Overall WFL for tasks assessed         Communication   Communication: No difficulties  Cognition Arousal/Alertness: Awake/alert Behavior During Therapy: WFL for tasks assessed/performed Overall Cognitive Status: Within Functional Limits for tasks assessed                      General Comments      Exercises        Assessment/Plan    PT Assessment Patent does not need any further PT services  PT Diagnosis     PT Problem List    PT Treatment Interventions     PT Goals (Current goals can be found in the Care Plan section) Acute Rehab PT Goals PT Goal Formulation: No goals set, d/c therapy    Frequency     Barriers to discharge  Co-evaluation               End of Session   Activity Tolerance: Patient tolerated treatment well Patient left: in bed Nurse Communication: Mobility status         Time: 9147-8295 PT Time Calculation (min): 18 min   Charges:   PT Evaluation $Initial PT Evaluation Tier I: 1 Procedure PT Treatments $Self Care/Home Management: 8-22   PT G Codes:          WYNN,CYNDI 05-20-14, 1:14 PM  Magda Kiel, [place] 05/20/14

## 2014-04-29 NOTE — Progress Notes (Signed)
Pt. Was placed on BIPAP 12/6 per pt. Comfort via FFM (what pt. Wears at home) with 2L O2 bled in. Pt. Is tolerating BIPAP well at this time without any complications.

## 2014-04-29 NOTE — Discharge Summary (Signed)
Physician Discharge Summary  [name] EGB:[id number] DOB: 10-29-1961 DOA: 04/27/2014  PCP: Purvis Kilts, MD  Admit date: 04/27/2014 Discharge date: 04/29/2014  Time spent: greater than 30 minutes  Discharge Diagnoses:  Principal Problem:   CVA (cerebral infarction) Active Problems:   HYPERLIPIDEMIA   Morbid obesity   OBSTRUCTIVE SLEEP APNEA   HYPERTENSION   CORONARY ARTERY DISEASE   PAROXYSMAL ATRIAL FIBRILLATION   GERD   Barrett's esophagus   Depression   Aphasia   Diabetes mellitus, type 2   Obesity, unspecified   Discharge Condition: stable  Filed Weights   04/27/14 1859  Weight: 136.533 kg (301 lb)    History of present illness:  84 y old right handed male with hx of sleep apnea, HTN, CAD s/p 2 cardiac stents on ASA and Effient, depression and anxiety on Lexapro and Valium, obesity, PAF, tobacco and significant alcohol use (4 hard drinks/day), presents to the ER tonight with expressive aphasia ictus greater than 24 hours ago, along with right upper extremity clumsiness. He still has aphasia upon presentation in the ER. No HA, weakness, but a little ataxia. He had no facial droop or diplopia. In the ER, work up included a negative head CT, EKG with NSR and QTc of 510 ms, normal renal fx tests, and unremarkable Hb. Hospitalist was asked to admit him for acute CVA. Not a TPA candidate due to mild symptoms and delay in presentation  Hospital Course:  Transfer to [facility] from [facility], as no MRI available at [facility] on weekends. Neuro exam sigificant only for nonfluent, dysarthric speech.  Admitted to telemetry. Neurology consulted.  Stroke workup negative.  Speech difficulty likely TIA vs. Anxiety related. PTA was on asa and effient.  Last coronary stent 2/14.  Neurology recommended considering eliquis for secondary stroke prevention, due to h/o PAF. Remained in NSR here.  Discussed with Pt's cardiologist, Dr. Raynelle Fanning with [facility] at [facility]. Ok to stop effient. Recommends low dose  ASA in addition to elequis.  Discussed with patient need to avoid alcohol.  Procedures:  none  Consultations:  neurology  Discharge Exam: Filed Vitals:   04/29/14 1039  BP: 122/81  Pulse: 66  Temp: 97.4 F (36.3 C)  Resp: 18    General: alert, oriented. appropriate Cardiovascular: RRR without MGR Respiratory: CTA without WRR Ext no CCE Neuro.  Speech more fluent without dysarthria. Cranial nerves, motor strength intact.  Discharge Instructions   Diet - low sodium heart healthy    Complete by:  As directed      Diet Carb Modified    Complete by:  As directed      Discharge instructions    Complete by:  As directed   Abstain from alcohol. Change aspirin to 81 mg ONCE daily. STOP PRASUGREL (EFFIENT)     Increase activity slowly    Complete by:  As directed             Medication List    STOP taking these medications       prasugrel 10 MG Tabs tablet  Commonly known as:  EFFIENT      TAKE these medications       apixaban 5 MG Tabs tablet  Commonly known as:  ELIQUIS  Take 1 tablet (5 mg total) by mouth 2 (two) times daily.     aspirin EC 81 MG tablet  Take 1 tablet (81 mg total) by mouth daily.     diazepam 5 MG tablet  Commonly known as:  VALIUM  Take 5 mg by mouth 2 (two) times daily.     escitalopram 10 MG tablet  Commonly known as:  LEXAPRO  Take 10 mg by mouth daily.     furosemide 40 MG tablet  Commonly known as:  LASIX  Take 80 mg by mouth every morning.     isosorbide mononitrate 60 MG 24 hr tablet  Commonly known as:  IMDUR  Take 1 tablet (60 mg total) by mouth 3 (three) times daily.     lisinopril 5 MG tablet  Commonly known as:  PRINIVIL,ZESTRIL  Take 5 mg by mouth every morning.     metoprolol 100 MG tablet  Commonly known as:  LOPRESSOR  Take 100 mg by mouth 2 (two) times daily.     nitroGLYCERIN 0.4 MG/SPRAY spray  Commonly known as:  NITROLINGUAL  Place 1 spray under the tongue every 5 (five) minutes x 3 doses as needed for  chest pain.     pantoprazole 40 MG tablet  Commonly known as:  PROTONIX  Take 40 mg by mouth daily.     potassium chloride SA 20 MEQ tablet  Commonly known as:  K-DUR,KLOR-CON  Take 1 tablet (20 mEq total) by mouth 2 (two) times daily.     ranolazine 1000 MG SR tablet  Commonly known as:  RANEXA  Take 1 tablet (1,000 mg total) by mouth 2 (two) times daily.     rosuvastatin 20 MG tablet  Commonly known as:  CRESTOR  Take 1 tablet (20 mg total) by mouth daily. Needs appointment for further refills     sitaGLIPtin 100 MG tablet  Commonly known as:  JANUVIA  Take 100 mg by mouth every morning.     TRILIPIX 135 MG capsule  Generic drug:  Choline Fenofibrate  Take 135 mg by mouth daily.       Allergies  Allergen Reactions  . Contrast Media [Iodinated Diagnostic Agents]   . Iohexol      Desc: PT. STATES HEART STOPPED HAS TO BE PREMED.        Follow-up Information   Follow up with Purvis Kilts, MD In 3 weeks.   Specialty:  Family Medicine   Contact information:   [address] [place] [address] [phone]        The results of significant diagnostics from this hospitalization (including imaging, microbiology, ancillary and laboratory) are listed below for reference.    Significant Diagnostic Studies: Ct Head (brain) Wo Contrast  04/27/2014   CLINICAL DATA:  Altered mental status.  EXAM: CT HEAD WITHOUT CONTRAST  TECHNIQUE: Contiguous axial images were obtained from the base of the skull through the vertex without intravenous contrast.  COMPARISON:  None.  FINDINGS: Bony calvarium appears intact. No mass effect or midline shift is noted. Ventricular size is within normal limits. There is no evidence of mass lesion, hemorrhage or acute infarction.  IMPRESSION: Normal head CT.   Electronically Signed   By: Sabino Dick M.D.   On: 04/27/2014 20:12   Mri Brain Without Contrast  04/28/2014   CLINICAL DATA:  53 year old male with abnormal speech beginning  on Thursday. Difficulty using the right upper extremity at that time. Symptoms regressing. Initial encounter.  EXAM: MRI HEAD WITHOUT CONTRAST  MRA HEAD WITHOUT CONTRAST  TECHNIQUE: Multiplanar, multiecho pulse sequences of the brain and surrounding structures were obtained without intravenous contrast. Angiographic images of the head were obtained using MRA technique without contrast.  COMPARISON:  Head CT without contrast 04/27/2014.  FINDINGS:  MRI HEAD FINDINGS  No restricted diffusion to suggest acute infarction. No midline shift, mass effect, evidence of mass lesion, ventriculomegaly, extra-axial collection or acute intracranial hemorrhage. Cervicomedullary junction and pituitary are within normal limits. Major intracranial vascular flow voids are preserved, with dominant appearing distal left vertebral artery.  Pearline Cables and white matter signal is within normal limits throughout the brain. Negative visualized cervical spine. Visible internal auditory structures appear normal. Mastoids are clear. Normal bone marrow signal.  Minor paranasal sinus mucosal thickening. Visualized orbit soft tissues are within normal limits. Large body habitus. Visualized scalp soft tissues are within normal limits.  MRA HEAD FINDINGS  Antegrade flow in the posterior circulation. Dominant distal left vertebral artery. The right functionally terminates in PICA. Normal left PICA origin. Tortuous but otherwise normal basilar artery. SCA and right PCA origins are normal. Fetal left PCA origin. Right posterior communicating artery is diminutive or absent. Bilateral PCA branches are within normal limits.  Antegrade flow in both ICA siphons. Tortuous distal cervical right ICA. No ICA siphon stenosis. Ophthalmic and left posterior communicating artery origins are within normal limits. Normal carotid termini, MCA and ACA origins.  Anterior communicating artery and visualized ACA branches are within normal limits. Median artery of the corpus  callosum. Visualized bilateral MCA branches are within normal limits.  IMPRESSION: 1. No acute infarct identified. Negative non contrast MRI appearance of the brain. 2.  Negative intracranial MRA.   Electronically Signed   By: Lars Pinks M.D.   On: 04/28/2014 17:42   Mr Jodene Nam Head/brain Wo Cm  04/28/2014   CLINICAL DATA:  53 year old male with abnormal speech beginning on Thursday. Difficulty using the right upper extremity at that time. Symptoms regressing. Initial encounter.  EXAM: MRI HEAD WITHOUT CONTRAST  MRA HEAD WITHOUT CONTRAST  TECHNIQUE: Multiplanar, multiecho pulse sequences of the brain and surrounding structures were obtained without intravenous contrast. Angiographic images of the head were obtained using MRA technique without contrast.  COMPARISON:  Head CT without contrast 04/27/2014.  FINDINGS: MRI HEAD FINDINGS  No restricted diffusion to suggest acute infarction. No midline shift, mass effect, evidence of mass lesion, ventriculomegaly, extra-axial collection or acute intracranial hemorrhage. Cervicomedullary junction and pituitary are within normal limits. Major intracranial vascular flow voids are preserved, with dominant appearing distal left vertebral artery.  Pearline Cables and white matter signal is within normal limits throughout the brain. Negative visualized cervical spine. Visible internal auditory structures appear normal. Mastoids are clear. Normal bone marrow signal.  Minor paranasal sinus mucosal thickening. Visualized orbit soft tissues are within normal limits. Large body habitus. Visualized scalp soft tissues are within normal limits.  MRA HEAD FINDINGS  Antegrade flow in the posterior circulation. Dominant distal left vertebral artery. The right functionally terminates in PICA. Normal left PICA origin. Tortuous but otherwise normal basilar artery. SCA and right PCA origins are normal. Fetal left PCA origin. Right posterior communicating artery is diminutive or absent. Bilateral PCA branches  are within normal limits.  Antegrade flow in both ICA siphons. Tortuous distal cervical right ICA. No ICA siphon stenosis. Ophthalmic and left posterior communicating artery origins are within normal limits. Normal carotid termini, MCA and ACA origins.  Anterior communicating artery and visualized ACA branches are within normal limits. Median artery of the corpus callosum. Visualized bilateral MCA branches are within normal limits.  IMPRESSION: 1. No acute infarct identified. Negative non contrast MRI appearance of the brain. 2.  Negative intracranial MRA.   Electronically Signed   By: Lezlie Octave.D.  On: 04/28/2014 17:42   Ekg: NSR with early repol, qt prolongation, repolarization abnormality  Echo  - Left ventricle: The cavity size was normal. Wall thickness was normal. Systolic function was normal. The estimated ejection fraction was in the range of 55% to 60%. Features are consistent with a pseudonormal left ventricular filling pattern, with concomitant abnormal relaxation and increased filling pressure (grade 2 diastolic dysfunction). - Impressions: Tecfhnically limited study due to poor sound wave transmission.  Impressions:  - Tecfhnically limited study due to poor sound wave transmission  Carotid dopplers Bilateral: mild mixed plaque origin ICA. 1-39% ICA stenosis. Vertebral artery flow is antegrade. ICA/CCA ratio: R-0.67 L-1.0.    Microbiology: No results found for this or any previous visit (from the past 240 hour(s)).   Labs: Basic Metabolic Panel:  Recent Labs Lab 04/27/14 1937  NA 140  K 3.3*  CL 99  CO2 26  GLUCOSE 145*  BUN 7  CREATININE 1.24  CALCIUM 9.7   Liver Function Tests:  Recent Labs Lab 04/27/14 1937  AST 34  ALT 52  ALKPHOS 83  BILITOT 0.9  PROT 7.1  ALBUMIN 3.6   No results found for this basename: LIPASE, AMYLASE,  in the last 168 hours No results found for this basename: AMMONIA,  in the last 168 hours CBC:  Recent Labs Lab  04/27/14 1937  WBC 13.2*  NEUTROABS 8.8*  HGB 16.4  HCT 45.2  MCV 92.8  PLT 277   Cardiac Enzymes:  Recent Labs Lab 04/27/14 1937  TROPONINI <0.30   BNP: BNP (last 3 results)  Recent Labs  03/15/14 1805  PROBNP 27.1   CBG:  Recent Labs Lab 04/28/14 1653 04/28/14 2013 04/29/14 0009 04/29/14 0359 04/29/14 1146  GLUCAP 110* 140* 143* 145* 130*    Signed:  SULLIVAN,CORINNA L  [facility] 04/29/2014, 1:45 PM

## 2014-04-29 NOTE — Evaluation (Signed)
Occupational Therapy Evaluation Patient Details Name: [name] MRN: [id number] DOB: 1960-11-06 Today's Date: 04/29/2014    History of Present Illness 53 y.o. male a history of hypertension, hyperlipidemia, COPD, coronary artery disease, paroxysmal atrial fibrillation who presented with right arm clumsiness and difficulty speaking. MRI negative for acute infarct.   Clinical Impression   Pt moving well during evaluation.. Education provided to pt and pt verbalized understanding-No further OT needs.   Follow Up Recommendations  No OT follow up    Equipment Recommendations  None recommended by OT (pt does not want 3 in 1 for shower chair)    Recommendations for Other Services       Precautions / Restrictions Restrictions Weight Bearing Restrictions: No      Mobility Bed Mobility               General bed mobility comments: not assessed  Transfers Overall transfer level: Modified independent                    Balance Overall balance assessment: History of Falls                                          ADL Overall ADL's : Needs assistance/impaired                     Lower Body Dressing: Modified independent;Sit to/from stand   Toilet Transfer: Ambulation;Supervision/safety       Tub/ Banker: Modified independent;Ambulation   Functional mobility during ADLs: Supervision/safety General ADL Comments: Educated on energy conservation techniques as well as safety (rugs, safe shoes, sitting for LB ADLs). Discussed using shower chair for tub transfer and also recommended holding onto wall when stepping in due to pt reporting falls at home. Pt with decreased activity tolerance-cues to slow down and to take break. Showed pt exercise he could do with left hand and told him things he could do with left hand. Educated on BE FAST stroke education and importance of getting help right away. Recommended to stop smoking and avoid  eating canned foods (due to increased sodium).     Vision  Pt has glasses   Visual fields: No apparent deficits   Tracking/Visual Pursuits: Able to track stimulus in all quads without difficulty             Perception     Praxis      Pertinent Vitals/Pain No pain reported.      Hand Dominance Right   Extremity/Trunk Assessment Upper Extremity Assessment Upper Extremity Assessment: LUE deficits/detail LUE Deficits / Details: WFL but difficulty opposing thumb to 4th and 5th digit.   Lower Extremity Assessment Lower Extremity Assessment: Overall WFL for tasks assessed       Communication Communication Communication: Expressive difficulties   Cognition Arousal/Alertness: Awake/alert Behavior During Therapy: WFL for tasks assessed/performed Overall Cognitive Status: Within Functional Limits for tasks assessed                     General Comments       Exercises       Shoulder Instructions      Home Living Family/patient expects to be discharged to:: Private residence Living Arrangements: Parent Available Help at Discharge: Family (son planning to stay for a little while) Type of Home:  (says he may be moving to apartment)  Bathroom Shower/Tub: Teacher, early years/pre: Standard            Lives With: Spouse;Other (Comment) (1 yo mother lives with them but is independent, son when he gets home from [employer])    Prior Functioning/Environment Level of Independence: Independent             OT Diagnosis:     OT Problem List:     OT Treatment/Interventions:      OT Goals(Current goals can be found in the care plan section)    OT Frequency:     Barriers to D/C:            Co-evaluation              End of Session Nurse Communication: Mobility status  Activity Tolerance: Patient tolerated treatment well;Patient limited by fatigue Patient left: in bed;with family/visitor present   Time:  1111-1135 OT Time Calculation (min): 24 min Charges:  OT General Charges $OT Visit: 1 Procedure OT Evaluation $Initial OT Evaluation Tier I: 1 Procedure OT Treatments $Therapeutic Activity: 8-22 mins G-CodesBenito Mccreedy OTR/L [phone] 04/29/2014, 12:04 PM

## 2014-04-29 NOTE — Progress Notes (Signed)
Speech Language Pathology Treatment: Dysphagia  Patient Details Name: [name] MRN: [id number] DOB: 1961/08/30 Today's Date: 04/29/2014 Time: 0750-0805 SLP Time Calculation (min): 15 min  Assessment / Plan / Recommendation Clinical Impression  Pt observed during breakfast, swallow was timely without clinical indications of airway compromise or dysphagia.  Pt admits to premorbid xerostomia, occasional choking on saliva, and sensation of food lodging in esophagus that he clears with liquid swallows.    SLP provided pt with compensation strategies for xerostomia and eating/drinking with respiratory issues.  Using teach back reinforced information and provided it in writing.  No further SLP for swallowing indicated as pt with baseline ability and educated.    HPI HPI: [name] is a 74 y old right handed  male with hx of sleep apnea, HTN, CAD s/p 2 cardiac stents on ASA and Effient, depression and anxiety on Lexapro and Valium, obesity, PAF, tobacco and significant alcohol use (4 hard drinks/day), presents to the ER tonight with expressive aphasia ictus greater than 24 hours ago, along with right upper extremity clumsiness.  He still has aphasia upon presentation in the ER.  No HA, weakness, but a little ataxia.  He had no facial droop or diplopia.  In the ER, work up included a negative head CT, EKG with NSR and QTc of 510 ms, normal renal fx tests, and unremarkable Hb.  Hospitalist was asked to admit him for acute CVA.   MRI negative for acute event.     Pertinent Vitals Afebrile,decreased  SLP Plan  All goals met    Recommendations Diet recommendations: Regular;Thin liquid Medication Administration: Whole meds with liquid Supervision: Patient able to self feed Compensations: Slow rate;Small sips/bites Postural Changes and/or Swallow Maneuvers: Out of bed for meals;Seated upright 90 degrees;Upright 30-60 min after meal              Oral Care Recommendations: Oral care Q4  per protocol Follow up Recommendations: None Plan: All goals met    [place], [place], [place] [facility] [place]

## 2014-04-30 MED ORDER — APIXABAN 5 MG PO TABS: 5.0000 mg | ORAL_TABLET | Freq: Two times a day (BID) | ORAL | Status: DC

## 2014-05-01 LAB — GLUCOSE, CAPILLARY: Glucose-Capillary: 127 mg/dL — ABNORMAL HIGH (ref 70–99)

## 2014-05-16 ENCOUNTER — Ambulatory Visit (INDEPENDENT_AMBULATORY_CARE_PROVIDER_SITE_OTHER): Payer: BC Managed Care – PPO | Admitting: Otolaryngology

## 2014-05-16 DIAGNOSIS — R490 Dysphonia: Secondary | ICD-10-CM

## 2014-05-16 DIAGNOSIS — K219 Gastro-esophageal reflux disease without esophagitis: Secondary | ICD-10-CM

## 2014-05-20 DIAGNOSIS — Z719 Counseling, unspecified: Secondary | ICD-10-CM | POA: Diagnosis not present

## 2014-05-20 DIAGNOSIS — Z6841 Body Mass Index (BMI) 40.0 and over, adult: Secondary | ICD-10-CM | POA: Diagnosis not present

## 2014-05-20 DIAGNOSIS — I2510 Atherosclerotic heart disease of native coronary artery without angina pectoris: Secondary | ICD-10-CM | POA: Diagnosis not present

## 2014-05-20 DIAGNOSIS — G459 Transient cerebral ischemic attack, unspecified: Secondary | ICD-10-CM | POA: Diagnosis not present

## 2014-06-04 ENCOUNTER — Other Ambulatory Visit (HOSPITAL_COMMUNITY): Payer: Self-pay | Admitting: Pulmonary Disease

## 2014-06-04 DIAGNOSIS — R911 Solitary pulmonary nodule: Secondary | ICD-10-CM

## 2014-06-11 ENCOUNTER — Ambulatory Visit (HOSPITAL_COMMUNITY)
Admission: RE | Admit: 2014-06-11 | Discharge: 2014-06-11 | Disposition: A | Discharge status: Home / Self Care | Payer: BC Managed Care – PPO | Source: Ambulatory Visit | Attending: Pulmonary Disease | Admitting: Pulmonary Disease

## 2014-06-11 ENCOUNTER — Other Ambulatory Visit (HOSPITAL_COMMUNITY): Payer: Self-pay | Admitting: Pulmonary Disease

## 2014-06-11 DIAGNOSIS — R911 Solitary pulmonary nodule: Secondary | ICD-10-CM

## 2014-07-21 ENCOUNTER — Other Ambulatory Visit: Payer: Self-pay | Admitting: Gastroenterology

## 2014-07-21 ENCOUNTER — Other Ambulatory Visit: Payer: Self-pay | Admitting: Cardiovascular Disease

## 2014-07-22 NOTE — Telephone Encounter (Signed)
Rx was sent to pharmacy electronically. Patient needs an appointment.

## 2014-08-14 ENCOUNTER — Encounter: Payer: Self-pay | Admitting: Gastroenterology

## 2014-08-23 DIAGNOSIS — E782 Mixed hyperlipidemia: Secondary | ICD-10-CM | POA: Diagnosis not present

## 2014-08-23 DIAGNOSIS — E119 Type 2 diabetes mellitus without complications: Secondary | ICD-10-CM | POA: Diagnosis not present

## 2014-08-23 DIAGNOSIS — Z23 Encounter for immunization: Secondary | ICD-10-CM | POA: Diagnosis not present

## 2014-08-23 DIAGNOSIS — I10 Essential (primary) hypertension: Secondary | ICD-10-CM | POA: Diagnosis not present

## 2014-08-23 DIAGNOSIS — Z6841 Body Mass Index (BMI) 40.0 and over, adult: Secondary | ICD-10-CM | POA: Diagnosis not present

## 2014-09-10 ENCOUNTER — Ambulatory Visit (HOSPITAL_COMMUNITY): Payer: BC Managed Care – PPO

## 2014-09-16 ENCOUNTER — Ambulatory Visit (HOSPITAL_COMMUNITY)
Admission: RE | Admit: 2014-09-16 | Discharge: 2014-09-16 | Disposition: A | Discharge status: Home / Self Care | Payer: BC Managed Care – PPO | Source: Ambulatory Visit | Attending: Pulmonary Disease | Admitting: Pulmonary Disease

## 2014-09-16 ENCOUNTER — Encounter (HOSPITAL_COMMUNITY): Payer: Self-pay

## 2014-09-16 DIAGNOSIS — R911 Solitary pulmonary nodule: Secondary | ICD-10-CM | POA: Insufficient documentation

## 2014-09-16 DIAGNOSIS — Z951 Presence of aortocoronary bypass graft: Secondary | ICD-10-CM | POA: Insufficient documentation

## 2014-09-17 DIAGNOSIS — J449 Chronic obstructive pulmonary disease, unspecified: Secondary | ICD-10-CM | POA: Diagnosis not present

## 2014-09-17 DIAGNOSIS — I2510 Atherosclerotic heart disease of native coronary artery without angina pectoris: Secondary | ICD-10-CM | POA: Diagnosis not present

## 2014-09-17 DIAGNOSIS — I509 Heart failure, unspecified: Secondary | ICD-10-CM | POA: Diagnosis not present

## 2014-09-17 DIAGNOSIS — J984 Other disorders of lung: Secondary | ICD-10-CM | POA: Diagnosis not present

## 2014-09-19 ENCOUNTER — Other Ambulatory Visit (HOSPITAL_COMMUNITY): Payer: Self-pay | Admitting: Family Medicine

## 2014-09-19 ENCOUNTER — Ambulatory Visit (HOSPITAL_COMMUNITY)
Admission: RE | Admit: 2014-09-19 | Discharge: 2014-09-19 | Disposition: A | Discharge status: Home / Self Care | Payer: BC Managed Care – PPO | Source: Ambulatory Visit | Attending: Family Medicine | Admitting: Family Medicine

## 2014-09-19 DIAGNOSIS — M7989 Other specified soft tissue disorders: Secondary | ICD-10-CM | POA: Diagnosis not present

## 2014-09-19 DIAGNOSIS — S99912A Unspecified injury of left ankle, initial encounter: Secondary | ICD-10-CM | POA: Diagnosis not present

## 2014-09-19 DIAGNOSIS — M25472 Effusion, left ankle: Secondary | ICD-10-CM | POA: Diagnosis not present

## 2014-09-19 DIAGNOSIS — W19XXXA Unspecified fall, initial encounter: Secondary | ICD-10-CM

## 2014-09-19 DIAGNOSIS — M79605 Pain in left leg: Secondary | ICD-10-CM

## 2014-09-19 DIAGNOSIS — Z6841 Body Mass Index (BMI) 40.0 and over, adult: Secondary | ICD-10-CM | POA: Diagnosis not present

## 2014-09-19 DIAGNOSIS — S93402A Sprain of unspecified ligament of left ankle, initial encounter: Secondary | ICD-10-CM | POA: Diagnosis not present

## 2014-09-19 DIAGNOSIS — M25572 Pain in left ankle and joints of left foot: Secondary | ICD-10-CM | POA: Diagnosis not present

## 2014-10-10 ENCOUNTER — Encounter (HOSPITAL_COMMUNITY): Payer: Self-pay | Admitting: Cardiovascular Disease

## 2014-11-14 ENCOUNTER — Ambulatory Visit (INDEPENDENT_AMBULATORY_CARE_PROVIDER_SITE_OTHER): Payer: BLUE CROSS/BLUE SHIELD | Admitting: Otolaryngology

## 2014-11-14 DIAGNOSIS — R1312 Dysphagia, oropharyngeal phase: Secondary | ICD-10-CM

## 2014-11-14 DIAGNOSIS — R490 Dysphonia: Secondary | ICD-10-CM | POA: Diagnosis not present

## 2014-12-04 ENCOUNTER — Other Ambulatory Visit (HOSPITAL_COMMUNITY): Payer: Self-pay | Admitting: Pulmonary Disease

## 2014-12-04 DIAGNOSIS — R911 Solitary pulmonary nodule: Secondary | ICD-10-CM

## 2014-12-18 ENCOUNTER — Ambulatory Visit (HOSPITAL_COMMUNITY)
Admission: RE | Admit: 2014-12-18 | Discharge: 2014-12-18 | Disposition: A | Discharge status: Home / Self Care | Payer: BLUE CROSS/BLUE SHIELD | Source: Ambulatory Visit | Attending: Pulmonary Disease | Admitting: Pulmonary Disease

## 2014-12-18 DIAGNOSIS — Z87891 Personal history of nicotine dependence: Secondary | ICD-10-CM | POA: Insufficient documentation

## 2014-12-18 DIAGNOSIS — J432 Centrilobular emphysema: Secondary | ICD-10-CM | POA: Insufficient documentation

## 2014-12-18 DIAGNOSIS — R918 Other nonspecific abnormal finding of lung field: Secondary | ICD-10-CM | POA: Insufficient documentation

## 2014-12-18 DIAGNOSIS — R911 Solitary pulmonary nodule: Secondary | ICD-10-CM

## 2014-12-25 DIAGNOSIS — E782 Mixed hyperlipidemia: Secondary | ICD-10-CM | POA: Diagnosis not present

## 2014-12-25 DIAGNOSIS — I10 Essential (primary) hypertension: Secondary | ICD-10-CM | POA: Diagnosis not present

## 2014-12-25 DIAGNOSIS — E119 Type 2 diabetes mellitus without complications: Secondary | ICD-10-CM | POA: Diagnosis not present

## 2014-12-25 DIAGNOSIS — Z6841 Body Mass Index (BMI) 40.0 and over, adult: Secondary | ICD-10-CM | POA: Diagnosis not present

## 2014-12-26 ENCOUNTER — Ambulatory Visit (INDEPENDENT_AMBULATORY_CARE_PROVIDER_SITE_OTHER): Payer: BLUE CROSS/BLUE SHIELD | Admitting: Otolaryngology

## 2014-12-26 DIAGNOSIS — R490 Dysphonia: Secondary | ICD-10-CM

## 2014-12-26 DIAGNOSIS — K219 Gastro-esophageal reflux disease without esophagitis: Secondary | ICD-10-CM | POA: Diagnosis not present

## 2015-01-03 ENCOUNTER — Other Ambulatory Visit: Payer: Self-pay | Admitting: Gastroenterology

## 2015-01-23 DIAGNOSIS — Z951 Presence of aortocoronary bypass graft: Secondary | ICD-10-CM | POA: Diagnosis not present

## 2015-01-23 DIAGNOSIS — Z8679 Personal history of other diseases of the circulatory system: Secondary | ICD-10-CM | POA: Diagnosis not present

## 2015-02-11 ENCOUNTER — Other Ambulatory Visit: Payer: Self-pay | Admitting: Physician Assistant

## 2015-02-27 ENCOUNTER — Ambulatory Visit (INDEPENDENT_AMBULATORY_CARE_PROVIDER_SITE_OTHER): Payer: BLUE CROSS/BLUE SHIELD | Admitting: Otolaryngology

## 2015-02-27 DIAGNOSIS — R490 Dysphonia: Secondary | ICD-10-CM

## 2015-02-27 DIAGNOSIS — K219 Gastro-esophageal reflux disease without esophagitis: Secondary | ICD-10-CM | POA: Diagnosis not present

## 2015-03-20 ENCOUNTER — Other Ambulatory Visit: Payer: Self-pay | Admitting: Physician Assistant

## 2015-03-21 NOTE — Telephone Encounter (Signed)
Rx(s) sent to pharmacy electronically.  

## 2015-03-27 ENCOUNTER — Other Ambulatory Visit (HOSPITAL_COMMUNITY): Payer: Self-pay | Admitting: Family Medicine

## 2015-03-27 ENCOUNTER — Ambulatory Visit (HOSPITAL_COMMUNITY)
Admission: RE | Admit: 2015-03-27 | Discharge: 2015-03-27 | Disposition: A | Discharge status: Home / Self Care | Payer: BLUE CROSS/BLUE SHIELD | Source: Ambulatory Visit | Attending: Family Medicine | Admitting: Family Medicine

## 2015-03-27 DIAGNOSIS — R0602 Shortness of breath: Secondary | ICD-10-CM | POA: Diagnosis not present

## 2015-03-27 DIAGNOSIS — J449 Chronic obstructive pulmonary disease, unspecified: Secondary | ICD-10-CM | POA: Insufficient documentation

## 2015-03-27 DIAGNOSIS — R05 Cough: Secondary | ICD-10-CM | POA: Diagnosis not present

## 2015-04-01 ENCOUNTER — Other Ambulatory Visit (HOSPITAL_COMMUNITY): Payer: Self-pay | Admitting: Pulmonary Disease

## 2015-04-01 ENCOUNTER — Telehealth: Payer: Self-pay | Admitting: Gastroenterology

## 2015-04-01 DIAGNOSIS — R911 Solitary pulmonary nodule: Secondary | ICD-10-CM

## 2015-04-01 NOTE — Telephone Encounter (Signed)
Routing to Dr. Fields to advise! 

## 2015-04-01 NOTE — Telephone Encounter (Signed)
Please came to front window this morning saying that he has a new insurance and he had a colonoscopy done years ago. He is on the recall for his next colonoscopy for 2019 but he is saying that with his new insurance he needs to have one done by July 1st or he will have to pay. He did say that at his last colonoscopy he had several polyps removed. Please advise if I can go ahead and make him an OV and can we go ahead with scheduling colonoscopy due to insurance changes?  [phone] or [phone] patient of [facility]

## 2015-04-02 NOTE — Telephone Encounter (Signed)
PLEASE CALL PT. I REVIEWED HIS ENDOSCOPY AND PATH REPORTS FROM 2009 TO 2016. HIS LAST TCS WAS IN 2009. HE NEED TO SEND HIS INSURANCE CO A COPY OF HIS REPORT. HIS TCS IS UP TO DATE. HE IS PAST DUE FOR HIS REPEAT EGD WHICH SHOULD HAVE BEEN DONE IN NOV 2015. HE NEEDS A EGD W/ MAC FOR BARRETT'S SCREENING.

## 2015-04-03 ENCOUNTER — Ambulatory Visit (HOSPITAL_COMMUNITY)
Admission: RE | Admit: 2015-04-03 | Discharge: 2015-04-03 | Disposition: A | Discharge status: Home / Self Care | Payer: BLUE CROSS/BLUE SHIELD | Source: Ambulatory Visit | Attending: Pulmonary Disease | Admitting: Pulmonary Disease

## 2015-04-03 DIAGNOSIS — R911 Solitary pulmonary nodule: Secondary | ICD-10-CM | POA: Diagnosis not present

## 2015-04-03 NOTE — Telephone Encounter (Signed)
Pt is aware. Has OV with Walden Field, NP on 04/07/2015 and I have held a slot on 04/22/2015 in OR for EGD since pt wants to make sure he gets it done before July 1st due to insurance.

## 2015-04-07 ENCOUNTER — Other Ambulatory Visit: Payer: Self-pay

## 2015-04-07 ENCOUNTER — Ambulatory Visit (INDEPENDENT_AMBULATORY_CARE_PROVIDER_SITE_OTHER): Payer: BLUE CROSS/BLUE SHIELD | Admitting: Nurse Practitioner

## 2015-04-07 ENCOUNTER — Encounter: Payer: Self-pay | Admitting: Nurse Practitioner

## 2015-04-07 VITALS — BP 142/90 | HR 72 | Temp 98.7°F | Ht 69.0 in | Wt 314.4 lb

## 2015-04-07 DIAGNOSIS — K2100 Gastro-esophageal reflux disease with esophagitis, without bleeding: Secondary | ICD-10-CM

## 2015-04-07 DIAGNOSIS — R1314 Dysphagia, pharyngoesophageal phase: Secondary | ICD-10-CM

## 2015-04-07 DIAGNOSIS — R1310 Dysphagia, unspecified: Secondary | ICD-10-CM | POA: Diagnosis not present

## 2015-04-07 DIAGNOSIS — K2270 Barrett's esophagus without dysplasia: Secondary | ICD-10-CM

## 2015-04-07 DIAGNOSIS — K219 Gastro-esophageal reflux disease without esophagitis: Secondary | ICD-10-CM

## 2015-04-07 NOTE — Patient Instructions (Signed)
1. We will schedule your procedure (endoscopy) for you. 2. Continue taking her Protonix as prescribed. 3. Further recommendations to be based on results your procedure.

## 2015-04-07 NOTE — Progress Notes (Signed)
Referring Provider: Sharilyn Sites, MD Primary Care Physician:  Purvis Kilts, MD Primary GI:  Dr. Oneida Alar  Chief Complaint  Patient presents with  . set up EGD    HPI:   54 year old male presents for surveillance EGD for Barrett's screening as per recommendation from Dr. Oneida Alar. He was due in November 2015. Last endoscopy completed 09/06/2011 found Barrett's esophagus in the distal esophagus, esophagitis the distal esophagus, moderate gastritis, duodenitis. Has been on Protonix. Biopsies of the stomach found mild chronic inflammation, esophageal biopsy with goblet cell metaplasia.   Today he states he is taking Protonix daily. Does have some rare breakthrough GERD symptoms. Was having frequent choking, saw ENT who determined it was from muscles spasms and started him on valium. Is still having symptoms, however. Is also having some dysphagia symptoms with food, pills, and liquids. Denies abdominal pain, vomiting, hematemesis, hematochezia, melena. Admits some nausea. Admits abdominal hernia. Denies any other upper or lower GI symptoms. He states he has a "mini stroke" (TIA) with temporary aphasia and was started on Eliquis for prevention.  Past Medical History  Diagnosis Date  . CAD (coronary artery disease) 2008    bypass grafting   . Morbid obesity   . PAF (paroxysmal atrial fibrillation)   . HTN (hypertension)   . COPD (chronic obstructive pulmonary disease)   . Hyperlipidemia   . Sleep apnea   . S/P colonoscopy 2009    3-4 mm transverse colon erosions likely secondary to  ASA  . S/P endoscopy Dec 2011    moderate erosive gastritis, Barrett's esophagus 1-2cm  . Myocardial infarction 2008,2009,2009  . Chronic kidney disease     hx of kidney stones  . GERD (gastroesophageal reflux disease)   . Barrett's esophagus   . Depression   . Glaucoma   . SOB (shortness of breath) 11/03/2007    2D Echo EF 50%-55%  . Abnormal myocardial perfusion study 01/01/2011    there a  small to moderate sized inferobasal scar  . OSA (obstructive sleep apnea)     on cpap  . Diabetes     type 2 diabetes mellitus  . Claudication 11/16/2011    PV test perform shows normal  . Cataracts, bilateral   . Dysrhythmia   . Hernia of abdominal wall     Past Surgical History  Procedure Laterality Date  . Cabg x 4  03/2008  . Hernia repair      ventral hernia repair  . Coronary artery bypass graft  2008    4 vessels  . Cardiac catheterization  2009    stent placement to the left circumflex a 2.25   . Savory dilation  09/06/2011    Procedure: SAVORY DILATION;  Surgeon: Dorothyann Peng, MD;  Location: [facility];  Service: Endoscopy;  Laterality: N/A;  Dilated with 6m  . Esophagogastroduodenoscopy  09/2011    Barrett's esophagus, no dysplasia on biopsies. Distal esophagitis. Status post dilation. Moderate gastritis and duodenitis, but biopsies benign. Next EGD in November 2015 for surveillance of Barrett's esophagus.  . Cardiac catheterization  07/08/2010  . Coronary stent placement  12/29/12  . Coronary stent placement  12/2012  . Left heart catheterization with coronary/graft angiogram N/A 12/29/2012    Procedure: LEFT HEART CATHETERIZATION WITH CBeatrix Fetters  Surgeon: TTroy Sine MD;  [facility];  Service: Cardiovascular;  Laterality: N/A;  . Percutaneous coronary stent intervention (pci-s)  12/29/2012    Procedure: PERCUTANEOUS CORONARY STENT INTERVENTION (PCI-S);  Surgeon: TMarcello Moores  Floyce Stakes, MD;  Location: [facility] CATH LAB;  Service: Cardiovascular;;    Current Outpatient Prescriptions  Medication Sig Dispense Refill  . apixaban (ELIQUIS) 5 MG TABS tablet Take 1 tablet (5 mg total) by mouth 2 (two) times daily. 60 tablet 0  . Choline Fenofibrate (TRILIPIX) 135 MG capsule Take 135 mg by mouth daily.    . diazepam (VALIUM) 5 MG tablet Take 5 mg by mouth 2 (two) times daily.    Marland Kitchen escitalopram (LEXAPRO) 10 MG tablet Take 10 mg by mouth daily.    . furosemide  (LASIX) 40 MG tablet Take 80 mg by mouth every morning.    . isosorbide mononitrate (IMDUR) 60 MG 24 hr tablet Take 1 tablet (60 mg total) by mouth 3 (three) times daily. Please make an appointment for additional refills 45 tablet 0  . lisinopril (PRINIVIL,ZESTRIL) 5 MG tablet Take 5 mg by mouth every morning.     . metoprolol (LOPRESSOR) 50 MG tablet Take 0.5 tablets (25 mg total) by mouth 2 (two) times daily. NEED APPOINTMENT FOR FUTURE REFILLS. 15 tablet 0  . nitroGLYCERIN (NITROLINGUAL) 0.4 MG/SPRAY spray Place 1 spray under the tongue every 5 (five) minutes x 3 doses as needed for chest pain.    . pantoprazole (PROTONIX) 40 MG tablet Take 40 mg by mouth daily.    . pantoprazole (PROTONIX) 40 MG tablet TAKE 1 TABLET BY MOUTH 30 MINUTES PRIOR TO BREAKFAST 30 tablet 3  . potassium chloride SA (K-DUR,KLOR-CON) 20 MEQ tablet Take 1 tablet (20 mEq total) by mouth 2 (two) times daily. 30 tablet 0  . ranolazine (RANEXA) 1000 MG SR tablet Take 1 tablet (1,000 mg total) by mouth 2 (two) times daily. NEED APPOINTMENT FOR FUTURE REFILLS 30 tablet 0  . rosuvastatin (CRESTOR) 20 MG tablet Take 1 tablet (20 mg total) by mouth daily. Needs appointment for further refills 30 tablet 0  . sitaGLIPtin (JANUVIA) 100 MG tablet Take 100 mg by mouth every morning.     Marland Kitchen aspirin EC 81 MG tablet Take 1 tablet (81 mg total) by mouth daily. (Patient not taking: Reported on 04/07/2015)     No current facility-administered medications for this visit.    Allergies as of 04/07/2015 - Review Complete 04/07/2015  Allergen Reaction Noted  . Contrast media [iodinated diagnostic agents]  03/14/2013  . Iohexol  01/02/2008    Family History  Problem Relation Age of Onset  . Colon cancer Neg Hx   . Anesthesia problems Neg Hx   . Hypotension Neg Hx   . Malignant hyperthermia Neg Hx   . Pseudochol deficiency Neg Hx     History   Social History  . Marital Status: Divorced    Spouse Name: N/A  . Number of Children: N/A    . Years of Education: N/A   Social History Main Topics  . Smoking status: Current Every Day Smoker -- 0.50 packs/day for 30 years    Types: Cigarettes  . Smokeless tobacco: Not on file  . Alcohol Use: Yes     Comment: every other day, one drink of beer or liquor  . Drug Use: No  . Sexual Activity: Not on file   Other Topics Concern  . None   Social History Narrative    Review of Systems: 10 point ROS negative except as per HPI.   Physical Exam: BP 142/90 mmHg  Pulse 72  Temp(Src) 98.7 F (37.1 C)  Ht '5\' 9"'$  (1.753 m)  Wt 314 lb 6.4 oz (  142.611 kg)  BMI 46.41 kg/m2 General:   Alert and oriented. Pleasant and cooperative. Well-nourished and well-developed.  Head:  Normocephalic and atraumatic. Eyes:  Without icterus, sclera clear and conjunctiva pink.  Ears:  Normal auditory acuity. Cardiovascular:  S1, S2 present without murmurs appreciated. Normal pulses noted. Extremities without clubbing or edema. Respiratory:  Clear to auscultation bilaterally. No wheezes, rales, or rhonchi. No distress.  Gastrointestinal:  +BS, obese, soft, non-tender and non-distended. No HSM noted. No guarding or rebound. No masses appreciated.  Rectal:  Deferred  Skin:  Intact without significant lesions or rashes. Neurologic:  Alert and oriented x4;  grossly normal neurologically. Psych:  Alert and cooperative. Normal mood and affect. Heme/Lymph/Immune: No excessive bruising noted.    04/07/2015 8:58 AM

## 2015-04-07 NOTE — Assessment & Plan Note (Signed)
Patient has she does have a lot of choking with foods. Was evaluated by ENT who thought muscle spasms with a likely culprit and subsequently was started on Valium. However symptoms have not improved. He states he does have a sensation of things getting stuck "halfway down". His dysphagia occurs with solid foods, pills, and liquids. At this point he is due for endoscopy for surveillance of Barrett's esophagus. We can also evaluate him for possible cause of dysphagia symptoms. Therefore we'll add on a possible dilation to reschedule an endoscopy if appropriate given anticoagulation status.   Proceed with EGD +/- dilation in the OR with MAC with Dr. Oneida Alar in near future: the risks, benefits, and alternatives have been discussed with the patient in detail. The patient states understanding and desires to proceed.  Patient is on Eliquis for recent TIA with minimal to no residual symptoms.

## 2015-04-07 NOTE — Assessment & Plan Note (Signed)
GERD symptoms are generally well managed on Protonix 40 mg once daily. Has rare breakthrough symptoms. Continue current PPI therapy.

## 2015-04-07 NOTE — Assessment & Plan Note (Signed)
Patient with Barrett's esophagus last EGD 2009 and is overdue at this point for repeat screening. He does have GERD, however is well controlled on his daily PPI. States he's had this done before July 1 when his insurance requires. At this point we'll proceed with EGD in the OR propofol based on recommendations from Dr. Oneida Alar.  Proceed with EGD +/- dilation in the OR with MAC with Dr. Oneida Alar in near future: the risks, benefits, and alternatives have been discussed with the patient in detail. The patient states understanding and desires to proceed.  Patient is on Eliquis for recent TIA with minimal to no residual symptoms.

## 2015-04-16 NOTE — Progress Notes (Signed)
cc'd to pcp 

## 2015-04-22 ENCOUNTER — Encounter (HOSPITAL_COMMUNITY)
Admission: RE | Disposition: A | Discharge status: Home / Self Care | Payer: Self-pay | Source: Ambulatory Visit | Attending: Gastroenterology

## 2015-04-22 ENCOUNTER — Ambulatory Visit (HOSPITAL_COMMUNITY)
Admission: RE | Admit: 2015-04-22 | Discharge: 2015-04-22 | Disposition: A | Discharge status: Home / Self Care | Payer: BLUE CROSS/BLUE SHIELD | Source: Ambulatory Visit | Attending: Gastroenterology | Admitting: Gastroenterology

## 2015-04-22 ENCOUNTER — Encounter (HOSPITAL_COMMUNITY): Payer: Self-pay | Admitting: Anesthesiology

## 2015-04-22 ENCOUNTER — Other Ambulatory Visit: Payer: Self-pay

## 2015-04-22 ENCOUNTER — Encounter (HOSPITAL_COMMUNITY): Payer: Self-pay | Admitting: *Deleted

## 2015-04-22 DIAGNOSIS — I2510 Atherosclerotic heart disease of native coronary artery without angina pectoris: Secondary | ICD-10-CM | POA: Insufficient documentation

## 2015-04-22 DIAGNOSIS — K219 Gastro-esophageal reflux disease without esophagitis: Secondary | ICD-10-CM | POA: Diagnosis not present

## 2015-04-22 DIAGNOSIS — Z9989 Dependence on other enabling machines and devices: Secondary | ICD-10-CM | POA: Diagnosis not present

## 2015-04-22 DIAGNOSIS — F329 Major depressive disorder, single episode, unspecified: Secondary | ICD-10-CM | POA: Diagnosis not present

## 2015-04-22 DIAGNOSIS — E785 Hyperlipidemia, unspecified: Secondary | ICD-10-CM | POA: Insufficient documentation

## 2015-04-22 DIAGNOSIS — Z79899 Other long term (current) drug therapy: Secondary | ICD-10-CM | POA: Diagnosis not present

## 2015-04-22 DIAGNOSIS — Z7901 Long term (current) use of anticoagulants: Secondary | ICD-10-CM | POA: Insufficient documentation

## 2015-04-22 DIAGNOSIS — E119 Type 2 diabetes mellitus without complications: Secondary | ICD-10-CM | POA: Diagnosis not present

## 2015-04-22 DIAGNOSIS — Z951 Presence of aortocoronary bypass graft: Secondary | ICD-10-CM | POA: Diagnosis not present

## 2015-04-22 DIAGNOSIS — I252 Old myocardial infarction: Secondary | ICD-10-CM | POA: Insufficient documentation

## 2015-04-22 DIAGNOSIS — K2270 Barrett's esophagus without dysplasia: Secondary | ICD-10-CM

## 2015-04-22 DIAGNOSIS — Z5309 Procedure and treatment not carried out because of other contraindication: Secondary | ICD-10-CM | POA: Diagnosis not present

## 2015-04-22 DIAGNOSIS — F17210 Nicotine dependence, cigarettes, uncomplicated: Secondary | ICD-10-CM | POA: Insufficient documentation

## 2015-04-22 DIAGNOSIS — G4733 Obstructive sleep apnea (adult) (pediatric): Secondary | ICD-10-CM | POA: Insufficient documentation

## 2015-04-22 DIAGNOSIS — H409 Unspecified glaucoma: Secondary | ICD-10-CM | POA: Diagnosis not present

## 2015-04-22 DIAGNOSIS — R1314 Dysphagia, pharyngoesophageal phase: Secondary | ICD-10-CM | POA: Diagnosis not present

## 2015-04-22 DIAGNOSIS — Z6841 Body Mass Index (BMI) 40.0 and over, adult: Secondary | ICD-10-CM | POA: Insufficient documentation

## 2015-04-22 HISTORY — DX: Cerebral infarction, unspecified: I63.9

## 2015-04-22 LAB — BASIC METABOLIC PANEL
Anion gap: 9 (ref 5–15)
BUN: 12 mg/dL (ref 6–20)
CHLORIDE: 105 mmol/L (ref 101–111)
CO2: 26 mmol/L (ref 22–32)
CREATININE: 1.15 mg/dL (ref 0.61–1.24)
Calcium: 8.5 mg/dL — ABNORMAL LOW (ref 8.9–10.3)
Glucose, Bld: 155 mg/dL — ABNORMAL HIGH (ref 65–99)
Potassium: 3.2 mmol/L — ABNORMAL LOW (ref 3.5–5.1)
Sodium: 140 mmol/L (ref 135–145)

## 2015-04-22 LAB — CBC WITH DIFFERENTIAL/PLATELET
BASOS ABS: 0.1 10*3/uL (ref 0.0–0.1)
BASOS PCT: 1 % (ref 0–1)
EOS ABS: 0.5 10*3/uL (ref 0.0–0.7)
Eosinophils Relative: 5 % (ref 0–5)
HEMATOCRIT: 46.4 % (ref 39.0–52.0)
Hemoglobin: 16.4 g/dL (ref 13.0–17.0)
LYMPHS PCT: 28 % (ref 12–46)
Lymphs Abs: 2.9 10*3/uL (ref 0.7–4.0)
MCH: 33.8 pg (ref 26.0–34.0)
MCHC: 35.3 g/dL (ref 30.0–36.0)
MCV: 95.7 fL (ref 78.0–100.0)
Monocytes Absolute: 0.7 10*3/uL (ref 0.1–1.0)
Monocytes Relative: 6 % (ref 3–12)
Neutro Abs: 6.4 10*3/uL (ref 1.7–7.7)
Neutrophils Relative %: 60 % (ref 43–77)
Platelets: 223 10*3/uL (ref 150–400)
RBC: 4.85 MIL/uL (ref 4.22–5.81)
RDW: 12.7 % (ref 11.5–15.5)
WBC: 10.7 10*3/uL — ABNORMAL HIGH (ref 4.0–10.5)

## 2015-04-22 LAB — GLUCOSE, CAPILLARY: GLUCOSE-CAPILLARY: 153 mg/dL — AB (ref 65–99)

## 2015-04-22 SURGERY — ESOPHAGOGASTRODUODENOSCOPY (EGD) WITH PROPOFOL
Anesthesia: Monitor Anesthesia Care

## 2015-04-22 MED ORDER — LIDOCAINE VISCOUS 2 % MT SOLN
OROMUCOSAL | Status: AC
  Filled 2015-04-22: qty 15

## 2015-04-22 MED ORDER — LIDOCAINE VISCOUS 2 % MT SOLN: 15.0000 mL | Freq: Once | OROMUCOSAL | Status: DC

## 2015-04-22 MED ORDER — SODIUM CHLORIDE 0.9 % IV SOLN: INTRAVENOUS | Status: DC

## 2015-04-22 NOTE — Progress Notes (Addendum)
REVIEWED-PT TOOK ELIQUIS AT 11 AM TODAY. [facility] EGD/DIL WITH MAC FOR JUN 28. [place] JUN 25.

## 2015-04-22 NOTE — Progress Notes (Signed)
Patient did not stop eliquis for procedure. Physician unable to do procedure today. IV removed. Patient educated on taking Imdur and Metoprolol morning of procedure but to stop eliquis 72 hours before procedure as directed by Dr. Oneida Alar. Patient to go straight to Dr. Oneida Alar office today to reschedule.

## 2015-04-22 NOTE — Progress Notes (Signed)
Pt is rescheduled for 04/29/15. Instructions are in the mail

## 2015-04-23 NOTE — H&P (Signed)
Primary Care Physician:  Purvis Kilts, MD Primary Gastroenterologist:  Dr. Oneida Alar  Pre-Procedure History & Physical: HPI:  [name] is a 54 y.o. male here for SCREENING FOR BARRETT'S ESOPHAGUS/DYSPHAGIA.  Past Medical History  Diagnosis Date  . CAD (coronary artery disease) 2008    bypass grafting   . Morbid obesity   . PAF (paroxysmal atrial fibrillation)   . HTN (hypertension)   . COPD (chronic obstructive pulmonary disease)   . Hyperlipidemia   . S/P colonoscopy 2009    3-4 mm transverse colon erosions likely secondary to  ASA  . S/P endoscopy Dec 2011    moderate erosive gastritis, Barrett's esophagus 1-2cm  . Myocardial infarction 2008,2009,2009  . Chronic kidney disease     hx of kidney stones  . GERD (gastroesophageal reflux disease)   . Barrett's esophagus   . Depression   . Glaucoma   . SOB (shortness of breath) 11/03/2007    2D Echo EF 50%-55%  . Abnormal myocardial perfusion study 01/01/2011    there a small to moderate sized inferobasal scar  . Diabetes     type 2 diabetes mellitus  . Claudication 11/16/2011    PV test perform shows normal  . Cataracts, bilateral   . Dysrhythmia   . Hernia of abdominal wall   . Sleep apnea   . OSA (obstructive sleep apnea)     on cpap  . Stroke     Past Surgical History  Procedure Laterality Date  . Cabg x 4  03/2008  . Hernia repair      ventral hernia repair  . Coronary artery bypass graft  2008    4 vessels  . Cardiac catheterization  2009    stent placement to the left circumflex a 2.25   . Savory dilation  09/06/2011    Procedure: SAVORY DILATION;  Surgeon: Dorothyann Peng, MD;  Location: [facility];  Service: Endoscopy;  Laterality: N/A;  Dilated with 62m  . Esophagogastroduodenoscopy  09/2011    Barrett's esophagus, no dysplasia on biopsies. Distal esophagitis. Status post dilation. Moderate gastritis and duodenitis, but biopsies benign. Next EGD in November 2015 for surveillance of Barrett's  esophagus.  . Cardiac catheterization  07/08/2010  . Coronary stent placement  12/29/12  . Coronary stent placement  12/2012  . Left heart catheterization with coronary/graft angiogram N/A 12/29/2012    Procedure: LEFT HEART CATHETERIZATION WITH CBeatrix Fetters  Surgeon: TTroy Sine MD;  [facility];  Service: Cardiovascular;  Laterality: N/A;  . Percutaneous coronary stent intervention (pci-s)  12/29/2012    Procedure: PERCUTANEOUS CORONARY STENT INTERVENTION (PCI-S);  Surgeon: TTroy Sine MD;  [facility];  Service: Cardiovascular;;    Prior to Admission medications   Medication Sig Start Date End Date Taking? Authorizing Provider  apixaban (ELIQUIS) 5 MG TABS tablet Take 1 tablet (5 mg total) by mouth 2 (two) times daily. 04/30/14  Yes CDelfina Redwood MD  Choline Fenofibrate (TRILIPIX) 135 MG capsule Take 135 mg by mouth daily.   Yes Historical Provider, MD  diazepam (VALIUM) 5 MG tablet Take 5 mg by mouth 2 (two) times daily.   Yes Historical Provider, MD  escitalopram (LEXAPRO) 10 MG tablet Take 10 mg by mouth daily. 04/27/14  Yes Historical Provider, MD  furosemide (LASIX) 40 MG tablet Take 80 mg by mouth every morning.   Yes Historical Provider, MD  isosorbide mononitrate (IMDUR) 60 MG 24 hr tablet Take 1 tablet (60 mg total)  by mouth 3 (three) times daily. Please make an appointment for additional refills 03/21/15  Yes Mihai Croitoru, MD  lisinopril (PRINIVIL,ZESTRIL) 5 MG tablet Take 5 mg by mouth every morning.    Yes Historical Provider, MD  metoprolol (LOPRESSOR) 100 MG tablet Take 100 mg by mouth 2 (two) times daily.  08/08/14  Yes Historical Provider, MD  modafinil (PROVIGIL) 100 MG tablet Take 100 mg by mouth as directed. Can take up to twice a day   Yes Historical Provider, MD  nitroGLYCERIN (NITROLINGUAL) 0.4 MG/SPRAY spray Place 1 spray under the tongue every 5 (five) minutes x 3 doses as needed for chest pain.   Yes Historical Provider, MD   pantoprazole (PROTONIX) 40 MG tablet TAKE 1 TABLET BY MOUTH 30 MINUTES PRIOR TO BREAKFAST 01/06/15  Yes Carlis Stable, NP  potassium chloride SA (K-DUR,KLOR-CON) 20 MEQ tablet Take 1 tablet (20 mEq total) by mouth 2 (two) times daily. 07/22/14  Yes Mihai Croitoru, MD  ranolazine (RANEXA) 1000 MG SR tablet Take 1 tablet (1,000 mg total) by mouth 2 (two) times daily. NEED APPOINTMENT FOR FUTURE REFILLS 07/22/14  Yes Mihai Croitoru, MD  rosuvastatin (CRESTOR) 20 MG tablet Take 1 tablet (20 mg total) by mouth daily. Needs appointment for further refills   Yes Mihai Croitoru, MD  sitaGLIPtin (JANUVIA) 100 MG tablet Take 100 mg by mouth every morning.    Yes Historical Provider, MD  Umeclidinium-Vilanterol 62.5-25 MCG/INH AEPB Inhale 1 puff into the lungs daily as needed (wheezing).   Yes Historical Provider, MD    Allergies as of 04/07/2015 - Review Complete 04/07/2015  Allergen Reaction Noted  . Contrast media [iodinated diagnostic agents]  03/14/2013  . Iohexol  01/02/2008    Family History  Problem Relation Age of Onset  . Colon cancer Neg Hx   . Anesthesia problems Neg Hx   . Hypotension Neg Hx   . Malignant hyperthermia Neg Hx   . Pseudochol deficiency Neg Hx   . Diabetes Mother   . Heart disease Mother   . Diabetes Father   . Heart disease Father     History   Social History  . Marital Status: Divorced    Spouse Name: N/A  . Number of Children: N/A  . Years of Education: N/A   Occupational History  . Not on file.   Social History Main Topics  . Smoking status: Current Every Day Smoker -- 0.50 packs/day for 30 years    Types: Cigarettes  . Smokeless tobacco: Not on file  . Alcohol Use: Yes     Comment: every other day, one drink of beer or liquor  . Drug Use: No  . Sexual Activity: No   Other Topics Concern  . Not on file   Social History Narrative    Review of Systems: See HPI, otherwise negative ROS   Physical Exam: BP 128/82 mmHg  Pulse 68  Temp(Src) 98 F  (36.7 C) (Oral)  Resp 14  Ht '5\' 9"'$  (1.753 m)  Wt 314 lb (142.429 kg)  BMI 46.35 kg/m2  SpO2 94% General:   Alert,  pleasant and cooperative in NAD Head:  Normocephalic and atraumatic. Neck:  Supple; Lungs:  Clear throughout to auscultation.    Heart:  Regular rate and rhythm. Abdomen:  Soft, nontender and nondistended. Normal bowel sounds, without guarding, and without rebound.   Neurologic:  Alert and  oriented x4;  grossly normal neurologically.  Impression/Plan:    SCREENING FOR BARRETT'S ESOPHAGUS/DYSPHAGIA.  PLAN:  1.EGD/?  DIL NEXT TUES 2. HOLD ELIQUIS JUN 25 TODAY

## 2015-04-25 ENCOUNTER — Other Ambulatory Visit: Payer: Self-pay | Admitting: Cardiovascular Disease

## 2015-04-25 NOTE — Telephone Encounter (Signed)
Rx(s) sent to pharmacy electronically.  

## 2015-04-29 ENCOUNTER — Encounter (HOSPITAL_COMMUNITY)
Admission: RE | Disposition: A | Discharge status: Home / Self Care | Payer: Self-pay | Source: Ambulatory Visit | Attending: Gastroenterology

## 2015-04-29 ENCOUNTER — Ambulatory Visit (HOSPITAL_COMMUNITY): Payer: BLUE CROSS/BLUE SHIELD | Admitting: Anesthesiology

## 2015-04-29 ENCOUNTER — Other Ambulatory Visit: Payer: Self-pay

## 2015-04-29 ENCOUNTER — Ambulatory Visit (HOSPITAL_COMMUNITY)
Admission: RE | Admit: 2015-04-29 | Discharge: 2015-04-29 | Disposition: A | Discharge status: Home / Self Care | Payer: BLUE CROSS/BLUE SHIELD | Source: Ambulatory Visit | Attending: Gastroenterology | Admitting: Gastroenterology

## 2015-04-29 ENCOUNTER — Encounter (HOSPITAL_COMMUNITY): Payer: Self-pay | Admitting: *Deleted

## 2015-04-29 ENCOUNTER — Telehealth: Payer: Self-pay | Admitting: Gastroenterology

## 2015-04-29 DIAGNOSIS — Z6841 Body Mass Index (BMI) 40.0 and over, adult: Secondary | ICD-10-CM | POA: Diagnosis not present

## 2015-04-29 DIAGNOSIS — K22719 Barrett's esophagus with dysplasia, unspecified: Secondary | ICD-10-CM

## 2015-04-29 DIAGNOSIS — K2940 Chronic atrophic gastritis without bleeding: Secondary | ICD-10-CM | POA: Insufficient documentation

## 2015-04-29 DIAGNOSIS — F329 Major depressive disorder, single episode, unspecified: Secondary | ICD-10-CM | POA: Diagnosis not present

## 2015-04-29 DIAGNOSIS — Z87442 Personal history of urinary calculi: Secondary | ICD-10-CM | POA: Insufficient documentation

## 2015-04-29 DIAGNOSIS — G4733 Obstructive sleep apnea (adult) (pediatric): Secondary | ICD-10-CM | POA: Insufficient documentation

## 2015-04-29 DIAGNOSIS — H409 Unspecified glaucoma: Secondary | ICD-10-CM | POA: Diagnosis not present

## 2015-04-29 DIAGNOSIS — I480 Paroxysmal atrial fibrillation: Secondary | ICD-10-CM | POA: Insufficient documentation

## 2015-04-29 DIAGNOSIS — F17210 Nicotine dependence, cigarettes, uncomplicated: Secondary | ICD-10-CM | POA: Diagnosis not present

## 2015-04-29 DIAGNOSIS — E785 Hyperlipidemia, unspecified: Secondary | ICD-10-CM | POA: Diagnosis not present

## 2015-04-29 DIAGNOSIS — K2270 Barrett's esophagus without dysplasia: Secondary | ICD-10-CM | POA: Insufficient documentation

## 2015-04-29 DIAGNOSIS — R1310 Dysphagia, unspecified: Secondary | ICD-10-CM | POA: Diagnosis present

## 2015-04-29 DIAGNOSIS — J449 Chronic obstructive pulmonary disease, unspecified: Secondary | ICD-10-CM | POA: Insufficient documentation

## 2015-04-29 DIAGNOSIS — Z91041 Radiographic dye allergy status: Secondary | ICD-10-CM | POA: Diagnosis not present

## 2015-04-29 DIAGNOSIS — E138 Other specified diabetes mellitus with unspecified complications: Secondary | ICD-10-CM

## 2015-04-29 DIAGNOSIS — I25119 Atherosclerotic heart disease of native coronary artery with unspecified angina pectoris: Secondary | ICD-10-CM

## 2015-04-29 DIAGNOSIS — K2970 Gastritis, unspecified, without bleeding: Secondary | ICD-10-CM | POA: Diagnosis not present

## 2015-04-29 DIAGNOSIS — Z8673 Personal history of transient ischemic attack (TIA), and cerebral infarction without residual deficits: Secondary | ICD-10-CM | POA: Diagnosis not present

## 2015-04-29 DIAGNOSIS — K219 Gastro-esophageal reflux disease without esophagitis: Secondary | ICD-10-CM | POA: Insufficient documentation

## 2015-04-29 DIAGNOSIS — Z888 Allergy status to other drugs, medicaments and biological substances status: Secondary | ICD-10-CM | POA: Insufficient documentation

## 2015-04-29 DIAGNOSIS — E119 Type 2 diabetes mellitus without complications: Secondary | ICD-10-CM | POA: Diagnosis not present

## 2015-04-29 DIAGNOSIS — I2510 Atherosclerotic heart disease of native coronary artery without angina pectoris: Secondary | ICD-10-CM | POA: Diagnosis not present

## 2015-04-29 DIAGNOSIS — Z79899 Other long term (current) drug therapy: Secondary | ICD-10-CM | POA: Diagnosis not present

## 2015-04-29 DIAGNOSIS — I252 Old myocardial infarction: Secondary | ICD-10-CM | POA: Diagnosis not present

## 2015-04-29 HISTORY — PX: BIOPSY: SHX5522

## 2015-04-29 HISTORY — PX: ESOPHAGEAL DILATION: SHX303

## 2015-04-29 HISTORY — PX: ESOPHAGOGASTRODUODENOSCOPY (EGD) WITH PROPOFOL: SHX5813

## 2015-04-29 LAB — GLUCOSE, CAPILLARY
Glucose-Capillary: 148 mg/dL — ABNORMAL HIGH (ref 65–99)
Glucose-Capillary: 128 mg/dL — ABNORMAL HIGH (ref 65–99)

## 2015-04-29 SURGERY — ESOPHAGOGASTRODUODENOSCOPY (EGD) WITH PROPOFOL
Anesthesia: Monitor Anesthesia Care

## 2015-04-29 MED ORDER — MIDAZOLAM HCL 5 MG/5ML IJ SOLN
INTRAMUSCULAR | Status: DC | PRN
  Administered 2015-04-29: 1 mg via INTRAVENOUS

## 2015-04-29 MED ORDER — ONDANSETRON HCL 4 MG/2ML IJ SOLN
4.0000 mg | Freq: Once | INTRAMUSCULAR | Status: AC
  Administered 2015-04-29: 4 mg via INTRAVENOUS

## 2015-04-29 MED ORDER — LIDOCAINE VISCOUS 2 % MT SOLN
3.0000 mL | Freq: Once | OROMUCOSAL | Status: AC
  Administered 2015-04-29: 3 mL via OROMUCOSAL

## 2015-04-29 MED ORDER — FENTANYL CITRATE (PF) 100 MCG/2ML IJ SOLN
INTRAMUSCULAR | Status: DC | PRN
  Administered 2015-04-29 (×4): 25 ug via INTRAVENOUS

## 2015-04-29 MED ORDER — SIMETHICONE 40 MG/0.6ML PO SUSP
ORAL | Status: DC | PRN
  Administered 2015-04-29: 1000 mL

## 2015-04-29 MED ORDER — LIDOCAINE VISCOUS 2 % MT SOLN
3.0000 mL | Freq: Once | OROMUCOSAL | Status: AC
  Administered 2015-04-29: 3 mL via OROMUCOSAL
  Filled 2015-04-29: qty 15

## 2015-04-29 MED ORDER — LIDOCAINE HCL (CARDIAC) 10 MG/ML IV SOLN
INTRAVENOUS | Status: DC | PRN
  Administered 2015-04-29: 50 mg via INTRAVENOUS

## 2015-04-29 MED ORDER — FENTANYL CITRATE (PF) 100 MCG/2ML IJ SOLN
INTRAMUSCULAR | Status: AC
  Filled 2015-04-29: qty 2

## 2015-04-29 MED ORDER — MIDAZOLAM HCL 2 MG/2ML IJ SOLN
INTRAMUSCULAR | Status: AC
Start: 2015-04-29 — End: 2015-04-29
  Filled 2015-04-29: qty 2

## 2015-04-29 MED ORDER — ONDANSETRON HCL 4 MG/2ML IJ SOLN
INTRAMUSCULAR | Status: AC
  Filled 2015-04-29: qty 2

## 2015-04-29 MED ORDER — PROPOFOL 10 MG/ML IV BOLUS
INTRAVENOUS | Status: AC
  Filled 2015-04-29: qty 20

## 2015-04-29 MED ORDER — GLYCOPYRROLATE 0.2 MG/ML IJ SOLN
INTRAMUSCULAR | Status: AC
Start: 2015-04-29 — End: 2015-04-29
  Filled 2015-04-29: qty 1

## 2015-04-29 MED ORDER — ONDANSETRON HCL 4 MG/2ML IJ SOLN: 4.0000 mg | Freq: Once | INTRAMUSCULAR | Status: DC | PRN

## 2015-04-29 MED ORDER — GLYCOPYRROLATE 0.2 MG/ML IJ SOLN
0.2000 mg | Freq: Once | INTRAMUSCULAR | Status: AC
  Administered 2015-04-29: 0.2 mg via INTRAVENOUS

## 2015-04-29 MED ORDER — LIDOCAINE HCL (PF) 1 % IJ SOLN
INTRAMUSCULAR | Status: AC
  Filled 2015-04-29: qty 5

## 2015-04-29 MED ORDER — FENTANYL CITRATE (PF) 100 MCG/2ML IJ SOLN: 25.0000 ug | INTRAMUSCULAR | Status: DC | PRN

## 2015-04-29 MED ORDER — STERILE WATER FOR IRRIGATION IR SOLN
Status: DC | PRN
  Administered 2015-04-29: 1000 mL

## 2015-04-29 MED ORDER — FENTANYL CITRATE (PF) 100 MCG/2ML IJ SOLN
25.0000 ug | INTRAMUSCULAR | Status: AC
  Administered 2015-04-29 (×2): 25 ug via INTRAVENOUS

## 2015-04-29 MED ORDER — PROPOFOL INFUSION 10 MG/ML OPTIME
INTRAVENOUS | Status: DC | PRN
  Administered 2015-04-29: 13:00:00 via INTRAVENOUS
  Administered 2015-04-29: 125 ug/kg/min via INTRAVENOUS

## 2015-04-29 MED ORDER — MIDAZOLAM HCL 2 MG/2ML IJ SOLN
INTRAMUSCULAR | Status: AC
  Filled 2015-04-29: qty 2

## 2015-04-29 MED ORDER — LACTATED RINGERS IV SOLN
INTRAVENOUS | Status: DC
  Administered 2015-04-29: 11:00:00 via INTRAVENOUS

## 2015-04-29 MED ORDER — MIDAZOLAM HCL 2 MG/2ML IJ SOLN
1.0000 mg | INTRAMUSCULAR | Status: DC | PRN
  Administered 2015-04-29 (×2): 1 mg via INTRAVENOUS

## 2015-04-29 SURGICAL SUPPLY — 28 items
BALLN CRE LF 10-12 240X5.5 (BALLOONS)
BALLN DILATOR CRE 12-15 240 (BALLOONS)
BALLN DILATOR CRE 15-18 240 (BALLOONS) IMPLANT
BALLN DILATOR CRE 18-20 240 (BALLOONS) IMPLANT
BALLN DILATOR CRE WIREGUIDE (BALLOONS)
BALLOON CRE LF 10-12 240X5.5 (BALLOONS) IMPLANT
BALLOON DILATOR CRE 12-15 240 (BALLOONS) IMPLANT
BALLOON DILATOR CRE WIREGUIDE (BALLOONS) IMPLANT
BLOCK BITE 60FR ADLT L/F BLUE (MISCELLANEOUS) ×1 IMPLANT
ELECT REM PT RETURN 9FT ADLT (ELECTROSURGICAL)
ELECTRODE REM PT RTRN 9FT ADLT (ELECTROSURGICAL) IMPLANT
FLOOR PAD 36X40 (MISCELLANEOUS) ×2
FORCEPS BIOP RAD 4 LRG CAP 4 (CUTTING FORCEPS) IMPLANT
FORMALIN 10 PREFIL 20ML (MISCELLANEOUS) ×2 IMPLANT
KIT ENDO PROCEDURE PEN (KITS) ×2 IMPLANT
MANIFOLD NEPTUNE II (INSTRUMENTS) ×2 IMPLANT
NDL SCLEROTHERAPY 25GX240 (NEEDLE) IMPLANT
NEEDLE SCLEROTHERAPY 25GX240 (NEEDLE) IMPLANT
OVERTUBE ENDOCUFF GREEN (MISCELLANEOUS) ×1 IMPLANT
PAD FLOOR 36X40 (MISCELLANEOUS) IMPLANT
PROBE APC STR FIRE (PROBE) IMPLANT
PROBE INJECTION GOLD (MISCELLANEOUS)
PROBE INJECTION GOLD 7FR (MISCELLANEOUS) IMPLANT
SNARE SHORT THROW 13M SML OVAL (MISCELLANEOUS) IMPLANT
SYR INFLATE BILIARY GAUGE (MISCELLANEOUS) IMPLANT
SYR INFLATION 60ML (SYRINGE) IMPLANT
TUBING IRRIGATION ENDOGATOR (MISCELLANEOUS) ×1 IMPLANT
WATER STERILE IRR 1000ML POUR (IV SOLUTION) ×1 IMPLANT

## 2015-04-29 NOTE — Telephone Encounter (Signed)
Routing to [facility]  To make her aware

## 2015-04-29 NOTE — H&P (View-Only) (Signed)
Primary Care Physician:  Purvis Kilts, MD Primary Gastroenterologist:  Dr. Oneida Alar  Pre-Procedure History & Physical: HPI:  [name] is a 54 y.o. male here for SCREENING FOR BARRETT'S ESOPHAGUS/DYSPHAGIA.  Past Medical History  Diagnosis Date  . CAD (coronary artery disease) 2008    bypass grafting   . Morbid obesity   . PAF (paroxysmal atrial fibrillation)   . HTN (hypertension)   . COPD (chronic obstructive pulmonary disease)   . Hyperlipidemia   . S/P colonoscopy 2009    3-4 mm transverse colon erosions likely secondary to  ASA  . S/P endoscopy Dec 2011    moderate erosive gastritis, Barrett's esophagus 1-2cm  . Myocardial infarction 2008,2009,2009  . Chronic kidney disease     hx of kidney stones  . GERD (gastroesophageal reflux disease)   . Barrett's esophagus   . Depression   . Glaucoma   . SOB (shortness of breath) 11/03/2007    2D Echo EF 50%-55%  . Abnormal myocardial perfusion study 01/01/2011    there a small to moderate sized inferobasal scar  . Diabetes     type 2 diabetes mellitus  . Claudication 11/16/2011    PV test perform shows normal  . Cataracts, bilateral   . Dysrhythmia   . Hernia of abdominal wall   . Sleep apnea   . OSA (obstructive sleep apnea)     on cpap  . Stroke     Past Surgical History  Procedure Laterality Date  . Cabg x 4  03/2008  . Hernia repair      ventral hernia repair  . Coronary artery bypass graft  2008    4 vessels  . Cardiac catheterization  2009    stent placement to the left circumflex a 2.25   . Savory dilation  09/06/2011    Procedure: SAVORY DILATION;  Surgeon: Dorothyann Peng, MD;  Location: [facility];  Service: Endoscopy;  Laterality: N/A;  Dilated with 13m  . Esophagogastroduodenoscopy  09/2011    Barrett's esophagus, no dysplasia on biopsies. Distal esophagitis. Status post dilation. Moderate gastritis and duodenitis, but biopsies benign. Next EGD in November 2015 for surveillance of Barrett's  esophagus.  . Cardiac catheterization  07/08/2010  . Coronary stent placement  12/29/12  . Coronary stent placement  12/2012  . Left heart catheterization with coronary/graft angiogram N/A 12/29/2012    Procedure: LEFT HEART CATHETERIZATION WITH CBeatrix Fetters  Surgeon: TTroy Sine MD;  [facility];  Service: Cardiovascular;  Laterality: N/A;  . Percutaneous coronary stent intervention (pci-s)  12/29/2012    Procedure: PERCUTANEOUS CORONARY STENT INTERVENTION (PCI-S);  Surgeon: TTroy Sine MD;  [facility];  Service: Cardiovascular;;    Prior to Admission medications   Medication Sig Start Date End Date Taking? Authorizing Provider  apixaban (ELIQUIS) 5 MG TABS tablet Take 1 tablet (5 mg total) by mouth 2 (two) times daily. 04/30/14  Yes CDelfina Redwood MD  Choline Fenofibrate (TRILIPIX) 135 MG capsule Take 135 mg by mouth daily.   Yes Historical Provider, MD  diazepam (VALIUM) 5 MG tablet Take 5 mg by mouth 2 (two) times daily.   Yes Historical Provider, MD  escitalopram (LEXAPRO) 10 MG tablet Take 10 mg by mouth daily. 04/27/14  Yes Historical Provider, MD  furosemide (LASIX) 40 MG tablet Take 80 mg by mouth every morning.   Yes Historical Provider, MD  isosorbide mononitrate (IMDUR) 60 MG 24 hr tablet Take 1 tablet (60 mg total)  by mouth 3 (three) times daily. Please make an appointment for additional refills 03/21/15  Yes Mihai Croitoru, MD  lisinopril (PRINIVIL,ZESTRIL) 5 MG tablet Take 5 mg by mouth every morning.    Yes Historical Provider, MD  metoprolol (LOPRESSOR) 100 MG tablet Take 100 mg by mouth 2 (two) times daily.  08/08/14  Yes Historical Provider, MD  modafinil (PROVIGIL) 100 MG tablet Take 100 mg by mouth as directed. Can take up to twice a day   Yes Historical Provider, MD  nitroGLYCERIN (NITROLINGUAL) 0.4 MG/SPRAY spray Place 1 spray under the tongue every 5 (five) minutes x 3 doses as needed for chest pain.   Yes Historical Provider, MD   pantoprazole (PROTONIX) 40 MG tablet TAKE 1 TABLET BY MOUTH 30 MINUTES PRIOR TO BREAKFAST 01/06/15  Yes Carlis Stable, NP  potassium chloride SA (K-DUR,KLOR-CON) 20 MEQ tablet Take 1 tablet (20 mEq total) by mouth 2 (two) times daily. 07/22/14  Yes Mihai Croitoru, MD  ranolazine (RANEXA) 1000 MG SR tablet Take 1 tablet (1,000 mg total) by mouth 2 (two) times daily. NEED APPOINTMENT FOR FUTURE REFILLS 07/22/14  Yes Mihai Croitoru, MD  rosuvastatin (CRESTOR) 20 MG tablet Take 1 tablet (20 mg total) by mouth daily. Needs appointment for further refills   Yes Mihai Croitoru, MD  sitaGLIPtin (JANUVIA) 100 MG tablet Take 100 mg by mouth every morning.    Yes Historical Provider, MD  Umeclidinium-Vilanterol 62.5-25 MCG/INH AEPB Inhale 1 puff into the lungs daily as needed (wheezing).   Yes Historical Provider, MD    Allergies as of 04/07/2015 - Review Complete 04/07/2015  Allergen Reaction Noted  . Contrast media [iodinated diagnostic agents]  03/14/2013  . Iohexol  01/02/2008    Family History  Problem Relation Age of Onset  . Colon cancer Neg Hx   . Anesthesia problems Neg Hx   . Hypotension Neg Hx   . Malignant hyperthermia Neg Hx   . Pseudochol deficiency Neg Hx   . Diabetes Mother   . Heart disease Mother   . Diabetes Father   . Heart disease Father     History   Social History  . Marital Status: Divorced    Spouse Name: N/A  . Number of Children: N/A  . Years of Education: N/A   Occupational History  . Not on file.   Social History Main Topics  . Smoking status: Current Every Day Smoker -- 0.50 packs/day for 30 years    Types: Cigarettes  . Smokeless tobacco: Not on file  . Alcohol Use: Yes     Comment: every other day, one drink of beer or liquor  . Drug Use: No  . Sexual Activity: No   Other Topics Concern  . Not on file   Social History Narrative    Review of Systems: See HPI, otherwise negative ROS   Physical Exam: BP 128/82 mmHg  Pulse 68  Temp(Src) 98 F  (36.7 C) (Oral)  Resp 14  Ht '5\' 9"'$  (1.753 m)  Wt 314 lb (142.429 kg)  BMI 46.35 kg/m2  SpO2 94% General:   Alert,  pleasant and cooperative in NAD Head:  Normocephalic and atraumatic. Neck:  Supple; Lungs:  Clear throughout to auscultation.    Heart:  Regular rate and rhythm. Abdomen:  Soft, nontender and nondistended. Normal bowel sounds, without guarding, and without rebound.   Neurologic:  Alert and  oriented x4;  grossly normal neurologically.  Impression/Plan:    SCREENING FOR BARRETT'S ESOPHAGUS/DYSPHAGIA.  PLAN:  1.EGD/?  DIL NEXT TUES 2. HOLD ELIQUIS JUN 25 TODAY

## 2015-04-29 NOTE — Addendum Note (Signed)
Addendum  created 04/29/15 1405 by Mickel Baas, CRNA   Modules edited: Charges VN

## 2015-04-29 NOTE — Interval H&P Note (Signed)
History and Physical Interval Note:  04/29/2015 11:07 AM  [name]  has presented today for surgery, with the diagnosis of BARRETT'S/GERD/DYSPHAGIA  The various methods of treatment have been discussed with the patient and family. After consideration of risks, benefits and other options for treatment, the patient has consented to  Procedure(s) with comments: ESOPHAGOGASTRODUODENOSCOPY (EGD) WITH PROPOFOL (N/A) - 1215 ESOPHAGEAL DILATION (N/A) as a surgical intervention .  The patient's history has been reviewed, patient examined, no change in status, stable for surgery.  I have reviewed the patient's chart and labs.  Questions were answered to the patient's satisfaction.     [employer]

## 2015-04-29 NOTE — Anesthesia Postprocedure Evaluation (Signed)
  Anesthesia Post-op Note  Patient: [name]  Procedure(s) Performed: Procedure(s): ESOPHAGOGASTRODUODENOSCOPY (EGD) WITH PROPOFOL (N/A) ESOPHAGEAL DILATION 15 mm, 16 mm (N/A) BIOPSY (N/A)  Patient Location: PACU  Anesthesia Type:MAC  Level of Consciousness: awake, alert , oriented and patient cooperative  Airway and Oxygen Therapy: Patient Spontanous Breathing and Patient connected to face mask oxygen  Post-op Pain: none  Post-op Assessment: Post-op Vital signs reviewed, Patient's Cardiovascular Status Stable, Respiratory Function Stable, Patent Airway, No signs of Nausea or vomiting and Pain level controlled              Post-op Vital Signs: Reviewed and stable  Last Vitals:  Filed Vitals:   04/29/15 1300  BP: 116/73  Pulse:   Temp:   Resp: 24    Complications: No apparent anesthesia complications

## 2015-04-29 NOTE — Discharge Instructions (Signed)
I dilated your esophagus. I DID NOT SEE A DEFINITE NARROWING IN YOUR esophagus. You have gastritis. I biopsied your stomach.   HOLD ELIQUIS. RE-START JUL 1.  FOLLOW A LOW FAT/diabetic DIET.  MEATS SHOULD B CHOPPED OR GROUND ONLY. AVOID FRIED FOODS.  EAT 4 TO 6 small MEALS A DAY.  CONTINUE PROTONIX. TAKE 30 MINUTES PRIOR TO BREAKFAST.  YOUR BIOPSY RESULTS WILL BE AVAILABLE IN MY CHART AFTER JUN 30 AND MY OFFICE WILL CONTACT YOU IN 10-14 DAYS WITH YOUR RESULTS.   FOLLOW UP IN  4 MOS.  YOU ARE HIGH RISK FOR ANESTHESIA COMPLICATIONS DUE TO YOUR OBESITY. IF YOU DON'T LOSE WEIGHT(50 LBS),  WE WILL BE UNABLE TO PERFORM PROCEDURES AT [facility].   UPPER ENDOSCOPY AFTER CARE Read the instructions outlined below and refer to this sheet in the next week. These discharge instructions provide you with general information on caring for yourself after you leave the hospital. While your treatment has been planned according to the most current medical practices available, unavoidable complications occasionally occur. If you have any problems or questions after discharge, call DR. Desire Fulp, [phone].  ACTIVITY  You may resume your regular activity, but move at a slower pace for the next 24 hours.   Take frequent rest periods for the next 24 hours.   Walking will help get rid of the air and reduce the bloated feeling in your belly (abdomen).   No driving for 24 hours (because of the medicine (anesthesia) used during the test).   You may shower.   Do not sign any important legal documents or operate any machinery for 24 hours (because of the anesthesia used during the test).    NUTRITION  Drink plenty of fluids.   You may resume your normal diet as instructed by your doctor.   Begin with a light meal and progress to your normal diet. Heavy or fried foods are harder to digest and may make you feel sick to your stomach (nauseated).   Avoid alcoholic beverages for 24 hours or as instructed.      MEDICATIONS  You may resume your normal medications.   WHAT YOU CAN EXPECT TODAY  Some feelings of bloating in the abdomen.   Passage of more gas than usual.    IF YOU HAD A BIOPSY TAKEN DURING THE UPPER ENDOSCOPY:  Eat a soft diet IF YOU HAVE NAUSEA, BLOATING, ABDOMINAL PAIN, OR VOMITING.    FINDING OUT THE RESULTS OF YOUR TEST Not all test results are available during your visit. DR. Oneida Alar WILL CALL YOU WITHIN 14 DAYS OF YOUR PROCEDUE WITH YOUR RESULTS. Do not assume everything is normal if you have not heard from DR. Olaoluwa Grieder, CALL HER OFFICE AT [phone].  SEEK IMMEDIATE MEDICAL ATTENTION AND CALL THE OFFICE: [phone] IF:  You have more than a spotting of blood in your stool.   Your belly is swollen (abdominal distention).   You are nauseated or vomiting.   You have a temperature over 101F.   You have abdominal pain or discomfort that is severe or gets worse throughout the day.  Gastritis  Gastritis is an inflammation (the body's way of reacting to injury and/or infection) of the stomach. It is often caused by viral or bacterial (germ) infections. It can also be caused BY ASPIRIN, BC/GOODY POWDER'S, (IBUPROFEN) MOTRIN, OR ALEVE (NAPROXEN), chemicals (including alcohol), SPICY FOODS, and medications. This illness may be associated with generalized malaise (feeling tired, not well), UPPER ABDOMINAL STOMACH cramps, and fever. One common  bacterial cause of gastritis is an organism known as H. Pylori. This can be treated with antibiotics.    DYSPHAGIA DYSPHAGIA can be caused by stomach acid backing up into the tube that carries food from the mouth down to the stomach (lower esophagus).  TREATMENT There are a number of medicines used to treat DYSPHAGIA including: Antacids.  Proton-pump inhibitors: PROTONIX  HOME CARE INSTRUCTIONS Eat 2-3 hours before going to bed.  Try to reach and maintain a healthy weight.  Do not eat just a few very large meals. Instead,  eat 4 TO 6 smaller meals throughout the day.  Try to identify foods and beverages that make your symptoms worse, and avoid these.  Avoid tight clothing.  Do not exercise right after eating.   Low-Fat Diet BREADS, CEREALS, PASTA, RICE, DRIED PEAS, AND BEANS These products are high in carbohydrates and most are low in fat. Therefore, they can be increased in the diet as substitutes for fatty foods. They too, however, contain calories and should not be eaten in excess. Cereals can be eaten for snacks as well as for breakfast.   FRUITS AND VEGETABLES It is good to eat fruits and vegetables. Besides being sources of fiber, both are rich in vitamins and some minerals. They help you get the daily allowances of these nutrients. Fruits and vegetables can be used for snacks and desserts.  MEATS Limit lean meat, chicken, [place], and fish to no more than 6 ounces per day. Beef, Pork, and Lamb Use lean cuts of beef, pork, and lamb. Lean cuts include:  Extra-lean ground beef.  Arm roast.  Sirloin tip.  Center-cut ham.  Round steak.  Loin chops.  Rump roast.  Tenderloin.  Trim all fat off the outside of meats before cooking. It is not necessary to severely decrease the intake of red meat, but lean choices should be made. Lean meat is rich in protein and contains a highly absorbable form of iron. Premenopausal women, in particular, should avoid reducing lean red meat because this could increase the risk for low red blood cells (iron-deficiency anemia).  Chicken and [place] These are good sources of protein. The fat of poultry can be reduced by removing the skin and underlying fat layers before cooking. Chicken and [place] can be substituted for lean red meat in the diet. Poultry should not be fried or covered with high-fat sauces. Fish and Shellfish Fish is a good source of protein. Shellfish contain cholesterol, but they usually are low in saturated fatty acids. The preparation of fish is important. Like  chicken and [place], they should not be fried or covered with high-fat sauces. EGGS Egg whites contain no fat or cholesterol. They can be eaten often. Try 1 to 2 egg whites instead of whole eggs in recipes or use egg substitutes that do not contain yolk. MILK AND DAIRY PRODUCTS Use skim or 1% milk instead of 2% or whole milk. Decrease whole milk, natural, and processed cheeses. Use nonfat or low-fat (2%) cottage cheese or low-fat cheeses made from vegetable oils. Choose nonfat or low-fat (1 to 2%) yogurt. Experiment with evaporated skim milk in recipes that call for heavy cream. Substitute low-fat yogurt or low-fat cottage cheese for sour cream in dips and salad dressings. Have at least 2 servings of low-fat dairy products, such as 2 glasses of skim (or 1%) milk each day to help get your daily calcium intake. FATS AND OILS Reduce the total intake of fats, especially saturated fat. Butterfat, lard, and beef fats  are high in saturated fat and cholesterol. These should be avoided as much as possible. Vegetable fats do not contain cholesterol, but certain vegetable fats, such as coconut oil, palm oil, and palm kernel oil are very high in saturated fats. These should be limited. These fats are often used in bakery goods, processed foods, popcorn, oils, and nondairy creamers. Vegetable shortenings and some peanut butters contain hydrogenated oils, which are also saturated fats. Read the labels on these foods and check for saturated vegetable oils. Unsaturated vegetable oils and fats do not raise blood cholesterol. However, they should be limited because they are fats and are high in calories. Total fat should still be limited to 30% of your daily caloric intake. Desirable liquid vegetable oils are corn oil, cottonseed oil, olive oil, canola oil, safflower oil, soybean oil, and sunflower oil. Peanut oil is not as good, but small amounts are acceptable. Buy a heart-healthy tub margarine that has no partially  hydrogenated oils in the ingredients. Mayonnaise and salad dressings often are made from unsaturated fats, but they should also be limited because of their high calorie and fat content. Seeds, nuts, peanut butter, olives, and avocados are high in fat, but the fat is mainly the unsaturated type. These foods should be limited mainly to avoid excess calories and fat. OTHER EATING TIPS Snacks  Most sweets should be limited as snacks. They tend to be rich in calories and fats, and their caloric content outweighs their nutritional value. Some good choices in snacks are graham crackers, melba toast, soda crackers, bagels (no egg), English muffins, fruits, and vegetables. These snacks are preferable to snack crackers, [place] fries, TORTILLA CHIPS, and POTATO chips. Popcorn should be air-popped or cooked in small amounts of liquid vegetable oil. Desserts Eat fruit, low-fat yogurt, and fruit ices instead of pastries, cake, and cookies. Sherbet, angel food cake, gelatin dessert, frozen low-fat yogurt, or other frozen products that do not contain saturated fat (pure fruit juice bars, frozen ice pops) are also acceptable.  COOKING METHODS Choose those methods that use little or no fat. They include: Poaching.  Braising.  Steaming.  Grilling.  Baking.  Stir-frying.  Broiling.  Microwaving.  Foods can be cooked in a nonstick pan without added fat, or use a nonfat cooking spray in regular cookware. Limit fried foods and avoid frying in saturated fat. Add moisture to lean meats by using water, broth, cooking wines, and other nonfat or low-fat sauces along with the cooking methods mentioned above. Soups and stews should be chilled after cooking. The fat that forms on top after a few hours in the refrigerator should be skimmed off. When preparing meals, avoid using excess salt. Salt can contribute to raising blood pressure in some people.  EATING AWAY FROM HOME Order entres, potatoes, and vegetables without  sauces or butter. When meat exceeds the size of a deck of cards (3 to 4 ounces), the rest can be taken home for another meal. Choose vegetable or fruit salads and ask for low-calorie salad dressings to be served on the side. Use dressings sparingly. Limit high-fat toppings, such as bacon, crumbled eggs, cheese, sunflower seeds, and olives. Ask for heart-healthy tub margarine instead of butter.

## 2015-04-29 NOTE — Transfer of Care (Signed)
Immediate Anesthesia Transfer of Care Note  Patient: [name]  Procedure(s) Performed: Procedure(s): ESOPHAGOGASTRODUODENOSCOPY (EGD) WITH PROPOFOL (N/A) ESOPHAGEAL DILATION 15 mm, 16 mm (N/A) BIOPSY (N/A)  Patient Location: PACU  Anesthesia Type:MAC  Level of Consciousness: awake, alert , oriented and patient cooperative  Airway & Oxygen Therapy: Patient Spontanous Breathing and Patient connected to face mask oxygen  Post-op Assessment: Report given to RN and Post -op Vital signs reviewed and stable  Post vital signs: Reviewed and stable  Last Vitals:  Filed Vitals:   04/29/15 1300  BP: 116/73  Pulse:   Temp:   Resp: 24    Complications: No apparent anesthesia complications

## 2015-04-29 NOTE — Telephone Encounter (Signed)
Called [facility] to set up referral and they stated that pt must attend an information session first before being seen for evaluation.  Pt was given the number to call to set up session

## 2015-04-29 NOTE — Anesthesia Preprocedure Evaluation (Signed)
Anesthesia Evaluation  Patient identified by MRN, date of birth, ID band Patient awake    Reviewed: Allergy & Precautions, H&P , NPO status , Patient's Chart, lab work & pertinent test results, reviewed documented beta blocker date and time   History of Anesthesia Complications Negative for: history of anesthetic complications  Airway Mallampati: II  TM Distance: >3 FB     Dental  (+) Edentulous Upper, Partial Lower   Pulmonary shortness of breath, sleep apnea and Continuous Positive Airway Pressure Ventilation , COPD COPD inhaler, Current Smoker,  + rhonchi         Cardiovascular hypertension, Pt. on medications + angina with exertion + CAD, + Past MI, + CABG, + Orthopnea and + DOE + dysrhythmias Atrial Fibrillation Rhythm:Regular Rate:Normal     Neuro/Psych PSYCHIATRIC DISORDERS Depression    GI/Hepatic GERD- (barrett's esophagus)  Medicated,  Endo/Other  diabetes  Renal/GU      Musculoskeletal   Abdominal   Peds  Hematology   Anesthesia Other Findings   Reproductive/Obstetrics                             Anesthesia Physical Anesthesia Plan  ASA: III  Anesthesia Plan: MAC   Post-op Pain Management:    Induction: Intravenous  Airway Management Planned: Simple Face Mask  Additional Equipment:   Intra-op Plan:   Post-operative Plan:   Informed Consent: I have reviewed the patients History and Physical, chart, labs and discussed the procedure including the risks, benefits and alternatives for the proposed anesthesia with the patient or authorized representative who has indicated his/her understanding and acceptance.     Plan Discussed with:   Anesthesia Plan Comments:         Anesthesia Quick Evaluation

## 2015-04-29 NOTE — Telephone Encounter (Signed)
PLEASE SEND REFERRAL FOR GASTRIC BYPASS EVALUATION AT [facility] DX: SUPER  MORBID OBESITY(BMI 77), DIABETES, CAD.

## 2015-04-29 NOTE — Op Note (Signed)
[facility] [address] [place], [address]   ENDOSCOPY PROCEDURE REPORT  PATIENT: [name], [name]  MR#: #[id number] BIRTHDATE: Oct 03, 1961 , 62  yrs. old GENDER: male  ENDOSCOPIST: Danie Binder, MD REFFERED LK:JZPH Hilma Favors, M.D.  PROCEDURE DATE:  05/29/2015 PROCEDURE:   EGD with biopsy and EGD with dilatation over guidewire   INDICATIONS:1.  dysphagia.   2.  follow-up of Barrett's Esophagus. MEDICATIONS: Monitored anesthesia care TOPICAL ANESTHETIC: Viscous Xylocaine  DESCRIPTION OF PROCEDURE:   After the risks benefits and alternatives of the procedure were thoroughly explained, informed consent was obtained.  The     endoscope was introduced through the mouth and advanced to the second portion of the duodenum. The instrument was slowly withdrawn as the mucosa was carefully examined.  Prior to withdrawal of the scope, the guidwire was placed.  The esophagus was dilated successfully.  The patient was recovered in endoscopy and discharged home in satisfactory condition. Estimated blood loss is zero unless otherwise noted in this procedure report.   ESOPHAGUS: TWO < 1 CM SALMON COLORED TONGUES BIOPSIED VIA COLD FORCEPS.   STOMACH: Moderate non-erosive gastritis (inflammation) was found in the gastric antrum.  Multiple biopsies were performed using cold forceps.   DUODENUM: The duodenal mucosa showed no abnormalities in the bulb and second portion of the duodenum. Dilation was then performed at the gastroesphageal junction Dilator: Savary over guidewire Size(s): 15-16 MM Resistance: minimal Heme: none  COMPLICATIONS: There were no immediate complications.  ENDOSCOPIC IMPRESSION: 1.   BARRETT'S ESOPHAGUS 2.   MODEARTE Non-erosive gastritis  RECOMMENDATIONS: HOLD ELIQUIS.  RE-START JUL 1. FOLLOW A LOW FAT/diabetic DIET. EAT 4 TO 6 small MEALS A DAY. CONTINUE PROTONIX.  TAKE 30 MINUTES PRIOR TO BREAKFAST. AWAIT BIOPSY RESULTS. FOLLOW UP IN 4  MOS. PT IS HIGH RISK FOR ANESTHESIA COMPLICATIONS DUE TO BMI > 50.  IF HIS BMI REMAIN > 50, HE IS NOT A CANDIDATE FOR MAC OR CONSCIOUS SEDATION AT [facility].  DISCUSSED WITH MOTHER AND PT. REFERRAL FOR GASTRIC BYPASS MADE.   eSigned:  Danie Binder, MD 29-May-2015 2:44 PM   CPT CODES: ICD CODES:  The ICD and CPT codes recommended by this software are interpretations from the data that the clinical staff has captured with the software.  The verification of the translation of this report to the ICD and CPT codes and modifiers is the sole responsibility of the health care institution and practicing physician where this report was generated.  [employer] will not be held responsible for the validity of the ICD and CPT codes included on this report.  AMA assumes no liability for data contained or not contained herein. CPT is a Designer, television/film set of the [employer].

## 2015-04-29 NOTE — Telephone Encounter (Signed)
REVIEWED. AGREE. NO ADDITIONAL RECOMMENDATIONS. 

## 2015-04-29 NOTE — Anesthesia Procedure Notes (Signed)
Procedure Name: MAC Date/Time: 04/29/2015 1:03 PM Performed by: Andree Elk, Devan Danzer A Pre-anesthesia Checklist: Patient identified, Timeout performed, Emergency Drugs available, Suction available and Patient being monitored Oxygen Delivery Method: Nasal cannula

## 2015-04-30 ENCOUNTER — Encounter (HOSPITAL_COMMUNITY): Payer: Self-pay | Admitting: Gastroenterology

## 2015-05-01 DIAGNOSIS — E119 Type 2 diabetes mellitus without complications: Secondary | ICD-10-CM | POA: Diagnosis not present

## 2015-05-20 ENCOUNTER — Telehealth: Payer: Self-pay | Admitting: Gastroenterology

## 2015-05-20 NOTE — Telephone Encounter (Signed)
Please call pt. His stomach Bx shows ATROPHIC gastritis. ATROPHIC GASTRITIS MEANS THE ACID CELLS IN HIS STOMACH ARE NOT ABLE TO PRODUCE EXCESS ACID. HIS Bx shows BARRETT'S ESOPHAGUS.    FOLLOW A LOW FAT/diabetic DIET.  MEATS SHOULD BE CHOPPED OR GROUND ONLY. AVOID FRIED FOODS.  EAT 4 TO 6 small MEALS A DAY.  CONTINUE PROTONIX. TAKE 30 MINUTES PRIOR TO BREAKFAST.  FOLLOW UP IN  4 MOS E30 ATROPHIC GASTRITIS/DYSPHAGIA.  REPEAT EGD IN 3 YEARS AT [facility] IF BMI < 45 AND [facility] IF BMI > 45.

## 2015-05-20 NOTE — Telephone Encounter (Signed)
LMOM to call.

## 2015-05-21 NOTE — Telephone Encounter (Signed)
HAS FU OV, AND ON RECALL FOR 3 YR EGD

## 2015-05-21 NOTE — Telephone Encounter (Signed)
Letter mailed to pt to call.  

## 2015-05-22 NOTE — Telephone Encounter (Signed)
Pt came by the office and was informed of his results.

## 2015-05-23 ENCOUNTER — Other Ambulatory Visit (HOSPITAL_COMMUNITY): Payer: Self-pay | Admitting: Family Medicine

## 2015-05-23 ENCOUNTER — Ambulatory Visit (HOSPITAL_COMMUNITY)
Admission: RE | Admit: 2015-05-23 | Discharge: 2015-05-23 | Disposition: A | Discharge status: Home / Self Care | Payer: Medicare HMO | Source: Ambulatory Visit | Attending: Family Medicine | Admitting: Family Medicine

## 2015-05-23 DIAGNOSIS — M25571 Pain in right ankle and joints of right foot: Secondary | ICD-10-CM | POA: Diagnosis present

## 2015-05-23 DIAGNOSIS — X58XXXA Exposure to other specified factors, initial encounter: Secondary | ICD-10-CM | POA: Insufficient documentation

## 2015-05-23 DIAGNOSIS — S82831A Other fracture of upper and lower end of right fibula, initial encounter for closed fracture: Secondary | ICD-10-CM | POA: Insufficient documentation

## 2015-05-23 NOTE — Progress Notes (Signed)
REVIEWED.  

## 2015-05-29 NOTE — H&P (Signed)
Ronnie Fears, MD   Ronnie Borg, PA-C [address] [place], [place]  [address]                             [phone]   ORTHOPAEDIC HISTORY & PHYSICAL  [name] MRN:  [id number] DOB/SEX:  April 09, 1961/male  CHIEF COMPLAINT:  Painful right ankle   HISTORY: Just about a week and a half ago, [name] fell down some steps when he "got dizzy" and twisted his right ankle.  He did not think much of it, but, because of persistent pain, was seen by Dr. Hilma [name] this morning.  X-rays revealed a distal fibula fracture and was accordingly sent to the office.  He is indicated for ORIF distal right fibula   PAST MEDICAL HISTORY: Patient Active Problem List   Diagnosis Date Noted  . Dysphagia 04/07/2015  . Diabetes mellitus, type 2 04/28/2014  . Obesity, unspecified 04/28/2014  . CVA (cerebral infarction) 04/27/2014  . Aphasia 04/27/2014  . Pneumothorax on right 03/16/2014  . Lung nodule 03/16/2014  . Chest pain 03/15/2014  . Angina, class III 03/14/2013  . Tobacco abuse 03/14/2013  . Depression 03/14/2013  . Unstable angina 12/30/2012  . Abnormal nuclear stress test 12/30/2012  . Barrett's esophagus 08/25/2011  . ABDOMINAL PAIN, EPIGASTRIC 09/29/2010  . GERD 06/01/2010  . HEMATOCHEZIA 06/01/2010  . HEMATURIA UNSPECIFIED 06/01/2010  . ABDOMINAL PAIN 06/01/2010  . OBSTRUCTIVE SLEEP APNEA 06/21/2008  . RESTLESS LEGS SYNDROME 06/21/2008  . MYOCARDIAL INFARCTION 06/21/2008  . ALLERGIC RHINITIS 06/21/2008  . HYPERLIPIDEMIA 06/20/2008  . Morbid obesity 06/20/2008  . HYPERTENSION 06/20/2008  . CORONARY ARTERY DISEASE 06/20/2008  . PAROXYSMAL ATRIAL FIBRILLATION 06/20/2008   Past Medical History  Diagnosis Date  . CAD (coronary artery disease) 2008    bypass grafting   . Morbid obesity   . PAF (paroxysmal atrial fibrillation)   . HTN (hypertension)   . COPD (chronic obstructive pulmonary disease)   . Hyperlipidemia   . S/P colonoscopy 2009    3-4 mm  transverse colon erosions likely secondary to  ASA  . S/P endoscopy Dec 2011    moderate erosive gastritis, Barrett's esophagus 1-2cm  . Myocardial infarction 2008,2009,2009  . Chronic kidney disease     hx of kidney stones  . GERD (gastroesophageal reflux disease)   . Barrett's esophagus   . Depression   . Glaucoma   . SOB (shortness of breath) 11/03/2007    2D Echo EF 50%-55%  . Abnormal myocardial perfusion study 01/01/2011    there a small to moderate sized inferobasal scar  . Diabetes     type 2 diabetes mellitus  . Claudication 11/16/2011    PV test perform shows normal  . Cataracts, bilateral   . Dysrhythmia   . Hernia of abdominal wall   . Sleep apnea   . OSA (obstructive sleep apnea)     on cpap  . Stroke    Past Surgical History  Procedure Laterality Date  . Cabg x 4  03/2008  . Hernia repair      ventral hernia repair  . Coronary artery bypass graft  2008    4 vessels  . Cardiac catheterization  2009    stent placement to the left circumflex a 2.25   . Savory dilation  09/06/2011    Procedure: SAVORY DILATION;  Surgeon: Dorothyann Peng, MD;  Location: [facility];  Service: Endoscopy;  Laterality: N/A;  Dilated with 53m  . Esophagogastroduodenoscopy  09/2011    Barrett's esophagus, no dysplasia on biopsies. Distal esophagitis. Status post dilation. Moderate gastritis and duodenitis, but biopsies benign. Next EGD in November 2015 for surveillance of Barrett's esophagus.  . Cardiac catheterization  07/08/2010  . Coronary stent placement  12/29/12  . Coronary stent placement  12/2012  . Left heart catheterization with coronary/graft angiogram N/A 12/29/2012    Procedure: LEFT HEART CATHETERIZATION WITH CBeatrix Fetters  Surgeon: TTroy Sine MD;  [facility];  Service: Cardiovascular;  Laterality: N/A;  . Percutaneous coronary stent intervention (pci-s)  12/29/2012    Procedure: PERCUTANEOUS CORONARY STENT INTERVENTION (PCI-S);  Surgeon: TTroy Sine  MD;  [facility];  Service: Cardiovascular;;  . Esophagogastroduodenoscopy (egd) with propofol N/A 04/29/2015    Procedure: ESOPHAGOGASTRODUODENOSCOPY (EGD) WITH PROPOFOL;  Surgeon: SDanie Binder MD;  Location: [facility];  Service: Endoscopy;  Laterality: N/A;  . Esophageal dilation N/A 04/29/2015    Procedure: ESOPHAGEAL DILATION 15 mm, 16 mm;  Surgeon: SDanie Binder MD;  Location: [facility];  Service: Endoscopy;  Laterality: N/A;  . Esophageal biopsy N/A 04/29/2015    Procedure: BIOPSY;  Surgeon: SDanie Binder MD;  Location: [facility];  Service: Endoscopy;  Laterality: N/A;     MEDICATIONS:   Prescriptions prior to admission  Medication Sig Dispense Refill Last Dose  . apixaban (ELIQUIS) 5 MG TABS tablet Take 1 tablet (5 mg total) by mouth 2 (two) times daily. 60 tablet 0 05/30/2015  . Choline Fenofibrate (TRILIPIX) 135 MG capsule Take 135 mg by mouth daily.   06/02/2015 at Unknown time  . diazepam (VALIUM) 5 MG tablet Take 5 mg by mouth 2 (two) times daily.   06/03/2015 at 0700  . escitalopram (LEXAPRO) 10 MG tablet Take 10 mg by mouth daily.   06/03/2015 at 0700  . furosemide (LASIX) 80 MG tablet Take 80 mg by mouth daily.  4 06/02/2015 at Unknown time  . isosorbide mononitrate (IMDUR) 60 MG 24 hr tablet Take 1 tablet (60 mg total) by mouth 3 (three) times daily. Please make an appointment for additional refills 45 tablet 0 06/03/2015 at 0700  . lisinopril (PRINIVIL,ZESTRIL) 5 MG tablet Take 5 mg by mouth daily.    06/02/2015 at Unknown time  . metoprolol (LOPRESSOR) 100 MG tablet Take 100 mg by mouth 2 (two) times daily.    06/03/2015 at 0700  . modafinil (PROVIGIL) 100 MG tablet Take 100 mg by mouth 2 (two) times daily as needed (shortness of breath).    06/02/2015 at Unknown time  . nitroGLYCERIN (NITROLINGUAL) 0.4 MG/SPRAY spray Place 1 spray under the tongue every 5 (five) minutes x 3 doses as needed for chest pain.   Past Week at Unknown time  . pantoprazole (PROTONIX) 40 MG tablet TAKE 1 TABLET  BY MOUTH 30 MINUTES PRIOR TO BREAKFAST (Patient taking differently: TAKE 1 TABLET BY MOUTH TWICE DAILY) 30 tablet 3 06/03/2015 at 0700  . potassium chloride SA (K-DUR,KLOR-CON) 20 MEQ tablet Take 1 tablet (20 mEq total) by mouth 2 (two) times daily. 30 tablet 0 06/02/2015 at Unknown time  . ranolazine (RANEXA) 1000 MG SR tablet Take 1 tablet (1,000 mg total) by mouth 2 (two) times daily. NEED APPOINTMENT FOR FUTURE REFILLS 30 tablet 0 06/02/2015 at Unknown time  . rosuvastatin (CRESTOR) 20 MG tablet Take 1 tablet (20 mg total) by mouth daily. Needs appointment for further refills 30 tablet 0 06/02/2015 at Unknown time  .  sitaGLIPtin (JANUVIA) 100 MG tablet Take 100 mg by mouth daily.    06/02/2015 at Unknown time  . Umeclidinium-Vilanterol 62.5-25 MCG/INH AEPB Inhale 1 puff into the lungs daily as needed (wheezing). Anoro Ellipta   Past Week at Unknown time    ALLERGIES:   Allergies  Allergen Reactions  . Contrast Media [Iodinated Diagnostic Agents] Other (See Comments)    Pt must be premedicated before given contrast media - stops heart  . Iohexol Other (See Comments)     Consult with radiologist before pre meds are given.Desc: PT. STATES HEART STOPPED HAS TO BE PREMED.     REVIEW OF SYSTEMS:  Eyes: positive for glaucoma Cardiovascular: positive for heart disease, mini heart attacks, and strokes Neurological: positive for strokes Positive for sleep apnea   FAMILY HISTORY:   Family History  Problem Relation Age of Onset  . Colon cancer Neg Hx   . Anesthesia problems Neg Hx   . Hypotension Neg Hx   . Malignant hyperthermia Neg Hx   . Pseudochol deficiency Neg Hx   . Diabetes Mother   . Heart disease Mother   . Diabetes Father   . Heart disease Father     SOCIAL HISTORY:   History  Substance Use Topics  . Smoking status: Current Every Day Smoker -- 0.50 packs/day for 30 years    Types: Cigarettes  . Smokeless tobacco: Not on file  . Alcohol Use: Yes     Comment: every other day, one  drink of beer or liquor      EXAMINATION: Vital signs in last 24 hours: Temp:  [97.2 F (36.2 C)-98.7 F (37.1 C)] 97.2 F (36.2 C) (08/02 1021) Pulse Rate:  [62-76] 62 (08/02 1120) Resp:  [12-25] 17 (08/02 1120) BP: (94-143)/(59-99) 134/79 mmHg (08/02 1120) SpO2:  [94 %-98 %] 95 % (08/02 1120) Weight:  [139.708 kg (308 lb)-139.9 kg (308 lb 6.8 oz)] 139.708 kg (308 lb) (08/02 1021)  Head is normocephalic.   Eyes:  Pupils equal, round and reactive to light and accommodation.  Extraocular intact. ENT: Ears, nose, and throat were benign.   Neck: supple, no bruits were noted.   Chest: good expansion.   Lungs: essentially clear.   Cardiac: regular rhythm and rate, normal S1, S2.  No murmurs appreciated. Pulses :  1+ bilateral and symmetric in lower extremities. Abdomen is scaphoid, soft, nontender, no masses palpable, normal bowel sounds present. CNS:  He is oriented x3 and cranial nerves II-XII grossly intact. Breast, rectal, and genital exams: not performed and not indicated for an orthopedic evaluation. Musculoskeletal: He was tender along the distal fibula.  The skin revealed some dry scaly lesions along the lateral malleolus, but distal to his pain.  He does have some mild to moderate diffuse edema in the foot.  He relates that he has normal sensibility.  He seemed to have good capillary refill to the toes and there was no pain medially.  He does have pain with weightbearing.     Imaging Review obtained 3 views of his right ankle, AP, lateral and mortise views.  He does have an oblique fracture of the distal fibula above the ankle joint.  There is widening of the ankle mortise.      ASSESSMENT: Displaced right distal fibula fracture  Past Medical History  Diagnosis Date  . CAD (coronary artery disease) 2008    bypass grafting   . Morbid obesity   . PAF (paroxysmal atrial fibrillation)   . HTN (hypertension)   .  COPD (chronic obstructive pulmonary disease)   .  Hyperlipidemia   . S/P colonoscopy 2009    3-4 mm transverse colon erosions likely secondary to  ASA  . S/P endoscopy Dec 2011    moderate erosive gastritis, Barrett's esophagus 1-2cm  . Myocardial infarction 2008,2009,2009  . Chronic kidney disease     hx of kidney stones  . GERD (gastroesophageal reflux disease)   . Barrett's esophagus   . Depression   . Glaucoma   . SOB (shortness of breath) 11/03/2007    2D Echo EF 50%-55%  . Abnormal myocardial perfusion study 01/01/2011    there a small to moderate sized inferobasal scar  . Diabetes     type 2 diabetes mellitus  . Claudication 11/16/2011    PV test perform shows normal  . Cataracts, bilateral   . Dysrhythmia   . Hernia of abdominal wall   . Sleep apnea   . OSA (obstructive sleep apnea)     on cpap  . Stroke     PLAN: Plan for right open reduction internal fixation distal fibula  The procedure,  risks, and benefits of surgery were presented and reviewed. The risks including but not limited to infection, blood clots, vascular and nerve injury, stiffness,  among others were discussed. The patient acknowledged the explanation, agreed to proceed.   Mike Craze [place], [place] [phone]  06/03/2015 12:44 PM

## 2015-06-02 ENCOUNTER — Encounter (HOSPITAL_COMMUNITY): Payer: Self-pay

## 2015-06-02 ENCOUNTER — Encounter (HOSPITAL_COMMUNITY)
Admission: RE | Admit: 2015-06-02 | Discharge: 2015-06-02 | Disposition: A | Discharge status: Home / Self Care | Payer: Medicare HMO | Source: Ambulatory Visit | Attending: Orthopaedic Surgery | Admitting: Orthopaedic Surgery

## 2015-06-02 DIAGNOSIS — E785 Hyperlipidemia, unspecified: Secondary | ICD-10-CM | POA: Diagnosis not present

## 2015-06-02 DIAGNOSIS — I2510 Atherosclerotic heart disease of native coronary artery without angina pectoris: Secondary | ICD-10-CM | POA: Diagnosis not present

## 2015-06-02 DIAGNOSIS — S8261XA Displaced fracture of lateral malleolus of right fibula, initial encounter for closed fracture: Secondary | ICD-10-CM | POA: Diagnosis not present

## 2015-06-02 DIAGNOSIS — F17210 Nicotine dependence, cigarettes, uncomplicated: Secondary | ICD-10-CM | POA: Diagnosis not present

## 2015-06-02 DIAGNOSIS — N189 Chronic kidney disease, unspecified: Secondary | ICD-10-CM | POA: Diagnosis not present

## 2015-06-02 DIAGNOSIS — Z8673 Personal history of transient ischemic attack (TIA), and cerebral infarction without residual deficits: Secondary | ICD-10-CM | POA: Diagnosis not present

## 2015-06-02 DIAGNOSIS — Z7982 Long term (current) use of aspirin: Secondary | ICD-10-CM | POA: Diagnosis not present

## 2015-06-02 DIAGNOSIS — Z951 Presence of aortocoronary bypass graft: Secondary | ICD-10-CM | POA: Diagnosis not present

## 2015-06-02 DIAGNOSIS — I252 Old myocardial infarction: Secondary | ICD-10-CM | POA: Diagnosis not present

## 2015-06-02 DIAGNOSIS — E119 Type 2 diabetes mellitus without complications: Secondary | ICD-10-CM | POA: Diagnosis not present

## 2015-06-02 DIAGNOSIS — W109XXA Fall (on) (from) unspecified stairs and steps, initial encounter: Secondary | ICD-10-CM | POA: Diagnosis not present

## 2015-06-02 DIAGNOSIS — I129 Hypertensive chronic kidney disease with stage 1 through stage 4 chronic kidney disease, or unspecified chronic kidney disease: Secondary | ICD-10-CM | POA: Diagnosis not present

## 2015-06-02 DIAGNOSIS — S82491A Other fracture of shaft of right fibula, initial encounter for closed fracture: Secondary | ICD-10-CM | POA: Diagnosis present

## 2015-06-02 DIAGNOSIS — G4733 Obstructive sleep apnea (adult) (pediatric): Secondary | ICD-10-CM | POA: Diagnosis not present

## 2015-06-02 DIAGNOSIS — J449 Chronic obstructive pulmonary disease, unspecified: Secondary | ICD-10-CM | POA: Diagnosis not present

## 2015-06-02 LAB — URINALYSIS, ROUTINE W REFLEX MICROSCOPIC
BILIRUBIN URINE: NEGATIVE
Glucose, UA: 100 mg/dL — AB
Hgb urine dipstick: NEGATIVE
KETONES UR: NEGATIVE mg/dL
Leukocytes, UA: NEGATIVE
NITRITE: NEGATIVE
PH: 7.5 (ref 5.0–8.0)
PROTEIN: NEGATIVE mg/dL
Specific Gravity, Urine: 1.01 (ref 1.005–1.030)
Urobilinogen, UA: 1 mg/dL (ref 0.0–1.0)

## 2015-06-02 LAB — CBC WITH DIFFERENTIAL/PLATELET
Basophils Absolute: 0.1 10*3/uL (ref 0.0–0.1)
Basophils Relative: 1 % (ref 0–1)
EOS PCT: 5 % (ref 0–5)
Eosinophils Absolute: 0.6 10*3/uL (ref 0.0–0.7)
HEMATOCRIT: 47.5 % (ref 39.0–52.0)
HEMOGLOBIN: 17.1 g/dL — AB (ref 13.0–17.0)
Lymphocytes Relative: 21 % (ref 12–46)
Lymphs Abs: 2.8 10*3/uL (ref 0.7–4.0)
MCH: 34 pg (ref 26.0–34.0)
MCHC: 36 g/dL (ref 30.0–36.0)
MCV: 94.4 fL (ref 78.0–100.0)
Monocytes Absolute: 1 10*3/uL (ref 0.1–1.0)
Monocytes Relative: 8 % (ref 3–12)
Neutro Abs: 8.6 10*3/uL — ABNORMAL HIGH (ref 1.7–7.7)
Neutrophils Relative %: 65 % (ref 43–77)
Platelets: 273 10*3/uL (ref 150–400)
RBC: 5.03 MIL/uL (ref 4.22–5.81)
RDW: 12.9 % (ref 11.5–15.5)
WBC: 13.1 10*3/uL — AB (ref 4.0–10.5)

## 2015-06-02 LAB — COMPREHENSIVE METABOLIC PANEL
ALBUMIN: 3.4 g/dL — AB (ref 3.5–5.0)
ALT: 31 U/L (ref 17–63)
ANION GAP: 9 (ref 5–15)
AST: 28 U/L (ref 15–41)
Alkaline Phosphatase: 65 U/L (ref 38–126)
BILIRUBIN TOTAL: 0.7 mg/dL (ref 0.3–1.2)
BUN: 7 mg/dL (ref 6–20)
CO2: 26 mmol/L (ref 22–32)
Calcium: 9.3 mg/dL (ref 8.9–10.3)
Chloride: 104 mmol/L (ref 101–111)
Creatinine, Ser: 1.09 mg/dL (ref 0.61–1.24)
GFR calc non Af Amer: >60 mL/min (ref >60)
GLUCOSE: 111 mg/dL — AB (ref 65–99)
Potassium: 3.4 mmol/L — ABNORMAL LOW (ref 3.5–5.1)
SODIUM: 139 mmol/L (ref 135–145)
TOTAL PROTEIN: 6.4 g/dL — AB (ref 6.5–8.1)

## 2015-06-02 LAB — GLUCOSE, CAPILLARY: GLUCOSE-CAPILLARY: 130 mg/dL — AB (ref 65–99)

## 2015-06-02 LAB — TYPE AND SCREEN
ABO/RH(D): O POS
ANTIBODY SCREEN: NEGATIVE

## 2015-06-02 LAB — APTT: APTT: 28 s (ref 24–37)

## 2015-06-02 LAB — PROTIME-INR
INR: 1.13 (ref 0.00–1.49)
PROTHROMBIN TIME: 14.7 s (ref 11.6–15.2)

## 2015-06-02 MED ORDER — SODIUM CHLORIDE 0.9 % IV SOLN: INTRAVENOUS | Status: DC

## 2015-06-02 MED ORDER — ACETAMINOPHEN 10 MG/ML IV SOLN
1000.0000 mg | Freq: Once | INTRAVENOUS | Status: AC
  Administered 2015-06-03: 1000 mg via INTRAVENOUS

## 2015-06-02 MED ORDER — DEXTROSE 5 % IV SOLN
3.0000 g | INTRAVENOUS | Status: AC
  Administered 2015-06-03: 3 g via INTRAVENOUS
  Filled 2015-06-02 (×2): qty 3000

## 2015-06-02 NOTE — Pre-Procedure Instructions (Signed)
    SCHYLAR WUEBKER  06/02/2015      [facility] - [place], [place] - [id number] [place] [id number] Phone: [phone] Fax: [phone]  [facility] - [place], [address] [place] [place] [address] Phone: [phone] Fax: [phone]    Your procedure is scheduled on 06/03/15.  Report to [facility] Admitting at 11 A.M.  Call this number if you have problems the morning of surgery:  [phone]   Remember:  Do not eat food or drink liquids after midnight.  Take these medicines the morning of surgery with A SIP OF WATER --valium,lexapro,imdur,metoprolol,protonix   Do not wear jewelry, make-up or nail polish.  Do not wear lotions, powders, or perfumes.  You may wear deodorant.  Do not shave 48 hours prior to surgery.  Men may shave face and neck.  Do not bring valuables to the hospital.  [facility] is not responsible for any belongings or valuables.  Contacts, dentures or bridgework may not be worn into surgery.  Leave your suitcase in the car.  After surgery it may be brought to your room.  For patients admitted to the hospital, discharge time will be determined by your treatment team.  Patients discharged the day of surgery will not be allowed to drive home.   Name and phone number of your driver:    Special instructions:    Please read over the following fact sheets that you were given. Pain Booklet, Coughing and Deep Breathing, Blood Transfusion Information and Surgical Site Infection Prevention

## 2015-06-02 NOTE — Progress Notes (Addendum)
Anesthesia Chart Review:  Pt is 54 year old male scheduled for ORIF R distal fibula fracture on 06/03/2015 with Dr. Durward Fortes.   PCP is Dr. Sharilyn Sites in [place].   Cardiologist is Dr. Hamilton Capri (see notes in care everywhere), last seen 05/26/15 for pre-op clearance. Dr. Hamilton Capri notes "low risk from cardiovascular standpoint. Most of risk seems to be pulmonary related to me. Hold eliquis 48 hours, then restart with safe from procedure. Local block would be best. May need pulmonary referral. Remains in NSR, echo within one year normal overall systolic function".   PMH includes: CAD (MI (2008, 2009), S/p CABG x4; see most recent cath results below), PAF, HTN, hyperlipidemia, stroke, DM, COPD, OSA.  Current smoker. BMI 45.5. S/p EGD 04/29/15 and 09/06/11.   Medications include: eliquis, fenofibrate, lasix, imdur, lisinopril, metoprolol, modafinil, protonix, potassium, ranolazine, crestor, sitagliptin, anoro ellipta. Pt stopped eliquis 06/01/15.   Preoperative labs reviewed.  Urine culture and hgbA1c pending.   Chest x-ray 03/27/2015 reviewed. Stable chronic lung disease with scattered areas of parenchymal scarring bilaterally. No acute findings.  EKG 04/22/2015: NSR. Nonspecific ST and T wave abnormality  Echo 04/28/2014: - Left ventricle: The cavity size was normal. Wall thickness was normal. Systolic function was normal. The estimated ejection fraction was in the range of 55% to 60%. Features are consistent with a pseudonormal left ventricular filling pattern, with concomitant abnormal relaxation and increased filling pressure (grade 2 diastolic dysfunction). - Impressions: Tecfhnically limited study due to poor sound wave transmission.  Cardiac cath 12/30/2012 done for abnormal stress test:  1. Normal-to-mild left ventricular dysfunction with mid inferior wall hypo-to-akinesis and inferolateral hypo-to-akinesis with mild upper posterolateral hypocontractility with an ejection fraction  approximately 50%.  2. Severe native coronary artery disease with 30-40% eccentric narrowing in the mid left main coronary artery; 50% proximal left anterior descending artery stenosis; total occlusion of the proximal ramus intermedius vessel; progressive 95% ostial stenosis in the circumflex coronary artery, proximal to a previously placed proximal circumflex OM stent and total occlusion of the proximal-to-mid right coronary artery.  3. Old occlusion of the vein graft, which had supplied the right coronary artery.  4. Patent left internal mammary artery graft to the left anterior descending artery.  5. Patent vein graft to the ramus intermedius vessel.  6. Patent vein graft to the distal circumflex vessel.  7. Successful complex percutaneous coronary intervention involving the ostium and proximal portion of the left circumflex coronary artery with cutting balloon arthrotomy and ultimate stenting with a 2.25 x 12-mm DES Xience Xpedition stent postdilated to 2.4 mm with the 95% ostial stenosis being reduced to 0%.  8. Bivalirudin/IC nitroglycerin/the patient is on Effient in addition to aspirin therapy.  Pt will need further assessment from his assigned anesthesiologist DOS. If stable from respiratory standpoint, I anticipate he can proceed as scheduled.   Willeen Cass, FNP-BC [facility]/Anesthesiology Phone: [phone] 06/02/2015 5:06 PM

## 2015-06-03 ENCOUNTER — Ambulatory Visit (HOSPITAL_COMMUNITY): Payer: Medicare HMO | Admitting: Emergency Medicine

## 2015-06-03 ENCOUNTER — Encounter (HOSPITAL_COMMUNITY): Payer: Self-pay | Admitting: *Deleted

## 2015-06-03 ENCOUNTER — Ambulatory Visit (HOSPITAL_COMMUNITY): Payer: Medicare HMO | Admitting: Anesthesiology

## 2015-06-03 ENCOUNTER — Ambulatory Visit (HOSPITAL_COMMUNITY): Payer: Medicare HMO

## 2015-06-03 ENCOUNTER — Ambulatory Visit (HOSPITAL_COMMUNITY)
Admission: RE | Admit: 2015-06-03 | Discharge: 2015-06-03 | Disposition: A | Discharge status: Home / Self Care | Payer: Medicare HMO | Source: Ambulatory Visit | Attending: Orthopaedic Surgery | Admitting: Orthopaedic Surgery

## 2015-06-03 ENCOUNTER — Encounter (HOSPITAL_COMMUNITY)
Admission: RE | Disposition: A | Discharge status: Home / Self Care | Payer: Self-pay | Source: Ambulatory Visit | Attending: Orthopaedic Surgery

## 2015-06-03 DIAGNOSIS — I252 Old myocardial infarction: Secondary | ICD-10-CM | POA: Diagnosis not present

## 2015-06-03 DIAGNOSIS — F17210 Nicotine dependence, cigarettes, uncomplicated: Secondary | ICD-10-CM | POA: Insufficient documentation

## 2015-06-03 DIAGNOSIS — S82401A Unspecified fracture of shaft of right fibula, initial encounter for closed fracture: Secondary | ICD-10-CM | POA: Diagnosis present

## 2015-06-03 DIAGNOSIS — G4733 Obstructive sleep apnea (adult) (pediatric): Secondary | ICD-10-CM | POA: Insufficient documentation

## 2015-06-03 DIAGNOSIS — I129 Hypertensive chronic kidney disease with stage 1 through stage 4 chronic kidney disease, or unspecified chronic kidney disease: Secondary | ICD-10-CM | POA: Insufficient documentation

## 2015-06-03 DIAGNOSIS — J449 Chronic obstructive pulmonary disease, unspecified: Secondary | ICD-10-CM | POA: Insufficient documentation

## 2015-06-03 DIAGNOSIS — Z951 Presence of aortocoronary bypass graft: Secondary | ICD-10-CM | POA: Insufficient documentation

## 2015-06-03 DIAGNOSIS — E119 Type 2 diabetes mellitus without complications: Secondary | ICD-10-CM | POA: Insufficient documentation

## 2015-06-03 DIAGNOSIS — Z419 Encounter for procedure for purposes other than remedying health state, unspecified: Secondary | ICD-10-CM

## 2015-06-03 DIAGNOSIS — S8261XA Displaced fracture of lateral malleolus of right fibula, initial encounter for closed fracture: Secondary | ICD-10-CM | POA: Diagnosis not present

## 2015-06-03 DIAGNOSIS — Z8673 Personal history of transient ischemic attack (TIA), and cerebral infarction without residual deficits: Secondary | ICD-10-CM | POA: Insufficient documentation

## 2015-06-03 DIAGNOSIS — I2510 Atherosclerotic heart disease of native coronary artery without angina pectoris: Secondary | ICD-10-CM | POA: Diagnosis not present

## 2015-06-03 DIAGNOSIS — E785 Hyperlipidemia, unspecified: Secondary | ICD-10-CM | POA: Insufficient documentation

## 2015-06-03 DIAGNOSIS — Z7982 Long term (current) use of aspirin: Secondary | ICD-10-CM | POA: Insufficient documentation

## 2015-06-03 DIAGNOSIS — W109XXA Fall (on) (from) unspecified stairs and steps, initial encounter: Secondary | ICD-10-CM | POA: Insufficient documentation

## 2015-06-03 DIAGNOSIS — N189 Chronic kidney disease, unspecified: Secondary | ICD-10-CM | POA: Insufficient documentation

## 2015-06-03 HISTORY — PX: ORIF FIBULA FRACTURE: SHX5114

## 2015-06-03 LAB — GLUCOSE, CAPILLARY
Glucose-Capillary: 136 mg/dL — ABNORMAL HIGH (ref 65–99)
Glucose-Capillary: 131 mg/dL — ABNORMAL HIGH (ref 65–99)
Glucose-Capillary: 111 mg/dL — ABNORMAL HIGH (ref 65–99)

## 2015-06-03 LAB — URINE CULTURE: Culture: NO GROWTH

## 2015-06-03 LAB — HEMOGLOBIN A1C
HEMOGLOBIN A1C: 6.7 % — AB (ref 4.8–5.6)
Mean Plasma Glucose: 146 mg/dL

## 2015-06-03 SURGERY — OPEN REDUCTION INTERNAL FIXATION (ORIF) FIBULA FRACTURE
Anesthesia: Regional | Site: Ankle | Laterality: Right

## 2015-06-03 MED ORDER — APIXABAN 5 MG PO TABS: 5.0000 mg | ORAL_TABLET | Freq: Two times a day (BID) | ORAL | Status: DC

## 2015-06-03 MED ORDER — LACTATED RINGERS IV SOLN: INTRAVENOUS | Status: DC

## 2015-06-03 MED ORDER — PROPOFOL 10 MG/ML IV BOLUS
INTRAVENOUS | Status: DC | PRN
  Administered 2015-06-03: 20 mg via INTRAVENOUS

## 2015-06-03 MED ORDER — MEPERIDINE HCL 25 MG/ML IJ SOLN: 6.2500 mg | INTRAMUSCULAR | Status: DC | PRN

## 2015-06-03 MED ORDER — BUPIVACAINE HCL (PF) 0.25 % IJ SOLN
INTRAMUSCULAR | Status: AC
  Filled 2015-06-03: qty 30

## 2015-06-03 MED ORDER — HYDROMORPHONE HCL 1 MG/ML IJ SOLN: 0.2500 mg | INTRAMUSCULAR | Status: DC | PRN

## 2015-06-03 MED ORDER — MIDAZOLAM HCL 2 MG/2ML IJ SOLN
INTRAMUSCULAR | Status: AC
  Filled 2015-06-03: qty 4

## 2015-06-03 MED ORDER — FENTANYL CITRATE (PF) 250 MCG/5ML IJ SOLN
INTRAMUSCULAR | Status: AC
  Filled 2015-06-03: qty 5

## 2015-06-03 MED ORDER — HYDROCODONE-ACETAMINOPHEN 5-325 MG PO TABS: 1.0000 | ORAL_TABLET | ORAL | Status: DC | PRN

## 2015-06-03 MED ORDER — MIDAZOLAM HCL 2 MG/2ML IJ SOLN
INTRAMUSCULAR | Status: AC
  Administered 2015-06-03: 1 mg
  Filled 2015-06-03: qty 2

## 2015-06-03 MED ORDER — ALBUTEROL SULFATE (2.5 MG/3ML) 0.083% IN NEBU
2.5000 mg | INHALATION_SOLUTION | Freq: Once | RESPIRATORY_TRACT | Status: AC
  Administered 2015-06-03: 2.5 mg via RESPIRATORY_TRACT

## 2015-06-03 MED ORDER — 0.9 % SODIUM CHLORIDE (POUR BTL) OPTIME
TOPICAL | Status: DC | PRN
  Administered 2015-06-03: 1000 mL

## 2015-06-03 MED ORDER — PROPOFOL INFUSION 10 MG/ML OPTIME
INTRAVENOUS | Status: DC | PRN
  Administered 2015-06-03: 25 ug/kg/min via INTRAVENOUS

## 2015-06-03 MED ORDER — BUPIVACAINE-EPINEPHRINE (PF) 0.5% -1:200000 IJ SOLN
INTRAMUSCULAR | Status: DC | PRN
  Administered 2015-06-03: 30 mL via PERINEURAL

## 2015-06-03 MED ORDER — MIDAZOLAM HCL 5 MG/ML IJ SOLN: 1.0000 mg | Freq: Once | INTRAMUSCULAR | Status: AC

## 2015-06-03 MED ORDER — FENTANYL CITRATE (PF) 100 MCG/2ML IJ SOLN
INTRAMUSCULAR | Status: DC | PRN
  Administered 2015-06-03 (×5): 50 ug via INTRAVENOUS

## 2015-06-03 MED ORDER — MIDAZOLAM HCL 5 MG/5ML IJ SOLN
INTRAMUSCULAR | Status: DC | PRN
  Administered 2015-06-03 (×2): 1 mg via INTRAVENOUS

## 2015-06-03 MED ORDER — FENTANYL CITRATE (PF) 100 MCG/2ML IJ SOLN
INTRAMUSCULAR | Status: AC
  Filled 2015-06-03: qty 2

## 2015-06-03 MED ORDER — BUPIVACAINE HCL (PF) 0.25 % IJ SOLN
INTRAMUSCULAR | Status: DC | PRN
  Administered 2015-06-03: 20 mL

## 2015-06-03 MED ORDER — PROMETHAZINE HCL 25 MG/ML IJ SOLN: 6.2500 mg | INTRAMUSCULAR | Status: DC | PRN

## 2015-06-03 MED ORDER — LACTATED RINGERS IV SOLN
INTRAVENOUS | Status: DC | PRN
  Administered 2015-06-03 (×2): via INTRAVENOUS

## 2015-06-03 MED ORDER — ALBUTEROL SULFATE (2.5 MG/3ML) 0.083% IN NEBU
INHALATION_SOLUTION | RESPIRATORY_TRACT | Status: AC
  Administered 2015-06-03: 2.5 mg via RESPIRATORY_TRACT
  Filled 2015-06-03: qty 3

## 2015-06-03 SURGICAL SUPPLY — 84 items
BANDAGE ELASTIC 4 VELCRO ST LF (GAUZE/BANDAGES/DRESSINGS) IMPLANT
BANDAGE ELASTIC 6 VELCRO ST LF (GAUZE/BANDAGES/DRESSINGS) IMPLANT
BANDAGE ESMARK 6X9 LF (GAUZE/BANDAGES/DRESSINGS) ×1 IMPLANT
BIT DRILL 110X2.5XQCK CNCT (BIT) IMPLANT
BIT DRILL 2.5 (BIT) ×2
BIT DRILL 2.7 (BIT) ×1
BIT DRILL 2.7MM (BIT) IMPLANT
BIT DRILL QC 110 3.5 (BIT) ×1
BIT DRILL QC 110 3.5MM (BIT) IMPLANT
BIT DRL 110X2.5XQCK CNCT (BIT) ×1
BLADE SURG 10 STRL SS (BLADE) ×2 IMPLANT
BNDG CMPR 9X6 STRL LF SNTH (GAUZE/BANDAGES/DRESSINGS)
BNDG ESMARK 6X9 LF (GAUZE/BANDAGES/DRESSINGS)
BNDG GAUZE ELAST 4 BULKY (GAUZE/BANDAGES/DRESSINGS) ×4 IMPLANT
COVER SURGICAL LIGHT HANDLE (MISCELLANEOUS) ×2 IMPLANT
CUFF TOURNIQUET SINGLE 34IN LL (TOURNIQUET CUFF) ×1 IMPLANT
CUFF TOURNIQUET SINGLE 44IN (TOURNIQUET CUFF) IMPLANT
DRAPE C-ARM 42X72 X-RAY (DRAPES) ×1 IMPLANT
DRAPE C-ARMOR (DRAPES) ×1 IMPLANT
DRAPE INCISE IOBAN 66X45 STRL (DRAPES) ×1 IMPLANT
DRAPE OEC MINIVIEW 54X84 (DRAPES) ×1 IMPLANT
DRILL BIT 2.7MM (BIT) ×2
DRILL BIT QC 110 3.5MM (BIT) ×2
DRSG ADAPTIC 3X8 NADH LF (GAUZE/BANDAGES/DRESSINGS) ×1 IMPLANT
DRSG PAD ABDOMINAL 8X10 ST (GAUZE/BANDAGES/DRESSINGS) IMPLANT
DURAPREP 26ML APPLICATOR (WOUND CARE) ×3 IMPLANT
ELECT REM PT RETURN 9FT ADLT (ELECTROSURGICAL) ×2
ELECTRODE REM PT RTRN 9FT ADLT (ELECTROSURGICAL) ×1 IMPLANT
FACESHIELD WRAPAROUND (MASK) IMPLANT
FACESHIELD WRAPAROUND OR TEAM (MASK) ×1 IMPLANT
GAUZE SPONGE 4X4 12PLY STRL (GAUZE/BANDAGES/DRESSINGS) ×1 IMPLANT
GLOVE BIOGEL PI IND STRL 8 (GLOVE) ×1 IMPLANT
GLOVE BIOGEL PI IND STRL 8.5 (GLOVE) ×1 IMPLANT
GLOVE BIOGEL PI INDICATOR 8 (GLOVE) ×1
GLOVE BIOGEL PI INDICATOR 8.5 (GLOVE) ×1
GLOVE ECLIPSE 8.0 STRL XLNG CF (GLOVE) ×2 IMPLANT
GLOVE ECLIPSE 8.5 STRL (GLOVE) ×3 IMPLANT
GOWN STRL REUS W/ TWL LRG LVL3 (GOWN DISPOSABLE) ×2 IMPLANT
GOWN STRL REUS W/ TWL XL LVL3 (GOWN DISPOSABLE) ×1 IMPLANT
GOWN STRL REUS W/TWL 2XL LVL3 (GOWN DISPOSABLE) ×2 IMPLANT
GOWN STRL REUS W/TWL LRG LVL3 (GOWN DISPOSABLE) ×4
GOWN STRL REUS W/TWL XL LVL3 (GOWN DISPOSABLE) ×2
KIT BASIN OR (CUSTOM PROCEDURE TRAY) ×2 IMPLANT
KIT ROOM TURNOVER OR (KITS) ×2 IMPLANT
MANIFOLD NEPTUNE II (INSTRUMENTS) ×2 IMPLANT
NDL KEITH (NEEDLE) IMPLANT
NEEDLE 22X1 1/2 (OR ONLY) (NEEDLE) ×1 IMPLANT
NEEDLE KEITH (NEEDLE) IMPLANT
NS IRRIG 1000ML POUR BTL (IV SOLUTION) ×2 IMPLANT
PACK ORTHO EXTREMITY (CUSTOM PROCEDURE TRAY) ×2 IMPLANT
PAD ARMBOARD 7.5X6 YLW CONV (MISCELLANEOUS) ×4 IMPLANT
PAD CAST 4YDX4 CTTN HI CHSV (CAST SUPPLIES) IMPLANT
PADDING CAST COTTON 4X4 STRL (CAST SUPPLIES) ×2
PADDING CAST COTTON 6X4 STRL (CAST SUPPLIES) ×2 IMPLANT
PLATE VERTUBULAR LOCK 6HOLE (Plate) ×1 IMPLANT
SCREW CANCELLOUS 4.0X18MM (Screw) ×1 IMPLANT
SCREW CORT 2.5X20X3.5XST SM (Screw) IMPLANT
SCREW CORT 2.5X24X3.5XST SM (Screw) IMPLANT
SCREW CORTICAL 3.5 16MM (Screw) ×1 IMPLANT
SCREW CORTICAL 3.5 18MM (Screw) ×1 IMPLANT
SCREW CORTICAL 3.5X12 (Screw) ×3 IMPLANT
SCREW CORTICAL 3.5X20 (Screw) ×2 IMPLANT
SCREW CORTICAL 3.5X24MM (Screw) ×2 IMPLANT
SCREW LOCKING 18X3.5MM (Screw) ×1 IMPLANT
SPLINT PLASTER CAST XFAST 5X30 (CAST SUPPLIES) IMPLANT
SPLINT PLASTER XFAST SET 5X30 (CAST SUPPLIES) ×1
SPONGE LAP 4X18 X RAY DECT (DISPOSABLE) ×2 IMPLANT
STAPLER VISISTAT 35W (STAPLE) IMPLANT
STRIP CLOSURE SKIN 1/2X4 (GAUZE/BANDAGES/DRESSINGS) IMPLANT
SUCTION FRAZIER TIP 10 FR DISP (SUCTIONS) ×3 IMPLANT
SUT ETHIBOND CT1 BRD 2-0 30IN (SUTURE) IMPLANT
SUT ETHIBOND NAB BRD #0 18IN (SUTURE) IMPLANT
SUT MNCRL AB 3-0 PS2 18 (SUTURE) ×1 IMPLANT
SUT PROLENE 3 0 PS 2 (SUTURE) IMPLANT
SUT VIC AB 0 CT1 27 (SUTURE) ×2
SUT VIC AB 0 CT1 27XBRD ANBCTR (SUTURE) IMPLANT
SUT VIC AB 2-0 CT1 27 (SUTURE) ×2
SUT VIC AB 2-0 CT1 TAPERPNT 27 (SUTURE) ×1 IMPLANT
SUT VIC AB 3-0 FS2 27 (SUTURE) ×2 IMPLANT
SYR CONTROL 10ML LL (SYRINGE) ×1 IMPLANT
TOWEL OR 17X24 6PK STRL BLUE (TOWEL DISPOSABLE) ×2 IMPLANT
TOWEL OR 17X26 10 PK STRL BLUE (TOWEL DISPOSABLE) ×2 IMPLANT
TUBE CONNECTING 12X1/4 (SUCTIONS) ×2 IMPLANT
WATER STERILE IRR 1000ML POUR (IV SOLUTION) ×1 IMPLANT

## 2015-06-03 NOTE — Anesthesia Procedure Notes (Addendum)
Anesthesia Regional Block:  Popliteal block  Pre-Anesthetic Checklist: ,, timeout performed, Correct Patient, Correct Site, Correct Laterality, Correct Procedure, Correct Position, site marked, Risks and benefits discussed, Surgical consent, Pre-op evaluation  Laterality: Right  Prep: chloraprep       Needles:  Injection technique: Single-shot  Needle Type: Echogenic Needle     Needle Length: 9cm 9 cm Needle Gauge: 21 and 21 G    Additional Needles:  Procedures: ultrasound guided (picture in chart) Popliteal block Narrative:  Start time: 06/03/2015 11:50 AM End time: 06/03/2015 11:58 AM Injection made incrementally with aspirations every 5 mL.  Performed by: Personally  Anesthesiologist: Marye Round  Additional Notes: No complications noted. Pt tolerated well.    Procedure Name: MAC Date/Time: 06/03/2015 1:06 PM Performed by: Babs Bertin Pre-anesthesia Checklist: Patient identified, Emergency Drugs available, Suction available, Patient being monitored and Timeout performed Patient Re-evaluated:Patient Re-evaluated prior to inductionOxygen Delivery Method: Simple face mask

## 2015-06-03 NOTE — Anesthesia Preprocedure Evaluation (Addendum)
Anesthesia Evaluation   Patient awake    Reviewed: Allergy & Precautions, NPO status , Patient's Chart, lab work & pertinent test results, reviewed documented beta blocker date and time   Airway Mallampati: II  TM Distance: <3 FB Neck ROM: Full    Dental  (+) Edentulous Upper, Missing   Pulmonary shortness of breath and with exertion, sleep apnea and Continuous Positive Airway Pressure Ventilation , COPD COPD inhaler, Current Smoker,    + wheezing      Cardiovascular hypertension, Pt. on home beta blockers + angina with exertion + CAD, + Past MI and + CABG Rhythm:Regular Rate:Normal     Neuro/Psych Anxiety CVA, No Residual Symptoms    GI/Hepatic GERD-  ,  Endo/Other  diabetes, Type 2  Renal/GU Renal disease     Musculoskeletal   Abdominal (+) + obese,   Bowel sounds: normal.  Peds  Hematology   Anesthesia Other Findings   Reproductive/Obstetrics                         Lab Results  Component Value Date   WBC 13.1* 06/02/2015   HGB 17.1* 06/02/2015   HCT 47.5 06/02/2015   MCV 94.4 06/02/2015   PLT 273 06/02/2015   Lab Results  Component Value Date   CREATININE 1.09 06/02/2015   BUN 7 06/02/2015   NA 139 06/02/2015   K 3.4* 06/02/2015   CL 104 06/02/2015   CO2 26 06/02/2015   Lab Results  Component Value Date   INR 1.13 06/02/2015   INR 1.06 03/15/2014   INR 1.00 12/29/2012   Stress Test 2014 Impression Exercise Capacity: Lexiscan with no exercise. BP Response: Hypotensive blood pressure response. Clinical Symptoms: Mild chest pain/dyspnea. ECG Impression: No significant ECG changes with Lexiscan. Comparison with Prior Nuclear Study: *There appears to be persistent inferior infarct with superimposed apical and apical lateral reversible ischemia which is new compared to a study in 12/2010.  Overall Impression: Intermediate stress nuclear study. Small to moderate-sized area  of reversible ischemia with fixed inferior scar.  LV Wall Motion: LVEF 52%. Inferior hypokinesis.   CXR 2015 FINDINGS: The heart size is stable and within normal limits. Evidence of prior CABG. Stable chronic lung disease with scattered areas of parenchymal scarring bilaterally. There is no evidence of pulmonary edema, consolidation, pneumothorax, nodule or pleural fluid. The bony thorax shows stable mild thoracic spondylosis.   IMPRESSION: Stable chronic lung disease.  No acute findings.   Echo 2015 Study Conclusions  - Left ventricle: The cavity size was normal. Wall thickness was   normal. Systolic function was normal. The estimated ejection   fraction was in the range of 55% to 60%. Features are consistent   with a pseudonormal left ventricular filling pattern, with   concomitant abnormal relaxation and increased filling pressure   (grade 2 diastolic dysfunction). - Impressions: Tecfhnically limited study due to poor sound wave   transmission.   Anesthesia Physical Anesthesia Plan  ASA: III  Anesthesia Plan: Regional   Post-op Pain Management:    Induction: Intravenous  Airway Management Planned: Simple Face Mask  Additional Equipment:   Intra-op Plan:   Post-operative Plan: Extubation in OR  Informed Consent: I have reviewed the patients History and Physical, chart, labs and discussed the procedure including the risks, benefits and alternatives for the proposed anesthesia with the patient or authorized representative who has indicated his/her understanding and acceptance.   Dental advisory given  Plan Discussed with: CRNA  Anesthesia Plan Comments: (Risks and plan discussed patient and his wife. Counseling on smoking cessation also addressed. )      Anesthesia Quick Evaluation

## 2015-06-03 NOTE — Progress Notes (Signed)
Blood bank called and checked blood bracelet number.

## 2015-06-03 NOTE — Progress Notes (Signed)
Orthopedic Tech Progress Note Patient Details:  [name] 08-Sep-1961 [id number]  Ortho Devices Type of Ortho Device: Crutches Ortho Device/Splint Location: at bedside Ortho Device/Splint Interventions: Ordered, Adjustment   Braulio Bosch 06/03/2015, 3:52 PM

## 2015-06-03 NOTE — Transfer of Care (Signed)
Immediate Anesthesia Transfer of Care Note  Patient: [name]  Procedure(s) Performed: Procedure(s): OPEN REDUCTION INTERNAL FIXATION (ORIF) DISTAL FIBULA  FRACTURE (Right)  Patient Location: PACU  Anesthesia Type:MAC and Regional  Level of Consciousness: awake, alert  and oriented  Airway & Oxygen Therapy: Patient Spontanous Breathing  Post-op Assessment: Report given to RN and Post -op Vital signs reviewed and stable  Post vital signs: Reviewed and stable  Last Vitals:  Filed Vitals:   06/03/15 1511  BP:   Pulse:   Temp: 36.5 C  Resp:     Complications: No apparent anesthesia complications

## 2015-06-03 NOTE — Op Note (Signed)
PATIENT ID:      FIRMAN PETROW  MRN:     [id number] DOB/AGE:    Jan 02, 1961 / 54 y.o.       OPERATIVE REPORT    DATE OF PROCEDURE:  06/03/2015       PREOPERATIVE DIAGNOSIS:CLOSED, DISPLACED   RIGHT DISTAL FIBULA FRACTURE                                                       Estimated body mass index is 45.46 kg/(m^2) as calculated from the following:   Height as of this encounter: '5\' 9"'$  (1.753 m).   Weight as of this encounter: 139.708 kg (308 lb).     POSTOPERATIVE DIAGNOSIS:   RIGHT DISTAL FIBULA FRACTURE-SAME                                                                     Estimated body mass index is 45.46 kg/(m^2) as calculated from the following:   Height as of this encounter: '5\' 9"'$  (1.753 m).   Weight as of this encounter: 139.708 kg (308 lb).     PROCEDURE:  Procedure(s): OPEN REDUCTION INTERNAL FIXATION (ORIF) RIGHT DISTAL FIBULA  FRACTURE      SURGEON:  Joni Fears, MD    ASSISTANT:   Biagio Borg, PA-C   (Present and scrubbed throughout the case, critical for assistance with exposure, retraction, instrumentation, and closure.)          ANESTHESIA: local, regional and IV sedation     DRAINS: none :      TOURNIQUET TIME:     COMPLICATIONS:  None   CONDITION:  stable  PROCEDURE IN DETAIL: [id number]   Cedric Denison W 06/03/2015, 2:39 PM

## 2015-06-03 NOTE — Anesthesia Postprocedure Evaluation (Signed)
  Anesthesia Post-op Note  Patient: [name]  Procedure(s) Performed: Procedure(s): OPEN REDUCTION INTERNAL FIXATION (ORIF) DISTAL FIBULA  FRACTURE (Right)  Patient Location: PACU  Anesthesia Type:MAC and Regional  Level of Consciousness: awake  Airway and Oxygen Therapy: Patient Spontanous Breathing  Post-op Pain: none  Post-op Assessment: Post-op Vital signs reviewed, Patient's Cardiovascular Status Stable, Patent Airway, No signs of Nausea or vomiting and Pain level controlled     RLE Motor Response: Responds to commands (wiggles toes ) RLE Sensation: Decreased, Other (Comment) (can feel touch, but reports dullness r/t block)      Post-op Vital Signs: Reviewed and stable  Last Vitals:  Filed Vitals:   06/03/15 1600  BP: 114/72  Pulse: 72  Temp:   Resp: 16    Complications: No apparent anesthesia complications

## 2015-06-03 NOTE — H&P (Signed)
  The recent History & Physical has been reviewed. I have personally examined the patient today. There is no interval change to the documented History & Physical. The patient would like to proceed with the procedure.  Joni Fears W 06/03/2015,  12:15 PM

## 2015-06-04 ENCOUNTER — Encounter (HOSPITAL_COMMUNITY): Payer: Self-pay | Admitting: Orthopaedic Surgery

## 2015-06-04 NOTE — Op Note (Signed)
NAMESIDDHARTHA, HOBACK            ACCOUNT NO.:  [id number]  MEDICAL RECORD NO.:  [id number]  LOCATION:  [facility]                         FACILITY:  [facility]  PHYSICIAN:  Vonna Kotyk. Marilin Kofman, M.D.DATE OF BIRTH:  [birth year]  DATE OF PROCEDURE:  06/03/2015 DATE OF DISCHARGE:  06/03/2015                              OPERATIVE REPORT   PREOPERATIVE DIAGNOSIS:  Closed, displaced right fibular fracture.  POSTOPERATIVE DIAGNOSIS:  Closed, displaced right fibular fracture.  PROCEDURE:  Open reduction and internal fixation.  SURGEON:  Vonna Kotyk. Durward Fortes, MD.  ASSISTANT:  Biagio Borg, PA-C.  ANESTHESIA:  Popliteal block, IV sedation, and local Marcaine.  COMPLICATIONS:  None.  PROCEDURE IN DETAIL:  Mr. Madan was met with his sister in the holding area, identified the right ankle as the proper operative site, marked accordingly.  Skin was intact.  He received a preoperative popliteal nerve block per Anesthesia.  The patient was then transported to room #7; and with IV sedation, the right lower extremity was then placed in a leg tourniquet.  We prepped the leg with chlorhexidine scrub and DuraPrep x2 from the tips of the toes to the tourniquet just below the knee.  Sterile draping was performed.  Time was called.  We checked to be sure that he had adequate anesthesia along the lateral aspect of the distal fibula and then made about a 2 inch to 3 inch incision directly over the fracture site using image intensification as a guide.  Via sharp dissection, incision was carried down to subcutaneous tissue.  We did not use a tourniquet.  I did try an Esmarch, though I had a venous tourniquet and then proceeded without using any tourniquet.  Small bleeders were Bovie coagulated.  The skin was carefully retracted.  Using a subperiosteal elevator, the deep fascia was released, and I could then visualize the fracture.  The fracture was several weeks old.  So, there was some early callus.   This was just released so that I could reduce the fracture and maintain the fracture with a bone clamp.  I then inserted a transfixion screw from dorsal proximal to distal posterior and maintaining the fracture.  I then applied a 6-hole semitubular plate to the fracture, and then, sequentially drilled, measured and filled each of the 6 holes with screws.  The three distal proximal screws were cortical screws with the cancellous screw next to last a locking screw at the very end and then a cortical screw in the very center.  The fracture was nicely reduced.  We then obtained AP and lateral projections, thought that the transfixing screw was a little bit long and felt that it was not necessary at that point.  So, we removed it.  The wound was then irrigated with saline solution.  We again checked our position, felt that we had excellent position and good fixation.  The fascia was then closed with a running 0 Vicryl.  The wound was irrigated again.  The subcu was closed with 3-0 Monocryl.  Skin was closed with skin clips.  We did infiltrate the skin with 0.25% Marcaine without epinephrine.  Sterile bulky dressing was applied, followed by a short leg posterior splint and Ace bandage.  The patient was then transported to the operating stretcher and was returned to the PACU in satisfactory condition.  We expect to send him home tonight, given hydrocodone for pain, and see him in the office within a week.     Vonna Kotyk. Durward Fortes, M.D.     PWW/MEDQ  D:  06/03/2015  T:  06/03/2015  Job:  [id number]

## 2015-06-06 ENCOUNTER — Encounter (HOSPITAL_COMMUNITY): Payer: Self-pay | Admitting: Orthopaedic Surgery

## 2015-06-06 ENCOUNTER — Other Ambulatory Visit: Payer: Self-pay | Admitting: Nurse Practitioner

## 2015-06-09 ENCOUNTER — Encounter (HOSPITAL_COMMUNITY): Payer: Self-pay | Admitting: Orthopaedic Surgery

## 2015-06-11 ENCOUNTER — Other Ambulatory Visit (HOSPITAL_COMMUNITY): Payer: Self-pay | Admitting: Respiratory Therapy

## 2015-06-11 DIAGNOSIS — G4733 Obstructive sleep apnea (adult) (pediatric): Secondary | ICD-10-CM

## 2015-06-16 ENCOUNTER — Other Ambulatory Visit (HOSPITAL_COMMUNITY): Payer: Self-pay | Admitting: Respiratory Therapy

## 2015-07-29 ENCOUNTER — Ambulatory Visit: Payer: Medicare HMO | Attending: Neurology | Admitting: Sleep Medicine

## 2015-07-29 DIAGNOSIS — G4761 Periodic limb movement disorder: Secondary | ICD-10-CM | POA: Insufficient documentation

## 2015-07-29 DIAGNOSIS — G4733 Obstructive sleep apnea (adult) (pediatric): Secondary | ICD-10-CM | POA: Diagnosis not present

## 2015-08-01 ENCOUNTER — Other Ambulatory Visit (HOSPITAL_COMMUNITY): Payer: Self-pay | Admitting: Respiratory Therapy

## 2015-08-02 NOTE — Sleep Study (Signed)
  [place] A. Merlene Laughter, MD     [email or web address]             NOCTURNAL POLYSOMNOGRAPHY   LOCATION: [facility]   Patient Name: [name], [name] Date: 07/29/2015 Gender: Male D.O.B: [birth year] Age (years): 54 Referring Provider: Not Available Height (inches): 69 Interpreting Physician: Phillips Odor MD, ABSM Weight (lbs): 314 RPSGT: Rosebud Poles BMI: 46 MRN: [id number] Neck Size: 21.00 CLINICAL INFORMATION Sleep Study Type: NPSG  Indication for sleep study: N/A  Epworth Sleepiness Score: 19  SLEEP STUDY TECHNIQUE As per the AASM Manual for the Scoring of Sleep and Associated Events v2.3 (April 2016) with a hypopnea requiring 4% desaturations.  The channels recorded and monitored were frontal, central and occipital EEG, electrooculogram (EOG), submentalis EMG (chin), nasal and oral airflow, thoracic and abdominal wall motion, anterior tibialis EMG, snore microphone, electrocardiogram, and pulse oximetry.  MEDICATIONS Patient's medications include: N/A. Medications self-administered by patient during sleep study : No sleep medicine administered.  SLEEP ARCHITECTURE The study was initiated at 9:46:13 PM and ended at 4:26:40 AM.  Sleep onset time was 56.2 minutes and the sleep efficiency was 76.2%. The total sleep time was 305.0 minutes.  Stage REM latency was 321.5 minutes.  The patient spent 7.70% of the night in stage N1 sleep, 81.31% in stage N2 sleep, 4.26% in stage N3 and 6.72% in REM.  Alpha intrusion was absent.  Supine sleep was 40.10%.  RESPIRATORY PARAMETERS The overall apnea/hypopnea index (AHI) was 16.5 per hour. There were 5 total apneas, including 4 obstructive, 0 central and 1 mixed apneas. There were 79 hypopneas and 0 RERAs.  The AHI during Stage REM sleep was 20.5 per hour.  AHI while supine was 2.0 per hour.  The mean oxygen saturation was 89.69%. The minimum SpO2 during sleep was 0.00%.  Loud snoring was noted  during this study.  CARDIAC DATA The 2 lead EKG demonstrated sinus rhythm. The mean heart rate was N/A beats per minute. Other EKG findings include: None. LEG MOVEMENT DATA The total PLMS were 206 with a resulting PLMS index of 40.52. Associated arousal with leg movement index was 22.0.   IMPRESSIONS Moderate obstructive sleep apnea. A formal CPAP titration recording is suggested. Severe periodic limb movements of sleep occurred during the study. Associated arousals were significant.    Delano Metz, MD Diplomate, American Board of Sleep Medicine.

## 2015-08-06 ENCOUNTER — Other Ambulatory Visit (HOSPITAL_COMMUNITY): Payer: Self-pay | Admitting: Respiratory Therapy

## 2015-08-11 DIAGNOSIS — R413 Other amnesia: Secondary | ICD-10-CM | POA: Diagnosis not present

## 2015-08-11 DIAGNOSIS — R5381 Other malaise: Secondary | ICD-10-CM | POA: Diagnosis not present

## 2015-08-11 DIAGNOSIS — I2510 Atherosclerotic heart disease of native coronary artery without angina pectoris: Secondary | ICD-10-CM | POA: Diagnosis not present

## 2015-08-11 DIAGNOSIS — G4733 Obstructive sleep apnea (adult) (pediatric): Secondary | ICD-10-CM | POA: Diagnosis not present

## 2015-08-11 DIAGNOSIS — G4710 Hypersomnia, unspecified: Secondary | ICD-10-CM | POA: Diagnosis not present

## 2015-08-21 DIAGNOSIS — E785 Hyperlipidemia, unspecified: Secondary | ICD-10-CM | POA: Diagnosis not present

## 2015-08-21 DIAGNOSIS — J449 Chronic obstructive pulmonary disease, unspecified: Secondary | ICD-10-CM | POA: Diagnosis not present

## 2015-08-21 DIAGNOSIS — E119 Type 2 diabetes mellitus without complications: Secondary | ICD-10-CM | POA: Diagnosis not present

## 2015-08-21 DIAGNOSIS — I110 Hypertensive heart disease with heart failure: Secondary | ICD-10-CM | POA: Diagnosis not present

## 2015-08-21 DIAGNOSIS — K219 Gastro-esophageal reflux disease without esophagitis: Secondary | ICD-10-CM | POA: Diagnosis not present

## 2015-08-21 DIAGNOSIS — Z6841 Body Mass Index (BMI) 40.0 and over, adult: Secondary | ICD-10-CM | POA: Diagnosis not present

## 2015-08-21 DIAGNOSIS — I200 Unstable angina: Secondary | ICD-10-CM | POA: Diagnosis not present

## 2015-08-28 ENCOUNTER — Ambulatory Visit (INDEPENDENT_AMBULATORY_CARE_PROVIDER_SITE_OTHER): Payer: Medicare HMO | Admitting: Nurse Practitioner

## 2015-08-28 ENCOUNTER — Encounter: Payer: Self-pay | Admitting: Nurse Practitioner

## 2015-08-28 ENCOUNTER — Ambulatory Visit (INDEPENDENT_AMBULATORY_CARE_PROVIDER_SITE_OTHER): Payer: Medicare HMO | Admitting: Otolaryngology

## 2015-08-28 VITALS — BP 137/89 | HR 75 | Temp 99.3°F | Ht 69.0 in | Wt 300.2 lb

## 2015-08-28 DIAGNOSIS — K219 Gastro-esophageal reflux disease without esophagitis: Secondary | ICD-10-CM | POA: Diagnosis not present

## 2015-08-28 DIAGNOSIS — K2270 Barrett's esophagus without dysplasia: Secondary | ICD-10-CM | POA: Diagnosis not present

## 2015-08-28 DIAGNOSIS — R490 Dysphonia: Secondary | ICD-10-CM | POA: Diagnosis not present

## 2015-08-28 DIAGNOSIS — K2100 Gastro-esophageal reflux disease with esophagitis, without bleeding: Secondary | ICD-10-CM

## 2015-08-28 NOTE — Patient Instructions (Addendum)
1. Continue Protonix. You will likely need to be on this for life 2. Return for follow-up in one year. 3. As we discussed, he will be due for an endoscopy in 3 years either here or at [facility]. 4. Continue your excellent weight loss efforts.  5. Call us if he has before your routine follow-up.

## 2015-08-28 NOTE — Progress Notes (Signed)
Referring Provider: Sharilyn Sites, MD Primary Care Physician:  Purvis Kilts, MD Primary GI:  Dr. Oneida Alar  Chief Complaint  Patient presents with  . Follow-up    doing ok    HPI:   53 year old male presents for follow-up on atrophic gastritis and dysphagia. EGD with dilation completed 04/29/2015 which found Barrett's esophagus, moderate nonerosive gastritis. Surgical pathology found esophageal biopsy with intestinal metaplasia assisted with Barrett's esophagus. Stomach biopsy found mild chronic gastritis with intestinal metaplasia consistent with atrophic gastritis. Recommend low-fat diet, 4-6 small meals a day, continue Protonix 30 minutes prior to breakfast, follow-up in 4 months. Referral for gastric bypass was made at his last office visit and recommend repeat endoscopy in 3 years at [facility] if his BMI is less than 45 at [facility] if his BMI is greater than 45. He was recently admitted 06/02/2015 status post fall with a closed, displaced, right distal fibula fracture. He underwent surgery including upper reduction internal fixation of the right distal fibula.  Today he states his GERD symptoms are much improved. Occasional flare-up about once every 2-3 weeks. Is making a concerted effort to lose weight, has lost about 15 pounds objectively since last office visit 4 months ago. Has an appointment today with ENT. Denies abdominal pain. Has nausea without vomiting with no association to eating, typically once every few days. Denies hematochezia and melena. Rare abdominal pain which self-resolves. Denies lightheadedness, syncope, near syncope. Denies any other upper or lower GI symptoms.  Past Medical History  Diagnosis Date  . CAD (coronary artery disease) 2008    bypass grafting   . Morbid obesity ([place])   . PAF (paroxysmal atrial fibrillation) ([place])   . HTN (hypertension)   . COPD (chronic obstructive pulmonary disease) ([place])   . Hyperlipidemia   . S/P colonoscopy 2009   3-4 mm transverse colon erosions likely secondary to  ASA  . S/P endoscopy Dec 2011    moderate erosive gastritis, Barrett's esophagus 1-2cm  . Myocardial infarction ([place]) 2008,2009,2009  . Chronic kidney disease     hx of kidney stones  . GERD (gastroesophageal reflux disease)   . Barrett's esophagus   . Depression   . Glaucoma   . SOB (shortness of breath) 11/03/2007    2D Echo EF 50%-55%  . Abnormal myocardial perfusion study 01/01/2011    there a small to moderate sized inferobasal scar  . Diabetes ([place])     type 2 diabetes mellitus  . Claudication ([place]) 11/16/2011    PV test perform shows normal  . Cataracts, bilateral   . Dysrhythmia   . Hernia of abdominal wall   . Sleep apnea   . OSA (obstructive sleep apnea)     on cpap  . Stroke [facility])     Past Surgical History  Procedure Laterality Date  . Cabg x 4  03/2008  . Hernia repair      ventral hernia repair  . Coronary artery bypass graft  2008    4 vessels  . Cardiac catheterization  2009    stent placement to the left circumflex a 2.25   . Savory dilation  09/06/2011    Procedure: SAVORY DILATION;  Surgeon: Dorothyann Peng, MD;  Location: [facility];  Service: Endoscopy;  Laterality: N/A;  Dilated with 11m  . Esophagogastroduodenoscopy  09/2011    Barrett's esophagus, no dysplasia on biopsies. Distal esophagitis. Status post dilation. Moderate gastritis and duodenitis, but biopsies benign. Next EGD in November 2015 for surveillance  of Barrett's esophagus.  . Cardiac catheterization  07/08/2010  . Coronary stent placement  12/29/12  . Coronary stent placement  12/2012  . Left heart catheterization with coronary/graft angiogram N/A 12/29/2012    Procedure: LEFT HEART CATHETERIZATION WITH Beatrix Fetters;  Surgeon: Troy Sine, MD;  Location: [facility] CATH LAB;  Service: Cardiovascular;  Laterality: N/A;  . Percutaneous coronary stent intervention (pci-s)  12/29/2012    Procedure: PERCUTANEOUS CORONARY STENT INTERVENTION  (PCI-S);  Surgeon: Troy Sine, MD;  Location: [facility] CATH LAB;  Service: Cardiovascular;;  . Esophagogastroduodenoscopy (egd) with propofol N/A 04/29/2015    SLF: 1. Barretts esophagus 2. Moderate non-erosive gastritis.   . Esophageal dilation N/A 04/29/2015    Procedure: ESOPHAGEAL DILATION 15 mm, 16 mm;  Surgeon: Danie Binder, MD;  Location: [facility];  Service: Endoscopy;  Laterality: N/A;  . Esophageal biopsy N/A 04/29/2015    Procedure: BIOPSY;  Surgeon: Danie Binder, MD;  Location: [facility];  Service: Endoscopy;  Laterality: N/A;  . Orif fibula fracture Right 06/03/2015    Procedure: OPEN REDUCTION INTERNAL FIXATION (ORIF) DISTAL FIBULA  FRACTURE;  Surgeon: Garald Balding, MD;  Location: [facility];  Service: Orthopedics;  Laterality: Right;    Current Outpatient Prescriptions  Medication Sig Dispense Refill  . apixaban (ELIQUIS) 5 MG TABS tablet Take 1 tablet (5 mg total) by mouth 2 (two) times daily. 1 tablet 0  . Choline Fenofibrate (TRILIPIX) 135 MG capsule Take 135 mg by mouth daily.    . diazepam (VALIUM) 5 MG tablet Take 5 mg by mouth 2 (two) times daily.    Marland Kitchen escitalopram (LEXAPRO) 10 MG tablet Take 10 mg by mouth daily.    . furosemide (LASIX) 80 MG tablet Take 80 mg by mouth daily.  4  . HYDROcodone-acetaminophen (NORCO) 5-325 MG per tablet Take 1-2 tablets by mouth every 4 (four) hours as needed for moderate pain. 60 tablet 0  . isosorbide mononitrate (IMDUR) 60 MG 24 hr tablet Take 1 tablet (60 mg total) by mouth 3 (three) times daily. Please make an appointment for additional refills 45 tablet 0  . lisinopril (PRINIVIL,ZESTRIL) 5 MG tablet Take 5 mg by mouth daily.     . metoprolol (LOPRESSOR) 100 MG tablet Take 100 mg by mouth 2 (two) times daily.     . modafinil (PROVIGIL) 100 MG tablet Take 100 mg by mouth 2 (two) times daily as needed (shortness of breath).     . nitroGLYCERIN (NITROLINGUAL) 0.4 MG/SPRAY spray Place 1 spray under the tongue every 5 (five) minutes x 3 doses as  needed for chest pain.    . pantoprazole (PROTONIX) 40 MG tablet TAKE 1 TABLET BY MOUTH 30 MINUTES PRIOR TO BREAKFAST 30 tablet 5  . potassium chloride SA (K-DUR,KLOR-CON) 20 MEQ tablet Take 1 tablet (20 mEq total) by mouth 2 (two) times daily. 30 tablet 0  . ranolazine (RANEXA) 1000 MG SR tablet Take 1 tablet (1,000 mg total) by mouth 2 (two) times daily. NEED APPOINTMENT FOR FUTURE REFILLS 30 tablet 0  . rosuvastatin (CRESTOR) 20 MG tablet Take 1 tablet (20 mg total) by mouth daily. Needs appointment for further refills 30 tablet 0  . sitaGLIPtin (JANUVIA) 100 MG tablet Take 100 mg by mouth daily.     Marland Kitchen Umeclidinium-Vilanterol 62.5-25 MCG/INH AEPB Inhale 1 puff into the lungs daily as needed (wheezing). Anoro Ellipta     No current facility-administered medications for this visit.    Allergies as of 08/28/2015 - Review  Complete 08/28/2015  Allergen Reaction Noted  . Contrast media [iodinated diagnostic agents] Other (See Comments) 03/14/2013  . Iohexol Other (See Comments) 01/02/2008    Family History  Problem Relation Age of Onset  . Colon cancer Neg Hx   . Anesthesia problems Neg Hx   . Hypotension Neg Hx   . Malignant hyperthermia Neg Hx   . Pseudochol deficiency Neg Hx   . Diabetes Mother   . Heart disease Mother   . Diabetes Father   . Heart disease Father     Social History   Social History  . Marital Status: Divorced    Spouse Name: N/A  . Number of Children: N/A  . Years of Education: N/A   Social History Main Topics  . Smoking status: Current Every Day Smoker -- 0.50 packs/day for 30 years    Types: Cigarettes  . Smokeless tobacco: None  . Alcohol Use: Yes     Comment: every other day, one drink of beer or liquor  . Drug Use: No  . Sexual Activity: No   Other Topics Concern  . None   Social History Narrative    Review of Systems: 10-point ROS negative except as per HPI.   Physical Exam: BP 137/89 mmHg  Pulse 75  Temp(Src) 99.3 F (37.4 C) (Oral)   Ht '5\' 9"'$  (1.753 m)  Wt 300 lb 3.2 oz (136.17 kg)  BMI 44.31 kg/m2 General:   Alert and oriented. orbidly ovese male. Pleasant and cooperative. Well-nourished and well-developed.  Head:  Normocephalic and atraumatic. Cardiovascular:  S1, S2 present without murmurs appreciated.  Extremities without clubbing or edema. Respiratory:  Minimal bilateral wheezes. No rales or rhonchi. No distress.  Gastrointestinal:  +BS, morbidly obese, soft, non-tender and non-distended. No HSM noted. No guarding or rebound. No masses appreciated.  Rectal:  Deferred  Skin:  Intact without significant lesions or rashes. Neurologic:  Alert and oriented x4;  grossly normal neurologically. Psych:  Alert and cooperative. Normal mood and affect. Heme/Lymph/Immune: No excessive bruising noted.    08/28/2015 10:57 AM

## 2015-08-28 NOTE — Assessment & Plan Note (Signed)
Them showed Barrett's esophagus on recent endoscopy. Remains on PPI which he is taking regularly. Explained the need to take this for life due to Barrett's esophagus as well as the increased risk of cancer due to Barrett's esophagus although overall low risk of cancer. We'll be due for repeat surveillance endoscopy in 3 years either at [facility] ordered [facility], depending on his BMI that time. Is increased BMI is increased risk of anesthesia complications. Has lost 15 pounds in the past 4 months intentionally and continues weight loss efforts. Return for follow-up in one year or sooner if needed for worsening or returning symptoms.

## 2015-08-28 NOTE — Assessment & Plan Note (Signed)
Patient with history of GERD with gastritis demonstrated on recent endoscopy. GERD symptoms are much improved, occasional flareup about once every 2-3 weeks. Has made a concerted effort to lose weight and has lost about 15 pounds in 4 months intentionally. Continue weight loss will likely improve his GERD symptoms. Continue PPI for life due to Barrett's esophagus as noted below. Return for follow-up in one year or sooner if any worsening or change in symptoms.

## 2015-08-28 NOTE — Progress Notes (Signed)
cc'ed to pcp °

## 2015-09-10 DIAGNOSIS — S82832S Other fracture of upper and lower end of left fibula, sequela: Secondary | ICD-10-CM | POA: Diagnosis not present

## 2015-10-23 DIAGNOSIS — Z23 Encounter for immunization: Secondary | ICD-10-CM | POA: Diagnosis not present

## 2015-10-23 DIAGNOSIS — R5381 Other malaise: Secondary | ICD-10-CM | POA: Diagnosis not present

## 2015-10-23 DIAGNOSIS — Z6841 Body Mass Index (BMI) 40.0 and over, adult: Secondary | ICD-10-CM | POA: Diagnosis not present

## 2015-10-23 DIAGNOSIS — I6789 Other cerebrovascular disease: Secondary | ICD-10-CM | POA: Diagnosis not present

## 2015-10-23 DIAGNOSIS — E782 Mixed hyperlipidemia: Secondary | ICD-10-CM | POA: Diagnosis not present

## 2015-10-23 DIAGNOSIS — I2510 Atherosclerotic heart disease of native coronary artery without angina pectoris: Secondary | ICD-10-CM | POA: Diagnosis not present

## 2015-10-23 DIAGNOSIS — I10 Essential (primary) hypertension: Secondary | ICD-10-CM | POA: Diagnosis not present

## 2015-10-23 DIAGNOSIS — R413 Other amnesia: Secondary | ICD-10-CM | POA: Diagnosis not present

## 2015-10-23 DIAGNOSIS — Z1389 Encounter for screening for other disorder: Secondary | ICD-10-CM | POA: Diagnosis not present

## 2015-10-23 DIAGNOSIS — G4710 Hypersomnia, unspecified: Secondary | ICD-10-CM | POA: Diagnosis not present

## 2015-10-23 DIAGNOSIS — E118 Type 2 diabetes mellitus with unspecified complications: Secondary | ICD-10-CM | POA: Diagnosis not present

## 2015-10-23 DIAGNOSIS — G4733 Obstructive sleep apnea (adult) (pediatric): Secondary | ICD-10-CM | POA: Diagnosis not present

## 2015-10-24 ENCOUNTER — Other Ambulatory Visit (HOSPITAL_COMMUNITY): Payer: Self-pay | Admitting: Respiratory Therapy

## 2015-10-24 DIAGNOSIS — G4733 Obstructive sleep apnea (adult) (pediatric): Secondary | ICD-10-CM

## 2015-10-30 ENCOUNTER — Other Ambulatory Visit (HOSPITAL_COMMUNITY): Payer: Self-pay | Admitting: Pulmonary Disease

## 2015-10-30 DIAGNOSIS — R911 Solitary pulmonary nodule: Secondary | ICD-10-CM

## 2015-11-02 DIAGNOSIS — I639 Cerebral infarction, unspecified: Secondary | ICD-10-CM

## 2015-11-02 HISTORY — DX: Cerebral infarction, unspecified: I63.9

## 2015-11-03 IMAGING — RF DG C-ARM 61-120 MIN
1 series · 3 of 3 positions shown · non-contrast
Comparison: 05/23/2015 radiograph

CLINICAL DATA: Intraoperative fixation, distal fibular fracture

EXAM:
DG C-ARM 61-120 MIN; RIGHT ANKLE - COMPLETE 3+ VIEW

[Series 1: run · 3 of 3 slices shown]
[im 1/3]
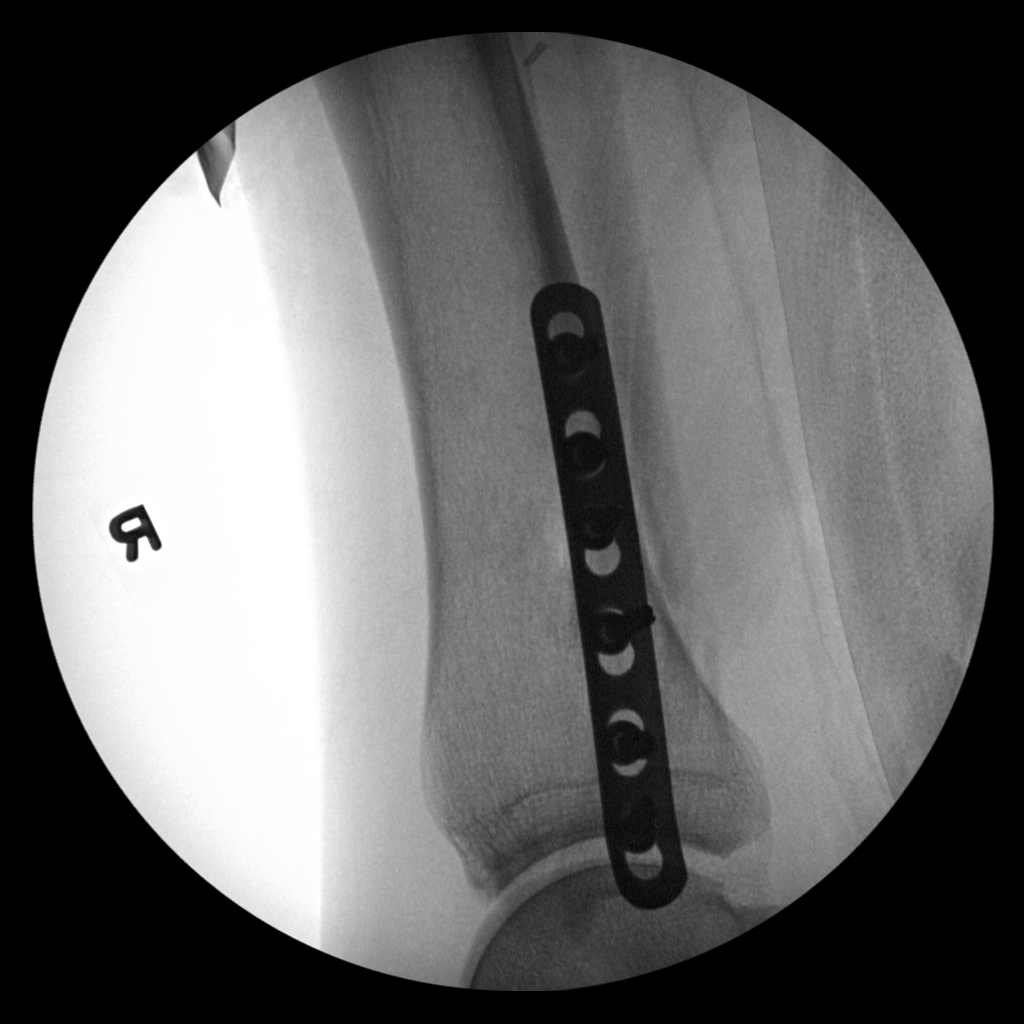
[im 2/3]
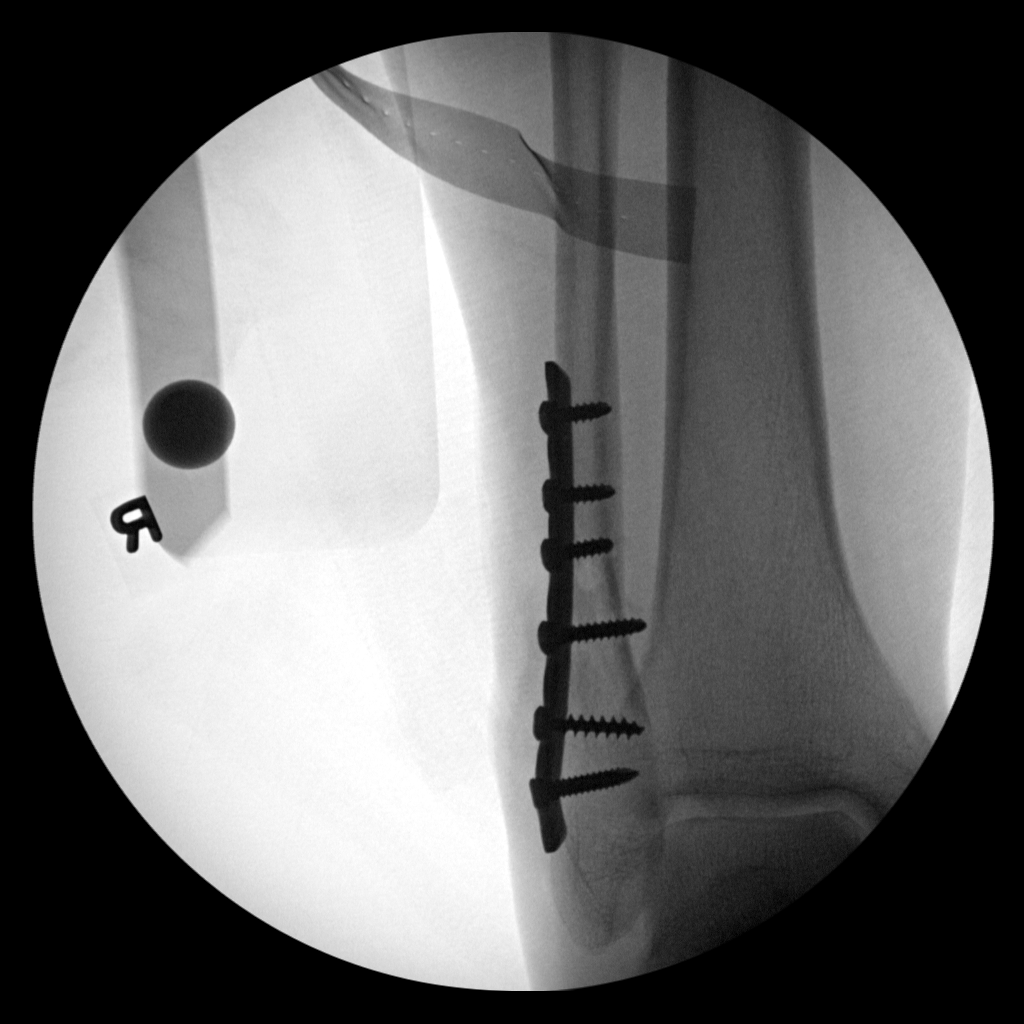
[im 3/3]
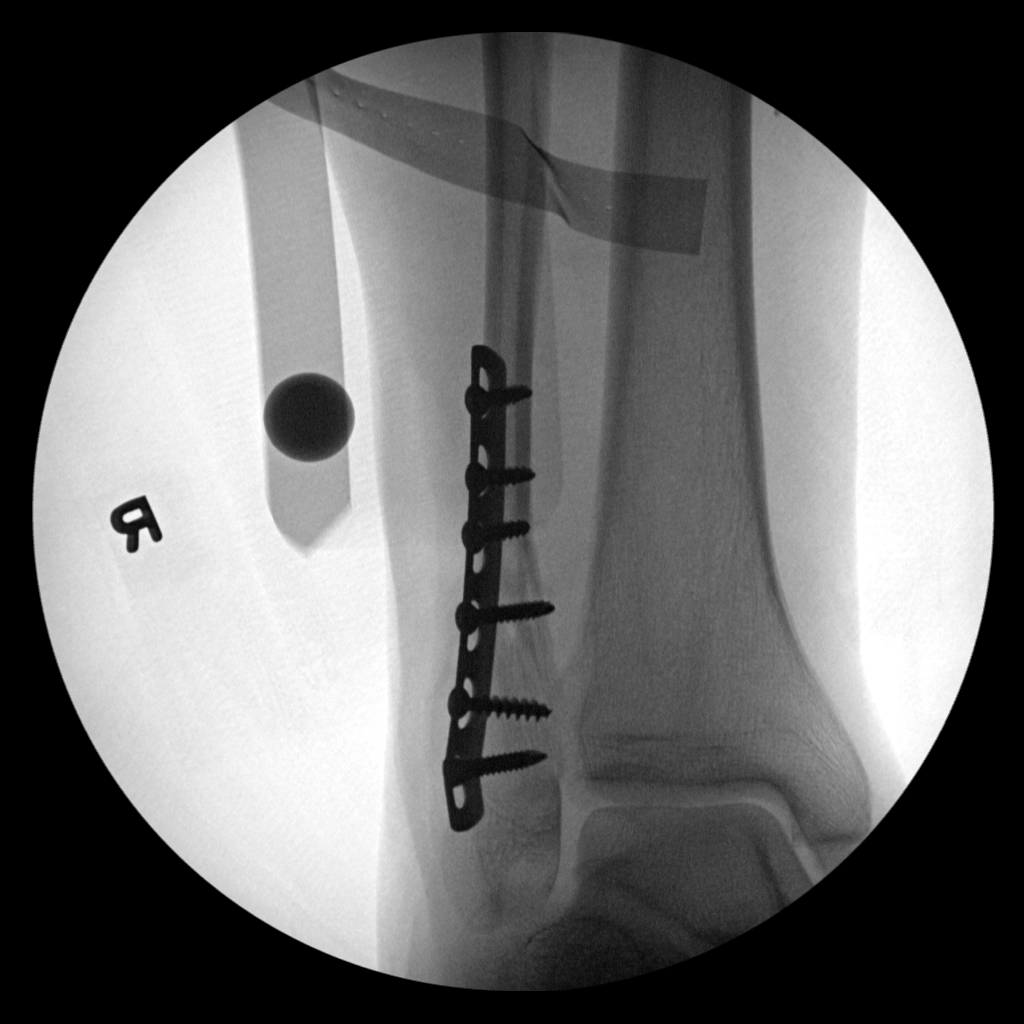

[3 of 3 positions shown; findings below may reference images not displayed]

FINDINGS: Three intraoperative fluoroscopic images demonstrate side plate and
screw fixation of the previously seen oblique distal right fibular
fracture. Fracture fragments are in near anatomic alignment. No
evidence for hardware failure.
IMPRESSION: Intraoperative imaging as above.

## 2015-11-04 ENCOUNTER — Ambulatory Visit (HOSPITAL_COMMUNITY): Payer: Medicare HMO

## 2015-11-13 ENCOUNTER — Ambulatory Visit (HOSPITAL_COMMUNITY): Admission: RE | Admit: 2015-11-13 | Payer: Medicare HMO | Source: Ambulatory Visit

## 2015-11-14 ENCOUNTER — Ambulatory Visit (HOSPITAL_COMMUNITY)
Admission: RE | Admit: 2015-11-14 | Discharge: 2015-11-14 | Disposition: A | Discharge status: Home / Self Care | Payer: Medicare HMO | Source: Ambulatory Visit | Attending: Pulmonary Disease | Admitting: Pulmonary Disease

## 2015-11-14 DIAGNOSIS — R918 Other nonspecific abnormal finding of lung field: Secondary | ICD-10-CM | POA: Insufficient documentation

## 2015-11-14 DIAGNOSIS — I2510 Atherosclerotic heart disease of native coronary artery without angina pectoris: Secondary | ICD-10-CM | POA: Diagnosis not present

## 2015-11-14 DIAGNOSIS — J439 Emphysema, unspecified: Secondary | ICD-10-CM | POA: Insufficient documentation

## 2015-11-14 DIAGNOSIS — R911 Solitary pulmonary nodule: Secondary | ICD-10-CM

## 2015-11-20 DIAGNOSIS — Z8679 Personal history of other diseases of the circulatory system: Secondary | ICD-10-CM | POA: Diagnosis not present

## 2015-11-20 DIAGNOSIS — Z951 Presence of aortocoronary bypass graft: Secondary | ICD-10-CM | POA: Diagnosis not present

## 2015-11-20 DIAGNOSIS — R0602 Shortness of breath: Secondary | ICD-10-CM | POA: Diagnosis not present

## 2015-12-24 DIAGNOSIS — R5381 Other malaise: Secondary | ICD-10-CM | POA: Diagnosis not present

## 2015-12-24 DIAGNOSIS — R413 Other amnesia: Secondary | ICD-10-CM | POA: Diagnosis not present

## 2015-12-24 DIAGNOSIS — I6789 Other cerebrovascular disease: Secondary | ICD-10-CM | POA: Diagnosis not present

## 2015-12-24 DIAGNOSIS — I2510 Atherosclerotic heart disease of native coronary artery without angina pectoris: Secondary | ICD-10-CM | POA: Diagnosis not present

## 2015-12-24 DIAGNOSIS — G4710 Hypersomnia, unspecified: Secondary | ICD-10-CM | POA: Diagnosis not present

## 2015-12-24 DIAGNOSIS — G4733 Obstructive sleep apnea (adult) (pediatric): Secondary | ICD-10-CM | POA: Diagnosis not present

## 2016-01-08 ENCOUNTER — Other Ambulatory Visit (HOSPITAL_COMMUNITY): Payer: Self-pay | Admitting: Physician Assistant

## 2016-01-08 ENCOUNTER — Ambulatory Visit (HOSPITAL_COMMUNITY)
Admission: RE | Admit: 2016-01-08 | Discharge: 2016-01-08 | Disposition: A | Discharge status: Home / Self Care | Payer: Medicare HMO | Source: Ambulatory Visit | Attending: Physician Assistant | Admitting: Physician Assistant

## 2016-01-08 DIAGNOSIS — Z6841 Body Mass Index (BMI) 40.0 and over, adult: Secondary | ICD-10-CM | POA: Diagnosis not present

## 2016-01-08 DIAGNOSIS — M25512 Pain in left shoulder: Secondary | ICD-10-CM | POA: Diagnosis not present

## 2016-01-08 DIAGNOSIS — R52 Pain, unspecified: Secondary | ICD-10-CM

## 2016-01-08 DIAGNOSIS — S4352XA Sprain of left acromioclavicular joint, initial encounter: Secondary | ICD-10-CM | POA: Insufficient documentation

## 2016-01-08 DIAGNOSIS — Z1389 Encounter for screening for other disorder: Secondary | ICD-10-CM | POA: Diagnosis not present

## 2016-01-15 ENCOUNTER — Other Ambulatory Visit: Payer: Self-pay | Admitting: Gastroenterology

## 2016-01-15 DIAGNOSIS — Z1389 Encounter for screening for other disorder: Secondary | ICD-10-CM | POA: Diagnosis not present

## 2016-01-15 DIAGNOSIS — E1165 Type 2 diabetes mellitus with hyperglycemia: Secondary | ICD-10-CM | POA: Diagnosis not present

## 2016-01-15 DIAGNOSIS — E118 Type 2 diabetes mellitus with unspecified complications: Secondary | ICD-10-CM | POA: Diagnosis not present

## 2016-01-15 DIAGNOSIS — I10 Essential (primary) hypertension: Secondary | ICD-10-CM | POA: Diagnosis not present

## 2016-01-15 DIAGNOSIS — Z6841 Body Mass Index (BMI) 40.0 and over, adult: Secondary | ICD-10-CM | POA: Diagnosis not present

## 2016-01-15 DIAGNOSIS — E782 Mixed hyperlipidemia: Secondary | ICD-10-CM | POA: Diagnosis not present

## 2016-01-21 DIAGNOSIS — G4733 Obstructive sleep apnea (adult) (pediatric): Secondary | ICD-10-CM | POA: Diagnosis not present

## 2016-01-21 DIAGNOSIS — G4710 Hypersomnia, unspecified: Secondary | ICD-10-CM | POA: Diagnosis not present

## 2016-01-21 DIAGNOSIS — I6789 Other cerebrovascular disease: Secondary | ICD-10-CM | POA: Diagnosis not present

## 2016-01-21 DIAGNOSIS — R5381 Other malaise: Secondary | ICD-10-CM | POA: Diagnosis not present

## 2016-01-21 DIAGNOSIS — I2510 Atherosclerotic heart disease of native coronary artery without angina pectoris: Secondary | ICD-10-CM | POA: Diagnosis not present

## 2016-01-21 DIAGNOSIS — R413 Other amnesia: Secondary | ICD-10-CM | POA: Diagnosis not present

## 2016-01-23 DIAGNOSIS — G4733 Obstructive sleep apnea (adult) (pediatric): Secondary | ICD-10-CM | POA: Diagnosis not present

## 2016-01-24 DIAGNOSIS — G4733 Obstructive sleep apnea (adult) (pediatric): Secondary | ICD-10-CM | POA: Diagnosis not present

## 2016-01-25 DIAGNOSIS — G4733 Obstructive sleep apnea (adult) (pediatric): Secondary | ICD-10-CM | POA: Diagnosis not present

## 2016-01-29 DIAGNOSIS — M25512 Pain in left shoulder: Secondary | ICD-10-CM | POA: Diagnosis not present

## 2016-02-13 ENCOUNTER — Emergency Department (HOSPITAL_COMMUNITY): Payer: Medicare HMO

## 2016-02-13 ENCOUNTER — Observation Stay (HOSPITAL_COMMUNITY)
Admission: EM | Admit: 2016-02-13 | Discharge: 2016-02-13 | Disposition: A | Discharge status: Home / Self Care | Payer: Medicare HMO | Attending: Family Medicine | Admitting: Family Medicine

## 2016-02-13 ENCOUNTER — Encounter (HOSPITAL_COMMUNITY): Payer: Self-pay

## 2016-02-13 DIAGNOSIS — E1151 Type 2 diabetes mellitus with diabetic peripheral angiopathy without gangrene: Secondary | ICD-10-CM | POA: Insufficient documentation

## 2016-02-13 DIAGNOSIS — I2510 Atherosclerotic heart disease of native coronary artery without angina pectoris: Secondary | ICD-10-CM | POA: Diagnosis not present

## 2016-02-13 DIAGNOSIS — N189 Chronic kidney disease, unspecified: Secondary | ICD-10-CM | POA: Diagnosis not present

## 2016-02-13 DIAGNOSIS — E785 Hyperlipidemia, unspecified: Secondary | ICD-10-CM | POA: Insufficient documentation

## 2016-02-13 DIAGNOSIS — G4733 Obstructive sleep apnea (adult) (pediatric): Secondary | ICD-10-CM | POA: Diagnosis not present

## 2016-02-13 DIAGNOSIS — I480 Paroxysmal atrial fibrillation: Secondary | ICD-10-CM | POA: Diagnosis not present

## 2016-02-13 DIAGNOSIS — I252 Old myocardial infarction: Secondary | ICD-10-CM | POA: Diagnosis not present

## 2016-02-13 DIAGNOSIS — R079 Chest pain, unspecified: Secondary | ICD-10-CM | POA: Diagnosis present

## 2016-02-13 DIAGNOSIS — E669 Obesity, unspecified: Secondary | ICD-10-CM | POA: Diagnosis present

## 2016-02-13 DIAGNOSIS — I4891 Unspecified atrial fibrillation: Secondary | ICD-10-CM

## 2016-02-13 DIAGNOSIS — J441 Chronic obstructive pulmonary disease with (acute) exacerbation: Secondary | ICD-10-CM | POA: Diagnosis not present

## 2016-02-13 DIAGNOSIS — I129 Hypertensive chronic kidney disease with stage 1 through stage 4 chronic kidney disease, or unspecified chronic kidney disease: Secondary | ICD-10-CM | POA: Insufficient documentation

## 2016-02-13 DIAGNOSIS — J90 Pleural effusion, not elsewhere classified: Secondary | ICD-10-CM | POA: Diagnosis not present

## 2016-02-13 DIAGNOSIS — F329 Major depressive disorder, single episode, unspecified: Secondary | ICD-10-CM | POA: Insufficient documentation

## 2016-02-13 DIAGNOSIS — J449 Chronic obstructive pulmonary disease, unspecified: Secondary | ICD-10-CM | POA: Insufficient documentation

## 2016-02-13 DIAGNOSIS — R69 Illness, unspecified: Secondary | ICD-10-CM | POA: Diagnosis not present

## 2016-02-13 DIAGNOSIS — Z79899 Other long term (current) drug therapy: Secondary | ICD-10-CM | POA: Insufficient documentation

## 2016-02-13 DIAGNOSIS — F17210 Nicotine dependence, cigarettes, uncomplicated: Secondary | ICD-10-CM | POA: Diagnosis not present

## 2016-02-13 DIAGNOSIS — Z8673 Personal history of transient ischemic attack (TIA), and cerebral infarction without residual deficits: Secondary | ICD-10-CM | POA: Insufficient documentation

## 2016-02-13 DIAGNOSIS — R0789 Other chest pain: Secondary | ICD-10-CM | POA: Diagnosis not present

## 2016-02-13 DIAGNOSIS — R0602 Shortness of breath: Secondary | ICD-10-CM

## 2016-02-13 LAB — CBC
HCT: 46.6 % (ref 39.0–52.0)
HEMOGLOBIN: 16.4 g/dL (ref 13.0–17.0)
MCH: 34 pg (ref 26.0–34.0)
MCHC: 35.2 g/dL (ref 30.0–36.0)
MCV: 96.5 fL (ref 78.0–100.0)
Platelets: 277 10*3/uL (ref 150–400)
RBC: 4.83 MIL/uL (ref 4.22–5.81)
RDW: 13 % (ref 11.5–15.5)
WBC: 13.3 10*3/uL — ABNORMAL HIGH (ref 4.0–10.5)

## 2016-02-13 LAB — BASIC METABOLIC PANEL
ANION GAP: 14 (ref 5–15)
BUN: 12 mg/dL (ref 6–20)
CALCIUM: 8.7 mg/dL — AB (ref 8.9–10.3)
CO2: 22 mmol/L (ref 22–32)
Chloride: 98 mmol/L — ABNORMAL LOW (ref 101–111)
Creatinine, Ser: 1.13 mg/dL (ref 0.61–1.24)
GFR calc Af Amer: >60 mL/min (ref >60)
GFR calc non Af Amer: >60 mL/min (ref >60)
GLUCOSE: 169 mg/dL — AB (ref 65–99)
Potassium: 3.1 mmol/L — ABNORMAL LOW (ref 3.5–5.1)
Sodium: 134 mmol/L — ABNORMAL LOW (ref 135–145)

## 2016-02-13 LAB — TROPONIN I
Troponin I: <0.03 ng/mL (ref <0.031)
Troponin I: <0.03 ng/mL (ref <0.031)
Troponin I: <0.03 ng/mL (ref <0.031)

## 2016-02-13 LAB — I-STAT TROPONIN, ED: TROPONIN I, POC: 0 ng/mL (ref 0.00–0.08)

## 2016-02-13 LAB — BRAIN NATRIURETIC PEPTIDE: B Natriuretic Peptide: 20 pg/mL (ref 0.0–100.0)

## 2016-02-13 MED ORDER — APIXABAN 5 MG PO TABS
5.0000 mg | ORAL_TABLET | Freq: Two times a day (BID) | ORAL | Status: DC
  Administered 2016-02-13: 5 mg via ORAL
  Filled 2016-02-13: qty 1

## 2016-02-13 MED ORDER — METOPROLOL TARTRATE 50 MG PO TABS: 100.0000 mg | ORAL_TABLET | Freq: Two times a day (BID) | ORAL | Status: DC

## 2016-02-13 MED ORDER — METOPROLOL TARTRATE 25 MG PO TABS
25.0000 mg | ORAL_TABLET | Freq: Two times a day (BID) | ORAL | Status: DC
  Administered 2016-02-13: 25 mg via ORAL
  Filled 2016-02-13: qty 1

## 2016-02-13 MED ORDER — METHYLPREDNISOLONE SODIUM SUCC 125 MG IJ SOLR
125.0000 mg | Freq: Once | INTRAMUSCULAR | Status: AC
  Administered 2016-02-13: 125 mg via INTRAVENOUS
  Filled 2016-02-13: qty 2

## 2016-02-13 MED ORDER — POTASSIUM CHLORIDE CRYS ER 20 MEQ PO TBCR
40.0000 meq | EXTENDED_RELEASE_TABLET | Freq: Once | ORAL | Status: AC
  Administered 2016-02-13: 40 meq via ORAL
  Filled 2016-02-13: qty 2

## 2016-02-13 MED ORDER — LISINOPRIL 5 MG PO TABS: 2.5000 mg | ORAL_TABLET | Freq: Every day | ORAL | Status: DC

## 2016-02-13 MED ORDER — ROSUVASTATIN CALCIUM 20 MG PO TABS
20.0000 mg | ORAL_TABLET | Freq: Every day | ORAL | Status: DC
  Administered 2016-02-13: 20 mg via ORAL
  Filled 2016-02-13: qty 1

## 2016-02-13 MED ORDER — AMLODIPINE BESYLATE 2.5 MG PO TABS: 2.5000 mg | ORAL_TABLET | Freq: Every day | ORAL | Status: DC

## 2016-02-13 MED ORDER — ROSUVASTATIN CALCIUM 20 MG PO TABS: 20.0000 mg | ORAL_TABLET | Freq: Every day | ORAL | Status: DC

## 2016-02-13 MED ORDER — GI COCKTAIL ~~LOC~~
30.0000 mL | Freq: Four times a day (QID) | ORAL | Status: DC | PRN
Start: 2016-02-13 — End: 2016-02-13

## 2016-02-13 MED ORDER — ONDANSETRON HCL 4 MG/2ML IJ SOLN: 4.0000 mg | Freq: Four times a day (QID) | INTRAMUSCULAR | Status: DC | PRN

## 2016-02-13 MED ORDER — ACETAMINOPHEN 325 MG PO TABS: 650.0000 mg | ORAL_TABLET | ORAL | Status: DC | PRN

## 2016-02-13 MED ORDER — ALBUTEROL (5 MG/ML) CONTINUOUS INHALATION SOLN
10.0000 mg/h | INHALATION_SOLUTION | RESPIRATORY_TRACT | Status: DC
  Administered 2016-02-13: 10 mg/h via RESPIRATORY_TRACT
  Filled 2016-02-13: qty 20

## 2016-02-13 MED ORDER — FUROSEMIDE 80 MG PO TABS
80.0000 mg | ORAL_TABLET | Freq: Every day | ORAL | Status: DC
  Administered 2016-02-13: 80 mg via ORAL
  Filled 2016-02-13: qty 1

## 2016-02-13 MED ORDER — FENTANYL CITRATE (PF) 100 MCG/2ML IJ SOLN
50.0000 ug | Freq: Once | INTRAMUSCULAR | Status: AC
  Administered 2016-02-13: 50 ug via INTRAVENOUS
  Filled 2016-02-13: qty 2

## 2016-02-13 MED ORDER — RANOLAZINE ER 500 MG PO TB12
1000.0000 mg | ORAL_TABLET | Freq: Two times a day (BID) | ORAL | Status: DC
  Administered 2016-02-13: 1000 mg via ORAL
  Filled 2016-02-13: qty 2

## 2016-02-13 MED ORDER — ASPIRIN 81 MG PO CHEW
324.0000 mg | CHEWABLE_TABLET | Freq: Once | ORAL | Status: AC
  Administered 2016-02-13: 324 mg via ORAL
  Filled 2016-02-13: qty 4

## 2016-02-13 MED ORDER — AMLODIPINE BESYLATE 5 MG PO TABS
2.5000 mg | ORAL_TABLET | Freq: Every day | ORAL | Status: DC
  Administered 2016-02-13: 2.5 mg via ORAL
  Filled 2016-02-13: qty 1

## 2016-02-13 MED ORDER — ASPIRIN EC 325 MG PO TBEC
325.0000 mg | DELAYED_RELEASE_TABLET | Freq: Every day | ORAL | Status: DC
  Administered 2016-02-13: 325 mg via ORAL
  Filled 2016-02-13: qty 1

## 2016-02-13 NOTE — ED Notes (Signed)
Patient stated that he was having sharp chest pains when taking vitals. Patient was having PVC's. Repeat EKG done and notified EDP and RN.

## 2016-02-13 NOTE — H&P (Signed)
PCP:   Purvis Kilts, MD   Chief Complaint:  Chest pain  HPI: 55 yo male with chronic angina on ranexa, CABG 4 vessel in 2008 with stenting of 2 of his venous grafts per patient report comes in with several hours of his typical anginal symptoms that was not relieved with his ntg spray.  Pt is having cost issues with his ranexa so his cardiologist at [facility] is transitioning his from ranexa to imdur due to cost.  He is still taking the ranexa however.  He normally has angina once a week and usually relieved with his spray.  Today he was having sscp that radiated to his jaw/left arm that lasted over 30 minutes until he got pain meds in the ED, which then relieved his pain.  Pt has been told he will need a heart transplant at some point by a cardiologist at [facility].  He has been told by the local cardiology groups [facility] and here in [place]) that "there is nothing they can do".  Pt continues to smoke.  He denies any le edema or swelling.  Pt referred for admission for his anginal symptoms.    Of note, per EDP he was wheezing some on arrival and given solumedrol and a neb.  Review of Systems:  Positive and negative as per HPI otherwise all other systems are negative  Past Medical History: Past Medical History  Diagnosis Date  . CAD (coronary artery disease) 2008    bypass grafting   . Morbid obesity ([place])   . PAF (paroxysmal atrial fibrillation) ([place])   . HTN (hypertension)   . COPD (chronic obstructive pulmonary disease) ([place])   . Hyperlipidemia   . S/P colonoscopy 2009    3-4 mm transverse colon erosions likely secondary to  ASA  . S/P endoscopy Dec 2011    moderate erosive gastritis, Barrett's esophagus 1-2cm  . Myocardial infarction ([place]) 2008,2009,2009  . Chronic kidney disease     hx of kidney stones  . GERD (gastroesophageal reflux disease)   . Barrett's esophagus   . Depression   . Glaucoma   . SOB (shortness of breath) 11/03/2007    2D Echo EF 50%-55%  .  Abnormal myocardial perfusion study 01/01/2011    there a small to moderate sized inferobasal scar  . Diabetes ([place])     type 2 diabetes mellitus  . Claudication ([place]) 11/16/2011    PV test perform shows normal  . Cataracts, bilateral   . Dysrhythmia   . Hernia of abdominal wall   . Sleep apnea   . OSA (obstructive sleep apnea)     on cpap  . Stroke [facility])    Past Surgical History  Procedure Laterality Date  . Cabg x 4  03/2008  . Hernia repair      ventral hernia repair  . Coronary artery bypass graft  2008    4 vessels  . Cardiac catheterization  2009    stent placement to the left circumflex a 2.25   . Savory dilation  09/06/2011    Procedure: SAVORY DILATION;  Surgeon: Dorothyann Peng, MD;  Location: [facility];  Service: Endoscopy;  Laterality: N/A;  Dilated with 49m  . Esophagogastroduodenoscopy  09/2011    Barrett's esophagus, no dysplasia on biopsies. Distal esophagitis. Status post dilation. Moderate gastritis and duodenitis, but biopsies benign. Next EGD in November 2015 for surveillance of Barrett's esophagus.  . Cardiac catheterization  07/08/2010  . Coronary stent placement  12/29/12  . Coronary  stent placement  12/2012  . Left heart catheterization with coronary/graft angiogram N/A 12/29/2012    Procedure: LEFT HEART CATHETERIZATION WITH Beatrix Fetters;  Surgeon: Troy Sine, MD;  Location: [facility] CATH LAB;  Service: Cardiovascular;  Laterality: N/A;  . Percutaneous coronary stent intervention (pci-s)  12/29/2012    Procedure: PERCUTANEOUS CORONARY STENT INTERVENTION (PCI-S);  Surgeon: Troy Sine, MD;  Location: [facility] CATH LAB;  Service: Cardiovascular;;  . Esophagogastroduodenoscopy (egd) with propofol N/A 04/29/2015    SLF: 1. Barretts esophagus 2. Moderate non-erosive gastritis.   . Esophageal dilation N/A 04/29/2015    Procedure: ESOPHAGEAL DILATION 15 mm, 16 mm;  Surgeon: Danie Binder, MD;  Location: [facility];  Service: Endoscopy;  Laterality: N/A;  . Biopsy N/A  04/29/2015    Procedure: BIOPSY;  Surgeon: Danie Binder, MD;  Location: [facility];  Service: Endoscopy;  Laterality: N/A;  . Orif fibula fracture Right 06/03/2015    Procedure: OPEN REDUCTION INTERNAL FIXATION (ORIF) DISTAL FIBULA  FRACTURE;  Surgeon: Garald Balding, MD;  Location: [facility];  Service: Orthopedics;  Laterality: Right;    Medications: Prior to Admission medications   Medication Sig Start Date End Date Taking? Authorizing Provider  apixaban (ELIQUIS) 5 MG TABS tablet Take 1 tablet (5 mg total) by mouth 2 (two) times daily. 06/04/15  Yes Mike Craze Petrarca, PA-C  diazepam (VALIUM) 5 MG tablet Take 5 mg by mouth 2 (two) times daily.   Yes Historical Provider, MD  escitalopram (LEXAPRO) 10 MG tablet Take 10 mg by mouth daily. 04/27/14  Yes Historical Provider, MD  furosemide (LASIX) 80 MG tablet Take 80 mg by mouth daily. 04/22/15  Yes Historical Provider, MD  isosorbide mononitrate (IMDUR) 60 MG 24 hr tablet Take 1 tablet (60 mg total) by mouth 3 (three) times daily. Please make an appointment for additional refills 03/21/15  Yes Mihai Croitoru, MD  lisinopril (PRINIVIL,ZESTRIL) 5 MG tablet Take 5 mg by mouth daily.    Yes Historical Provider, MD  metoprolol (LOPRESSOR) 100 MG tablet Take 100 mg by mouth 2 (two) times daily.  08/08/14  Yes Historical Provider, MD  modafinil (PROVIGIL) 100 MG tablet Take 100 mg by mouth 2 (two) times daily as needed (shortness of breath).    Yes Historical Provider, MD  nitroGLYCERIN (NITROLINGUAL) 0.4 MG/SPRAY spray Place 1 spray under the tongue every 5 (five) minutes x 3 doses as needed for chest pain.   Yes Historical Provider, MD  pantoprazole (PROTONIX) 40 MG tablet TAKE 1 TABLET BY MOUTH 30 MINUTES BEFORE BREAKFAST 01/16/16  Yes Carlis Stable, NP  potassium chloride SA (K-DUR,KLOR-CON) 20 MEQ tablet Take 1 tablet (20 mEq total) by mouth 2 (two) times daily. 07/22/14  Yes Mihai Croitoru, MD  ranolazine (RANEXA) 1000 MG SR tablet Take 1 tablet (1,000 mg total) by  mouth 2 (two) times daily. NEED APPOINTMENT FOR FUTURE REFILLS 07/22/14  Yes Mihai Croitoru, MD  rosuvastatin (CRESTOR) 20 MG tablet Take 1 tablet (20 mg total) by mouth daily. Needs appointment for further refills   Yes Mihai Croitoru, MD  sitaGLIPtin (JANUVIA) 100 MG tablet Take 100 mg by mouth daily.    Yes Historical Provider, MD  Umeclidinium-Vilanterol 62.5-25 MCG/INH AEPB Inhale 1 puff into the lungs daily as needed (wheezing). Anoro Ellipta   Yes Historical Provider, MD  Choline Fenofibrate (TRILIPIX) 135 MG capsule Take 135 mg by mouth daily.    Historical Provider, MD  HYDROcodone-acetaminophen (NORCO) 5-325 MG per tablet Take 1-2 tablets by mouth  every 4 (four) hours as needed for moderate pain. 06/03/15   Cherylann Ratel, PA-C    Allergies:   Allergies  Allergen Reactions  . Contrast Media [Iodinated Diagnostic Agents] Other (See Comments)    Pt must be premedicated before given contrast media - stops heart  . Iohexol Other (See Comments)     Consult with radiologist before pre meds are given.Desc: PT. STATES HEART STOPPED HAS TO BE PREMED.     Social History:  reports that he has been smoking Cigarettes.  He has a 15 pack-year smoking history. He does not have any smokeless tobacco history on file. He reports that he drinks alcohol. He reports that he does not use illicit drugs.  Family History: Family History  Problem Relation Age of Onset  . Colon cancer Neg Hx   . Anesthesia problems Neg Hx   . Hypotension Neg Hx   . Malignant hyperthermia Neg Hx   . Pseudochol deficiency Neg Hx   . Diabetes Mother   . Heart disease Mother   . Diabetes Father   . Heart disease Father     Physical Exam: Filed Vitals:   02/13/16 0230 02/13/16 0245 02/13/16 0300 02/13/16 0336  BP: 1'06/72 94/34 93/82 '$ 100/63  Pulse: 89 92 90 93  Temp:      TempSrc:      Resp: '21 22 10 22  '$ Height:      Weight:      SpO2: 99% 99% 96% 93%   General appearance: alert, cooperative and no  distress Head: Normocephalic, without obvious abnormality, atraumatic Eyes: negative Nose: Nares normal. Septum midline. Mucosa normal. No drainage or sinus tenderness. Neck: no JVD and supple, symmetrical, trachea midline Lungs: clear to auscultation bilaterally Heart: regular rate and rhythm, S1, S2 normal, no murmur, click, rub or gallop Abdomen: soft, non-tender; bowel sounds normal; no masses,  no organomegaly Extremities: extremities normal, atraumatic, no cyanosis or edema Pulses: 2+ and symmetric Skin: Skin color, texture, turgor normal. No rashes or lesions Neurologic: Grossly normal    Labs on Admission:   Recent Labs  02/13/16 0208  NA 134*  K 3.1*  CL 98*  CO2 22  GLUCOSE 169*  BUN 12  CREATININE 1.13  CALCIUM 8.7*    Recent Labs  02/13/16 0208  WBC 13.3*  HGB 16.4  HCT 46.6  MCV 96.5  PLT 277    Recent Labs  02/13/16 0208  TROPONINI <0.03    Radiological Exams on Admission: Dg Chest Port 1 View  02/13/2016  CLINICAL DATA:  Acute onset central and LEFT chest pain while watching TV. EXAM: PORTABLE CHEST 1 VIEW COMPARISON:  CT chest November 14, 2015 FINDINGS: Cardiac silhouette is mildly enlarged. Mediastinal silhouette is not widened. Multiple fractured median sternotomy wires. Pulmonary vascular congestion. Small RIGHT pleural effusion. No focal consolidation. No pneumothorax. Large body habitus. Osseous structures are nonsuspicious. IMPRESSION: Mild cardiomegaly and pulmonary vascular congestion. Small RIGHT pleural effusion. Electronically Signed   By: Elon Alas M.D.   On: 02/13/2016 03:51   ekg reviewed nsr no acute issues cxr mild edema   Old chart reviewed  Assessment/Plan  55 yo male with anginal type symptoms with complicated cardiac history  Principal Problem:   Chest pain/ [place]-  Cp free now.  Serial troponin.  On eloquis. Aspirin.  Ranexa.  Will consult cardiology in am, but probably will be optimal medical management.  Pt  strongly encouraged to quit smoking.  Active Problems:   Obstructive sleep apnea  PAROXYSMAL ATRIAL FIBRILLATION   Diabetes mellitus, type 2 (HCC)   Obesity, unspecified  Clarify home meds.  Nurse is in the process of doing this now.  obs on tele.  Full code.  Jeanne Terrance A 02/13/2016, 4:07 AM

## 2016-02-13 NOTE — ED Provider Notes (Addendum)
TIME SEEN: 1:50 AM  CHIEF COMPLAINT: Chest pain, shortness of breath  HPI: Pt is a 55 y.o. male with history of morbid obesity, coronary artery disease status post CABG in 2008, paroxysmal atrial fibrillation on Eliquis, hypertension, hyperlipidemia, diabetes, sleep apnea on see Pap at night, COPD who presents to the emergency department with left-sided chest pressure that radiates up into his neck and down his left arm to the elbow that started around 12 AM while at rest watching television. Has had associated shortness of breath, diaphoresis. No nausea, vomiting. Took 2 nitroglycerin sprays at home which relieved his pain to a 5/10. Has had some wheezing. Also has a dry cough. No fever. No lower extremity swelling or pain. No history of PE or DVT. His cardiologist is with no vomiting [place]. Primary care physician is Dr. Hilma Favors. Patient is not sure when his last cardiac catheterization was ordered his last stress test. Does report his symptoms are similar to his previous heart attacks.  ROS: See HPI Constitutional: no fever  Eyes: no drainage  ENT: no runny nose   Cardiovascular:   chest pain  Resp:  SOB  GI: no vomiting GU: no dysuria Integumentary: no rash  Allergy: no hives  Musculoskeletal: no leg swelling  Neurological: no slurred speech ROS otherwise negative  PAST MEDICAL HISTORY/PAST SURGICAL HISTORY:  Past Medical History  Diagnosis Date  . CAD (coronary artery disease) 2008    bypass grafting   . Morbid obesity ([place])   . PAF (paroxysmal atrial fibrillation) ([place])   . HTN (hypertension)   . COPD (chronic obstructive pulmonary disease) ([place])   . Hyperlipidemia   . S/P colonoscopy 2009    3-4 mm transverse colon erosions likely secondary to  ASA  . S/P endoscopy Dec 2011    moderate erosive gastritis, Barrett's esophagus 1-2cm  . Myocardial infarction ([place]) 2008,2009,2009  . Chronic kidney disease     hx of kidney stones  . GERD (gastroesophageal reflux disease)    . Barrett's esophagus   . Depression   . Glaucoma   . SOB (shortness of breath) 11/03/2007    2D Echo EF 50%-55%  . Abnormal myocardial perfusion study 01/01/2011    there a small to moderate sized inferobasal scar  . Diabetes ([place])     type 2 diabetes mellitus  . Claudication ([place]) 11/16/2011    PV test perform shows normal  . Cataracts, bilateral   . Dysrhythmia   . Hernia of abdominal wall   . Sleep apnea   . OSA (obstructive sleep apnea)     on cpap  . Stroke [facility])     MEDICATIONS:  Prior to Admission medications   Medication Sig Start Date End Date Taking? Authorizing Provider  apixaban (ELIQUIS) 5 MG TABS tablet Take 1 tablet (5 mg total) by mouth 2 (two) times daily. 06/04/15  Yes Mike Craze Petrarca, PA-C  diazepam (VALIUM) 5 MG tablet Take 5 mg by mouth 2 (two) times daily.   Yes Historical Provider, MD  escitalopram (LEXAPRO) 10 MG tablet Take 10 mg by mouth daily. 04/27/14  Yes Historical Provider, MD  furosemide (LASIX) 80 MG tablet Take 80 mg by mouth daily. 04/22/15  Yes Historical Provider, MD  isosorbide mononitrate (IMDUR) 60 MG 24 hr tablet Take 1 tablet (60 mg total) by mouth 3 (three) times daily. Please make an appointment for additional refills 03/21/15  Yes Mihai Croitoru, MD  lisinopril (PRINIVIL,ZESTRIL) 5 MG tablet Take 5 mg by mouth daily.  Yes Historical Provider, MD  metoprolol (LOPRESSOR) 100 MG tablet Take 100 mg by mouth 2 (two) times daily.  08/08/14  Yes Historical Provider, MD  modafinil (PROVIGIL) 100 MG tablet Take 100 mg by mouth 2 (two) times daily as needed (shortness of breath).    Yes Historical Provider, MD  nitroGLYCERIN (NITROLINGUAL) 0.4 MG/SPRAY spray Place 1 spray under the tongue every 5 (five) minutes x 3 doses as needed for chest pain.   Yes Historical Provider, MD  pantoprazole (PROTONIX) 40 MG tablet TAKE 1 TABLET BY MOUTH 30 MINUTES BEFORE BREAKFAST 01/16/16  Yes Carlis Stable, NP  potassium chloride SA (K-DUR,KLOR-CON) 20 MEQ tablet Take  1 tablet (20 mEq total) by mouth 2 (two) times daily. 07/22/14  Yes Mihai Croitoru, MD  ranolazine (RANEXA) 1000 MG SR tablet Take 1 tablet (1,000 mg total) by mouth 2 (two) times daily. NEED APPOINTMENT FOR FUTURE REFILLS 07/22/14  Yes Mihai Croitoru, MD  rosuvastatin (CRESTOR) 20 MG tablet Take 1 tablet (20 mg total) by mouth daily. Needs appointment for further refills   Yes Mihai Croitoru, MD  sitaGLIPtin (JANUVIA) 100 MG tablet Take 100 mg by mouth daily.    Yes Historical Provider, MD  Umeclidinium-Vilanterol 62.5-25 MCG/INH AEPB Inhale 1 puff into the lungs daily as needed (wheezing). Anoro Ellipta   Yes Historical Provider, MD  Choline Fenofibrate (TRILIPIX) 135 MG capsule Take 135 mg by mouth daily.    Historical Provider, MD  HYDROcodone-acetaminophen (NORCO) 5-325 MG per tablet Take 1-2 tablets by mouth every 4 (four) hours as needed for moderate pain. 06/03/15   Cherylann Ratel, PA-C    ALLERGIES:  Allergies  Allergen Reactions  . Contrast Media [Iodinated Diagnostic Agents] Other (See Comments)    Pt must be premedicated before given contrast media - stops heart  . Iohexol Other (See Comments)     Consult with radiologist before pre meds are given.Desc: PT. STATES HEART STOPPED HAS TO BE PREMED.     SOCIAL HISTORY:  Social History  Substance Use Topics  . Smoking status: Current Every Day Smoker -- 0.50 packs/day for 30 years    Types: Cigarettes  . Smokeless tobacco: Not on file  . Alcohol Use: Yes     Comment: every other day, one drink of beer or liquor    FAMILY HISTORY: Family History  Problem Relation Age of Onset  . Colon cancer Neg Hx   . Anesthesia problems Neg Hx   . Hypotension Neg Hx   . Malignant hyperthermia Neg Hx   . Pseudochol deficiency Neg Hx   . Diabetes Mother   . Heart disease Mother   . Diabetes Father   . Heart disease Father     EXAM: BP 91/71 mmHg  Pulse 98  Temp(Src) 98.7 F (37.1 C) (Oral)  Resp 18  Ht '5\' 9"'$  (1.753 m)  Wt 306 lb  (138.801 kg)  BMI 45.17 kg/m2  SpO2 100% CONSTITUTIONAL: Alert and oriented and responds appropriately to questions. Morbidly obese, chronically ill-appearing, in no significant distress HEAD: Normocephalic EYES: Conjunctivae clear, PERRL ENT: normal nose; no rhinorrhea; moist mucous membranes NECK: Supple, no meningismus, no LAD  CARD: RRR; S1 and S2 appreciated; no murmurs, no clicks, no rubs, no gallops RESP: No tachypnea or hypoxia, patient has diminished breath sounds diffusely through wheezes, no rhonchi or rales, speaking full sentences, no respiratory distress ABD/GI: Normal bowel sounds; non-distended; soft, non-tender, no rebound, no guarding, no peritoneal signs BACK:  The back appears normal and is  non-tender to palpation, there is no CVA tenderness EXT: Normal ROM in all joints; non-tender to palpation; no edema; normal capillary refill; no cyanosis, no calf tenderness or swelling    SKIN: Normal color for age and race; warm; no rash NEURO: Moves all extremities equally, sensation to light touch intact diffusely, cranial nerves II through XII intact PSYCH: The patient's mood and manner are appropriate. Grooming and personal hygiene are appropriate.  MEDICAL DECISION MAKING: Patient here with chest pain and shortness of breath. Has significant cardiac history as well as COPD. Differential diagnosis includes ACS, COPD exacerbation, CHF exacerbation, pneumonia, pneumothorax. Will check labs, obtain chest x-ray. EKG shows no new ischemic abnormality. We'll give fentanyl for his chest pain - blood pressure is slightly low so we'll hold off on nitroglycerin or morphine. We'll give a dose of aspirin. We'll give breathing treatments as well as IV Solu-Medrol. I feel he will need admission given his significant cardiac history. He agrees with this plan.  ED PROGRESS: 3:15 AM  Pt reports chest pain is gone after fentanyl. First set of cardiac enzymes negative. BNP normal. Chest x-ray shows no  obvious infiltrate, he has bibasilar atelectasis. Lungs more clear after breathing treatment. He still is mildly tachypneic. No hypoxia. Will discuss with hospitalist for admission for chest pain rule out.   3:20 AM  Discussed patient's case with hospitalist, Dr. Shanon Brow.  Recommend admission to telemetry, observation bed.  I will place holding orders per their request. Patient and family (if present) updated with plan. Care transferred to hospitalist service.   I reviewed all nursing notes, vitals, pertinent old records, EKGs, labs, imaging (as available).   EKG Interpretation  Date/Time:  Friday February 13 2016 01:44:18 EDT Ventricular Rate:  99 PR Interval:  137 QRS Duration: 93 QT Interval:  365 QTC Calculation: 468 R Axis:   50 Text Interpretation:  Sinus rhythm Abnormal R-wave progression, early transition Borderline repolarization abnormality No significant change since last tracing June 2016 Confirmed by [place],  DO, Sabrin Dunlevy [id number]) on 02/13/2016 1:48:25 AM         Delice Bison Aragorn Recker, DO 02/13/16 [place], DO 02/13/16 0334   3:40 AM  Nursing Staff reported patient complaining of sharp intermittent left-sided chest pains now. Only last for several seconds and then gone. Repeat EKG is unchanged. Will give another dose of fentanyl.    EKG Interpretation  Date/Time:  Friday February 13 2016 03:38:01 EDT Ventricular Rate:  92 PR Interval:  140 QRS Duration: 102 QT Interval:  413 QTC Calculation: 511 R Axis:   27 Text Interpretation:  Sinus rhythm Consider left atrial enlargement Abnormal R-wave progression, early transition Consider inferior infarct Abnormal T, consider ischemia, lateral leads Prolonged QT interval No significant change since last tracing Confirmed by Nekeshia Lenhardt,  DO, Jmarion Christiano [id number]) on 02/13/2016 3:40:14 AM        [place], DO 02/13/16 4103

## 2016-02-13 NOTE — Care Management Obs Status (Signed)
[facility] NOTIFICATION   Patient Details  Name: [name] MRN: [id number] Date of Birth: 03/25/1961   Medicare Observation Status Notification Given:  Yes    Alvie Heidelberg, RN 02/13/2016, 12:48 PM

## 2016-02-13 NOTE — ED Notes (Addendum)
EKG done and given to Dr Leonides Schanz. Patient cardiac monitor.

## 2016-02-13 NOTE — Progress Notes (Signed)
Discharge instructions given, verbalized understanding, out via w/c with staff, condition stable.

## 2016-02-13 NOTE — Consult Note (Signed)
CARDIOLOGY CONSULT NOTE   Patient ID: GRAYSEN WOODYARD MRN: [id number] DOB/AGE: 53-Nov-1962 53 y.o.  Admit Date: 02/13/2016 Referring Physician: TR[facility]-Goodrich MD Primary Physician: Purvis Kilts, MD Consulting Cardiologist: Carlyle Dolly MD Primary Cardiologist-[facility] MD Reason for Consultation: Chest Pain with known CAD  Clinical Summary Mr. Kliebert is a 55 y.o.male with known history CAD s/p CABG, PAF, HTN, COPD. He had been admitted to [facility] in 03/2014 with evaluation by cardiology with reportedly he has no good targets for revascularization. On impressive antianginal regimen of ranolazine, imdur and beta blocker. will increase imdur to 60 tid . Had previously had discussion at [facility] regarding possible heart transplant with his class III to IV symptoms but now refuses this. He is on Ranexa for chronic angina but states that he is to stop it as he was transitioned to Imdur. Last seen by Dr. Hamilton Capri 11/20/2015.   He was seen in the ER with complaints of recurrent SSCP radiating to his jaw and left arm. He took NTG at home with no improvement. On arrival to ER BP 91/71, HR 100, afebrile. Potassium 3.1, with NA 134, Glucose 169. WBC elevated at 13.3. CXR mild cardiomegaly and pulmonary vascular congestion. Small right pleural effusion. Troponin negative X 2. EKG NSR rate of 82 bpm, with prolonged QT interval and non-specific T wave abnormalities. He was treated with albuterol inhalers,solumederol, ASA, potassium, and fentanyl.   He unfortunately continues to smoke heavily. He is is very sedentary,and continues to eat foods that are high caloric. He states he is feeling some better. He admits to have frequent diarrhea.   Allergies  Allergen Reactions  . Contrast Media [Iodinated Diagnostic Agents] Other (See Comments)    Pt must be premedicated before given contrast media - stops heart  . Iohexol Other (See Comments)     Consult with radiologist before pre meds are  given.Desc: PT. STATES HEART STOPPED HAS TO BE PREMED.     Medications Scheduled Medications: . apixaban  5 mg Oral BID  . aspirin EC  325 mg Oral Daily  . furosemide  80 mg Oral Daily  . metoprolol  25 mg Oral BID  . ranolazine  1,000 mg Oral BID  . rosuvastatin  20 mg Oral Daily      PRN Medications: acetaminophen, gi cocktail, ondansetron (ZOFRAN) IV   Past Medical History  Diagnosis Date  . CAD (coronary artery disease) 2008    bypass grafting   . Morbid obesity ([place])   . PAF (paroxysmal atrial fibrillation) ([place])   . HTN (hypertension)   . COPD (chronic obstructive pulmonary disease) ([place])   . Hyperlipidemia   . S/P colonoscopy 2009    3-4 mm transverse colon erosions likely secondary to  ASA  . S/P endoscopy Dec 2011    moderate erosive gastritis, Barrett's esophagus 1-2cm  . Myocardial infarction ([place]) 2008,2009,2009  . Chronic kidney disease     hx of kidney stones  . GERD (gastroesophageal reflux disease)   . Barrett's esophagus   . Depression   . Glaucoma   . SOB (shortness of breath) 11/03/2007    2D Echo EF 50%-55%  . Abnormal myocardial perfusion study 01/01/2011    there a small to moderate sized inferobasal scar  . Diabetes ([place])     type 2 diabetes mellitus  . Claudication ([place]) 11/16/2011    PV test perform shows normal  . Cataracts, bilateral   . Dysrhythmia   . Hernia of abdominal wall   . Sleep  apnea   . OSA (obstructive sleep apnea)     on cpap  . Stroke [facility])     Past Surgical History  Procedure Laterality Date  . Cabg x 4  03/2008  . Hernia repair      ventral hernia repair  . Coronary artery bypass graft  2008    4 vessels  . Cardiac catheterization  2009    stent placement to the left circumflex a 2.25   . Savory dilation  09/06/2011    Procedure: SAVORY DILATION;  Surgeon: Dorothyann Peng, MD;  Location: [facility];  Service: Endoscopy;  Laterality: N/A;  Dilated with 43m  . Esophagogastroduodenoscopy  09/2011    Barrett's  esophagus, no dysplasia on biopsies. Distal esophagitis. Status post dilation. Moderate gastritis and duodenitis, but biopsies benign. Next EGD in November 2015 for surveillance of Barrett's esophagus.  . Cardiac catheterization  07/08/2010  . Coronary stent placement  12/29/12  . Coronary stent placement  12/2012  . Left heart catheterization with coronary/graft angiogram N/A 12/29/2012    Procedure: LEFT HEART CATHETERIZATION WITH CBeatrix Fetters  Surgeon: TTroy Sine MD;  [facility];  Service: Cardiovascular;  Laterality: N/A;  . Percutaneous coronary stent intervention (pci-s)  12/29/2012    Procedure: PERCUTANEOUS CORONARY STENT INTERVENTION (PCI-S);  Surgeon: TTroy Sine MD;  [facility];  Service: Cardiovascular;;  . Esophagogastroduodenoscopy (egd) with propofol N/A 04/29/2015    SLF: 1. Barretts esophagus 2. Moderate non-erosive gastritis.   . Esophageal dilation N/A 04/29/2015    Procedure: ESOPHAGEAL DILATION 15 mm, 16 mm;  Surgeon: SDanie Binder MD;  Location: [facility];  Service: Endoscopy;  Laterality: N/A;  . Biopsy N/A 04/29/2015    Procedure: BIOPSY;  Surgeon: SDanie Binder MD;  Location: [facility];  Service: Endoscopy;  Laterality: N/A;  . Orif fibula fracture Right 06/03/2015    Procedure: OPEN REDUCTION INTERNAL FIXATION (ORIF) DISTAL FIBULA  FRACTURE;  Surgeon: PGarald Balding MD;  [facility]  Service: Orthopedics;  Laterality: Right;    Family History  Problem Relation Age of Onset  . Colon cancer Neg Hx   . Anesthesia problems Neg Hx   . Hypotension Neg Hx   . Malignant hyperthermia Neg Hx   . Pseudochol deficiency Neg Hx   . Diabetes Mother   . Heart disease Mother   . Diabetes Father   . Heart disease Father     Social History Mr. FMiguesreports that he has been smoking Cigarettes.  He has a 15 pack-year smoking history. He does not have any smokeless tobacco history on file. Mr. FLukareports that he drinks  alcohol.  Review of Systems Complete review of systems are found to be negative unless outlined in H&P above.  Physical Examination Blood pressure 115/80, pulse 101, temperature 98.7 F (37.1 C), temperature source Oral, resp. rate 20, height '5\' 9"'$  (1.753 m), weight 280 lb 11.2 oz (127.325 kg), SpO2 95 %. No intake or output data in the 24 hours ending 02/13/16 0853  Telemetry: NSR sinus tachycardia   GEN: Morbidly obese, no acute distress HEENT: Conjunctiva and lids normal, oropharynx clear with moist mucosa. Neck: Supple, no elevated JVP or carotid bruits, no thyromegaly. Lungs: Bilateral wheezes, both inspiratory and expiratory.  Cardiac: Regular rate and rhythm, distant heart sounds,  no S3 or significant systolic murmur, no pericardial rub. Abdomen: Soft, nontender, no hepatomegaly, bowel sounds present, no guarding or rebound. Extremities: No pitting edema, distal pulses 2+.  Skin: Warm and dry. Musculoskeletal: No kyphosis. Neuropsychiatric: Alert and oriented x3, affect grossly appropriate.  Prior Cardiac Testing/Procedures 1. Cardiac Cath 12/29/2012 AO: 96/63 LV: 96/12   LM: 30 - 40% mid LAD: 50 prox between dx1 and 2 with "flush and Fill" due to LIMA filling RI: occluded near ostially LCX: progressive 95% ostial, patent stent in OM RCA: occluded prox-mid  SVG to RCA: occluded SVG to RI: patent SVG to distal LCX: patent, but does not fill prox LCX territory or OM1 LIMA to LAD: patent  LV: EF 50 - 55% with inferior, inferolateral hypo-akinesis and mild upper posterolateral hypokinesis.  Complex but successful PCI with 2.0x6, 2.5x6 Angiosculpt, 2.25 x 12 DES Xience stent post-dilated with 2.5 x 12 [place] Angiomax/Efient/NTG  Lab Results  Basic Metabolic Panel:  Recent Labs Lab 02/13/16 0208  NA 134*  K 3.1*  CL 98*  CO2 22  GLUCOSE 169*  BUN 12  CREATININE 1.13  CALCIUM 8.7*    Liver Function Tests: No results for input(s): AST, ALT, ALKPHOS,  BILITOT, PROT, ALBUMIN in the last 168 hours.  CBC:  Recent Labs Lab 02/13/16 0208  WBC 13.3*  HGB 16.4  HCT 46.6  MCV 96.5  PLT 277    Cardiac Enzymes:  Recent Labs Lab 02/13/16 0208 02/13/16 0434  TROPONINI <0.03 <0.03    BNP: Invalid input(s): POCBNP   Radiology: Dg Chest Port 1 View  02/13/2016  CLINICAL DATA:  Acute onset central and LEFT chest pain while watching TV. EXAM: PORTABLE CHEST 1 VIEW COMPARISON:  CT chest November 14, 2015 FINDINGS: Cardiac silhouette is mildly enlarged. Mediastinal silhouette is not widened. Multiple fractured median sternotomy wires. Pulmonary vascular congestion. Small RIGHT pleural effusion. No focal consolidation. No pneumothorax. Large body habitus. Osseous structures are nonsuspicious. IMPRESSION: Mild cardiomegaly and pulmonary vascular congestion. Small RIGHT pleural effusion. Electronically Signed   By: Elon Alas M.D.   On: 02/13/2016 03:51     ECG: NSR with prolonged QT interval, non-specific T-wave changes in the lateral leads    Impression and Recommendations  1. Chronic Angina : Chronic for him. Experiences this once a week or more. Usually relieved by NTG spray. Also on daily isosorbide. Pain is consistent with his usual angina pain but he states it was worse, causing him to have diaphoresis. He was found to be hypokalemic. He is on lasix at home, and states he is taking the potassium supplements. He has been having diarrhea. This may be why he is hypokalemic, and contributing to chest pain. With replacement he is feeling better.   2. CAD: Hx of CABG, Stents to left Cx. He continues on BB, DAPT, statin, nitrates. He refuses heart transplant. Consider changing BB to CCB due to active wheezing and possible Prinzmetal Angina symptoms.  He has normal LV fx.   3. COPD with ongoing tobacco abuse; I have counseled him on smoking cessation. He does not appear to be interested in quitting at this time.      Signed: Phill Myron. Lawrence NP [place]  02/13/2016, 8:53 AM Co-Sign MD  Patient seen and discussed with NP Purcell Nails, I agree with her documentation above. 55 yo male history of CAD s/p CABG and prior PCI with chronic angina secondary to CTO of RCA and occluded SVG-RCA bypass with no revasc options, HTN, COPD, HL, PAF, DM2 currently followed by Dr Hamilton Capri at [facility] admitted with chest pain.    BNP 20, trop neg x 2, K 3.1 Cr 1.13 CXR mild congestion  EKG SR chronic anterior ST/T changes 04/2014 echo: LVEF 55-60%, grade II diastolic dysfunction  Patient with known CAD and chronic history of angina without good revasc options, currently followed by [facility]. He has been managed with medical therapy for his chronic angina including lopressor, ranexa, imdur. From notes has had troubles affording ranexa but remains on. No evidence of ACS by EKG or enzymes. We will decrease lisionpril to 2.5 mg daily to allow room to start norvasc 2.'5mg'$  daily as additional antianginal. Resume home cardiac meds, appears some were put on hold during admit. He will f/u with cardiology at [facility]. No further cardiac testing planned at this time   Zandra Abts MD

## 2016-02-13 NOTE — Discharge Summary (Signed)
Physician Discharge Summary  [name] ZOX:[id number] DOB: October 23, 1961 DOA: 02/13/2016  PCP: Purvis Kilts, MD  Admit date: 02/13/2016 Discharge date: 02/13/2016  Recommendations for Outpatient Follow-up:  1. Follow up chronic angina  Follow-up Information    Follow up with CLEVENGER, JEFFREY C, MD. Schedule an appointment as soon as possible for a visit in 2 weeks.   Specialty:  Internal Medicine   Contact information:   [place] [address] [phone]      Discharge Diagnoses:  1. Chest pain/USA. 2. PAF 3. DM type 2 4. Morbid obesity 5. Tobacco use disorder  Discharge Condition: improved Disposition: discharge home  Diet recommendation: heart healthy, carb modified  Filed Weights   02/13/16 0202 02/13/16 0432  Weight: 138.801 kg (306 lb) 127.325 kg (280 lb 11.2 oz)    History of present illness:  75 yom PMH CABG 2008, CAD with stents of venous grafts, with chronic angina on Ranexa presented with reports of several hours of his typical anginal symptoms that were not relieved by NTG spray. Pt normally experienced angina about once a week and NTG spray usually helps. Presented with complaints of SSCP that radiated to his jaw and left arm that lasted over 30 minutes. He was referred for further management.   Hospital Course:  Patient was admitted for further evaluation of angina. Pain resolved, respiratory status stable. ACS was ruled out, seen in consultation with cardiology. Recommendations to add Norvasc and decrease lisinopril. Continue other cardiac medications. Recommend outpatient follow-up with cardiologist.  1. Chest pain /USA. Currently CP free. Troponins negative thus far. CXR showed mild cardiomegaly and pulmonary vascular congestion. Cardiology recommends continuing Lopressor, Imdur; Norvasc added and lisinopril decreased 2.5 mg daily 2. Hypokalemia. Replaced. 3. Obstructive Sleep apnea (OSA). Uses BiPAP at home. 4. PAF.  Currently sinus tachycardia. Continue beta blocker, Eliquis.  5. DM type 2. Stable 6. COPD appears stable. 7. Morbid obesity 8. Tobacco use disorder. He is not that interested in cessation.  Consultations:  Cardiology    Discharge Instructions  Discharge Instructions    Activity as tolerated - No restrictions    Complete by:  As directed      Diet - low sodium heart healthy    Complete by:  As directed      Diet Carb Modified    Complete by:  As directed      Discharge instructions    Complete by:  As directed   Call your physician or seek immediate medical attention for chest pain, shortness of breath or worsening of condition.          Current Discharge Medication List    START taking these medications   Details  amLODipine (NORVASC) 2.5 MG tablet Take 1 tablet (2.5 mg total) by mouth daily. Qty: 30 tablet, Refills: 0      CONTINUE these medications which have CHANGED   Details  lisinopril (PRINIVIL,ZESTRIL) 5 MG tablet Take 0.5 tablets (2.5 mg total) by mouth daily.    rosuvastatin (CRESTOR) 20 MG tablet Take 1 tablet (20 mg total) by mouth daily. Needs appointment for further refills Qty: 30 tablet, Refills: 0      CONTINUE these medications which have NOT CHANGED   Details  apixaban (ELIQUIS) 5 MG TABS tablet Take 1 tablet (5 mg total) by mouth 2 (two) times daily. Qty: 1 tablet, Refills: 0    diazepam (VALIUM) 5 MG tablet Take 5 mg by mouth 2 (two) times daily.    escitalopram (  LEXAPRO) 10 MG tablet Take 10 mg by mouth daily.    furosemide (LASIX) 80 MG tablet Take 80 mg by mouth daily. Refills: 4    HYDROcodone-acetaminophen (NORCO) 5-325 MG per tablet Take 1-2 tablets by mouth every 4 (four) hours as needed for moderate pain. Qty: 60 tablet, Refills: 0    isosorbide mononitrate (IMDUR) 60 MG 24 hr tablet Take 1 tablet (60 mg total) by mouth 3 (three) times daily. Please make an appointment for additional refills Qty: 45 tablet, Refills: 0      metoprolol (LOPRESSOR) 100 MG tablet Take 100 mg by mouth 2 (two) times daily.     modafinil (PROVIGIL) 100 MG tablet Take 100 mg by mouth 2 (two) times daily as needed (shortness of breath).     pantoprazole (PROTONIX) 40 MG tablet TAKE 1 TABLET BY MOUTH 30 MINUTES BEFORE BREAKFAST Qty: 30 tablet, Refills: 5    potassium chloride SA (K-DUR,KLOR-CON) 20 MEQ tablet Take 1 tablet (20 mEq total) by mouth 2 (two) times daily. Qty: 30 tablet, Refills: 0    ranolazine (RANEXA) 1000 MG SR tablet Take 1 tablet (1,000 mg total) by mouth 2 (two) times daily. NEED APPOINTMENT FOR FUTURE REFILLS Qty: 30 tablet, Refills: 0    sitaGLIPtin (JANUVIA) 100 MG tablet Take 100 mg by mouth daily.     Choline Fenofibrate (TRILIPIX) 135 MG capsule Take 135 mg by mouth daily.    nitroGLYCERIN (NITROLINGUAL) 0.4 MG/SPRAY spray Place 1 spray under the tongue every 5 (five) minutes x 3 doses as needed for chest pain.    Umeclidinium-Vilanterol 62.5-25 MCG/INH AEPB Inhale 1 puff into the lungs daily as needed (wheezing). Anoro Ellipta       Allergies  Allergen Reactions  . Contrast Media [Iodinated Diagnostic Agents] Other (See Comments)    Pt must be premedicated before given contrast media - stops heart  . Iohexol Other (See Comments)     Consult with radiologist before pre meds are given.Desc: PT. STATES HEART STOPPED HAS TO BE PREMED.     The results of significant diagnostics from this hospitalization (including imaging, microbiology, ancillary and laboratory) are listed below for reference.    Significant Diagnostic Studies: Dg Chest Port 1 View  02/13/2016  CLINICAL DATA:  Acute onset central and LEFT chest pain while watching TV. EXAM: PORTABLE CHEST 1 VIEW COMPARISON:  CT chest November 14, 2015 FINDINGS: Cardiac silhouette is mildly enlarged. Mediastinal silhouette is not widened. Multiple fractured median sternotomy wires. Pulmonary vascular congestion. Small RIGHT pleural effusion. No focal  consolidation. No pneumothorax. Large body habitus. Osseous structures are nonsuspicious. IMPRESSION: Mild cardiomegaly and pulmonary vascular congestion. Small RIGHT pleural effusion. Electronically Signed   By: Elon Alas M.D.   On: 02/13/2016 03:51   Labs: Basic Metabolic Panel:  Recent Labs Lab 02/13/16 0208  NA 134*  K 3.1*  CL 98*  CO2 22  GLUCOSE 169*  BUN 12  CREATININE 1.13  CALCIUM 8.7*   CBC:  Recent Labs Lab 02/13/16 0208  WBC 13.3*  HGB 16.4  HCT 46.6  MCV 96.5  PLT 277   Cardiac Enzymes:  Recent Labs Lab 02/13/16 0208 02/13/16 0434 02/13/16 1030 02/13/16 1658  TROPONINI <0.03 <0.03 <0.03 <0.03     Recent Labs  02/13/16 0208  BNP 20.0    Principal Problem:   Chest pain Active Problems:   Obstructive sleep apnea   PAROXYSMAL ATRIAL FIBRILLATION   Diabetes mellitus, type 2 (HCC)   Obesity, unspecified   Time coordinating  discharge: 35 minutes  Signed:  Murray Hodgkins, MD [facility] 02/13/2016, 6:19 PM  By signing my name below, I, Delene Ruffini, attest that this documentation has been prepared under the direction and in the presence of [place]. Sarajane Jews, MD. Electronically Signed: Delene Ruffini, Scribe.  02/13/2016 10:45 am  I personally performed the services described in this documentation. All medical record entries made by the scribe were at my direction. I have reviewed the chart and agree that the record reflects my personal performance and is accurate and complete. Murray Hodgkins, MD

## 2016-02-13 NOTE — ED Notes (Signed)
Pt states he was sitting watching tv when he had sudden onset of pain to his central and left chest with radiation to left arm, sob as well.

## 2016-02-13 NOTE — Care Management Note (Signed)
Case Management Note  Patient Details  Name: [name] MRN: [id number] Date of Birth: 23-Apr-1961  Subjective/Objective:    Spoke with patient who is alert and oriented from home. Stated that they can afford medications and are able to make thier medical appointments without difficulty. No DME or O2 at home. No CM needs identified.                Action/Plan: Home with self care.   Expected Discharge Date:                  Expected Discharge Plan:  Home/Self Care  In-House Referral:     Discharge planning Services  CM Consult  Post Acute Care Choice:    Choice offered to:     DME Arranged:    DME Agency:     HH Arranged:    [place] Agency:     Status of Service:  Completed, signed off  Medicare Important Message Given:    Date Medicare IM Given:    Medicare IM give by:    Date Additional Medicare IM Given:    Additional Medicare Important Message give by:     If discussed at [place] of Stay Meetings, dates discussed:    Additional Comments:  Alvie Heidelberg, RN 02/13/2016, 12:49 PM

## 2016-02-13 NOTE — Progress Notes (Signed)
PROGRESS NOTE  [name]:[id number] DOB: 25-Mar-1961 DOA: 02/13/2016 PCP: Purvis Kilts, MD Dr. Hamilton Capri   Summary: 72 yom [place] CABG 2008, CAD with stents of venous grafts, with chronic angina on Ranexa  presented with reports of several hours of his typical anginal symptoms that were not relieved by NTG spray. Pt normally experienced angina about once a week and NTG spray usually helps. Presented with complaints of SSCP that radiated to his jaw and left arm that lasted over 30 minutes. He was referred for further management.   Assessment/Plan: 1. Chest pain /USA. Currently CP free. Troponins negative thus far. CXR showed mild cardiomegaly and pulmonary vascular congestion. Cardiology recommends continuing Lopressor, Imdur; Norvasc added and lisinopril decreased 2.5 mg daily 2. Hypokalemia. Replaced. 3. Obstructive Sleep apnea (OSA). Uses BiPAP at home. 4. PAF. Currently sinus tachycardia. Continue beta blocker, Eliquis.  5. DM type 2. Stable 6. COPD appears stable. 7. Morbid obesity 8. Tobacco use disorder. He is not that interested in cessation.   Currently pain free, troponins negative thus far. He has chronic angina. Appreciate cardiology evaluation, agree with addition of Norvasc.  We'll follow-up last troponin, if remains stable anticipate discharge later today.   Code Status: Full DVT prophylaxis: Eliquis Family Communication: Discussed with patient Disposition Plan: Discharge once improved  Murray Hodgkins, MD  [facility] Direct contact:  --Via amion app OR  --[email or web address]; password [email or web address] and click  [id number] contact night coverage as above 02/13/2016, 7:21 AM    Consultants:  Cardiolog  Procedures:  none  Antibiotics:  none  HPI/Subjective: Feels tired today, denies any chest pain. Breathing is difficult, though no deviation from baseline. Denies nausea vomiting, pain in jaw or left arm.   Objective: Filed Vitals:   02/13/16 0336  02/13/16 0400 02/13/16 0432 02/13/16 0530  BP: 100/63 111/78 115/80   Pulse: 93 94 101 101  Temp:      TempSrc:      Resp: '22 15 20   '$ Height:   '5\' 9"'$  (1.753 m)   Weight:   127.325 kg (280 lb 11.2 oz)   SpO2: 93% 93% 95% 98%   No intake or output data in the 24 hours ending 02/13/16 0721   Filed Weights   02/13/16 0202 02/13/16 0432  Weight: 138.801 kg (306 lb) 127.325 kg (280 lb 11.2 oz)    Exam:    General:  Appears comfortable, calm. Cardiovascular:  tachycardic,regular rhythm, no LE edema Telemetry: Sinus rhythm, no arrhythmias  Respiratory:  inspiratory crackles bilaterally, mild increased effort, speaks in full sentences Psychiatric: grossly normal mood and affect, speech fluent and appropriate  New data reviewed:  Sodium 134, Potassium low at 3.1  BUN and Cr wnl  Blood sugars stable  BNP unremarkable   Troponins negative thus far  WBC 13.3  H/H unremarkable, platelets unremarkable  Scheduled Meds: . apixaban  5 mg Oral BID  . aspirin EC  325 mg Oral Daily  . furosemide  80 mg Oral Daily  . ranolazine  1,000 mg Oral BID   Continuous Infusions:   Principal Problem:   Chest pain Active Problems:   Obstructive sleep apnea   PAROXYSMAL ATRIAL FIBRILLATION   Diabetes mellitus, type 2 (HCC)   Obesity, unspecified    By signing my name below, I, Delene Ruffini, attest that this documentation has been prepared under the direction and in the presence of [place]. Sarajane Jews, MD. Electronically Signed: Delene Ruffini, Scribe.  02/13/2016 10:45am  I personally performed the services  described in this documentation. All medical record entries made by the scribe were at my direction. I have reviewed the chart and agree that the record reflects my personal performance and is accurate and complete. Murray Hodgkins, MD

## 2016-02-18 DIAGNOSIS — Z1389 Encounter for screening for other disorder: Secondary | ICD-10-CM | POA: Diagnosis not present

## 2016-02-18 DIAGNOSIS — I25700 Atherosclerosis of coronary artery bypass graft(s), unspecified, with unstable angina pectoris: Secondary | ICD-10-CM | POA: Diagnosis not present

## 2016-02-18 DIAGNOSIS — Z6841 Body Mass Index (BMI) 40.0 and over, adult: Secondary | ICD-10-CM | POA: Diagnosis not present

## 2016-02-18 DIAGNOSIS — E876 Hypokalemia: Secondary | ICD-10-CM | POA: Diagnosis not present

## 2016-02-26 ENCOUNTER — Ambulatory Visit (INDEPENDENT_AMBULATORY_CARE_PROVIDER_SITE_OTHER): Payer: Medicare HMO | Admitting: Otolaryngology

## 2016-02-26 DIAGNOSIS — R1312 Dysphagia, oropharyngeal phase: Secondary | ICD-10-CM | POA: Diagnosis not present

## 2016-02-26 DIAGNOSIS — Z951 Presence of aortocoronary bypass graft: Secondary | ICD-10-CM | POA: Diagnosis not present

## 2016-02-26 DIAGNOSIS — K219 Gastro-esophageal reflux disease without esophagitis: Secondary | ICD-10-CM

## 2016-02-26 DIAGNOSIS — R0602 Shortness of breath: Secondary | ICD-10-CM | POA: Diagnosis not present

## 2016-04-22 DIAGNOSIS — E782 Mixed hyperlipidemia: Secondary | ICD-10-CM | POA: Diagnosis not present

## 2016-04-22 DIAGNOSIS — Z6841 Body Mass Index (BMI) 40.0 and over, adult: Secondary | ICD-10-CM | POA: Diagnosis not present

## 2016-04-22 DIAGNOSIS — J449 Chronic obstructive pulmonary disease, unspecified: Secondary | ICD-10-CM | POA: Diagnosis not present

## 2016-04-22 DIAGNOSIS — I10 Essential (primary) hypertension: Secondary | ICD-10-CM | POA: Diagnosis not present

## 2016-04-22 DIAGNOSIS — I2510 Atherosclerotic heart disease of native coronary artery without angina pectoris: Secondary | ICD-10-CM | POA: Diagnosis not present

## 2016-04-22 DIAGNOSIS — E118 Type 2 diabetes mellitus with unspecified complications: Secondary | ICD-10-CM | POA: Diagnosis not present

## 2016-04-27 DIAGNOSIS — G4710 Hypersomnia, unspecified: Secondary | ICD-10-CM | POA: Diagnosis not present

## 2016-04-27 DIAGNOSIS — G4733 Obstructive sleep apnea (adult) (pediatric): Secondary | ICD-10-CM | POA: Diagnosis not present

## 2016-04-27 DIAGNOSIS — J449 Chronic obstructive pulmonary disease, unspecified: Secondary | ICD-10-CM | POA: Diagnosis not present

## 2016-04-27 DIAGNOSIS — I6789 Other cerebrovascular disease: Secondary | ICD-10-CM | POA: Diagnosis not present

## 2016-04-27 DIAGNOSIS — R5381 Other malaise: Secondary | ICD-10-CM | POA: Diagnosis not present

## 2016-04-27 DIAGNOSIS — R413 Other amnesia: Secondary | ICD-10-CM | POA: Diagnosis not present

## 2016-04-27 DIAGNOSIS — I2510 Atherosclerotic heart disease of native coronary artery without angina pectoris: Secondary | ICD-10-CM | POA: Diagnosis not present

## 2016-05-18 ENCOUNTER — Other Ambulatory Visit (HOSPITAL_COMMUNITY): Payer: Self-pay | Admitting: Respiratory Therapy

## 2016-05-18 DIAGNOSIS — G4733 Obstructive sleep apnea (adult) (pediatric): Secondary | ICD-10-CM

## 2016-05-18 DIAGNOSIS — I2510 Atherosclerotic heart disease of native coronary artery without angina pectoris: Secondary | ICD-10-CM

## 2016-05-18 DIAGNOSIS — I6789 Other cerebrovascular disease: Secondary | ICD-10-CM

## 2016-05-18 DIAGNOSIS — G4710 Hypersomnia, unspecified: Secondary | ICD-10-CM

## 2016-05-18 DIAGNOSIS — J449 Chronic obstructive pulmonary disease, unspecified: Secondary | ICD-10-CM

## 2016-05-24 DIAGNOSIS — H40003 Preglaucoma, unspecified, bilateral: Secondary | ICD-10-CM | POA: Diagnosis not present

## 2016-05-31 DIAGNOSIS — H40003 Preglaucoma, unspecified, bilateral: Secondary | ICD-10-CM | POA: Diagnosis not present

## 2016-06-04 ENCOUNTER — Ambulatory Visit (INDEPENDENT_AMBULATORY_CARE_PROVIDER_SITE_OTHER): Payer: Medicare HMO | Admitting: Cardiovascular Disease

## 2016-06-04 ENCOUNTER — Encounter: Payer: Self-pay | Admitting: Cardiovascular Disease

## 2016-06-04 ENCOUNTER — Encounter (INDEPENDENT_AMBULATORY_CARE_PROVIDER_SITE_OTHER): Payer: Self-pay

## 2016-06-04 VITALS — BP 144/84 | HR 91 | Ht 70.0 in | Wt 297.0 lb

## 2016-06-04 DIAGNOSIS — Z720 Tobacco use: Secondary | ICD-10-CM

## 2016-06-04 DIAGNOSIS — I480 Paroxysmal atrial fibrillation: Secondary | ICD-10-CM

## 2016-06-04 DIAGNOSIS — E785 Hyperlipidemia, unspecified: Secondary | ICD-10-CM

## 2016-06-04 DIAGNOSIS — J438 Other emphysema: Secondary | ICD-10-CM

## 2016-06-04 DIAGNOSIS — I10 Essential (primary) hypertension: Secondary | ICD-10-CM | POA: Diagnosis not present

## 2016-06-04 DIAGNOSIS — I25708 Atherosclerosis of coronary artery bypass graft(s), unspecified, with other forms of angina pectoris: Secondary | ICD-10-CM | POA: Diagnosis not present

## 2016-06-04 NOTE — Progress Notes (Signed)
SUBJECTIVE: The patient is a 55 year old male who was seen in consultation on 02/13/2016 by Dr. Harl Bowie when he was hospitalized for chest pain. He has a history of coronary artery disease and CABG with prior PCI and chronic angina secondary to CTO of RCA and occluded SVG to RCA bypass with no revascularization options. He also has hypertension, COPD, hyperlipidemia, paroxysmal atrial fibrillation, and type 2 diabetes. He has normal LV systolic function.  He smokes a half pack of cigarettes daily. He sees pulmonology but hasn't in some time. He has chronic exertional dyspnea which has gotten slightly worse. He uses nitroglycerin spray about twice per week. He denies leg swelling.   Review of Systems: As per "subjective", otherwise negative.  Allergies  Allergen Reactions  . Contrast Media [Iodinated Diagnostic Agents] Other (See Comments)    Pt must be premedicated before given contrast media - stops heart  . Iohexol Other (See Comments)     Consult with radiologist before pre meds are given.Desc: PT. STATES HEART STOPPED HAS TO BE PREMED.     Current Outpatient Prescriptions  Medication Sig Dispense Refill  . amLODipine (NORVASC) 2.5 MG tablet Take 1 tablet (2.5 mg total) by mouth daily. 30 tablet 0  . apixaban (ELIQUIS) 5 MG TABS tablet Take 1 tablet (5 mg total) by mouth 2 (two) times daily. 1 tablet 0  . Choline Fenofibrate (TRILIPIX) 135 MG capsule Take 135 mg by mouth daily.    . diazepam (VALIUM) 5 MG tablet Take 5 mg by mouth 2 (two) times daily.    Marland Kitchen escitalopram (LEXAPRO) 10 MG tablet Take 10 mg by mouth daily.    . furosemide (LASIX) 80 MG tablet Take 80 mg by mouth daily.  4  . HYDROcodone-acetaminophen (NORCO) 5-325 MG per tablet Take 1-2 tablets by mouth every 4 (four) hours as needed for moderate pain. 60 tablet 0  . isosorbide mononitrate (IMDUR) 60 MG 24 hr tablet Take 1 tablet (60 mg total) by mouth 3 (three) times daily. Please make an appointment for additional  refills 45 tablet 0  . lisinopril (PRINIVIL,ZESTRIL) 5 MG tablet Take 0.5 tablets (2.5 mg total) by mouth daily.    . metoprolol (LOPRESSOR) 100 MG tablet Take 100 mg by mouth 2 (two) times daily.     . modafinil (PROVIGIL) 100 MG tablet Take 100 mg by mouth 2 (two) times daily as needed (shortness of breath).     . nitroGLYCERIN (NITROLINGUAL) 0.4 MG/SPRAY spray Place 1 spray under the tongue every 5 (five) minutes x 3 doses as needed for chest pain.    . pantoprazole (PROTONIX) 40 MG tablet TAKE 1 TABLET BY MOUTH 30 MINUTES BEFORE BREAKFAST 30 tablet 5  . potassium chloride SA (K-DUR,KLOR-CON) 20 MEQ tablet Take 1 tablet (20 mEq total) by mouth 2 (two) times daily. 30 tablet 0  . ranolazine (RANEXA) 1000 MG SR tablet Take 1 tablet (1,000 mg total) by mouth 2 (two) times daily. NEED APPOINTMENT FOR FUTURE REFILLS 30 tablet 0  . rosuvastatin (CRESTOR) 20 MG tablet Take 1 tablet (20 mg total) by mouth daily. Needs appointment for further refills 30 tablet 0  . sitaGLIPtin (JANUVIA) 100 MG tablet Take 100 mg by mouth daily.     Marland Kitchen Umeclidinium-Vilanterol 62.5-25 MCG/INH AEPB Inhale 1 puff into the lungs daily as needed (wheezing). Anoro Ellipta     No current facility-administered medications for this visit.     Past Medical History:  Diagnosis Date  . Abnormal myocardial  perfusion study 01/01/2011   there a small to moderate sized inferobasal scar  . Barrett's esophagus   . CAD (coronary artery disease) 2008   bypass grafting   . Cataracts, bilateral   . Chronic kidney disease    hx of kidney stones  . Claudication ([place]) 11/16/2011   PV test perform shows normal  . COPD (chronic obstructive pulmonary disease) ([place])   . Depression   . Diabetes ([place])    type 2 diabetes mellitus  . Dysrhythmia   . GERD (gastroesophageal reflux disease)   . Glaucoma   . Hernia of abdominal wall   . HTN (hypertension)   . Hyperlipidemia   . Morbid obesity ([place])   . Myocardial infarction ([place])  2008,2009,2009  . OSA (obstructive sleep apnea)    on cpap  . PAF (paroxysmal atrial fibrillation) ([place])   . S/P colonoscopy 2009   3-4 mm transverse colon erosions likely secondary to  ASA  . S/P endoscopy Dec 2011   moderate erosive gastritis, Barrett's esophagus 1-2cm  . Sleep apnea   . SOB (shortness of breath) 11/03/2007   2D Echo EF 50%-55%  . Stroke [facility])     Past Surgical History:  Procedure Laterality Date  . BIOPSY N/A 04/29/2015   Procedure: BIOPSY;  Surgeon: Danie Binder, MD;  Location: [facility];  Service: Endoscopy;  Laterality: N/A;  . CABG X 4  03/2008  . CARDIAC CATHETERIZATION  2009   stent placement to the left circumflex a 2.25   . CARDIAC CATHETERIZATION  07/08/2010  . CORONARY ARTERY BYPASS GRAFT  2008   4 vessels  . CORONARY STENT PLACEMENT  12/29/12  . CORONARY STENT PLACEMENT  12/2012  . ESOPHAGEAL DILATION N/A 04/29/2015   Procedure: ESOPHAGEAL DILATION 15 mm, 16 mm;  Surgeon: Danie Binder, MD;  Location: [facility];  Service: Endoscopy;  Laterality: N/A;  . ESOPHAGOGASTRODUODENOSCOPY  09/2011   Barrett's esophagus, no dysplasia on biopsies. Distal esophagitis. Status post dilation. Moderate gastritis and duodenitis, but biopsies benign. Next EGD in November 2015 for surveillance of Barrett's esophagus.  . ESOPHAGOGASTRODUODENOSCOPY (EGD) WITH PROPOFOL N/A 04/29/2015   SLF: 1. Barretts esophagus 2. Moderate non-erosive gastritis.   Marland Kitchen HERNIA REPAIR     ventral hernia repair  . LEFT HEART CATHETERIZATION WITH CORONARY/GRAFT ANGIOGRAM N/A 12/29/2012   Procedure: LEFT HEART CATHETERIZATION WITH Beatrix Fetters;  Surgeon: Troy Sine, MD;  Location[facility] CATH LAB;  Service: Cardiovascular;  Laterality: N/A;  . ORIF FIBULA FRACTURE Right 06/03/2015   Procedure: OPEN REDUCTION INTERNAL FIXATION (ORIF) DISTAL FIBULA  FRACTURE;  Surgeon: Garald Balding, MD;  Location: [facility];  Service: Orthopedics;  Laterality: Right;  . PERCUTANEOUS CORONARY STENT INTERVENTION  (PCI-S)  12/29/2012   Procedure: PERCUTANEOUS CORONARY STENT INTERVENTION (PCI-S);  Surgeon: Troy Sine, MD;  Location: [facility] CATH LAB;  Service: Cardiovascular;;  . SAVORY DILATION  09/06/2011   Procedure: SAVORY DILATION;  Surgeon: Dorothyann Peng, MD;  Location: [facility];  Service: Endoscopy;  Laterality: N/A;  Dilated with 72m    Social History   Social History  . Marital status: Divorced    Spouse name: N/A  . Number of children: N/A  . Years of education: N/A   Occupational History  . Not on file.   Social History Main Topics  . Smoking status: Current Every Day Smoker    Packs/day: 0.50    Years: 30.00    Types: Cigarettes  . Smokeless tobacco: Never Used  . Alcohol  use Yes     Comment: every other day, one drink of beer or liquor  . Drug use: No  . Sexual activity: No   Other Topics Concern  . Not on file   Social History Narrative  . No narrative on file     Vitals:   06/04/16 0836  BP: (!) 144/84  Pulse: 91  SpO2: 91%  Weight: 297 lb (134.7 kg)  Height: '5\' 10"'$  (1.778 m)    PHYSICAL EXAM General: NAD HEENT: Normal. Neck: No JVD, no thyromegaly. Lungs: Diffuse insp/exp wheezes, no rales CV: Nondisplaced PMI.  Regular rate and rhythm, normal S1/S2, no S3/S4, no murmur. No pretibial or periankle edema.  No carotid bruit.   Abdomen: Obese.  Neurologic: Alert and oriented.  Psych: Normal affect. Skin: Normal.    ECG: Most recent ECG reviewed.      ASSESSMENT AND PLAN: 1. CAD with CABG and PCI and chronic angina: Taking Ranexa, Crestor, metoprolol, and long-acting nitrates. No changes to therapy.  2. PAF: Anticoagulated with Eliquis. HR controlled with metoprolol.  3. Essential HTN: Mildly elevated. No changes.  4. Hyperlipidemia: Continue Crestor.  5. Tobacco abuse disorder: Continues to smoke 0.5 ppd.  6. COPD: Encouraged to see Dr. Luan Pulling.  Dispo: fu 3 months with Dr. Neldon Labella, M.D., F.A.C.C.

## 2016-06-04 NOTE — Patient Instructions (Signed)
Medication Instructions:  Your physician recommends that you continue on your current medications as directed. Please refer to the Current Medication list given to you today.   Labwork: none  Testing/Procedures: none  Follow-Up: Your physician recommends that you schedule a follow-up appointment in: 3 months with Dr. Harl Bowie    Any Other Special Instructions Will Be Listed Below (If Applicable).     If you need a refill on your cardiac medications before your next appointment, please call your pharmacy.

## 2016-06-07 ENCOUNTER — Ambulatory Visit: Payer: Medicare HMO | Attending: Neurology | Admitting: Neurology

## 2016-06-07 DIAGNOSIS — G4733 Obstructive sleep apnea (adult) (pediatric): Secondary | ICD-10-CM | POA: Diagnosis not present

## 2016-06-07 DIAGNOSIS — I6789 Other cerebrovascular disease: Secondary | ICD-10-CM | POA: Diagnosis not present

## 2016-06-07 DIAGNOSIS — J449 Chronic obstructive pulmonary disease, unspecified: Secondary | ICD-10-CM | POA: Insufficient documentation

## 2016-06-07 DIAGNOSIS — G4710 Hypersomnia, unspecified: Secondary | ICD-10-CM | POA: Diagnosis not present

## 2016-06-07 DIAGNOSIS — I2510 Atherosclerotic heart disease of native coronary artery without angina pectoris: Secondary | ICD-10-CM | POA: Insufficient documentation

## 2016-06-13 NOTE — Procedures (Signed)
[place] A. Merlene Laughter, MD     [email or web address]             NOCTURNAL POLYSOMNOGRAPHY   LOCATION: [facility]  Patient Name: [name], [name] Date: 06/07/2016 Gender: Male D.O.B: Aug 02, 1961 Age (years): 74 Referring Provider: Not Available Height (inches): 69 Interpreting Physician: Phillips Odor MD, ABSM Weight (lbs): 297 RPSGT: Rosebud Poles BMI: 44 MRN: [id number] Neck Size: 21.00 CLINICAL INFORMATION The patient is referred for a CPAP titration to treat sleep apnea. Date of NPSG, Split Night or HST: SLEEP STUDY TECHNIQUE As per the AASM Manual for the Scoring of Sleep and Associated Events v2.3 (April 2016) with a hypopnea requiring 4% desaturations. The channels recorded and monitored were frontal, central and occipital EEG, electrooculogram (EOG), submentalis EMG (chin), nasal and oral airflow, thoracic and abdominal wall motion, anterior tibialis EMG, snore microphone, electrocardiogram, and pulse oximetry. Continuous positive airway pressure (CPAP) was initiated at the beginning of the study and titrated to treat sleep-disordered breathing. MEDICATIONS Medications taken by the patient : N/A  Medications administered by patient during sleep study : No sleep medicine administered.  Current Outpatient Prescriptions:  .  amLODipine (NORVASC) 2.5 MG tablet, Take 1 tablet (2.5 mg total) by mouth daily., Disp: 30 tablet, Rfl: 0 .  apixaban (ELIQUIS) 5 MG TABS tablet, Take 1 tablet (5 mg total) by mouth 2 (two) times daily., Disp: 1 tablet, Rfl: 0 .  Choline Fenofibrate (TRILIPIX) 135 MG capsule, Take 135 mg by mouth daily., Disp: , Rfl:  .  diazepam (VALIUM) 5 MG tablet, Take 5 mg by mouth 2 (two) times daily., Disp: , Rfl:  .  escitalopram (LEXAPRO) 10 MG tablet, Take 10 mg by mouth daily., Disp: , Rfl:  .  furosemide (LASIX) 80 MG tablet, Take 80 mg by mouth daily., Disp: , Rfl: 4 .  HYDROcodone-acetaminophen (NORCO) 5-325 MG per tablet, Take 1-2  tablets by mouth every 4 (four) hours as needed for moderate pain., Disp: 60 tablet, Rfl: 0 .  isosorbide mononitrate (IMDUR) 60 MG 24 hr tablet, Take 1 tablet (60 mg total) by mouth 3 (three) times daily. Please make an appointment for additional refills, Disp: 45 tablet, Rfl: 0 .  lisinopril (PRINIVIL,ZESTRIL) 5 MG tablet, Take 0.5 tablets (2.5 mg total) by mouth daily., Disp: , Rfl:  .  metoprolol (LOPRESSOR) 100 MG tablet, Take 100 mg by mouth 2 (two) times daily. , Disp: , Rfl:  .  modafinil (PROVIGIL) 100 MG tablet, Take 100 mg by mouth 2 (two) times daily as needed (shortness of breath). , Disp: , Rfl:  .  nitroGLYCERIN (NITROLINGUAL) 0.4 MG/SPRAY spray, Place 1 spray under the tongue every 5 (five) minutes x 3 doses as needed for chest pain., Disp: , Rfl:  .  pantoprazole (PROTONIX) 40 MG tablet, TAKE 1 TABLET BY MOUTH 30 MINUTES BEFORE BREAKFAST, Disp: 30 tablet, Rfl: 5 .  potassium chloride SA (K-DUR,KLOR-CON) 20 MEQ tablet, Take 1 tablet (20 mEq total) by mouth 2 (two) times daily., Disp: 30 tablet, Rfl: 0 .  ranolazine (RANEXA) 1000 MG SR tablet, Take 1 tablet (1,000 mg total) by mouth 2 (two) times daily. NEED APPOINTMENT FOR FUTURE REFILLS, Disp: 30 tablet, Rfl: 0 .  rosuvastatin (CRESTOR) 20 MG tablet, Take 1 tablet (20 mg total) by mouth daily. Needs appointment for further refills, Disp: 30 tablet, Rfl: 0 .  sitaGLIPtin (JANUVIA) 100 MG tablet, Take 100 mg by mouth daily. , Disp: , Rfl:  .  Umeclidinium-Vilanterol 62.5-25 MCG/INH AEPB, Inhale 1  puff into the lungs daily as needed (wheezing). Anoro Ellipta, Disp: , Rfl:   TECHNICIAN COMMENTS Comments added by technician: Patient had difficulty initiating sleep. Patient took a long nap prior to Sleep study. PLMS noticed throughout the entire study  Comments added by scorer: N/A RESPIRATORY PARAMETERS Optimal PAP Pressure (cm):  AHI at Optimal Pressure (/hr): N/A Overall Minimal O2 (%): 86.00 Supine % at Optimal Pressure  (%): N/A Minimal O2 at Optimal Pressure (%): 85.00   SLEEP ARCHITECTURE The study was initiated at 11:15:43 PM and ended at 5:18:20 AM. Sleep onset time was 72.8 minutes and the sleep efficiency was 68.5%. The total sleep time was 248.4 minutes. The patient spent 0.81% of the night in stage N1 sleep, 42.28% in stage N2 sleep, 31.35% in stage N3 and 25.57% in REM.Stage REM latency was 108.5 minutes Wake after sleep onset was 41.5. Alpha intrusion was absent. Supine sleep was 22.72%. CARDIAC DATA The 2 lead EKG demonstrated sinus rhythm. The mean heart rate was N/A beats per minute. Other EKG findings include: None. LEG MOVEMENT DATA The total Periodic Limb Movements of Sleep (PLMS) were 0. The PLMS index was 0.00. A PLMS index of <15 is considered normal in adults.   IMPRESSIONS Successful CPAP titration recording with optimal pressure of 8.   Delano Metz, MD Diplomate, American Board of Sleep Medicine.

## 2016-06-17 DIAGNOSIS — H2513 Age-related nuclear cataract, bilateral: Secondary | ICD-10-CM | POA: Diagnosis not present

## 2016-06-17 DIAGNOSIS — E119 Type 2 diabetes mellitus without complications: Secondary | ICD-10-CM | POA: Diagnosis not present

## 2016-06-17 DIAGNOSIS — H538 Other visual disturbances: Secondary | ICD-10-CM | POA: Diagnosis not present

## 2016-06-24 DIAGNOSIS — I6789 Other cerebrovascular disease: Secondary | ICD-10-CM | POA: Diagnosis not present

## 2016-06-24 DIAGNOSIS — G4733 Obstructive sleep apnea (adult) (pediatric): Secondary | ICD-10-CM | POA: Diagnosis not present

## 2016-06-24 DIAGNOSIS — R5381 Other malaise: Secondary | ICD-10-CM | POA: Diagnosis not present

## 2016-06-24 DIAGNOSIS — G4710 Hypersomnia, unspecified: Secondary | ICD-10-CM | POA: Diagnosis not present

## 2016-06-24 DIAGNOSIS — I25118 Atherosclerotic heart disease of native coronary artery with other forms of angina pectoris: Secondary | ICD-10-CM | POA: Diagnosis not present

## 2016-06-24 DIAGNOSIS — R413 Other amnesia: Secondary | ICD-10-CM | POA: Diagnosis not present

## 2016-06-30 ENCOUNTER — Encounter: Payer: Self-pay | Admitting: Gastroenterology

## 2016-07-01 ENCOUNTER — Ambulatory Visit (INDEPENDENT_AMBULATORY_CARE_PROVIDER_SITE_OTHER): Payer: Medicare HMO | Admitting: Otolaryngology

## 2016-07-01 DIAGNOSIS — R490 Dysphonia: Secondary | ICD-10-CM

## 2016-07-01 DIAGNOSIS — R070 Pain in throat: Secondary | ICD-10-CM

## 2016-07-01 DIAGNOSIS — K219 Gastro-esophageal reflux disease without esophagitis: Secondary | ICD-10-CM

## 2016-07-06 DIAGNOSIS — G4733 Obstructive sleep apnea (adult) (pediatric): Secondary | ICD-10-CM | POA: Diagnosis not present

## 2016-07-20 DIAGNOSIS — Z0000 Encounter for general adult medical examination without abnormal findings: Secondary | ICD-10-CM | POA: Diagnosis not present

## 2016-07-20 DIAGNOSIS — I259 Chronic ischemic heart disease, unspecified: Secondary | ICD-10-CM | POA: Diagnosis not present

## 2016-07-20 DIAGNOSIS — I252 Old myocardial infarction: Secondary | ICD-10-CM | POA: Diagnosis not present

## 2016-07-20 DIAGNOSIS — I10 Essential (primary) hypertension: Secondary | ICD-10-CM | POA: Diagnosis not present

## 2016-07-20 DIAGNOSIS — E119 Type 2 diabetes mellitus without complications: Secondary | ICD-10-CM | POA: Diagnosis not present

## 2016-07-20 DIAGNOSIS — E785 Hyperlipidemia, unspecified: Secondary | ICD-10-CM | POA: Diagnosis not present

## 2016-07-20 DIAGNOSIS — K219 Gastro-esophageal reflux disease without esophagitis: Secondary | ICD-10-CM | POA: Diagnosis not present

## 2016-07-20 DIAGNOSIS — I509 Heart failure, unspecified: Secondary | ICD-10-CM | POA: Diagnosis not present

## 2016-07-20 DIAGNOSIS — J449 Chronic obstructive pulmonary disease, unspecified: Secondary | ICD-10-CM | POA: Diagnosis not present

## 2016-08-05 DIAGNOSIS — G4733 Obstructive sleep apnea (adult) (pediatric): Secondary | ICD-10-CM | POA: Diagnosis not present

## 2016-08-10 DIAGNOSIS — Z1389 Encounter for screening for other disorder: Secondary | ICD-10-CM | POA: Diagnosis not present

## 2016-08-10 DIAGNOSIS — I2510 Atherosclerotic heart disease of native coronary artery without angina pectoris: Secondary | ICD-10-CM | POA: Diagnosis not present

## 2016-08-10 DIAGNOSIS — E782 Mixed hyperlipidemia: Secondary | ICD-10-CM | POA: Diagnosis not present

## 2016-08-10 DIAGNOSIS — Z6841 Body Mass Index (BMI) 40.0 and over, adult: Secondary | ICD-10-CM | POA: Diagnosis not present

## 2016-08-10 DIAGNOSIS — I10 Essential (primary) hypertension: Secondary | ICD-10-CM | POA: Diagnosis not present

## 2016-08-10 DIAGNOSIS — E118 Type 2 diabetes mellitus with unspecified complications: Secondary | ICD-10-CM | POA: Diagnosis not present

## 2016-08-18 ENCOUNTER — Other Ambulatory Visit: Payer: Self-pay | Admitting: Nurse Practitioner

## 2016-08-20 DIAGNOSIS — G4733 Obstructive sleep apnea (adult) (pediatric): Secondary | ICD-10-CM | POA: Diagnosis not present

## 2016-08-20 DIAGNOSIS — R413 Other amnesia: Secondary | ICD-10-CM | POA: Diagnosis not present

## 2016-08-20 DIAGNOSIS — R5381 Other malaise: Secondary | ICD-10-CM | POA: Diagnosis not present

## 2016-08-20 DIAGNOSIS — G4710 Hypersomnia, unspecified: Secondary | ICD-10-CM | POA: Diagnosis not present

## 2016-08-20 DIAGNOSIS — I25118 Atherosclerotic heart disease of native coronary artery with other forms of angina pectoris: Secondary | ICD-10-CM | POA: Diagnosis not present

## 2016-08-20 DIAGNOSIS — I6789 Other cerebrovascular disease: Secondary | ICD-10-CM | POA: Diagnosis not present

## 2016-08-23 ENCOUNTER — Encounter (HOSPITAL_COMMUNITY): Payer: Self-pay | Admitting: Emergency Medicine

## 2016-08-23 ENCOUNTER — Emergency Department (HOSPITAL_COMMUNITY): Payer: Medicare HMO

## 2016-08-23 ENCOUNTER — Emergency Department (HOSPITAL_COMMUNITY)
Admission: EM | Admit: 2016-08-23 | Discharge: 2016-08-23 | Discharge status: Against Medical Advice | Payer: Medicare HMO | Attending: Emergency Medicine | Admitting: Emergency Medicine

## 2016-08-23 DIAGNOSIS — Z79899 Other long term (current) drug therapy: Secondary | ICD-10-CM | POA: Insufficient documentation

## 2016-08-23 DIAGNOSIS — F17210 Nicotine dependence, cigarettes, uncomplicated: Secondary | ICD-10-CM | POA: Insufficient documentation

## 2016-08-23 DIAGNOSIS — N189 Chronic kidney disease, unspecified: Secondary | ICD-10-CM | POA: Diagnosis not present

## 2016-08-23 DIAGNOSIS — I2510 Atherosclerotic heart disease of native coronary artery without angina pectoris: Secondary | ICD-10-CM | POA: Diagnosis not present

## 2016-08-23 DIAGNOSIS — Z7984 Long term (current) use of oral hypoglycemic drugs: Secondary | ICD-10-CM | POA: Diagnosis not present

## 2016-08-23 DIAGNOSIS — Z951 Presence of aortocoronary bypass graft: Secondary | ICD-10-CM | POA: Diagnosis not present

## 2016-08-23 DIAGNOSIS — I129 Hypertensive chronic kidney disease with stage 1 through stage 4 chronic kidney disease, or unspecified chronic kidney disease: Secondary | ICD-10-CM | POA: Diagnosis not present

## 2016-08-23 DIAGNOSIS — J441 Chronic obstructive pulmonary disease with (acute) exacerbation: Secondary | ICD-10-CM | POA: Insufficient documentation

## 2016-08-23 DIAGNOSIS — E1122 Type 2 diabetes mellitus with diabetic chronic kidney disease: Secondary | ICD-10-CM | POA: Insufficient documentation

## 2016-08-23 DIAGNOSIS — R079 Chest pain, unspecified: Secondary | ICD-10-CM

## 2016-08-23 DIAGNOSIS — I252 Old myocardial infarction: Secondary | ICD-10-CM | POA: Insufficient documentation

## 2016-08-23 DIAGNOSIS — R0602 Shortness of breath: Secondary | ICD-10-CM | POA: Diagnosis present

## 2016-08-23 DIAGNOSIS — G4710 Hypersomnia, unspecified: Secondary | ICD-10-CM | POA: Diagnosis not present

## 2016-08-23 DIAGNOSIS — R5381 Other malaise: Secondary | ICD-10-CM | POA: Diagnosis not present

## 2016-08-23 DIAGNOSIS — G4733 Obstructive sleep apnea (adult) (pediatric): Secondary | ICD-10-CM | POA: Diagnosis not present

## 2016-08-23 DIAGNOSIS — R413 Other amnesia: Secondary | ICD-10-CM | POA: Diagnosis not present

## 2016-08-23 DIAGNOSIS — I6789 Other cerebrovascular disease: Secondary | ICD-10-CM | POA: Diagnosis not present

## 2016-08-23 DIAGNOSIS — R69 Illness, unspecified: Secondary | ICD-10-CM | POA: Diagnosis not present

## 2016-08-23 LAB — CBC
HCT: 47.8 % (ref 39.0–52.0)
Hemoglobin: 17.2 g/dL — ABNORMAL HIGH (ref 13.0–17.0)
MCH: 34.3 pg — AB (ref 26.0–34.0)
MCHC: 36 g/dL (ref 30.0–36.0)
MCV: 95.4 fL (ref 78.0–100.0)
PLATELETS: 283 10*3/uL (ref 150–400)
RBC: 5.01 MIL/uL (ref 4.22–5.81)
RDW: 12.6 % (ref 11.5–15.5)
WBC: 15.1 10*3/uL — AB (ref 4.0–10.5)

## 2016-08-23 LAB — BASIC METABOLIC PANEL
Anion gap: 9 (ref 5–15)
BUN: 10 mg/dL (ref 6–20)
CALCIUM: 9.6 mg/dL (ref 8.9–10.3)
CHLORIDE: 101 mmol/L (ref 101–111)
CO2: 26 mmol/L (ref 22–32)
CREATININE: 0.97 mg/dL (ref 0.61–1.24)
GFR calc Af Amer: >60 mL/min (ref >60)
GFR calc non Af Amer: >60 mL/min (ref >60)
Glucose, Bld: 164 mg/dL — ABNORMAL HIGH (ref 65–99)
Potassium: 3.9 mmol/L (ref 3.5–5.1)
SODIUM: 136 mmol/L (ref 135–145)

## 2016-08-23 LAB — TROPONIN I: Troponin I: <0.03 ng/mL (ref <0.03)

## 2016-08-23 LAB — BRAIN NATRIURETIC PEPTIDE: B Natriuretic Peptide: 20 pg/mL (ref 0.0–100.0)

## 2016-08-23 LAB — D-DIMER, QUANTITATIVE (NOT AT ARMC): D DIMER QUANT: 0.33 ug{FEU}/mL (ref 0.00–0.50)

## 2016-08-23 MED ORDER — METHYLPREDNISOLONE SODIUM SUCC 125 MG IJ SOLR
125.0000 mg | Freq: Once | INTRAMUSCULAR | Status: AC
  Administered 2016-08-23: 125 mg via INTRAVENOUS
  Filled 2016-08-23: qty 2

## 2016-08-23 MED ORDER — ALBUTEROL SULFATE HFA 108 (90 BASE) MCG/ACT IN AERS: 2.0000 | INHALATION_SPRAY | RESPIRATORY_TRACT | 0 refills | Status: DC | PRN

## 2016-08-23 MED ORDER — PREDNISONE 20 MG PO TABS: 40.0000 mg | ORAL_TABLET | Freq: Every day | ORAL | 0 refills | Status: DC

## 2016-08-23 MED ORDER — ALBUTEROL (5 MG/ML) CONTINUOUS INHALATION SOLN
10.0000 mg/h | INHALATION_SOLUTION | Freq: Once | RESPIRATORY_TRACT | Status: AC
  Administered 2016-08-23: 10 mg/h via RESPIRATORY_TRACT
  Filled 2016-08-23: qty 20

## 2016-08-23 MED ORDER — IPRATROPIUM BROMIDE 0.02 % IN SOLN
1.0000 mg | Freq: Once | RESPIRATORY_TRACT | Status: AC
  Administered 2016-08-23: 1 mg via RESPIRATORY_TRACT
  Filled 2016-08-23: qty 5

## 2016-08-23 NOTE — ED Notes (Addendum)
Pt says he is unable to admitted as he must go home to care for his 53 year old mother.  Pt says there is no one else to care for her. Pt says he feels better after receiving breathing treatments and will sign out AMA.

## 2016-08-23 NOTE — ED Triage Notes (Signed)
Pt reports sob for the past week, yet is always sob.  Pt reports cp last week.  Pt has pain in ribs at this time.  Pt alert and oriented at this time.

## 2016-08-23 NOTE — ED Notes (Signed)
   Patient Saturations on Room Air while Ambulating = 87%

## 2016-08-23 NOTE — ED Notes (Signed)
Pt ambulated around nursing station dropping to 87% O2.

## 2016-08-23 NOTE — ED Provider Notes (Signed)
[facility] DEPT Provider Note   CSN: [id number] Arrival date & time: 08/23/16  1510     History   Chief Complaint Chief Complaint  Patient presents with  . Shortness of Breath    HPI [name] is a 55 y.o. male.  HPI  Pt was seen at 1620. Per pt, c/o gradual onset and worsening of persistent SOB and wheezing for "a lot time" worse over the past few weeks. Pt also has had intermittent episodes of bilateral lateral ribs "pains" for the past several weeks, associated with diaphoresis and SOB, lasting approximately 3 hours each episode, and resolving with his SL ntg, lying down, and occasionally his bipap. Pt was evaluated by his Neuro MD today for sleep apnea, and was sent to the ED for further evaluation. Pt endorses extensive CAD hx "that they say they can't do anything about." Denies palpitations, no back pain, no abd pain, no N/V/D, no fevers.    Past Medical History:  Diagnosis Date  . Abnormal myocardial perfusion study 01/01/2011   there a small to moderate sized inferobasal scar  . Barrett's esophagus   . CAD (coronary artery disease) 2008   bypass grafting   . Cataracts, bilateral   . Chronic kidney disease    hx of kidney stones  . Claudication ([place]) 11/16/2011   PV test perform shows normal  . COPD (chronic obstructive pulmonary disease) ([place])   . Depression   . Diabetes ([place])    type 2 diabetes mellitus  . Dysrhythmia   . GERD (gastroesophageal reflux disease)   . Glaucoma   . Hernia of abdominal wall   . HTN (hypertension)   . Hyperlipidemia   . Morbid obesity ([place])   . Myocardial infarction 2008,2009,2009  . OSA (obstructive sleep apnea)    on cpap  . PAF (paroxysmal atrial fibrillation) ([place])   . S/P colonoscopy 2009   3-4 mm transverse colon erosions likely secondary to  ASA  . S/P endoscopy Dec 2011   moderate erosive gastritis, Barrett's esophagus 1-2cm  . Sleep apnea   . SOB (shortness of breath) 11/03/2007   2D Echo EF 50%-55%  .  Stroke [facility])     Patient Active Problem List   Diagnosis Date Noted  . COPD exacerbation ([place])   . Fracture of fibula, right, closed 06/03/2015  . Dysphagia 04/07/2015  . Diabetes mellitus, type 2 ([place]) 04/28/2014  . Obesity, unspecified 04/28/2014  . CVA (cerebral infarction) 04/27/2014  . Aphasia 04/27/2014  . Pneumothorax on right 03/16/2014  . Lung nodule 03/16/2014  . Chest pain 03/15/2014  . Angina, class III ([place]) 03/14/2013  . Tobacco abuse 03/14/2013  . Depression 03/14/2013  . Unstable angina ([place]) 12/30/2012  . Abnormal nuclear stress test 12/30/2012  . Barrett's esophagus 08/25/2011  . ABDOMINAL PAIN, EPIGASTRIC 09/29/2010  . GERD 06/01/2010  . HEMATOCHEZIA 06/01/2010  . HEMATURIA UNSPECIFIED 06/01/2010  . ABDOMINAL PAIN 06/01/2010  . Obstructive sleep apnea 06/21/2008  . RESTLESS LEGS SYNDROME 06/21/2008  . MYOCARDIAL INFARCTION 06/21/2008  . ALLERGIC RHINITIS 06/21/2008  . HYPERLIPIDEMIA 06/20/2008  . Morbid obesity ([place]) 06/20/2008  . HYPERTENSION 06/20/2008  . CORONARY ARTERY DISEASE 06/20/2008  . PAROXYSMAL ATRIAL FIBRILLATION 06/20/2008    Past Surgical History:  Procedure Laterality Date  . BIOPSY N/A 04/29/2015   Procedure: BIOPSY;  Surgeon: Danie Binder, MD;  Location: [facility];  Service: Endoscopy;  Laterality: N/A;  . CABG X 4  03/2008  . CARDIAC CATHETERIZATION  2009   stent placement  to the left circumflex a 2.25   . CARDIAC CATHETERIZATION  07/08/2010  . CORONARY ARTERY BYPASS GRAFT  2008   4 vessels  . CORONARY STENT PLACEMENT  12/29/12  . CORONARY STENT PLACEMENT  12/2012  . ESOPHAGEAL DILATION N/A 04/29/2015   Procedure: ESOPHAGEAL DILATION 15 mm, 16 mm;  Surgeon: Danie Binder, MD;  Location: [facility];  Service: Endoscopy;  Laterality: N/A;  . ESOPHAGOGASTRODUODENOSCOPY  09/2011   Barrett's esophagus, no dysplasia on biopsies. Distal esophagitis. Status post dilation. Moderate gastritis and duodenitis, but biopsies benign. Next EGD in  November 2015 for surveillance of Barrett's esophagus.  . ESOPHAGOGASTRODUODENOSCOPY (EGD) WITH PROPOFOL N/A 04/29/2015   SLF: 1. Barretts esophagus 2. Moderate non-erosive gastritis.   Marland Kitchen HERNIA REPAIR     ventral hernia repair  . LEFT HEART CATHETERIZATION WITH CORONARY/GRAFT ANGIOGRAM N/A 12/29/2012   Procedure: LEFT HEART CATHETERIZATION WITH Beatrix Fetters;  Surgeon: Troy Sine, MD;  Location[facility] LAB;  Service: Cardiovascular;  Laterality: N/A;  . ORIF FIBULA FRACTURE Right 06/03/2015   Procedure: OPEN REDUCTION INTERNAL FIXATION (ORIF) DISTAL FIBULA  FRACTURE;  Surgeon: Garald Balding, MD;  Location: [facility];  Service: Orthopedics;  Laterality: Right;  . PERCUTANEOUS CORONARY STENT INTERVENTION (PCI-S)  12/29/2012   Procedure: PERCUTANEOUS CORONARY STENT INTERVENTION (PCI-S);  Surgeon: Troy Sine, MD;  Location: [facility] CATH LAB;  Service: Cardiovascular;;  . SAVORY DILATION  09/06/2011   Procedure: SAVORY DILATION;  Surgeon: Dorothyann Peng, MD;  Location: [facility];  Service: Endoscopy;  Laterality: N/A;  Dilated with 44m       Home Medications    Prior to Admission medications   Medication Sig Start Date End Date Taking? Authorizing Provider  amLODipine (NORVASC) 2.5 MG tablet Take 1 tablet (2.5 mg total) by mouth daily. 02/13/16   DSamuella Cota MD  apixaban (ELIQUIS) 5 MG TABS tablet Take 1 tablet (5 mg total) by mouth 2 (two) times daily. 06/04/15   BCherylann Ratel PA-C  Choline Fenofibrate (TRILIPIX) 135 MG capsule Take 135 mg by mouth daily.    Historical Provider, MD  diazepam (VALIUM) 5 MG tablet Take 5 mg by mouth 2 (two) times daily.    Historical Provider, MD  escitalopram (LEXAPRO) 10 MG tablet Take 10 mg by mouth daily. 04/27/14   Historical Provider, MD  furosemide (LASIX) 80 MG tablet Take 80 mg by mouth daily. 04/22/15   Historical Provider, MD  HYDROcodone-acetaminophen (NORCO) 5-325 MG per tablet Take 1-2 tablets by mouth every 4 (four) hours as needed  for moderate pain. 06/03/15   BCherylann Ratel PA-C  isosorbide mononitrate (IMDUR) 60 MG 24 hr tablet Take 1 tablet (60 mg total) by mouth 3 (three) times daily. Please make an appointment for additional refills 03/21/15   MSanda Klein MD  lisinopril (PRINIVIL,ZESTRIL) 5 MG tablet Take 0.5 tablets (2.5 mg total) by mouth daily. 02/13/16   DSamuella Cota MD  metoprolol (LOPRESSOR) 100 MG tablet Take 100 mg by mouth 2 (two) times daily.  08/08/14   Historical Provider, MD  modafinil (PROVIGIL) 100 MG tablet Take 100 mg by mouth 2 (two) times daily as needed (shortness of breath).     Historical Provider, MD  nitroGLYCERIN (NITROLINGUAL) 0.4 MG/SPRAY spray Place 1 spray under the tongue every 5 (five) minutes x 3 doses as needed for chest pain.    Historical Provider, MD  pantoprazole (PROTONIX) 40 MG tablet TAKE 1 TABLET BY MOUTH 30 MINUTES BEFORE BREAKFAST 08/19/16  Annitta Needs, NP  potassium chloride SA (K-DUR,KLOR-CON) 20 MEQ tablet Take 1 tablet (20 mEq total) by mouth 2 (two) times daily. 07/22/14   Mihai Croitoru, MD  ranolazine (RANEXA) 1000 MG SR tablet Take 1 tablet (1,000 mg total) by mouth 2 (two) times daily. NEED APPOINTMENT FOR FUTURE REFILLS 07/22/14   Sanda Klein, MD  rosuvastatin (CRESTOR) 20 MG tablet Take 1 tablet (20 mg total) by mouth daily. Needs appointment for further refills 02/13/16   Samuella Cota, MD  sitaGLIPtin (JANUVIA) 100 MG tablet Take 100 mg by mouth daily.     Historical Provider, MD  Umeclidinium-Vilanterol 62.5-25 MCG/INH AEPB Inhale 1 puff into the lungs daily as needed (wheezing). Anoro Ellipta    Historical Provider, MD    Family History Family History  Problem Relation Age of Onset  . Diabetes Mother   . Heart disease Mother   . Diabetes Father   . Heart disease Father   . Colon cancer Neg Hx   . Anesthesia problems Neg Hx   . Hypotension Neg Hx   . Malignant hyperthermia Neg Hx   . Pseudochol deficiency Neg Hx     Social History Social  History  Substance Use Topics  . Smoking status: Current Every Day Smoker    Packs/day: 0.50    Years: 30.00    Types: Cigarettes  . Smokeless tobacco: Never Used  . Alcohol use Yes     Comment: every other day, one drink of beer or liquor     Allergies   Contrast media [iodinated diagnostic agents] and Iohexol   Review of Systems Review of Systems ROS: Statement: All systems negative except as marked or noted in the HPI; Constitutional: Negative for fever and chills. ; ; Eyes: Negative for eye pain, redness and discharge. ; ; ENMT: Negative for ear pain, hoarseness, nasal congestion, sinus pressure and sore throat. ; ; Cardiovascular: +CP, diaphoresis. Negative for palpitations, and peripheral edema. ; ; Respiratory: +wheezing, SOB. Negative for stridor. ; ; Gastrointestinal: Negative for nausea, vomiting, diarrhea, abdominal pain, blood in stool, hematemesis, jaundice and rectal bleeding. . ; ; Genitourinary: Negative for dysuria, flank pain and hematuria. ; ; Musculoskeletal: Negative for back pain and neck pain. Negative for swelling and trauma.; ; Skin: Negative for pruritus, rash, abrasions, blisters, bruising and skin lesion.; ; Neuro: Negative for headache, lightheadedness and neck stiffness. Negative for weakness, altered level of consciousness, altered mental status, extremity weakness, paresthesias, involuntary movement, seizure and syncope.       Physical Exam Updated Vital Signs BP 126/91   Pulse 81   Temp 98.7 F (37.1 C) (Oral)   Resp 16   Ht '5\' 10"'$  (1.778 m)   Wt 288 lb (130.6 kg)   SpO2 98%   BMI 41.32 kg/m   Physical Exam 1625: Physical examination:  Nursing notes reviewed; Vital signs and O2 SAT reviewed;  Constitutional: Well developed, Well nourished, Well hydrated, Uncomfortable appearing.; Head:  Normocephalic, atraumatic; Eyes: EOMI, PERRL, No scleral icterus; ENMT: Mouth and pharynx normal, Mucous membranes moist; Neck: Supple, Full range of motion, No  lymphadenopathy; Cardiovascular: Regular rate and rhythm, No gallop; Respiratory: Breath sounds diminished & equal bilaterally, insp/exp wheezes, occasional audible wheezing. Speaking short sentences. Tachypneic. Sitting upright.; Chest: Nontender, Movement normal; Abdomen: Soft, Nontender, Nondistended, Normal bowel sounds; Genitourinary: No CVA tenderness; Extremities: Pulses normal, No tenderness, No edema, No calf edema or asymmetry.; Neuro: AA&Ox3, Major CN grossly intact.  Speech clear. No gross focal motor or sensory deficits  in extremities.; Skin: Color normal, Warm, Dry.   ED Treatments / Results  Labs (all labs ordered are listed, but only abnormal results are displayed)   EKG  EKG Interpretation  Date/Time:  Monday August 23 2016 15:15:46 EDT Ventricular Rate:  84 PR Interval:  136 QRS Duration: 88 QT Interval:  366 QTC Calculation: 432 R Axis:   44 Text Interpretation:  Normal sinus rhythm Nonspecific ST abnormality Baseline wander When compared with ECG of 02/13/2016 QT has shortened Otherwise no significant change Confirmed by [facility]  MD, Nunzio Cory [phone]) on 08/23/2016 4:46:09 PM       Radiology   Procedures Procedures (including critical care time)  Medications Ordered in ED Medications  methylPREDNISolone sodium succinate (SOLU-MEDROL) 125 mg/2 mL injection 125 mg (125 mg Intravenous Given 08/23/16 1636)  albuterol (PROVENTIL,VENTOLIN) solution continuous neb (10 mg/hr Nebulization Given 08/23/16 1644)  ipratropium (ATROVENT) nebulizer solution 1 mg (1 mg Nebulization Given 08/23/16 1644)     Initial Impression / Assessment and Plan / ED Course  I have reviewed the triage vital signs and the nursing notes.  Pertinent labs & imaging results that were available during my care of the patient were reviewed by me and considered in my medical decision making (see chart for details).  MDM Reviewed: previous chart, nursing note and vitals Reviewed previous: labs  and ECG Interpretation: labs, ECG and x-ray Total time providing critical care: 30-74 minutes. This excludes time spent performing separately reportable procedures and services. Consults: admitting MD   CRITICAL CARE Performed by: Alfonzo Feller Total critical care time: 35 minutes Critical care time was exclusive of separately billable procedures and treating other patients. Critical care was necessary to treat or prevent imminent or life-threatening deterioration. Critical care was time spent personally by me on the following activities: development of treatment plan with patient and/or surrogate as well as nursing, discussions with consultants, evaluation of patient's response to treatment, examination of patient, obtaining history from patient or surrogate, ordering and performing treatments and interventions, ordering and review of laboratory studies, ordering and review of radiographic studies, pulse oximetry and re-evaluation of patient's condition.   Results for orders placed or performed during the hospital encounter of 25/42/70  Basic metabolic panel  Result Value Ref Range   Sodium 136 135 - 145 mmol/L   Potassium 3.9 3.5 - 5.1 mmol/L   Chloride 101 101 - 111 mmol/L   CO2 26 22 - 32 mmol/L   Glucose, Bld 164 (H) 65 - 99 mg/dL   BUN 10 6 - 20 mg/dL   Creatinine, Ser 0.97 0.61 - 1.24 mg/dL   Calcium 9.6 8.9 - 10.3 mg/dL   GFR calc non Af Amer >60 >60 mL/min   GFR calc Af Amer >60 >60 mL/min   Anion gap 9 5 - 15  CBC  Result Value Ref Range   WBC 15.1 (H) 4.0 - 10.5 K/uL   RBC 5.01 4.22 - 5.81 MIL/uL   Hemoglobin 17.2 (H) 13.0 - 17.0 g/dL   HCT 47.8 39.0 - 52.0 %   MCV 95.4 78.0 - 100.0 fL   MCH 34.3 (H) 26.0 - 34.0 pg   MCHC 36.0 30.0 - 36.0 g/dL   RDW 12.6 11.5 - 15.5 %   Platelets 283 150 - 400 K/uL  Troponin I  Result Value Ref Range   Troponin I <0.03 <0.03 ng/mL  Brain natriuretic peptide  Result Value Ref Range   B Natriuretic Peptide 20.0 0.0 - 100.0  pg/mL  D-dimer, quantitative  Result Value Ref Range   D-Dimer, Quant 0.33 0.00 - 0.50 ug/mL-FEU   Dg Chest 2 View Result Date: 08/23/2016 CLINICAL DATA:  Initial evaluation for acute chest pain, shortness of breath. EXAM: CHEST  2 VIEW COMPARISON:  Prior radiograph from 02/13/2016. FINDINGS: Median sternotomy wires with underlying surgical clips and CABG markers noted, stable. Mild cardiomegaly is unchanged. Mediastinal silhouette within normal limits. Lungs mildly hypoinflated. Mild vascular congestion without overt pulmonary edema. Probable trace right pleural effusion. No focal infiltrates identified. No pneumothorax. No acute osseous abnormality. Scattered degenerative changes noted within the visualized spine. IMPRESSION: 1. Stable cardiomegaly with mild pulmonary vascular congestion without overt pulmonary edema. 2. Trace right pleural effusion. Electronically Signed   By: Jeannine Boga M.D.   On: 08/23/2016 15:39    1825:  On arrival: pt sitting upright, tachypneic, Sats 93 % R/A, lungs diminished with wheezing. IV solumedrol and hour long neb started. After neb: pt appears more comfortable at rest, less tachypneic, Sats 98 % on R/A, lungs continue diminished. Pt ambulated in hallway: pt's O2 Sats dropped to 87 % R/A with pt c/o increasing SOB, with increasing HR and RR. Pt escorted back to stretcher with O2 Sats slowly increasing to 98 % on O2 2L N/C. Pt informed that I recommend observation admission for further evaluation and treatment.  Pt refuses admission.  I encouraged pt to stay, continues to refuse.  Pt makes his own medical decisions.  Risks of AMA explained to pt, including, but not limited to:  Acute respiratory failure, stroke, heart attack, cardiac arrythmia ("irregular heart rate/beat"), "passing out," temporary and/or permanent disability, death.  Pt verb understanding and continues to refuse admission, understanding the consequences of his decision.  I encouraged pt to  follow up with his PMD tomorrow and return to the ED immediately if symptoms worsen, he changes his mind, or for any other concerns.  Pt verb understanding, agreeable.     Final Clinical Impressions(s) / ED Diagnoses   Final diagnoses:  None    New Prescriptions New Prescriptions   No medications on file     Francine Graven, DO 08/25/16 2110

## 2016-08-23 NOTE — Discharge Instructions (Signed)
Take the prescriptions as directed.  Use your albuterol inhaler (2 to 4 puffs) every 4 hours for the next 7 days, then as needed for cough, wheezing, or shortness of breath.  Call your regular medical doctor, your Pulmonologist and your Cardiologist tomorrow morning to schedule a follow up appointment within the next 1 to 2 days.  Return to the Emergency Department immediately sooner if worsening.

## 2016-08-31 ENCOUNTER — Other Ambulatory Visit (HOSPITAL_COMMUNITY): Payer: Self-pay | Admitting: Pulmonary Disease

## 2016-09-01 ENCOUNTER — Other Ambulatory Visit (HOSPITAL_COMMUNITY): Payer: Self-pay | Admitting: Pulmonary Disease

## 2016-09-01 DIAGNOSIS — R911 Solitary pulmonary nodule: Secondary | ICD-10-CM

## 2016-09-02 ENCOUNTER — Other Ambulatory Visit (HOSPITAL_COMMUNITY): Payer: Self-pay | Admitting: Pulmonary Disease

## 2016-09-03 DIAGNOSIS — Z6841 Body Mass Index (BMI) 40.0 and over, adult: Secondary | ICD-10-CM | POA: Diagnosis not present

## 2016-09-03 DIAGNOSIS — G4733 Obstructive sleep apnea (adult) (pediatric): Secondary | ICD-10-CM | POA: Diagnosis not present

## 2016-09-03 DIAGNOSIS — J449 Chronic obstructive pulmonary disease, unspecified: Secondary | ICD-10-CM | POA: Diagnosis not present

## 2016-09-05 DIAGNOSIS — G4733 Obstructive sleep apnea (adult) (pediatric): Secondary | ICD-10-CM | POA: Diagnosis not present

## 2016-09-08 ENCOUNTER — Ambulatory Visit (HOSPITAL_COMMUNITY)
Admission: RE | Admit: 2016-09-08 | Discharge: 2016-09-08 | Disposition: A | Discharge status: Home / Self Care | Payer: Medicare HMO | Source: Ambulatory Visit | Attending: Pulmonary Disease | Admitting: Pulmonary Disease

## 2016-09-08 DIAGNOSIS — I700 Atherosclerosis of aorta: Secondary | ICD-10-CM | POA: Insufficient documentation

## 2016-09-08 DIAGNOSIS — R911 Solitary pulmonary nodule: Secondary | ICD-10-CM | POA: Diagnosis present

## 2016-09-08 DIAGNOSIS — R918 Other nonspecific abnormal finding of lung field: Secondary | ICD-10-CM | POA: Insufficient documentation

## 2016-09-08 DIAGNOSIS — J439 Emphysema, unspecified: Secondary | ICD-10-CM | POA: Insufficient documentation

## 2016-10-04 ENCOUNTER — Ambulatory Visit (INDEPENDENT_AMBULATORY_CARE_PROVIDER_SITE_OTHER): Payer: Medicare HMO | Admitting: Cardiology

## 2016-10-04 ENCOUNTER — Encounter: Payer: Self-pay | Admitting: Cardiology

## 2016-10-04 VITALS — BP 136/82 | HR 93 | Ht 70.0 in | Wt 295.0 lb

## 2016-10-04 DIAGNOSIS — J441 Chronic obstructive pulmonary disease with (acute) exacerbation: Secondary | ICD-10-CM

## 2016-10-04 DIAGNOSIS — I480 Paroxysmal atrial fibrillation: Secondary | ICD-10-CM | POA: Diagnosis not present

## 2016-10-04 DIAGNOSIS — E782 Mixed hyperlipidemia: Secondary | ICD-10-CM | POA: Diagnosis not present

## 2016-10-04 DIAGNOSIS — I25708 Atherosclerosis of coronary artery bypass graft(s), unspecified, with other forms of angina pectoris: Secondary | ICD-10-CM | POA: Diagnosis not present

## 2016-10-04 DIAGNOSIS — I10 Essential (primary) hypertension: Secondary | ICD-10-CM | POA: Diagnosis not present

## 2016-10-04 MED ORDER — AMLODIPINE BESYLATE 5 MG PO TABS: 5.0000 mg | ORAL_TABLET | Freq: Every day | ORAL | 3 refills | Status: DC

## 2016-10-04 MED ORDER — AZITHROMYCIN 250 MG PO TABS: ORAL_TABLET | ORAL | 0 refills | Status: DC

## 2016-10-04 MED ORDER — PREDNISONE 20 MG PO TABS: 40.0000 mg | ORAL_TABLET | Freq: Every day | ORAL | 0 refills | Status: DC

## 2016-10-04 NOTE — Progress Notes (Signed)
Clinical Summary [name] is a 55 y.o.male seen today for follow up of the following medical problems.   1. CAD - previous CABG and PCI, chronic angina secondary to CTO of RCA and occluded SVG-RCA graft. From previous notes significant small vessel disease as well, no great revasc options.  - reports 1-2 episodes of chest pain. Cramping like feeling left sided, down into left arm. Occurred at rest. 3/10 in severity. Felt sweaty, felt SOB. Took NG x2, resolved. Occurred about 1 week ago. Similar to his chronic pain. - compliant with meds.   2. HTN - does not check at home - compliant with meds  3. COPD - cold like symptoms x 1 week. +cough that productive yellowish. +wheezing, +SOB.  - seen 08/23/16 in ER with COPD exacerbation.  - followed by pcp   4. PAF - no recent palpitations.  - compliant with eliquis.   5. Hyperlipidemia - compliant with statin  6. DM2 - followed by pcp  7. OSA - on cpap  Past Medical History:  Diagnosis Date  . Abnormal myocardial perfusion study 01/01/2011   there a small to moderate sized inferobasal scar  . Barrett's esophagus   . CAD (coronary artery disease) 2008   bypass grafting   . Cataracts, bilateral   . Chronic kidney disease    hx of kidney stones  . Claudication ([place]) 11/16/2011   PV test perform shows normal  . COPD (chronic obstructive pulmonary disease) ([place])   . Depression   . Diabetes ([place])    type 2 diabetes mellitus  . Dysrhythmia   . GERD (gastroesophageal reflux disease)   . Glaucoma   . Hernia of abdominal wall   . HTN (hypertension)   . Hyperlipidemia   . Morbid obesity ([place])   . Myocardial infarction 2008,2009,2009  . OSA (obstructive sleep apnea)    on cpap  . PAF (paroxysmal atrial fibrillation) ([place])   . S/P colonoscopy 2009   3-4 mm transverse colon erosions likely secondary to  ASA  . S/P endoscopy Dec 2011   moderate erosive gastritis, Barrett's esophagus 1-2cm  . Sleep apnea   . SOB  (shortness of breath) 11/03/2007   2D Echo EF 50%-55%  . Stroke [facility])      Allergies  Allergen Reactions  . Contrast Media [Iodinated Diagnostic Agents] Other (See Comments)    Pt must be premedicated before given contrast media - stops heart  . Iohexol Other (See Comments)     Consult with radiologist before pre meds are given.Desc: PT. STATES HEART STOPPED HAS TO BE PREMED.      Current Outpatient Prescriptions  Medication Sig Dispense Refill  . albuterol (PROVENTIL HFA;VENTOLIN HFA) 108 (90 Base) MCG/ACT inhaler Inhale 2 puffs into the lungs every 4 (four) hours as needed for wheezing or shortness of breath. 1 Inhaler 0  . amLODipine (NORVASC) 2.5 MG tablet Take 1 tablet (2.5 mg total) by mouth daily. 30 tablet 0  . apixaban (ELIQUIS) 5 MG TABS tablet Take 1 tablet (5 mg total) by mouth 2 (two) times daily. 1 tablet 0  . Choline Fenofibrate (TRILIPIX) 135 MG capsule Take 135 mg by mouth at bedtime.     . diazepam (VALIUM) 5 MG tablet Take 5 mg by mouth 2 (two) times daily.    Marland Kitchen escitalopram (LEXAPRO) 10 MG tablet Take 10 mg by mouth daily.    . furosemide (LASIX) 80 MG tablet Take 80 mg by mouth daily.  4  .  HYDROcodone-acetaminophen (NORCO) 5-325 MG per tablet Take 1-2 tablets by mouth every 4 (four) hours as needed for moderate pain. 60 tablet 0  . isosorbide mononitrate (IMDUR) 60 MG 24 hr tablet Take 1 tablet (60 mg total) by mouth 3 (three) times daily. Please make an appointment for additional refills (Patient taking differently: Take 60 mg by mouth 3 (three) times daily. ) 45 tablet 0  . lisinopril (PRINIVIL,ZESTRIL) 5 MG tablet Take 0.5 tablets (2.5 mg total) by mouth daily.    . metoprolol (LOPRESSOR) 100 MG tablet Take 100 mg by mouth 2 (two) times daily.     . modafinil (PROVIGIL) 100 MG tablet Take 100 mg by mouth 2 (two) times daily as needed (shortness of breath).     . nitroGLYCERIN (NITROLINGUAL) 0.4 MG/SPRAY spray Place 1 spray under the tongue every 5 (five) minutes  x 3 doses as needed for chest pain.    . pantoprazole (PROTONIX) 40 MG tablet TAKE 1 TABLET BY MOUTH 30 MINUTES BEFORE BREAKFAST 90 tablet 3  . potassium chloride SA (K-DUR,KLOR-CON) 20 MEQ tablet Take 1 tablet (20 mEq total) by mouth 2 (two) times daily. 30 tablet 0  . predniSONE (DELTASONE) 20 MG tablet Take 2 tablets (40 mg total) by mouth daily. 10 tablet 0  . RANEXA 500 MG 12 hr tablet Take 500 mg by mouth 2 (two) times daily.    . rosuvastatin (CRESTOR) 20 MG tablet Take 20 mg by mouth daily.    . sitaGLIPtin (JANUVIA) 100 MG tablet Take 100 mg by mouth daily.      No current facility-administered medications for this visit.      Past Surgical History:  Procedure Laterality Date  . BIOPSY N/A 04/29/2015   Procedure: BIOPSY;  Surgeon: Danie Binder, MD;  Location: [facility];  Service: Endoscopy;  Laterality: N/A;  . CABG X 4  03/2008  . CARDIAC CATHETERIZATION  2009   stent placement to the left circumflex a 2.25   . CARDIAC CATHETERIZATION  07/08/2010  . CORONARY ARTERY BYPASS GRAFT  2008   4 vessels  . CORONARY STENT PLACEMENT  12/29/12  . CORONARY STENT PLACEMENT  12/2012  . ESOPHAGEAL DILATION N/A 04/29/2015   Procedure: ESOPHAGEAL DILATION 15 mm, 16 mm;  Surgeon: Danie Binder, MD;  Location: [facility];  Service: Endoscopy;  Laterality: N/A;  . ESOPHAGOGASTRODUODENOSCOPY  09/2011   Barrett's esophagus, no dysplasia on biopsies. Distal esophagitis. Status post dilation. Moderate gastritis and duodenitis, but biopsies benign. Next EGD in November 2015 for surveillance of Barrett's esophagus.  . ESOPHAGOGASTRODUODENOSCOPY (EGD) WITH PROPOFOL N/A 04/29/2015   SLF: 1. Barretts esophagus 2. Moderate non-erosive gastritis.   Marland Kitchen HERNIA REPAIR     ventral hernia repair  . LEFT HEART CATHETERIZATION WITH CORONARY/GRAFT ANGIOGRAM N/A 12/29/2012   Procedure: LEFT HEART CATHETERIZATION WITH Beatrix Fetters;  Surgeon: Troy Sine, MD;  Location: [facility] CATH LAB;  Service: Cardiovascular;   Laterality: N/A;  . ORIF FIBULA FRACTURE Right 06/03/2015   Procedure: OPEN REDUCTION INTERNAL FIXATION (ORIF) DISTAL FIBULA  FRACTURE;  Surgeon: Garald Balding, MD;  Location: [facility];  Service: Orthopedics;  Laterality: Right;  . PERCUTANEOUS CORONARY STENT INTERVENTION (PCI-S)  12/29/2012   Procedure: PERCUTANEOUS CORONARY STENT INTERVENTION (PCI-S);  Surgeon: Troy Sine, MD;  Location: [facility] CATH LAB;  Service: Cardiovascular;;  . SAVORY DILATION  09/06/2011   Procedure: SAVORY DILATION;  Surgeon: Dorothyann Peng, MD;  Location: [facility];  Service: Endoscopy;  Laterality: N/A;  Dilated with  17m     Allergies  Allergen Reactions  . Contrast Media [Iodinated Diagnostic Agents] Other (See Comments)    Pt must be premedicated before given contrast media - stops heart  . Iohexol Other (See Comments)     Consult with radiologist before pre meds are given.Desc: PT. STATES HEART STOPPED HAS TO BE PREMED.       Family History  Problem Relation Age of Onset  . Diabetes Mother   . Heart disease Mother   . Diabetes Father   . Heart disease Father   . Colon cancer Neg Hx   . Anesthesia problems Neg Hx   . Hypotension Neg Hx   . Malignant hyperthermia Neg Hx   . Pseudochol deficiency Neg Hx      Social History Mr. FChestnutreports that he has been smoking Cigarettes.  He has a 15.00 pack-year smoking history. He has never used smokeless tobacco. Mr. FBlessreports that he drinks alcohol.   Review of Systems CONSTITUTIONAL: No weight loss, fever, chills, weakness or fatigue.  HEENT: Eyes: No visual loss, blurred vision, double vision or yellow sclerae.No hearing loss, sneezing, congestion, runny nose or sore throat.  SKIN: No rash or itching.  CARDIOVASCULAR: per hpi RESPIRATORY: No shortness of breath, cough or sputum.  GASTROINTESTINAL: No anorexia, nausea, vomiting or diarrhea. No abdominal pain or blood.  GENITOURINARY: No burning on urination, no polyuria NEUROLOGICAL: No  headache, dizziness, syncope, paralysis, ataxia, numbness or tingling in the extremities. No change in bowel or bladder control.  MUSCULOSKELETAL: No muscle, back pain, joint pain or stiffness.  LYMPHATICS: No enlarged nodes. No history of splenectomy.  PSYCHIATRIC: No history of depression or anxiety.  ENDOCRINOLOGIC: No reports of sweating, cold or heat intolerance. No polyuria or polydipsia.  .Marland Kitchen  Physical Examination Vitals:   10/04/16 0856  BP: 136/82  Pulse: 93   Vitals:   10/04/16 0856  Weight: 295 lb (133.8 kg)  Height: '5\' 10"'$  (1.778 m)    Gen: resting comfortably, no acute distress HEENT: no scleral icterus, pupils equal round and reactive, no palptable cervical adenopathy,  CV: RRR, no m/r/g, no jvd Resp: Clear to auscultation bilaterally GI: abdomen is soft, non-tender, non-distended, normal bowel sounds, no hepatosplenomegaly MSK: extremities are warm, no edema.  Skin: warm, no rash Neuro:  no focal deficits Psych: appropriate affect     Assessment and Plan  1. CAD - occasional chest pain symptoms, consistent with his chronic intermittent symptoms.  - from last cath no great revasc options. - we will increase norvasc to '5mg'$  daily for additional antianginal effects  2. COPD/COPD exacerbation - signiciant productive cough, active wheezing - will give 5 days of prednisone '40mg'$  daily, azithomycin 5 day course  3. PAF - no recent symptoms - CHADS2Vasc score is 3, continue eliquis  4. HTN - at goal, continue current meds  5. Hyperlipidemia - continue statin. Repeat lipid panel next visit if not done by pcp   F/u 3 months     JArnoldo Lenis M.D.,

## 2016-10-04 NOTE — Patient Instructions (Signed)
Your physician recommends that you schedule a follow-up appointment in: 3 Months Dr. Harl Bowie  Your physician has recommended you make the following change in your medication:   Start Prednisone 40 mg Daily  Start Zithromax 2 Tablets on day one and 1 tablet on days 2-5  Increase Norvasc to 5 mg Daily   If you need a refill on your cardiac medications before your next appointment, please call your pharmacy.  Thank you for choosing [facility]!

## 2016-10-05 DIAGNOSIS — G4733 Obstructive sleep apnea (adult) (pediatric): Secondary | ICD-10-CM | POA: Diagnosis not present

## 2016-10-14 DIAGNOSIS — G4733 Obstructive sleep apnea (adult) (pediatric): Secondary | ICD-10-CM | POA: Diagnosis not present

## 2016-10-29 DIAGNOSIS — I25119 Atherosclerotic heart disease of native coronary artery with unspecified angina pectoris: Secondary | ICD-10-CM | POA: Diagnosis not present

## 2016-10-29 DIAGNOSIS — G4733 Obstructive sleep apnea (adult) (pediatric): Secondary | ICD-10-CM | POA: Diagnosis not present

## 2016-10-29 DIAGNOSIS — I10 Essential (primary) hypertension: Secondary | ICD-10-CM | POA: Diagnosis not present

## 2016-10-29 DIAGNOSIS — J449 Chronic obstructive pulmonary disease, unspecified: Secondary | ICD-10-CM | POA: Diagnosis not present

## 2016-11-05 DIAGNOSIS — G4733 Obstructive sleep apnea (adult) (pediatric): Secondary | ICD-10-CM | POA: Diagnosis not present

## 2016-11-18 DIAGNOSIS — J441 Chronic obstructive pulmonary disease with (acute) exacerbation: Secondary | ICD-10-CM | POA: Diagnosis not present

## 2016-11-18 DIAGNOSIS — I6789 Other cerebrovascular disease: Secondary | ICD-10-CM | POA: Diagnosis not present

## 2016-11-18 DIAGNOSIS — R0902 Hypoxemia: Secondary | ICD-10-CM | POA: Diagnosis not present

## 2016-11-18 DIAGNOSIS — R5381 Other malaise: Secondary | ICD-10-CM | POA: Diagnosis not present

## 2016-11-18 DIAGNOSIS — G4710 Hypersomnia, unspecified: Secondary | ICD-10-CM | POA: Diagnosis not present

## 2016-11-18 DIAGNOSIS — G4733 Obstructive sleep apnea (adult) (pediatric): Secondary | ICD-10-CM | POA: Diagnosis not present

## 2016-11-18 DIAGNOSIS — R413 Other amnesia: Secondary | ICD-10-CM | POA: Diagnosis not present

## 2016-11-18 DIAGNOSIS — I2510 Atherosclerotic heart disease of native coronary artery without angina pectoris: Secondary | ICD-10-CM | POA: Diagnosis not present

## 2016-11-22 ENCOUNTER — Ambulatory Visit (HOSPITAL_COMMUNITY)
Admission: RE | Admit: 2016-11-22 | Discharge: 2016-11-22 | Disposition: A | Discharge status: Home / Self Care | Payer: Medicare HMO | Source: Ambulatory Visit | Attending: Family Medicine | Admitting: Family Medicine

## 2016-11-22 ENCOUNTER — Other Ambulatory Visit (HOSPITAL_COMMUNITY): Payer: Self-pay | Admitting: Family Medicine

## 2016-11-22 DIAGNOSIS — M25532 Pain in left wrist: Secondary | ICD-10-CM | POA: Diagnosis not present

## 2016-11-22 DIAGNOSIS — Z6841 Body Mass Index (BMI) 40.0 and over, adult: Secondary | ICD-10-CM | POA: Diagnosis not present

## 2016-11-22 DIAGNOSIS — J449 Chronic obstructive pulmonary disease, unspecified: Secondary | ICD-10-CM | POA: Diagnosis not present

## 2016-11-22 DIAGNOSIS — M109 Gout, unspecified: Secondary | ICD-10-CM | POA: Diagnosis not present

## 2016-11-22 DIAGNOSIS — I2510 Atherosclerotic heart disease of native coronary artery without angina pectoris: Secondary | ICD-10-CM | POA: Diagnosis not present

## 2016-11-22 DIAGNOSIS — Z1389 Encounter for screening for other disorder: Secondary | ICD-10-CM | POA: Diagnosis not present

## 2016-11-22 DIAGNOSIS — E1165 Type 2 diabetes mellitus with hyperglycemia: Secondary | ICD-10-CM | POA: Insufficient documentation

## 2016-11-22 DIAGNOSIS — G4733 Obstructive sleep apnea (adult) (pediatric): Secondary | ICD-10-CM | POA: Diagnosis not present

## 2016-11-22 DIAGNOSIS — I4891 Unspecified atrial fibrillation: Secondary | ICD-10-CM | POA: Diagnosis not present

## 2016-11-22 DIAGNOSIS — E1151 Type 2 diabetes mellitus with diabetic peripheral angiopathy without gangrene: Secondary | ICD-10-CM | POA: Diagnosis not present

## 2016-12-06 DIAGNOSIS — G4733 Obstructive sleep apnea (adult) (pediatric): Secondary | ICD-10-CM | POA: Diagnosis not present

## 2016-12-14 ENCOUNTER — Other Ambulatory Visit (HOSPITAL_COMMUNITY): Payer: Self-pay | Admitting: Pulmonary Disease

## 2016-12-14 DIAGNOSIS — J984 Other disorders of lung: Secondary | ICD-10-CM

## 2016-12-16 ENCOUNTER — Other Ambulatory Visit (HOSPITAL_COMMUNITY): Payer: Self-pay | Admitting: Pulmonary Disease

## 2016-12-16 DIAGNOSIS — R911 Solitary pulmonary nodule: Secondary | ICD-10-CM

## 2016-12-17 ENCOUNTER — Ambulatory Visit (HOSPITAL_COMMUNITY)
Admission: RE | Admit: 2016-12-17 | Discharge: 2016-12-17 | Disposition: A | Discharge status: Home / Self Care | Payer: Medicare HMO | Source: Ambulatory Visit | Attending: Pulmonary Disease | Admitting: Pulmonary Disease

## 2016-12-17 DIAGNOSIS — I2510 Atherosclerotic heart disease of native coronary artery without angina pectoris: Secondary | ICD-10-CM | POA: Insufficient documentation

## 2016-12-17 DIAGNOSIS — Z951 Presence of aortocoronary bypass graft: Secondary | ICD-10-CM | POA: Diagnosis not present

## 2016-12-17 DIAGNOSIS — R911 Solitary pulmonary nodule: Secondary | ICD-10-CM

## 2016-12-17 DIAGNOSIS — K760 Fatty (change of) liver, not elsewhere classified: Secondary | ICD-10-CM | POA: Insufficient documentation

## 2016-12-17 DIAGNOSIS — I700 Atherosclerosis of aorta: Secondary | ICD-10-CM | POA: Diagnosis not present

## 2016-12-17 DIAGNOSIS — R918 Other nonspecific abnormal finding of lung field: Secondary | ICD-10-CM | POA: Insufficient documentation

## 2016-12-21 DIAGNOSIS — Z959 Presence of cardiac and vascular implant and graft, unspecified: Secondary | ICD-10-CM | POA: Diagnosis not present

## 2016-12-21 DIAGNOSIS — Z7984 Long term (current) use of oral hypoglycemic drugs: Secondary | ICD-10-CM | POA: Diagnosis not present

## 2016-12-21 DIAGNOSIS — Z9181 History of falling: Secondary | ICD-10-CM | POA: Diagnosis not present

## 2016-12-21 DIAGNOSIS — I110 Hypertensive heart disease with heart failure: Secondary | ICD-10-CM | POA: Diagnosis not present

## 2016-12-21 DIAGNOSIS — E785 Hyperlipidemia, unspecified: Secondary | ICD-10-CM | POA: Diagnosis not present

## 2016-12-21 DIAGNOSIS — Z0000 Encounter for general adult medical examination without abnormal findings: Secondary | ICD-10-CM | POA: Diagnosis not present

## 2016-12-21 DIAGNOSIS — K219 Gastro-esophageal reflux disease without esophagitis: Secondary | ICD-10-CM | POA: Diagnosis not present

## 2016-12-21 DIAGNOSIS — K08409 Partial loss of teeth, unspecified cause, unspecified class: Secondary | ICD-10-CM | POA: Diagnosis not present

## 2016-12-21 DIAGNOSIS — Z6841 Body Mass Index (BMI) 40.0 and over, adult: Secondary | ICD-10-CM | POA: Diagnosis not present

## 2016-12-21 DIAGNOSIS — Z599 Problem related to housing and economic circumstances, unspecified: Secondary | ICD-10-CM | POA: Diagnosis not present

## 2016-12-21 DIAGNOSIS — Z7901 Long term (current) use of anticoagulants: Secondary | ICD-10-CM | POA: Diagnosis not present

## 2016-12-21 DIAGNOSIS — E119 Type 2 diabetes mellitus without complications: Secondary | ICD-10-CM | POA: Diagnosis not present

## 2016-12-21 DIAGNOSIS — I252 Old myocardial infarction: Secondary | ICD-10-CM | POA: Diagnosis not present

## 2016-12-21 DIAGNOSIS — E876 Hypokalemia: Secondary | ICD-10-CM | POA: Diagnosis not present

## 2016-12-21 DIAGNOSIS — Z9989 Dependence on other enabling machines and devices: Secondary | ICD-10-CM | POA: Diagnosis not present

## 2016-12-21 DIAGNOSIS — R69 Illness, unspecified: Secondary | ICD-10-CM | POA: Diagnosis not present

## 2016-12-21 DIAGNOSIS — G4730 Sleep apnea, unspecified: Secondary | ICD-10-CM | POA: Diagnosis not present

## 2016-12-27 DIAGNOSIS — J449 Chronic obstructive pulmonary disease, unspecified: Secondary | ICD-10-CM | POA: Diagnosis not present

## 2016-12-27 DIAGNOSIS — J984 Other disorders of lung: Secondary | ICD-10-CM | POA: Diagnosis not present

## 2016-12-27 DIAGNOSIS — J9611 Chronic respiratory failure with hypoxia: Secondary | ICD-10-CM | POA: Diagnosis not present

## 2016-12-27 DIAGNOSIS — G4733 Obstructive sleep apnea (adult) (pediatric): Secondary | ICD-10-CM | POA: Diagnosis not present

## 2016-12-30 ENCOUNTER — Ambulatory Visit (INDEPENDENT_AMBULATORY_CARE_PROVIDER_SITE_OTHER): Payer: Medicare HMO | Admitting: Otolaryngology

## 2016-12-30 DIAGNOSIS — R070 Pain in throat: Secondary | ICD-10-CM

## 2016-12-30 DIAGNOSIS — R69 Illness, unspecified: Secondary | ICD-10-CM | POA: Diagnosis not present

## 2016-12-30 DIAGNOSIS — K219 Gastro-esophageal reflux disease without esophagitis: Secondary | ICD-10-CM

## 2016-12-30 DIAGNOSIS — F17210 Nicotine dependence, cigarettes, uncomplicated: Secondary | ICD-10-CM

## 2017-01-03 DIAGNOSIS — G4733 Obstructive sleep apnea (adult) (pediatric): Secondary | ICD-10-CM | POA: Diagnosis not present

## 2017-01-11 ENCOUNTER — Encounter: Payer: Self-pay | Admitting: Cardiology

## 2017-01-11 ENCOUNTER — Ambulatory Visit (INDEPENDENT_AMBULATORY_CARE_PROVIDER_SITE_OTHER): Payer: Medicare HMO | Admitting: Cardiology

## 2017-01-11 VITALS — BP 130/80 | HR 78 | Ht 71.0 in | Wt 296.0 lb

## 2017-01-11 DIAGNOSIS — I25708 Atherosclerosis of coronary artery bypass graft(s), unspecified, with other forms of angina pectoris: Secondary | ICD-10-CM | POA: Diagnosis not present

## 2017-01-11 DIAGNOSIS — I10 Essential (primary) hypertension: Secondary | ICD-10-CM

## 2017-01-11 DIAGNOSIS — Z79899 Other long term (current) drug therapy: Secondary | ICD-10-CM

## 2017-01-11 DIAGNOSIS — E782 Mixed hyperlipidemia: Secondary | ICD-10-CM

## 2017-01-11 DIAGNOSIS — I480 Paroxysmal atrial fibrillation: Secondary | ICD-10-CM | POA: Diagnosis not present

## 2017-01-11 MED ORDER — AMLODIPINE BESYLATE 10 MG PO TABS: 10.0000 mg | ORAL_TABLET | Freq: Every day | ORAL | 3 refills | Status: DC

## 2017-01-11 NOTE — Patient Instructions (Signed)
Medication Instructions:  STOP RANEXA INCREASE NORVASC TO 10 MG DAILY   Labwork: NONE  Testing/Procedures: NONE  Follow-Up: Your physician recommends that you schedule a follow-up appointment in: 1 MONTH   Any Other Special Instructions Will Be Listed Below (If Applicable).     If you need a refill on your cardiac medications before your next appointment, please call your pharmacy.

## 2017-01-11 NOTE — Progress Notes (Signed)
Clinical Summary [name] is a 56 y.o.male male seen today for follow up of the following medical problems.   1. CAD - previous CABG and PCI, chronic angina secondary to CTO of RCA and occluded SVG-RCA graft. From previous notes significant small vessel disease as well, no great revasc options for the RCA territory.     - still with chest pain. Feeling of heaviness on chest, +SOB. Can occur at rest or with exertion, but worst with exertion. 6/10 in severity. Can uses NG with help at times. Occurs 1-2 times a month. - similar to previous pain. Overall stable in severity and frequency - mixed compliance with meds. Difficulty affording meds. Not taking: fenfibrate, ranexa, eliquis, lasix   2. HTN -he remains compliant with meds   3. PAF - no recent palpitations.  - difficultly affording eliquis, has not been taking regularly.   4. Hyperlipidemia - he remains compliant with statin  5. DM2 - followed by pcp  6. OSA - on cpap at home   Past Medical History:  Diagnosis Date  . Abnormal myocardial perfusion study 01/01/2011   there a small to moderate sized inferobasal scar  . Barrett's esophagus   . CAD (coronary artery disease) 2008   bypass grafting   . Cataracts, bilateral   . Chronic kidney disease    hx of kidney stones  . Claudication ([place]) 11/16/2011   PV test perform shows normal  . COPD (chronic obstructive pulmonary disease) ([place])   . Depression   . Diabetes ([place])    type 2 diabetes mellitus  . Dysrhythmia   . GERD (gastroesophageal reflux disease)   . Glaucoma   . Hernia of abdominal wall   . HTN (hypertension)   . Hyperlipidemia   . Morbid obesity ([place])   . Myocardial infarction 2008,2009,2009  . OSA (obstructive sleep apnea)    on cpap  . PAF (paroxysmal atrial fibrillation) ([place])   . S/P colonoscopy 2009   3-4 mm transverse colon erosions likely secondary to  ASA  . S/P endoscopy Dec 2011   moderate erosive gastritis, Barrett's  esophagus 1-2cm  . Sleep apnea   . SOB (shortness of breath) 11/03/2007   2D Echo EF 50%-55%  . Stroke [facility])      Allergies  Allergen Reactions  . Contrast Media [Iodinated Diagnostic Agents] Other (See Comments)    Pt must be premedicated before given contrast media - stops heart  . Iohexol Other (See Comments)     Consult with radiologist before pre meds are given.Desc: PT. STATES HEART STOPPED HAS TO BE PREMED.      Current Outpatient Prescriptions  Medication Sig Dispense Refill  . albuterol (PROVENTIL HFA;VENTOLIN HFA) 108 (90 Base) MCG/ACT inhaler Inhale 2 puffs into the lungs every 4 (four) hours as needed for wheezing or shortness of breath. 1 Inhaler 0  . amLODipine (NORVASC) 5 MG tablet Take 1 tablet (5 mg total) by mouth daily. 90 tablet 3  . apixaban (ELIQUIS) 5 MG TABS tablet Take 1 tablet (5 mg total) by mouth 2 (two) times daily. 1 tablet 0  . azithromycin (ZITHROMAX) 250 MG tablet Take 2 Tablets ('500mg'$ ) on Day one. Then Take One Tablet ( 250 mg) on Days 2-5. 6 each 0  . Choline Fenofibrate (TRILIPIX) 135 MG capsule Take 135 mg by mouth at bedtime.     . diazepam (VALIUM) 5 MG tablet Take 5 mg by mouth 2 (two) times daily.    Marland Kitchen escitalopram (LEXAPRO)  10 MG tablet Take 10 mg by mouth daily.    . furosemide (LASIX) 80 MG tablet Take 80 mg by mouth daily.  4  . HYDROcodone-acetaminophen (NORCO) 5-325 MG per tablet Take 1-2 tablets by mouth every 4 (four) hours as needed for moderate pain. 60 tablet 0  . isosorbide mononitrate (IMDUR) 60 MG 24 hr tablet Take 1 tablet (60 mg total) by mouth 3 (three) times daily. Please make an appointment for additional refills (Patient taking differently: Take 60 mg by mouth 3 (three) times daily. ) 45 tablet 0  . lisinopril (PRINIVIL,ZESTRIL) 5 MG tablet Take 0.5 tablets (2.5 mg total) by mouth daily.    . metoprolol (LOPRESSOR) 100 MG tablet Take 100 mg by mouth 2 (two) times daily.     . modafinil (PROVIGIL) 100 MG tablet Take 100 mg by  mouth 2 (two) times daily as needed (shortness of breath).     . nitroGLYCERIN (NITROLINGUAL) 0.4 MG/SPRAY spray Place 1 spray under the tongue every 5 (five) minutes x 3 doses as needed for chest pain.    . pantoprazole (PROTONIX) 40 MG tablet TAKE 1 TABLET BY MOUTH 30 MINUTES BEFORE BREAKFAST 90 tablet 3  . potassium chloride SA (K-DUR,KLOR-CON) 20 MEQ tablet Take 1 tablet (20 mEq total) by mouth 2 (two) times daily. 30 tablet 0  . predniSONE (DELTASONE) 20 MG tablet Take 2 tablets (40 mg total) by mouth daily with breakfast. 10 tablet 0  . RANEXA 500 MG 12 hr tablet Take 500 mg by mouth 2 (two) times daily.    . rosuvastatin (CRESTOR) 20 MG tablet Take 20 mg by mouth daily.    . sitaGLIPtin (JANUVIA) 100 MG tablet Take 100 mg by mouth daily.      No current facility-administered medications for this visit.      Past Surgical History:  Procedure Laterality Date  . BIOPSY N/A 04/29/2015   Procedure: BIOPSY;  Surgeon: Danie Binder, MD;  Location: [facility];  Service: Endoscopy;  Laterality: N/A;  . CABG X 4  03/2008  . CARDIAC CATHETERIZATION  2009   stent placement to the left circumflex a 2.25   . CARDIAC CATHETERIZATION  07/08/2010  . CORONARY ARTERY BYPASS GRAFT  2008   4 vessels  . CORONARY STENT PLACEMENT  12/29/12  . CORONARY STENT PLACEMENT  12/2012  . ESOPHAGEAL DILATION N/A 04/29/2015   Procedure: ESOPHAGEAL DILATION 15 mm, 16 mm;  Surgeon: Danie Binder, MD;  Location: [facility];  Service: Endoscopy;  Laterality: N/A;  . ESOPHAGOGASTRODUODENOSCOPY  09/2011   Barrett's esophagus, no dysplasia on biopsies. Distal esophagitis. Status post dilation. Moderate gastritis and duodenitis, but biopsies benign. Next EGD in November 2015 for surveillance of Barrett's esophagus.  . ESOPHAGOGASTRODUODENOSCOPY (EGD) WITH PROPOFOL N/A 04/29/2015   SLF: 1. Barretts esophagus 2. Moderate non-erosive gastritis.   Marland Kitchen HERNIA REPAIR     ventral hernia repair  . LEFT HEART CATHETERIZATION WITH  CORONARY/GRAFT ANGIOGRAM N/A 12/29/2012   Procedure: LEFT HEART CATHETERIZATION WITH Beatrix Fetters;  Surgeon: Troy Sine, MD;  Location: [facility] CATH LAB;  Service: Cardiovascular;  Laterality: N/A;  . ORIF FIBULA FRACTURE Right 06/03/2015   Procedure: OPEN REDUCTION INTERNAL FIXATION (ORIF) DISTAL FIBULA  FRACTURE;  Surgeon: Garald Balding, MD;  Location: [facility];  Service: Orthopedics;  Laterality: Right;  . PERCUTANEOUS CORONARY STENT INTERVENTION (PCI-S)  12/29/2012   Procedure: PERCUTANEOUS CORONARY STENT INTERVENTION (PCI-S);  Surgeon: Troy Sine, MD;  Location: [facility] CATH LAB;  Service: Cardiovascular;;  .  SAVORY DILATION  09/06/2011   Procedure: SAVORY DILATION;  Surgeon: Dorothyann Peng, MD;  Location: [facility];  Service: Endoscopy;  Laterality: N/A;  Dilated with 25m     Allergies  Allergen Reactions  . Contrast Media [Iodinated Diagnostic Agents] Other (See Comments)    Pt must be premedicated before given contrast media - stops heart  . Iohexol Other (See Comments)     Consult with radiologist before pre meds are given.Desc: PT. STATES HEART STOPPED HAS TO BE PREMED.       Family History  Problem Relation Age of Onset  . Diabetes Mother   . Heart disease Mother   . Diabetes Father   . Heart disease Father   . Colon cancer Neg Hx   . Anesthesia problems Neg Hx   . Hypotension Neg Hx   . Malignant hyperthermia Neg Hx   . Pseudochol deficiency Neg Hx      Social History Mr. FDannreports that he has been smoking Cigarettes.  He has a 15.00 pack-year smoking history. He has never used smokeless tobacco. Mr. FDurfeereports that he drinks alcohol.   Review of Systems CONSTITUTIONAL: No weight loss, fever, chills, weakness or fatigue.  HEENT: Eyes: No visual loss, blurred vision, double vision or yellow sclerae.No hearing loss, sneezing, congestion, runny nose or sore throat.  SKIN: No rash or itching.  CARDIOVASCULAR: per hpi RESPIRATORY: No shortness of  breath, cough or sputum.  GASTROINTESTINAL: No anorexia, nausea, vomiting or diarrhea. No abdominal pain or blood.  GENITOURINARY: No burning on urination, no polyuria NEUROLOGICAL: No headache, dizziness, syncope, paralysis, ataxia, numbness or tingling in the extremities. No change in bowel or bladder control.  MUSCULOSKELETAL: No muscle, back pain, joint pain or stiffness.  LYMPHATICS: No enlarged nodes. No history of splenectomy.  PSYCHIATRIC: No history of depression or anxiety.  ENDOCRINOLOGIC: No reports of sweating, cold or heat intolerance. No polyuria or polydipsia.  .Marland Kitchen  Physical Examination Vitals:   01/11/17 0958  BP: 130/80  Pulse: 78   Vitals:   01/11/17 0958  Weight: 296 lb (134.3 kg)  Height: '5\' 11"'$  (1.803 m)    Gen: resting comfortably, no acute distress HEENT: no scleral icterus, pupils equal round and reactive, no palptable cervical adenopathy,  CV: RRR, no m/r/g, no jvd Resp: Clear to auscultation bilaterally GI: abdomen is soft, non-tender, non-distended, normal bowel sounds, no hepatosplenomegaly MSK: extremities are warm, no edema.  Skin: warm, no rash Neuro:  no focal deficits Psych: appropriate affect   Diagnostic Studies 12/2012 cath  IMPRESSION: 1. Normal-to-mild left ventricular dysfunction with mid inferior wall     hypo-to-akinesis and inferolateral hypo-to-akinesis with mild upper     posterolateral hypocontractility with an ejection fraction     approximately 50%. 2. Severe native coronary artery disease with 30-40% eccentric     narrowing in the mid left main coronary artery; 50% proximal left     anterior descending artery stenosis; total occlusion of the     proximal ramus intermedius vessel; progressive 95% ostial stenosis     in the circumflex coronary artery, proximal to a previously placed     proximal circumflex OM stent and total occlusion of the proximal-to-     mid right coronary artery. 3. Old occlusion of the vein graft,  which had supplied the right     coronary artery. 4. Patent left internal mammary artery graft to the left anterior     descending artery. 5. Patent vein graft to the  ramus intermedius vessel. 6. Patent vein graft to the distal circumflex vessel. 7. Successful complex percutaneous coronary intervention involving the     ostium and proximal portion of the left circumflex coronary artery     with cutting balloon arthrotomy and ultimate stenting with a 2.25 x     12-mm DES Xience Xpedition stent postdilated to 2.4 mm with the 95%     ostial stenosis being reduced to 0%. 8. Bivalirudin/IC nitroglycerin/the patient is on Effient in addition     to aspirin therapy.  12/2012 nuclear stress Impression Exercise Capacity:  Lexiscan with no exercise. BP Response:  Hypotensive blood pressure response. Clinical Symptoms:  Mild chest pain/dyspnea. ECG Impression:  No significant ECG changes with Lexiscan. Comparison with Prior Nuclear Study: *There appears to be persistent inferior infarct with superimposed apical and apical lateral reversible ischemia which is new compared to a study in 12/2010.  Overall Impression:  Intermediate stress nuclear study. Small to moderate-sized area of reversible ischemia with fixed inferior scar.  LV Wall Motion:  LVEF 52%. Inferior hypokinesis.    Assessment and Plan  1. CAD - chest pain at times, he has had mixed compliance with medications - difficulty affording ranexa, we will d/c. Increase norvasc to '10mg'$  as additional antianginal.    2. PAF - no recent symptoms - CHADS2Vasc score is 3, he is on eliquis. Given samples today. He reports obtaining new insurance and will let us know if continued issuess with affording eliquis.   3. HTN -his bp is  at goal, he will continue current meds  4. Hyperlipidemia - continue statin. We will request labs from pcp  5. Morbid obesity - BMI is 41 - counseled on weight loss and dietary changes.     Multiple comorbidities and extensive medication list, patient is having troubles at home keeping up with regimen. We will refer to [facility] to help with home medications.    F/u 1 month      Arnoldo Lenis, M.D.

## 2017-01-23 DIAGNOSIS — G4733 Obstructive sleep apnea (adult) (pediatric): Secondary | ICD-10-CM | POA: Diagnosis not present

## 2017-01-25 DIAGNOSIS — I2510 Atherosclerotic heart disease of native coronary artery without angina pectoris: Secondary | ICD-10-CM | POA: Diagnosis not present

## 2017-01-25 DIAGNOSIS — J441 Chronic obstructive pulmonary disease with (acute) exacerbation: Secondary | ICD-10-CM | POA: Diagnosis not present

## 2017-01-25 DIAGNOSIS — I6789 Other cerebrovascular disease: Secondary | ICD-10-CM | POA: Diagnosis not present

## 2017-01-25 DIAGNOSIS — G4733 Obstructive sleep apnea (adult) (pediatric): Secondary | ICD-10-CM | POA: Diagnosis not present

## 2017-01-25 DIAGNOSIS — R5381 Other malaise: Secondary | ICD-10-CM | POA: Diagnosis not present

## 2017-01-25 DIAGNOSIS — R0902 Hypoxemia: Secondary | ICD-10-CM | POA: Diagnosis not present

## 2017-01-25 DIAGNOSIS — R413 Other amnesia: Secondary | ICD-10-CM | POA: Diagnosis not present

## 2017-01-25 DIAGNOSIS — G4710 Hypersomnia, unspecified: Secondary | ICD-10-CM | POA: Diagnosis not present

## 2017-02-03 DIAGNOSIS — G4733 Obstructive sleep apnea (adult) (pediatric): Secondary | ICD-10-CM | POA: Diagnosis not present

## 2017-02-13 NOTE — Progress Notes (Signed)
Cardiology Office Note   Date:  02/14/2017   ID:  [name], DOB [birth year], MRN [id number]  PCP:  Purvis Kilts, MD  Cardiologist: Cloria Spring, NP   Chief Complaint  Patient presents with  . Coronary Artery Disease  . Hypertension      History of Present Illness: [name] is a 56 y.o. male who presents for ongoing assessment and management of CAD, hx of CABG, chronic angina secondary to CTO of the RCA, and occluded SVG-RCA, Hypertension, PAF, Hyperlipidemia, with other hx to include DM and OSA on CPAP. Mixed compliance with medications. He was referred to [facility] for assistance with medication compliance.   He comes today with ongoing DOE, some exertional chest discomfort. He continues to smoke and is non-compliant with his diet. He is medically compliant currently and has been assisted by [facility] with this. He has been seen by PCP, with labs which indicate uncontrolled diabetes. He remains on CPAP and is followed by Dr. Merlene Laughter.   Past Medical History:  Diagnosis Date  . Abnormal myocardial perfusion study 01/01/2011   there a small to moderate sized inferobasal scar  . Barrett's esophagus   . CAD (coronary artery disease) 2008   bypass grafting   . Cataracts, bilateral   . Chronic kidney disease    hx of kidney stones  . Claudication ([place]) 11/16/2011   PV test perform shows normal  . COPD (chronic obstructive pulmonary disease) ([place])   . Depression   . Diabetes ([place])    type 2 diabetes mellitus  . Dysrhythmia   . GERD (gastroesophageal reflux disease)   . Glaucoma   . Hernia of abdominal wall   . HTN (hypertension)   . Hyperlipidemia   . Morbid obesity ([place])   . Myocardial infarction ([place]) 2008,2009,2009  . OSA (obstructive sleep apnea)    on cpap  . PAF (paroxysmal atrial fibrillation) ([place])   . S/P colonoscopy 2009   3-4 mm transverse colon erosions likely secondary to  ASA  . S/P endoscopy Dec 2011   moderate erosive gastritis,  Barrett's esophagus 1-2cm  . Sleep apnea   . SOB (shortness of breath) 11/03/2007   2D Echo EF 50%-55%  . Stroke [facility])     Past Surgical History:  Procedure Laterality Date  . BIOPSY N/A 04/29/2015   Procedure: BIOPSY;  Surgeon: Danie Binder, MD;  Location: [facility];  Service: Endoscopy;  Laterality: N/A;  . CABG X 4  03/2008  . CARDIAC CATHETERIZATION  2009   stent placement to the left circumflex a 2.25   . CARDIAC CATHETERIZATION  07/08/2010  . CORONARY ARTERY BYPASS GRAFT  2008   4 vessels  . CORONARY STENT PLACEMENT  12/29/12  . CORONARY STENT PLACEMENT  12/2012  . ESOPHAGEAL DILATION N/A 04/29/2015   Procedure: ESOPHAGEAL DILATION 15 mm, 16 mm;  Surgeon: Danie Binder, MD;  Location: [facility];  Service: Endoscopy;  Laterality: N/A;  . ESOPHAGOGASTRODUODENOSCOPY  09/2011   Barrett's esophagus, no dysplasia on biopsies. Distal esophagitis. Status post dilation. Moderate gastritis and duodenitis, but biopsies benign. Next EGD in November 2015 for surveillance of Barrett's esophagus.  . ESOPHAGOGASTRODUODENOSCOPY (EGD) WITH PROPOFOL N/A 04/29/2015   SLF: 1. Barretts esophagus 2. Moderate non-erosive gastritis.   Marland Kitchen HERNIA REPAIR     ventral hernia repair  . LEFT HEART CATHETERIZATION WITH CORONARY/GRAFT ANGIOGRAM N/A 12/29/2012   Procedure: LEFT HEART CATHETERIZATION WITH Beatrix Fetters;  Surgeon: Troy Sine, MD;  Location: [facility]  CATH LAB;  Service: Cardiovascular;  Laterality: N/A;  . ORIF FIBULA FRACTURE Right 06/03/2015   Procedure: OPEN REDUCTION INTERNAL FIXATION (ORIF) DISTAL FIBULA  FRACTURE;  Surgeon: Garald Balding, MD;  Location: [facility];  Service: Orthopedics;  Laterality: Right;  . PERCUTANEOUS CORONARY STENT INTERVENTION (PCI-S)  12/29/2012   Procedure: PERCUTANEOUS CORONARY STENT INTERVENTION (PCI-S);  Surgeon: Troy Sine, MD;  Location: [facility] CATH LAB;  Service: Cardiovascular;;  . SAVORY DILATION  09/06/2011   Procedure: SAVORY DILATION;  Surgeon: Dorothyann Peng,  MD;  Location: [facility];  Service: Endoscopy;  Laterality: N/A;  Dilated with 63m     Current Outpatient Prescriptions  Medication Sig Dispense Refill  . amLODipine (NORVASC) 10 MG tablet Take 1 tablet (10 mg total) by mouth daily. 180 tablet 3  . ANORO ELLIPTA 62.5-25 MCG/INH AEPB INL 1 PUFF PO QD  12  . apixaban (ELIQUIS) 5 MG TABS tablet Take 1 tablet (5 mg total) by mouth 2 (two) times daily. 1 tablet 0  . ARNUITY ELLIPTA 100 MCG/ACT AEPB INL 1 PUFF PO QD  12  . budesonide-formoterol (SYMBICORT) 80-4.5 MCG/ACT inhaler Inhale 2 puffs into the lungs 2 (two) times daily.    . Choline Fenofibrate (TRILIPIX) 135 MG capsule Take 135 mg by mouth at bedtime.     . diazepam (VALIUM) 5 MG tablet Take 5 mg by mouth 2 (two) times daily.    .Marland Kitchenescitalopram (LEXAPRO) 10 MG tablet Take 10 mg by mouth daily.    . furosemide (LASIX) 80 MG tablet Take 80 mg by mouth daily.  4  . isosorbide mononitrate (IMDUR) 60 MG 24 hr tablet Take 1 tablet (60 mg total) by mouth 3 (three) times daily. Please make an appointment for additional refills (Patient taking differently: Take 60 mg by mouth 3 (three) times daily. ) 45 tablet 0  . lisinopril (PRINIVIL,ZESTRIL) 5 MG tablet Take 5 mg by mouth daily.    . metoprolol (LOPRESSOR) 100 MG tablet Take 100 mg by mouth 2 (two) times daily.     . modafinil (PROVIGIL) 100 MG tablet Take 100 mg by mouth 2 (two) times daily as needed (shortness of breath).     . nitroGLYCERIN (NITROLINGUAL) 0.4 MG/SPRAY spray Place 1 spray under the tongue every 5 (five) minutes x 3 doses as needed for chest pain.    . pantoprazole (PROTONIX) 40 MG tablet TAKE 1 TABLET BY MOUTH 30 MINUTES BEFORE BREAKFAST 90 tablet 3  . potassium chloride SA (K-DUR,KLOR-CON) 20 MEQ tablet Take 1 tablet (20 mEq total) by mouth 2 (two) times daily. 30 tablet 0  . ranitidine (ZANTAC) 150 MG capsule Take 150 mg by mouth at bedtime.    . rosuvastatin (CRESTOR) 20 MG tablet Take 20 mg by mouth daily.    . sitaGLIPtin  (JANUVIA) 100 MG tablet Take 100 mg by mouth daily.      No current facility-administered medications for this visit.     Allergies:   Contrast media [iodinated diagnostic agents] and Iohexol    Social History:  The patient  reports that he has been smoking Cigarettes.  He has a 15.00 pack-year smoking history. He has never used smokeless tobacco. He reports that he drinks alcohol. He reports that he does not use drugs.   Family History:  The patient's family history includes Diabetes in his father and mother; Heart disease in his father and mother.    ROS: All other systems are reviewed and negative. Unless otherwise mentioned in H&P  PHYSICAL EXAM: VS:  BP 120/74   Pulse 79   Ht '5\' 11"'$  (1.803 m)   Wt 294 lb (133.4 kg)   SpO2 92%   BMI 41.00 kg/m  , BMI Body mass index is 41 kg/m. GEN: Well nourished, well developed, in no acute distress Morbidly obese.  HEENT: normal  Neck: no JVD, carotid bruits, or masses Cardiac: RRR; no murmurs, rubs, or gallops,no edema  Respiratory:  Inspiratory and expiratory wheezes with coughing.  GI: soft, nontender, nondistended, + BS Obese MS: no deformity or atrophy  Skin: warm and dry, no rash Neuro:  Strength and sensation are intact Psych: euthymic mood, full affect   Recent Labs: 08/23/2016: B Natriuretic Peptide 20.0; BUN 10; Creatinine, Ser 0.97; Hemoglobin 17.2; Platelets 283; Potassium 3.9; Sodium 136    Lipid Panel    Component Value Date/Time   CHOL 129 04/28/2014 0121   TRIG 305 (H) 04/28/2014 0121   HDL 21 (L) 04/28/2014 0121   CHOLHDL 6.1 04/28/2014 0121   VLDL 61 (H) 04/28/2014 0121   LDLCALC 47 04/28/2014 0121      Wt Readings from Last 3 Encounters:  02/14/17 294 lb (133.4 kg)  01/11/17 296 lb (134.3 kg)  10/04/16 295 lb (133.8 kg)      Other studies Reviewed: 12/2012 cath  IMPRESSION: 1. Normal-to-mild left ventricular dysfunction with mid inferior wall hypo-to-akinesis and inferolateral  hypo-to-akinesis with mild upper posterolateral hypocontractility with an ejection fraction approximately 50%. 2. Severe native coronary artery disease with 30-40% eccentric narrowing in the mid left main coronary artery; 50% proximal left anterior descending artery stenosis; total occlusion of the proximal ramus intermedius vessel; progressive 95% ostial stenosis in the circumflex coronary artery, proximal to a previously placed proximal circumflex OM stent and total occlusion of the proximal-to- mid right coronary artery. 3. Old occlusion of the vein graft, which had supplied the right coronary artery. 4. Patent left internal mammary artery graft to the left anterior descending artery. 5. Patent vein graft to the ramus intermedius vessel. 6. Patent vein graft to the distal circumflex vessel. 7. Successful complex percutaneous coronary intervention involving the ostium and proximal portion of the left circumflex coronary artery with cutting balloon arthrotomy and ultimate stenting with a 2.25 x 12-mm DES Xience Xpedition stent postdilated to 2.4 mm with the 95% ostial stenosis being reduced to 0%. 8. Bivalirudin/IC nitroglycerin/the patient is on Effient in addition to aspirin therapy.  12/2012 nuclear stress Impression Exercise Capacity: Lexiscan with no exercise. BP Response: Hypotensive blood pressure response. Clinical Symptoms: Mild chest pain/dyspnea. ECG Impression: No significant ECG changes with Lexiscan. Comparison with Prior Nuclear Study: *There appears to be persistent inferior infarct with superimposed apical and apical lateral reversible ischemia which is new compared to a study in 12/2010.  Overall Impression: Intermediate stress nuclear study.Small to moderate-sized area of reversible ischemia with fixed inferior scar.  LV Wall Motion: LVEF 52%. Inferior hypokinesis.  ASSESSMENT AND PLAN:  1.  CAD:  Most recent cardiac cath as above. He is being treated medically at this time with anginal medications, Eliquis. isosrbide mononitrate, metoprolol, statin and ACE. He is very inactive secondary to exertional dyspnea and chest discomfort. Check echo for changes in LV fx leading to worsening dyspnea.   2. COPD: Wheezing is prominent with frequent coughing.  CXR today.   3. Hypertension: BP is currently well controlled. Continue above medications with amlodipine.   4. Uncontrolled diabetes: PCP is following.  5. Ongoing tobacco abuse; Still smokes 1/2 ppd day or more. Counseling given > 3 minutes.       Current medicines are reviewed at length with the patient today.    Labs/ tests ordered today include:   Orders Placed This Encounter  Procedures  . DG Chest 2 View  . ECHOCARDIOGRAM COMPLETE     Disposition:   FU with 3 months   Signed, Jory Sims, NP  02/14/2017 2:02 PM    [facility] [address] [place], [place] [address] Phone: [phone]; Fax: [phone]

## 2017-02-14 ENCOUNTER — Ambulatory Visit (HOSPITAL_COMMUNITY)
Admission: RE | Admit: 2017-02-14 | Discharge: 2017-02-14 | Disposition: A | Discharge status: Home / Self Care | Payer: Medicare HMO | Source: Ambulatory Visit | Attending: Adult Health | Admitting: Adult Health

## 2017-02-14 ENCOUNTER — Encounter: Payer: Self-pay | Admitting: Adult Health

## 2017-02-14 ENCOUNTER — Ambulatory Visit (INDEPENDENT_AMBULATORY_CARE_PROVIDER_SITE_OTHER): Payer: Medicare HMO | Admitting: Adult Health

## 2017-02-14 VITALS — BP 120/74 | HR 79 | Ht 71.0 in | Wt 294.0 lb

## 2017-02-14 DIAGNOSIS — R059 Cough, unspecified: Secondary | ICD-10-CM

## 2017-02-14 DIAGNOSIS — R0602 Shortness of breath: Secondary | ICD-10-CM | POA: Diagnosis not present

## 2017-02-14 DIAGNOSIS — R69 Illness, unspecified: Secondary | ICD-10-CM | POA: Diagnosis not present

## 2017-02-14 DIAGNOSIS — J9811 Atelectasis: Secondary | ICD-10-CM | POA: Diagnosis not present

## 2017-02-14 DIAGNOSIS — I10 Essential (primary) hypertension: Secondary | ICD-10-CM | POA: Diagnosis not present

## 2017-02-14 DIAGNOSIS — I2510 Atherosclerotic heart disease of native coronary artery without angina pectoris: Secondary | ICD-10-CM

## 2017-02-14 DIAGNOSIS — R05 Cough: Secondary | ICD-10-CM | POA: Insufficient documentation

## 2017-02-14 DIAGNOSIS — R918 Other nonspecific abnormal finding of lung field: Secondary | ICD-10-CM | POA: Diagnosis not present

## 2017-02-14 DIAGNOSIS — F17210 Nicotine dependence, cigarettes, uncomplicated: Secondary | ICD-10-CM

## 2017-02-14 NOTE — Progress Notes (Deleted)
Name: [name]    DOB: [birth year]  Age: 56 y.o.  MR#: [id number]       PCP:  Purvis Kilts, MD      Insurance: Payor: [employer] MEDICARE / Plan: [employer] MEDICARE HMO/PPO / Product Type: *No Product type* /   CC:   No chief complaint on file.   VS Vitals:   02/14/17 1332  Pulse: 79  SpO2: 92%  Weight: 294 lb (133.4 kg)  Height: _0  (1.803 m)    Weights Current Weight  02/14/17 294 lb (133.4 kg)  01/11/17 296 lb (134.3 kg)  10/04/16 295 lb (133.8 kg)    Blood Pressure  BP Readings from Last 3 Encounters:  01/11/17 130/80  10/04/16 136/82  08/23/16 115/77     Admit date:  (Not on file) Last encounter with RMR:  Visit date not found   Allergy Contrast media [iodinated diagnostic agents] and Iohexol  Current Outpatient Prescriptions  Medication Sig Dispense Refill  . amLODipine (NORVASC) 10 MG tablet Take 1 tablet (10 mg total) by mouth daily. 180 tablet 3  . ANORO ELLIPTA 62.5-25 MCG/INH AEPB INL 1 PUFF PO QD  12  . apixaban (ELIQUIS) 5 MG TABS tablet Take 1 tablet (5 mg total) by mouth 2 (two) times daily. 1 tablet 0  . ARNUITY ELLIPTA 100 MCG/ACT AEPB INL 1 PUFF PO QD  12  . budesonide-formoterol (SYMBICORT) 80-4.5 MCG/ACT inhaler Inhale 2 puffs into the lungs 2 (two) times daily.    . Choline Fenofibrate (TRILIPIX) 135 MG capsule Take 135 mg by mouth at bedtime.     . diazepam (VALIUM) 5 MG tablet Take 5 mg by mouth 2 (two) times daily.    Marland Kitchen escitalopram (LEXAPRO) 10 MG tablet Take 10 mg by mouth daily.    . furosemide (LASIX) 80 MG tablet Take 80 mg by mouth daily.  4  . isosorbide mononitrate (IMDUR) 60 MG 24 hr tablet Take 1 tablet (60 mg total) by mouth 3 (three) times daily. Please make an appointment for additional refills (Patient taking differently: Take 60 mg by mouth 3 (three) times daily. ) 45 tablet 0  . lisinopril (PRINIVIL,ZESTRIL) 5 MG tablet Take 5 mg by mouth daily.    . metoprolol (LOPRESSOR) 100 MG tablet Take 100 mg by mouth 2 (two) times  daily.     . modafinil (PROVIGIL) 100 MG tablet Take 100 mg by mouth 2 (two) times daily as needed (shortness of breath).     . nitroGLYCERIN (NITROLINGUAL) 0.4 MG/SPRAY spray Place 1 spray under the tongue every 5 (five) minutes x 3 doses as needed for chest pain.    . pantoprazole (PROTONIX) 40 MG tablet TAKE 1 TABLET BY MOUTH 30 MINUTES BEFORE BREAKFAST 90 tablet 3  . potassium chloride SA (K-DUR,KLOR-CON) 20 MEQ tablet Take 1 tablet (20 mEq total) by mouth 2 (two) times daily. 30 tablet 0  . ranitidine (ZANTAC) 150 MG capsule Take 150 mg by mouth at bedtime.    . rosuvastatin (CRESTOR) 20 MG tablet Take 20 mg by mouth daily.    . sitaGLIPtin (JANUVIA) 100 MG tablet Take 100 mg by mouth daily.      No current facility-administered medications for this visit.     Discontinued Meds:   There are no discontinued medications.  Patient Active Problem List   Diagnosis Date Noted  . COPD exacerbation ([place])   . Fracture of fibula, right, closed 06/03/2015  . Dysphagia 04/07/2015  . Diabetes mellitus, type 2 (  [place]) 04/28/2014  . Obesity, unspecified 04/28/2014  . CVA (cerebral infarction) 04/27/2014  . Aphasia 04/27/2014  . Pneumothorax on right 03/16/2014  . Lung nodule 03/16/2014  . Chest pain 03/15/2014  . Angina, class III ([place]) 03/14/2013  . Tobacco abuse 03/14/2013  . Depression 03/14/2013  . Unstable angina ([place]) 12/30/2012  . Abnormal nuclear stress test 12/30/2012  . Barrett's esophagus 08/25/2011  . ABDOMINAL PAIN, EPIGASTRIC 09/29/2010  . GERD 06/01/2010  . HEMATOCHEZIA 06/01/2010  . HEMATURIA UNSPECIFIED 06/01/2010  . ABDOMINAL PAIN 06/01/2010  . Obstructive sleep apnea 06/21/2008  . RESTLESS LEGS SYNDROME 06/21/2008  . MYOCARDIAL INFARCTION 06/21/2008  . ALLERGIC RHINITIS 06/21/2008  . HYPERLIPIDEMIA 06/20/2008  . Morbid obesity ([place]) 06/20/2008  . HYPERTENSION 06/20/2008  . CORONARY ARTERY DISEASE 06/20/2008  . PAROXYSMAL ATRIAL FIBRILLATION 06/20/2008    LABS     Component Value Date/Time   NA 136 08/23/2016 1531   NA 134 (L) 02/13/2016 0208   NA 139 06/02/2015 1526   K 3.9 08/23/2016 1531   K 3.1 (L) 02/13/2016 0208   K 3.4 (L) 06/02/2015 1526   CL 101 08/23/2016 1531   CL 98 (L) 02/13/2016 0208   CL 104 06/02/2015 1526   CO2 26 08/23/2016 1531   CO2 22 02/13/2016 0208   CO2 26 06/02/2015 1526   GLUCOSE 164 (H) 08/23/2016 1531   GLUCOSE 169 (H) 02/13/2016 0208   GLUCOSE 111 (H) 06/02/2015 1526   BUN 10 08/23/2016 1531   BUN 12 02/13/2016 0208   BUN 7 06/02/2015 1526   CREATININE 0.97 08/23/2016 1531   CREATININE 1.13 02/13/2016 0208   CREATININE 1.09 06/02/2015 1526   CALCIUM 9.6 08/23/2016 1531   CALCIUM 8.7 (L) 02/13/2016 0208   CALCIUM 9.3 06/02/2015 1526   GFRNONAA >60 08/23/2016 1531   GFRNONAA >60 02/13/2016 0208   GFRNONAA >60 06/02/2015 1526   GFRAA >60 08/23/2016 1531   GFRAA >60 02/13/2016 0208   GFRAA >60 06/02/2015 1526   CMP     Component Value Date/Time   NA 136 08/23/2016 1531   K 3.9 08/23/2016 1531   CL 101 08/23/2016 1531   CO2 26 08/23/2016 1531   GLUCOSE 164 (H) 08/23/2016 1531   BUN 10 08/23/2016 1531   CREATININE 0.97 08/23/2016 1531   CALCIUM 9.6 08/23/2016 1531   PROT 6.4 (L) 06/02/2015 1526   ALBUMIN 3.4 (L) 06/02/2015 1526   AST 28 06/02/2015 1526   ALT 31 06/02/2015 1526   ALKPHOS 65 06/02/2015 1526   BILITOT 0.7 06/02/2015 1526   GFRNONAA >60 08/23/2016 1531   GFRAA >60 08/23/2016 1531       Component Value Date/Time   WBC 15.1 (H) 08/23/2016 1531   WBC 13.3 (H) 02/13/2016 0208   WBC 13.1 (H) 06/02/2015 1526   HGB 17.2 (H) 08/23/2016 1531   HGB 16.4 02/13/2016 0208   HGB 17.1 (H) 06/02/2015 1526   HCT 47.8 08/23/2016 1531   HCT 46.6 02/13/2016 0208   HCT 47.5 06/02/2015 1526   MCV 95.4 08/23/2016 1531   MCV 96.5 02/13/2016 0208   MCV 94.4 06/02/2015 1526    Lipid Panel     Component Value Date/Time   CHOL 129 04/28/2014 0121   TRIG 305 (H) 04/28/2014 0121   HDL 21 (L)  04/28/2014 0121   CHOLHDL 6.1 04/28/2014 0121   VLDL 61 (H) 04/28/2014 0121   LDLCALC 47 04/28/2014 0121    ABG    Component Value Date/Time   PHART 7.402 09/19/2007  1651   PCO2ART 35.0 09/19/2007 1651   PO2ART 77.0 (L) 09/18/2007 1708   HCO3 21.8 09/19/2007 1651   TCO2 23 09/19/2007 1651   ACIDBASEDEF 2.0 09/19/2007 1651   O2SAT 94.0 09/18/2007 1708     Lab Results  Component Value Date   TSH 1.592 ***Test methodology is 3rd generation TSH*** 01/11/2008   BNP (last 3 results)  Recent Labs  08/23/16 1531  BNP 20.0    ProBNP (last 3 results) No results for input(s): PROBNP in the last 8760 hours.  Cardiac Panel (last 3 results) No results for input(s): CKTOTAL, CKMB, TROPONINI, RELINDX in the last 72 hours.  Iron/TIBC/Ferritin/ %Sat No results found for: IRON, TIBC, FERRITIN, IRONPCTSAT   EKG Orders placed or performed during the hospital encounter of 08/23/16  . EKG 12-Lead  . EKG 12-Lead  . ED EKG  . ED EKG  . EKG     Prior Assessment and Plan Problem List as of 02/14/2017 Reviewed: 01/12/2017  1:52 PM by Carlyle Dolly, MD     Cardiovascular and Mediastinum   Angina, class III [facility])   Last Assessment & Plan 03/14/2013 Office Visit Written 03/14/2013  9:44 AM by Sanda Klein, MD    This is likely related to continued ischemia in the inferior wall. There are no good revascularization options. Both the native vessel and the bypass graft are occluded. He is not a good candidate for redo surgery and it is uncertain whether the distal branches of the right coronary artery would be amenable to redo bypass.  The best option remains medical therapy. He is oriented high doses of long-acting nitrates as well as full doses were no losing. I am reluctant to increase his beta blocker any further since he has wheezing and shortness of breath. I think her shortness of breath is related to his lung problems rather than diastolic dysfunction based on the echocardiographic  findings.  His blood pressure may limit further options about like to try low dose of amlodipine 2.5 mg      HYPERTENSION   MYOCARDIAL INFARCTION   CORONARY ARTERY DISEASE   PAROXYSMAL ATRIAL FIBRILLATION   Last Assessment & Plan 03/14/2013 Office Visit Written 03/14/2013  9:46 AM by Sanda Klein, MD    No clinical recurrence in a long time      Unstable angina [facility])     Respiratory   Obstructive sleep apnea   Last Assessment & Plan 03/14/2013 Office Visit Written 03/14/2013  9:44 AM by Sanda Klein, MD    He is 100% compliant with BiPAP therapy      ALLERGIC RHINITIS   Pneumothorax on right   COPD exacerbation [facility])     Digestive   GERD   Last Assessment & Plan 08/28/2015 Office Visit Written 08/28/2015 11:39 AM by Carlis Stable, NP    Patient with history of GERD with gastritis demonstrated on recent endoscopy. GERD symptoms are much improved, occasional flareup about once every 2-3 weeks. Has made a concerted effort to lose weight and has lost about 15 pounds in 4 months intentionally. Continue weight loss will likely improve his GERD symptoms. Continue PPI for life due to Barrett's esophagus as noted below. Return for follow-up in one year or sooner if any worsening or change in symptoms.      HEMATOCHEZIA   Last Assessment & Plan 08/30/2013 Office Visit Written 09/03/2013  4:50 PM by Orvil Feil, NP    Continue Anusol prn. Appears scant. Not candidate for banding currently. Return  in 3-4 months.       Barrett's esophagus   Last Assessment & Plan 08/28/2015 Office Visit Written 08/28/2015 11:40 AM by Carlis Stable, NP    Them showed Barrett's esophagus on recent endoscopy. Remains on PPI which he is taking regularly. Explained the need to take this for life due to Barrett's esophagus as well as the increased risk of cancer due to Barrett's esophagus although overall low risk of cancer. We'll be due for repeat surveillance endoscopy in 3 years either at [facility] ordered [facility], depending on his BMI that time. Is increased BMI is increased risk of anesthesia complications. Has lost 15 pounds in the past 4 months intentionally and continues weight loss efforts. Return for follow-up in one year or sooner if needed for worsening or returning symptoms.      Dysphagia   Last Assessment & Plan 04/07/2015 Office Visit Written 04/07/2015  9:36 AM by Carlis Stable, NP    Patient has she does have a lot of choking with foods. Was evaluated by ENT who thought muscle spasms with a likely culprit and subsequently was started on Valium. However symptoms have not improved. He states he does have a sensation of things getting stuck "halfway down". His dysphagia occurs with solid foods, pills, and liquids. At this point he is due for endoscopy for surveillance of Barrett's esophagus. We can also evaluate him for possible cause of dysphagia symptoms. Therefore we'll add on a possible dilation to reschedule an endoscopy if appropriate given anticoagulation status.   Proceed with EGD +/- dilation in the OR with MAC with Dr. Oneida Alar in near future: the risks, benefits, and alternatives have been discussed with the patient in detail. The patient states understanding and desires to proceed.  Patient is on Eliquis for recent TIA with minimal to no residual symptoms.        Endocrine   Diabetes mellitus, type 2 (HCC)     Nervous and Auditory   CVA (cerebral infarction)     Musculoskeletal and Integument   Fracture of fibula, right, closed     Other   HYPERLIPIDEMIA   Last Assessment & Plan 03/14/2013 Office Visit Written 03/14/2013  9:48 AM by Sanda Klein, MD    On current statin therapy and fibrates his total cholesterol, LDL cholesterol and HDL cholesterol level or at an acceptable level. It was a little sore still time but I doubt that this will improve without weight loss and improved control of his glycemia. Changes are made to his medical therapy.      Morbid obesity ([place])    RESTLESS LEGS SYNDROME   HEMATURIA UNSPECIFIED   ABDOMINAL PAIN   ABDOMINAL PAIN, EPIGASTRIC   Abnormal nuclear stress test   Tobacco abuse   Depression   Chest pain   Lung nodule   Aphasia   Obesity, unspecified       Imaging: No results found.

## 2017-02-14 NOTE — Progress Notes (Deleted)
Name: [name]    DOB: 08/09/1961  Age: 56 y.o.  MR#: [id number]       PCP:  Purvis Kilts, MD      Insurance: Payor: [employer] MEDICARE / Plan: [employer] MEDICARE HMO/PPO / Product Type: *No Product type* /   CC:    Chief Complaint  Patient presents with  . Coronary Artery Disease  . Hypertension    VS Vitals:   02/14/17 1332  BP: 120/74  Pulse: 79  SpO2: 92%  Weight: 294 lb (133.4 kg)  Height: 5' 11"  (1.803 m)    Weights Current Weight  02/14/17 294 lb (133.4 kg)  01/11/17 296 lb (134.3 kg)  10/04/16 295 lb (133.8 kg)    Blood Pressure  BP Readings from Last 3 Encounters:  02/14/17 120/74  01/11/17 130/80  10/04/16 136/82     Admit date:  (Not on file) Last encounter with RMR:  Visit date not found   Allergy Contrast media [iodinated diagnostic agents] and Iohexol  Current Outpatient Prescriptions  Medication Sig Dispense Refill  . amLODipine (NORVASC) 10 MG tablet Take 1 tablet (10 mg total) by mouth daily. 180 tablet 3  . ANORO ELLIPTA 62.5-25 MCG/INH AEPB INL 1 PUFF PO QD  12  . apixaban (ELIQUIS) 5 MG TABS tablet Take 1 tablet (5 mg total) by mouth 2 (two) times daily. 1 tablet 0  . ARNUITY ELLIPTA 100 MCG/ACT AEPB INL 1 PUFF PO QD  12  . budesonide-formoterol (SYMBICORT) 80-4.5 MCG/ACT inhaler Inhale 2 puffs into the lungs 2 (two) times daily.    . Choline Fenofibrate (TRILIPIX) 135 MG capsule Take 135 mg by mouth at bedtime.     . diazepam (VALIUM) 5 MG tablet Take 5 mg by mouth 2 (two) times daily.    Marland Kitchen escitalopram (LEXAPRO) 10 MG tablet Take 10 mg by mouth daily.    . furosemide (LASIX) 80 MG tablet Take 80 mg by mouth daily.  4  . isosorbide mononitrate (IMDUR) 60 MG 24 hr tablet Take 1 tablet (60 mg total) by mouth 3 (three) times daily. Please make an appointment for additional refills (Patient taking differently: Take 60 mg by mouth 3 (three) times daily. ) 45 tablet 0  . lisinopril (PRINIVIL,ZESTRIL) 5 MG tablet Take 5 mg by mouth daily.    .  metoprolol (LOPRESSOR) 100 MG tablet Take 100 mg by mouth 2 (two) times daily.     . modafinil (PROVIGIL) 100 MG tablet Take 100 mg by mouth 2 (two) times daily as needed (shortness of breath).     . nitroGLYCERIN (NITROLINGUAL) 0.4 MG/SPRAY spray Place 1 spray under the tongue every 5 (five) minutes x 3 doses as needed for chest pain.    . pantoprazole (PROTONIX) 40 MG tablet TAKE 1 TABLET BY MOUTH 30 MINUTES BEFORE BREAKFAST 90 tablet 3  . potassium chloride SA (K-DUR,KLOR-CON) 20 MEQ tablet Take 1 tablet (20 mEq total) by mouth 2 (two) times daily. 30 tablet 0  . ranitidine (ZANTAC) 150 MG capsule Take 150 mg by mouth at bedtime.    . rosuvastatin (CRESTOR) 20 MG tablet Take 20 mg by mouth daily.    . sitaGLIPtin (JANUVIA) 100 MG tablet Take 100 mg by mouth daily.      No current facility-administered medications for this visit.     Discontinued Meds:   There are no discontinued medications.  Patient Active Problem List   Diagnosis Date Noted  . COPD exacerbation ([place])   . Fracture of  fibula, right, closed 06/03/2015  . Dysphagia 04/07/2015  . Diabetes mellitus, type 2 ([place]) 04/28/2014  . Obesity, unspecified 04/28/2014  . CVA (cerebral infarction) 04/27/2014  . Aphasia 04/27/2014  . Pneumothorax on right 03/16/2014  . Lung nodule 03/16/2014  . Chest pain 03/15/2014  . Angina, class III ([place]) 03/14/2013  . Tobacco abuse 03/14/2013  . Depression 03/14/2013  . Unstable angina ([place]) 12/30/2012  . Abnormal nuclear stress test 12/30/2012  . Barrett's esophagus 08/25/2011  . ABDOMINAL PAIN, EPIGASTRIC 09/29/2010  . GERD 06/01/2010  . HEMATOCHEZIA 06/01/2010  . HEMATURIA UNSPECIFIED 06/01/2010  . ABDOMINAL PAIN 06/01/2010  . Obstructive sleep apnea 06/21/2008  . RESTLESS LEGS SYNDROME 06/21/2008  . MYOCARDIAL INFARCTION 06/21/2008  . ALLERGIC RHINITIS 06/21/2008  . HYPERLIPIDEMIA 06/20/2008  . Morbid obesity ([place]) 06/20/2008  . HYPERTENSION 06/20/2008  . CORONARY ARTERY  DISEASE 06/20/2008  . PAROXYSMAL ATRIAL FIBRILLATION 06/20/2008    LABS    Component Value Date/Time   NA 136 08/23/2016 1531   NA 134 (L) 02/13/2016 0208   NA 139 06/02/2015 1526   K 3.9 08/23/2016 1531   K 3.1 (L) 02/13/2016 0208   K 3.4 (L) 06/02/2015 1526   CL 101 08/23/2016 1531   CL 98 (L) 02/13/2016 0208   CL 104 06/02/2015 1526   CO2 26 08/23/2016 1531   CO2 22 02/13/2016 0208   CO2 26 06/02/2015 1526   GLUCOSE 164 (H) 08/23/2016 1531   GLUCOSE 169 (H) 02/13/2016 0208   GLUCOSE 111 (H) 06/02/2015 1526   BUN 10 08/23/2016 1531   BUN 12 02/13/2016 0208   BUN 7 06/02/2015 1526   CREATININE 0.97 08/23/2016 1531   CREATININE 1.13 02/13/2016 0208   CREATININE 1.09 06/02/2015 1526   CALCIUM 9.6 08/23/2016 1531   CALCIUM 8.7 (L) 02/13/2016 0208   CALCIUM 9.3 06/02/2015 1526   GFRNONAA >60 08/23/2016 1531   GFRNONAA >60 02/13/2016 0208   GFRNONAA >60 06/02/2015 1526   GFRAA >60 08/23/2016 1531   GFRAA >60 02/13/2016 0208   GFRAA >60 06/02/2015 1526   CMP     Component Value Date/Time   NA 136 08/23/2016 1531   K 3.9 08/23/2016 1531   CL 101 08/23/2016 1531   CO2 26 08/23/2016 1531   GLUCOSE 164 (H) 08/23/2016 1531   BUN 10 08/23/2016 1531   CREATININE 0.97 08/23/2016 1531   CALCIUM 9.6 08/23/2016 1531   PROT 6.4 (L) 06/02/2015 1526   ALBUMIN 3.4 (L) 06/02/2015 1526   AST 28 06/02/2015 1526   ALT 31 06/02/2015 1526   ALKPHOS 65 06/02/2015 1526   BILITOT 0.7 06/02/2015 1526   GFRNONAA >60 08/23/2016 1531   GFRAA >60 08/23/2016 1531       Component Value Date/Time   WBC 15.1 (H) 08/23/2016 1531   WBC 13.3 (H) 02/13/2016 0208   WBC 13.1 (H) 06/02/2015 1526   HGB 17.2 (H) 08/23/2016 1531   HGB 16.4 02/13/2016 0208   HGB 17.1 (H) 06/02/2015 1526   HCT 47.8 08/23/2016 1531   HCT 46.6 02/13/2016 0208   HCT 47.5 06/02/2015 1526   MCV 95.4 08/23/2016 1531   MCV 96.5 02/13/2016 0208   MCV 94.4 06/02/2015 1526    Lipid Panel     Component Value Date/Time    CHOL 129 04/28/2014 0121   TRIG 305 (H) 04/28/2014 0121   HDL 21 (L) 04/28/2014 0121   CHOLHDL 6.1 04/28/2014 0121   VLDL 61 (H) 04/28/2014 0121   LDLCALC 47 04/28/2014 0121  ABG    Component Value Date/Time   PHART 7.402 09/19/2007 1651   PCO2ART 35.0 09/19/2007 1651   PO2ART 77.0 (L) 09/18/2007 1708   HCO3 21.8 09/19/2007 1651   TCO2 23 09/19/2007 1651   ACIDBASEDEF 2.0 09/19/2007 1651   O2SAT 94.0 09/18/2007 1708     Lab Results  Component Value Date   TSH 1.592 ***Test methodology is 3rd generation TSH*** 01/11/2008   BNP (last 3 results)  Recent Labs  08/23/16 1531  BNP 20.0    ProBNP (last 3 results) No results for input(s): PROBNP in the last 8760 hours.  Cardiac Panel (last 3 results) No results for input(s): CKTOTAL, CKMB, TROPONINI, RELINDX in the last 72 hours.  Iron/TIBC/Ferritin/ %Sat No results found for: IRON, TIBC, FERRITIN, IRONPCTSAT   EKG Orders placed or performed during the hospital encounter of 08/23/16  . EKG 12-Lead  . EKG 12-Lead  . ED EKG  . ED EKG  . EKG     Prior Assessment and Plan Problem List as of 02/14/2017 Reviewed: 01/12/2017  1:52 PM by Carlyle Dolly, MD     Cardiovascular and Mediastinum   Angina, class III [facility])   Last Assessment & Plan 03/14/2013 Office Visit Written 03/14/2013  9:44 AM by Sanda Klein, MD    This is likely related to continued ischemia in the inferior wall. There are no good revascularization options. Both the native vessel and the bypass graft are occluded. He is not a good candidate for redo surgery and it is uncertain whether the distal branches of the right coronary artery would be amenable to redo bypass.  The best option remains medical therapy. He is oriented high doses of long-acting nitrates as well as full doses were no losing. I am reluctant to increase his beta blocker any further since he has wheezing and shortness of breath. I think her shortness of breath is related to his lung  problems rather than diastolic dysfunction based on the echocardiographic findings.  His blood pressure may limit further options about like to try low dose of amlodipine 2.5 mg      HYPERTENSION   MYOCARDIAL INFARCTION   CORONARY ARTERY DISEASE   PAROXYSMAL ATRIAL FIBRILLATION   Last Assessment & Plan 03/14/2013 Office Visit Written 03/14/2013  9:46 AM by Sanda Klein, MD    No clinical recurrence in a long time      Unstable angina [facility])     Respiratory   Obstructive sleep apnea   Last Assessment & Plan 03/14/2013 Office Visit Written 03/14/2013  9:44 AM by Sanda Klein, MD    He is 100% compliant with BiPAP therapy      ALLERGIC RHINITIS   Pneumothorax on right   COPD exacerbation [facility])     Digestive   GERD   Last Assessment & Plan 08/28/2015 Office Visit Written 08/28/2015 11:39 AM by Carlis Stable, NP    Patient with history of GERD with gastritis demonstrated on recent endoscopy. GERD symptoms are much improved, occasional flareup about once every 2-3 weeks. Has made a concerted effort to lose weight and has lost about 15 pounds in 4 months intentionally. Continue weight loss will likely improve his GERD symptoms. Continue PPI for life due to Barrett's esophagus as noted below. Return for follow-up in one year or sooner if any worsening or change in symptoms.      HEMATOCHEZIA   Last Assessment & Plan 08/30/2013 Office Visit Written 09/03/2013  4:50 PM by Orvil Feil, NP  Continue Anusol prn. Appears scant. Not candidate for banding currently. Return in 3-4 months.       Barrett's esophagus   Last Assessment & Plan 08/28/2015 Office Visit Written 08/28/2015 11:40 AM by Carlis Stable, NP    Them showed Barrett's esophagus on recent endoscopy. Remains on PPI which he is taking regularly. Explained the need to take this for life due to Barrett's esophagus as well as the increased risk of cancer due to Barrett's esophagus although overall low risk of cancer. We'll be due for  repeat surveillance endoscopy in 3 years either at [facility] ordered [facility], depending on his BMI that time. Is increased BMI is increased risk of anesthesia complications. Has lost 15 pounds in the past 4 months intentionally and continues weight loss efforts. Return for follow-up in one year or sooner if needed for worsening or returning symptoms.      Dysphagia   Last Assessment & Plan 04/07/2015 Office Visit Written 04/07/2015  9:36 AM by Carlis Stable, NP    Patient has she does have a lot of choking with foods. Was evaluated by ENT who thought muscle spasms with a likely culprit and subsequently was started on Valium. However symptoms have not improved. He states he does have a sensation of things getting stuck "halfway down". His dysphagia occurs with solid foods, pills, and liquids. At this point he is due for endoscopy for surveillance of Barrett's esophagus. We can also evaluate him for possible cause of dysphagia symptoms. Therefore we'll add on a possible dilation to reschedule an endoscopy if appropriate given anticoagulation status.   Proceed with EGD +/- dilation in the OR with MAC with Dr. Oneida Alar in near future: the risks, benefits, and alternatives have been discussed with the patient in detail. The patient states understanding and desires to proceed.  Patient is on Eliquis for recent TIA with minimal to no residual symptoms.        Endocrine   Diabetes mellitus, type 2 (HCC)     Nervous and Auditory   CVA (cerebral infarction)     Musculoskeletal and Integument   Fracture of fibula, right, closed     Other   HYPERLIPIDEMIA   Last Assessment & Plan 03/14/2013 Office Visit Written 03/14/2013  9:48 AM by Sanda Klein, MD    On current statin therapy and fibrates his total cholesterol, LDL cholesterol and HDL cholesterol level or at an acceptable level. It was a little sore still time but I doubt that this will improve without weight loss and improved control of his glycemia.  Changes are made to his medical therapy.      Morbid obesity ([place])   RESTLESS LEGS SYNDROME   HEMATURIA UNSPECIFIED   ABDOMINAL PAIN   ABDOMINAL PAIN, EPIGASTRIC   Abnormal nuclear stress test   Tobacco abuse   Depression   Chest pain   Lung nodule   Aphasia   Obesity, unspecified       Imaging: No results found.

## 2017-02-14 NOTE — Patient Instructions (Signed)
Your physician wants you to follow-up in: 3 Months with Dr. Harl Bowie. You will receive a reminder letter in the mail two months in advance. If you don't receive a letter, please call our office to schedule the follow-up appointment.  Your physician recommends that you continue on your current medications as directed. Please refer to the Current Medication list given to you today.   Your physician has requested that you have an echocardiogram. Echocardiography is a painless test that uses sound waves to create images of your heart. It provides your doctor with information about the size and shape of your heart and how well your heart's chambers and valves are working. This procedure takes approximately one hour. There are no restrictions for this procedure.  Have Chest X-Ray done today.   If you need a refill on your cardiac medications before your next appointment, please call your pharmacy.  Thank you for choosing [facility]!

## 2017-02-18 DIAGNOSIS — R69 Illness, unspecified: Secondary | ICD-10-CM | POA: Diagnosis not present

## 2017-02-18 DIAGNOSIS — I2510 Atherosclerotic heart disease of native coronary artery without angina pectoris: Secondary | ICD-10-CM | POA: Diagnosis not present

## 2017-02-18 DIAGNOSIS — E118 Type 2 diabetes mellitus with unspecified complications: Secondary | ICD-10-CM | POA: Diagnosis not present

## 2017-02-18 DIAGNOSIS — E1151 Type 2 diabetes mellitus with diabetic peripheral angiopathy without gangrene: Secondary | ICD-10-CM | POA: Diagnosis not present

## 2017-02-18 DIAGNOSIS — E782 Mixed hyperlipidemia: Secondary | ICD-10-CM | POA: Diagnosis not present

## 2017-02-18 DIAGNOSIS — Z1389 Encounter for screening for other disorder: Secondary | ICD-10-CM | POA: Diagnosis not present

## 2017-02-18 DIAGNOSIS — I10 Essential (primary) hypertension: Secondary | ICD-10-CM | POA: Diagnosis not present

## 2017-02-18 DIAGNOSIS — Z6841 Body Mass Index (BMI) 40.0 and over, adult: Secondary | ICD-10-CM | POA: Diagnosis not present

## 2017-02-18 LAB — LIPID PANEL
Cholesterol: 136 (ref 0–200)
HDL: 28 — AB (ref 35–70)
Triglycerides: 559 — AB (ref 40–160)

## 2017-02-18 LAB — BASIC METABOLIC PANEL
BUN: 7 (ref 4–21)
CREATININE: 0.7 (ref <=1.3)

## 2017-02-18 LAB — HEMOGLOBIN A1C: Hemoglobin A1C: 10.6

## 2017-02-21 ENCOUNTER — Ambulatory Visit (HOSPITAL_COMMUNITY)
Admission: RE | Admit: 2017-02-21 | Discharge: 2017-02-21 | Disposition: A | Discharge status: Home / Self Care | Payer: Medicare HMO | Source: Ambulatory Visit | Attending: Adult Health | Admitting: Adult Health

## 2017-02-21 DIAGNOSIS — I2510 Atherosclerotic heart disease of native coronary artery without angina pectoris: Secondary | ICD-10-CM | POA: Insufficient documentation

## 2017-02-21 DIAGNOSIS — I071 Rheumatic tricuspid insufficiency: Secondary | ICD-10-CM | POA: Insufficient documentation

## 2017-02-21 DIAGNOSIS — E785 Hyperlipidemia, unspecified: Secondary | ICD-10-CM | POA: Diagnosis not present

## 2017-02-21 DIAGNOSIS — E119 Type 2 diabetes mellitus without complications: Secondary | ICD-10-CM | POA: Insufficient documentation

## 2017-02-21 DIAGNOSIS — I10 Essential (primary) hypertension: Secondary | ICD-10-CM | POA: Insufficient documentation

## 2017-02-21 DIAGNOSIS — Z87891 Personal history of nicotine dependence: Secondary | ICD-10-CM | POA: Insufficient documentation

## 2017-02-21 DIAGNOSIS — K219 Gastro-esophageal reflux disease without esophagitis: Secondary | ICD-10-CM | POA: Insufficient documentation

## 2017-02-21 DIAGNOSIS — I219 Acute myocardial infarction, unspecified: Secondary | ICD-10-CM | POA: Diagnosis not present

## 2017-02-21 LAB — ECHOCARDIOGRAM COMPLETE
CHL CUP DOP CALC LVOT VTI: 23.8 cm
CHL CUP STROKE VOLUME: 42 mL
E decel time: 211 msec
EERAT: 11.1
FS: 27 % — AB (ref 28–44)
IVS/LV PW RATIO, ED: 1.01
LA diam end sys: 43 mm
LADIAMINDEX: 1.63 cm/m2
LASIZE: 43 mm
LAVOL: 54.2 mL
LAVOLA4C: 53.8 mL
LAVOLIN: 20.5 mL/m2
LDCA: 3.46 cm2
LV E/e'average: 11.1
LV PW d: 9.12 mm — AB (ref 0.6–1.1)
LV SIMPSON'S DISK: 58
LV TDI E'LATERAL: 10
LV dias vol index: 27 mL/m2
LV e' LATERAL: 10 cm/s
LV sys vol: 30 mL
LVDIAVOL: 71 mL (ref 62–150)
LVEEMED: 11.1
LVOT SV: 82 mL
LVOT diameter: 21 mm
LVOT peak grad rest: 5 mmHg
LVOT peak vel: 108 cm/s
LVSYSVOLIN: 11 mL/m2
Lateral S' vel: 11 cm/s
MV Dec: 211
MV Peak grad: 5 mmHg
MV pk E vel: 111 m/s
MVPKAVEL: 85.4 m/s
RV sys press: 33 mmHg
Reg peak vel: 276 cm/s
TAPSE: 19.4 mm
TDI e' medial: 8.38
TR max vel: 276 cm/s

## 2017-02-21 NOTE — Progress Notes (Signed)
*  PRELIMINARY RESULTS* Echocardiogram 2D Echocardiogram has been performed.  Samuel Germany 02/21/2017, 12:11 PM

## 2017-03-05 DIAGNOSIS — G4733 Obstructive sleep apnea (adult) (pediatric): Secondary | ICD-10-CM | POA: Diagnosis not present

## 2017-03-17 ENCOUNTER — Other Ambulatory Visit: Payer: Self-pay | Admitting: *Deleted

## 2017-03-17 MED ORDER — METOPROLOL TARTRATE 100 MG PO TABS: 100.0000 mg | ORAL_TABLET | Freq: Two times a day (BID) | ORAL | 3 refills | Status: DC

## 2017-03-29 DIAGNOSIS — H01022 Squamous blepharitis right lower eyelid: Secondary | ICD-10-CM | POA: Diagnosis not present

## 2017-03-29 DIAGNOSIS — H40013 Open angle with borderline findings, low risk, bilateral: Secondary | ICD-10-CM | POA: Diagnosis not present

## 2017-03-29 DIAGNOSIS — H02831 Dermatochalasis of right upper eyelid: Secondary | ICD-10-CM | POA: Diagnosis not present

## 2017-03-29 DIAGNOSIS — H2513 Age-related nuclear cataract, bilateral: Secondary | ICD-10-CM | POA: Diagnosis not present

## 2017-03-29 DIAGNOSIS — H02054 Trichiasis without entropian left upper eyelid: Secondary | ICD-10-CM | POA: Diagnosis not present

## 2017-03-29 DIAGNOSIS — H02834 Dermatochalasis of left upper eyelid: Secondary | ICD-10-CM | POA: Diagnosis not present

## 2017-03-29 DIAGNOSIS — E119 Type 2 diabetes mellitus without complications: Secondary | ICD-10-CM | POA: Diagnosis not present

## 2017-03-29 DIAGNOSIS — H01024 Squamous blepharitis left upper eyelid: Secondary | ICD-10-CM | POA: Diagnosis not present

## 2017-03-29 DIAGNOSIS — H01021 Squamous blepharitis right upper eyelid: Secondary | ICD-10-CM | POA: Diagnosis not present

## 2017-03-29 DIAGNOSIS — H01025 Squamous blepharitis left lower eyelid: Secondary | ICD-10-CM | POA: Diagnosis not present

## 2017-03-31 DIAGNOSIS — H02831 Dermatochalasis of right upper eyelid: Secondary | ICD-10-CM | POA: Diagnosis not present

## 2017-03-31 DIAGNOSIS — H02423 Myogenic ptosis of bilateral eyelids: Secondary | ICD-10-CM | POA: Diagnosis not present

## 2017-03-31 DIAGNOSIS — H53483 Generalized contraction of visual field, bilateral: Secondary | ICD-10-CM | POA: Diagnosis not present

## 2017-03-31 DIAGNOSIS — G4733 Obstructive sleep apnea (adult) (pediatric): Secondary | ICD-10-CM | POA: Diagnosis not present

## 2017-03-31 DIAGNOSIS — I515 Myocardial degeneration: Secondary | ICD-10-CM | POA: Insufficient documentation

## 2017-03-31 DIAGNOSIS — I249 Acute ischemic heart disease, unspecified: Secondary | ICD-10-CM | POA: Diagnosis not present

## 2017-03-31 DIAGNOSIS — H02834 Dermatochalasis of left upper eyelid: Secondary | ICD-10-CM | POA: Diagnosis not present

## 2017-03-31 DIAGNOSIS — H02413 Mechanical ptosis of bilateral eyelids: Secondary | ICD-10-CM | POA: Diagnosis not present

## 2017-03-31 DIAGNOSIS — J449 Chronic obstructive pulmonary disease, unspecified: Secondary | ICD-10-CM | POA: Diagnosis not present

## 2017-03-31 DIAGNOSIS — H0279 Other degenerative disorders of eyelid and periocular area: Secondary | ICD-10-CM | POA: Diagnosis not present

## 2017-04-05 DIAGNOSIS — G4733 Obstructive sleep apnea (adult) (pediatric): Secondary | ICD-10-CM | POA: Diagnosis not present

## 2017-05-05 DIAGNOSIS — G4733 Obstructive sleep apnea (adult) (pediatric): Secondary | ICD-10-CM | POA: Diagnosis not present

## 2017-05-16 ENCOUNTER — Ambulatory Visit: Payer: Medicare HMO | Admitting: Cardiology

## 2017-05-23 DIAGNOSIS — E118 Type 2 diabetes mellitus with unspecified complications: Secondary | ICD-10-CM | POA: Diagnosis not present

## 2017-05-23 DIAGNOSIS — Z0000 Encounter for general adult medical examination without abnormal findings: Secondary | ICD-10-CM | POA: Diagnosis not present

## 2017-05-23 DIAGNOSIS — Z6841 Body Mass Index (BMI) 40.0 and over, adult: Secondary | ICD-10-CM | POA: Diagnosis not present

## 2017-05-23 DIAGNOSIS — G4733 Obstructive sleep apnea (adult) (pediatric): Secondary | ICD-10-CM | POA: Diagnosis not present

## 2017-05-23 DIAGNOSIS — E1151 Type 2 diabetes mellitus with diabetic peripheral angiopathy without gangrene: Secondary | ICD-10-CM | POA: Diagnosis not present

## 2017-05-23 DIAGNOSIS — I25700 Atherosclerosis of coronary artery bypass graft(s), unspecified, with unstable angina pectoris: Secondary | ICD-10-CM | POA: Diagnosis not present

## 2017-05-23 DIAGNOSIS — J441 Chronic obstructive pulmonary disease with (acute) exacerbation: Secondary | ICD-10-CM | POA: Diagnosis not present

## 2017-05-23 LAB — HEMOGLOBIN A1C: Hemoglobin A1C: 9.4

## 2017-05-24 DIAGNOSIS — R5381 Other malaise: Secondary | ICD-10-CM | POA: Diagnosis not present

## 2017-05-24 DIAGNOSIS — G4710 Hypersomnia, unspecified: Secondary | ICD-10-CM | POA: Diagnosis not present

## 2017-05-24 DIAGNOSIS — R0902 Hypoxemia: Secondary | ICD-10-CM | POA: Diagnosis not present

## 2017-05-24 DIAGNOSIS — J441 Chronic obstructive pulmonary disease with (acute) exacerbation: Secondary | ICD-10-CM | POA: Diagnosis not present

## 2017-05-24 DIAGNOSIS — G4733 Obstructive sleep apnea (adult) (pediatric): Secondary | ICD-10-CM | POA: Diagnosis not present

## 2017-05-24 DIAGNOSIS — I2510 Atherosclerotic heart disease of native coronary artery without angina pectoris: Secondary | ICD-10-CM | POA: Diagnosis not present

## 2017-05-24 DIAGNOSIS — I6789 Other cerebrovascular disease: Secondary | ICD-10-CM | POA: Diagnosis not present

## 2017-05-24 DIAGNOSIS — R413 Other amnesia: Secondary | ICD-10-CM | POA: Diagnosis not present

## 2017-06-27 DIAGNOSIS — I25119 Atherosclerotic heart disease of native coronary artery with unspecified angina pectoris: Secondary | ICD-10-CM | POA: Diagnosis not present

## 2017-06-27 DIAGNOSIS — J449 Chronic obstructive pulmonary disease, unspecified: Secondary | ICD-10-CM | POA: Diagnosis not present

## 2017-06-27 DIAGNOSIS — I10 Essential (primary) hypertension: Secondary | ICD-10-CM | POA: Diagnosis not present

## 2017-06-27 DIAGNOSIS — J9611 Chronic respiratory failure with hypoxia: Secondary | ICD-10-CM | POA: Diagnosis not present

## 2017-07-05 ENCOUNTER — Telehealth: Payer: Self-pay

## 2017-07-05 NOTE — Telephone Encounter (Signed)
[phone]  OR  [phone] PATIENT RECEIVED LETTER TO SCHEDULE TCS

## 2017-07-07 ENCOUNTER — Telehealth: Payer: Self-pay

## 2017-07-07 ENCOUNTER — Ambulatory Visit (INDEPENDENT_AMBULATORY_CARE_PROVIDER_SITE_OTHER): Payer: Medicare HMO | Admitting: Otolaryngology

## 2017-07-07 DIAGNOSIS — K219 Gastro-esophageal reflux disease without esophagitis: Secondary | ICD-10-CM | POA: Diagnosis not present

## 2017-07-07 DIAGNOSIS — R070 Pain in throat: Secondary | ICD-10-CM | POA: Diagnosis not present

## 2017-07-07 NOTE — Telephone Encounter (Signed)
Opened in error

## 2017-07-07 NOTE — Telephone Encounter (Signed)
PT has been scheduled an OV with Neil Crouch, PA for 08/23/2017 at 11:30 AM, for hemorrhoids and rectal bleeding, prior to scheduling a colonoscopy.

## 2017-07-08 DIAGNOSIS — G4710 Hypersomnia, unspecified: Secondary | ICD-10-CM | POA: Diagnosis not present

## 2017-07-08 DIAGNOSIS — G4733 Obstructive sleep apnea (adult) (pediatric): Secondary | ICD-10-CM | POA: Diagnosis not present

## 2017-07-11 ENCOUNTER — Ambulatory Visit (INDEPENDENT_AMBULATORY_CARE_PROVIDER_SITE_OTHER): Payer: Medicare HMO | Admitting: "Endocrinology

## 2017-07-11 ENCOUNTER — Encounter: Payer: Self-pay | Admitting: "Endocrinology

## 2017-07-11 VITALS — BP 142/80 | HR 72 | Ht 71.0 in | Wt 286.0 lb

## 2017-07-11 DIAGNOSIS — I10 Essential (primary) hypertension: Secondary | ICD-10-CM

## 2017-07-11 DIAGNOSIS — E782 Mixed hyperlipidemia: Secondary | ICD-10-CM | POA: Diagnosis not present

## 2017-07-11 DIAGNOSIS — E1159 Type 2 diabetes mellitus with other circulatory complications: Secondary | ICD-10-CM

## 2017-07-11 DIAGNOSIS — R69 Illness, unspecified: Secondary | ICD-10-CM | POA: Diagnosis not present

## 2017-07-11 NOTE — Patient Instructions (Signed)

## 2017-07-11 NOTE — Progress Notes (Signed)
Subjective:    Patient ID: [name], male    DOB: 1960-11-02.  he is being seen in consultation for management of currently uncontrolled symptomatic diabetes requested by  Sharilyn Sites, MD.   Past Medical History:  Diagnosis Date  . Abnormal myocardial perfusion study 01/01/2011   there a small to moderate sized inferobasal scar  . Barrett's esophagus   . CAD (coronary artery disease) 2008   bypass grafting   . Cataracts, bilateral   . Chronic kidney disease    hx of kidney stones  . Claudication ([place]) 11/16/2011   PV test perform shows normal  . COPD (chronic obstructive pulmonary disease) ([place])   . Depression   . Diabetes ([place])    type 2 diabetes mellitus  . Dysrhythmia   . GERD (gastroesophageal reflux disease)   . Glaucoma   . Hernia of abdominal wall   . HTN (hypertension)   . Hyperlipidemia   . Morbid obesity ([place])   . Myocardial infarction ([place]) 2008,2009,2009  . OSA (obstructive sleep apnea)    on cpap  . PAF (paroxysmal atrial fibrillation) ([place])   . S/P colonoscopy 2009   3-4 mm transverse colon erosions likely secondary to  ASA  . S/P endoscopy Dec 2011   moderate erosive gastritis, Barrett's esophagus 1-2cm  . Sleep apnea   . SOB (shortness of breath) 11/03/2007   2D Echo EF 50%-55%  . Stroke [facility])    Past Surgical History:  Procedure Laterality Date  . BIOPSY N/A 04/29/2015   Procedure: BIOPSY;  Surgeon: Danie Binder, MD;  Location: [facility];  Service: Endoscopy;  Laterality: N/A;  . CABG X 4  03/2008  . CARDIAC CATHETERIZATION  2009   stent placement to the left circumflex a 2.25   . CARDIAC CATHETERIZATION  07/08/2010  . CORONARY ARTERY BYPASS GRAFT  2008   4 vessels  . CORONARY STENT PLACEMENT  12/29/12  . CORONARY STENT PLACEMENT  12/2012  . ESOPHAGEAL DILATION N/A 04/29/2015   Procedure: ESOPHAGEAL DILATION 15 mm, 16 mm;  Surgeon: Danie Binder, MD;  Location: [facility];  Service: Endoscopy;  Laterality: N/A;  .  ESOPHAGOGASTRODUODENOSCOPY  09/2011   Barrett's esophagus, no dysplasia on biopsies. Distal esophagitis. Status post dilation. Moderate gastritis and duodenitis, but biopsies benign. Next EGD in November 2015 for surveillance of Barrett's esophagus.  . ESOPHAGOGASTRODUODENOSCOPY (EGD) WITH PROPOFOL N/A 04/29/2015   SLF: 1. Barretts esophagus 2. Moderate non-erosive gastritis.   Marland Kitchen HERNIA REPAIR     ventral hernia repair  . LEFT HEART CATHETERIZATION WITH CORONARY/GRAFT ANGIOGRAM N/A 12/29/2012   Procedure: LEFT HEART CATHETERIZATION WITH Beatrix Fetters;  Surgeon: Troy Sine, MD;  Location: [facility] CATH LAB;  Service: Cardiovascular;  Laterality: N/A;  . ORIF FIBULA FRACTURE Right 06/03/2015   Procedure: OPEN REDUCTION INTERNAL FIXATION (ORIF) DISTAL FIBULA  FRACTURE;  Surgeon: Garald Balding, MD;  Location: [facility];  Service: Orthopedics;  Laterality: Right;  . PERCUTANEOUS CORONARY STENT INTERVENTION (PCI-S)  12/29/2012   Procedure: PERCUTANEOUS CORONARY STENT INTERVENTION (PCI-S);  Surgeon: Troy Sine, MD;  Location: [facility] C[facility] LAB;  Service: Cardiovascular;;  . SAVORY DILATION  09/06/2011   Procedure: SAVORY DILATION;  Surgeon: Dorothyann Peng, MD;  Location: [facility];  Service: Endoscopy;  Laterality: N/A;  Dilated with 75mm   Social History   Social History  . Marital status: Divorced    Spouse name: N/A  . Number of children: N/A  . Years of education: N/A  Social History Main Topics  . Smoking status: Current Every Day Smoker    Packs/day: 0.50    Years: 30.00    Types: Cigarettes  . Smokeless tobacco: Never Used  . Alcohol use Yes     Comment: every other day, one drink of beer or liquor  . Drug use: No  . Sexual activity: No   Other Topics Concern  . None   Social History Narrative  . None   Outpatient Encounter Prescriptions as of 07/11/2017  Medication Sig  . apixaban (ELIQUIS) 5 MG TABS tablet Take 1 tablet (5 mg total) by mouth 2 (two) times daily.  . ARNUITY  ELLIPTA 100 MCG/ACT AEPB INL 1 PUFF PO QD  . Choline Fenofibrate (TRILIPIX) 135 MG capsule Take 135 mg by mouth at bedtime.   . diazepam (VALIUM) 5 MG tablet Take 5 mg by mouth 2 (two) times daily.  Marland Kitchen escitalopram (LEXAPRO) 10 MG tablet Take 10 mg by mouth daily.  . Fluticasone-Umeclidin-Vilant (TRELEGY ELLIPTA) 100-62.5-25 MCG/INH AEPB Inhale into the lungs.  . furosemide (LASIX) 80 MG tablet Take 80 mg by mouth daily.  . isosorbide mononitrate (IMDUR) 60 MG 24 hr tablet Take 1 tablet (60 mg total) by mouth 3 (three) times daily. Please make an appointment for additional refills (Patient taking differently: Take 60 mg by mouth 3 (three) times daily. )  . lisinopril (PRINIVIL,ZESTRIL) 5 MG tablet Take 5 mg by mouth daily.  . metoprolol tartrate (LOPRESSOR) 100 MG tablet Take 1 tablet (100 mg total) by mouth 2 (two) times daily.  . modafinil (PROVIGIL) 100 MG tablet Take 100 mg by mouth 2 (two) times daily as needed (shortness of breath).   . nitroGLYCERIN (NITROLINGUAL) 0.4 MG/SPRAY spray Place 1 spray under the tongue every 5 (five) minutes x 3 doses as needed for chest pain.  . pantoprazole (PROTONIX) 40 MG tablet TAKE 1 TABLET BY MOUTH 30 MINUTES BEFORE BREAKFAST  . potassium chloride SA (K-DUR,KLOR-CON) 20 MEQ tablet Take 1 tablet (20 mEq total) by mouth 2 (two) times daily.  . ranitidine (ZANTAC) 150 MG capsule Take 150 mg by mouth at bedtime.  . rosuvastatin (CRESTOR) 20 MG tablet Take 20 mg by mouth daily.  . sitaGLIPtin (JANUVIA) 100 MG tablet Take 100 mg by mouth daily.   Marland Kitchen amLODipine (NORVASC) 10 MG tablet Take 1 tablet (10 mg total) by mouth daily.  . [DISCONTINUED] ANORO ELLIPTA 62.5-25 MCG/INH AEPB INL 1 PUFF PO QD  . [DISCONTINUED] budesonide-formoterol (SYMBICORT) 80-4.5 MCG/ACT inhaler Inhale 2 puffs into the lungs 2 (two) times daily.   No facility-administered encounter medications on file as of 07/11/2017.     ALLERGIES: Allergies  Allergen Reactions  . Contrast Media  [Iodinated Diagnostic Agents] Other (See Comments)    Pt must be premedicated before given contrast media - stops heart  . Iohexol Other (See Comments)     Consult with radiologist before pre meds are given.Desc: PT. STATES HEART STOPPED HAS TO BE PREMED.     VACCINATION STATUS:  There is no immunization history on file for this patient.  Diabetes  He presents for his initial diabetic visit. He has type 2 diabetes mellitus. Onset time: He was diagnosed at approximate age of 69 years. His disease course has been worsening. There are no hypoglycemic associated symptoms. Pertinent negatives for hypoglycemia include no confusion, headaches, pallor or seizures. Associated symptoms include blurred vision, polydipsia and polyuria. Pertinent negatives for diabetes include no chest pain, no fatigue, no polyphagia and no weakness.  There are no hypoglycemic complications. Symptoms are worsening. Diabetic complications include a CVA and heart disease. Risk factors for coronary artery disease include diabetes mellitus, dyslipidemia, hypertension, male sex, obesity, tobacco exposure, sedentary lifestyle and family history. Current diabetic treatment includes oral agent (monotherapy) (His only on Januvia 100 mg by mouth daily. He reports intolerance to metformin.). His weight is decreasing steadily. He is following a generally unhealthy diet. When asked about meal planning, he reported none. He has not had a previous visit with a dietitian. He never participates in exercise. (He did not bring any meter nor logs to review today. He admits that he does not monitor blood glucose regularly.) An ACE inhibitor/angiotensin II receptor blocker is being taken. He does not see a podiatrist.Eye exam is not current.  Hyperlipidemia  This is a chronic problem. The current episode started more than 1 year ago. The problem is uncontrolled. Recent lipid tests were reviewed and are high. Exacerbating diseases include diabetes and  obesity. Pertinent negatives include no chest pain, myalgias or shortness of breath. Current antihyperlipidemic treatment includes statins. Risk factors for coronary artery disease include dyslipidemia, diabetes mellitus, hypertension, male sex, obesity, family history and a sedentary lifestyle.  Hypertension  This is a chronic problem. The current episode started more than 1 year ago. The problem is uncontrolled. Associated symptoms include blurred vision. Pertinent negatives include no chest pain, headaches, neck pain, palpitations or shortness of breath. Risk factors for coronary artery disease include diabetes mellitus, dyslipidemia, male gender, obesity, family history, sedentary lifestyle and smoking/tobacco exposure. Past treatments include ACE inhibitors. Hypertensive end-organ damage includes CVA.       Review of Systems  Constitutional: Negative for chills, fatigue, fever and unexpected weight change.  HENT: Negative for dental problem, mouth sores and trouble swallowing.   Eyes: Positive for blurred vision. Negative for visual disturbance.  Respiratory: Negative for cough, choking, chest tightness, shortness of breath and wheezing.   Cardiovascular: Negative for chest pain, palpitations and leg swelling.  Gastrointestinal: Negative for abdominal distention, abdominal pain, constipation, diarrhea, nausea and vomiting.  Endocrine: Positive for polydipsia and polyuria. Negative for polyphagia.  Genitourinary: Negative for dysuria, flank pain, hematuria and urgency.  Musculoskeletal: Negative for back pain, gait problem, myalgias and neck pain.  Skin: Negative for pallor, rash and wound.  Neurological: Negative for seizures, syncope, weakness, numbness and headaches.  Psychiatric/Behavioral: Negative.  Negative for confusion and dysphoric mood.    Objective:    BP (!) 142/80   Pulse 72   Ht 5\' 11"  (1.803 m)   Wt 286 lb (129.7 kg)   BMI 39.89 kg/m   Wt Readings from Last 3  Encounters:  07/11/17 286 lb (129.7 kg)  02/14/17 294 lb (133.4 kg)  01/11/17 296 lb (134.3 kg)     Physical Exam  Constitutional: He is oriented to person, place, and time. He appears well-developed. He is cooperative. No distress.  HENT:  Head: Normocephalic and atraumatic.  Eyes: EOM are normal.  Neck: Normal range of motion. Neck supple. No tracheal deviation present. No thyromegaly present.  Cardiovascular: Normal rate, S1 normal, S2 normal and normal heart sounds.  Exam reveals no gallop.   No murmur heard. Pulses:      Dorsalis pedis pulses are 1+ on the right side, and 1+ on the left side.       Posterior tibial pulses are 1+ on the right side, and 1+ on the left side.  Pulmonary/Chest: Breath sounds normal. No respiratory distress. He has no  wheezes.  Abdominal: Soft. Bowel sounds are normal. He exhibits no distension. There is no tenderness. There is no guarding and no CVA tenderness.  Musculoskeletal: He exhibits no edema.       Right shoulder: He exhibits no swelling and no deformity.  Neurological: He is alert and oriented to person, place, and time. He has normal strength and normal reflexes. No cranial nerve deficit or sensory deficit. Gait normal.  Skin: Skin is warm and dry. No rash noted. No cyanosis. Nails show no clubbing.  Psychiatric: His speech is normal. Cognition and memory are normal.  Reluctant affect. Unconcerned attitude.   CMP ( most recent) CMP     Component Value Date/Time   NA 136 08/23/2016 1531   K 3.9 08/23/2016 1531   CL 101 08/23/2016 1531   CO2 26 08/23/2016 1531   GLUCOSE 164 (H) 08/23/2016 1531   BUN 7 02/18/2017   CREATININE 0.7 02/18/2017   CREATININE 0.97 08/23/2016 1531   CALCIUM 9.6 08/23/2016 1531   PROT 6.4 (L) 06/02/2015 1526   ALBUMIN 3.4 (L) 06/02/2015 1526   AST 28 06/02/2015 1526   ALT 31 06/02/2015 1526   ALKPHOS 65 06/02/2015 1526   BILITOT 0.7 06/02/2015 1526   GFRNONAA >60 08/23/2016 1531   GFRAA >60 08/23/2016  1531   Diabetic Labs (most recent): Lab Results  Component Value Date   HGBA1C 9.4 05/23/2017   HGBA1C 10.6 02/18/2017   HGBA1C 6.7 (H) 06/02/2015     Lipid Panel ( most recent) Lipid Panel     Component Value Date/Time   CHOL 136 02/18/2017   TRIG 559 (A) 02/18/2017   HDL 28 (A) 02/18/2017   CHOLHDL 6.1 04/28/2014 0121   VLDL 61 (H) 04/28/2014 0121   LDLCALC 47 04/28/2014 0121     Assessment & Plan:   1. DM type 2 causing vascular disease ([place])  - Patient has currently uncontrolled symptomatic type 2 DM since  56 years of age,  with most recent A1c of 9.4 %, prior to which he was 10.6% on 02/18/2017.  Recent labs reviewed.  -his diabetes is complicated by coronary artery disease, CVA, obesity/sedentary life, chronic heavy smoking and [name] remains at a high risk for more acute and chronic complications which include CAD, CVA, CKD, retinopathy, and neuropathy. These are all discussed in detail with the patient.  - I have counseled him on diet management and weight loss, by adopting a carbohydrate restricted/protein rich diet.  - Suggestion is made for him to avoid simple carbohydrates  from his diet including Cakes, Sweet Desserts, Ice Cream, Soda (diet and regular), Sweet Tea, Candies, Chips, Cookies, Store Bought Juices, Alcohol in Excess of  1-2 drinks a day, Artificial Sweeteners, and "Sugar-free" Products. This will help patient to have stable blood glucose profile and potentially avoid unintended weight gain.  - I encouraged him to switch to  unprocessed or minimally processed complex starch and increased protein intake (animal or plant source), fruits, and vegetables.  - he is advised to stick to a routine mealtimes to eat 3 meals  a day and avoid unnecessary snacks ( to snack only to correct hypoglycemia).   - he will be scheduled with Jearld Fenton, RDN, CDE for individualized DM education.  - I have approached him with the following individualized plan  to manage diabetes and patient agrees:   - This patient would likely require insulin treatment to achieve control of his diabetes to target. However, it is essential to assure  his commitment for proper monitoring of blood glucose for safe use of insulin. - I have advised him to start monitoring blood glucose 4 times a day-before meals and at bedtime and return in 1 week with his meter and logs for reevaluation.  - In the meantime, I have advised him to continue Januvia 100 mg by mouth daily. - He reports intolerance to metformin causing GI side effects.  -Patient is encouraged to call clinic for blood glucose levels less than 70 or above 300 mg /dl.  - Patient specific target  A1c;  LDL, HDL, Triglycerides, and  Waist Circumference were discussed in detail.  2) BP/HTN: Uncontrolled. Continue current medications including ACEI/ARB. 3) Lipids/HPL:   Uncontrolled with severe hypertriglyceridemia   of 559.   Patient is advised to continue statins. 4)  Weight/Diet: CDE Consult will be initiated , exercise, and detailed carbohydrates information provided.  5) Chronic Care/Health Maintenance:  -he  is on ACEI/ARB and Statin medications and  is encouraged to continue to follow up with Ophthalmology, Dentist,  Podiatrist at least yearly or according to recommendations, and advised to  quit smoking. I have recommended yearly flu vaccine and pneumonia vaccination at least every 5 years; moderate intensity exercise for up to 150 minutes weekly; and  sleep for at least 7 hours a day.  - Time spent with the patient: 1 hour, of which >50% was spent in obtaining information about his symptoms, reviewing his previous labs, evaluations, and treatments, counseling him about his currently uncontrolled, because type 2 diabetes, hyperlipidemia, and hypertension and developing a plan for long term treatment.  - Patient to bring meter and  blood glucose logs during his next visit.  - I advised patient to maintain  close follow up with Sharilyn Sites, MD for primary care needs.  Follow up plan: - Return in about 1 week (around 07/18/2017) for follow up with meter and logs- no labs.  Glade Lloyd, MD Phone: [phone]  Fax: [phone]   07/11/2017, 2:38 PM This note was partially dictated with voice recognition software. Similar sounding words can be transcribed inadequately or may not  be corrected upon review.

## 2017-07-18 ENCOUNTER — Ambulatory Visit (INDEPENDENT_AMBULATORY_CARE_PROVIDER_SITE_OTHER): Payer: Medicare HMO | Admitting: "Endocrinology

## 2017-07-18 ENCOUNTER — Encounter: Payer: Self-pay | Admitting: "Endocrinology

## 2017-07-18 VITALS — BP 124/75 | HR 80 | Ht 71.0 in | Wt 280.0 lb

## 2017-07-18 DIAGNOSIS — I10 Essential (primary) hypertension: Secondary | ICD-10-CM

## 2017-07-18 DIAGNOSIS — E782 Mixed hyperlipidemia: Secondary | ICD-10-CM | POA: Diagnosis not present

## 2017-07-18 DIAGNOSIS — E1159 Type 2 diabetes mellitus with other circulatory complications: Secondary | ICD-10-CM | POA: Diagnosis not present

## 2017-07-18 MED ORDER — INSULIN PEN NEEDLE 31G X 8 MM MISC: 1.0000 | 3 refills | Status: DC

## 2017-07-18 MED ORDER — INSULIN DEGLUDEC 200 UNIT/ML ~~LOC~~ SOPN: 28.0000 [IU] | PEN_INJECTOR | Freq: Every day | SUBCUTANEOUS | 2 refills | Status: DC

## 2017-07-18 NOTE — Patient Instructions (Signed)

## 2017-07-18 NOTE — Progress Notes (Signed)
Subjective:    Patient ID: [name], male    DOB: 09/21/1961.  [name] is being seen in consultation for management of currently uncontrolled symptomatic diabetes requested by  Sharilyn Sites, MD.   Past Medical History:  Diagnosis Date  . Abnormal myocardial perfusion study 01/01/2011   there a small to moderate sized inferobasal scar  . Barrett's esophagus   . CAD (coronary artery disease) 2008   bypass grafting   . Cataracts, bilateral   . Chronic kidney disease    hx of kidney stones  . Claudication ([place]) 11/16/2011   PV test perform shows normal  . COPD (chronic obstructive pulmonary disease) ([place])   . Depression   . Diabetes ([place])    type 2 diabetes mellitus  . Dysrhythmia   . GERD (gastroesophageal reflux disease)   . Glaucoma   . Hernia of abdominal wall   . HTN (hypertension)   . Hyperlipidemia   . Morbid obesity ([place])   . Myocardial infarction ([place]) 2008,2009,2009  . OSA (obstructive sleep apnea)    on cpap  . PAF (paroxysmal atrial fibrillation) ([place])   . S/P colonoscopy 2009   3-4 mm transverse colon erosions likely secondary to  ASA  . S/P endoscopy Dec 2011   moderate erosive gastritis, Barrett's esophagus 1-2cm  . Sleep apnea   . SOB (shortness of breath) 11/03/2007   2D Echo EF 50%-55%  . Stroke [facility])    Past Surgical History:  Procedure Laterality Date  . BIOPSY N/A 04/29/2015   Procedure: BIOPSY;  Surgeon: Danie Binder, MD;  Location: [facility];  Service: Endoscopy;  Laterality: N/A;  . CABG X 4  03/2008  . CARDIAC CATHETERIZATION  2009   stent placement to the left circumflex a 2.25   . CARDIAC CATHETERIZATION  07/08/2010  . CORONARY ARTERY BYPASS GRAFT  2008   4 vessels  . CORONARY STENT PLACEMENT  12/29/12  . CORONARY STENT PLACEMENT  12/2012  . ESOPHAGEAL DILATION N/A 04/29/2015   Procedure: ESOPHAGEAL DILATION 15 mm, 16 mm;  Surgeon: Danie Binder, MD;  Location: [facility];  Service: Endoscopy;  Laterality: N/A;  .  ESOPHAGOGASTRODUODENOSCOPY  09/2011   Barrett's esophagus, no dysplasia on biopsies. Distal esophagitis. Status post dilation. Moderate gastritis and duodenitis, but biopsies benign. Next EGD in November 2015 for surveillance of Barrett's esophagus.  . ESOPHAGOGASTRODUODENOSCOPY (EGD) WITH PROPOFOL N/A 04/29/2015   SLF: 1. Barretts esophagus 2. Moderate non-erosive gastritis.   Marland Kitchen HERNIA REPAIR     ventral hernia repair  . LEFT HEART CATHETERIZATION WITH CORONARY/GRAFT ANGIOGRAM N/A 12/29/2012   Procedure: LEFT HEART CATHETERIZATION WITH Beatrix Fetters;  Surgeon: Troy Sine, MD;  Location: [facility] CATH LAB;  Service: Cardiovascular;  Laterality: N/A;  . ORIF FIBULA FRACTURE Right 06/03/2015   Procedure: OPEN REDUCTION INTERNAL FIXATION (ORIF) DISTAL FIBULA  FRACTURE;  Surgeon: Garald Balding, MD;  Location: [facility];  Service: Orthopedics;  Laterality: Right;  . PERCUTANEOUS CORONARY STENT INTERVENTION (PCI-S)  12/29/2012   Procedure: PERCUTANEOUS CORONARY STENT INTERVENTION (PCI-S);  Surgeon: Troy Sine, MD;  Location: [facility] CATH LAB;  Service: Cardiovascular;;  . SAVORY DILATION  09/06/2011   Procedure: SAVORY DILATION;  Surgeon: Dorothyann Peng, MD;  Location: [facility];  Service: Endoscopy;  Laterality: N/A;  Dilated with 36mm   Social History   Social History  . Marital status: Divorced    Spouse name: N/A  . Number of children: N/A  . Years of education: N/A  Social History Main Topics  . Smoking status: Current Every Day Smoker    Packs/day: 0.50    Years: 30.00    Types: Cigarettes  . Smokeless tobacco: Never Used  . Alcohol use Yes     Comment: every other day, one drink of beer or liquor  . Drug use: No  . Sexual activity: No   Other Topics Concern  . None   Social History Narrative  . None   Outpatient Encounter Prescriptions as of 07/18/2017  Medication Sig  . amLODipine (NORVASC) 10 MG tablet Take 1 tablet (10 mg total) by mouth daily.  Marland Kitchen apixaban (ELIQUIS) 5 MG  TABS tablet Take 1 tablet (5 mg total) by mouth 2 (two) times daily.  . ARNUITY ELLIPTA 100 MCG/ACT AEPB INL 1 PUFF PO QD  . Choline Fenofibrate (TRILIPIX) 135 MG capsule Take 135 mg by mouth at bedtime.   . diazepam (VALIUM) 5 MG tablet Take 5 mg by mouth 2 (two) times daily.  Marland Kitchen escitalopram (LEXAPRO) 10 MG tablet Take 10 mg by mouth daily.  . Fluticasone-Umeclidin-Vilant (TRELEGY ELLIPTA) 100-62.5-25 MCG/INH AEPB Inhale into the lungs.  . furosemide (LASIX) 80 MG tablet Take 80 mg by mouth daily.  . isosorbide mononitrate (IMDUR) 60 MG 24 hr tablet Take 1 tablet (60 mg total) by mouth 3 (three) times daily. Please make an appointment for additional refills (Patient taking differently: Take 60 mg by mouth 3 (three) times daily. )  . lisinopril (PRINIVIL,ZESTRIL) 5 MG tablet Take 5 mg by mouth daily.  . metoprolol tartrate (LOPRESSOR) 100 MG tablet Take 1 tablet (100 mg total) by mouth 2 (two) times daily.  . modafinil (PROVIGIL) 100 MG tablet Take 100 mg by mouth 2 (two) times daily as needed (shortness of breath).   . nitroGLYCERIN (NITROLINGUAL) 0.4 MG/SPRAY spray Place 1 spray under the tongue every 5 (five) minutes x 3 doses as needed for chest pain.  . pantoprazole (PROTONIX) 40 MG tablet TAKE 1 TABLET BY MOUTH 30 MINUTES BEFORE BREAKFAST  . potassium chloride SA (K-DUR,KLOR-CON) 20 MEQ tablet Take 1 tablet (20 mEq total) by mouth 2 (two) times daily.  . ranitidine (ZANTAC) 150 MG capsule Take 150 mg by mouth at bedtime.  . rosuvastatin (CRESTOR) 20 MG tablet Take 20 mg by mouth daily.  . sitaGLIPtin (JANUVIA) 100 MG tablet Take 100 mg by mouth daily.    No facility-administered encounter medications on file as of 07/18/2017.     ALLERGIES: Allergies  Allergen Reactions  . Contrast Media [Iodinated Diagnostic Agents] Other (See Comments)    Pt must be premedicated before given contrast media - stops heart  . Iohexol Other (See Comments)     Consult with radiologist before pre meds are  given.Desc: PT. STATES HEART STOPPED HAS TO BE PREMED.     VACCINATION STATUS:  There is no immunization history on file for this patient.  Diabetes  [name] presents for his follow-up diabetic visit. [name] has type 2 diabetes mellitus. Onset time: [name] was diagnosed at approximate age of 44 years. His disease course has been worsening. There are no hypoglycemic associated symptoms. Pertinent negatives for hypoglycemia include no confusion, headaches, pallor or seizures. Associated symptoms include blurred vision, polydipsia and polyuria. Pertinent negatives for diabetes include no chest pain, no fatigue, no polyphagia and no weakness. There are no hypoglycemic complications. Symptoms are worsening. Diabetic complications include a CVA and heart disease. Risk factors for coronary artery disease include diabetes mellitus, dyslipidemia, hypertension, male sex, obesity, tobacco  exposure, sedentary lifestyle and family history. Current diabetic treatment includes oral agent (monotherapy) (His only on Januvia 100 mg by mouth daily. [name] reports intolerance to metformin.). His weight is decreasing steadily. [name] is following a generally unhealthy diet. When asked about meal planning, [name] reported none. [name] has not had a previous visit with a dietitian. [name] never participates in exercise. His breakfast blood glucose range is generally >200 mg/dl. His lunch blood glucose range is generally >200 mg/dl. His dinner blood glucose range is generally >200 mg/dl. His bedtime blood glucose range is generally >200 mg/dl. His overall blood glucose range is >200 mg/dl. ([name] came with 24 readings in the last 7 days averaging 255. ) An ACE inhibitor/angiotensin II receptor blocker is being taken. [name] does not see a podiatrist.Eye exam is not current.  Hyperlipidemia  This is a chronic problem. The current episode started more than 1 year ago. The problem is uncontrolled. Recent lipid tests were reviewed and are high. Exacerbating diseases  include diabetes and obesity. Pertinent negatives include no chest pain, myalgias or shortness of breath. Current antihyperlipidemic treatment includes statins. Risk factors for coronary artery disease include dyslipidemia, diabetes mellitus, hypertension, male sex, obesity, family history and a sedentary lifestyle.  Hypertension  This is a chronic problem. The current episode started more than 1 year ago. The problem is uncontrolled. Associated symptoms include blurred vision. Pertinent negatives include no chest pain, headaches, neck pain, palpitations or shortness of breath. Risk factors for coronary artery disease include diabetes mellitus, dyslipidemia, male gender, obesity, family history, sedentary lifestyle and smoking/tobacco exposure. Past treatments include ACE inhibitors. Hypertensive end-organ damage includes CVA.    Review of Systems  Constitutional: Negative for chills, fatigue, fever and unexpected weight change.  HENT: Negative for dental problem, mouth sores and trouble swallowing.   Eyes: Positive for blurred vision. Negative for visual disturbance.  Respiratory: Negative for cough, choking, chest tightness, shortness of breath and wheezing.   Cardiovascular: Negative for chest pain, palpitations and leg swelling.  Gastrointestinal: Negative for abdominal distention, abdominal pain, constipation, diarrhea, nausea and vomiting.  Endocrine: Positive for polydipsia and polyuria. Negative for polyphagia.  Genitourinary: Negative for dysuria, flank pain, hematuria and urgency.  Musculoskeletal: Negative for back pain, gait problem, myalgias and neck pain.  Skin: Negative for pallor, rash and wound.  Neurological: Negative for seizures, syncope, weakness, numbness and headaches.  Psychiatric/Behavioral: Negative.  Negative for confusion and dysphoric mood.    Objective:    BP 124/75   Pulse 80   Ht 5\' 11"  (1.803 m)   Wt 280 lb (127 kg)   BMI 39.05 kg/m   Wt Readings from Last  3 Encounters:  07/18/17 280 lb (127 kg)  07/11/17 286 lb (129.7 kg)  02/14/17 294 lb (133.4 kg)     Physical Exam  Constitutional: [name] is oriented to person, place, and time. [name] appears well-developed. [name] is cooperative. No distress.  HENT:  Head: Normocephalic and atraumatic.  Eyes: EOM are normal.  Neck: Normal range of motion. Neck supple. No tracheal deviation present. No thyromegaly present.  Cardiovascular: Normal rate, S1 normal, S2 normal and normal heart sounds.  Exam reveals no gallop.   No murmur heard. Pulses:      Dorsalis pedis pulses are 1+ on the right side, and 1+ on the left side.       Posterior tibial pulses are 1+ on the right side, and 1+ on the left side.  Pulmonary/Chest: Breath sounds normal. No respiratory distress. [name] has  no wheezes.  Abdominal: Soft. Bowel sounds are normal. [name] exhibits no distension. There is no tenderness. There is no guarding and no CVA tenderness.  Musculoskeletal: [name] exhibits no edema.       Right shoulder: [name] exhibits no swelling and no deformity.  Neurological: [name] is alert and oriented to person, place, and time. [name] has normal strength and normal reflexes. No cranial nerve deficit or sensory deficit. Gait normal.  Skin: Skin is warm and dry. No rash noted. No cyanosis. Nails show no clubbing.  Psychiatric: His speech is normal. Cognition and memory are normal.  Reluctant affect. Unconcerned attitude.   CMP ( most recent) CMP     Component Value Date/Time   NA 136 08/23/2016 1531   K 3.9 08/23/2016 1531   CL 101 08/23/2016 1531   CO2 26 08/23/2016 1531   GLUCOSE 164 (H) 08/23/2016 1531   BUN 7 02/18/2017   CREATININE 0.7 02/18/2017   CREATININE 0.97 08/23/2016 1531   CALCIUM 9.6 08/23/2016 1531   PROT 6.4 (L) 06/02/2015 1526   ALBUMIN 3.4 (L) 06/02/2015 1526   AST 28 06/02/2015 1526   ALT 31 06/02/2015 1526   ALKPHOS 65 06/02/2015 1526   BILITOT 0.7 06/02/2015 1526   GFRNONAA >60 08/23/2016 1531   GFRAA >60 08/23/2016  1531   Diabetic Labs (most recent): Lab Results  Component Value Date   HGBA1C 9.4 05/23/2017   HGBA1C 10.6 02/18/2017   HGBA1C 6.7 (H) 06/02/2015     Lipid Panel ( most recent) Lipid Panel     Component Value Date/Time   CHOL 136 02/18/2017   TRIG 559 (A) 02/18/2017   HDL 28 (A) 02/18/2017   CHOLHDL 6.1 04/28/2014 0121   VLDL 61 (H) 04/28/2014 0121   LDLCALC 47 04/28/2014 0121     Assessment & Plan:   1. DM type 2 causing vascular disease ([place])  - Patient has currently uncontrolled symptomatic type 2 DM since  56 years of age,  with most recent A1c of 9.4 %, prior to which [name] was 10.6% on 02/18/2017.  Recent labs reviewed.  -his diabetes is complicated by coronary artery disease, CVA, obesity/sedentary life, chronic heavy smoking and [place] remains at a high risk for more acute and chronic complications which include CAD, CVA, CKD, retinopathy, and neuropathy. These are all discussed in detail with the patient.  - I have counseled him on diet management and weight loss, by adopting a carbohydrate restricted/protein rich diet.  -Suggestion is made for him to avoid simple carbohydrates  from his diet including Cakes, Sweet Desserts, Ice Cream, Soda (diet and regular), Sweet Tea, Candies, Chips, Cookies, Store Bought Juices, Alcohol in Excess of  1-2 drinks a day, Artificial Sweeteners, and "Sugar-free" Products. This will help patient to have stable blood glucose profile and potentially avoid unintended weight gain.  - I encouraged him to switch to  unprocessed or minimally processed complex starch and increased protein intake (animal or plant source), fruits, and vegetables.  - [name] is advised to stick to a routine mealtimes to eat 3 meals  a day and avoid unnecessary snacks ( to snack only to correct hypoglycemia).   - I have approached him with the following individualized plan to manage diabetes and patient agrees:   - This patient will  need at least basal  insulin  treatment to achieve control of his diabetes to target. - [name] is too high risk to give basal/bolus insulin at this time.   - I  discussed and demonstrated insulin use in the exam room and initiated low-dose  Tresiba 28 units daily at bedtime associated with strict monitoring of blood glucose 4 times a day-before meals and at bedtime and return in 1 week with his meter and logs for reevaluation.  - I have advised him to continue Januvia 100 mg by mouth daily. - [name] reports intolerance to metformin causing GI side effects.  -Patient is encouraged to call clinic for blood glucose levels less than 70 or above 300 mg /dl.  - Patient specific target  A1c;  LDL, HDL, Triglycerides, and  Waist Circumference were discussed in detail.  2) BP/HTN: controlled. Continue current medications including ACEI/ARB. 3) Lipids/HPL:   Uncontrolled with severe hypertriglyceridemia   of 559.   Patient is advised to continue statins. 4)  Weight/Diet: CDE Consult will be initiated , exercise, and detailed carbohydrates information provided.  5) Chronic Care/Health Maintenance:  -[name]  is on ACEI/ARB and Statin medications and  is encouraged to continue to follow up with Ophthalmology, Dentist,  Podiatrist at least yearly or according to recommendations, and advised to  quit smoking. I have recommended yearly flu vaccine and pneumonia vaccination at least every 5 years; moderate intensity exercise for up to 150 minutes weekly; and  sleep for at least 7 hours a day.  - Time spent with the patient: 25 min, of which >50% was spent in reviewing his sugar logs , discussing his hypo- and hyper-glycemic episodes, reviewing his current and  previous labs and insulin doses and developing a plan to avoid hypo- and hyper-glycemia.   - Patient to bring meter and  blood glucose logs during his next visit.  - I advised patient to maintain close follow up with Sharilyn Sites, MD for primary care needs.  Follow up plan: - Return  in about 1 week (around 07/25/2017) for follow up with meter and logs- no labs.  Glade Lloyd, MD Phone: [phone]  Fax: [phone]   07/18/2017, 4:49 PM This note was partially dictated with voice recognition software. Similar sounding words can be transcribed inadequately or may not  be corrected upon review.

## 2017-07-25 ENCOUNTER — Ambulatory Visit (INDEPENDENT_AMBULATORY_CARE_PROVIDER_SITE_OTHER): Payer: Medicare HMO | Admitting: "Endocrinology

## 2017-07-25 ENCOUNTER — Encounter: Payer: Self-pay | Admitting: "Endocrinology

## 2017-07-25 VITALS — BP 133/84 | HR 80 | Ht 71.0 in | Wt 283.0 lb

## 2017-07-25 DIAGNOSIS — E1159 Type 2 diabetes mellitus with other circulatory complications: Secondary | ICD-10-CM | POA: Diagnosis not present

## 2017-07-25 DIAGNOSIS — E782 Mixed hyperlipidemia: Secondary | ICD-10-CM | POA: Diagnosis not present

## 2017-07-25 DIAGNOSIS — I10 Essential (primary) hypertension: Secondary | ICD-10-CM | POA: Diagnosis not present

## 2017-07-25 MED ORDER — INSULIN DEGLUDEC 200 UNIT/ML ~~LOC~~ SOPN: 40.0000 [IU] | PEN_INJECTOR | Freq: Every day | SUBCUTANEOUS | 2 refills | Status: DC

## 2017-07-25 NOTE — Progress Notes (Signed)
Subjective:    Patient ID: [name], male    DOB: [birth year].  he is being seen in consultation for management of currently uncontrolled symptomatic diabetes requested by  Sharilyn Sites, MD.   Past Medical History:  Diagnosis Date  . Abnormal myocardial perfusion study 01/01/2011   there a small to moderate sized inferobasal scar  . Barrett's esophagus   . CAD (coronary artery disease) 2008   bypass grafting   . Cataracts, bilateral   . Chronic kidney disease    hx of kidney stones  . Claudication ([place]) 11/16/2011   PV test perform shows normal  . COPD (chronic obstructive pulmonary disease) ([place])   . Depression   . Diabetes ([place])    type 2 diabetes mellitus  . Dysrhythmia   . GERD (gastroesophageal reflux disease)   . Glaucoma   . Hernia of abdominal wall   . HTN (hypertension)   . Hyperlipidemia   . Morbid obesity ([place])   . Myocardial infarction ([place]) 2008,2009,2009  . OSA (obstructive sleep apnea)    on cpap  . PAF (paroxysmal atrial fibrillation) ([place])   . S/P colonoscopy 2009   3-4 mm transverse colon erosions likely secondary to  ASA  . S/P endoscopy Dec 2011   moderate erosive gastritis, Barrett's esophagus 1-2cm  . Sleep apnea   . SOB (shortness of breath) 11/03/2007   2D Echo EF 50%-55%  . Stroke [facility])    Past Surgical History:  Procedure Laterality Date  . BIOPSY N/A 04/29/2015   Procedure: BIOPSY;  Surgeon: Danie Binder, MD;  Location: [facility];  Service: Endoscopy;  Laterality: N/A;  . CABG X 4  03/2008  . CARDIAC CATHETERIZATION  2009   stent placement to the left circumflex a 2.25   . CARDIAC CATHETERIZATION  07/08/2010  . CORONARY ARTERY BYPASS GRAFT  2008   4 vessels  . CORONARY STENT PLACEMENT  12/29/12  . CORONARY STENT PLACEMENT  12/2012  . ESOPHAGEAL DILATION N/A 04/29/2015   Procedure: ESOPHAGEAL DILATION 15 mm, 16 mm;  Surgeon: Danie Binder, MD;  Location: [facility];  Service: Endoscopy;  Laterality: N/A;  .  ESOPHAGOGASTRODUODENOSCOPY  09/2011   Barrett's esophagus, no dysplasia on biopsies. Distal esophagitis. Status post dilation. Moderate gastritis and duodenitis, but biopsies benign. Next EGD in November 2015 for surveillance of Barrett's esophagus.  . ESOPHAGOGASTRODUODENOSCOPY (EGD) WITH PROPOFOL N/A 04/29/2015   SLF: 1. Barretts esophagus 2. Moderate non-erosive gastritis.   Marland Kitchen HERNIA REPAIR     ventral hernia repair  . LEFT HEART CATHETERIZATION WITH CORONARY/GRAFT ANGIOGRAM N/A 12/29/2012   Procedure: LEFT HEART CATHETERIZATION WITH Beatrix Fetters;  Surgeon: Troy Sine, MD;  Location: [facility] CATH LAB;  Service: Cardiovascular;  Laterality: N/A;  . ORIF FIBULA FRACTURE Right 06/03/2015   Procedure: OPEN REDUCTION INTERNAL FIXATION (ORIF) DISTAL FIBULA  FRACTURE;  Surgeon: Garald Balding, MD;  Location: [place];  Service: Orthopedics;  Laterality: Right;  . PERCUTANEOUS CORONARY STENT INTERVENTION (PCI-S)  12/29/2012   Procedure: PERCUTANEOUS CORONARY STENT INTERVENTION (PCI-S);  Surgeon: Troy Sine, MD;  Location: [facility] CATH LAB;  Service: Cardiovascular;;  . SAVORY DILATION  09/06/2011   Procedure: SAVORY DILATION;  Surgeon: Dorothyann Peng, MD;  Location: [facility];  Service: Endoscopy;  Laterality: N/A;  Dilated with 28mm   Social History   Social History  . Marital status: Divorced    Spouse name: N/A  . Number of children: N/A  . Years of education: N/A  Social History Main Topics  . Smoking status: Current Every Day Smoker    Packs/day: 0.50    Years: 30.00    Types: Cigarettes  . Smokeless tobacco: Never Used  . Alcohol use Yes     Comment: every other day, one drink of beer or liquor  . Drug use: No  . Sexual activity: No   Other Topics Concern  . None   Social History Narrative  . None   Outpatient Encounter Prescriptions as of 07/25/2017  Medication Sig  . amLODipine (NORVASC) 10 MG tablet Take 1 tablet (10 mg total) by mouth daily.  Marland Kitchen apixaban (ELIQUIS) 5 MG  TABS tablet Take 1 tablet (5 mg total) by mouth 2 (two) times daily.  . ARNUITY ELLIPTA 100 MCG/ACT AEPB INL 1 PUFF PO QD  . Choline Fenofibrate (TRILIPIX) 135 MG capsule Take 135 mg by mouth at bedtime.   . diazepam (VALIUM) 5 MG tablet Take 5 mg by mouth 2 (two) times daily.  Marland Kitchen escitalopram (LEXAPRO) 10 MG tablet Take 10 mg by mouth daily.  . Fluticasone-Umeclidin-Vilant (TRELEGY ELLIPTA) 100-62.5-25 MCG/INH AEPB Inhale into the lungs.  . furosemide (LASIX) 80 MG tablet Take 80 mg by mouth daily.  . Insulin Degludec (TRESIBA FLEXTOUCH) 200 UNIT/ML SOPN Inject 40 Units into the skin at bedtime.  . Insulin Pen Needle (B-D ULTRAFINE III SHORT PEN) 31G X 8 MM MISC 1 each by Does not apply route as directed.  . isosorbide mononitrate (IMDUR) 60 MG 24 hr tablet Take 1 tablet (60 mg total) by mouth 3 (three) times daily. Please make an appointment for additional refills (Patient taking differently: Take 60 mg by mouth 3 (three) times daily. )  . lisinopril (PRINIVIL,ZESTRIL) 5 MG tablet Take 5 mg by mouth daily.  . metoprolol tartrate (LOPRESSOR) 100 MG tablet Take 1 tablet (100 mg total) by mouth 2 (two) times daily.  . modafinil (PROVIGIL) 100 MG tablet Take 100 mg by mouth 2 (two) times daily as needed (shortness of breath).   . nitroGLYCERIN (NITROLINGUAL) 0.4 MG/SPRAY spray Place 1 spray under the tongue every 5 (five) minutes x 3 doses as needed for chest pain.  . pantoprazole (PROTONIX) 40 MG tablet TAKE 1 TABLET BY MOUTH 30 MINUTES BEFORE BREAKFAST  . potassium chloride SA (K-DUR,KLOR-CON) 20 MEQ tablet Take 1 tablet (20 mEq total) by mouth 2 (two) times daily.  . ranitidine (ZANTAC) 150 MG capsule Take 150 mg by mouth at bedtime.  . rosuvastatin (CRESTOR) 20 MG tablet Take 20 mg by mouth daily.  . sitaGLIPtin (JANUVIA) 100 MG tablet Take 100 mg by mouth daily.   . [DISCONTINUED] Insulin Degludec (TRESIBA FLEXTOUCH) 200 UNIT/ML SOPN Inject 28 Units into the skin at bedtime.   No  facility-administered encounter medications on file as of 07/25/2017.     ALLERGIES: Allergies  Allergen Reactions  . Contrast Media [Iodinated Diagnostic Agents] Other (See Comments)    Pt must be premedicated before given contrast media - stops heart  . Iohexol Other (See Comments)     Consult with radiologist before pre meds are given.Desc: PT. STATES HEART STOPPED HAS TO BE PREMED.     VACCINATION STATUS:  There is no immunization history on file for this patient.  Diabetes  He presents for his follow-up diabetic visit. He has type 2 diabetes mellitus. Onset time: He was diagnosed at approximate age of 51 years. His disease course has been worsening. There are no hypoglycemic associated symptoms. Pertinent negatives for hypoglycemia include no  confusion, headaches, pallor or seizures. Associated symptoms include blurred vision, polydipsia and polyuria. Pertinent negatives for diabetes include no chest pain, no fatigue, no polyphagia and no weakness. There are no hypoglycemic complications. Symptoms are worsening. Diabetic complications include a CVA and heart disease. Risk factors for coronary artery disease include diabetes mellitus, dyslipidemia, hypertension, male sex, obesity, tobacco exposure, sedentary lifestyle and family history. Current diabetic treatment includes oral agent (monotherapy) (His only on Januvia 100 mg by mouth daily. He reports intolerance to metformin.). His weight is decreasing steadily. He is following a generally unhealthy diet. When asked about meal planning, he reported none. He has not had a previous visit with a dietitian. He never participates in exercise. His breakfast blood glucose range is generally >200 mg/dl. His lunch blood glucose range is generally >200 mg/dl. His dinner blood glucose range is generally >200 mg/dl. His bedtime blood glucose range is generally >200 mg/dl. His overall blood glucose range is >200 mg/dl. (He was initiated on a basal insulin  last visit, came with average blood glucose of 283 over the last 7 days, 20 readings. ) An ACE inhibitor/angiotensin II receptor blocker is being taken. He does not see a podiatrist.Eye exam is not current.  Hyperlipidemia  This is a chronic problem. The current episode started more than 1 year ago. The problem is uncontrolled. Recent lipid tests were reviewed and are high. Exacerbating diseases include diabetes and obesity. Pertinent negatives include no chest pain, myalgias or shortness of breath. Current antihyperlipidemic treatment includes statins. Risk factors for coronary artery disease include dyslipidemia, diabetes mellitus, hypertension, male sex, obesity, family history and a sedentary lifestyle.  Hypertension  This is a chronic problem. The current episode started more than 1 year ago. The problem is uncontrolled. Associated symptoms include blurred vision. Pertinent negatives include no chest pain, headaches, neck pain, palpitations or shortness of breath. Risk factors for coronary artery disease include diabetes mellitus, dyslipidemia, male gender, obesity, family history, sedentary lifestyle and smoking/tobacco exposure. Past treatments include ACE inhibitors. Hypertensive end-organ damage includes CVA.    Review of Systems  Constitutional: Negative for chills, fatigue, fever and unexpected weight change.  HENT: Negative for dental problem, mouth sores and trouble swallowing.   Eyes: Positive for blurred vision. Negative for visual disturbance.  Respiratory: Negative for cough, choking, chest tightness, shortness of breath and wheezing.   Cardiovascular: Negative for chest pain, palpitations and leg swelling.  Gastrointestinal: Negative for abdominal distention, abdominal pain, constipation, diarrhea, nausea and vomiting.  Endocrine: Positive for polydipsia and polyuria. Negative for polyphagia.  Genitourinary: Negative for dysuria, flank pain, hematuria and urgency.  Musculoskeletal:  Negative for back pain, gait problem, myalgias and neck pain.  Skin: Negative for pallor, rash and wound.  Neurological: Negative for seizures, syncope, weakness, numbness and headaches.  Psychiatric/Behavioral: Negative.  Negative for confusion and dysphoric mood.    Objective:    BP 133/84   Pulse 80   Ht 5\' 11"  (1.803 m)   Wt 283 lb (128.4 kg)   BMI 39.47 kg/m   Wt Readings from Last 3 Encounters:  07/25/17 283 lb (128.4 kg)  07/18/17 280 lb (127 kg)  07/11/17 286 lb (129.7 kg)     Physical Exam  Constitutional: He is oriented to person, place, and time. He appears well-developed. He is cooperative. No distress.  HENT:  Head: Normocephalic and atraumatic.  Eyes: EOM are normal.  Neck: Normal range of motion. Neck supple. No tracheal deviation present. No thyromegaly present.  Cardiovascular: Normal rate,  S1 normal, S2 normal and normal heart sounds.  Exam reveals no gallop.   No murmur heard. Pulses:      Dorsalis pedis pulses are 1+ on the right side, and 1+ on the left side.       Posterior tibial pulses are 1+ on the right side, and 1+ on the left side.  Pulmonary/Chest: Breath sounds normal. No respiratory distress. He has no wheezes.  Abdominal: Soft. Bowel sounds are normal. He exhibits no distension. There is no tenderness. There is no guarding and no CVA tenderness.  Musculoskeletal: He exhibits no edema.       Right shoulder: He exhibits no swelling and no deformity.  Neurological: He is alert and oriented to person, place, and time. He has normal strength and normal reflexes. No cranial nerve deficit or sensory deficit. Gait normal.  Skin: Skin is warm and dry. No rash noted. No cyanosis. Nails show no clubbing.  Psychiatric: His speech is normal. Cognition and memory are normal.  Reluctant affect. Unconcerned attitude.   CMP ( most recent) CMP     Component Value Date/Time   NA 136 08/23/2016 1531   K 3.9 08/23/2016 1531   CL 101 08/23/2016 1531   CO2 26  08/23/2016 1531   GLUCOSE 164 (H) 08/23/2016 1531   BUN 7 02/18/2017   CREATININE 0.7 02/18/2017   CREATININE 0.97 08/23/2016 1531   CALCIUM 9.6 08/23/2016 1531   PROT 6.4 (L) 06/02/2015 1526   ALBUMIN 3.4 (L) 06/02/2015 1526   AST 28 06/02/2015 1526   ALT 31 06/02/2015 1526   ALKPHOS 65 06/02/2015 1526   BILITOT 0.7 06/02/2015 1526   GFRNONAA >60 08/23/2016 1531   GFRAA >60 08/23/2016 1531   Diabetic Labs (most recent): Lab Results  Component Value Date   HGBA1C 9.4 05/23/2017   HGBA1C 10.6 02/18/2017   HGBA1C 6.7 (H) 06/02/2015     Lipid Panel ( most recent) Lipid Panel     Component Value Date/Time   CHOL 136 02/18/2017   TRIG 559 (A) 02/18/2017   HDL 28 (A) 02/18/2017   CHOLHDL 6.1 04/28/2014 0121   VLDL 61 (H) 04/28/2014 0121   LDLCALC 47 04/28/2014 0121     Assessment & Plan:   1. DM type 2 causing vascular disease ([place])  - Patient has currently uncontrolled symptomatic type 2 DM since  56 years of age,  with most recent A1c of 9.4 %, prior to which he was 10.6% on 02/18/2017.  Recent labs reviewed.  -his diabetes is complicated by coronary artery disease, CVA, obesity/sedentary life, chronic heavy smoking and TORRE [place] remains at a high risk for more acute and chronic complications which include CAD, CVA, CKD, retinopathy, and neuropathy. These are all discussed in detail with the patient.  - I have counseled him on diet management and weight loss, by adopting a carbohydrate restricted/protein rich diet.  -Suggestion is made for him to avoid simple carbohydrates  from his diet including Cakes, Sweet Desserts, Ice Cream, Soda (diet and regular), Sweet Tea, Candies, Chips, Cookies, Store Bought Juices, Alcohol in Excess of  1-2 drinks a day, Artificial Sweeteners, and "Sugar-free" Products. This will help patient to have stable blood glucose profile and potentially avoid unintended weight gain.  - I encouraged him to switch to  unprocessed or minimally  processed complex starch and increased protein intake (animal or plant source), fruits, and vegetables.  - he is advised to stick to a routine mealtimes to eat 3 meals  a day and avoid unnecessary snacks ( to snack only to correct hypoglycemia).   - I have approached him with the following individualized plan to manage diabetes and patient agrees:   - This patient will  need  Basal/bolus insulin  treatment to achieve control of his diabetes to target. - He is too high risk to give basal/bolus insulin at this time.   - I discussed and increased his Tresiba to 40 units daily at bedtime associated with strict monitoring of blood glucose 2 times a day- daily before breakfast and at bedtime and return in 2 weeks with his meter and logs.   - I have advised him to continue Januvia 100 mg by mouth daily. - He reports intolerance to metformin causing GI side effects.  -Patient is encouraged to call clinic for blood glucose levels less than 70 or above 300 mg /dl.  - Patient specific target  A1c;  LDL, HDL, Triglycerides, and  Waist Circumference were discussed in detail.  2) BP/HTN: controlled. Continue current medications including ACEI/ARB. 3) Lipids/HPL:   Uncontrolled with severe hypertriglyceridemia   of 559.   Patient is advised to continue statins. 4)  Weight/Diet: CDE Consult will be initiated , exercise, and detailed carbohydrates information provided.  5) Chronic Care/Health Maintenance:  -he  is on ACEI/ARB and Statin medications and  is encouraged to continue to follow up with Ophthalmology, Dentist,  Podiatrist at least yearly or according to recommendations, and advised to  quit smoking. I have recommended yearly flu vaccine and pneumonia vaccination at least every 5 years; moderate intensity exercise for up to 150 minutes weekly; and  sleep for at least 7 hours a day.   - Time spent with the patient: 25 min, of which >50% was spent in reviewing his sugar logs , discussing his hypo-  and hyper-glycemic episodes, reviewing his current and  previous labs and insulin doses and developing a plan to avoid hypo- and hyper-glycemia.   - Patient to bring meter and  blood glucose logs during his next visit.  - I advised patient to maintain close follow up with Sharilyn Sites, MD for primary care needs.  Follow up plan: - No Follow-up on file.  Glade Lloyd, MD Phone: [phone]  Fax: [phone]   07/25/2017, 1:40 PM This note was partially dictated with voice recognition software. Similar sounding words can be transcribed inadequately or may not  be corrected upon review.

## 2017-07-25 NOTE — Patient Instructions (Signed)

## 2017-08-08 ENCOUNTER — Other Ambulatory Visit: Payer: Self-pay

## 2017-08-08 ENCOUNTER — Ambulatory Visit (INDEPENDENT_AMBULATORY_CARE_PROVIDER_SITE_OTHER): Payer: Medicare HMO | Admitting: "Endocrinology

## 2017-08-08 ENCOUNTER — Encounter: Payer: Self-pay | Admitting: "Endocrinology

## 2017-08-08 VITALS — BP 134/81 | HR 80 | Ht 71.0 in | Wt 284.0 lb

## 2017-08-08 DIAGNOSIS — E1159 Type 2 diabetes mellitus with other circulatory complications: Secondary | ICD-10-CM

## 2017-08-08 DIAGNOSIS — E782 Mixed hyperlipidemia: Secondary | ICD-10-CM

## 2017-08-08 DIAGNOSIS — R69 Illness, unspecified: Secondary | ICD-10-CM | POA: Diagnosis not present

## 2017-08-08 DIAGNOSIS — I10 Essential (primary) hypertension: Secondary | ICD-10-CM

## 2017-08-08 MED ORDER — GLUCOSE BLOOD VI STRP: ORAL_STRIP | 5 refills | Status: DC

## 2017-08-08 MED ORDER — INSULIN DEGLUDEC 200 UNIT/ML ~~LOC~~ SOPN: 60.0000 [IU] | PEN_INJECTOR | Freq: Every day | SUBCUTANEOUS | 2 refills | Status: DC

## 2017-08-08 NOTE — Patient Instructions (Signed)

## 2017-08-08 NOTE — Progress Notes (Signed)
Subjective:    Patient ID: [name], male    DOB: Jan 13, 1961.  he is being seen in consultation for management of currently uncontrolled symptomatic diabetes requested by  Sharilyn Sites, MD.   Past Medical History:  Diagnosis Date  . Abnormal myocardial perfusion study 01/01/2011   there a small to moderate sized inferobasal scar  . Barrett's esophagus   . CAD (coronary artery disease) 2008   bypass grafting   . Cataracts, bilateral   . Chronic kidney disease    hx of kidney stones  . Claudication ([place]) 11/16/2011   PV test perform shows normal  . COPD (chronic obstructive pulmonary disease) ([place])   . Depression   . Diabetes ([place])    type 2 diabetes mellitus  . Dysrhythmia   . GERD (gastroesophageal reflux disease)   . Glaucoma   . Hernia of abdominal wall   . HTN (hypertension)   . Hyperlipidemia   . Morbid obesity ([place])   . Myocardial infarction ([place]) 2008,2009,2009  . OSA (obstructive sleep apnea)    on cpap  . PAF (paroxysmal atrial fibrillation) ([place])   . S/P colonoscopy 2009   3-4 mm transverse colon erosions likely secondary to  ASA  . S/P endoscopy Dec 2011   moderate erosive gastritis, Barrett's esophagus 1-2cm  . Sleep apnea   . SOB (shortness of breath) 11/03/2007   2D Echo EF 50%-55%  . Stroke [facility])    Past Surgical History:  Procedure Laterality Date  . BIOPSY N/A 04/29/2015   Procedure: BIOPSY;  Surgeon: Danie Binder, MD;  Location: [facility];  Service: Endoscopy;  Laterality: N/A;  . CABG X 4  03/2008  . CARDIAC CATHETERIZATION  2009   stent placement to the left circumflex a 2.25   . CARDIAC CATHETERIZATION  07/08/2010  . CORONARY ARTERY BYPASS GRAFT  2008   4 vessels  . CORONARY STENT PLACEMENT  12/29/12  . CORONARY STENT PLACEMENT  12/2012  . ESOPHAGEAL DILATION N/A 04/29/2015   Procedure: ESOPHAGEAL DILATION 15 mm, 16 mm;  Surgeon: Danie Binder, MD;  Location: [facility];  Service: Endoscopy;  Laterality: N/A;  .  ESOPHAGOGASTRODUODENOSCOPY  09/2011   Barrett's esophagus, no dysplasia on biopsies. Distal esophagitis. Status post dilation. Moderate gastritis and duodenitis, but biopsies benign. Next EGD in November 2015 for surveillance of Barrett's esophagus.  . ESOPHAGOGASTRODUODENOSCOPY (EGD) WITH PROPOFOL N/A 04/29/2015   SLF: 1. Barretts esophagus 2. Moderate non-erosive gastritis.   Marland Kitchen HERNIA REPAIR     ventral hernia repair  . LEFT HEART CATHETERIZATION WITH CORONARY/GRAFT ANGIOGRAM N/A 12/29/2012   Procedure: LEFT HEART CATHETERIZATION WITH Beatrix Fetters;  Surgeon: Troy Sine, MD;  Location: [facility] CATH LAB;  Service: Cardiovascular;  Laterality: N/A;  . ORIF FIBULA FRACTURE Right 06/03/2015   Procedure: OPEN REDUCTION INTERNAL FIXATION (ORIF) DISTAL FIBULA  FRACTURE;  Surgeon: Garald Balding, MD;  Location: [place];  Service: Orthopedics;  Laterality: Right;  . PERCUTANEOUS CORONARY STENT INTERVENTION (PCI-S)  12/29/2012   Procedure: PERCUTANEOUS CORONARY STENT INTERVENTION (PCI-S);  Surgeon: Troy Sine, MD;  Location: [facility] CATH LAB;  Service: Cardiovascular;;  . SAVORY DILATION  09/06/2011   Procedure: SAVORY DILATION;  Surgeon: Dorothyann Peng, MD;  Location: [facility];  Service: Endoscopy;  Laterality: N/A;  Dilated with 80mm   Social History   Social History  . Marital status: Divorced    Spouse name: N/A  . Number of children: N/A  . Years of education: N/A  Social History Main Topics  . Smoking status: Current Every Day Smoker    Packs/day: 0.50    Years: 30.00    Types: Cigarettes  . Smokeless tobacco: Never Used  . Alcohol use Yes     Comment: every other day, one drink of beer or liquor  . Drug use: No  . Sexual activity: No   Other Topics Concern  . None   Social History Narrative  . None   Outpatient Encounter Prescriptions as of 08/08/2017  Medication Sig  . amLODipine (NORVASC) 10 MG tablet Take 1 tablet (10 mg total) by mouth daily.  Marland Kitchen apixaban (ELIQUIS) 5 MG  TABS tablet Take 1 tablet (5 mg total) by mouth 2 (two) times daily.  . ARNUITY ELLIPTA 100 MCG/ACT AEPB INL 1 PUFF PO QD  . Choline Fenofibrate (TRILIPIX) 135 MG capsule Take 135 mg by mouth at bedtime.   . diazepam (VALIUM) 5 MG tablet Take 5 mg by mouth 2 (two) times daily.  Marland Kitchen escitalopram (LEXAPRO) 10 MG tablet Take 10 mg by mouth daily.  . Fluticasone-Umeclidin-Vilant (TRELEGY ELLIPTA) 100-62.5-25 MCG/INH AEPB Inhale into the lungs.  . furosemide (LASIX) 80 MG tablet Take 80 mg by mouth daily.  Marland Kitchen glucose blood (ONETOUCH VERIO) test strip Use as instructed bid. e11.65  . Insulin Degludec (TRESIBA FLEXTOUCH) 200 UNIT/ML SOPN Inject 60 Units into the skin at bedtime.  . Insulin Pen Needle (B-D ULTRAFINE III SHORT PEN) 31G X 8 MM MISC 1 each by Does not apply route as directed.  . isosorbide mononitrate (IMDUR) 60 MG 24 hr tablet Take 1 tablet (60 mg total) by mouth 3 (three) times daily. Please make an appointment for additional refills (Patient taking differently: Take 60 mg by mouth 3 (three) times daily. )  . lisinopril (PRINIVIL,ZESTRIL) 5 MG tablet Take 5 mg by mouth daily.  . metoprolol tartrate (LOPRESSOR) 100 MG tablet Take 1 tablet (100 mg total) by mouth 2 (two) times daily.  . modafinil (PROVIGIL) 100 MG tablet Take 100 mg by mouth 2 (two) times daily as needed (shortness of breath).   . nitroGLYCERIN (NITROLINGUAL) 0.4 MG/SPRAY spray Place 1 spray under the tongue every 5 (five) minutes x 3 doses as needed for chest pain.  . pantoprazole (PROTONIX) 40 MG tablet TAKE 1 TABLET BY MOUTH 30 MINUTES BEFORE BREAKFAST  . potassium chloride SA (K-DUR,KLOR-CON) 20 MEQ tablet Take 1 tablet (20 mEq total) by mouth 2 (two) times daily.  . ranitidine (ZANTAC) 150 MG capsule Take 150 mg by mouth at bedtime.  . rosuvastatin (CRESTOR) 20 MG tablet Take 20 mg by mouth daily.  . sitaGLIPtin (JANUVIA) 100 MG tablet Take 100 mg by mouth daily.   . [DISCONTINUED] Insulin Degludec (TRESIBA FLEXTOUCH) 200  UNIT/ML SOPN Inject 40 Units into the skin at bedtime.   No facility-administered encounter medications on file as of 08/08/2017.     ALLERGIES: Allergies  Allergen Reactions  . Contrast Media [Iodinated Diagnostic Agents] Other (See Comments)    Pt must be premedicated before given contrast media - stops heart  . Iohexol Other (See Comments)     Consult with radiologist before pre meds are given.Desc: PT. STATES HEART STOPPED HAS TO BE PREMED.     VACCINATION STATUS:  There is no immunization history on file for this patient.  Diabetes  He presents for his follow-up diabetic visit. He has type 2 diabetes mellitus. Onset time: He was diagnosed at approximate age of 30 years. His disease course has been  worsening. There are no hypoglycemic associated symptoms. Pertinent negatives for hypoglycemia include no confusion, headaches, pallor or seizures. Associated symptoms include blurred vision, polydipsia and polyuria. Pertinent negatives for diabetes include no chest pain, no fatigue, no polyphagia and no weakness. There are no hypoglycemic complications. Symptoms are worsening. Diabetic complications include a CVA and heart disease. Risk factors for coronary artery disease include diabetes mellitus, dyslipidemia, hypertension, male sex, obesity, tobacco exposure, sedentary lifestyle and family history. Current diabetic treatment includes oral agent (monotherapy) (His only on Januvia 100 mg by mouth daily. He reports intolerance to metformin.). His weight is increasing steadily. He is following a generally unhealthy diet. When asked about meal planning, he reported none. He has not had a previous visit with a dietitian. He never participates in exercise. His breakfast blood glucose range is generally >200 mg/dl. His bedtime blood glucose range is generally >200 mg/dl. His overall blood glucose range is >200 mg/dl. (He came with EAG of 288 in the last 7 days ( n=10). ) An ACE inhibitor/angiotensin II  receptor blocker is being taken. He does not see a podiatrist.Eye exam is not current.  Hyperlipidemia  This is a chronic problem. The current episode started more than 1 year ago. The problem is uncontrolled. Recent lipid tests were reviewed and are high. Exacerbating diseases include diabetes and obesity. Pertinent negatives include no chest pain, myalgias or shortness of breath. Current antihyperlipidemic treatment includes statins. Risk factors for coronary artery disease include dyslipidemia, diabetes mellitus, hypertension, male sex, obesity, family history and a sedentary lifestyle.  Hypertension  This is a chronic problem. The current episode started more than 1 year ago. The problem is uncontrolled. Associated symptoms include blurred vision. Pertinent negatives include no chest pain, headaches, neck pain, palpitations or shortness of breath. Risk factors for coronary artery disease include diabetes mellitus, dyslipidemia, male gender, obesity, family history, sedentary lifestyle and smoking/tobacco exposure. Past treatments include ACE inhibitors. Hypertensive end-organ damage includes CVA.    Review of Systems  Constitutional: Negative for chills, fatigue, fever and unexpected weight change.  HENT: Negative for dental problem, mouth sores and trouble swallowing.   Eyes: Positive for blurred vision. Negative for visual disturbance.  Respiratory: Negative for cough, choking, chest tightness, shortness of breath and wheezing.   Cardiovascular: Negative for chest pain, palpitations and leg swelling.  Gastrointestinal: Negative for abdominal distention, abdominal pain, constipation, diarrhea, nausea and vomiting.  Endocrine: Positive for polydipsia and polyuria. Negative for polyphagia.  Genitourinary: Negative for dysuria, flank pain, hematuria and urgency.  Musculoskeletal: Negative for back pain, gait problem, myalgias and neck pain.  Skin: Negative for pallor, rash and wound.   Neurological: Negative for seizures, syncope, weakness, numbness and headaches.  Psychiatric/Behavioral: Negative.  Negative for confusion and dysphoric mood.    Objective:    BP 134/81   Pulse 80   Ht 5\' 11"  (1.803 m)   Wt 284 lb (128.8 kg)   BMI 39.61 kg/m   Wt Readings from Last 3 Encounters:  08/08/17 284 lb (128.8 kg)  07/25/17 283 lb (128.4 kg)  07/18/17 280 lb (127 kg)     Physical Exam  Constitutional: He is oriented to person, place, and time. He appears well-developed. He is cooperative. No distress.  HENT:  Head: Normocephalic and atraumatic.  Eyes: EOM are normal.  Neck: Normal range of motion. Neck supple. No tracheal deviation present. No thyromegaly present.  Cardiovascular: Normal rate, S1 normal, S2 normal and normal heart sounds.  Exam reveals no gallop.  No murmur heard. Pulses:      Dorsalis pedis pulses are 1+ on the right side, and 1+ on the left side.       Posterior tibial pulses are 1+ on the right side, and 1+ on the left side.  Pulmonary/Chest: Breath sounds normal. No respiratory distress. He has no wheezes.  Abdominal: Soft. Bowel sounds are normal. He exhibits no distension. There is no tenderness. There is no guarding and no CVA tenderness.  Musculoskeletal: He exhibits no edema.       Right shoulder: He exhibits no swelling and no deformity.  Neurological: He is alert and oriented to person, place, and time. He has normal strength and normal reflexes. No cranial nerve deficit or sensory deficit. Gait normal.  Skin: Skin is warm and dry. No rash noted. No cyanosis. Nails show no clubbing.  Psychiatric: His speech is normal. Cognition and memory are normal.  Reluctant affect. Unconcerned attitude.   CMP ( most recent) CMP     Component Value Date/Time   NA 136 08/23/2016 1531   K 3.9 08/23/2016 1531   CL 101 08/23/2016 1531   CO2 26 08/23/2016 1531   GLUCOSE 164 (H) 08/23/2016 1531   BUN 7 02/18/2017   CREATININE 0.7 02/18/2017    CREATININE 0.97 08/23/2016 1531   CALCIUM 9.6 08/23/2016 1531   PROT 6.4 (L) 06/02/2015 1526   ALBUMIN 3.4 (L) 06/02/2015 1526   AST 28 06/02/2015 1526   ALT 31 06/02/2015 1526   ALKPHOS 65 06/02/2015 1526   BILITOT 0.7 06/02/2015 1526   GFRNONAA >60 08/23/2016 1531   GFRAA >60 08/23/2016 1531   Diabetic Labs (most recent): Lab Results  Component Value Date   HGBA1C 9.4 05/23/2017   HGBA1C 10.6 02/18/2017   HGBA1C 6.7 (H) 06/02/2015     Lipid Panel ( most recent) Lipid Panel     Component Value Date/Time   CHOL 136 02/18/2017   TRIG 559 (A) 02/18/2017   HDL 28 (A) 02/18/2017   CHOLHDL 6.1 04/28/2014 0121   VLDL 61 (H) 04/28/2014 0121   LDLCALC 47 04/28/2014 0121     Assessment & Plan:   1. DM type 2 causing vascular disease ([place])  - Patient has currently uncontrolled symptomatic type 2 DM since  56 years of age,  with most recent A1c of 9.4 %, prior to which he was 10.6% on 02/18/2017.  Recent labs reviewed.  -his diabetes is complicated by coronary artery disease, CVA, obesity/sedentary life, chronic heavy smoking and WINTHROP SHANNAHAN remains at a high risk for more acute and chronic complications which include CAD, CVA, CKD, retinopathy, and neuropathy. These are all discussed in detail with the patient.  - I have counseled him on diet management and weight loss, by adopting a carbohydrate restricted/protein rich diet.  -  Suggestion is made for him to avoid simple carbohydrates  from his diet including Cakes, Sweet Desserts / Pastries, Ice Cream, Soda (diet and regular), Sweet Tea, Candies, Chips, Cookies, Store Bought Juices, Alcohol in Excess of  1-2 drinks a day, Artificial Sweeteners, and "Sugar-free" Products. This will help patient to have stable blood glucose profile and potentially avoid unintended weight gain.   - I encouraged him to switch to  unprocessed or minimally processed complex starch and increased protein intake (animal or plant source), fruits,  and vegetables.  - he is advised to stick to a routine mealtimes to eat 3 meals  a day and avoid unnecessary snacks ( to snack only  to correct hypoglycemia).   - I have approached him with the following individualized plan to manage diabetes and patient agrees:   - This patient will  need  Basal/bolus insulin  treatment to achieve control of his diabetes to target.  - He is too high risk to give basal/bolus insulin at this time.   - I discussed and increased his Tresiba to 60 units daily at bedtime associated with strict monitoring of blood glucose 2 times a day- daily before breakfast and at bedtime and return in 2 weeks with his meter and logs.  - I have advised him to continue Januvia 100 mg by mouth daily. - He reports intolerance to metformin causing GI side effects.  -Patient is encouraged to call clinic for blood glucose levels less than 70 or above 300 mg /dl.  - Patient specific target  A1c;  LDL, HDL, Triglycerides, and  Waist Circumference were discussed in detail.  2) BP/HTN: controlled. Continue current medications including ACEI/ARB. 3) Lipids/HPL:   Uncontrolled with severe hypertriglyceridemia   of 559.   Patient is advised to continue statins. 4)  Weight/Diet: CDE Consult will be initiated , exercise, and detailed carbohydrates information provided.  5) Chronic Care/Health Maintenance:  -he  is on ACEI/ARB and Statin medications and  is encouraged to continue to follow up with Ophthalmology, Dentist,  Podiatrist at least yearly or according to recommendations, and advised to  quit smoking. I have recommended yearly flu vaccine and pneumonia vaccination at least every 5 years; moderate intensity exercise for up to 150 minutes weekly; and  sleep for at least 7 hours a day.  - Time spent with the patient: 25 min, of which >50% was spent in reviewing his sugar logs , discussing his hypo- and hyper-glycemic episodes, reviewing his current and  previous labs and insulin doses  and developing a plan to avoid hypo- and hyper-glycemia.   - I advised patient to maintain close follow up with Sharilyn Sites, MD for primary care needs.  Follow up plan: - Return in about 8 weeks (around 10/03/2017) for follow up with pre-visit labs, meter, and logs.  Glade Lloyd, MD Phone: [phone]  Fax: [phone]   08/08/2017, 1:49 PM   This note was partially dictated with voice recognition software. Similar sounding words can be transcribed inadequately or may not  be corrected upon review.

## 2017-08-16 DIAGNOSIS — Z951 Presence of aortocoronary bypass graft: Secondary | ICD-10-CM | POA: Diagnosis not present

## 2017-08-16 DIAGNOSIS — Z794 Long term (current) use of insulin: Secondary | ICD-10-CM | POA: Diagnosis not present

## 2017-08-16 DIAGNOSIS — Z955 Presence of coronary angioplasty implant and graft: Secondary | ICD-10-CM | POA: Diagnosis not present

## 2017-08-16 DIAGNOSIS — I10 Essential (primary) hypertension: Secondary | ICD-10-CM | POA: Diagnosis not present

## 2017-08-16 DIAGNOSIS — I480 Paroxysmal atrial fibrillation: Secondary | ICD-10-CM | POA: Diagnosis not present

## 2017-08-16 DIAGNOSIS — R0602 Shortness of breath: Secondary | ICD-10-CM | POA: Diagnosis not present

## 2017-08-16 DIAGNOSIS — E1159 Type 2 diabetes mellitus with other circulatory complications: Secondary | ICD-10-CM | POA: Diagnosis not present

## 2017-08-23 ENCOUNTER — Ambulatory Visit (INDEPENDENT_AMBULATORY_CARE_PROVIDER_SITE_OTHER): Payer: Medicare HMO | Admitting: Gastroenterology

## 2017-08-23 ENCOUNTER — Other Ambulatory Visit: Payer: Self-pay

## 2017-08-23 ENCOUNTER — Telehealth: Payer: Self-pay

## 2017-08-23 ENCOUNTER — Encounter: Payer: Self-pay | Admitting: Gastroenterology

## 2017-08-23 DIAGNOSIS — K625 Hemorrhage of anus and rectum: Secondary | ICD-10-CM | POA: Diagnosis not present

## 2017-08-23 MED ORDER — PEG 3350-KCL-NA BICARB-NACL 420 G PO SOLR: 4000.0000 mL | ORAL | 0 refills | Status: DC

## 2017-08-23 NOTE — Patient Instructions (Signed)
1. Colonoscopy as scheduled. Please see separate instructions. 

## 2017-08-23 NOTE — Progress Notes (Addendum)
REVIEWED-NO ADDITIONAL RECOMMENDATIONS.  Primary Care Physician:  Sharilyn Sites, MD  Primary Gastroenterologist:  Barney Drain, MD   Chief Complaint  Patient presents with  . Colonoscopy  . Rectal Bleeding    x years    HPI:  [name] is a 56 y.o. male here to schedule colonoscopy at request of his PCP, Dr. Hilma Favors. Patient's last colonoscopy was in 03/2008, he has small 3-4 mm ulceration noted in the transverse colon. Pathology was benign. Patient with h/o Barrett's esophagus due next EGD 2019.   He has 4-5BMs every morning. Some bleeding. Bleeding pretty regularly over the past 2-3 years. No rectal pain. No abdominal pain. He is on Eliquis. heartburn well controlled. No dysphagia.    Current Outpatient Prescriptions  Medication Sig Dispense Refill  . amLODipine (NORVASC) 10 MG tablet Take 1 tablet (10 mg total) by mouth daily. 180 tablet 3  . apixaban (ELIQUIS) 5 MG TABS tablet Take 1 tablet (5 mg total) by mouth 2 (two) times daily. 1 tablet 0  . budesonide-formoterol (SYMBICORT) 160-4.5 MCG/ACT inhaler Inhale 2 puffs into the lungs as needed.    . Choline Fenofibrate (TRILIPIX) 135 MG capsule Take 135 mg by mouth at bedtime.     . diazepam (VALIUM) 5 MG tablet Take 5 mg by mouth 2 (two) times daily.    Marland Kitchen escitalopram (LEXAPRO) 10 MG tablet Take 10 mg by mouth daily.    . Fluticasone-Umeclidin-Vilant (TRELEGY ELLIPTA) 100-62.5-25 MCG/INH AEPB Inhale into the lungs.    . furosemide (LASIX) 80 MG tablet Take 80 mg by mouth daily.  4  . glucose blood (ONETOUCH VERIO) test strip Use as instructed qid. e11.65 150 each 5  . Insulin Degludec (TRESIBA FLEXTOUCH) 200 UNIT/ML SOPN Inject 60 Units into the skin at bedtime. 3 pen 2  . Insulin Pen Needle (B-D ULTRAFINE III SHORT PEN) 31G X 8 MM MISC 1 each by Does not apply route as directed. 100 each 3  . isosorbide mononitrate (IMDUR) 60 MG 24 hr tablet Take 1 tablet (60 mg total) by mouth 3 (three) times daily. Please make an  appointment for additional refills (Patient taking differently: Take 60 mg by mouth 3 (three) times daily. ) 45 tablet 0  . lisinopril (PRINIVIL,ZESTRIL) 5 MG tablet Take 5 mg by mouth daily.    . metoprolol tartrate (LOPRESSOR) 100 MG tablet Take 1 tablet (100 mg total) by mouth 2 (two) times daily. 180 tablet 3  . modafinil (PROVIGIL) 100 MG tablet Take 100 mg by mouth 2 (two) times daily as needed (shortness of breath).     . nitroGLYCERIN (NITROLINGUAL) 0.4 MG/SPRAY spray Place 1 spray under the tongue every 5 (five) minutes x 3 doses as needed for chest pain.    . pantoprazole (PROTONIX) 40 MG tablet TAKE 1 TABLET BY MOUTH 30 MINUTES BEFORE BREAKFAST 90 tablet 3  . potassium chloride SA (K-DUR,KLOR-CON) 20 MEQ tablet Take 1 tablet (20 mEq total) by mouth 2 (two) times daily. 30 tablet 0  . rosuvastatin (CRESTOR) 20 MG tablet Take 20 mg by mouth daily.    . sitaGLIPtin (JANUVIA) 100 MG tablet Take 100 mg by mouth daily.      No current facility-administered medications for this visit.     Allergies as of 08/23/2017 - Review Complete 08/23/2017  Allergen Reaction Noted  . Contrast media [iodinated diagnostic agents] Other (See Comments) 03/14/2013  . Iohexol Other (See Comments) 01/02/2008    Past Medical History:  Diagnosis Date  . Abnormal  myocardial perfusion study 01/01/2011   there a small to moderate sized inferobasal scar  . Barrett's esophagus   . CAD (coronary artery disease) 2008   bypass grafting   . Cataracts, bilateral   . Chronic kidney disease    hx of kidney stones  . Claudication ([place]) 11/16/2011   PV test perform shows normal  . COPD (chronic obstructive pulmonary disease) ([place])   . Depression   . Diabetes ([place])    type 2 diabetes mellitus  . Dysrhythmia   . GERD (gastroesophageal reflux disease)   . Glaucoma   . Hernia of abdominal wall   . HTN (hypertension)   . Hyperlipidemia   . Morbid obesity ([place])   . Myocardial infarction ([place]) 2008,2009,2009  .  OSA (obstructive sleep apnea)    on cpap  . PAF (paroxysmal atrial fibrillation) ([place])   . S/P colonoscopy 2009   3-4 mm transverse colon erosions likely secondary to  ASA  . S/P endoscopy Dec 2011   moderate erosive gastritis, Barrett's esophagus 1-2cm  . Sleep apnea   . SOB (shortness of breath) 11/03/2007   2D Echo EF 50%-55%  . Stroke [facility])     Past Surgical History:  Procedure Laterality Date  . BIOPSY N/A 04/29/2015   Procedure: BIOPSY;  Surgeon: Danie Binder, MD;  Location: [facility];  Service: Endoscopy;  Laterality: N/A;  . CABG X 4  03/2008  . CARDIAC CATHETERIZATION  2009   stent placement to the left circumflex a 2.25   . CARDIAC CATHETERIZATION  07/08/2010  . CORONARY ARTERY BYPASS GRAFT  2008   4 vessels  . CORONARY STENT PLACEMENT  12/29/12  . CORONARY STENT PLACEMENT  12/2012  . ESOPHAGEAL DILATION N/A 04/29/2015   Procedure: ESOPHAGEAL DILATION 15 mm, 16 mm;  Surgeon: Danie Binder, MD;  Location: [facility];  Service: Endoscopy;  Laterality: N/A;  . ESOPHAGOGASTRODUODENOSCOPY  09/2011   Barrett's esophagus, no dysplasia on biopsies. Distal esophagitis. Status post dilation. Moderate gastritis and duodenitis, but biopsies benign. Next EGD in November 2015 for surveillance of Barrett's esophagus.  . ESOPHAGOGASTRODUODENOSCOPY (EGD) WITH PROPOFOL N/A 04/29/2015   SLF: 1. Barretts esophagus 2. Moderate non-erosive gastritis.   Marland Kitchen HERNIA REPAIR     ventral hernia repair  . LEFT HEART CATHETERIZATION WITH CORONARY/GRAFT ANGIOGRAM N/A 12/29/2012   Procedure: LEFT HEART CATHETERIZATION WITH Beatrix Fetters;  Surgeon: Troy Sine, MD;  Location: [facility] CATH LAB;  Service: Cardiovascular;  Laterality: N/A;  . ORIF FIBULA FRACTURE Right 06/03/2015   Procedure: OPEN REDUCTION INTERNAL FIXATION (ORIF) DISTAL FIBULA  FRACTURE;  Surgeon: Garald Balding, MD;  Location: [place];  Service: Orthopedics;  Laterality: Right;  . PERCUTANEOUS CORONARY STENT INTERVENTION (PCI-S)  12/29/2012    Procedure: PERCUTANEOUS CORONARY STENT INTERVENTION (PCI-S);  Surgeon: Troy Sine, MD;  Location: [facility] CATH LAB;  Service: Cardiovascular;;  . SAVORY DILATION  09/06/2011   Procedure: SAVORY DILATION;  Surgeon: Dorothyann Peng, MD;  Location: [facility];  Service: Endoscopy;  Laterality: N/A;  Dilated with 62mm    Family History  Problem Relation Age of Onset  . Diabetes Mother   . Heart disease Mother   . Diabetes Father   . Heart disease Father   . Colon cancer Neg Hx   . Anesthesia problems Neg Hx   . Hypotension Neg Hx   . Malignant hyperthermia Neg Hx   . Pseudochol deficiency Neg Hx     Social History   Social History  .  Marital status: Divorced    Spouse name: N/A  . Number of children: N/A  . Years of education: N/A   Occupational History  . Not on file.   Social History Main Topics  . Smoking status: Current Every Day Smoker    Packs/day: 0.50    Years: 30.00    Types: Cigarettes  . Smokeless tobacco: Never Used  . Alcohol use Yes     Comment: occ  . Drug use: No  . Sexual activity: No   Other Topics Concern  . Not on file   Social History Narrative  . No narrative on file      ROS:  General: Negative for anorexia, weight loss, fever, chills, fatigue, weakness. Eyes: Negative for vision changes.  ENT: Negative for hoarseness, difficulty swallowing , nasal congestion. CV: Negative for chest pain, angina, palpitations, +dyspnea on exertion, +peripheral edema.  Respiratory: Negative for dyspnea at rest, dyspnea on exertion, cough, sputum, wheezing.  GI: See history of present illness. GU:  Negative for dysuria, hematuria, urinary incontinence, urinary frequency, nocturnal urination.  MS: Negative for joint pain, low back pain.  Derm: Negative for rash or itching.  Neuro: Negative for weakness, abnormal sensation, seizure, frequent headaches, memory loss, confusion.  Psych: Negative for anxiety, depression, suicidal ideation, hallucinations.  Endo:  Negative for unusual weight change.  Heme: Negative for bruising or bleeding. Allergy: Negative for rash or hives.    Physical Examination:  BP 123/80   Pulse 73   Temp (!) 97 F (36.1 C) (Oral)   Ht 5\' 10"  (1.778 m)   Wt 285 lb 9.6 oz (129.5 kg)   BMI 40.98 kg/m    General: Well-nourished, well-developed in no acute distress.  Head: Normocephalic, atraumatic.   Eyes: Conjunctiva pink, no icterus. Mouth: Oropharyngeal mucosa moist and pink , no lesions erythema or exudate. Neck: Supple without thyromegaly, masses, or lymphadenopathy.  Lungs: Clear to auscultation bilaterally.  Heart: Regular rate and rhythm, no murmurs rubs or gallops.  Abdomen: Bowel sounds are normal, nontender, nondistended, no hepatosplenomegaly or masses, no abdominal bruits or    hernia , no rebound or guarding.   Rectal: not performed Extremities: No lower extremity edema. No clubbing or deformities.  Neuro: Alert and oriented x 4 , grossly normal neurologically.  Skin: Warm and dry, no rash or jaundice.   Psych: Alert and cooperative, normal mood and affect.  Labs Lab Results  Component Value Date   CREATININE 0.7 02/18/2017   BUN 7 02/18/2017   NA 136 08/23/2016   K 3.9 08/23/2016   CL 101 08/23/2016   CO2 26 08/23/2016    Lab Results  Component Value Date   HGBA1C 9.4 05/23/2017   Lab Results  Component Value Date   WBC 15.1 (H) 08/23/2016   HGB 17.2 (H) 08/23/2016   HCT 47.8 08/23/2016   MCV 95.4 08/23/2016   PLT 283 08/23/2016     Imaging Studies: No results found.

## 2017-08-23 NOTE — Telephone Encounter (Signed)
Tried to call pt to inform of pre-op appt 09/20/17 at 10:00am, [facility]. Letter mailed.

## 2017-08-26 DIAGNOSIS — I200 Unstable angina: Secondary | ICD-10-CM | POA: Diagnosis not present

## 2017-08-26 DIAGNOSIS — I2510 Atherosclerotic heart disease of native coronary artery without angina pectoris: Secondary | ICD-10-CM | POA: Diagnosis not present

## 2017-08-26 DIAGNOSIS — J449 Chronic obstructive pulmonary disease, unspecified: Secondary | ICD-10-CM | POA: Diagnosis not present

## 2017-08-26 DIAGNOSIS — I209 Angina pectoris, unspecified: Secondary | ICD-10-CM | POA: Diagnosis not present

## 2017-08-26 DIAGNOSIS — Z6841 Body Mass Index (BMI) 40.0 and over, adult: Secondary | ICD-10-CM | POA: Diagnosis not present

## 2017-08-26 DIAGNOSIS — Z1389 Encounter for screening for other disorder: Secondary | ICD-10-CM | POA: Diagnosis not present

## 2017-08-26 DIAGNOSIS — E1151 Type 2 diabetes mellitus with diabetic peripheral angiopathy without gangrene: Secondary | ICD-10-CM | POA: Diagnosis not present

## 2017-08-26 DIAGNOSIS — E1165 Type 2 diabetes mellitus with hyperglycemia: Secondary | ICD-10-CM | POA: Diagnosis not present

## 2017-08-28 ENCOUNTER — Encounter: Payer: Self-pay | Admitting: Gastroenterology

## 2017-08-28 NOTE — Assessment & Plan Note (Signed)
56 y/o male with multiple BMs daily, rectal bleeding for several years. Last TCS 2009 as outlined. He is on Eliquis. DDx: IBD, diabetic enteropathy or IBS with benign anorectal bleeding, less likely malignancy. Plan for colonoscopy in the near future. Deep sedation due to polypharmacy.  I have discussed the risks, alternatives, benefits with regards to but not limited to the risk of reaction to medication, bleeding, infection, perforation and the patient is agreeable to proceed. Written consent to be obtained.

## 2017-08-29 NOTE — Progress Notes (Signed)
CC'ED TO PCP 

## 2017-08-31 DIAGNOSIS — G4733 Obstructive sleep apnea (adult) (pediatric): Secondary | ICD-10-CM | POA: Diagnosis not present

## 2017-09-06 ENCOUNTER — Encounter: Payer: Medicare HMO | Attending: "Endocrinology | Admitting: Nutrition

## 2017-09-06 ENCOUNTER — Encounter: Payer: Self-pay | Admitting: Nutrition

## 2017-09-06 VITALS — Ht 70.0 in | Wt 288.0 lb

## 2017-09-06 DIAGNOSIS — Z713 Dietary counseling and surveillance: Secondary | ICD-10-CM | POA: Insufficient documentation

## 2017-09-06 DIAGNOSIS — E7800 Pure hypercholesterolemia, unspecified: Secondary | ICD-10-CM

## 2017-09-06 DIAGNOSIS — E781 Pure hyperglyceridemia: Secondary | ICD-10-CM

## 2017-09-06 DIAGNOSIS — IMO0002 Reserved for concepts with insufficient information to code with codable children: Secondary | ICD-10-CM

## 2017-09-06 DIAGNOSIS — E118 Type 2 diabetes mellitus with unspecified complications: Secondary | ICD-10-CM

## 2017-09-06 DIAGNOSIS — E1165 Type 2 diabetes mellitus with hyperglycemia: Secondary | ICD-10-CM

## 2017-09-06 DIAGNOSIS — E1159 Type 2 diabetes mellitus with other circulatory complications: Secondary | ICD-10-CM | POA: Insufficient documentation

## 2017-09-06 NOTE — Patient Instructions (Addendum)
Goals  Follow MY Plate  Eat three balanced meals per day  Do not skip meals Drink only water  Increase fresh fruits and vegetables  No sodas, sweets or junk food Get A1C down to 7% Lose 1-2 lbs per week Take 60 units of Tresiba daily.

## 2017-09-06 NOTE — Progress Notes (Signed)
  Medical Nutrition Therapy:  Appt start time: 1430 end time:  6546.   Assessment:  Primary concerns today: Diabetes Type 2, Obesity. His mother lives with him. He does the cooking an shopping. PMH Multiple heart attacks, COPD, HtN and Hyperlipidemia, Hypertriglyceridemia. . He sees Dr. Dorris Fetch, Endocrinology. He notes he had to change appt due to cost of copay. He is just about out of [place]. Sample pens given til his appt in January 2019.  Tresiba 60 units a day, Januvia daily. Limited financially. Unable to walk or exercise much due to poor physical condition and breathing issues. Lab Results  Component Value Date   HGBA1C 9.4 05/23/2017   CMP Latest Ref Rng & Units 02/18/2017 08/23/2016 02/13/2016  Glucose 65 - 99 mg/dL - 164(H) 169(H)  BUN 4 - 21 7 10 12   Creatinine 0.6 - 1.3 0.7 0.97 1.13  Sodium 135 - 145 mmol/L - 136 134(L)  Potassium 3.5 - 5.1 mmol/L - 3.9 3.1(L)  Chloride 101 - 111 mmol/L - 101 98(L)  CO2 22 - 32 mmol/L - 26 22  Calcium 8.9 - 10.3 mg/dL - 9.6 8.7(L)  Total Protein 6.5 - 8.1 g/dL - - -  Total Bilirubin 0.3 - 1.2 mg/dL - - -  Alkaline Phos 38 - 126 U/L - - -  AST 15 - 41 U/L - - -  ALT 17 - 63 U/L - - -   Lipid Panel     Component Value Date/Time   CHOL 136 02/18/2017   TRIG 559 (A) 02/18/2017   HDL 28 (A) 02/18/2017   CHOLHDL 6.1 04/28/2014 0121   VLDL 61 (H) 04/28/2014 0121   LDLCALC 47 04/28/2014 0121      Preferred Learning Style:  Auditory  Visual  Hands on  Learning Readiness:  Ready  Change in progress   MEDICATIONS: See list   DIETARY INTAKE:   24-hr recall:  B ( AM): Egg and 2 slice toast and water Snk ( AM):  L ( PM): 6 in sub club,Coke Snk ( PM): BBQ Chips, D ( PM): skpped Snk ( PM):  Beverages: water, soda,   Usual physical activity: ADL  Estimated energy needs: 1800  calories 200  g carbohydrates 135 g protein 50 g fat  Progress Towards Goal(s):  In progress.   Nutritional Diagnosis:  NB-1.1 Food and  nutrition-related knowledge deficit As related to Diabetes.  As evidenced by A1C 9.4%   Intervention:  Nutrition and Diabetes education provided on My Plate, CHO counting, meal planning, portion sizes, timing of meals, avoiding snacks between meals unless having a low blood sugar, target ranges for A1C and blood sugars, signs/symptoms and treatment of hyper/hypoglycemia, monitoring blood sugars, taking medications as prescribed, benefits of exercising 30 minutes per day and prevention of complications of DM.  Goals  Follow MY Plate  Eat three balanced meals per day  Do not skip meals Drink only water  Increase fresh fruits and vegetables  No sodas, sweets or junk food Get A1C down to 7% Lose 1-2 lbs per week   Teaching Method Utilized: Visual Auditory Hands on  Handouts given during visit include:  The Plate Method   Meal Plan Card  Diabetes Instructions   Barriers to learning/adherence to lifestyle change: none  Demonstrated degree of understanding via:  Teach Back   Monitoring/Evaluation:  Dietary intake, exercise, meal planning , and body weight in 1 month(s).'

## 2017-09-09 DIAGNOSIS — Z951 Presence of aortocoronary bypass graft: Secondary | ICD-10-CM | POA: Diagnosis not present

## 2017-09-09 DIAGNOSIS — R0602 Shortness of breath: Secondary | ICD-10-CM | POA: Diagnosis not present

## 2017-09-09 DIAGNOSIS — I259 Chronic ischemic heart disease, unspecified: Secondary | ICD-10-CM | POA: Diagnosis not present

## 2017-09-15 NOTE — Patient Instructions (Signed)
[name]  09/15/2017     @PREFPERIOPPHARMACY @   Your procedure is scheduled on  09/27/2017   Report to [facility] at  1000   A.M.  Call this number if you have problems the morning of surgery:  [phone]   Remember:  Do not eat food or drink liquids after midnight.  Take these medicines the morning of surgery with A SIP OF WATER  Norvasc, valium, lexapro, isosorbide, lisinopril, metoprolol, provigil, zantac. Use your inhaler before you come. Take 1/2 of your usual tresbia dose the night before.   Do not wear jewelry, make-up or nail polish.  Do not wear lotions, powders, or perfumes, or deoderant.  Do not shave 48 hours prior to surgery.  Men may shave face and neck.  Do not bring valuables to the hospital.  [facility] is not responsible for any belongings or valuables.  Contacts, dentures or bridgework may not be worn into surgery.  Leave your suitcase in the car.  After surgery it may be brought to your room.  For patients admitted to the hospital, discharge time will be determined by your treatment team.  Patients discharged the day of surgery will not be allowed to drive home.   Name and phone number of your driver:   family Special instructions:  Follow the diet and prep instructions given to you by Dr Nona Dell office.  Please read over the following fact sheets that you were given. Anesthesia Post-op Instructions and Care and Recovery After Surgery       Colonoscopy, Adult A colonoscopy is an exam to look at the entire large intestine. During the exam, a lubricated, bendable tube is inserted into the anus and then passed into the rectum, colon, and other parts of the large intestine. A colonoscopy is often done as a part of normal colorectal screening or in response to certain symptoms, such as anemia, persistent diarrhea, abdominal pain, and blood in the stool. The exam can help screen for and diagnose medical problems,  including:  Tumors.  Polyps.  Inflammation.  Areas of bleeding.  Tell a health care provider about:  Any allergies you have.  All medicines you are taking, including vitamins, herbs, eye drops, creams, and over-the-counter medicines.  Any problems you or family members have had with anesthetic medicines.  Any blood disorders you have.  Any surgeries you have had.  Any medical conditions you have.  Any problems you have had passing stool. What are the risks? Generally, this is a safe procedure. However, problems may occur, including:  Bleeding.  A tear in the intestine.  A reaction to medicines given during the exam.  Infection (rare).  What happens before the procedure? Eating and drinking restrictions Follow instructions from your health care provider about eating and drinking, which may include:  A few days before the procedure - follow a low-fiber diet. Avoid nuts, seeds, dried fruit, raw fruits, and vegetables.  1-3 days before the procedure - follow a clear liquid diet. Drink only clear liquids, such as clear broth or bouillon, black coffee or tea, clear juice, clear soft drinks or sports drinks, gelatin dessert, and popsicles. Avoid any liquids that contain red or purple dye.  On the day of the procedure - do not eat or drink anything during the 2 hours before the procedure, or within the time period that your health care provider recommends.  Bowel prep If you were prescribed an oral bowel prep to  clean out your colon:  Take it as told by your health care provider. Starting the day before your procedure, you will need to drink a large amount of medicated liquid. The liquid will cause you to have multiple loose stools until your stool is almost clear or light green.  If your skin or anus gets irritated from diarrhea, you may use these to relieve the irritation: ? Medicated wipes, such as adult wet wipes with aloe and vitamin E. ? A skin soothing-product like  petroleum jelly.  If you vomit while drinking the bowel prep, take a break for up to 60 minutes and then begin the bowel prep again. If vomiting continues and you cannot take the bowel prep without vomiting, call your health care provider.  General instructions  Ask your health care provider about changing or stopping your regular medicines. This is especially important if you are taking diabetes medicines or blood thinners.  Plan to have someone take you home from the hospital or clinic. What happens during the procedure?  An IV tube may be inserted into one of your veins.  You will be given medicine to help you relax (sedative).  To reduce your risk of infection: ? Your health care team will wash or sanitize their hands. ? Your anal area will be washed with soap.  You will be asked to lie on your side with your knees bent.  Your health care provider will lubricate a long, thin, flexible tube. The tube will have a camera and a light on the end.  The tube will be inserted into your anus.  The tube will be gently eased through your rectum and colon.  Air will be delivered into your colon to keep it open. You may feel some pressure or cramping.  The camera will be used to take images during the procedure.  A small tissue sample may be removed from your body to be examined under a microscope (biopsy). If any potential problems are found, the tissue will be sent to a lab for testing.  If small polyps are found, your health care provider may remove them and have them checked for cancer cells.  The tube that was inserted into your anus will be slowly removed. The procedure may vary among health care providers and hospitals. What happens after the procedure?  Your blood pressure, heart rate, breathing rate, and blood oxygen level will be monitored until the medicines you were given have worn off.  Do not drive for 24 hours after the exam.  You may have a small amount of blood in  your stool.  You may pass gas and have mild abdominal cramping or bloating due to the air that was used to inflate your colon during the exam.  It is up to you to get the results of your procedure. Ask your health care provider, or the department performing the procedure, when your results will be ready. This information is not intended to replace advice given to you by your health care provider. Make sure you discuss any questions you have with your health care provider. Document Released: 10/15/2000 Document Revised: 08/18/2016 Document Reviewed: 12/30/2015 Elsevier Interactive Patient Education  2018 [employer].  Colonoscopy, Adult, Care After This sheet gives you information about how to care for yourself after your procedure. Your health care provider may also give you more specific instructions. If you have problems or questions, contact your health care provider. What can I expect after the procedure? After the procedure, it  is common to have:  A small amount of blood in your stool for 24 hours after the procedure.  Some gas.  Mild abdominal cramping or bloating.  Follow these instructions at home: General instructions   For the first 24 hours after the procedure: ? Do not drive or use machinery. ? Do not sign important documents. ? Do not drink alcohol. ? Do your regular daily activities at a slower pace than normal. ? Eat soft, easy-to-digest foods. ? Rest often.  Take over-the-counter or prescription medicines only as told by your health care provider.  It is up to you to get the results of your procedure. Ask your health care provider, or the department performing the procedure, when your results will be ready. Relieving cramping and bloating  Try walking around when you have cramps or feel bloated.  Apply heat to your abdomen as told by your health care provider. Use a heat source that your health care provider recommends, such as a moist heat pack or a heating  pad. ? Place a towel between your skin and the heat source. ? Leave the heat on for 20-30 minutes. ? Remove the heat if your skin turns bright red. This is especially important if you are unable to feel pain, heat, or cold. You may have a greater risk of getting burned. Eating and drinking  Drink enough fluid to keep your urine clear or pale yellow.  Resume your normal diet as instructed by your health care provider. Avoid heavy or fried foods that are hard to digest.  Avoid drinking alcohol for as long as instructed by your health care provider. Contact a health care provider if:  You have blood in your stool 2-3 days after the procedure. Get help right away if:  You have more than a small spotting of blood in your stool.  You pass large blood clots in your stool.  Your abdomen is swollen.  You have nausea or vomiting.  You have a fever.  You have increasing abdominal pain that is not relieved with medicine. This information is not intended to replace advice given to you by your health care provider. Make sure you discuss any questions you have with your health care provider. Document Released: 06/01/2004 Document Revised: 07/12/2016 Document Reviewed: 12/30/2015 Elsevier Interactive Patient Education  2018 [place] Anesthesia is a term that refers to techniques, procedures, and medicines that help a person stay safe and comfortable during a medical procedure. Monitored anesthesia care, or sedation, is one type of anesthesia. Your anesthesia specialist may recommend sedation if you will be having a procedure that does not require you to be unconscious, such as:  Cataract surgery.  A dental procedure.  A biopsy.  A colonoscopy.  During the procedure, you may receive a medicine to help you relax (sedative). There are three levels of sedation:  Mild sedation. At this level, you may feel awake and relaxed. You will be able to follow  directions.  Moderate sedation. At this level, you will be sleepy. You may not remember the procedure.  Deep sedation. At this level, you will be asleep. You will not remember the procedure.  The more medicine you are given, the deeper your level of sedation will be. Depending on how you respond to the procedure, the anesthesia specialist may change your level of sedation or the type of anesthesia to fit your needs. An anesthesia specialist will monitor you closely during the procedure. Let your health care provider know  about:  Any allergies you have.  All medicines you are taking, including vitamins, herbs, eye drops, creams, and over-the-counter medicines.  Any use of steroids (by mouth or as a cream).  Any problems you or family members have had with sedatives and anesthetic medicines.  Any blood disorders you have.  Any surgeries you have had.  Any medical conditions you have, such as sleep apnea.  Whether you are pregnant or may be pregnant.  Any use of cigarettes, alcohol, or street drugs. What are the risks? Generally, this is a safe procedure. However, problems may occur, including:  Getting too much medicine (oversedation).  Nausea.  Allergic reaction to medicines.  Trouble breathing. If this happens, a breathing tube may be used to help with breathing. It will be removed when you are awake and breathing on your own.  Heart trouble.  Lung trouble.  Before the procedure Staying hydrated Follow instructions from your health care provider about hydration, which may include:  Up to 2 hours before the procedure - you may continue to drink clear liquids, such as water, clear fruit juice, black coffee, and plain tea.  Eating and drinking restrictions Follow instructions from your health care provider about eating and drinking, which may include:  8 hours before the procedure - stop eating heavy meals or foods such as meat, fried foods, or fatty foods.  6 hours  before the procedure - stop eating light meals or foods, such as toast or cereal.  6 hours before the procedure - stop drinking milk or drinks that contain milk.  2 hours before the procedure - stop drinking clear liquids.  Medicines Ask your health care provider about:  Changing or stopping your regular medicines. This is especially important if you are taking diabetes medicines or blood thinners.  Taking medicines such as aspirin and ibuprofen. These medicines can thin your blood. Do not take these medicines before your procedure if your health care provider instructs you not to.  Tests and exams  You will have a physical exam.  You may have blood tests done to show: ? How well your kidneys and liver are working. ? How well your blood can clot.  General instructions  Plan to have someone take you home from the hospital or clinic.  If you will be going home right after the procedure, plan to have someone with you for 24 hours.  What happens during the procedure?  Your blood pressure, heart rate, breathing, level of pain and overall condition will be monitored.  An IV tube will be inserted into one of your veins.  Your anesthesia specialist will give you medicines as needed to keep you comfortable during the procedure. This may mean changing the level of sedation.  The procedure will be performed. After the procedure  Your blood pressure, heart rate, breathing rate, and blood oxygen level will be monitored until the medicines you were given have worn off.  Do not drive for 24 hours if you received a sedative.  You may: ? Feel sleepy, clumsy, or nauseous. ? Feel forgetful about what happened after the procedure. ? Have a sore throat if you had a breathing tube during the procedure. ? Vomit. This information is not intended to replace advice given to you by your health care provider. Make sure you discuss any questions you have with your health care provider. Document  Released: 07/14/2005 Document Revised: 03/26/2016 Document Reviewed: 02/08/2016 Elsevier Interactive Patient Education  [employer].

## 2017-09-20 ENCOUNTER — Other Ambulatory Visit: Payer: Self-pay

## 2017-09-20 ENCOUNTER — Encounter (HOSPITAL_COMMUNITY)
Admission: RE | Admit: 2017-09-20 | Discharge: 2017-09-20 | Disposition: A | Discharge status: Home / Self Care | Payer: Medicare HMO | Source: Ambulatory Visit | Attending: Gastroenterology | Admitting: Gastroenterology

## 2017-09-20 ENCOUNTER — Encounter (HOSPITAL_COMMUNITY): Payer: Self-pay

## 2017-09-20 DIAGNOSIS — R9431 Abnormal electrocardiogram [ECG] [EKG]: Secondary | ICD-10-CM | POA: Diagnosis not present

## 2017-09-20 DIAGNOSIS — Z01810 Encounter for preprocedural cardiovascular examination: Secondary | ICD-10-CM | POA: Diagnosis not present

## 2017-09-20 DIAGNOSIS — K5790 Diverticulosis of intestine, part unspecified, without perforation or abscess without bleeding: Secondary | ICD-10-CM | POA: Diagnosis not present

## 2017-09-20 DIAGNOSIS — K621 Rectal polyp: Secondary | ICD-10-CM | POA: Diagnosis not present

## 2017-09-20 DIAGNOSIS — K635 Polyp of colon: Secondary | ICD-10-CM | POA: Diagnosis not present

## 2017-09-20 LAB — BASIC METABOLIC PANEL
Anion gap: 10 (ref 5–15)
BUN: 13 mg/dL (ref 6–20)
CHLORIDE: 102 mmol/L (ref 101–111)
CO2: 26 mmol/L (ref 22–32)
CREATININE: 0.96 mg/dL (ref 0.61–1.24)
Calcium: 9.1 mg/dL (ref 8.9–10.3)
GFR calc Af Amer: >60 mL/min (ref >60)
GFR calc non Af Amer: >60 mL/min (ref >60)
Glucose, Bld: 240 mg/dL — ABNORMAL HIGH (ref 65–99)
Potassium: 3.4 mmol/L — ABNORMAL LOW (ref 3.5–5.1)
SODIUM: 138 mmol/L (ref 135–145)

## 2017-09-20 LAB — CBC
HCT: 49.9 % (ref 39.0–52.0)
HEMOGLOBIN: 16.5 g/dL (ref 13.0–17.0)
MCH: 31.8 pg (ref 26.0–34.0)
MCHC: 33.1 g/dL (ref 30.0–36.0)
MCV: 96.1 fL (ref 78.0–100.0)
Platelets: 265 10*3/uL (ref 150–400)
RBC: 5.19 MIL/uL (ref 4.22–5.81)
RDW: 12.6 % (ref 11.5–15.5)
WBC: 9.9 10*3/uL (ref 4.0–10.5)

## 2017-09-20 NOTE — Progress Notes (Signed)
LMOM to call.

## 2017-09-21 NOTE — Progress Notes (Signed)
PT is aware.

## 2017-09-26 ENCOUNTER — Telehealth: Payer: Self-pay

## 2017-09-26 NOTE — Telephone Encounter (Signed)
Pt is scheduled for his colonoscopy tomorrow and needs advice on diabetic meds. Last night his BS was 339   He took 60 units of insulin. This morning his BS was 244. He took Januvia 100 mg. Please make recommendations for his diabetic meds.

## 2017-09-26 NOTE — Telephone Encounter (Signed)
PT is aware.

## 2017-09-26 NOTE — Telephone Encounter (Signed)
Looks like he already took his "standard" dosage of insulin (last night) and Januvia (this morning).   If his sugar is over 200 at bedtime he can take full dose insulin [name])  instead of "half-dose" that are on his instructions. He can take his Januvia in the morning with sip of water before his procedure if his sugars remain high in the morning.   I cannot provide advise regarding increasing any dosages of his insulin or januvia. If he feels his sugars are too high (>300) during the day today he should contact prescribing provider.

## 2017-09-27 ENCOUNTER — Ambulatory Visit (HOSPITAL_COMMUNITY): Payer: Medicare HMO | Admitting: Anesthesiology

## 2017-09-27 ENCOUNTER — Ambulatory Visit (HOSPITAL_COMMUNITY)
Admission: RE | Admit: 2017-09-27 | Discharge: 2017-09-27 | Disposition: A | Discharge status: Home / Self Care | Payer: Medicare HMO | Source: Ambulatory Visit | Attending: Gastroenterology | Admitting: Gastroenterology

## 2017-09-27 ENCOUNTER — Encounter (HOSPITAL_COMMUNITY): Payer: Self-pay | Admitting: *Deleted

## 2017-09-27 ENCOUNTER — Encounter (HOSPITAL_COMMUNITY)
Admission: RE | Disposition: A | Discharge status: Home / Self Care | Payer: Self-pay | Source: Ambulatory Visit | Attending: Gastroenterology

## 2017-09-27 DIAGNOSIS — Z955 Presence of coronary angioplasty implant and graft: Secondary | ICD-10-CM | POA: Insufficient documentation

## 2017-09-27 DIAGNOSIS — Z951 Presence of aortocoronary bypass graft: Secondary | ICD-10-CM | POA: Diagnosis not present

## 2017-09-27 DIAGNOSIS — D123 Benign neoplasm of transverse colon: Secondary | ICD-10-CM | POA: Diagnosis not present

## 2017-09-27 DIAGNOSIS — I2510 Atherosclerotic heart disease of native coronary artery without angina pectoris: Secondary | ICD-10-CM | POA: Insufficient documentation

## 2017-09-27 DIAGNOSIS — Z6841 Body Mass Index (BMI) 40.0 and over, adult: Secondary | ICD-10-CM | POA: Insufficient documentation

## 2017-09-27 DIAGNOSIS — G4733 Obstructive sleep apnea (adult) (pediatric): Secondary | ICD-10-CM | POA: Diagnosis not present

## 2017-09-27 DIAGNOSIS — Z86010 Personal history of colonic polyps: Secondary | ICD-10-CM | POA: Diagnosis not present

## 2017-09-27 DIAGNOSIS — K648 Other hemorrhoids: Secondary | ICD-10-CM | POA: Diagnosis not present

## 2017-09-27 DIAGNOSIS — Z794 Long term (current) use of insulin: Secondary | ICD-10-CM | POA: Insufficient documentation

## 2017-09-27 DIAGNOSIS — Z8673 Personal history of transient ischemic attack (TIA), and cerebral infarction without residual deficits: Secondary | ICD-10-CM | POA: Diagnosis not present

## 2017-09-27 DIAGNOSIS — D120 Benign neoplasm of cecum: Secondary | ICD-10-CM | POA: Diagnosis not present

## 2017-09-27 DIAGNOSIS — I480 Paroxysmal atrial fibrillation: Secondary | ICD-10-CM | POA: Insufficient documentation

## 2017-09-27 DIAGNOSIS — F329 Major depressive disorder, single episode, unspecified: Secondary | ICD-10-CM | POA: Insufficient documentation

## 2017-09-27 DIAGNOSIS — K219 Gastro-esophageal reflux disease without esophagitis: Secondary | ICD-10-CM | POA: Diagnosis not present

## 2017-09-27 DIAGNOSIS — Z79899 Other long term (current) drug therapy: Secondary | ICD-10-CM | POA: Insufficient documentation

## 2017-09-27 DIAGNOSIS — F17210 Nicotine dependence, cigarettes, uncomplicated: Secondary | ICD-10-CM | POA: Insufficient documentation

## 2017-09-27 DIAGNOSIS — K621 Rectal polyp: Secondary | ICD-10-CM | POA: Diagnosis not present

## 2017-09-27 DIAGNOSIS — Z8249 Family history of ischemic heart disease and other diseases of the circulatory system: Secondary | ICD-10-CM | POA: Insufficient documentation

## 2017-09-27 DIAGNOSIS — K5730 Diverticulosis of large intestine without perforation or abscess without bleeding: Secondary | ICD-10-CM | POA: Diagnosis not present

## 2017-09-27 DIAGNOSIS — D122 Benign neoplasm of ascending colon: Secondary | ICD-10-CM | POA: Diagnosis not present

## 2017-09-27 DIAGNOSIS — D124 Benign neoplasm of descending colon: Secondary | ICD-10-CM | POA: Insufficient documentation

## 2017-09-27 DIAGNOSIS — D125 Benign neoplasm of sigmoid colon: Secondary | ICD-10-CM | POA: Diagnosis not present

## 2017-09-27 DIAGNOSIS — N189 Chronic kidney disease, unspecified: Secondary | ICD-10-CM | POA: Diagnosis not present

## 2017-09-27 DIAGNOSIS — H409 Unspecified glaucoma: Secondary | ICD-10-CM | POA: Diagnosis not present

## 2017-09-27 DIAGNOSIS — K625 Hemorrhage of anus and rectum: Secondary | ICD-10-CM | POA: Diagnosis not present

## 2017-09-27 DIAGNOSIS — Z87442 Personal history of urinary calculi: Secondary | ICD-10-CM | POA: Diagnosis not present

## 2017-09-27 DIAGNOSIS — I252 Old myocardial infarction: Secondary | ICD-10-CM | POA: Diagnosis not present

## 2017-09-27 DIAGNOSIS — Z800 Family history of malignant neoplasm of digestive organs: Secondary | ICD-10-CM | POA: Insufficient documentation

## 2017-09-27 DIAGNOSIS — Z9889 Other specified postprocedural states: Secondary | ICD-10-CM | POA: Insufficient documentation

## 2017-09-27 DIAGNOSIS — I129 Hypertensive chronic kidney disease with stage 1 through stage 4 chronic kidney disease, or unspecified chronic kidney disease: Secondary | ICD-10-CM | POA: Insufficient documentation

## 2017-09-27 DIAGNOSIS — Z91041 Radiographic dye allergy status: Secondary | ICD-10-CM | POA: Insufficient documentation

## 2017-09-27 DIAGNOSIS — E785 Hyperlipidemia, unspecified: Secondary | ICD-10-CM | POA: Insufficient documentation

## 2017-09-27 DIAGNOSIS — J449 Chronic obstructive pulmonary disease, unspecified: Secondary | ICD-10-CM | POA: Insufficient documentation

## 2017-09-27 DIAGNOSIS — E1122 Type 2 diabetes mellitus with diabetic chronic kidney disease: Secondary | ICD-10-CM | POA: Insufficient documentation

## 2017-09-27 DIAGNOSIS — K635 Polyp of colon: Secondary | ICD-10-CM | POA: Diagnosis not present

## 2017-09-27 DIAGNOSIS — Z7951 Long term (current) use of inhaled steroids: Secondary | ICD-10-CM | POA: Insufficient documentation

## 2017-09-27 DIAGNOSIS — Z7901 Long term (current) use of anticoagulants: Secondary | ICD-10-CM | POA: Insufficient documentation

## 2017-09-27 DIAGNOSIS — Z833 Family history of diabetes mellitus: Secondary | ICD-10-CM | POA: Insufficient documentation

## 2017-09-27 DIAGNOSIS — K2270 Barrett's esophagus without dysplasia: Secondary | ICD-10-CM | POA: Insufficient documentation

## 2017-09-27 HISTORY — PX: COLONOSCOPY WITH PROPOFOL: SHX5780

## 2017-09-27 HISTORY — PX: POLYPECTOMY: SHX5525

## 2017-09-27 LAB — HM COLONOSCOPY

## 2017-09-27 LAB — GLUCOSE, CAPILLARY
GLUCOSE-CAPILLARY: 151 mg/dL — AB (ref 65–99)
GLUCOSE-CAPILLARY: 135 mg/dL — AB (ref 65–99)

## 2017-09-27 SURGERY — COLONOSCOPY WITH PROPOFOL
Anesthesia: Monitor Anesthesia Care

## 2017-09-27 MED ORDER — CHLORHEXIDINE GLUCONATE CLOTH 2 % EX PADS: 6.0000 | MEDICATED_PAD | Freq: Once | CUTANEOUS | Status: DC

## 2017-09-27 MED ORDER — MIDAZOLAM HCL 2 MG/2ML IJ SOLN
INTRAMUSCULAR | Status: AC
  Filled 2017-09-27: qty 2

## 2017-09-27 MED ORDER — STERILE WATER FOR IRRIGATION IR SOLN
Status: DC | PRN
  Administered 2017-09-27: 100 mL

## 2017-09-27 MED ORDER — PROPOFOL 500 MG/50ML IV EMUL
INTRAVENOUS | Status: DC | PRN
  Administered 2017-09-27 (×2): via INTRAVENOUS
  Administered 2017-09-27 (×2): 100 ug/kg/min via INTRAVENOUS
  Administered 2017-09-27: 70 ug/kg/min via INTRAVENOUS
  Administered 2017-09-27: 100 ug/kg/min via INTRAVENOUS

## 2017-09-27 MED ORDER — LIDOCAINE VISCOUS 2 % MT SOLN
OROMUCOSAL | Status: AC
  Filled 2017-09-27: qty 15

## 2017-09-27 MED ORDER — IPRATROPIUM-ALBUTEROL 0.5-2.5 (3) MG/3ML IN SOLN
3.0000 mL | Freq: Once | RESPIRATORY_TRACT | Status: AC
  Administered 2017-09-27: 3 mL via RESPIRATORY_TRACT

## 2017-09-27 MED ORDER — FENTANYL CITRATE (PF) 100 MCG/2ML IJ SOLN
INTRAMUSCULAR | Status: AC
  Filled 2017-09-27: qty 2

## 2017-09-27 MED ORDER — PROPOFOL 10 MG/ML IV BOLUS
INTRAVENOUS | Status: AC
  Filled 2017-09-27: qty 40

## 2017-09-27 MED ORDER — LACTATED RINGERS IV SOLN
INTRAVENOUS | Status: DC
  Administered 2017-09-27: 1000 mL via INTRAVENOUS

## 2017-09-27 MED ORDER — FENTANYL CITRATE (PF) 100 MCG/2ML IJ SOLN
25.0000 ug | Freq: Once | INTRAMUSCULAR | Status: AC
Start: 2017-09-27 — End: 2017-09-27
  Administered 2017-09-27: 25 ug via INTRAVENOUS

## 2017-09-27 MED ORDER — PROPOFOL 10 MG/ML IV BOLUS
INTRAVENOUS | Status: DC | PRN
  Administered 2017-09-27 (×3): 15 mg via INTRAVENOUS

## 2017-09-27 MED ORDER — IPRATROPIUM-ALBUTEROL 0.5-2.5 (3) MG/3ML IN SOLN
RESPIRATORY_TRACT | Status: AC
  Filled 2017-09-27: qty 3

## 2017-09-27 MED ORDER — PROPOFOL 10 MG/ML IV BOLUS
INTRAVENOUS | Status: AC
  Filled 2017-09-27: qty 20

## 2017-09-27 MED ORDER — MIDAZOLAM HCL 2 MG/2ML IJ SOLN
1.0000 mg | INTRAMUSCULAR | Status: AC
  Administered 2017-09-27: 2 mg via INTRAVENOUS

## 2017-09-27 NOTE — H&P (Signed)
Primary Care Physician:  Sharilyn Sites, MD Primary Gastroenterologist:  Dr. Oneida Alar  Pre-Procedure History & Physical: HPI:  [name] is a 56 y.o. male here for  BRBPR.  Past Medical History:  Diagnosis Date  . Abnormal myocardial perfusion study 01/01/2011   there a small to moderate sized inferobasal scar  . Barrett's esophagus   . CAD (coronary artery disease) 2008   bypass grafting   . Cataracts, bilateral   . Chronic kidney disease    hx of kidney stones  . Claudication ([place]) 11/16/2011   PV test perform shows normal  . COPD (chronic obstructive pulmonary disease) ([place])   . Depression   . Diabetes ([place])    type 2 diabetes mellitus  . Dysrhythmia   . GERD (gastroesophageal reflux disease)   . Glaucoma   . Hernia of abdominal wall   . HTN (hypertension)   . Hyperlipidemia   . Morbid obesity ([place])   . Myocardial infarction ([place]) 2008,2009,2009  . OSA (obstructive sleep apnea)    on cpap  . PAF (paroxysmal atrial fibrillation) ([place])   . S/P colonoscopy 2009   3-4 mm transverse colon erosions likely secondary to  ASA  . S/P endoscopy Dec 2011   moderate erosive gastritis, Barrett's esophagus 1-2cm  . Sleep apnea   . SOB (shortness of breath) 11/03/2007   2D Echo EF 50%-55%  . Stroke [facility])     Past Surgical History:  Procedure Laterality Date  . BIOPSY N/A 04/29/2015   Procedure: BIOPSY;  Surgeon: Danie Binder, MD;  Location: [facility];  Service: Endoscopy;  Laterality: N/A;  . CABG X 4  03/2008  . CARDIAC CATHETERIZATION  2009   stent placement to the left circumflex a 2.25   . CARDIAC CATHETERIZATION  07/08/2010  . CORONARY ARTERY BYPASS GRAFT  2008   4 vessels  . CORONARY STENT PLACEMENT  12/29/12  . CORONARY STENT PLACEMENT  12/2012  . ESOPHAGEAL DILATION N/A 04/29/2015   Procedure: ESOPHAGEAL DILATION 15 mm, 16 mm;  Surgeon: Danie Binder, MD;  Location: [facility];  Service: Endoscopy;  Laterality: N/A;  . ESOPHAGOGASTRODUODENOSCOPY  09/2011   Barrett's esophagus, no dysplasia on biopsies. Distal esophagitis. Status post dilation. Moderate gastritis and duodenitis, but biopsies benign. Next EGD in November 2015 for surveillance of Barrett's esophagus.  . ESOPHAGOGASTRODUODENOSCOPY (EGD) WITH PROPOFOL N/A 04/29/2015   SLF: 1. Barretts esophagus 2. Moderate non-erosive gastritis.   Marland Kitchen HERNIA REPAIR     ventral hernia repair  . LEFT HEART CATHETERIZATION WITH CORONARY/GRAFT ANGIOGRAM N/A 12/29/2012   Procedure: LEFT HEART CATHETERIZATION WITH Beatrix Fetters;  Surgeon: Troy Sine, MD;  Location: [facility] C[facility] LAB;  Service: Cardiovascular;  Laterality: N/A;  . ORIF FIBULA FRACTURE Right 06/03/2015   Procedure: OPEN REDUCTION INTERNAL FIXATION (ORIF) DISTAL FIBULA  FRACTURE;  Surgeon: Garald Balding, MD;  Location: [facility];  Service: Orthopedics;  Laterality: Right;  . PERCUTANEOUS CORONARY STENT INTERVENTION (PCI-S)  12/29/2012   Procedure: PERCUTANEOUS CORONARY STENT INTERVENTION (PCI-S);  Surgeon: Troy Sine, MD;  Location: [facility] CATH LAB;  Service: Cardiovascular;;  . SAVORY DILATION  09/06/2011   Procedure: SAVORY DILATION;  Surgeon: Dorothyann Peng, MD;  Location: [facility];  Service: Endoscopy;  Laterality: N/A;  Dilated with 74mm    Prior to Admission medications   Medication Sig Start Date End Date Taking? Authorizing Provider  amLODipine (NORVASC) 10 MG tablet Take 1 tablet (10 mg total) by mouth daily. 01/11/17 09/12/17 Yes Arnoldo Lenis,  MD  apixaban (ELIQUIS) 5 MG TABS tablet Take 1 tablet (5 mg total) by mouth 2 (two) times daily. 06/04/15  Yes Petrarca, Mike Craze, PA-C  budesonide-formoterol (SYMBICORT) 160-4.5 MCG/ACT inhaler Inhale 1 puff daily into the lungs.    Yes [provider]  Choline Fenofibrate (TRILIPIX) 135 MG capsule Take 135 mg by mouth at bedtime.    Yes [provider]  diazepam (VALIUM) 5 MG tablet Take 5 mg by mouth 2 (two) times daily.   Yes [provider]  escitalopram  (LEXAPRO) 20 MG tablet Take 20 mg daily by mouth.  04/27/14  Yes [provider]  Fluticasone-Umeclidin-Vilant (TRELEGY ELLIPTA) 100-62.5-25 MCG/INH AEPB Inhale 1 puff daily into the lungs.    Yes [provider]  furosemide (LASIX) 80 MG tablet Take 80 mg by mouth daily. 04/22/15  Yes [provider]  Insulin Degludec (TRESIBA FLEXTOUCH) 200 UNIT/ML SOPN Inject 60 Units into the skin at bedtime. 08/08/17  Yes Cassandria Anger, MD  isosorbide mononitrate (IMDUR) 60 MG 24 hr tablet Take 1 tablet (60 mg total) by mouth 3 (three) times daily. Please make an appointment for additional refills Patient taking differently: Take 60 mg by mouth 3 (three) times daily.  03/21/15  Yes Croitoru, Mihai, MD  lisinopril (PRINIVIL,ZESTRIL) 5 MG tablet Take 5 mg by mouth daily.   Yes [provider]  metoprolol tartrate (LOPRESSOR) 100 MG tablet Take 1 tablet (100 mg total) by mouth 2 (two) times daily. 03/17/17  Yes BranchAlphonse Guild, MD  modafinil (PROVIGIL) 100 MG tablet Take 100 mg daily by mouth.    Yes [provider]  nitroGLYCERIN (NITROLINGUAL) 0.4 MG/SPRAY spray Place 1 spray under the tongue every 5 (five) minutes x 3 doses as needed for chest pain.   Yes [provider]  pantoprazole (PROTONIX) 40 MG tablet TAKE 1 TABLET BY MOUTH 30 MINUTES BEFORE BREAKFAST Patient taking differently: TAKE 40 MG BY MOUTH 30 MINUTES BEFORE BREAKFAST 08/19/16  Yes Annitta Needs, NP  polyethylene glycol-electrolytes (TRILYTE) 420 g solution Take 4,000 mLs by mouth as directed. 08/23/17  Yes Fields, Sandi L, MD  potassium chloride SA (K-DUR,KLOR-CON) 20 MEQ tablet Take 1 tablet (20 mEq total) by mouth 2 (two) times daily. 07/22/14  Yes Croitoru, Mihai, MD  ranitidine (ZANTAC) 150 MG capsule Take 150 mg at bedtime by mouth.    Yes [provider]  rosuvastatin (CRESTOR) 20 MG tablet Take 20 mg by mouth daily. 07/08/16  Yes [provider]  sitaGLIPtin  (JANUVIA) 100 MG tablet Take 100 mg by mouth daily.    Yes [provider]  glucose blood (ONETOUCH VERIO) test strip Use as instructed qid. e11.65 Patient not taking: Reported on 09/12/2017 08/08/17   Cassandria Anger, MD  Insulin Pen Needle (B-D ULTRAFINE III SHORT PEN) 31G X 8 MM MISC 1 each by Does not apply route as directed. Patient not taking: Reported on 09/12/2017 07/18/17   Cassandria Anger, MD    Allergies as of 08/23/2017 - Review Complete 08/23/2017  Allergen Reaction Noted  . Contrast media [iodinated diagnostic agents] Other (See Comments) 03/14/2013  . Iohexol Other (See Comments) 01/02/2008    Family History  Problem Relation Age of Onset  . Diabetes Mother   . Heart disease Mother   . Diabetes Father   . Heart disease Father   . Pancreatic cancer Brother        age 28, doing well  . Colon cancer Neg Hx   .  Anesthesia problems Neg Hx   . Hypotension Neg Hx   . Malignant hyperthermia Neg Hx   . Pseudochol deficiency Neg Hx     Social History   Socioeconomic History  . Marital status: Divorced    Spouse name: Not on file  . Number of children: Not on file  . Years of education: Not on file  . Highest education level: Not on file  Social Needs  . Financial resource strain: Not on file  . Food insecurity - worry: Not on file  . Food insecurity - inability: Not on file  . Transportation needs - medical: Not on file  . Transportation needs - non-medical: Not on file  Occupational History  . Not on file  Tobacco Use  . Smoking status: Current Every Day Smoker    Packs/day: 0.50    Years: 30.00    Pack years: 15.00    Types: Cigarettes  . Smokeless tobacco: Never Used  Substance and Sexual Activity  . Alcohol use: Yes    Comment: occ  . Drug use: No  . Sexual activity: No  Other Topics Concern  . Not on file  Social History Narrative  . Not on file    Review of Systems: See HPI, otherwise negative ROS   Physical Exam: BP  121/83   Pulse 66   Temp 98.4 F (36.9 C) (Oral)   Resp (!) 21   Ht 5\' 10"  (1.778 m)   Wt 292 lb (132.5 kg)   SpO2 98%   BMI 41.90 kg/m  General:   Alert,  pleasant and cooperative in NAD Head:  Normocephalic and atraumatic. Neck:  Supple; Lungs:  Clear throughout to auscultation.    Heart:  Regular rate and rhythm. Abdomen:  Soft, nontender and nondistended. Normal bowel sounds, without guarding, and without rebound.   Neurologic:  Alert and  oriented x4;  grossly normal neurologically.  Impression/Plan:    BRBPR  PLAN: TCS TODAY DISCUSSED PROCEDURE, BENEFITS, & RISKS: < 1% chance of medication reaction, bleeding, perforation, or rupture of spleen/liver.

## 2017-09-27 NOTE — Anesthesia Postprocedure Evaluation (Signed)
Anesthesia Post Note  Patient: [name]  Procedure(s) Performed: COLONOSCOPY WITH PROPOFOL (N/A ) POLYPECTOMY  Patient location during evaluation: PACU Anesthesia Type: MAC Level of consciousness: awake and alert, oriented and patient cooperative Pain management: pain level controlled Vital Signs Assessment: post-procedure vital signs reviewed and stable Respiratory status: spontaneous breathing and respiratory function stable Cardiovascular status: stable Postop Assessment: no apparent nausea or vomiting Anesthetic complications: no     Last Vitals:  Vitals:   09/27/17 1400 09/27/17 1415  BP: 100/68 106/69  Pulse: 70 71  Resp: (!) 24 (!) 26  Temp:    SpO2: 92% 93%    Last Pain:  Vitals:   09/27/17 1016  TempSrc: Oral                 [name] A

## 2017-09-27 NOTE — Op Note (Signed)
[facility] Patient Name: [name] Procedure Date: 09/27/2017 11:42 AM MRN: [id number] Date of Birth: 1961/08/25 Attending MD: Barney Drain MD, MD CSN: [id number] Age: 56 Admit Type: Outpatient Procedure:                Colonoscopy WITH EMR/SNARE CAUTERY POLYPECTOMY Indications:              Hematochezia Providers:                Barney Drain MD, MD, Hinton Rao, RN, Randa Spike, Technician Referring MD:             Halford Chessman MD, MD Medicines:                Propofol per Anesthesia Complications:            No immediate complications. Estimated Blood Loss:     Estimated blood loss was minimal. Procedure:                Pre-Anesthesia Assessment:                           - Prior to the procedure, a History and Physical                            was performed, and patient medications and                            allergies were reviewed. The patient's tolerance of                            previous anesthesia was also reviewed. The risks                            and benefits of the procedure and the sedation                            options and risks were discussed with the patient.                            All questions were answered, and informed consent                            was obtained. Prior Anticoagulants: The patient has                            taken Eliquis (apixaban), last dose was 2 days                            prior to procedure. ASA Grade Assessment: II - A                            patient with mild systemic disease. After reviewing  the risks and benefits, the patient was deemed in                            satisfactory condition to undergo the procedure.                            After obtaining informed consent, the colonoscope                            was passed under direct vision. Throughout the                            procedure, the patient's blood pressure,  pulse, and                            oxygen saturations were monitored continuously. The                            EC-[id number] ([id number]) scope was introduced through                            the anus and advanced to the 5 cm into the ileum.                            The colonoscopy was technically difficult and                            complex due to a tortuous colon. Successful                            completion of the procedure was aided by COLOWRAP.                            The patient tolerated the procedure well. The                            quality of the bowel preparation was good. The                            ileocecal valve, appendiceal orifice, and rectum                            were photographed. Scope In: 12:10:27 PM Scope Out: 1:38:14 PM Scope Withdrawal Time: 1 hour 24 minutes 32 seconds  Total Procedure Duration: 1 hour 27 minutes 47 seconds  Findings:      The terminal ileum appeared normal.      Ten flat and sessile polyps were found in the sigmoid colon, descending       colon, splenic flexure, transverse colon, ascending colon and cecum. The       polyps were 4 to 8 mm in size. These polyps were removed with a hot       snare. Resection and retrieval were complete.      Three sessile polyps were found in the rectum and  descending colon. The       polyps were 2 to 4 mm in size. These polyps were removed with a cold       snare. Resection and retrieval were complete.      Multiple small-mouthed diverticula were found in the sigmoid colon and       descending colon.      Internal hemorrhoids were found during retroflexion. The hemorrhoids       were moderate.      The recto-sigmoid colon, sigmoid colon and descending colon revealed       significantly excessive looping.      Ten flat polyps were found in the transverse colon(1), hepatic       flexure(2), ascending colon(6) and cecum(1). The polyps were 6 to 8 mm       in size. ENDOSCOPIC MUCOSAL  RESECTION. Resection and retrieval were       complete. Area was successfully injected with 12 mL ELEVIEW for a lift       polypectomy. Impression:               - The examined portion of the ileum was normal.                           - TWENTY 4 to 8 mm polyps in the sigmoid colon, in                            the descending colon, at the splenic flexure, in                            the transverse colon, in the ascending colon and in                            the cecum, removed with a hot snare. Resected and                            retrieved. Injected.                           - Three 2 to 4 mm polyps in the rectum and in the                            descending colon, removed with a cold snare.                            Resected and retrieved.                           - Diverticulosis in the sigmoid colon and in the                            descending colon.                           - Internal hemorrhoids.                           - There was  significant looping of the colon. Moderate Sedation:      Per Anesthesia Care Recommendation:           - High fiber diet.                           - Await pathology results.                           - Repeat colonoscopy 1-3 YEARS for surveillance                            WITH MAC/COLOWRAP AND ENDOCUFF.                           - Continue present medications.                           - Patient has a contact number available for                            emergencies. The signs and symptoms of potential                            delayed complications were discussed with the                            patient. Return to normal activities tomorrow.                            Written discharge instructions were provided to the                            patient. Procedure Code(s):        --- Professional ---                           [phone], Colonoscopy, flexible; with removal of                            tumor(s), polyp(s), or  other lesion(s) by snare                            technique                           45381, Colonoscopy, flexible; with directed                            submucosal injection(s), any substance Diagnosis Code(s):        --- Professional ---                           K64.8, Other hemorrhoids                           D12.5, Benign neoplasm of sigmoid colon  D12.3, Benign neoplasm of transverse colon (hepatic                            flexure or splenic flexure)                           D12.2, Benign neoplasm of ascending colon                           D12.0, Benign neoplasm of cecum                           K62.1, Rectal polyp                           D12.4, Benign neoplasm of descending colon                           K92.1, Melena (includes Hematochezia)                           K57.30, Diverticulosis of large intestine without                            perforation or abscess without bleeding CPT copyright 2016 [employer]. All rights reserved. The codes documented in this report are preliminary and upon coder review may  be revised to meet current compliance requirements. Barney Drain, MD Barney Drain MD, MD 09/27/2017 2:00:19 PM This report has been signed electronically. Number of Addenda: 0

## 2017-09-27 NOTE — Discharge Instructions (Signed)
You had 13 small colon polyps removed and 10 larger polyps removed via endoscopic mucosal resection. You have SMALL internal hemorrhoids and MILD DIVERTICULOSIS IN YOUR LEFT COLON. YOU HAVE A FLOPPY LEFT COLON.   CONTINUE YOUR WEIGHT LOSS EFFORTS. YOU SHOULD LOSE WEIGHT.  WHILE I DO NOT WANT TO ALARM YOU, YOUR BODY MASS INDEX IS OVER 40 WHICH MEANS YOU ARE MORBIDLY OBESE. OBESITY CAN ACTIVATE CANCER GENES. OBESITY IS ASSOCIATED WITH AN INCREASED RISK FOR CIRRHOSIS AND ALL CANCERS, INCLUDING ESOPHAGEAL AND COLON CANCER.  DRINK WATER TO KEEP YOUR URINE LIGHT YELLOW.  FOLLOW A HIGH FIBER DIET. AVOID ITEMS THAT CAUSE BLOATING & GAS. SEE INFO BELOW.  YOUR BIOPSY RESULTS WILL BE AVAILABLE IN MY CHART AFTER DEC 1 AND MY OFFICE WILL CONTACT YOU IN 10-14 DAYS WITH YOUR RESULTS.   Next colonoscopy in 1-3 years.    Colonoscopy Care After Read the instructions outlined below and refer to this sheet in the next week. These discharge instructions provide you with general information on caring for yourself after you leave the hospital. While your treatment has been planned according to the most current medical practices available, unavoidable complications occasionally occur. If you have any problems or questions after discharge, call DR. Neenah Canter, [phone].  ACTIVITY  You may resume your regular activity, but move at a slower pace for the next 24 hours.   Take frequent rest periods for the next 24 hours.   Walking will help get rid of the air and reduce the bloated feeling in your belly (abdomen).   No driving for 24 hours (because of the medicine (anesthesia) used during the test).   You may shower.   Do not sign any important legal documents or operate any machinery for 24 hours (because of the anesthesia used during the test).    NUTRITION  Drink plenty of fluids.   You may resume your normal diet as instructed by your doctor.   Begin with a light meal and progress to your normal diet.  Heavy or fried foods are harder to digest and may make you feel sick to your stomach (nauseated).   Avoid alcoholic beverages for 24 hours or as instructed.    MEDICATIONS  You may resume your normal medications.   WHAT YOU CAN EXPECT TODAY  Some feelings of bloating in the abdomen.   Passage of more gas than usual.   Spotting of blood in your stool or on the toilet paper  .  IF YOU HAD POLYPS REMOVED DURING THE COLONOSCOPY:  Eat a soft diet IF YOU HAVE NAUSEA, BLOATING, ABDOMINAL PAIN, OR VOMITING.    FINDING OUT THE RESULTS OF YOUR TEST Not all test results are available during your visit. DR. Oneida Alar WILL CALL YOU WITHIN 14 DAYS OF YOUR PROCEDUE WITH YOUR RESULTS. Do not assume everything is normal if you have not heard from DR. Cayetano Mikita, CALL HER OFFICE AT [phone].  SEEK IMMEDIATE MEDICAL ATTENTION AND CALL THE OFFICE: [phone] IF:  You have more than a spotting of blood in your stool.   Your belly is swollen (abdominal distention).   You are nauseated or vomiting.   You have a temperature over 101F.   You have abdominal pain or discomfort that is severe or gets worse throughout the day.   High-Fiber Diet A high-fiber diet changes your normal diet to include more whole grains, legumes, fruits, and vegetables. Changes in the diet involve replacing refined carbohydrates with unrefined foods. The calorie level of the diet is essentially  unchanged. The Dietary Reference Intake (recommended amount) for adult males is 38 grams per day. For adult females, it is 25 grams per day. Pregnant and lactating women should consume 28 grams of fiber per day. Fiber is the intact part of a plant that is not broken down during digestion. Functional fiber is fiber that has been isolated from the plant to provide a beneficial effect in the body. PURPOSE  Increase stool bulk.   Ease and regulate bowel movements.   Lower cholesterol.   REDUCE RISK OF COLON CANCER  INDICATIONS  THAT YOU NEED MORE FIBER  Constipation and hemorrhoids.   Uncomplicated diverticulosis (intestine condition) and irritable bowel syndrome.   Weight management.   As a protective measure against hardening of the arteries (atherosclerosis), diabetes, and cancer.   GUIDELINES FOR INCREASING FIBER IN THE DIET  Start adding fiber to the diet slowly. A gradual increase of about 5 more grams (2 slices of whole-wheat bread, 2 servings of most fruits or vegetables, or 1 bowl of high-fiber cereal) per day is best. Too rapid an increase in fiber may result in constipation, flatulence, and bloating.   Drink enough water and fluids to keep your urine clear or pale yellow. Water, juice, or caffeine-free drinks are recommended. Not drinking enough fluid may cause constipation.   Eat a variety of high-fiber foods rather than one type of fiber.   Try to increase your intake of fiber through using high-fiber foods rather than fiber pills or supplements that contain small amounts of fiber.   The goal is to change the types of food eaten. Do not supplement your present diet with high-fiber foods, but replace foods in your present diet.   INCLUDE A VARIETY OF FIBER SOURCES  Replace refined and processed grains with whole grains, canned fruits with fresh fruits, and incorporate other fiber sources. White rice, white breads, and most bakery goods contain little or no fiber.   Brown whole-grain rice, buckwheat oats, and many fruits and vegetables are all good sources of fiber. These include: broccoli, Brussels sprouts, cabbage, cauliflower, beets, sweet potatoes, white potatoes (skin on), carrots, tomatoes, eggplant, squash, berries, fresh fruits, and dried fruits.   Cereals appear to be the richest source of fiber. Cereal fiber is found in whole grains and bran. Bran is the fiber-rich outer coat of cereal grain, which is largely removed in refining. In whole-grain cereals, the bran remains. In breakfast  cereals, the largest amount of fiber is found in those with "bran" in their names. The fiber content is sometimes indicated on the label.   You may need to include additional fruits and vegetables each day.   In baking, for 1 cup white flour, you may use the following substitutions:   1 cup whole-wheat flour minus 2 tablespoons.   1/2 cup white flour plus 1/2 cup whole-wheat flour.   Polyps, Colon  A polyp is extra tissue that grows inside your body. Colon polyps grow in the large intestine. The large intestine, also called the colon, is part of your digestive system. It is a long, hollow tube at the end of your digestive tract where your body makes and stores stool. Most polyps are not dangerous. They are benign. This means they are not cancerous. But over time, some types of polyps can turn into cancer. Polyps that are smaller than a pea are usually not harmful. But larger polyps could someday become or may already be cancerous. To be safe, doctors remove all polyps and test them.  WHO GETS POLYPS? Anyone can get polyps, but certain people are more likely than others. You may have a greater chance of getting polyps if:  You are over 50.   You have had polyps before.   Someone in your family has had polyps.   Someone in your family has had cancer of the large intestine.   Find out if someone in your family has had polyps. You may also be more likely to get polyps if you:   Eat a lot of fatty foods   Smoke   Drink alcohol   Do not exercise  Eat too much   PREVENTION There is not one sure way to prevent polyps. You might be able to lower your risk of getting them if you:  Eat more fruits and vegetables and less fatty food.   Do not smoke.   Avoid alcohol.   Exercise every day.   Lose weight if you are overweight.   Eating more calcium and folate can also lower your risk of getting polyps. Some foods that are rich in calcium are milk, cheese, and broccoli. Some foods that  are rich in folate are chickpeas, kidney beans, and spinach.   Hemorrhoids Hemorrhoids are dilated (enlarged) veins around the rectum. Sometimes clots will form in the veins. This makes them swollen and painful. These are called thrombosed hemorrhoids. Causes of hemorrhoids include:  Constipation.   Straining to have a bowel movement.   HEAVY LIFTING  HOME CARE INSTRUCTIONS  Eat a well balanced diet and drink 6 to 8 glasses of water every day to avoid constipation. You may also use a bulk laxative.   Avoid straining to have bowel movements.   Keep anal area dry and clean.   Do not use a donut shaped pillow or sit on the toilet for long periods. This increases blood pooling and pain.   Move your bowels when your body has the urge; this will require less straining and will decrease pain and pressure.

## 2017-09-27 NOTE — Transfer of Care (Addendum)
Immediate Anesthesia Transfer of Care Note  Patient: [name]  Procedure(s) Performed: COLONOSCOPY WITH PROPOFOL (N/A ) POLYPECTOMY  Patient Location: PACU  Anesthesia Type:MAC  Level of Consciousness: awake, alert  and patient cooperative  Airway & Oxygen Therapy: Patient Spontanous Breathing Nasal cannula  Post-op Assessment: Report given to RN and Post -op Vital signs reviewed and stable  Post vital signs: Reviewed and stable  Last Vitals:  Vitals:   09/27/17 1150 09/27/17 1155  BP: 124/83 139/68  Pulse:    Resp: (!) 23 (!) 30  Temp:    SpO2: 95% 95%    Last Pain:  Vitals:   09/27/17 1016  TempSrc: Oral      Patients Stated Pain Goal: 9 (36/72/55 0016)  Complications: No apparent anesthesia complications

## 2017-09-27 NOTE — Anesthesia Procedure Notes (Signed)
Procedure Name: MAC Date/Time: 09/27/2017 11:54 AM Performed by: Vista Deck, CRNA Pre-anesthesia Checklist: Patient identified, Emergency Drugs available, Suction available, Timeout performed and Patient being monitored Patient Re-evaluated:Patient Re-evaluated prior to induction Oxygen Delivery Method: Non-rebreather mask

## 2017-09-27 NOTE — Anesthesia Preprocedure Evaluation (Signed)
Anesthesia Evaluation  Patient identified by MRN, date of birth, ID band Patient awake    Reviewed: Allergy & Precautions, H&P , NPO status , Patient's Chart, lab work & pertinent test results, reviewed documented beta blocker date and time   History of Anesthesia Complications Negative for: history of anesthetic complications  Airway Mallampati: II  TM Distance: >3 FB     Dental  (+) Edentulous Upper, Partial Lower   Pulmonary shortness of breath, sleep apnea and Continuous Positive Airway Pressure Ventilation , COPD,  COPD inhaler, Current Smoker,    + rhonchi        Cardiovascular hypertension, Pt. on medications + angina with exertion + CAD, + Past MI, + CABG, + Orthopnea and + DOE  + dysrhythmias Atrial Fibrillation  Rhythm:Regular Rate:Normal     Neuro/Psych PSYCHIATRIC DISORDERS Depression    GI/Hepatic GERD (barrett's esophagus)  Medicated,  Endo/Other  diabetes  Renal/GU Renal disease     Musculoskeletal   Abdominal   Peds  Hematology   Anesthesia Other Findings   Reproductive/Obstetrics                             Anesthesia Physical Anesthesia Plan  ASA: IV  Anesthesia Plan: MAC   Post-op Pain Management:    Induction:   PONV Risk Score and Plan:   Airway Management Planned: Simple Face Mask  Additional Equipment:   Intra-op Plan:   Post-operative Plan:   Informed Consent: I have reviewed the patients History and Physical, chart, labs and discussed the procedure including the risks, benefits and alternatives for the proposed anesthesia with the patient or authorized representative who has indicated his/her understanding and acceptance.     Plan Discussed with:   Anesthesia Plan Comments: (preop lopressor ordered.)        Anesthesia Quick Evaluation

## 2017-09-30 ENCOUNTER — Telehealth: Payer: Self-pay | Admitting: Gastroenterology

## 2017-09-30 ENCOUNTER — Encounter (HOSPITAL_COMMUNITY): Payer: Self-pay | Admitting: Gastroenterology

## 2017-09-30 NOTE — Telephone Encounter (Signed)
Pt is aware.  

## 2017-09-30 NOTE — Telephone Encounter (Signed)
Please call pt. HE had 12 simple adenomas and one hyperplastic polyp removed.     CONTINUE YOUR WEIGHT LOSS EFFORTS.  DRINK WATER TO KEEP YOUR URINE LIGHT YELLOW.  FOLLOW A HIGH FIBER DIET. AVOID ITEMS THAT CAUSE BLOATING & GAS.  Next colonoscopy in 3 years.

## 2017-10-03 ENCOUNTER — Ambulatory Visit: Payer: Medicare HMO | Admitting: "Endocrinology

## 2017-10-03 NOTE — Telephone Encounter (Signed)
Reminder in epic °

## 2017-10-20 DIAGNOSIS — G4733 Obstructive sleep apnea (adult) (pediatric): Secondary | ICD-10-CM | POA: Diagnosis not present

## 2017-10-20 DIAGNOSIS — J449 Chronic obstructive pulmonary disease, unspecified: Secondary | ICD-10-CM | POA: Diagnosis not present

## 2017-10-20 DIAGNOSIS — I25119 Atherosclerotic heart disease of native coronary artery with unspecified angina pectoris: Secondary | ICD-10-CM | POA: Diagnosis not present

## 2017-10-20 DIAGNOSIS — I509 Heart failure, unspecified: Secondary | ICD-10-CM | POA: Diagnosis not present

## 2017-11-03 DIAGNOSIS — R0902 Hypoxemia: Secondary | ICD-10-CM | POA: Diagnosis not present

## 2017-11-03 DIAGNOSIS — J449 Chronic obstructive pulmonary disease, unspecified: Secondary | ICD-10-CM | POA: Diagnosis not present

## 2017-11-11 DIAGNOSIS — Z6841 Body Mass Index (BMI) 40.0 and over, adult: Secondary | ICD-10-CM | POA: Diagnosis not present

## 2017-11-11 DIAGNOSIS — I2510 Atherosclerotic heart disease of native coronary artery without angina pectoris: Secondary | ICD-10-CM | POA: Diagnosis not present

## 2017-11-11 DIAGNOSIS — E669 Obesity, unspecified: Secondary | ICD-10-CM | POA: Diagnosis not present

## 2017-11-11 DIAGNOSIS — E1151 Type 2 diabetes mellitus with diabetic peripheral angiopathy without gangrene: Secondary | ICD-10-CM | POA: Diagnosis not present

## 2017-11-11 DIAGNOSIS — G4733 Obstructive sleep apnea (adult) (pediatric): Secondary | ICD-10-CM | POA: Diagnosis not present

## 2017-11-11 DIAGNOSIS — I25700 Atherosclerosis of coronary artery bypass graft(s), unspecified, with unstable angina pectoris: Secondary | ICD-10-CM | POA: Diagnosis not present

## 2017-11-11 DIAGNOSIS — I200 Unstable angina: Secondary | ICD-10-CM | POA: Diagnosis not present

## 2017-11-17 ENCOUNTER — Encounter: Payer: Self-pay | Admitting: "Endocrinology

## 2017-11-17 ENCOUNTER — Other Ambulatory Visit: Payer: Self-pay

## 2017-11-17 ENCOUNTER — Encounter: Payer: Self-pay | Admitting: Nutrition

## 2017-11-17 ENCOUNTER — Encounter: Payer: Medicare HMO | Attending: Family Medicine | Admitting: Nutrition

## 2017-11-17 ENCOUNTER — Ambulatory Visit (INDEPENDENT_AMBULATORY_CARE_PROVIDER_SITE_OTHER): Payer: Medicare HMO | Admitting: "Endocrinology

## 2017-11-17 VITALS — Ht 70.0 in | Wt 300.0 lb

## 2017-11-17 VITALS — BP 152/80 | HR 97 | Ht 70.0 in | Wt 300.0 lb

## 2017-11-17 DIAGNOSIS — E1159 Type 2 diabetes mellitus with other circulatory complications: Secondary | ICD-10-CM | POA: Insufficient documentation

## 2017-11-17 DIAGNOSIS — Z713 Dietary counseling and surveillance: Secondary | ICD-10-CM | POA: Insufficient documentation

## 2017-11-17 DIAGNOSIS — E782 Mixed hyperlipidemia: Secondary | ICD-10-CM

## 2017-11-17 DIAGNOSIS — E1165 Type 2 diabetes mellitus with hyperglycemia: Secondary | ICD-10-CM

## 2017-11-17 DIAGNOSIS — IMO0002 Reserved for concepts with insufficient information to code with codable children: Secondary | ICD-10-CM

## 2017-11-17 DIAGNOSIS — E118 Type 2 diabetes mellitus with unspecified complications: Secondary | ICD-10-CM

## 2017-11-17 DIAGNOSIS — I10 Essential (primary) hypertension: Secondary | ICD-10-CM

## 2017-11-17 MED ORDER — INSULIN ISOPHANE & REGULAR (HUMAN 70-30)100 UNIT/ML KWIKPEN: 30.0000 [IU] | PEN_INJECTOR | Freq: Two times a day (BID) | SUBCUTANEOUS | 2 refills | Status: DC

## 2017-11-17 MED ORDER — GLUCOSE BLOOD VI STRP: ORAL_STRIP | 5 refills | Status: DC

## 2017-11-17 NOTE — Patient Instructions (Signed)

## 2017-11-17 NOTE — Progress Notes (Signed)
  Medical Nutrition Therapy:  Appt start time: 1430 end time:  6599.   Assessment:  Primary concerns today: Diabetes Type 2, Obesity. F/u    Trying to situated on CPAP machine with oxygen Feels depressed because he can't do anything. Would like to try. Saw  Dr. Dorris Fetch today.  Relion  70/30 ml BID   Lab Results  Component Value Date   HGBA1C 9.4 05/23/2017   CMP Latest Ref Rng & Units 09/20/2017 02/18/2017 08/23/2016  Glucose 65 - 99 mg/dL 240(H) - 164(H)  BUN 6 - 20 mg/dL 13 7 10   Creatinine 0.61 - 1.24 mg/dL 0.96 0.7 0.97  Sodium 135 - 145 mmol/L 138 - 136  Potassium 3.5 - 5.1 mmol/L 3.4(L) - 3.9  Chloride 101 - 111 mmol/L 102 - 101  CO2 22 - 32 mmol/L 26 - 26  Calcium 8.9 - 10.3 mg/dL 9.1 - 9.6  Total Protein 6.5 - 8.1 g/dL - - -  Total Bilirubin 0.3 - 1.2 mg/dL - - -  Alkaline Phos 38 - 126 U/L - - -  AST 15 - 41 U/L - - -  ALT 17 - 63 U/L - - -   Lipid Panel     Component Value Date/Time   CHOL 136 02/18/2017   TRIG 559 (A) 02/18/2017   HDL 28 (A) 02/18/2017   CHOLHDL 6.1 04/28/2014 0121   VLDL 61 (H) 04/28/2014 0121   LDLCALC 47 04/28/2014 0121      Preferred Learning Style:  Auditory  Visual  Hands on  Learning Readiness:  Ready  Change in progress   MEDICATIONS: See list   DIETARY INTAKE:   24-hr recall:  B ( AM): Egg and 2 slice toast and water Snk ( AM):  L ( PM): Grilled chicken, vegetables, water Snk ( PM): D ( PM): Banana sandwich sandwich, Coke Snk ( PM):  Beverages: water,   Usual physical activity: ADL  Estimated energy needs: 1800  calories 200  g carbohydrates 135 g protein 50 g fat  Progress Towards Goal(s):  In progress.   Nutritional Diagnosis:  NB-1.1 Food and nutrition-related knowledge deficit As related to Diabetes.  As evidenced by A1C 9.4%   Intervention:  Nutrition and Diabetes education provided on My Plate, CHO counting, meal planning, portion sizes, timing of meals, avoiding snacks between meals unless  having a low blood sugar, target ranges for A1C and blood sugars, signs/symptoms and treatment of hyper/hypoglycemia, monitoring blood sugars, taking medications as prescribed, benefits of exercising 30 minutes per day and prevention of complications of DM.   Goals  Eat breakfast by 9;30 am Take 30 units of 70/30 insulin twice a day; before breakfast and b before dinner Test blood sugars 4 times per day and record on sheets. Avoid processed meats; cut out hot dogs, bacon, sausage, biscuits Increase fresh fruits and vegetables Drink more water  Teaching Method Utilized: Visual Auditory Hands on  Handouts given during visit include:  The Plate Method   Meal Plan Card  Diabetes Instructions   Barriers to learning/adherence to lifestyle change: none  Demonstrated degree of understanding via:  Teach Back   Monitoring/Evaluation:  Dietary intake, exercise, meal planning , and body weight in 1 week.

## 2017-11-17 NOTE — Patient Instructions (Addendum)
Goals  Eat breakfast by 9;30 am Take 30 units of 70/30 insulin twice a day; before breakfast and b before dinner Test blood sugars 4 times per day and record on sheets. Avoid processed meats; cut out hot dogs, bacon, sausage, biscuits Increase fresh fruits and vegetables Drink more water

## 2017-11-17 NOTE — Progress Notes (Signed)
Subjective:    Patient ID: [name], male    DOB: 19-Apr-1961.  he is being seen in follow-up for management of currently uncontrolled symptomatic diabetes requested by  Sharilyn Sites, MD.   Past Medical History:  Diagnosis Date  . Abnormal myocardial perfusion study 01/01/2011   there a small to moderate sized inferobasal scar  . Barrett's esophagus   . CAD (coronary artery disease) 2008   bypass grafting   . Cataracts, bilateral   . Chronic kidney disease    hx of kidney stones  . Claudication ([place]) 11/16/2011   PV test perform shows normal  . COPD (chronic obstructive pulmonary disease) ([place])   . Depression   . Diabetes ([place])    type 2 diabetes mellitus  . Dysrhythmia   . GERD (gastroesophageal reflux disease)   . Glaucoma   . Hernia of abdominal wall   . HTN (hypertension)   . Hyperlipidemia   . Morbid obesity ([place])   . Myocardial infarction ([place]) 2008,2009,2009  . OSA (obstructive sleep apnea)    on cpap  . PAF (paroxysmal atrial fibrillation) ([place])   . S/P colonoscopy 2009   3-4 mm transverse colon erosions likely secondary to  ASA  . S/P endoscopy Dec 2011   moderate erosive gastritis, Barrett's esophagus 1-2cm  . Sleep apnea   . SOB (shortness of breath) 11/03/2007   2D Echo EF 50%-55%  . Stroke [facility])    Past Surgical History:  Procedure Laterality Date  . BIOPSY N/A 04/29/2015   Procedure: BIOPSY;  Surgeon: Danie Binder, MD;  Location: [facility] ORS;  Service: Endoscopy;  Laterality: N/A;  . CABG X 4  03/2008  . CARDIAC CATHETERIZATION  2009   stent placement to the left circumflex a 2.25   . CARDIAC CATHETERIZATION  07/08/2010  . COLONOSCOPY WITH PROPOFOL N/A 09/27/2017   Procedure: COLONOSCOPY WITH PROPOFOL;  Surgeon: Danie Binder, MD;  Location: [facility] SU[facility];  Service: Endoscopy;  Laterality: N/A;  12:00pm  . CORONARY ARTERY BYPASS GRAFT  2008   4 vessels  . CORONARY STENT PLACEMENT  12/29/12  . CORONARY STENT PLACEMENT  12/2012  .  ESOPHAGEAL DILATION N/A 04/29/2015   Procedure: ESOPHAGEAL DILATION 15 mm, 16 mm;  Surgeon: Danie Binder, MD;  Location: [facility];  Service: Endoscopy;  Laterality: N/A;  . ESOPHAGOGASTRODUODENOSCOPY  09/2011   Barrett's esophagus, no dysplasia on biopsies. Distal esophagitis. Status post dilation. Moderate gastritis and duodenitis, but biopsies benign. Next EGD in November 2015 for surveillance of Barrett's esophagus.  . ESOPHAGOGASTRODUODENOSCOPY (EGD) WITH PROPOFOL N/A 04/29/2015   SLF: 1. Barretts esophagus 2. Moderate non-erosive gastritis.   Marland Kitchen HERNIA REPAIR     ventral hernia repair  . LEFT HEART CATHETERIZATION WITH CORONARY/GRAFT ANGIOGRAM N/A 12/29/2012   Procedure: LEFT HEART CATHETERIZATION WITH Beatrix Fetters;  Surgeon: Troy Sine, MD;  Location: [facility] CATH LAB;  Service: Cardiovascular;  Laterality: N/A;  . ORIF FIBULA FRACTURE Right 06/03/2015   Procedure: OPEN REDUCTION INTERNAL FIXATION (ORIF) DISTAL FIBULA  FRACTURE;  Surgeon: Garald Balding, MD;  Location: [place];  Service: Orthopedics;  Laterality: Right;  . PERCUTANEOUS CORONARY STENT INTERVENTION (PCI-S)  12/29/2012   Procedure: PERCUTANEOUS CORONARY STENT INTERVENTION (PCI-S);  Surgeon: Troy Sine, MD;  Location: [facility] CATH LAB;  Service: Cardiovascular;;  . POLYPECTOMY  09/27/2017   Procedure: POLYPECTOMY;  Surgeon: Danie Binder, MD;  [facility];  Service: Endoscopy;;  cecal polyp, ascending polyps x6, transverse colon  polyps x6, splenic flexure polyps x2, descending colon polyps x6, sigmoid  colon polyp x1, rectal polyp x1   . SAVORY DILATION  09/06/2011   Procedure: SAVORY DILATION;  Surgeon: Dorothyann Peng, MD;  Location: [facility];  Service: Endoscopy;  Laterality: N/A;  Dilated with 60mm   Social History   Socioeconomic History  . Marital status: Divorced    Spouse name: None  . Number of children: None  . Years of education: None  . Highest education level: None  Social Needs  . Financial  resource strain: None  . Food insecurity - worry: None  . Food insecurity - inability: None  . Transportation needs - medical: None  . Transportation needs - non-medical: None  Occupational History  . None  Tobacco Use  . Smoking status: Current Every Day Smoker    Packs/day: 0.50    Years: 30.00    Pack years: 15.00    Types: Cigarettes  . Smokeless tobacco: Never Used  Substance and Sexual Activity  . Alcohol use: Yes    Comment: occ  . Drug use: No  . Sexual activity: No  Other Topics Concern  . None  Social History Narrative  . None   Outpatient Encounter Medications as of 11/17/2017  Medication Sig  . amLODipine (NORVASC) 10 MG tablet Take 1 tablet (10 mg total) by mouth daily.  Marland Kitchen apixaban (ELIQUIS) 5 MG TABS tablet Take 1 tablet (5 mg total) by mouth 2 (two) times daily.  . budesonide-formoterol (SYMBICORT) 160-4.5 MCG/ACT inhaler Inhale 1 puff daily into the lungs.   . Choline Fenofibrate (TRILIPIX) 135 MG capsule Take 135 mg by mouth at bedtime.   . diazepam (VALIUM) 5 MG tablet Take 5 mg by mouth 2 (two) times daily.  Marland Kitchen escitalopram (LEXAPRO) 20 MG tablet Take 20 mg daily by mouth.   . Fluticasone-Umeclidin-Vilant (TRELEGY ELLIPTA) 100-62.5-25 MCG/INH AEPB Inhale 1 puff daily into the lungs.   . furosemide (LASIX) 80 MG tablet Take 80 mg by mouth daily.  . Insulin Isophane & Regular Human (NOVOLIN 70/30 FLEXPEN RELION) (70-30) 100 UNIT/ML PEN Inject 30 Units into the skin 2 (two) times daily before a meal.  . Insulin Pen Needle (B-D ULTRAFINE III SHORT PEN) 31G X 8 MM MISC 1 each by Does not apply route as directed. (Patient not taking: Reported on 09/12/2017)  . isosorbide mononitrate (IMDUR) 60 MG 24 hr tablet Take 1 tablet (60 mg total) by mouth 3 (three) times daily. Please make an appointment for additional refills (Patient taking differently: Take 60 mg by mouth 3 (three) times daily. )  . lisinopril (PRINIVIL,ZESTRIL) 5 MG tablet Take 5 mg by mouth daily.  .  metoprolol tartrate (LOPRESSOR) 100 MG tablet Take 1 tablet (100 mg total) by mouth 2 (two) times daily.  . modafinil (PROVIGIL) 100 MG tablet Take 100 mg daily by mouth.   . nitroGLYCERIN (NITROLINGUAL) 0.4 MG/SPRAY spray Place 1 spray under the tongue every 5 (five) minutes x 3 doses as needed for chest pain.  . pantoprazole (PROTONIX) 40 MG tablet TAKE 1 TABLET BY MOUTH 30 MINUTES BEFORE BREAKFAST (Patient taking differently: TAKE 40 MG BY MOUTH 30 MINUTES BEFORE BREAKFAST)  . polyethylene glycol-electrolytes (TRILYTE) 420 g solution Take 4,000 mLs by mouth as directed.  . potassium chloride SA (K-DUR,KLOR-CON) 20 MEQ tablet Take 1 tablet (20 mEq total) by mouth 2 (two) times daily.  . ranitidine (ZANTAC) 150 MG capsule Take 150 mg at bedtime by mouth.   . rosuvastatin (  CRESTOR) 20 MG tablet Take 20 mg by mouth daily.  . sitaGLIPtin (JANUVIA) 100 MG tablet Take 25 mg by mouth daily with breakfast.  . [DISCONTINUED] glucose blood (ONETOUCH VERIO) test strip Use as instructed qid. e11.65 (Patient not taking: Reported on 09/12/2017)  . [DISCONTINUED] Insulin Degludec (TRESIBA FLEXTOUCH) 200 UNIT/ML SOPN Inject 60 Units into the skin at bedtime. (Patient not taking: Reported on 11/17/2017)   No facility-administered encounter medications on file as of 11/17/2017.     ALLERGIES: Allergies  Allergen Reactions  . Contrast Media [Iodinated Diagnostic Agents] Other (See Comments)    Pt must be premedicated before given contrast media - stops heart  . Iohexol Other (See Comments)     Consult with radiologist before pre meds are given.Desc: PT. STATES HEART STOPPED HAS TO BE PREMED.     VACCINATION STATUS:  There is no immunization history on file for this patient.  Diabetes  He presents for his follow-up diabetic visit. He has type 2 diabetes mellitus. Onset time: He was diagnosed at approximate age of 68 years. His disease course has been worsening. There are no hypoglycemic associated  symptoms. Pertinent negatives for hypoglycemia include no confusion, headaches, pallor or seizures. Associated symptoms include blurred vision, polydipsia and polyuria. Pertinent negatives for diabetes include no chest pain, no fatigue, no polyphagia and no weakness. There are no hypoglycemic complications. Symptoms are worsening. Diabetic complications include a CVA and heart disease. Risk factors for coronary artery disease include diabetes mellitus, dyslipidemia, hypertension, male sex, obesity, tobacco exposure, sedentary lifestyle and family history. Current diabetic treatments: He was supposed to be on basal insulin, unfortunately he ran out and could not afford refills and he did not call clinic for alternatives. He is compliant with treatment none of the time. His weight is increasing steadily. He is following a generally unhealthy diet. When asked about meal planning, he reported none. He has not had a previous visit with a dietitian. He never participates in exercise. His overall blood glucose range is >200 mg/dl. (He came with a random blood glucose monitoring averaging more than 250 mg/dL.   ) An ACE inhibitor/angiotensin II receptor blocker is being taken. He does not see a podiatrist.Eye exam is not current.  Hyperlipidemia  This is a chronic problem. The current episode started more than 1 year ago. The problem is uncontrolled. Recent lipid tests were reviewed and are high. Exacerbating diseases include diabetes and obesity. Pertinent negatives include no chest pain, myalgias or shortness of breath. Current antihyperlipidemic treatment includes statins. Compliance problems include medication cost and psychosocial issues.  Risk factors for coronary artery disease include dyslipidemia, diabetes mellitus, hypertension, male sex, obesity, family history and a sedentary lifestyle.  Hypertension  This is a chronic problem. The current episode started more than 1 year ago. The problem is uncontrolled.  Associated symptoms include blurred vision. Pertinent negatives include no chest pain, headaches, neck pain, palpitations or shortness of breath. Risk factors for coronary artery disease include diabetes mellitus, dyslipidemia, male gender, obesity, family history, sedentary lifestyle and smoking/tobacco exposure. Past treatments include ACE inhibitors. Hypertensive end-organ damage includes CVA.    Review of Systems  Constitutional: Negative for chills, fatigue, fever and unexpected weight change.  HENT: Negative for dental problem, mouth sores and trouble swallowing.   Eyes: Positive for blurred vision. Negative for visual disturbance.  Respiratory: Negative for cough, choking, chest tightness, shortness of breath and wheezing.   Cardiovascular: Negative for chest pain, palpitations and leg swelling.  Gastrointestinal: Negative for  abdominal distention, abdominal pain, constipation, diarrhea, nausea and vomiting.  Endocrine: Positive for polydipsia and polyuria. Negative for polyphagia.  Genitourinary: Negative for dysuria, flank pain, hematuria and urgency.  Musculoskeletal: Negative for back pain, gait problem, myalgias and neck pain.  Skin: Negative for pallor, rash and wound.  Neurological: Negative for seizures, syncope, weakness, numbness and headaches.  Psychiatric/Behavioral: Negative.  Negative for confusion and dysphoric mood.    Objective:    BP (!) 152/80   Pulse 97   Ht 5\' 10"  (1.778 m)   Wt 300 lb (136.1 kg)   BMI 43.05 kg/m   Wt Readings from Last 3 Encounters:  11/17/17 300 lb (136.1 kg)  11/17/17 300 lb (136.1 kg)  09/27/17 292 lb (132.5 kg)     Physical Exam  Constitutional: He is oriented to person, place, and time. He appears well-developed. He is cooperative. No distress.  HENT:  Head: Normocephalic and atraumatic.  Eyes: EOM are normal.  Neck: Normal range of motion. Neck supple. No tracheal deviation present. No thyromegaly present.  Cardiovascular:  Normal rate, S1 normal, S2 normal and normal heart sounds. Exam reveals no gallop.  No murmur heard. Pulses:      Dorsalis pedis pulses are 1+ on the right side, and 1+ on the left side.       Posterior tibial pulses are 1+ on the right side, and 1+ on the left side.  Pulmonary/Chest: Breath sounds normal. No respiratory distress. He has no wheezes.  Abdominal: Soft. Bowel sounds are normal. He exhibits no distension. There is no tenderness. There is no guarding and no CVA tenderness.  Musculoskeletal: He exhibits no edema.       Right shoulder: He exhibits no swelling and no deformity.  Neurological: He is alert and oriented to person, place, and time. He has normal strength and normal reflexes. No cranial nerve deficit or sensory deficit. Gait normal.  Skin: Skin is warm and dry. No rash noted. No cyanosis. Nails show no clubbing.  Psychiatric: His speech is normal. Cognition and memory are normal.  Reluctant affect. Unconcerned attitude.   CMP ( most recent) CMP     Component Value Date/Time   NA 138 09/20/2017 1009   K 3.4 (L) 09/20/2017 1009   CL 102 09/20/2017 1009   CO2 26 09/20/2017 1009   GLUCOSE 240 (H) 09/20/2017 1009   BUN 13 09/20/2017 1009   BUN 7 02/18/2017   CREATININE 0.96 09/20/2017 1009   CALCIUM 9.1 09/20/2017 1009   PROT 6.4 (L) 06/02/2015 1526   ALBUMIN 3.4 (L) 06/02/2015 1526   AST 28 06/02/2015 1526   ALT 31 06/02/2015 1526   ALKPHOS 65 06/02/2015 1526   BILITOT 0.7 06/02/2015 1526   GFRNONAA >60 09/20/2017 1009   GFRAA >60 09/20/2017 1009   Diabetic Labs (most recent): Lab Results  Component Value Date   HGBA1C 9.4 05/23/2017   HGBA1C 10.6 02/18/2017   HGBA1C 6.7 (H) 06/02/2015     Lipid Panel ( most recent) Lipid Panel     Component Value Date/Time   CHOL 136 02/18/2017   TRIG 559 (A) 02/18/2017   HDL 28 (A) 02/18/2017   CHOLHDL 6.1 04/28/2014 0121   VLDL 61 (H) 04/28/2014 0121   LDLCALC 47 04/28/2014 0121     Assessment & Plan:   1.  DM type 2 causing vascular disease ([place])  - Patient has currently uncontrolled symptomatic type 2 DM since  57 years of age. - He came with no labs, his  most recent A1c was 9.4% October 2018. - He ran out of his basal insulin Tyler Aas several weeks ago and did not call clinic for alternative prescription. - He'll be sent  for new set of labs today.  -his diabetes is complicated by coronary artery disease, CVA, obesity/sedentary life, chronic heavy smoking and ELLEN MAYOL remains at a high risk for more acute and chronic complications which include CAD, CVA, CKD, retinopathy, and neuropathy. These are all discussed in detail with the patient.  - I have counseled him on diet management and weight loss, by adopting a carbohydrate restricted/protein rich diet.  -  Suggestion is made for him to avoid simple carbohydrates  from his diet including Cakes, Sweet Desserts / Pastries, Ice Cream, Soda (diet and regular), Sweet Tea, Candies, Chips, Cookies, Store Bought Juices, Alcohol in Excess of  1-2 drinks a day, Artificial Sweeteners, and "Sugar-free" Products. This will help patient to have stable blood glucose profile and potentially avoid unintended weight gain.   - I encouraged him to switch to  unprocessed or minimally processed complex starch and increased protein intake (animal or plant source), fruits, and vegetables.  - he is advised to stick to a routine mealtimes to eat 3 meals  a day and avoid unnecessary snacks ( to snack only to correct hypoglycemia).   - I have approached him with the following individualized plan to manage diabetes and patient agrees:   - This patient will  need  Basal/bolus insulin  treatment to achieve control of his diabetes to target, however, unfortunately he is not affording insulin analogs, and he is too high risk to give basal/bolus insulin at this time.   - Since he could not afford to refill Tresiba ,  I discussed and initiated no voiding 70/30 30 units  twice a day with meals (breakfast and supper), associated with strict monitoring of blood glucose 4 times a day-before meals and at bedtime and return in one week with his meter and logs for reevaluation.   - I I'll proceed to lower his Januvia to 25 mg by mouth daily with breakfast.  - He reports intolerance to metformin causing GI side effects.  -Patient is encouraged to call clinic for blood glucose levels less than 70 or above 300 mg /dl.  - Patient specific target  A1c;  LDL, HDL, Triglycerides, and  Waist Circumference were discussed in detail.  2) BP/HTN:   his blood pressure is not controlled to target,  I advised him to continue his current blood pressure medications consistently including  ACEI/ARB. 3) Lipids/HPL: his lipid panel is not controlled  with severe hypertriglyceridemia   of 559.   Patient is advised to continue statins.  4)  Weight/Diet: CDE Consult has been  initiated , exercise, and detailed carbohydrates information provided.  5) Chronic Care/Health Maintenance:  -he  is on ACEI/ARB and Statin medications and  is encouraged to continue to follow up with Ophthalmology, Dentist,  Podiatrist at least yearly or according to recommendations, and advised to  quit smoking. I have recommended yearly flu vaccine and pneumonia vaccination at least every 5 years; moderate intensity exercise for up to 150 minutes weekly; and  sleep for at least 7 hours a day.  - I advised patient to maintain close follow up with Sharilyn Sites, MD for primary care needs.  Follow up plan: - Return in about 1 week (around 11/24/2017) for follow up with pre-visit labs, meter, and logs, labs today.  - Time spent with  the patient: 25 min, of which >50% was spent in reviewing his blood glucose logs , discussing his hypo- and hyper-glycemic episodes, reviewing his current and  previous labs and insulin doses and developing a plan to avoid hypo- and hyper-glycemia. Please refer to Patient Instructions for  Blood Glucose Monitoring and Insulin/Medications Dosing Guide"  in media tab for additional information.  Glade Lloyd, MD Phone: [phone]  Fax: [phone]   11/17/2017, 4:48 PM   This note was partially dictated with voice recognition software. Similar sounding words can be transcribed inadequately or may not  be corrected upon review.

## 2017-11-18 ENCOUNTER — Other Ambulatory Visit: Payer: Self-pay | Admitting: "Endocrinology

## 2017-11-18 DIAGNOSIS — E782 Mixed hyperlipidemia: Secondary | ICD-10-CM | POA: Diagnosis not present

## 2017-11-18 DIAGNOSIS — E1151 Type 2 diabetes mellitus with diabetic peripheral angiopathy without gangrene: Secondary | ICD-10-CM | POA: Diagnosis not present

## 2017-11-18 DIAGNOSIS — G2581 Restless legs syndrome: Secondary | ICD-10-CM | POA: Diagnosis not present

## 2017-11-18 DIAGNOSIS — E1159 Type 2 diabetes mellitus with other circulatory complications: Secondary | ICD-10-CM | POA: Diagnosis not present

## 2017-11-18 DIAGNOSIS — G4733 Obstructive sleep apnea (adult) (pediatric): Secondary | ICD-10-CM | POA: Diagnosis not present

## 2017-11-18 DIAGNOSIS — E559 Vitamin D deficiency, unspecified: Secondary | ICD-10-CM | POA: Diagnosis not present

## 2017-11-19 LAB — COMPREHENSIVE METABOLIC PANEL
A/G RATIO: 1.7 (ref 1.2–2.2)
ALBUMIN: 4.1 g/dL (ref 3.5–5.5)
ALK PHOS: 95 IU/L (ref 39–117)
ALT: 51 IU/L — ABNORMAL HIGH (ref 0–44)
AST: 30 IU/L (ref 0–40)
BUN / CREAT RATIO: 14 (ref 9–20)
BUN: 12 mg/dL (ref 6–24)
Bilirubin Total: 0.4 mg/dL (ref 0.0–1.2)
CO2: 23 mmol/L (ref 20–29)
Calcium: 9.4 mg/dL (ref 8.7–10.2)
Chloride: 103 mmol/L (ref 96–106)
Creatinine, Ser: 0.87 mg/dL (ref 0.76–1.27)
GFR calc Af Amer: 113 mL/min/{1.73_m2} (ref >59)
GFR calc non Af Amer: 98 mL/min/{1.73_m2} (ref >59)
GLOBULIN, TOTAL: 2.4 g/dL (ref 1.5–4.5)
Glucose: 189 mg/dL — ABNORMAL HIGH (ref 65–99)
POTASSIUM: 4.4 mmol/L (ref 3.5–5.2)
Sodium: 141 mmol/L (ref 134–144)
Total Protein: 6.5 g/dL (ref 6.0–8.5)

## 2017-11-19 LAB — MICROALBUMIN, URINE: MICROALBUM., U, RANDOM: 12.1 ug/mL

## 2017-11-19 LAB — TSH: TSH: 2.13 u[IU]/mL (ref 0.450–4.500)

## 2017-11-19 LAB — LIPID PANEL W/O CHOL/HDL RATIO
CHOLESTEROL TOTAL: 137 mg/dL (ref 100–199)
HDL: 28 mg/dL — ABNORMAL LOW (ref >39)
TRIGLYCERIDES: 469 mg/dL — AB (ref 0–149)

## 2017-11-19 LAB — VITAMIN D 25 HYDROXY (VIT D DEFICIENCY, FRACTURES): VIT D 25 HYDROXY: 5.8 ng/mL — AB (ref 30.0–100.0)

## 2017-11-19 LAB — HGB A1C W/O EAG: HEMOGLOBIN A1C: 7.9 % — AB (ref 4.8–5.6)

## 2017-11-19 LAB — T4, FREE: Free T4: 1.19 ng/dL (ref 0.82–1.77)

## 2017-11-19 LAB — SPECIMEN STATUS REPORT

## 2017-11-21 ENCOUNTER — Telehealth: Payer: Self-pay

## 2017-11-21 ENCOUNTER — Other Ambulatory Visit: Payer: Self-pay | Admitting: "Endocrinology

## 2017-11-21 ENCOUNTER — Other Ambulatory Visit: Payer: Self-pay

## 2017-11-21 MED ORDER — ACCU-CHEK GUIDE W/DEVICE KIT: 1.0000 | PACK | Freq: Four times a day (QID) | 0 refills | Status: DC

## 2017-11-21 MED ORDER — VITAMIN D (ERGOCALCIFEROL) 1.25 MG (50000 UNIT) PO CAPS
50000.0000 [IU] | ORAL_CAPSULE | ORAL | 0 refills | Status: DC
Start: 2017-11-21 — End: 2018-02-10

## 2017-11-21 MED ORDER — GLUCOSE BLOOD VI STRP: ORAL_STRIP | 5 refills | Status: DC

## 2017-11-21 MED ORDER — PEN NEEDLES 32G X 6 MM MISC: 1.0000 | Freq: Four times a day (QID) | 5 refills | Status: DC

## 2017-11-21 MED ORDER — LANCETS ULTRA FINE MISC: 1.0000 | Freq: Four times a day (QID) | 5 refills | Status: DC

## 2017-11-21 NOTE — Telephone Encounter (Signed)
Yes, he can take Vitamin D3 5000 units daily x 90 days.

## 2017-11-21 NOTE — Telephone Encounter (Signed)
Pts medication will not cover Vit d 50,000 qwk. Can he take OTC and if so how many mg

## 2017-11-21 NOTE — Telephone Encounter (Signed)
Pt.notified

## 2017-11-23 ENCOUNTER — Ambulatory Visit (INDEPENDENT_AMBULATORY_CARE_PROVIDER_SITE_OTHER): Payer: Medicare HMO | Admitting: "Endocrinology

## 2017-11-23 ENCOUNTER — Encounter: Payer: Self-pay | Admitting: "Endocrinology

## 2017-11-23 ENCOUNTER — Encounter: Payer: Self-pay | Admitting: Nutrition

## 2017-11-23 ENCOUNTER — Encounter: Payer: Medicare HMO | Attending: Family Medicine | Admitting: Nutrition

## 2017-11-23 VITALS — Ht 70.0 in | Wt 298.0 lb

## 2017-11-23 VITALS — BP 143/81 | HR 75 | Ht 70.0 in | Wt 298.0 lb

## 2017-11-23 DIAGNOSIS — E1159 Type 2 diabetes mellitus with other circulatory complications: Secondary | ICD-10-CM

## 2017-11-23 DIAGNOSIS — E118 Type 2 diabetes mellitus with unspecified complications: Secondary | ICD-10-CM

## 2017-11-23 DIAGNOSIS — E1165 Type 2 diabetes mellitus with hyperglycemia: Secondary | ICD-10-CM

## 2017-11-23 DIAGNOSIS — G4714 Hypersomnia due to medical condition: Secondary | ICD-10-CM | POA: Diagnosis not present

## 2017-11-23 DIAGNOSIS — IMO0002 Reserved for concepts with insufficient information to code with codable children: Secondary | ICD-10-CM

## 2017-11-23 DIAGNOSIS — E782 Mixed hyperlipidemia: Secondary | ICD-10-CM

## 2017-11-23 DIAGNOSIS — I10 Essential (primary) hypertension: Secondary | ICD-10-CM | POA: Diagnosis not present

## 2017-11-23 DIAGNOSIS — Z713 Dietary counseling and surveillance: Secondary | ICD-10-CM | POA: Diagnosis not present

## 2017-11-23 DIAGNOSIS — G4733 Obstructive sleep apnea (adult) (pediatric): Secondary | ICD-10-CM | POA: Diagnosis not present

## 2017-11-23 DIAGNOSIS — Z79899 Other long term (current) drug therapy: Secondary | ICD-10-CM | POA: Diagnosis not present

## 2017-11-23 DIAGNOSIS — E662 Morbid (severe) obesity with alveolar hypoventilation: Secondary | ICD-10-CM | POA: Diagnosis not present

## 2017-11-23 MED ORDER — INSULIN ISOPHANE & REGULAR (HUMAN 70-30)100 UNIT/ML KWIKPEN: PEN_INJECTOR | SUBCUTANEOUS | 2 refills | Status: DC

## 2017-11-23 MED ORDER — SITAGLIPTIN PHOSPHATE 25 MG PO TABS: 25.0000 mg | ORAL_TABLET | Freq: Every day | ORAL | 3 refills | Status: DC

## 2017-11-23 NOTE — Patient Instructions (Signed)
Goals 1. Eat three meals per day. Do not skip meals 2.  Increase low carb vegetables-broccoli, greens, and low carb vegetables. 3. Keep drinking water and avoid sodas 5. Keep blood sugars and taking insulin as prescribed. Physical activity: increase walking in the house.

## 2017-11-23 NOTE — Patient Instructions (Signed)

## 2017-11-23 NOTE — Progress Notes (Signed)
Medical Nutrition Therapy:  Appt start time: 4782 end time:  1500  Assessment:  Primary concerns today: Diabetes Type 2, Obesity. F/u   Trying to situated on CPAP machine with oxygen. Saw MD today for that. Saw Dr. Luan Pulling and will order oxygen with CPAP at night.  Changes made: walking thru the house more. Has been choosing better food choices. Cut out soda, juice and drinking more water.  Cut out snacks and trying not to eat past 7 pm. Scheduled to see Dr. Dorris Fetch today.  Relion  70/30  30 units  BID BS: FBS 195-280's and before lunch 186-240's and before dinner: 180-260's and Bedtime 199-260's.  He forgot to take insulin before breakfast due to going to MD a ppt.  Lost 2 lbs. A1C down to 7.9% from 9.4% and previously 10.6%. TG elevated 469 mg/dl, HDL low 28 mg/dl and LDL elevated at 61 mg/dl. Dr. Dorris Fetch changed doses of insulin to 35 units of 70/30 in am and 30 units with supper daily. Added Januvia daily and stopped Metformin.  He is making slow and stead progress.  Lab Results  Component Value Date   HGBA1C 7.9 (H) 11/18/2017   CMP Latest Ref Rng & Units 11/18/2017 09/20/2017 02/18/2017  Glucose 65 - 99 mg/dL 189(H) 240(H) -  BUN 6 - 24 mg/dL 12 13 7   Creatinine 0.76 - 1.27 mg/dL 0.87 0.96 0.7  Sodium 134 - 144 mmol/L 141 138 -  Potassium 3.5 - 5.2 mmol/L 4.4 3.4(L) -  Chloride 96 - 106 mmol/L 103 102 -  CO2 20 - 29 mmol/L 23 26 -  Calcium 8.7 - 10.2 mg/dL 9.4 9.1 -  Total Protein 6.0 - 8.5 g/dL 6.5 - -  Total Bilirubin 0.0 - 1.2 mg/dL 0.4 - -  Alkaline Phos 39 - 117 IU/L 95 - -  AST 0 - 40 IU/L 30 - -  ALT 0 - 44 IU/L 51(H) - -   Lipid Panel     Component Value Date/Time   CHOL 137 11/18/2017 0823   TRIG 469 (H) 11/18/2017 0823   HDL 28 (L) 11/18/2017 0823   CHOLHDL 6.1 04/28/2014 0121   VLDL 61 (H) 04/28/2014 0121   LDLCALC Comment 11/18/2017 0823      Preferred Learning Style:  Auditory  Visual  Hands on  Learning Readiness:  Ready  Change in  progress   MEDICATIONS: See list   DIETARY INTAKE:   24-hr recall:  B ( AM): Egg on ww toast 2 slices,  Water Snk ( AM):  L ( PM):  Can't remember. Snk ( PM): D ( PM):  Chicken breast,  Water. Snk ( PM):  Beverages: water,   Usual physical activity: ADL  Estimated energy needs: 1800  calories 200  g carbohydrates 135 g protein 50 g fat  Progress Towards Goal(s):  In progress.   Nutritional Diagnosis:  NB-1.1 Food and nutrition-related knowledge deficit As related to Diabetes.  As evidenced by A1C 9.4%   Intervention:  Nutrition and Diabetes education provided on My Plate, CHO counting, meal planning, portion sizes, timing of meals, avoiding snacks between meals unless having a low blood sugar, target ranges for A1C and blood sugars, signs/symptoms and treatment of hyper/hypoglycemia, monitoring blood sugars, taking medications as prescribed, benefits of exercising 30 minutes per day and prevention of complications of DM.  Goals 1. Eat three meals per day. Do not skip meals 2.  Increase low carb vegetables-broccoli, greens, and low carb vegetables. 3. Keep drinking water and  avoid sodas 5. Keep blood sugars and taking insulin as prescribed. Physical activity: increase walking in the house.   Teaching Method Utilized: Visual Auditory Hands on  Handouts given during visit include:  The Plate Method   Meal Plan Card  Diabetes Instructions   Barriers to learning/adherence to lifestyle change: none  Demonstrated degree of understanding via:  Teach Back   Monitoring/Evaluation:  Dietary intake, exercise, meal planning , and body weight in 3 months. He will benefit from more aggressive treatment of depression. Has a history of being on Lexapro.

## 2017-11-23 NOTE — Progress Notes (Signed)
Subjective:    Patient ID: [name], male    DOB: 19-Apr-1961.  he is being seen in follow-up for management of currently uncontrolled symptomatic diabetes requested by  Sharilyn Sites, MD.   Past Medical History:  Diagnosis Date  . Abnormal myocardial perfusion study 01/01/2011   there a small to moderate sized inferobasal scar  . Barrett's esophagus   . CAD (coronary artery disease) 2008   bypass grafting   . Cataracts, bilateral   . Chronic kidney disease    hx of kidney stones  . Claudication ([place]) 11/16/2011   PV test perform shows normal  . COPD (chronic obstructive pulmonary disease) ([place])   . Depression   . Diabetes ([place])    type 2 diabetes mellitus  . Dysrhythmia   . GERD (gastroesophageal reflux disease)   . Glaucoma   . Hernia of abdominal wall   . HTN (hypertension)   . Hyperlipidemia   . Morbid obesity ([place])   . Myocardial infarction ([place]) 2008,2009,2009  . OSA (obstructive sleep apnea)    on cpap  . PAF (paroxysmal atrial fibrillation) ([place])   . S/P colonoscopy 2009   3-4 mm transverse colon erosions likely secondary to  ASA  . S/P endoscopy Dec 2011   moderate erosive gastritis, Barrett's esophagus 1-2cm  . Sleep apnea   . SOB (shortness of breath) 11/03/2007   2D Echo EF 50%-55%  . Stroke [facility])    Past Surgical History:  Procedure Laterality Date  . BIOPSY N/A 04/29/2015   Procedure: BIOPSY;  Surgeon: Danie Binder, MD;  Location: [facility] OR[facility];  Service: Endoscopy;  Laterality: N/A;  . CABG X 4  03/2008  . CARDIAC CATHETERIZATION  2009   stent placement to the left circumflex a 2.25   . CARDIAC CATHETERIZATION  07/08/2010  . COLONOSCOPY WITH PROPOFOL N/A 09/27/2017   Procedure: COLONOSCOPY WITH PROPOFOL;  Surgeon: Danie Binder, MD;  Location: [facility];  Service: Endoscopy;  Laterality: N/A;  12:00pm  . CORONARY ARTERY BYPASS GRAFT  2008   4 vessels  . CORONARY STENT PLACEMENT  12/29/12  . CORONARY STENT PLACEMENT  12/2012  .  ESOPHAGEAL DILATION N/A 04/29/2015   Procedure: ESOPHAGEAL DILATION 15 mm, 16 mm;  Surgeon: Danie Binder, MD;  Location: [facility];  Service: Endoscopy;  Laterality: N/A;  . ESOPHAGOGASTRODUODENOSCOPY  09/2011   Barrett's esophagus, no dysplasia on biopsies. Distal esophagitis. Status post dilation. Moderate gastritis and duodenitis, but biopsies benign. Next EGD in November 2015 for surveillance of Barrett's esophagus.  . ESOPHAGOGASTRODUODENOSCOPY (EGD) WITH PROPOFOL N/A 04/29/2015   SLF: 1. Barretts esophagus 2. Moderate non-erosive gastritis.   Marland Kitchen HERNIA REPAIR     ventral hernia repair  . LEFT HEART CATHETERIZATION WITH CORONARY/GRAFT ANGIOGRAM N/A 12/29/2012   Procedure: LEFT HEART CATHETERIZATION WITH Beatrix Fetters;  Surgeon: Troy Sine, MD;  Location: [facility] CATH LAB;  Service: Cardiovascular;  Laterality: N/A;  . ORIF FIBULA FRACTURE Right 06/03/2015   Procedure: OPEN REDUCTION INTERNAL FIXATION (ORIF) DISTAL FIBULA  FRACTURE;  Surgeon: Garald Balding, MD;  Location: [place];  Service: Orthopedics;  Laterality: Right;  . PERCUTANEOUS CORONARY STENT INTERVENTION (PCI-S)  12/29/2012   Procedure: PERCUTANEOUS CORONARY STENT INTERVENTION (PCI-S);  Surgeon: Troy Sine, MD;  Location: [facility] CATH LAB;  Service: Cardiovascular;;  . POLYPECTOMY  09/27/2017   Procedure: POLYPECTOMY;  Surgeon: Danie Binder, MD;  [facility];  Service: Endoscopy;;  cecal polyp, ascending polyps x6, transverse colon  polyps x6, splenic flexure polyps x2, descending colon polyps x6, sigmoid  colon polyp x1, rectal polyp x1   . SAVORY DILATION  09/06/2011   Procedure: SAVORY DILATION;  Surgeon: Dorothyann Peng, MD;  Location: [facility];  Service: Endoscopy;  Laterality: N/A;  Dilated with 50m   Social History   Socioeconomic History  . Marital status: Divorced    Spouse name: None  . Number of children: None  . Years of education: None  . Highest education level: None  Social Needs  . Financial  resource strain: None  . Food insecurity - worry: None  . Food insecurity - inability: None  . Transportation needs - medical: None  . Transportation needs - non-medical: None  Occupational History  . None  Tobacco Use  . Smoking status: Current Every Day Smoker    Packs/day: 0.50    Years: 30.00    Pack years: 15.00    Types: Cigarettes  . Smokeless tobacco: Never Used  Substance and Sexual Activity  . Alcohol use: Yes    Comment: occ  . Drug use: No  . Sexual activity: No  Other Topics Concern  . None  Social History Narrative  . None   Outpatient Encounter Medications as of 11/23/2017  Medication Sig  . amLODipine (NORVASC) 10 MG tablet Take 1 tablet (10 mg total) by mouth daily.  .Marland Kitchenapixaban (ELIQUIS) 5 MG TABS tablet Take 1 tablet (5 mg total) by mouth 2 (two) times daily.  . Blood Glucose Monitoring Suppl (ACCU-CHEK GUIDE) w/Device KIT 1 each by Does not apply route 4 (four) times daily.  . budesonide-formoterol (SYMBICORT) 160-4.5 MCG/ACT inhaler Inhale 1 puff daily into the lungs.   . Choline Fenofibrate (TRILIPIX) 135 MG capsule Take 135 mg by mouth at bedtime.   . diazepam (VALIUM) 5 MG tablet Take 5 mg by mouth 2 (two) times daily.  .Marland Kitchenescitalopram (LEXAPRO) 20 MG tablet Take 20 mg daily by mouth.   . Fluticasone-Umeclidin-Vilant (TRELEGY ELLIPTA) 100-62.5-25 MCG/INH AEPB Inhale 1 puff daily into the lungs.   . furosemide (LASIX) 80 MG tablet Take 80 mg by mouth daily.  .Marland Kitchenglucose blood (ACCU-CHEK GUIDE) test strip Use as instructed 4 x daily. E11.65  . Insulin Isophane & Regular Human (NOVOLIN 70/30 FLEXPEN RELION) (70-30) 100 UNIT/ML PEN Inject 40 units with breakfast and 30 units with supper when pre-meal blood glucose is above 90 mg/dL.  . Insulin Pen Needle (B-D ULTRAFINE III SHORT PEN) 31G X 8 MM MISC 1 each by Does not apply route as directed. (Patient not taking: Reported on 09/12/2017)  . Insulin Pen Needle (PEN NEEDLES) 32G X 6 MM MISC 1 each by Does not apply  route 4 (four) times daily.  . isosorbide mononitrate (IMDUR) 60 MG 24 hr tablet Take 1 tablet (60 mg total) by mouth 3 (three) times daily. Please make an appointment for additional refills (Patient taking differently: Take 60 mg by mouth 3 (three) times daily. )  . LANCETS ULTRA FINE MISC 1 each by Does not apply route 4 (four) times daily.  .Marland Kitchenlisinopril (PRINIVIL,ZESTRIL) 5 MG tablet Take 5 mg by mouth daily.  . metoprolol tartrate (LOPRESSOR) 100 MG tablet Take 1 tablet (100 mg total) by mouth 2 (two) times daily.  . modafinil (PROVIGIL) 100 MG tablet Take 100 mg daily by mouth.   . nitroGLYCERIN (NITROLINGUAL) 0.4 MG/SPRAY spray Place 1 spray under the tongue every 5 (five) minutes x 3 doses as needed for chest pain.  .Marland Kitchen  pantoprazole (PROTONIX) 40 MG tablet TAKE 1 TABLET BY MOUTH 30 MINUTES BEFORE BREAKFAST (Patient taking differently: TAKE 40 MG BY MOUTH 30 MINUTES BEFORE BREAKFAST)  . polyethylene glycol-electrolytes (TRILYTE) 420 g solution Take 4,000 mLs by mouth as directed.  . potassium chloride SA (K-DUR,KLOR-CON) 20 MEQ tablet Take 1 tablet (20 mEq total) by mouth 2 (two) times daily.  . ranitidine (ZANTAC) 150 MG capsule Take 150 mg at bedtime by mouth.   . rosuvastatin (CRESTOR) 20 MG tablet Take 20 mg by mouth daily.  . sitaGLIPtin (JANUVIA) 25 MG tablet Take 1 tablet (25 mg total) by mouth daily with breakfast.  . Vitamin D, Ergocalciferol, (DRISDOL) 50000 units CAPS capsule Take 1 capsule (50,000 Units total) by mouth every 7 (seven) days.  . [DISCONTINUED] glucose blood (ONETOUCH VERIO) test strip Use as instructed qid. e11.65  . [DISCONTINUED] Insulin Isophane & Regular Human (NOVOLIN 70/30 FLEXPEN RELION) (70-30) 100 UNIT/ML PEN Inject 30 Units into the skin 2 (two) times daily before a meal.  . [DISCONTINUED] sitaGLIPtin (JANUVIA) 100 MG tablet Take 25 mg by mouth daily with breakfast.   No facility-administered encounter medications on file as of 11/23/2017.      ALLERGIES: Allergies  Allergen Reactions  . Contrast Media [Iodinated Diagnostic Agents] Other (See Comments)    Pt must be premedicated before given contrast media - stops heart  . Iohexol Other (See Comments)     Consult with radiologist before pre meds are given.Desc: PT. STATES HEART STOPPED HAS TO BE PREMED.     VACCINATION STATUS:  There is no immunization history on file for this patient.  Diabetes  He presents for his follow-up diabetic visit. He has type 2 diabetes mellitus. Onset time: He was diagnosed at approximate age of 51 years. His disease course has been improving. There are no hypoglycemic associated symptoms. Pertinent negatives for hypoglycemia include no confusion, headaches, pallor or seizures. Associated symptoms include blurred vision, polydipsia and polyuria. Pertinent negatives for diabetes include no chest pain, no fatigue, no polyphagia and no weakness. There are no hypoglycemic complications. Symptoms are improving. Diabetic complications include a CVA and heart disease. Risk factors for coronary artery disease include diabetes mellitus, dyslipidemia, hypertension, male sex, obesity, tobacco exposure, sedentary lifestyle and family history. Current diabetic treatments: He was supposed to be on basal insulin, unfortunately he ran out and could not afford refills and he did not call clinic for alternatives. He is compliant with treatment none of the time. His weight is stable. He is following a generally unhealthy diet. When asked about meal planning, he reported none. He has not had a previous visit with a dietitian. He never participates in exercise. His breakfast blood glucose range is generally 180-200 mg/dl. His lunch blood glucose range is generally >200 mg/dl. His dinner blood glucose range is generally >200 mg/dl. His bedtime blood glucose range is generally >200 mg/dl. His overall blood glucose range is >200 mg/dl. (  ) An ACE inhibitor/angiotensin II  receptor blocker is being taken. He does not see a podiatrist.Eye exam is not current.  Hyperlipidemia  This is a chronic problem. The current episode started more than 1 year ago. The problem is uncontrolled. Recent lipid tests were reviewed and are high. Exacerbating diseases include diabetes and obesity. Pertinent negatives include no chest pain, myalgias or shortness of breath. Current antihyperlipidemic treatment includes statins. Compliance problems include medication cost and psychosocial issues.  Risk factors for coronary artery disease include dyslipidemia, diabetes mellitus, hypertension, male sex, obesity, family  history and a sedentary lifestyle.  Hypertension  This is a chronic problem. The current episode started more than 1 year ago. The problem is uncontrolled. Associated symptoms include blurred vision. Pertinent negatives include no chest pain, headaches, neck pain, palpitations or shortness of breath. Risk factors for coronary artery disease include diabetes mellitus, dyslipidemia, male gender, obesity, family history, sedentary lifestyle and smoking/tobacco exposure. Past treatments include ACE inhibitors. Hypertensive end-organ damage includes CVA.    Review of Systems  Constitutional: Negative for chills, fatigue, fever and unexpected weight change.  HENT: Negative for dental problem, mouth sores and trouble swallowing.   Eyes: Positive for blurred vision. Negative for visual disturbance.  Respiratory: Negative for cough, choking, chest tightness, shortness of breath and wheezing.   Cardiovascular: Negative for chest pain, palpitations and leg swelling.  Gastrointestinal: Negative for abdominal distention, abdominal pain, constipation, diarrhea, nausea and vomiting.  Endocrine: Positive for polydipsia and polyuria. Negative for polyphagia.  Genitourinary: Negative for dysuria, flank pain, hematuria and urgency.  Musculoskeletal: Negative for back pain, gait problem, myalgias and  neck pain.  Skin: Negative for pallor, rash and wound.  Neurological: Negative for seizures, syncope, weakness, numbness and headaches.  Psychiatric/Behavioral: Negative.  Negative for confusion and dysphoric mood.    Objective:    BP (!) 143/81   Pulse 75   Ht _0  (1.778 m)   Wt 298 lb (135.2 kg)   BMI 42.76 kg/m   Wt Readings from Last 3 Encounters:  11/23/17 298 lb (135.2 kg)  11/23/17 298 lb (135.2 kg)  11/17/17 300 lb (136.1 kg)     Physical Exam  Constitutional: He is oriented to person, place, and time. He appears well-developed. He is cooperative. No distress.  HENT:  Head: Normocephalic and atraumatic.  Eyes: EOM are normal.  Neck: Normal range of motion. Neck supple. No tracheal deviation present. No thyromegaly present.  Cardiovascular: Normal rate, S1 normal, S2 normal and normal heart sounds. Exam reveals no gallop.  No murmur heard. Pulses:      Dorsalis pedis pulses are 1+ on the right side, and 1+ on the left side.       Posterior tibial pulses are 1+ on the right side, and 1+ on the left side.  Pulmonary/Chest: Breath sounds normal. No respiratory distress. He has no wheezes.  Abdominal: Soft. Bowel sounds are normal. He exhibits no distension. There is no tenderness. There is no guarding and no CVA tenderness.  Musculoskeletal: He exhibits no edema.       Right shoulder: He exhibits no swelling and no deformity.  Neurological: He is alert and oriented to person, place, and time. He has normal strength and normal reflexes. No cranial nerve deficit or sensory deficit. Gait normal.  Skin: Skin is warm and dry. No rash noted. No cyanosis. Nails show no clubbing.  Psychiatric: His speech is normal. Cognition and memory are normal.  Reluctant affect. Unconcerned attitude.   CMP ( most recent) CMP     Component Value Date/Time   NA 141 11/18/2017 0823   K 4.4 11/18/2017 0823   CL 103 11/18/2017 0823   CO2 23 11/18/2017 0823   GLUCOSE 189 (H) 11/18/2017  0823   GLUCOSE 240 (H) 09/20/2017 1009   BUN 12 11/18/2017 0823   CREATININE 0.87 11/18/2017 0823   CALCIUM 9.4 11/18/2017 0823   PROT 6.5 11/18/2017 0823   ALBUMIN 4.1 11/18/2017 0823   AST 30 11/18/2017 0823   ALT 51 (H) 11/18/2017 0823   ALKPHOS 95  11/18/2017 0823   BILITOT 0.4 11/18/2017 0823   GFRNONAA 98 11/18/2017 0823   GFRAA 113 11/18/2017 0823   Diabetic Labs (most recent): Lab Results  Component Value Date   HGBA1C 7.9 (H) 11/18/2017   HGBA1C 9.4 05/23/2017   HGBA1C 10.6 02/18/2017     Lipid Panel ( most recent) Lipid Panel     Component Value Date/Time   CHOL 137 11/18/2017 0823   TRIG 469 (H) 11/18/2017 0823   HDL 28 (L) 11/18/2017 0823   CHOLHDL 6.1 04/28/2014 0121   VLDL 61 (H) 04/28/2014 0121   LDLCALC Comment 11/18/2017 0823     Assessment & Plan:   1. DM type 2 causing vascular disease ([place])  - Patient has currently uncontrolled symptomatic type 2 DM since  57 years of age. - He came with  improved glycemic profile on Novolin 70/30. His most recent labs show A1c of 7.9% slowly improving from 9.4%.    -his diabetes is complicated by coronary artery disease, CVA, obesity/sedentary life, chronic heavy smoking and MAOR MECKEL remains at a high risk for more acute and chronic complications which include CAD, CVA, CKD, retinopathy, and neuropathy. These are all discussed in detail with the patient.  - I have counseled him on diet management and weight loss, by adopting a carbohydrate restricted/protein rich diet.  -  Suggestion is made for him to avoid simple carbohydrates  from his diet including Cakes, Sweet Desserts / Pastries, Ice Cream, Soda (diet and regular), Sweet Tea, Candies, Chips, Cookies, Store Bought Juices, Alcohol in Excess of  1-2 drinks a day, Artificial Sweeteners, and "Sugar-free" Products. This will help patient to have stable blood glucose profile and potentially avoid unintended weight gain.   - I encouraged him to switch to   unprocessed or minimally processed complex starch and increased protein intake (animal or plant source), fruits, and vegetables.  - he is advised to stick to a routine mealtimes to eat 3 meals  a day and avoid unnecessary snacks ( to snack only to correct hypoglycemia).   - I have approached him with the following individualized plan to manage diabetes and patient agrees:   -  He is doing fairly well on premixed insulin.  - For simplicity and cost reasons, I decided to keep him on premixed insulin Novolin 70/30, increase in the breakfast dose to 35 units and keep the supper dose at 30 units for pre-meal readings above 90 mg/dL.   This patient will  need  Basal/bolus insulin  treatment to achieve control of his diabetes to target, however, unfortunately he is not affording insulin analogs, and he is too high risk to give basal/bolus insulin at this time.   - I I'll proceed with Januvia  25 mg by mouth daily with breakfast, due to risk of pancreatitis given severe hypertriglyceridemia.  - He reports intolerance to metformin causing GI side effects.  -Patient is encouraged to call clinic for blood glucose levels less than 70 or above 300 mg /dl.  - Patient specific target  A1c;  LDL, HDL, Triglycerides, and  Waist Circumference were discussed in detail.  2) BP/HTN:   his blood pressure is not controlled to target,  I advised him to continue his current blood pressure medications consistently including  ACEI/ARB. 3) Lipids/HPL: his lipid panel is not controlled  with severe hypertriglyceridemia   of  469.    Patient is advised to continue statins.  4)  Weight/Diet: CDE Consult has been  initiated ,  exercise, and detailed carbohydrates information provided.  5) Chronic Care/Health Maintenance:  -he  is on ACEI/ARB and Statin medications and  is encouraged to continue to follow up with Ophthalmology, Dentist,  Podiatrist at least yearly or according to recommendations, and advised to  quit  smoking. I have recommended yearly flu vaccine and pneumonia vaccination at least every 5 years; moderate intensity exercise for up to 150 minutes weekly; and  sleep for at least 7 hours a day.  - I advised patient to maintain close follow up with Sharilyn Sites, MD for primary care needs.  Follow up plan: - Return in about 3 months (around 02/21/2018) for meter, and logs.  - Time spent with the patient: 25 min, of which >50% was spent in reviewing his blood glucose logs , discussing his hypo- and hyper-glycemic episodes, reviewing his current and  previous labs and insulin doses and developing a plan to avoid hypo- and hyper-glycemia. Please refer to Patient Instructions for Blood Glucose Monitoring and Insulin/Medications Dosing Guide"  in media tab for additional information.  Glade Lloyd, MD Phone: [phone]  Fax: [phone]   11/23/2017, 3:26 PM   This note was partially dictated with voice recognition software. Similar sounding words can be transcribed inadequately or may not  be corrected upon review.

## 2017-11-27 ENCOUNTER — Emergency Department (HOSPITAL_COMMUNITY): Payer: Medicare HMO

## 2017-11-27 ENCOUNTER — Observation Stay (HOSPITAL_COMMUNITY)
Admission: EM | Admit: 2017-11-27 | Discharge: 2017-11-27 | Discharge status: Against Medical Advice | Payer: Medicare HMO | Attending: Family Medicine | Admitting: Family Medicine

## 2017-11-27 ENCOUNTER — Other Ambulatory Visit: Payer: Self-pay

## 2017-11-27 ENCOUNTER — Encounter (HOSPITAL_COMMUNITY): Payer: Self-pay

## 2017-11-27 DIAGNOSIS — I130 Hypertensive heart and chronic kidney disease with heart failure and stage 1 through stage 4 chronic kidney disease, or unspecified chronic kidney disease: Secondary | ICD-10-CM | POA: Diagnosis not present

## 2017-11-27 DIAGNOSIS — G4733 Obstructive sleep apnea (adult) (pediatric): Secondary | ICD-10-CM

## 2017-11-27 DIAGNOSIS — R9439 Abnormal result of other cardiovascular function study: Secondary | ICD-10-CM | POA: Diagnosis not present

## 2017-11-27 DIAGNOSIS — J441 Chronic obstructive pulmonary disease with (acute) exacerbation: Secondary | ICD-10-CM | POA: Diagnosis present

## 2017-11-27 DIAGNOSIS — Z6841 Body Mass Index (BMI) 40.0 and over, adult: Secondary | ICD-10-CM | POA: Insufficient documentation

## 2017-11-27 DIAGNOSIS — E1165 Type 2 diabetes mellitus with hyperglycemia: Secondary | ICD-10-CM | POA: Insufficient documentation

## 2017-11-27 DIAGNOSIS — E559 Vitamin D deficiency, unspecified: Secondary | ICD-10-CM

## 2017-11-27 DIAGNOSIS — K210 Gastro-esophageal reflux disease with esophagitis: Secondary | ICD-10-CM

## 2017-11-27 DIAGNOSIS — I2510 Atherosclerotic heart disease of native coronary artery without angina pectoris: Secondary | ICD-10-CM

## 2017-11-27 DIAGNOSIS — I480 Paroxysmal atrial fibrillation: Secondary | ICD-10-CM | POA: Diagnosis not present

## 2017-11-27 DIAGNOSIS — F17210 Nicotine dependence, cigarettes, uncomplicated: Secondary | ICD-10-CM | POA: Insufficient documentation

## 2017-11-27 DIAGNOSIS — I252 Old myocardial infarction: Secondary | ICD-10-CM | POA: Insufficient documentation

## 2017-11-27 DIAGNOSIS — Z79899 Other long term (current) drug therapy: Secondary | ICD-10-CM | POA: Diagnosis not present

## 2017-11-27 DIAGNOSIS — R0789 Other chest pain: Secondary | ICD-10-CM | POA: Diagnosis not present

## 2017-11-27 DIAGNOSIS — F329 Major depressive disorder, single episode, unspecified: Secondary | ICD-10-CM | POA: Insufficient documentation

## 2017-11-27 DIAGNOSIS — F17200 Nicotine dependence, unspecified, uncomplicated: Secondary | ICD-10-CM | POA: Diagnosis not present

## 2017-11-27 DIAGNOSIS — J449 Chronic obstructive pulmonary disease, unspecified: Secondary | ICD-10-CM

## 2017-11-27 DIAGNOSIS — K219 Gastro-esophageal reflux disease without esophagitis: Secondary | ICD-10-CM | POA: Diagnosis present

## 2017-11-27 DIAGNOSIS — E785 Hyperlipidemia, unspecified: Secondary | ICD-10-CM

## 2017-11-27 DIAGNOSIS — H409 Unspecified glaucoma: Secondary | ICD-10-CM | POA: Diagnosis present

## 2017-11-27 DIAGNOSIS — R0602 Shortness of breath: Secondary | ICD-10-CM

## 2017-11-27 DIAGNOSIS — R197 Diarrhea, unspecified: Secondary | ICD-10-CM | POA: Diagnosis not present

## 2017-11-27 DIAGNOSIS — Z955 Presence of coronary angioplasty implant and graft: Secondary | ICD-10-CM | POA: Insufficient documentation

## 2017-11-27 DIAGNOSIS — I10 Essential (primary) hypertension: Secondary | ICD-10-CM | POA: Diagnosis present

## 2017-11-27 DIAGNOSIS — I5032 Chronic diastolic (congestive) heart failure: Secondary | ICD-10-CM | POA: Diagnosis not present

## 2017-11-27 DIAGNOSIS — N189 Chronic kidney disease, unspecified: Secondary | ICD-10-CM | POA: Diagnosis not present

## 2017-11-27 DIAGNOSIS — E1122 Type 2 diabetes mellitus with diabetic chronic kidney disease: Secondary | ICD-10-CM | POA: Insufficient documentation

## 2017-11-27 DIAGNOSIS — Z87442 Personal history of urinary calculi: Secondary | ICD-10-CM | POA: Insufficient documentation

## 2017-11-27 DIAGNOSIS — E1159 Type 2 diabetes mellitus with other circulatory complications: Secondary | ICD-10-CM

## 2017-11-27 DIAGNOSIS — Z794 Long term (current) use of insulin: Secondary | ICD-10-CM | POA: Diagnosis not present

## 2017-11-27 DIAGNOSIS — Z8673 Personal history of transient ischemic attack (TIA), and cerebral infarction without residual deficits: Secondary | ICD-10-CM | POA: Diagnosis not present

## 2017-11-27 DIAGNOSIS — Z951 Presence of aortocoronary bypass graft: Secondary | ICD-10-CM | POA: Diagnosis not present

## 2017-11-27 DIAGNOSIS — E1136 Type 2 diabetes mellitus with diabetic cataract: Secondary | ICD-10-CM | POA: Insufficient documentation

## 2017-11-27 DIAGNOSIS — R0609 Other forms of dyspnea: Secondary | ICD-10-CM | POA: Diagnosis present

## 2017-11-27 DIAGNOSIS — I639 Cerebral infarction, unspecified: Secondary | ICD-10-CM | POA: Diagnosis present

## 2017-11-27 DIAGNOSIS — E782 Mixed hyperlipidemia: Secondary | ICD-10-CM | POA: Diagnosis present

## 2017-11-27 DIAGNOSIS — I5030 Unspecified diastolic (congestive) heart failure: Secondary | ICD-10-CM | POA: Diagnosis present

## 2017-11-27 DIAGNOSIS — R079 Chest pain, unspecified: Secondary | ICD-10-CM

## 2017-11-27 LAB — CBC
HCT: 50.6 % (ref 39.0–52.0)
Hemoglobin: 17.8 g/dL — ABNORMAL HIGH (ref 13.0–17.0)
MCH: 32.1 pg (ref 26.0–34.0)
MCHC: 35.2 g/dL (ref 30.0–36.0)
MCV: 91.3 fL (ref 78.0–100.0)
PLATELETS: 242 10*3/uL (ref 150–400)
RBC: 5.54 MIL/uL (ref 4.22–5.81)
RDW: 12.4 % (ref 11.5–15.5)
WBC: 10.5 10*3/uL (ref 4.0–10.5)

## 2017-11-27 LAB — BASIC METABOLIC PANEL
Anion gap: 14 (ref 5–15)
BUN: 9 mg/dL (ref 6–20)
CO2: 23 mmol/L (ref 22–32)
CREATININE: 1.03 mg/dL (ref 0.61–1.24)
Calcium: 9.5 mg/dL (ref 8.9–10.3)
Chloride: 100 mmol/L — ABNORMAL LOW (ref 101–111)
GFR calc Af Amer: >60 mL/min (ref >60)
Glucose, Bld: 133 mg/dL — ABNORMAL HIGH (ref 65–99)
Potassium: 3.5 mmol/L (ref 3.5–5.1)
SODIUM: 137 mmol/L (ref 135–145)

## 2017-11-27 LAB — HEPATIC FUNCTION PANEL
ALK PHOS: 80 U/L (ref 38–126)
ALT: 86 U/L — ABNORMAL HIGH (ref 17–63)
AST: 74 U/L — ABNORMAL HIGH (ref 15–41)
Albumin: 3.9 g/dL (ref 3.5–5.0)
BILIRUBIN INDIRECT: 0.4 mg/dL (ref 0.3–0.9)
Bilirubin, Direct: 0.2 mg/dL (ref 0.1–0.5)
TOTAL PROTEIN: 7.7 g/dL (ref 6.5–8.1)
Total Bilirubin: 0.6 mg/dL (ref 0.3–1.2)

## 2017-11-27 LAB — TROPONIN I
Troponin I: <0.03 ng/mL (ref <0.03)
Troponin I: <0.03 ng/mL (ref <0.03)

## 2017-11-27 LAB — LIPASE, BLOOD: LIPASE: 20 U/L (ref 11–51)

## 2017-11-27 LAB — BRAIN NATRIURETIC PEPTIDE: B NATRIURETIC PEPTIDE 5: 18 pg/mL (ref 0.0–100.0)

## 2017-11-27 MED ORDER — INSULIN ASPART 100 UNIT/ML ~~LOC~~ SOLN: 0.0000 [IU] | Freq: Every day | SUBCUTANEOUS | Status: DC

## 2017-11-27 MED ORDER — ONDANSETRON HCL 4 MG/2ML IJ SOLN
4.0000 mg | Freq: Once | INTRAMUSCULAR | Status: AC
  Administered 2017-11-27: 4 mg via INTRAVENOUS
  Filled 2017-11-27: qty 2

## 2017-11-27 MED ORDER — FAMOTIDINE 20 MG PO TABS: 20.0000 mg | ORAL_TABLET | Freq: Every day | ORAL | Status: DC

## 2017-11-27 MED ORDER — INSULIN ASPART 100 UNIT/ML ~~LOC~~ SOLN: 8.0000 [IU] | Freq: Three times a day (TID) | SUBCUTANEOUS | Status: DC

## 2017-11-27 MED ORDER — GI COCKTAIL ~~LOC~~: 30.0000 mL | Freq: Four times a day (QID) | ORAL | Status: DC | PRN

## 2017-11-27 MED ORDER — ASPIRIN 81 MG PO CHEW
324.0000 mg | CHEWABLE_TABLET | Freq: Once | ORAL | Status: AC
Start: 2017-11-27 — End: 2017-11-27
  Administered 2017-11-27: 324 mg via ORAL
  Filled 2017-11-27: qty 4

## 2017-11-27 MED ORDER — POTASSIUM CHLORIDE CRYS ER 20 MEQ PO TBCR: 20.0000 meq | EXTENDED_RELEASE_TABLET | Freq: Two times a day (BID) | ORAL | Status: DC

## 2017-11-27 MED ORDER — MORPHINE SULFATE (PF) 2 MG/ML IV SOLN: 2.0000 mg | INTRAVENOUS | Status: DC | PRN

## 2017-11-27 MED ORDER — IPRATROPIUM-ALBUTEROL 0.5-2.5 (3) MG/3ML IN SOLN
3.0000 mL | Freq: Once | RESPIRATORY_TRACT | Status: AC
  Administered 2017-11-27: 3 mL via RESPIRATORY_TRACT
  Filled 2017-11-27: qty 3

## 2017-11-27 MED ORDER — FENTANYL CITRATE (PF) 100 MCG/2ML IJ SOLN
50.0000 ug | Freq: Once | INTRAMUSCULAR | Status: AC
  Administered 2017-11-27: 50 ug via INTRAVENOUS
  Filled 2017-11-27: qty 2

## 2017-11-27 MED ORDER — VITAMIN D (ERGOCALCIFEROL) 1.25 MG (50000 UNIT) PO CAPS: 50000.0000 [IU] | ORAL_CAPSULE | ORAL | Status: DC

## 2017-11-27 MED ORDER — ACETAMINOPHEN 325 MG PO TABS: 650.0000 mg | ORAL_TABLET | ORAL | Status: DC | PRN

## 2017-11-27 MED ORDER — ONDANSETRON HCL 4 MG/2ML IJ SOLN: 4.0000 mg | Freq: Four times a day (QID) | INTRAMUSCULAR | Status: DC | PRN

## 2017-11-27 MED ORDER — FENOFIBRATE 54 MG PO TABS: 54.0000 mg | ORAL_TABLET | Freq: Every day | ORAL | Status: DC

## 2017-11-27 MED ORDER — LISINOPRIL 5 MG PO TABS: 5.0000 mg | ORAL_TABLET | Freq: Every day | ORAL | Status: DC

## 2017-11-27 MED ORDER — SODIUM CHLORIDE 0.9 % IV BOLUS (SEPSIS)
1000.0000 mL | Freq: Once | INTRAVENOUS | Status: AC
  Administered 2017-11-27: 1000 mL via INTRAVENOUS

## 2017-11-27 MED ORDER — FUROSEMIDE 40 MG PO TABS: 80.0000 mg | ORAL_TABLET | Freq: Every day | ORAL | Status: DC

## 2017-11-27 MED ORDER — MORPHINE SULFATE (PF) 2 MG/ML IV SOLN: 1.0000 mg | INTRAVENOUS | Status: DC | PRN

## 2017-11-27 MED ORDER — APIXABAN 5 MG PO TABS: 5.0000 mg | ORAL_TABLET | Freq: Two times a day (BID) | ORAL | Status: DC

## 2017-11-27 MED ORDER — PANTOPRAZOLE SODIUM 40 MG PO TBEC: 40.0000 mg | DELAYED_RELEASE_TABLET | Freq: Every day | ORAL | Status: DC

## 2017-11-27 MED ORDER — METOPROLOL TARTRATE 50 MG PO TABS: 100.0000 mg | ORAL_TABLET | Freq: Two times a day (BID) | ORAL | Status: DC

## 2017-11-27 MED ORDER — NICOTINE 14 MG/24HR TD PT24: 14.0000 mg | MEDICATED_PATCH | Freq: Every day | TRANSDERMAL | Status: DC | PRN

## 2017-11-27 MED ORDER — ISOSORBIDE MONONITRATE ER 60 MG PO TB24: 60.0000 mg | ORAL_TABLET | Freq: Two times a day (BID) | ORAL | Status: DC

## 2017-11-27 MED ORDER — FLUTICASONE-UMECLIDIN-VILANT 100-62.5-25 MCG/INH IN AEPB: 1.0000 | INHALATION_SPRAY | Freq: Every day | RESPIRATORY_TRACT | Status: DC

## 2017-11-27 MED ORDER — ASPIRIN EC 81 MG PO TBEC: 81.0000 mg | DELAYED_RELEASE_TABLET | Freq: Every day | ORAL | Status: DC

## 2017-11-27 MED ORDER — IPRATROPIUM-ALBUTEROL 0.5-2.5 (3) MG/3ML IN SOLN: 3.0000 mL | Freq: Three times a day (TID) | RESPIRATORY_TRACT | Status: DC

## 2017-11-27 MED ORDER — MECLIZINE HCL 12.5 MG PO TABS: 25.0000 mg | ORAL_TABLET | Freq: Two times a day (BID) | ORAL | Status: DC | PRN

## 2017-11-27 MED ORDER — AMLODIPINE BESYLATE 5 MG PO TABS: 10.0000 mg | ORAL_TABLET | Freq: Every day | ORAL | Status: DC

## 2017-11-27 MED ORDER — DIAZEPAM 5 MG PO TABS: 5.0000 mg | ORAL_TABLET | Freq: Two times a day (BID) | ORAL | Status: DC | PRN

## 2017-11-27 MED ORDER — INSULIN GLARGINE 100 UNIT/ML ~~LOC~~ SOLN: 30.0000 [IU] | Freq: Every day | SUBCUTANEOUS | Status: DC

## 2017-11-27 MED ORDER — INSULIN ASPART 100 UNIT/ML ~~LOC~~ SOLN: 0.0000 [IU] | Freq: Three times a day (TID) | SUBCUTANEOUS | Status: DC

## 2017-11-27 MED ORDER — NITROGLYCERIN 0.4 MG/SPRAY TL SOLN: 1.0000 | Status: DC | PRN

## 2017-11-27 MED ORDER — NITROGLYCERIN 0.4 MG SL SUBL: 0.4000 mg | SUBLINGUAL_TABLET | SUBLINGUAL | Status: DC | PRN

## 2017-11-27 MED ORDER — ESCITALOPRAM OXALATE 20 MG PO TABS: 20.0000 mg | ORAL_TABLET | Freq: Every day | ORAL | Status: DC

## 2017-11-27 MED ORDER — GI COCKTAIL ~~LOC~~: 30.0000 mL | Freq: Once | ORAL | Status: DC

## 2017-11-27 NOTE — ED Triage Notes (Signed)
Reports of left sided abdominal pain with vomiting and diarrhea that started Thursday. Also reports of chest pain with shortness of breath that started Friday night while getting out of bed. Has taken nitro Saturday with no relief.

## 2017-11-27 NOTE — H&P (Signed)
History and Physical  [name] BZJ:[id number] DOB: January 05, 1961 DOA: 11/27/2017  Referring physician: Dolly Rias MD PCP: Sharilyn Sites, MD   Chief Complaint: chest pain  HPI: [name] is a 57 y.o. male is a 57 y.o. male with multiple past medical problems as diagnosed below presents emergency department with multiple complaints.  Solik the patient has some upper GI illness started on Thursday starting with nonbloody nonbilious vomiting and then having some diarrhea that is also nonbloody with it.  This persisted and on Friday started having some intermittent chest pain he describes as sharp pressure-like radiates to his left arm associated with some shortness of breath and diaphoresis.  He states it feels like previous episodes of heart attack and does have a history of a CABG.  He tried nitroglycerin which did not seem to help with the pain.  He had decreased p.o. intake during this time but afebrile.  No significant abdominal pain but does have some left-sided mid thoracic pain.  No urinary symptoms.  No recent illnesses.  No rashes.  ED Course: Patient's initial troponin was less than 0.03.  The patient had a chest x-ray that did not reveal any acute findings.  EKG revealed normal sinus rhythm with some Q waves seen but no acute changes from previous studies.  Lipase was normal at 20.  Liver enzymes mildly elevated with an ALT of 86 and an AST of 74.  This is not significantly changed from previous studies.  The patient experienced relief in symptoms after a nebulizer treatment given in the ED.  Review of Systems: All systems reviewed and apart from history of presenting illness, are negative.  Past Medical History:  Diagnosis Date  . Abnormal myocardial perfusion study 01/01/2011   there a small to moderate sized inferobasal scar  . Barrett's esophagus   . Cataracts, bilateral   . Chronic kidney disease    hx of kidney stones  . Claudication ([place]) 11/16/2011   PV test  perform shows normal  . COPD (chronic obstructive pulmonary disease) ([place])   . Depression   . Diabetes ([place])    type 2 diabetes mellitus  . Dysrhythmia   . GERD (gastroesophageal reflux disease)   . Glaucoma   . Hernia of abdominal wall   . HTN (hypertension)   . Hyperlipidemia   . Morbid obesity ([place])   . Myocardial infarction ([place]) 2008,2009,2009  . OSA (obstructive sleep apnea)    on cpap  . PAF (paroxysmal atrial fibrillation) ([place])   . S/P colonoscopy 2009   3-4 mm transverse colon erosions likely secondary to  ASA  . S/P endoscopy Dec 2011   moderate erosive gastritis, Barrett's esophagus 1-2cm  . Sleep apnea   . SOB (shortness of breath) 11/03/2007   2D Echo EF 50%-55%  . Stroke [facility])    Past Surgical History:  Procedure Laterality Date  . BIOPSY N/A 04/29/2015   Procedure: BIOPSY;  Surgeon: Danie Binder, MD;  Location: [facility] ORS;  Service: Endoscopy;  Laterality: N/A;  . CABG X 4  03/2008  . CARDIAC CATHETERIZATION  2009   stent placement to the left circumflex a 2.25   . CARDIAC CATHETERIZATION  07/08/2010  . COLONOSCOPY WITH PROPOFOL N/A 09/27/2017   Procedure: COLONOSCOPY WITH PROPOFOL;  Surgeon: Danie Binder, MD;  Location: [facility] ENDO SUITE;  Service: Endoscopy;  Laterality: N/A;  12:00pm  . CORONARY ARTERY BYPASS GRAFT  2008   4 vessels  . CORONARY STENT PLACEMENT  12/29/12  .  CORONARY STENT PLACEMENT  12/2012  . ESOPHAGEAL DILATION N/A 04/29/2015   Procedure: ESOPHAGEAL DILATION 15 mm, 16 mm;  Surgeon: Danie Binder, MD;  Location: [facility] ORS;  Service: Endoscopy;  Laterality: N/A;  . ESOPHAGOGASTRODUODENOSCOPY  09/2011   Barrett's esophagus, no dysplasia on biopsies. Distal esophagitis. Status post dilation. Moderate gastritis and duodenitis, but biopsies benign. Next EGD in November 2015 for surveillance of Barrett's esophagus.  . ESOPHAGOGASTRODUODENOSCOPY (EGD) WITH PROPOFOL N/A 04/29/2015   SLF: 1. Barretts esophagus 2. Moderate non-erosive gastritis.   Marland Kitchen HERNIA  REPAIR     ventral hernia repair  . LEFT HEART CATHETERIZATION WITH CORONARY/GRAFT ANGIOGRAM N/A 12/29/2012   Procedure: LEFT HEART CATHETERIZATION WITH Beatrix Fetters;  Surgeon: Troy Sine, MD;  Location[facility] C[facility] LAB;  Service: Cardiovascular;  Laterality: N/A;  . ORIF FIBULA FRACTURE Right 06/03/2015   Procedure: OPEN REDUCTION INTERNAL FIXATION (ORIF) DISTAL FIBULA  FRACTURE;  Surgeon: Garald Balding, MD;  Location: [place];  Service: Orthopedics;  Laterality: Right;  . PERCUTANEOUS CORONARY STENT INTERVENTION (PCI-S)  12/29/2012   Procedure: PERCUTANEOUS CORONARY STENT INTERVENTION (PCI-S);  Surgeon: Troy Sine, MD;  Location: [facility] CATH LAB;  Service: Cardiovascular;;  . POLYPECTOMY  09/27/2017   Procedure: POLYPECTOMY;  Surgeon: Danie Binder, MD;  Location[facility];  Service: Endoscopy;;  cecal polyp, ascending polyps x6, transverse colon polyps x6, splenic flexure polyps x2, descending colon polyps x6, sigmoid  colon polyp x1, rectal polyp x1   . SAVORY DILATION  09/06/2011   Procedure: SAVORY DILATION;  Surgeon: Dorothyann Peng, MD;  Location: [facility] OR[facility];  Service: Endoscopy;  Laterality: N/A;  Dilated with 52m   Social History:  reports that he has been smoking cigarettes.  He has a 15.00 pack-year smoking history. he has never used smokeless tobacco. He reports that he drinks alcohol. He reports that he does not use drugs.  Allergies  Allergen Reactions  . Contrast Media [Iodinated Diagnostic Agents] Other (See Comments)    Pt must be premedicated before given contrast media - stops heart  . Iohexol Other (See Comments)     Consult with radiologist before pre meds are given.Desc: PT. STATES HEART STOPPED HAS TO BE PREMED.     Family History  Problem Relation Age of Onset  . Diabetes Mother   . Heart disease Mother   . Diabetes Father   . Heart disease Father   . Pancreatic cancer Brother        age 951 doing well  . Colon cancer Neg Hx   . Anesthesia  problems Neg Hx   . Hypotension Neg Hx   . Malignant hyperthermia Neg Hx   . Pseudochol deficiency Neg Hx     Prior to Admission medications   Medication Sig Start Date End Date Taking? Authorizing Provider  apixaban (ELIQUIS) 5 MG TABS tablet Take 1 tablet (5 mg total) by mouth 2 (two) times daily. 06/04/15  Yes Petrarca, BMike Craze PA-C  Choline Fenofibrate (TRILIPIX) 135 MG capsule Take 135 mg by mouth at bedtime.    Yes [provider]  diazepam (VALIUM) 5 MG tablet Take 5 mg by mouth 2 (two) times daily.   Yes [provider]  escitalopram (LEXAPRO) 20 MG tablet Take 20 mg daily by mouth.  04/27/14  Yes [provider]  Fluticasone-Umeclidin-Vilant (TRELEGY ELLIPTA) 100-62.5-25 MCG/INH AEPB Inhale 1 puff daily into the lungs.    Yes [provider]  furosemide (LASIX) 80 MG tablet Take 80 mg  by mouth daily. 04/22/15  Yes [provider]  Insulin Isophane & Regular Human (NOVOLIN 70/30 FLEXPEN RELION) (70-30) 100 UNIT/ML PEN Inject 40 units with breakfast and 30 units with supper when pre-meal blood glucose is above 90 mg/dL. 11/23/17  Yes Cassandria Anger, MD  isosorbide mononitrate (IMDUR) 60 MG 24 hr tablet Take 1 tablet (60 mg total) by mouth 3 (three) times daily. Please make an appointment for additional refills Patient taking differently: Take 60 mg by mouth 2 (two) times daily.  03/21/15  Yes Croitoru, Mihai, MD  lisinopril (PRINIVIL,ZESTRIL) 5 MG tablet Take 5 mg by mouth daily.   Yes [provider]  meclizine (ANTIVERT) 25 MG tablet Take 25 mg by mouth 2 (two) times daily as needed for dizziness.   Yes [provider]  metoprolol tartrate (LOPRESSOR) 100 MG tablet Take 1 tablet (100 mg total) by mouth 2 (two) times daily. 03/17/17  Yes BranchAlphonse Guild, MD  modafinil (PROVIGIL) 100 MG tablet Take 100 mg daily by mouth.    Yes [provider]  nitroGLYCERIN (NITROLINGUAL) 0.4 MG/SPRAY spray Place 1 spray under  the tongue every 5 (five) minutes x 3 doses as needed for chest pain.   Yes [provider]  pantoprazole (PROTONIX) 40 MG tablet TAKE 1 TABLET BY MOUTH 30 MINUTES BEFORE BREAKFAST Patient taking differently: TAKE 40 MG BY MOUTH 30 MINUTES BEFORE BREAKFAST 08/19/16  Yes Annitta Needs, NP  potassium chloride SA (K-DUR,KLOR-CON) 20 MEQ tablet Take 1 tablet (20 mEq total) by mouth 2 (two) times daily. 07/22/14  Yes Croitoru, Mihai, MD  ranitidine (ZANTAC) 150 MG capsule Take 150 mg at bedtime by mouth.    Yes [provider]  sitaGLIPtin (JANUVIA) 25 MG tablet Take 1 tablet (25 mg total) by mouth daily with breakfast. Patient taking differently: Take 50 mg by mouth daily with breakfast.  11/23/17  Yes Nida, Marella Chimes, MD  Vitamin D, Ergocalciferol, (DRISDOL) 50000 units CAPS capsule Take 1 capsule (50,000 Units total) by mouth every 7 (seven) days. 11/21/17  Yes Cassandria Anger, MD  amLODipine (NORVASC) 10 MG tablet Take 1 tablet (10 mg total) by mouth daily. 01/11/17 09/12/17  Arnoldo Lenis, MD  Blood Glucose Monitoring Suppl (ACCU-CHEK GUIDE) w/Device KIT 1 each by Does not apply route 4 (four) times daily. 11/21/17   Cassandria Anger, MD  glucose blood (ACCU-CHEK GUIDE) test strip Use as instructed 4 x daily. E11.65 11/21/17   Cassandria Anger, MD  Insulin Pen Needle (B-D ULTRAFINE III SHORT PEN) 31G X 8 MM MISC 1 each by Does not apply route as directed. Patient not taking: Reported on 09/12/2017 07/18/17   Cassandria Anger, MD  Insulin Pen Needle (PEN NEEDLES) 32G X 6 MM MISC 1 each by Does not apply route 4 (four) times daily. 11/21/17   Cassandria Anger, MD  LANCETS ULTRA FINE MISC 1 each by Does not apply route 4 (four) times daily. 11/21/17   Cassandria Anger, MD   Physical Exam: Vitals:   11/27/17 1501 11/27/17 1502 11/27/17 1609  BP: 93/65    Pulse: 96    Resp: (!) 25    Temp: 98.1 F (36.7 C)    TempSrc: Oral    SpO2: 96%  93%    Weight:  135.2 kg (298 lb)   Height:  _0  (1.778 m)      General exam: Moderately built and obese patient, lying comfortably supine on the gurney in no  obvious distress.  He has the appearance of having syndrome X.  Head, eyes and ENT: Nontraumatic and normocephalic. Pupils equally reacting to light and accommodation. Oral mucosa moist.  Neck: Supple. No JVD, carotid bruit or thyromegaly.  Lymphatics: No lymphadenopathy.  Respiratory system: Clear to auscultation. No increased work of breathing.  Cardiovascular system: S1 and S2 heard.  No JVD, murmurs, gallops, clicks or pedal edema.  Gastrointestinal system: Abdomen is nondistended, soft and nontender. Normal bowel sounds heard. No organomegaly or masses appreciated.  Central nervous system: Alert and oriented. No focal neurological deficits.  Extremities: Symmetric 5 x 5 power. Peripheral pulses symmetrically felt.   Skin: No rashes or acute findings.  Musculoskeletal system: Negative exam.  Psychiatry: Pleasant and cooperative.  Labs on Admission:  Basic Metabolic Panel: Recent Labs  Lab 11/27/17 1511  NA 137  K 3.5  CL 100*  CO2 23  GLUCOSE 133*  BUN 9  CREATININE 1.03  CALCIUM 9.5   Liver Function Tests: Recent Labs  Lab 11/27/17 1511  AST 74*  ALT 86*  ALKPHOS 80  BILITOT 0.6  PROT 7.7  ALBUMIN 3.9   No results for input(s): LIPASE, AMYLASE in the last 168 hours. No results for input(s): AMMONIA in the last 168 hours. CBC: Recent Labs  Lab 11/27/17 1511  WBC 10.5  HGB 17.8*  HCT 50.6  MCV 91.3  PLT 242   Cardiac Enzymes: Recent Labs  Lab 11/27/17 1511  TROPONINI <0.03    BNP (last 3 results) No results for input(s): PROBNP in the last 8760 hours. CBG: No results for input(s): GLUCAP in the last 168 hours.  Radiological Exams on Admission: Dg Chest Port 1 View  Result Date: 11/27/2017 CLINICAL DATA:  Chest pain, LEFT side abdominal pain, vomiting and diarrhea since Thursday,  shortness of breath with chest pain since Friday, history diabetes mellitus, hypertension, COPD, stroke, chronic kidney disease, atrial fibrillation EXAM: PORTABLE CHEST 1 VIEW COMPARISON:  Portable exam 1517 hours compared to 02/14/2017 FINDINGS: Normal heart size post median sternotomy and CABG. Mediastinal contours and pulmonary vascularity normal. No acute infiltrate, pleural effusion or pneumothorax. Bones unremarkable. IMPRESSION: No acute abnormalities. Electronically Signed   By: Lavonia Dana M.D.   On: 11/27/2017 15:54   EKG: Independently reviewed. NSR, no acute findings.   Assessment/Plan Principal Problem:   Chest pain at rest Active Problems:   Hyperlipidemia   OSA (obstructive sleep apnea)   CAD (coronary artery disease)   PAF (paroxysmal atrial fibrillation) (HCC)   GERD (gastroesophageal reflux disease)   COPD (chronic obstructive pulmonary disease) (HCC)   Abnormal myocardial perfusion study   Chronic kidney disease   Type 2 diabetes mellitus with vascular disease (HCC)   HTN (hypertension)   H/O Stroke   Glaucoma   SOB (shortness of breath)   Current every day smoker   Severe Vitamin D deficiency   Diastolic congestive heart failure, NYHA class 2 ([place])  1. Chest pain symptoms at rest-with the patient's history of coronary artery disease will be admitting him for observation to monitor on continuous telemetry and to cycle troponin to rule out acute myocardial ischemia.  Resuming all of his home cardiac medications.  I suspect most of his symptoms are exacerbated by COPD as his symptoms did improve some after a nebulizer treatment.  Please see orders. 2. Coronary artery disease with reported history of CABG-according to his most recent outpatient cardiology visit, he is being treated medically at this time with antianginal medications, Eliquis, isosorbide, metoprolol,  statin and ACE.  Workup as noted above, resuming all of his home cardiac medications including Imdur,  metoprolol and aspirin.  3. Type 2 diabetes mellitus, uncontrolled with vascular complications-he has been started on a sliding scale insulin with Lantus and prandial NovoLog insulin with meals.  Holding his home 70/30 insulin for now given that he is not eating regularly and is having some GI symptoms.  He was recently seen by his endocrinologist. 4. Essential Hypertension -blood pressure remains well controlled on current medications will follow. 5. Viral gastroenteritis-symptoms seem to be resolving.  The patient reports that diarrhea is resolving and is ready to start eating again. 6. OSA-we will offer CPAP at night.  7. Tobacco dependence -patient strongly advised to stop using tobacco products completely.  He verbalized understanding.  He will be provided with a nicotine patch to use as needed for cravings symptoms. 8. -COPD-the patient will be resumed on his home respiratory medications in addition to scheduled duo nebs which seem to be helping his symptoms at this time.  His chest x-ray is clear.  Supplemental oxygen is ordered as well. 9. Essential hypertension-resuming all of his home blood pressure medications. 10. GERD-Protonix ordered for GI protection. 11. Diabetic dyslipidemia-resume home medications and working on glycemic control to improve lipids.  He is following up with his endocrinologist to help with this. 12. Paroxysmal atrial fibrillation-continue anticoagulation with apixaban and continue with metoprolol for rate control. 13. Class II diastolic congestive heart failure-resume home Lasix and potassium appears compensated at this time and not in exacerbation. 14. Severe vitamin D deficiency-she has been started on high-dose vitamin D supplementation which we will continue in the hospital.  He is due for a dose of drisdol 50,000 IU on 11/28/17.   DVT Prophylaxis: Apixaban Code Status: Full Family Communication: No family at bedside Disposition Plan: Home tomorrow if  stable  Time spent: 61-minutes  Irwin Brakeman, MD [facility] Pager [phone]  If 7PM-7AM, please contact night-coverage [email or web address] Password [email or web address] 11/27/2017, 5:25 PM

## 2017-11-27 NOTE — Discharge Summary (Signed)
Physician Discharge Summary  [name] NOM:[id number] DOB: [birth year] DOA: 11/27/2017  PCP: Sharilyn Sites, MD  Admit date: 11/27/2017 Discharge date: 11/27/2017  Patient discharged from ED [place]  Discharge Condition: Guarded   CODE STATUS: FULL    Brief Hospitalization Summary: Please see all hospital notes, images, labs for full details of the hospitalization. HPI: [name] is a 57 y.o. male is a 58 y.o.malewith multiple past medical problems as diagnosed below presents emergency department with multiple complaints. Solik the patient has some upper GI illness started on Thursday starting with nonbloody nonbilious vomiting and then having some diarrhea that is also nonbloody with it. This persisted and on Friday started having some intermittent chest pain he describes as sharp pressure-like radiates to his left arm associated with some shortness of breath and diaphoresis. He states it feels like previous episodes of heart attack and does have a history of a CABG. He tried nitroglycerin which did not seem to help with the pain. He had decreased p.o. intake during this time but afebrile. No significant abdominal pain but does have some left-sided mid thoracic pain. No urinary symptoms. No recent illnesses. No rashes.  ED Course: Patient's initial troponin was less than 0.03.  The patient had a chest x-ray that did not reveal any acute findings.  EKG revealed normal sinus rhythm with some Q waves seen but no acute changes from previous studies.  Lipase was normal at 20.  Liver enzymes mildly elevated with an ALT of 86 and an AST of 74.  This is not significantly changed from previous studies.  The patient experienced relief in symptoms after a nebulizer treatment given in the ED.    The patient decided to refuse admission and to discharge [place].  I came and spoke with the patient and I discussed the reasons that he was being admitted into  the hospital and I discussed the risks of discharging without being fully evaluated.  The patient verbalized understanding and accepted the risks.  He decided to discharge AMA.   Discharge Diagnoses:  Principal Problem:   Chest pain at rest Active Problems:   Hyperlipidemia   OSA (obstructive sleep apnea)   CAD (coronary artery disease)   PAF (paroxysmal atrial fibrillation) (HCC)   GERD (gastroesophageal reflux disease)   COPD (chronic obstructive pulmonary disease) (HCC)   Abnormal myocardial perfusion study   Chronic kidney disease   Type 2 diabetes mellitus with vascular disease (HCC)   HTN (hypertension)   H/O Stroke   Glaucoma   SOB (shortness of breath)   Current every day smoker   Severe Vitamin D deficiency   Diastolic congestive heart failure, NYHA class 2 ([place])    Discharge Instructions:  Allergies as of 11/27/2017      Reactions   Contrast Media [iodinated Diagnostic Agents] Other (See Comments)   Pt must be premedicated before given contrast media - stops heart   Iohexol Other (See Comments)    Consult with radiologist before pre meds are given.Desc: PT. STATES HEART STOPPED HAS TO BE PREMED.     Allergies  Allergen Reactions  . Contrast Media [Iodinated Diagnostic Agents] Other (See Comments)    Pt must be premedicated before given contrast media - stops heart  . Iohexol Other (See Comments)     Consult with radiologist before pre meds are given.Desc: PT. STATES HEART STOPPED HAS TO BE PREMED.    Allergies as of 11/27/2017      Reactions  Contrast Media [iodinated Diagnostic Agents] Other (See Comments)   Pt must be premedicated before given contrast media - stops heart   Iohexol Other (See Comments)    Consult with radiologist before pre meds are given.Desc: PT. STATES HEART STOPPED HAS TO BE PREMED.     Procedures/Studies: Dg Chest Port 1 View  Result Date: 11/27/2017 CLINICAL DATA:  Chest pain, LEFT side abdominal pain, vomiting and diarrhea  since Thursday, shortness of breath with chest pain since Friday, history diabetes mellitus, hypertension, COPD, stroke, chronic kidney disease, atrial fibrillation EXAM: PORTABLE CHEST 1 VIEW COMPARISON:  Portable exam 1517 hours compared to 02/14/2017 FINDINGS: Normal heart size post median sternotomy and CABG. Mediastinal contours and pulmonary vascularity normal. No acute infiltrate, pleural effusion or pneumothorax. Bones unremarkable. IMPRESSION: No acute abnormalities. Electronically Signed   By: Lavonia Dana M.D.   On: 11/27/2017 15:54      Discharge Exam: Vitals:   11/27/17 1501 11/27/17 1609  BP: 93/65   Pulse: 96   Resp: (!) 25   Temp: 98.1 F (36.7 C)   SpO2: 96% 93%   Vitals:   11/27/17 1501 11/27/17 1502 11/27/17 1609  BP: 93/65    Pulse: 96    Resp: (!) 25    Temp: 98.1 F (36.7 C)    TempSrc: Oral    SpO2: 96%  93%  Weight:  135.2 kg (298 lb)   Height:  5\' 10"  (1.778 m)    General: Pt is alert, awake, not in acute distress Cardiovascular: RRR, S1/S2 +, no rubs, no gallops Respiratory: CTA bilaterally, no wheezing, no rhonchi Abdominal: Soft, NT, ND, bowel sounds + Extremities: no edema, no cyanosis   The results of significant diagnostics from this hospitalization (including imaging, microbiology, ancillary and laboratory) are listed below for reference.     Microbiology: No results found for this or any previous visit (from the past 240 hour(s)).   Labs: BNP (last 3 results) Recent Labs    11/27/17 1511  BNP 54.6   Basic Metabolic Panel: Recent Labs  Lab 11/27/17 1511  NA 137  K 3.5  CL 100*  CO2 23  GLUCOSE 133*  BUN 9  CREATININE 1.03  CALCIUM 9.5   Liver Function Tests: Recent Labs  Lab 11/27/17 1511  AST 74*  ALT 86*  ALKPHOS 80  BILITOT 0.6  PROT 7.7  ALBUMIN 3.9   Recent Labs  Lab 11/27/17 1511  LIPASE 20   No results for input(s): AMMONIA in the last 168 hours. CBC: Recent Labs  Lab 11/27/17 1511  WBC 10.5  HGB  17.8*  HCT 50.6  MCV 91.3  PLT 242   Cardiac Enzymes: Recent Labs  Lab 11/27/17 1511 11/27/17 1733  TROPONINI <0.03 <0.03   BNP: Invalid input(s): POCBNP CBG: No results for input(s): GLUCAP in the last 168 hours. D-Dimer No results for input(s): DDIMER in the last 72 hours. Hgb A1c No results for input(s): HGBA1C in the last 72 hours. Lipid Profile No results for input(s): CHOL, HDL, LDLCALC, TRIG, CHOLHDL, LDLDIRECT in the last 72 hours. Thyroid function studies No results for input(s): TSH, T4TOTAL, T3FREE, THYROIDAB in the last 72 hours.  Invalid input(s): FREET3 Anemia work up No results for input(s): VITAMINB12, FOLATE, FERRITIN, TIBC, IRON, RETICCTPCT in the last 72 hours. Urinalysis    Component Value Date/Time   COLORURINE YELLOW 06/02/2015 [place] 06/02/2015 1525   LABSPEC 1.010 06/02/2015 1525   PHURINE 7.5 06/02/2015 1525   GLUCOSEU 100 (  A) 06/02/2015 1525   GLUCOSEU NEG mg/dL 06/01/2010 2238   HGBUR NEGATIVE 06/02/2015 1525   BILIRUBINUR NEGATIVE 06/02/2015 1525   KETONESUR NEGATIVE 06/02/2015 1525   PROTEINUR NEGATIVE 06/02/2015 1525   UROBILINOGEN 1.0 06/02/2015 1525   NITRITE NEGATIVE 06/02/2015 1525   LEUKOCYTESUR NEGATIVE 06/02/2015 1525   Sepsis Labs Invalid input(s): PROCALCITONIN,  WBC,  LACTICIDVEN Microbiology No results found for this or any previous visit (from the past 240 hour(s)).  SIGNED:  Irwin Brakeman, MD  [facility] 11/27/2017, 6:43 PM Pager [phone]  If 7PM-7AM, please contact night-coverage [email or web address] Password [email or web address]

## 2017-11-27 NOTE — ED Notes (Signed)
Pt refusing admission. Dr. Wynetta Emery aware and reported to get pt to sign AMA form and discharge pt.

## 2017-11-27 NOTE — ED Notes (Signed)
EKG given to Dr. Dayna Barker.

## 2017-11-27 NOTE — ED Provider Notes (Signed)
Emergency Department Provider Note   I have reviewed the triage vital signs and the nursing notes.   HISTORY  Chief Complaint Chest Pain   HPI [name] is a 57 y.o. male with multiple past medical problems as diagnosed below presents emergency department with multiple complaints.  Solik the patient has some upper GI illness started on Thursday starting with nonbloody nonbilious vomiting and then having some diarrhea that is also nonbloody with it.  This persisted and on Friday started having some intermittent chest pain he describes as sharp pressure-like radiates to his left arm associated with some shortness of breath and diaphoresis.  He states it feels like previous episodes of heart attack and does have a history of a CABG.  He tried nitroglycerin which did not seem to help with the pain.  He had decreased p.o. intake during this time but afebrile.  No significant abdominal pain but does have some left-sided mid thoracic pain.  No urinary symptoms.  No recent illnesses.  No rashes. No other associated or modifying symptoms.    Past Medical History:  Diagnosis Date  . Abnormal myocardial perfusion study 01/01/2011   there a small to moderate sized inferobasal scar  . Barrett's esophagus   . Cataracts, bilateral   . Chronic kidney disease    hx of kidney stones  . Claudication ([place]) 11/16/2011   PV test perform shows normal  . COPD (chronic obstructive pulmonary disease) ([place])   . Depression   . Diabetes ([place])    type 2 diabetes mellitus  . Dysrhythmia   . GERD (gastroesophageal reflux disease)   . Glaucoma   . Hernia of abdominal wall   . HTN (hypertension)   . Hyperlipidemia   . Morbid obesity ([place])   . Myocardial infarction ([place]) 2008,2009,2009  . OSA (obstructive sleep apnea)    on cpap  . PAF (paroxysmal atrial fibrillation) ([place])   . S/P colonoscopy 2009   3-4 mm transverse colon erosions likely secondary to  ASA  . S/P endoscopy Dec 2011   moderate  erosive gastritis, Barrett's esophagus 1-2cm  . Sleep apnea   . SOB (shortness of breath) 11/03/2007   2D Echo EF 50%-55%  . Stroke [facility])     Patient Active Problem List   Diagnosis Date Noted  . Chest pain at rest 11/27/2017  . Chronic kidney disease 11/27/2017  . Type 2 diabetes mellitus with vascular disease ([place]) 11/27/2017  . HTN (hypertension) 11/27/2017  . H/O Stroke 11/27/2017  . Glaucoma 11/27/2017  . Current every day smoker 11/27/2017  . Severe Vitamin D deficiency 11/27/2017  . Diastolic congestive heart failure, NYHA class 2 ([place]) 11/27/2017  . History of colonic polyps   . Rectal bleeding 08/23/2017  . COPD (chronic obstructive pulmonary disease) ([place])   . Fracture of fibula, right, closed 06/03/2015  . Dysphagia 04/07/2015  . Obesity, unspecified 04/28/2014  . CVA (cerebral infarction) 04/27/2014  . Aphasia 04/27/2014  . Pneumothorax on right 03/16/2014  . Lung nodule 03/16/2014  . Chest pain 03/15/2014  . Angina, class III ([place]) 03/14/2013  . Tobacco abuse 03/14/2013  . Depression 03/14/2013  . Unstable angina ([place]) 12/30/2012  . Abnormal nuclear stress test 12/30/2012  . Barrett's esophagus 08/25/2011  . Abnormal myocardial perfusion study 01/01/2011  . ABDOMINAL PAIN, EPIGASTRIC 09/29/2010  . GERD (gastroesophageal reflux disease) 06/01/2010  . HEMATOCHEZIA 06/01/2010  . HEMATURIA UNSPECIFIED 06/01/2010  . ABDOMINAL PAIN 06/01/2010  . OSA (obstructive sleep apnea) 06/21/2008  . RESTLESS  LEGS SYNDROME 06/21/2008  . MYOCARDIAL INFARCTION 06/21/2008  . ALLERGIC RHINITIS 06/21/2008  . Hyperlipidemia 06/20/2008  . Morbid obesity ([place]) 06/20/2008  . Essential hypertension, benign 06/20/2008  . CAD (coronary artery disease) 06/20/2008  . PAF (paroxysmal atrial fibrillation) ([place]) 06/20/2008  . SOB (shortness of breath) 11/03/2007    Past Surgical History:  Procedure Laterality Date  . BIOPSY N/A 04/29/2015   Procedure: BIOPSY;  Surgeon: Danie Binder, MD;  Location: [facility] ORS;  Service: Endoscopy;  Laterality: N/A;  . CABG X 4  03/2008  . CARDIAC CATHETERIZATION  2009   stent placement to the left circumflex a 2.25   . CARDIAC CATHETERIZATION  07/08/2010  . COLONOSCOPY WITH PROPOFOL N/A 09/27/2017   Procedure: COLONOSCOPY WITH PROPOFOL;  Surgeon: Danie Binder, MD;  Location: [facility] SU[facility];  Service: Endoscopy;  Laterality: N/A;  12:00pm  . CORONARY ARTERY BYPASS GRAFT  2008   4 vessels  . CORONARY STENT PLACEMENT  12/29/12  . CORONARY STENT PLACEMENT  12/2012  . ESOPHAGEAL DILATION N/A 04/29/2015   Procedure: ESOPHAGEAL DILATION 15 mm, 16 mm;  Surgeon: Danie Binder, MD;  Location: [facility];  Service: Endoscopy;  Laterality: N/A;  . ESOPHAGOGASTRODUODENOSCOPY  09/2011   Barrett's esophagus, no dysplasia on biopsies. Distal esophagitis. Status post dilation. Moderate gastritis and duodenitis, but biopsies benign. Next EGD in November 2015 for surveillance of Barrett's esophagus.  . ESOPHAGOGASTRODUODENOSCOPY (EGD) WITH PROPOFOL N/A 04/29/2015   SLF: 1. Barretts esophagus 2. Moderate non-erosive gastritis.   Marland Kitchen HERNIA REPAIR     ventral hernia repair  . LEFT HEART CATHETERIZATION WITH CORONARY/GRAFT ANGIOGRAM N/A 12/29/2012   Procedure: LEFT HEART CATHETERIZATION WITH Beatrix Fetters;  Surgeon: Troy Sine, MD;  Location: [facility] CATH LAB;  Service: Cardiovascular;  Laterality: N/A;  . ORIF FIBULA FRACTURE Right 06/03/2015   Procedure: OPEN REDUCTION INTERNAL FIXATION (ORIF) DISTAL FIBULA  FRACTURE;  Surgeon: Garald Balding, MD;  Location: [place];  Service: Orthopedics;  Laterality: Right;  . PERCUTANEOUS CORONARY STENT INTERVENTION (PCI-S)  12/29/2012   Procedure: PERCUTANEOUS CORONARY STENT INTERVENTION (PCI-S);  Surgeon: Troy Sine, MD;  Location: [facility] CATH LAB;  Service: Cardiovascular;;  . POLYPECTOMY  09/27/2017   Procedure: POLYPECTOMY;  Surgeon: Danie Binder, MD;  [facility];  Service: Endoscopy;;  cecal  polyp, ascending polyps x6, transverse colon polyps x6, splenic flexure polyps x2, descending colon polyps x6, sigmoid  colon polyp x1, rectal polyp x1   . SAVORY DILATION  09/06/2011   Procedure: SAVORY DILATION;  Surgeon: Dorothyann Peng, MD;  Location: [facility];  Service: Endoscopy;  Laterality: N/A;  Dilated with 80mm    Current Outpatient Rx  . Order #: [id number] Class: OTC  . Order #: [id number] Class: Historical Med  . Order #: [id number] Class: Historical Med  . Order #: [id number] Class: Historical Med  . Order #: [id number] Class: Historical Med  . Order #: [id number] Class: Historical Med  . Order #: [id number] Class: Normal  . Order #: [id number] Class: Normal  . Order #: [id number] Class: Historical Med  . Order #: [id number] Class: Historical Med  . Order #: [id number] Class: Normal  . Order #: [id number] Class: Historical Med  . Order #: [id number] Class: Historical Med  . Order #: [id number] Class: Normal  . Order #: [id number] Class: Normal  . Order #: [id number] Class: Historical Med  . Order #: [id number] Class: Fill Later  . Order #: [id number] Class: Normal  . Order #: [id number] Class: Normal  . Order #: [id number] Class: Normal  . Order #: [id number] Class: Normal  .  Order #: [id number] Class: Normal  . Order #: [id number] Class: Normal  . Order #: [id number] Class: Normal    Allergies Contrast media [iodinated diagnostic agents] and Iohexol  Family History  Problem Relation Age of Onset  . Diabetes Mother   . Heart disease Mother   . Diabetes Father   . Heart disease Father   . Pancreatic cancer Brother        age 46, doing well  . Colon cancer Neg Hx   . Anesthesia problems Neg Hx   . Hypotension Neg Hx   . Malignant hyperthermia Neg Hx   . Pseudochol deficiency Neg Hx     Social History Social History   Tobacco Use  . Smoking status: Current Every Day Smoker    Packs/day: 0.50    Years: 30.00    Pack years: 15.00    Types: Cigarettes  . Smokeless tobacco: Never Used  Substance Use  Topics  . Alcohol use: Yes    Comment: occ  . Drug use: No    Review of Systems  All other systems negative except as documented in the HPI. All pertinent positives and negatives as reviewed in the HPI. ____________________________________________   PHYSICAL EXAM:  VITAL SIGNS: ED Triage Vitals  Enc Vitals Group     BP 11/27/17 1501 93/65     Pulse Rate 11/27/17 1501 96     Resp 11/27/17 1501 (!) 25     Temp 11/27/17 1501 98.1 F (36.7 C)     Temp Source 11/27/17 1501 Oral     SpO2 11/27/17 1501 96 %     Weight 11/27/17 1502 298 lb (135.2 kg)     Height 11/27/17 1502 5\' 10"  (1.778 m)    Constitutional: Alert and oriented. Well appearing and in no acute distress. Eyes: Conjunctivae are normal. PERRL. EOMI. Head: Atraumatic. Nose: No congestion/rhinnorhea. Mouth/Throat: Mucous membranes are moist.  Oropharynx non-erythematous. Neck: No stridor.  No meningeal signs.   Cardiovascular: Normal rate, regular rhythm. Good peripheral circulation. Grossly normal heart sounds.   Respiratory: Normal respiratory effort.  No retractions. Lungs diminished with some wheezing. Gastrointestinal: Soft and nontender. No distention.  Musculoskeletal: No lower extremity tenderness nor edema. No gross deformities of extremities. Neurologic:  Normal speech and language. No gross focal neurologic deficits are appreciated.  Skin:  Skin is warm, dry and intact. No rash noted.   ____________________________________________   LABS (all labs ordered are listed, but only abnormal results are displayed)  Labs Reviewed  BASIC METABOLIC PANEL - Abnormal; Notable for the following components:      Result Value   Chloride 100 (*)    Glucose, Bld 133 (*)    All other components within normal limits  CBC - Abnormal; Notable for the following components:   Hemoglobin 17.8 (*)    All other components within normal limits  HEPATIC FUNCTION PANEL - Abnormal; Notable for the following components:   AST  74 (*)    ALT 86 (*)    All other components within normal limits  TROPONIN I  BRAIN NATRIURETIC PEPTIDE  TROPONIN I  LIPASE, BLOOD  HIV ANTIBODY (ROUTINE TESTING)  TROPONIN I  TROPONIN I  CBC  COMPREHENSIVE METABOLIC PANEL  LIPASE, BLOOD   ____________________________________________  EKG   EKG Interpretation  Date/Time:  Sunday November 27 2017 15:18:49 EST Ventricular Rate:  90 PR Interval:    QRS Duration: 99 QT Interval:  399 QTC Calculation: 489 R Axis:   58 Text Interpretation:  Sinus rhythm Abnormal  R-wave progression, early transition Abnormal inferior Q waves Borderline repolarization abnormality Borderline prolonged QT interval inferior changes similar to november 2018 Confirmed by Merrily Pew [phone]) on 11/27/2017 3:41:07 PM       EKG Interpretation  Date/Time:  Sunday November 27 2017 15:28:20 EST Ventricular Rate:  94 PR Interval:    QRS Duration: 91 QT Interval:  381 QTC Calculation: 477 R Axis:   58 Text Interpretation:  Sinus rhythm Abnormal R-wave progression, early transition Consider inferior infarct Borderline repolarization abnormality simiilar to 10 minutes prior, maybe slightly improved elevation III Confirmed by Merrily Pew [phone]) on 11/27/2017 3:42:32 PM       ____________________________________________  RADIOLOGY  Dg Chest Port 1 View  Result Date: 11/27/2017 CLINICAL DATA:  Chest pain, LEFT side abdominal pain, vomiting and diarrhea since Thursday, shortness of breath with chest pain since Friday, history diabetes mellitus, hypertension, COPD, stroke, chronic kidney disease, atrial fibrillation EXAM: PORTABLE CHEST 1 VIEW COMPARISON:  Portable exam 1517 hours compared to 02/14/2017 FINDINGS: Normal heart size post median sternotomy and CABG. Mediastinal contours and pulmonary vascularity normal. No acute infiltrate, pleural effusion or pneumothorax. Bones unremarkable. IMPRESSION: No acute abnormalities. Electronically Signed   By: Lavonia Dana M.D.   On: 11/27/2017 15:54    ____________________________________________   PROCEDURES  Procedure(s) performed:   Procedures   ____________________________________________   INITIAL IMPRESSION / ASSESSMENT AND PLAN / ED COURSE  Suspect patient likely has a GI illness which will treat appropriately.  However his chest pain and abnormal EKG are somewhat concerning.  ecg with mild elevation and deep q waves in inferior leads. Not meeting STEMI criteria. Similar to previous done in November. Also has a lot of GI symptoms likely contributing. Will check troponin and serial ecg's but at this time not Code STEMI.   Will workup the GI issues while ruling out ACS patient will likely need to be admitted for the same.  Patient will be admitted to medicine for further workup.   Pertinent labs & imaging results that were available during my care of the patient were reviewed by me and considered in my medical decision making (see chart for details).  ____________________________________________  FINAL CLINICAL IMPRESSION(S) / ED DIAGNOSES  Final diagnoses:  Nonspecific chest pain     MEDICATIONS GIVEN DURING THIS VISIT:  Medications  acetaminophen (TYLENOL) tablet 650 mg (not administered)  ondansetron (ZOFRAN) injection 4 mg (not administered)  insulin aspart (novoLOG) injection 0-5 Units (not administered)  insulin aspart (novoLOG) injection 8 Units (not administered)  insulin aspart (novoLOG) injection 0-20 Units (not administered)  amLODipine (NORVASC) tablet 10 mg (not administered)  apixaban (ELIQUIS) tablet 5 mg (not administered)  fenofibrate tablet 54 mg (not administered)  diazepam (VALIUM) tablet 5 mg (not administered)  escitalopram (LEXAPRO) tablet 20 mg (not administered)  Fluticasone-Umeclidin-Vilant 100-62.5-25 MCG/INH AEPB 1 puff (not administered)  isosorbide mononitrate (IMDUR) 24 hr tablet 60 mg (not administered)  lisinopril (PRINIVIL,ZESTRIL)  tablet 5 mg (not administered)  meclizine (ANTIVERT) tablet 25 mg (not administered)  metoprolol tartrate (LOPRESSOR) tablet 100 mg (not administered)  nitroGLYCERIN (NITROLINGUAL) 0.4 MG/SPRAY spray 1 spray (not administered)  pantoprazole (PROTONIX) EC tablet 40 mg (not administered)  potassium chloride SA (K-DUR,KLOR-CON) CR tablet 20 mEq (not administered)  famotidine (PEPCID) tablet 20 mg (not administered)  Vitamin D (Ergocalciferol) (DRISDOL) capsule 50,000 Units (not administered)  ipratropium-albuterol (DUONEB) 0.5-2.5 (3) MG/3ML nebulizer solution 3 mL (not administered)  insulin glargine (LANTUS) injection 30 Units (not administered)  aspirin EC tablet 81  mg (not administered)  furosemide (LASIX) tablet 80 mg (not administered)  nicotine (NICODERM CQ - dosed in mg/24 hours) patch 14 mg (not administered)  morphine 2 MG/ML injection 1 mg (not administered)  gi cocktail (Maalox,Lidocaine,Donnatal) (not administered)  sodium chloride 0.9 % bolus 1,000 mL (0 mLs Intravenous Stopped 11/27/17 1859)  ondansetron (ZOFRAN) injection 4 mg (4 mg Intravenous Given 11/27/17 1529)  fentaNYL (SUBLIMAZE) injection 50 mcg (50 mcg Intravenous Given 11/27/17 1529)  aspirin chewable tablet 324 mg (324 mg Oral Given 11/27/17 1601)  ipratropium-albuterol (DUONEB) 0.5-2.5 (3) MG/3ML nebulizer solution 3 mL (3 mLs Nebulization Given 11/27/17 1609)     NEW OUTPATIENT MEDICATIONS STARTED DURING THIS VISIT:  Discharge Medication List as of 11/27/2017  6:59 PM      Note:  This note was prepared with assistance of Dragon voice recognition software. Occasional wrong-word or sound-a-like substitutions may have occurred due to the inherent limitations of voice recognition software.   Merrily Pew, MD 11/27/17 [phone]

## 2017-11-27 NOTE — ED Notes (Signed)
Dr. Dayna Barker made aware of patient.

## 2017-11-29 LAB — HIV ANTIBODY (ROUTINE TESTING W REFLEX): HIV Screen 4th Generation wRfx: NONREACTIVE

## 2017-12-01 DIAGNOSIS — I25119 Atherosclerotic heart disease of native coronary artery with unspecified angina pectoris: Secondary | ICD-10-CM | POA: Diagnosis not present

## 2017-12-01 DIAGNOSIS — G4733 Obstructive sleep apnea (adult) (pediatric): Secondary | ICD-10-CM | POA: Diagnosis not present

## 2017-12-01 DIAGNOSIS — J9611 Chronic respiratory failure with hypoxia: Secondary | ICD-10-CM | POA: Diagnosis not present

## 2017-12-01 DIAGNOSIS — J449 Chronic obstructive pulmonary disease, unspecified: Secondary | ICD-10-CM | POA: Diagnosis not present

## 2017-12-08 ENCOUNTER — Other Ambulatory Visit: Payer: Self-pay | Admitting: Cardiology

## 2018-01-04 ENCOUNTER — Other Ambulatory Visit: Payer: Self-pay | Admitting: Cardiology

## 2018-01-05 ENCOUNTER — Ambulatory Visit (INDEPENDENT_AMBULATORY_CARE_PROVIDER_SITE_OTHER): Payer: Medicare HMO | Admitting: Otolaryngology

## 2018-01-05 DIAGNOSIS — K219 Gastro-esophageal reflux disease without esophagitis: Secondary | ICD-10-CM

## 2018-01-05 DIAGNOSIS — R490 Dysphonia: Secondary | ICD-10-CM

## 2018-01-05 DIAGNOSIS — H6123 Impacted cerumen, bilateral: Secondary | ICD-10-CM

## 2018-01-16 DIAGNOSIS — E1159 Type 2 diabetes mellitus with other circulatory complications: Secondary | ICD-10-CM | POA: Diagnosis not present

## 2018-01-16 DIAGNOSIS — I10 Essential (primary) hypertension: Secondary | ICD-10-CM | POA: Diagnosis not present

## 2018-01-16 DIAGNOSIS — Z951 Presence of aortocoronary bypass graft: Secondary | ICD-10-CM | POA: Diagnosis not present

## 2018-01-16 DIAGNOSIS — Z794 Long term (current) use of insulin: Secondary | ICD-10-CM | POA: Diagnosis not present

## 2018-01-16 DIAGNOSIS — I25708 Atherosclerosis of coronary artery bypass graft(s), unspecified, with other forms of angina pectoris: Secondary | ICD-10-CM | POA: Diagnosis not present

## 2018-01-16 DIAGNOSIS — I480 Paroxysmal atrial fibrillation: Secondary | ICD-10-CM | POA: Diagnosis not present

## 2018-01-18 DIAGNOSIS — J9611 Chronic respiratory failure with hypoxia: Secondary | ICD-10-CM | POA: Diagnosis not present

## 2018-01-18 DIAGNOSIS — I10 Essential (primary) hypertension: Secondary | ICD-10-CM | POA: Diagnosis not present

## 2018-01-18 DIAGNOSIS — E669 Obesity, unspecified: Secondary | ICD-10-CM | POA: Diagnosis not present

## 2018-01-18 DIAGNOSIS — J449 Chronic obstructive pulmonary disease, unspecified: Secondary | ICD-10-CM | POA: Diagnosis not present

## 2018-01-25 ENCOUNTER — Encounter: Payer: Self-pay | Admitting: "Endocrinology

## 2018-01-25 DIAGNOSIS — Z794 Long term (current) use of insulin: Secondary | ICD-10-CM | POA: Diagnosis not present

## 2018-01-25 DIAGNOSIS — H25013 Cortical age-related cataract, bilateral: Secondary | ICD-10-CM | POA: Diagnosis not present

## 2018-01-25 DIAGNOSIS — E119 Type 2 diabetes mellitus without complications: Secondary | ICD-10-CM | POA: Diagnosis not present

## 2018-01-25 DIAGNOSIS — H2513 Age-related nuclear cataract, bilateral: Secondary | ICD-10-CM | POA: Diagnosis not present

## 2018-01-25 DIAGNOSIS — E118 Type 2 diabetes mellitus with unspecified complications: Secondary | ICD-10-CM | POA: Diagnosis not present

## 2018-01-25 LAB — HM DIABETES EYE EXAM

## 2018-02-10 ENCOUNTER — Other Ambulatory Visit: Payer: Self-pay | Admitting: "Endocrinology

## 2018-02-14 ENCOUNTER — Other Ambulatory Visit: Payer: Self-pay

## 2018-02-14 MED ORDER — ONETOUCH DELICA LANCETS FINE MISC: 1.0000 | Freq: Four times a day (QID) | 5 refills | Status: DC

## 2018-02-16 ENCOUNTER — Ambulatory Visit: Payer: Medicare HMO | Admitting: "Endocrinology

## 2018-02-23 ENCOUNTER — Other Ambulatory Visit: Payer: Self-pay | Admitting: "Endocrinology

## 2018-02-23 ENCOUNTER — Ambulatory Visit: Payer: Medicare HMO | Admitting: "Endocrinology

## 2018-02-23 DIAGNOSIS — E119 Type 2 diabetes mellitus without complications: Secondary | ICD-10-CM | POA: Diagnosis not present

## 2018-02-23 DIAGNOSIS — E1159 Type 2 diabetes mellitus with other circulatory complications: Secondary | ICD-10-CM | POA: Diagnosis not present

## 2018-02-24 LAB — COMPREHENSIVE METABOLIC PANEL
A/G RATIO: 1.9 (ref 1.2–2.2)
ALBUMIN: 4.3 g/dL (ref 3.5–5.5)
ALK PHOS: 93 IU/L (ref 39–117)
ALT: 66 IU/L — ABNORMAL HIGH (ref 0–44)
AST: 43 IU/L — ABNORMAL HIGH (ref 0–40)
BILIRUBIN TOTAL: 0.6 mg/dL (ref 0.0–1.2)
BUN / CREAT RATIO: 10 (ref 9–20)
BUN: 10 mg/dL (ref 6–24)
CHLORIDE: 103 mmol/L (ref 96–106)
CO2: 24 mmol/L (ref 20–29)
Calcium: 9.6 mg/dL (ref 8.7–10.2)
Creatinine, Ser: 1.04 mg/dL (ref 0.76–1.27)
GFR calc non Af Amer: 80 mL/min/{1.73_m2} (ref >59)
GFR, EST AFRICAN AMERICAN: 93 mL/min/{1.73_m2} (ref >59)
GLOBULIN, TOTAL: 2.3 g/dL (ref 1.5–4.5)
GLUCOSE: 210 mg/dL — AB (ref 65–99)
Potassium: 4 mmol/L (ref 3.5–5.2)
Sodium: 145 mmol/L — ABNORMAL HIGH (ref 134–144)
TOTAL PROTEIN: 6.6 g/dL (ref 6.0–8.5)

## 2018-02-24 LAB — SPECIMEN STATUS REPORT

## 2018-02-24 LAB — HGB A1C W/O EAG: HEMOGLOBIN A1C: 7.6 % — AB (ref 4.8–5.6)

## 2018-02-25 ENCOUNTER — Other Ambulatory Visit: Payer: Self-pay | Admitting: Cardiology

## 2018-02-27 ENCOUNTER — Other Ambulatory Visit: Payer: Self-pay | Admitting: "Endocrinology

## 2018-03-01 ENCOUNTER — Ambulatory Visit (INDEPENDENT_AMBULATORY_CARE_PROVIDER_SITE_OTHER): Payer: Medicare HMO | Admitting: "Endocrinology

## 2018-03-01 ENCOUNTER — Encounter: Payer: Self-pay | Admitting: "Endocrinology

## 2018-03-01 VITALS — BP 149/86 | HR 80 | Ht 70.0 in | Wt 303.0 lb

## 2018-03-01 DIAGNOSIS — E1159 Type 2 diabetes mellitus with other circulatory complications: Secondary | ICD-10-CM

## 2018-03-01 DIAGNOSIS — I10 Essential (primary) hypertension: Secondary | ICD-10-CM

## 2018-03-01 DIAGNOSIS — E782 Mixed hyperlipidemia: Secondary | ICD-10-CM | POA: Diagnosis not present

## 2018-03-01 DIAGNOSIS — I25119 Atherosclerotic heart disease of native coronary artery with unspecified angina pectoris: Secondary | ICD-10-CM | POA: Diagnosis not present

## 2018-03-01 DIAGNOSIS — J449 Chronic obstructive pulmonary disease, unspecified: Secondary | ICD-10-CM | POA: Diagnosis not present

## 2018-03-01 DIAGNOSIS — G4733 Obstructive sleep apnea (adult) (pediatric): Secondary | ICD-10-CM | POA: Diagnosis not present

## 2018-03-01 DIAGNOSIS — J9611 Chronic respiratory failure with hypoxia: Secondary | ICD-10-CM | POA: Diagnosis not present

## 2018-03-01 MED ORDER — INSULIN ISOPHANE & REGULAR (HUMAN 70-30)100 UNIT/ML KWIKPEN: PEN_INJECTOR | SUBCUTANEOUS | 2 refills | Status: DC

## 2018-03-01 NOTE — Patient Instructions (Signed)

## 2018-03-01 NOTE — Progress Notes (Signed)
Subjective:    Patient ID: [name], male    DOB: [birth year].  he is being seen in follow-up for management of currently uncontrolled symptomatic diabetes requested by  Sharilyn Sites, MD.   Past Medical History:  Diagnosis Date  . Abnormal myocardial perfusion study 01/01/2011   there a small to moderate sized inferobasal scar  . Barrett's esophagus   . Cataracts, bilateral   . Chronic kidney disease    hx of kidney stones  . Claudication ([place]) 11/16/2011   PV test perform shows normal  . COPD (chronic obstructive pulmonary disease) ([place])   . Depression   . Diabetes ([place])    type 2 diabetes mellitus  . Dysrhythmia   . GERD (gastroesophageal reflux disease)   . Glaucoma   . Hernia of abdominal wall   . HTN (hypertension)   . Hyperlipidemia   . Morbid obesity ([place])   . Myocardial infarction ([place]) 2008,2009,2009  . OSA (obstructive sleep apnea)    on cpap  . PAF (paroxysmal atrial fibrillation) ([place])   . S/P colonoscopy 2009   3-4 mm transverse colon erosions likely secondary to  ASA  . S/P endoscopy Dec 2011   moderate erosive gastritis, Barrett's esophagus 1-2cm  . Sleep apnea   . SOB (shortness of breath) 11/03/2007   2D Echo EF 50%-55%  . Stroke [facility])    Past Surgical History:  Procedure Laterality Date  . BIOPSY N/A 04/29/2015   Procedure: BIOPSY;  Surgeon: Danie Binder, MD;  Location: [facility];  Service: Endoscopy;  Laterality: N/A;  . CABG X 4  03/2008  . CARDIAC CATHETERIZATION  2009   stent placement to the left circumflex a 2.25   . CARDIAC CATHETERIZATION  07/08/2010  . COLONOSCOPY WITH PROPOFOL N/A 09/27/2017   Procedure: COLONOSCOPY WITH PROPOFOL;  Surgeon: Danie Binder, MD;  Location: [facility];  Service: Endoscopy;  Laterality: N/A;  12:00pm  . CORONARY ARTERY BYPASS GRAFT  2008   4 vessels  . CORONARY STENT PLACEMENT  12/29/12  . CORONARY STENT PLACEMENT  12/2012  . ESOPHAGEAL DILATION N/A 04/29/2015   Procedure: ESOPHAGEAL DILATION  15 mm, 16 mm;  Surgeon: Danie Binder, MD;  Location: [facility];  Service: Endoscopy;  Laterality: N/A;  . ESOPHAGOGASTRODUODENOSCOPY  09/2011   Barrett's esophagus, no dysplasia on biopsies. Distal esophagitis. Status post dilation. Moderate gastritis and duodenitis, but biopsies benign. Next EGD in November 2015 for surveillance of Barrett's esophagus.  . ESOPHAGOGASTRODUODENOSCOPY (EGD) WITH PROPOFOL N/A 04/29/2015   SLF: 1. Barretts esophagus 2. Moderate non-erosive gastritis.   Marland Kitchen HERNIA REPAIR     ventral hernia repair  . LEFT HEART CATHETERIZATION WITH CORONARY/GRAFT ANGIOGRAM N/A 12/29/2012   Procedure: LEFT HEART CATHETERIZATION WITH Beatrix Fetters;  Surgeon: Troy Sine, MD;  Location: [facility] CATH LAB;  Service: Cardiovascular;  Laterality: N/A;  . ORIF FIBULA FRACTURE Right 06/03/2015   Procedure: OPEN REDUCTION INTERNAL FIXATION (ORIF) DISTAL FIBULA  FRACTURE;  Surgeon: Garald Balding, MD;  Location: [place];  Service: Orthopedics;  Laterality: Right;  . PERCUTANEOUS CORONARY STENT INTERVENTION (PCI-S)  12/29/2012   Procedure: PERCUTANEOUS CORONARY STENT INTERVENTION (PCI-S);  Surgeon: Troy Sine, MD;  Location: [facility] CATH LAB;  Service: Cardiovascular;;  . POLYPECTOMY  09/27/2017   Procedure: POLYPECTOMY;  Surgeon: Danie Binder, MD;  [facility];  Service: Endoscopy;;  cecal polyp, ascending polyps x6, transverse colon polyps x6, splenic flexure polyps x2, descending colon polyps x6, sigmoid  colon polyp x1, rectal polyp x1   . SAVORY DILATION  09/06/2011   Procedure: SAVORY DILATION;  Surgeon: Dorothyann Peng, MD;  Location: [facility];  Service: Endoscopy;  Laterality: N/A;  Dilated with 77m   Social History   Socioeconomic History  . Marital status: Divorced    Spouse name: Not on file  . Number of children: Not on file  . Years of education: Not on file  . Highest education level: Not on file  Occupational History  . Not on file  Social Needs  . Financial  resource strain: Not on file  . Food insecurity:    Worry: Not on file    Inability: Not on file  . Transportation needs:    Medical: Not on file    Non-medical: Not on file  Tobacco Use  . Smoking status: Current Every Day Smoker    Packs/day: 0.50    Years: 30.00    Pack years: 15.00    Types: Cigarettes  . Smokeless tobacco: Never Used  Substance and Sexual Activity  . Alcohol use: Yes    Comment: occ  . Drug use: No  . Sexual activity: Never  Lifestyle  . Physical activity:    Days per week: Not on file    Minutes per session: Not on file  . Stress: Not on file  Relationships  . Social connections:    Talks on phone: Not on file    Gets together: Not on file    Attends religious service: Not on file    Active member of club or organization: Not on file    Attends meetings of clubs or organizations: Not on file    Relationship status: Not on file  Other Topics Concern  . Not on file  Social History Narrative  . Not on file   Outpatient Encounter Medications as of 03/01/2018  Medication Sig  . amLODipine (NORVASC) 10 MG tablet Take 1 tablet (10 mg total) by mouth daily.  .Marland KitchenamLODipine (NORVASC) 5 MG tablet TAKE 1 TABLET BY MOUTH EVERY DAY  . apixaban (ELIQUIS) 5 MG TABS tablet Take 1 tablet (5 mg total) by mouth 2 (two) times daily.  . Blood Glucose Monitoring Suppl (ACCU-CHEK GUIDE) w/Device KIT 1 each by Does not apply route 4 (four) times daily.  . Choline Fenofibrate (TRILIPIX) 135 MG capsule Take 135 mg by mouth at bedtime.   . diazepam (VALIUM) 5 MG tablet Take 5 mg by mouth 2 (two) times daily.  .Marland Kitchenescitalopram (LEXAPRO) 20 MG tablet Take 20 mg daily by mouth.   . Fluticasone-Umeclidin-Vilant (TRELEGY ELLIPTA) 100-62.5-25 MCG/INH AEPB Inhale 1 puff daily into the lungs.   . furosemide (LASIX) 80 MG tablet Take 80 mg by mouth daily.  .Marland Kitchenglucose blood (ACCU-CHEK GUIDE) test strip Use as instructed 4 x daily. E11.65  . Insulin Isophane & Regular Human (HUMULIN 70/30  MIX) (70-30) 100 UNIT/ML PEN INJECT 40 UNITS WITH BREAKFAST AND 30 UNITS WITH SUPPER WHEN PRE-MEAL BLOOD GLUCOSE IS ABOVE 90  . Insulin Pen Needle (B-D ULTRAFINE III SHORT PEN) 31G X 8 MM MISC 1 each by Does not apply route as directed. (Patient not taking: Reported on 09/12/2017)  . Insulin Pen Needle (PEN NEEDLES) 32G X 6 MM MISC 1 each by Does not apply route 4 (four) times daily.  . isosorbide mononitrate (IMDUR) 60 MG 24 hr tablet Take 1 tablet (60 mg total) by mouth 3 (three) times daily. Please make an appointment for additional refills (  Patient taking differently: Take 60 mg by mouth 2 (two) times daily. )  . JANUVIA 100 MG tablet Take 50 mg by mouth daily.   Marland Kitchen LANCETS ULTRA FINE MISC 1 each by Does not apply route 4 (four) times daily.  Marland Kitchen lisinopril (PRINIVIL,ZESTRIL) 5 MG tablet Take 5 mg by mouth daily.  . meclizine (ANTIVERT) 25 MG tablet Take 25 mg by mouth 2 (two) times daily as needed for dizziness.  . metoprolol tartrate (LOPRESSOR) 100 MG tablet Take 1 tablet (100 mg total) by mouth 2 (two) times daily.  . modafinil (PROVIGIL) 100 MG tablet Take 100 mg daily by mouth.   . nitroGLYCERIN (NITROLINGUAL) 0.4 MG/SPRAY spray Place 1 spray under the tongue every 5 (five) minutes x 3 doses as needed for chest pain.  Glory Rosebush DELICA LANCETS FINE MISC 1 each by Does not apply route 4 (four) times daily. e11.65  . pantoprazole (PROTONIX) 40 MG tablet TAKE 1 TABLET BY MOUTH 30 MINUTES BEFORE BREAKFAST (Patient taking differently: TAKE 40 MG BY MOUTH 30 MINUTES BEFORE BREAKFAST)  . potassium chloride SA (K-DUR,KLOR-CON) 20 MEQ tablet Take 1 tablet (20 mEq total) by mouth 2 (two) times daily.  . ranitidine (ZANTAC) 150 MG capsule Take 150 mg at bedtime by mouth.   . Vitamin D, Ergocalciferol, (DRISDOL) 50000 units CAPS capsule TAKE 1 CAPSULE BY MOUTH EVERY 7 DAYS  . [DISCONTINUED] Insulin Isophane & Regular Human (HUMULIN 70/30 MIX) (70-30) 100 UNIT/ML PEN INJECT 40 UNITS WITH BREAKFAST AND 30  UNITS WITH SUPPER WHEN PRE-MEAL BLOOD GLUCOSE IS ABOVE 90  . [DISCONTINUED] sitaGLIPtin (JANUVIA) 25 MG tablet Take 1 tablet (25 mg total) by mouth daily with breakfast.   No facility-administered encounter medications on file as of 03/01/2018.     ALLERGIES: Allergies  Allergen Reactions  . Contrast Media [Iodinated Diagnostic Agents] Other (See Comments)    Pt must be premedicated before given contrast media - stops heart  . Iohexol Other (See Comments)     Consult with radiologist before pre meds are given.Desc: PT. STATES HEART STOPPED HAS TO BE PREMED.     VACCINATION STATUS:  There is no immunization history on file for this patient.  Diabetes  He presents for his follow-up diabetic visit. He has type 2 diabetes mellitus. Onset time: He was diagnosed at approximate age of 89 years. His disease course has been improving. There are no hypoglycemic associated symptoms. Pertinent negatives for hypoglycemia include no confusion, headaches, pallor or seizures. Associated symptoms include polydipsia and polyuria. Pertinent negatives for diabetes include no blurred vision, no chest pain, no fatigue, no polyphagia and no weakness. There are no hypoglycemic complications. Symptoms are improving. Diabetic complications include a CVA and heart disease. Risk factors for coronary artery disease include diabetes mellitus, dyslipidemia, hypertension, male sex, obesity, tobacco exposure, sedentary lifestyle and family history. Current diabetic treatments: He was supposed to be on basal insulin, unfortunately he ran out and could not afford refills and he did not call clinic for alternatives. He is compliant with treatment none of the time. His weight is increasing steadily. He is following a generally unhealthy diet. When asked about meal planning, he reported none. He has not had a previous visit with a dietitian. He never participates in exercise. His home blood glucose trend is decreasing steadily. His  breakfast blood glucose range is generally 180-200 mg/dl. His lunch blood glucose range is generally 180-200 mg/dl. His dinner blood glucose range is generally 180-200 mg/dl. His bedtime blood glucose range is  generally 180-200 mg/dl. His overall blood glucose range is 180-200 mg/dl. An ACE inhibitor/angiotensin II receptor blocker is being taken. He does not see a podiatrist.Eye exam is not current.  Hyperlipidemia  This is a chronic problem. The current episode started more than 1 year ago. The problem is uncontrolled. Recent lipid tests were reviewed and are high. Exacerbating diseases include diabetes and obesity. Pertinent negatives include no chest pain, myalgias or shortness of breath. Current antihyperlipidemic treatment includes statins. Compliance problems include medication cost and psychosocial issues.  Risk factors for coronary artery disease include dyslipidemia, diabetes mellitus, hypertension, male sex, obesity, family history and a sedentary lifestyle.  Hypertension  This is a chronic problem. The current episode started more than 1 year ago. The problem is uncontrolled. Pertinent negatives include no blurred vision, chest pain, headaches, neck pain, palpitations or shortness of breath. Risk factors for coronary artery disease include diabetes mellitus, dyslipidemia, male gender, obesity, family history, sedentary lifestyle and smoking/tobacco exposure. Past treatments include ACE inhibitors. Hypertensive end-organ damage includes CVA.    Review of Systems  Constitutional: Negative for chills, fatigue, fever and unexpected weight change.  HENT: Negative for dental problem, mouth sores and trouble swallowing.   Eyes: Negative for blurred vision and visual disturbance.  Respiratory: Negative for cough, choking, chest tightness, shortness of breath and wheezing.   Cardiovascular: Negative for chest pain, palpitations and leg swelling.  Gastrointestinal: Negative for abdominal distention,  abdominal pain, constipation, diarrhea, nausea and vomiting.  Endocrine: Positive for polydipsia and polyuria. Negative for polyphagia.  Genitourinary: Negative for dysuria, flank pain, hematuria and urgency.  Musculoskeletal: Negative for back pain, gait problem, myalgias and neck pain.  Skin: Negative for pallor, rash and wound.  Neurological: Negative for seizures, syncope, weakness, numbness and headaches.  Psychiatric/Behavioral: Negative.  Negative for confusion and dysphoric mood.    Objective:    BP (!) 149/86   Pulse 80   Ht '5\' 10"'  (1.778 m)   Wt (!) 303 lb (137.4 kg)   BMI 43.48 kg/m   Wt Readings from Last 3 Encounters:  03/01/18 (!) 303 lb (137.4 kg)  11/27/17 298 lb (135.2 kg)  11/23/17 298 lb (135.2 kg)     Physical Exam  Constitutional: He is oriented to person, place, and time. He appears well-developed. He is cooperative. No distress.  Poor personal hygiene.  HENT:  Head: Normocephalic and atraumatic.  Eyes: EOM are normal.  Neck: Normal range of motion. Neck supple. No tracheal deviation present. No thyromegaly present.  Cardiovascular: Normal rate, S1 normal and S2 normal. Exam reveals no gallop.  No murmur heard. Pulses:      Dorsalis pedis pulses are 1+ on the right side, and 1+ on the left side.       Posterior tibial pulses are 1+ on the right side, and 1+ on the left side.  Pulmonary/Chest: Effort normal. No respiratory distress. He has no wheezes.  Abdominal: Bowel sounds are normal. He exhibits no distension. There is no tenderness. There is no guarding and no CVA tenderness.  Musculoskeletal: He exhibits no edema.       Right shoulder: He exhibits no swelling and no deformity.  Neurological: He is alert and oriented to person, place, and time. He has normal strength and normal reflexes. No cranial nerve deficit or sensory deficit. Gait normal.  Skin: Skin is warm and dry. No rash noted. No cyanosis. Nails show no clubbing.  Psychiatric: His speech  is normal. Cognition and memory are normal.  Reluctant  affect. Unconcerned attitude.   CMP ( most recent) CMP     Component Value Date/Time   NA 145 (H) 02/23/2018 1410   K 4.0 02/23/2018 1410   CL 103 02/23/2018 1410   CO2 24 02/23/2018 1410   GLUCOSE 210 (H) 02/23/2018 1410   GLUCOSE 133 (H) 11/27/2017 1511   BUN 10 02/23/2018 1410   CREATININE 1.04 02/23/2018 1410   CALCIUM 9.6 02/23/2018 1410   PROT 6.6 02/23/2018 1410   ALBUMIN 4.3 02/23/2018 1410   AST 43 (H) 02/23/2018 1410   ALT 66 (H) 02/23/2018 1410   ALKPHOS 93 02/23/2018 1410   BILITOT 0.6 02/23/2018 1410   GFRNONAA 80 02/23/2018 1410   GFRAA 93 02/23/2018 1410   Diabetic Labs (most recent): Lab Results  Component Value Date   HGBA1C 7.6 (H) 02/23/2018   HGBA1C 7.9 (H) 11/18/2017   HGBA1C 9.4 05/23/2017     Lipid Panel ( most recent) Lipid Panel     Component Value Date/Time   CHOL 137 11/18/2017 0823   TRIG 469 (H) 11/18/2017 0823   HDL 28 (L) 11/18/2017 0823   CHOLHDL 6.1 04/28/2014 0121   VLDL 61 (H) 04/28/2014 0121   LDLCALC Comment 11/18/2017 0823     Assessment & Plan:   1. DM type 2 causing vascular disease ([place])  - Patient has currently uncontrolled symptomatic type 2 DM since  57 years of age. - He came with  improved glycemic profile on Novolin 70/30. His most recent labs show A1c of 7.6%, progressively improving from 9.4%.    -his diabetes is complicated by coronary artery disease, CVA, obesity/sedentary life, chronic heavy smoking and SHISHIR KRANTZ remains at a high risk for more acute and chronic complications which include CAD, CVA, CKD, retinopathy, and neuropathy. These are all discussed in detail with the patient.  - I have counseled him on diet management and weight loss, by adopting a carbohydrate restricted/protein rich diet.  -  Suggestion is made for him to avoid simple carbohydrates  from his diet including Cakes, Sweet Desserts / Pastries, Ice Cream, Soda (diet and  regular), Sweet Tea, Candies, Chips, Cookies, Store Bought Juices, Alcohol in Excess of  1-2 drinks a day, Artificial Sweeteners, and "Sugar-free" Products. This will help patient to have stable blood glucose profile and potentially avoid unintended weight gain.   - I encouraged him to switch to  unprocessed or minimally processed complex starch and increased protein intake (animal or plant source), fruits, and vegetables.  - he is advised to stick to a routine mealtimes to eat 3 meals  a day and avoid unnecessary snacks ( to snack only to correct hypoglycemia).   - I have approached him with the following individualized plan to manage diabetes and patient agrees:   -  He is doing fairly well on premixed insulin.  - For simplicity and cost reasons, I decided to keep him on premixed insulin Novolin 70/30, continue on the same dose of 40 units at breakfast and 30 units at supper  for pre-meal readings above 90 mg/dL.   - I I'll proceed with Januvia  25 mg by mouth daily with breakfast, due to risk of pancreatitis given severe hypertriglyceridemia.  - He reports intolerance to metformin causing GI side effects.  -Patient is encouraged to call clinic for blood glucose levels less than 70 or above 300 mg /dl.  - Patient specific target  A1c;  LDL, HDL, Triglycerides, and  Waist Circumference were discussed in detail.  2) BP/HTN:   His blood pressure is not controlled to target.  He continues to smoke heavily.    I advised him to continue his current blood pressure medications consistently including  ACEI/ARB. 3) Lipids/HPL: his lipid panel is not controlled  with severe hypertriglyceridemia   of  469.    Patient is advised to continue statins.  4)  Weight/Diet: CDE Consult has been  initiated , exercise, and detailed carbohydrates information provided.  5) Chronic Care/Health Maintenance:  -he  is on ACEI/ARB and Statin medications and  is encouraged to continue to follow up with Ophthalmology,  Dentist,  Podiatrist at least yearly or according to recommendations, and advised to  quit smoking. I have recommended yearly flu vaccine and pneumonia vaccination at least every 5 years; moderate intensity exercise for up to 150 minutes weekly; and  sleep for at least 7 hours a day.  - I advised patient to maintain close follow up with Sharilyn Sites, MD for primary care needs.  - Time spent with the patient: 25 min, of which >50% was spent in reviewing his blood glucose logs , discussing his hypo- and hyper-glycemic episodes, reviewing his current and  previous labs and insulin doses and developing a plan to avoid hypo- and hyper-glycemia. Please refer to Patient Instructions for Blood Glucose Monitoring and Insulin/Medications Dosing Guide"  in media tab for additional information. [name] participated in the discussions, expressed understanding, and voiced agreement with the above plans.  All questions were answered to his satisfaction. he is encouraged to contact clinic should he have any questions or concerns prior to his return visit.  Follow up plan: - Return in about 3 months (around 06/01/2018) for meter, and logs.  - Time spent with the patient: 25 min, of which >50% was spent in reviewing his blood glucose logs , discussing his hypo- and hyper-glycemic episodes, reviewing his current and  previous labs and insulin doses and developing a plan to avoid hypo- and hyper-glycemia. Please refer to Patient Instructions for Blood Glucose Monitoring and Insulin/Medications Dosing Guide"  in media tab for additional information.  Glade Lloyd, MD Phone: [phone]  Fax: [phone]   03/01/2018, 5:00 PM   This note was partially dictated with voice recognition software. Similar sounding words can be transcribed inadequately or may not  be corrected upon review.

## 2018-03-08 ENCOUNTER — Telehealth: Payer: Self-pay

## 2018-03-08 ENCOUNTER — Other Ambulatory Visit: Payer: Self-pay | Admitting: "Endocrinology

## 2018-03-08 NOTE — Telephone Encounter (Signed)
His other choices are more expensive . We can try Humulin 70/30.

## 2018-03-08 NOTE — Telephone Encounter (Signed)
Pt agree but states that he will call us back before he runs out. He states he still has quite a few kwikpens left.

## 2018-03-08 NOTE — Telephone Encounter (Signed)
Pt states that his Novolin 70/30 has a copay of $400. He is asking for a change.

## 2018-03-09 ENCOUNTER — Other Ambulatory Visit (INDEPENDENT_AMBULATORY_CARE_PROVIDER_SITE_OTHER): Payer: Self-pay | Admitting: Otolaryngology

## 2018-03-10 ENCOUNTER — Other Ambulatory Visit: Payer: Self-pay | Admitting: Cardiology

## 2018-03-29 ENCOUNTER — Encounter: Payer: Self-pay | Admitting: Gastroenterology

## 2018-03-30 ENCOUNTER — Other Ambulatory Visit: Payer: Self-pay | Admitting: Cardiology

## 2018-04-17 DIAGNOSIS — G2581 Restless legs syndrome: Secondary | ICD-10-CM | POA: Diagnosis not present

## 2018-04-17 DIAGNOSIS — G4733 Obstructive sleep apnea (adult) (pediatric): Secondary | ICD-10-CM | POA: Diagnosis not present

## 2018-04-25 DIAGNOSIS — I639 Cerebral infarction, unspecified: Secondary | ICD-10-CM | POA: Diagnosis not present

## 2018-04-25 DIAGNOSIS — I2510 Atherosclerotic heart disease of native coronary artery without angina pectoris: Secondary | ICD-10-CM | POA: Diagnosis not present

## 2018-04-25 DIAGNOSIS — Z6841 Body Mass Index (BMI) 40.0 and over, adult: Secondary | ICD-10-CM | POA: Diagnosis not present

## 2018-04-25 DIAGNOSIS — I200 Unstable angina: Secondary | ICD-10-CM | POA: Diagnosis not present

## 2018-04-25 DIAGNOSIS — J449 Chronic obstructive pulmonary disease, unspecified: Secondary | ICD-10-CM | POA: Diagnosis not present

## 2018-04-25 DIAGNOSIS — Z1389 Encounter for screening for other disorder: Secondary | ICD-10-CM | POA: Diagnosis not present

## 2018-04-25 DIAGNOSIS — I25700 Atherosclerosis of coronary artery bypass graft(s), unspecified, with unstable angina pectoris: Secondary | ICD-10-CM | POA: Diagnosis not present

## 2018-04-25 DIAGNOSIS — E1165 Type 2 diabetes mellitus with hyperglycemia: Secondary | ICD-10-CM | POA: Diagnosis not present

## 2018-04-25 DIAGNOSIS — Z719 Counseling, unspecified: Secondary | ICD-10-CM | POA: Diagnosis not present

## 2018-05-01 ENCOUNTER — Other Ambulatory Visit (INDEPENDENT_AMBULATORY_CARE_PROVIDER_SITE_OTHER): Payer: Self-pay | Admitting: Otolaryngology

## 2018-05-02 ENCOUNTER — Other Ambulatory Visit: Payer: Self-pay | Admitting: "Endocrinology

## 2018-05-11 ENCOUNTER — Other Ambulatory Visit: Payer: Self-pay

## 2018-05-11 MED ORDER — INSULIN ISOPHANE & REGULAR (HUMAN 70-30)100 UNIT/ML KWIKPEN: PEN_INJECTOR | SUBCUTANEOUS | 0 refills | Status: DC

## 2018-05-15 ENCOUNTER — Other Ambulatory Visit: Payer: Self-pay

## 2018-05-15 ENCOUNTER — Other Ambulatory Visit: Payer: Self-pay | Admitting: "Endocrinology

## 2018-05-15 DIAGNOSIS — E1159 Type 2 diabetes mellitus with other circulatory complications: Secondary | ICD-10-CM | POA: Diagnosis not present

## 2018-05-15 DIAGNOSIS — E119 Type 2 diabetes mellitus without complications: Secondary | ICD-10-CM | POA: Diagnosis not present

## 2018-05-15 MED ORDER — INSULIN ISOPHANE & REGULAR (HUMAN 70-30)100 UNIT/ML KWIKPEN: 50.0000 [IU] | PEN_INJECTOR | Freq: Two times a day (BID) | SUBCUTANEOUS | 0 refills | Status: DC

## 2018-05-15 MED ORDER — INSULIN ISOPHANE & REGULAR (HUMAN 70-30)100 UNIT/ML KWIKPEN: 40.0000 [IU] | PEN_INJECTOR | Freq: Two times a day (BID) | SUBCUTANEOUS | 2 refills | Status: DC

## 2018-05-16 ENCOUNTER — Other Ambulatory Visit: Payer: Self-pay | Admitting: "Endocrinology

## 2018-05-16 LAB — COMPREHENSIVE METABOLIC PANEL
A/G RATIO: 1.4 (ref 1.2–2.2)
ALT: 84 IU/L — AB (ref 0–44)
AST: 53 IU/L — ABNORMAL HIGH (ref 0–40)
Albumin: 4 g/dL (ref 3.5–5.5)
Alkaline Phosphatase: 113 IU/L (ref 39–117)
BUN/Creatinine Ratio: 10 (ref 9–20)
BUN: 8 mg/dL (ref 6–24)
Bilirubin Total: 0.5 mg/dL (ref 0.0–1.2)
CO2: 21 mmol/L (ref 20–29)
Calcium: 9.4 mg/dL (ref 8.7–10.2)
Chloride: 100 mmol/L (ref 96–106)
Creatinine, Ser: 0.79 mg/dL (ref 0.76–1.27)
GFR calc non Af Amer: 101 mL/min/{1.73_m2} (ref >59)
GFR, EST AFRICAN AMERICAN: 117 mL/min/{1.73_m2} (ref >59)
Globulin, Total: 2.9 g/dL (ref 1.5–4.5)
Glucose: 329 mg/dL — ABNORMAL HIGH (ref 65–99)
POTASSIUM: 4.4 mmol/L (ref 3.5–5.2)
Sodium: 138 mmol/L (ref 134–144)
Total Protein: 6.9 g/dL (ref 6.0–8.5)

## 2018-05-16 LAB — SPECIMEN STATUS REPORT

## 2018-05-16 LAB — HGB A1C W/O EAG: Hgb A1c MFr Bld: 10.4 % — ABNORMAL HIGH (ref 4.8–5.6)

## 2018-05-23 DIAGNOSIS — E662 Morbid (severe) obesity with alveolar hypoventilation: Secondary | ICD-10-CM | POA: Diagnosis not present

## 2018-05-23 DIAGNOSIS — G4714 Hypersomnia due to medical condition: Secondary | ICD-10-CM | POA: Diagnosis not present

## 2018-05-23 DIAGNOSIS — Z79899 Other long term (current) drug therapy: Secondary | ICD-10-CM | POA: Diagnosis not present

## 2018-05-23 DIAGNOSIS — G4733 Obstructive sleep apnea (adult) (pediatric): Secondary | ICD-10-CM | POA: Diagnosis not present

## 2018-06-05 ENCOUNTER — Encounter: Payer: Self-pay | Admitting: "Endocrinology

## 2018-06-05 ENCOUNTER — Ambulatory Visit (INDEPENDENT_AMBULATORY_CARE_PROVIDER_SITE_OTHER): Payer: Medicare HMO | Admitting: "Endocrinology

## 2018-06-05 VITALS — BP 129/84 | HR 75 | Ht 70.0 in | Wt 299.0 lb

## 2018-06-05 DIAGNOSIS — I10 Essential (primary) hypertension: Secondary | ICD-10-CM

## 2018-06-05 DIAGNOSIS — E1159 Type 2 diabetes mellitus with other circulatory complications: Secondary | ICD-10-CM | POA: Diagnosis not present

## 2018-06-05 DIAGNOSIS — F17200 Nicotine dependence, unspecified, uncomplicated: Secondary | ICD-10-CM

## 2018-06-05 DIAGNOSIS — Z9119 Patient's noncompliance with other medical treatment and regimen: Secondary | ICD-10-CM | POA: Diagnosis not present

## 2018-06-05 DIAGNOSIS — Z91199 Patient's noncompliance with other medical treatment and regimen due to unspecified reason: Secondary | ICD-10-CM | POA: Insufficient documentation

## 2018-06-05 DIAGNOSIS — E782 Mixed hyperlipidemia: Secondary | ICD-10-CM

## 2018-06-05 MED ORDER — INSULIN ISOPHANE & REGULAR (HUMAN 70-30)100 UNIT/ML KWIKPEN: 30.0000 [IU] | PEN_INJECTOR | Freq: Two times a day (BID) | SUBCUTANEOUS | 2 refills | Status: DC

## 2018-06-05 MED ORDER — SITAGLIPTIN PHOSPHATE 50 MG PO TABS: 50.0000 mg | ORAL_TABLET | Freq: Every day | ORAL | 2 refills | Status: DC

## 2018-06-05 NOTE — Patient Instructions (Signed)

## 2018-06-05 NOTE — Progress Notes (Signed)
Subjective:    Patient ID: [name], male    DOB: 24-Jun-1961.  he is being seen in follow-up for management of currently uncontrolled symptomatic diabetes requested by  Sharilyn Sites, MD.   Past Medical History:  Diagnosis Date  . Abnormal myocardial perfusion study 01/01/2011   there a small to moderate sized inferobasal scar  . Barrett's esophagus   . Cataracts, bilateral   . Chronic kidney disease    hx of kidney stones  . Claudication ([place]) 11/16/2011   PV test perform shows normal  . COPD (chronic obstructive pulmonary disease) ([place])   . Depression   . Diabetes ([place])    type 2 diabetes mellitus  . Dysrhythmia   . GERD (gastroesophageal reflux disease)   . Glaucoma   . Hernia of abdominal wall   . HTN (hypertension)   . Hyperlipidemia   . Morbid obesity ([place])   . Myocardial infarction ([place]) 2008,2009,2009  . OSA (obstructive sleep apnea)    on cpap  . PAF (paroxysmal atrial fibrillation) ([place])   . S/P colonoscopy 2009   3-4 mm transverse colon erosions likely secondary to  ASA  . S/P endoscopy Dec 2011   moderate erosive gastritis, Barrett's esophagus 1-2cm  . Sleep apnea   . SOB (shortness of breath) 11/03/2007   2D Echo EF 50%-55%  . Stroke [facility])    Past Surgical History:  Procedure Laterality Date  . BIOPSY N/A 04/29/2015   Procedure: BIOPSY;  Surgeon: Danie Binder, MD;  Location: [facility];  Service: Endoscopy;  Laterality: N/A;  . CABG X 4  03/2008  . CARDIAC CATHETERIZATION  2009   stent placement to the left circumflex a 2.25   . CARDIAC CATHETERIZATION  07/08/2010  . COLONOSCOPY WITH PROPOFOL N/A 09/27/2017   Procedure: COLONOSCOPY WITH PROPOFOL;  Surgeon: Danie Binder, MD;  Location: [facility];  Service: Endoscopy;  Laterality: N/A;  12:00pm  . CORONARY ARTERY BYPASS GRAFT  2008   4 vessels  . CORONARY STENT PLACEMENT  12/29/12  . CORONARY STENT PLACEMENT  12/2012  . ESOPHAGEAL DILATION N/A 04/29/2015   Procedure: ESOPHAGEAL DILATION  15 mm, 16 mm;  Surgeon: Danie Binder, MD;  Location: [facility];  Service: Endoscopy;  Laterality: N/A;  . ESOPHAGOGASTRODUODENOSCOPY  09/2011   Barrett's esophagus, no dysplasia on biopsies. Distal esophagitis. Status post dilation. Moderate gastritis and duodenitis, but biopsies benign. Next EGD in November 2015 for surveillance of Barrett's esophagus.  . ESOPHAGOGASTRODUODENOSCOPY (EGD) WITH PROPOFOL N/A 04/29/2015   SLF: 1. Barretts esophagus 2. Moderate non-erosive gastritis.   Marland Kitchen HERNIA REPAIR     ventral hernia repair  . LEFT HEART CATHETERIZATION WITH CORONARY/GRAFT ANGIOGRAM N/A 12/29/2012   Procedure: LEFT HEART CATHETERIZATION WITH Beatrix Fetters;  Surgeon: Troy Sine, MD;  [facility];  Service: Cardiovascular;  Laterality: N/A;  . ORIF FIBULA FRACTURE Right 06/03/2015   Procedure: OPEN REDUCTION INTERNAL FIXATION (ORIF) DISTAL FIBULA  FRACTURE;  Surgeon: Garald Balding, MD;  Location: [place];  Service: Orthopedics;  Laterality: Right;  . PERCUTANEOUS CORONARY STENT INTERVENTION (PCI-S)  12/29/2012   Procedure: PERCUTANEOUS CORONARY STENT INTERVENTION (PCI-S);  Surgeon: Troy Sine, MD;  Location: [facility] CATH LAB;  Service: Cardiovascular;;  . POLYPECTOMY  09/27/2017   Procedure: POLYPECTOMY;  Surgeon: Danie Binder, MD;  Location: [facility];  Service: Endoscopy;;  cecal polyp, ascending polyps x6, transverse colon polyps x6, splenic flexure polyps x2, descending colon polyps x6, sigmoid  colon polyp x1, rectal polyp x1   . SAVORY DILATION  09/06/2011   Procedure: SAVORY DILATION;  Surgeon: Dorothyann Peng, MD;  Location: [facility];  Service: Endoscopy;  Laterality: N/A;  Dilated with 75m   Social History   Socioeconomic History  . Marital status: Divorced    Spouse name: Not on file  . Number of children: Not on file  . Years of education: Not on file  . Highest education level: Not on file  Occupational History  . Not on file  Social Needs  . Financial  resource strain: Not on file  . Food insecurity:    Worry: Not on file    Inability: Not on file  . Transportation needs:    Medical: Not on file    Non-medical: Not on file  Tobacco Use  . Smoking status: Current Every Day Smoker    Packs/day: 0.50    Years: 30.00    Pack years: 15.00    Types: Cigarettes  . Smokeless tobacco: Never Used  Substance and Sexual Activity  . Alcohol use: Yes    Comment: occ  . Drug use: No  . Sexual activity: Never  Lifestyle  . Physical activity:    Days per week: Not on file    Minutes per session: Not on file  . Stress: Not on file  Relationships  . Social connections:    Talks on phone: Not on file    Gets together: Not on file    Attends religious service: Not on file    Active member of club or organization: Not on file    Attends meetings of clubs or organizations: Not on file    Relationship status: Not on file  Other Topics Concern  . Not on file  Social History Narrative  . Not on file   Outpatient Encounter Medications as of 06/05/2018  Medication Sig  . amLODipine (NORVASC) 10 MG tablet Take 1 tablet (10 mg total) by mouth daily.  .Marland KitchenamLODipine (NORVASC) 5 MG tablet TAKE 1 TABLET BY MOUTH EVERY DAY  . apixaban (ELIQUIS) 5 MG TABS tablet Take 1 tablet (5 mg total) by mouth 2 (two) times daily.  . Blood Glucose Monitoring Suppl (ACCU-CHEK GUIDE) w/Device KIT 1 each by Does not apply route 4 (four) times daily.  . Choline Fenofibrate (TRILIPIX) 135 MG capsule Take 135 mg by mouth at bedtime.   . diazepam (VALIUM) 5 MG tablet Take 5 mg by mouth 2 (two) times daily.  .Marland Kitchenescitalopram (LEXAPRO) 20 MG tablet Take 20 mg daily by mouth.   . Fluticasone-Umeclidin-Vilant (TRELEGY ELLIPTA) 100-62.5-25 MCG/INH AEPB Inhale 1 puff daily into the lungs.   . furosemide (LASIX) 80 MG tablet Take 80 mg by mouth daily.  .Marland Kitchenglucose blood (ACCU-CHEK GUIDE) test strip Use as instructed 4 x daily. E11.65  . Insulin Isophane & Regular Human (HUMULIN 70/30  MIX) (70-30) 100 UNIT/ML PEN Inject 30 Units into the skin 2 (two) times daily before a meal.  . Insulin Pen Needle (PEN NEEDLES) 32G X 6 MM MISC 1 each by Does not apply route 4 (four) times daily.  . isosorbide mononitrate (IMDUR) 60 MG 24 hr tablet Take 1 tablet (60 mg total) by mouth 3 (three) times daily. Please make an appointment for additional refills (Patient taking differently: Take 60 mg by mouth 2 (two) times daily. )  . LANCETS ULTRA FINE MISC 1 each by Does not apply route 4 (four) times daily.  .Marland Kitchenlisinopril (PRINIVIL,ZESTRIL)  5 MG tablet Take 5 mg by mouth daily.  . meclizine (ANTIVERT) 25 MG tablet Take 25 mg by mouth 2 (two) times daily as needed for dizziness.  . metoprolol tartrate (LOPRESSOR) 100 MG tablet TAKE 1 TABLET BY MOUTH TWICE A DAY  . modafinil (PROVIGIL) 100 MG tablet Take 100 mg daily by mouth.   . modafinil (PROVIGIL) 100 MG tablet Take by mouth 2 (two) times daily.  . nitroGLYCERIN (NITROLINGUAL) 0.4 MG/SPRAY spray Place 1 spray under the tongue every 5 (five) minutes x 3 doses as needed for chest pain.  Glory Rosebush DELICA LANCETS FINE MISC 1 each by Does not apply route 4 (four) times daily. e11.65  . pantoprazole (PROTONIX) 40 MG tablet TAKE 1 TABLET BY MOUTH 30 MINUTES BEFORE BREAKFAST (Patient taking differently: TAKE 40 MG BY MOUTH 30 MINUTES BEFORE BREAKFAST)  . potassium chloride SA (K-DUR,KLOR-CON) 20 MEQ tablet Take 1 tablet (20 mEq total) by mouth 2 (two) times daily.  . ranitidine (ZANTAC) 150 MG capsule Take 150 mg at bedtime by mouth.   . sitaGLIPtin (JANUVIA) 50 MG tablet Take 1 tablet (50 mg total) by mouth daily.  . Vitamin D, Ergocalciferol, (DRISDOL) 50000 units CAPS capsule TAKE 1 CAPSULE BY MOUTH ONE TIME PER WEEK  . [DISCONTINUED] Insulin Isophane & Regular Human (HUMULIN 70/30 MIX) (70-30) 100 UNIT/ML PEN Inject 40-50 Units into the skin 2 (two) times daily.  . [DISCONTINUED] Insulin Isophane & Regular Human (HUMULIN 70/30 MIX) (70-30) 100  UNIT/ML PEN Inject 40-50 Units into the skin 2 (two) times daily. INJECT 40 UNITS WITH BREAKFAST AND 30 UNITS WITH SUPPER WHEN PRE-MEAL BLOOD GLUCOSE IS ABOVE 90  . [DISCONTINUED] Insulin Isophane & Regular Human (NOVOLIN 70/30 FLEXPEN) (70-30) 100 UNIT/ML PEN Inject 40-50 Units into the skin 2 (two) times daily.  . [DISCONTINUED] Insulin Pen Needle (B-D ULTRAFINE III SHORT PEN) 31G X 8 MM MISC 1 each by Does not apply route as directed. (Patient not taking: Reported on 09/12/2017)  . [DISCONTINUED] JANUVIA 100 MG tablet Take 50 mg by mouth daily.    No facility-administered encounter medications on file as of 06/05/2018.     ALLERGIES: Allergies  Allergen Reactions  . Contrast Media [Iodinated Diagnostic Agents] Other (See Comments)    Pt must be premedicated before given contrast media - stops heart  . Iohexol Other (See Comments)     Consult with radiologist before pre meds are given.Desc: PT. STATES HEART STOPPED HAS TO BE PREMED.     VACCINATION STATUS:  There is no immunization history on file for this patient.  Diabetes  He presents for his follow-up diabetic visit. He has type 2 diabetes mellitus. Onset time: He was diagnosed at approximate age of 89 years. His disease course has been worsening. There are no hypoglycemic associated symptoms. Pertinent negatives for hypoglycemia include no confusion, headaches, pallor or seizures. Associated symptoms include polydipsia and polyuria. Pertinent negatives for diabetes include no blurred vision, no chest pain, no fatigue, no polyphagia and no weakness. There are no hypoglycemic complications. Symptoms are worsening. Diabetic complications include a CVA and heart disease. Risk factors for coronary artery disease include diabetes mellitus, dyslipidemia, hypertension, male sex, obesity, tobacco exposure, sedentary lifestyle and family history. Current diabetic treatments: He was supposed to be on basal insulin, unfortunately he ran out and could  not afford refills and he did not call clinic for alternatives. He is compliant with treatment none of the time. His weight is fluctuating minimally. He is following a generally  unhealthy diet. When asked about meal planning, he reported none. He has not had a previous visit with a dietitian. He never participates in exercise. His home blood glucose trend is decreasing steadily. (She comes in with no meter nor logs to review.  His A1c is 10.4% increasing from  7.6%.) An ACE inhibitor/angiotensin II receptor blocker is being taken. He does not see a podiatrist.Eye exam is not current.  Hyperlipidemia  This is a chronic problem. The current episode started more than 1 year ago. The problem is uncontrolled. Recent lipid tests were reviewed and are high. Exacerbating diseases include diabetes and obesity. Pertinent negatives include no chest pain, myalgias or shortness of breath. Current antihyperlipidemic treatment includes statins. Compliance problems include medication cost and psychosocial issues.  Risk factors for coronary artery disease include dyslipidemia, diabetes mellitus, hypertension, male sex, obesity, family history and a sedentary lifestyle.  Hypertension  This is a chronic problem. The current episode started more than 1 year ago. The problem is uncontrolled. Pertinent negatives include no blurred vision, chest pain, headaches, neck pain, palpitations or shortness of breath. Risk factors for coronary artery disease include diabetes mellitus, dyslipidemia, male gender, obesity, family history, sedentary lifestyle and smoking/tobacco exposure. Past treatments include ACE inhibitors. Hypertensive end-organ damage includes CVA.    Review of Systems  Constitutional: Negative for chills, fatigue, fever and unexpected weight change.  HENT: Negative for dental problem, mouth sores and trouble swallowing.   Eyes: Negative for blurred vision and visual disturbance.  Respiratory: Negative for cough,  choking, chest tightness, shortness of breath and wheezing.   Cardiovascular: Negative for chest pain, palpitations and leg swelling.  Gastrointestinal: Negative for abdominal distention, abdominal pain, constipation, diarrhea, nausea and vomiting.  Endocrine: Positive for polydipsia and polyuria. Negative for polyphagia.  Genitourinary: Negative for dysuria, flank pain, hematuria and urgency.  Musculoskeletal: Negative for back pain, gait problem, myalgias and neck pain.  Skin: Negative for pallor, rash and wound.  Neurological: Negative for seizures, syncope, weakness, numbness and headaches.  Psychiatric/Behavioral: Negative.  Negative for confusion and dysphoric mood.    Objective:    BP 129/84   Pulse 75   Ht '5\' 10"'  (1.778 m)   Wt 299 lb (135.6 kg)   BMI 42.90 kg/m   Wt Readings from Last 3 Encounters:  06/05/18 299 lb (135.6 kg)  03/01/18 (!) 303 lb (137.4 kg)  11/27/17 298 lb (135.2 kg)     Physical Exam  Constitutional: He is oriented to person, place, and time. He appears well-developed. He is cooperative. No distress.  Poor personal hygiene.  HENT:  Head: Normocephalic and atraumatic.  Eyes: EOM are normal.  Neck: Normal range of motion. Neck supple. No tracheal deviation present. No thyromegaly present.  Cardiovascular: Normal rate, S1 normal and S2 normal. Exam reveals no gallop.  No murmur heard. Pulses:      Dorsalis pedis pulses are 1+ on the right side, and 1+ on the left side.       Posterior tibial pulses are 1+ on the right side, and 1+ on the left side.  Pulmonary/Chest: Effort normal. No respiratory distress. He has no wheezes.  Abdominal: Bowel sounds are normal. He exhibits no distension. There is no tenderness. There is no guarding and no CVA tenderness.  Musculoskeletal: He exhibits no edema.       Right shoulder: He exhibits no swelling and no deformity.  Neurological: He is alert and oriented to person, place, and time. He has normal strength and  normal  reflexes. No cranial nerve deficit or sensory deficit. Gait normal.  Skin: Skin is warm and dry. No rash noted. No cyanosis. Nails show no clubbing.  Psychiatric: His speech is normal. Cognition and memory are normal.  Reluctant affect. Unconcerned attitude.   CMP ( most recent) CMP     Component Value Date/Time   NA 138 05/15/2018 1118   K 4.4 05/15/2018 1118   CL 100 05/15/2018 1118   CO2 21 05/15/2018 1118   GLUCOSE 329 (H) 05/15/2018 1118   GLUCOSE 133 (H) 11/27/2017 1511   BUN 8 05/15/2018 1118   CREATININE 0.79 05/15/2018 1118   CALCIUM 9.4 05/15/2018 1118   PROT 6.9 05/15/2018 1118   ALBUMIN 4.0 05/15/2018 1118   AST 53 (H) 05/15/2018 1118   ALT 84 (H) 05/15/2018 1118   ALKPHOS 113 05/15/2018 1118   BILITOT 0.5 05/15/2018 1118   GFRNONAA 101 05/15/2018 1118   GFRAA 117 05/15/2018 1118   Diabetic Labs (most recent): Lab Results  Component Value Date   HGBA1C 10.4 (H) 05/15/2018   HGBA1C 7.6 (H) 02/23/2018   HGBA1C 7.9 (H) 11/18/2017     Lipid Panel ( most recent) Lipid Panel     Component Value Date/Time   CHOL 137 11/18/2017 0823   TRIG 469 (H) 11/18/2017 0823   HDL 28 (L) 11/18/2017 0823   CHOLHDL 6.1 04/28/2014 0121   VLDL 61 (H) 04/28/2014 0121   LDLCALC Comment 11/18/2017 0823     Assessment & Plan:   1. DM type 2 causing vascular disease ([place])  - Patient has currently uncontrolled symptomatic type 2 DM since  57 years of age. -Unfortunately, patient disengaged from self-care.  [place] with no meter nor logs.  A1c is higher at 10.4% increasing from 7.6%.   -his diabetes is complicated by coronary artery disease, CVA, obesity/sedentary life, chronic heavy smoking and [name] remains at extremely high risk  for more acute and chronic complications which include CAD, CVA, CKD, retinopathy, and neuropathy. These are all discussed in detail with the patient.  - I have counseled him on diet management and weight loss, by adopting a  carbohydrate restricted/protein rich diet.  -  Suggestion is made for him to avoid simple carbohydrates  from his diet including Cakes, Sweet Desserts / Pastries, Ice Cream, Soda (diet and regular), Sweet Tea, Candies, Chips, Cookies, Store Bought Juices, Alcohol in Excess of  1-2 drinks a day, Artificial Sweeteners, and "Sugar-free" Products. This will help patient to have stable blood glucose profile and potentially avoid unintended weight gain.  - I encouraged him to switch to  unprocessed or minimally processed complex starch and increased protein intake (animal or plant source), fruits, and vegetables.  - he is advised to stick to a routine mealtimes to eat 3 meals  a day and avoid unnecessary snacks ( to snack only to correct hypoglycemia).   - I have approached him with the following individualized plan to manage diabetes and patient agrees:   - He has significant cognitive deficit, for simplicity reasons he will be kept on premixed insulin.  - Besides, he could not afford the co-pays for insulin analogs.  -I approached him to resume Novolin 70/30, 30 units with breakfast and 30 units with supper  for pre-meal glucose readings above 90 mg/dL.   -  I'll increase his Januvia to 50  mg by mouth daily with breakfast. -  He reports intolerance to metformin causing GI side effects.  -Patient is encouraged to call  clinic for blood glucose levels less than 70 or above 300 mg /dl.  - Patient specific target  A1c;  LDL, HDL, Triglycerides, and  Waist Circumference were discussed in detail.  2) BP/HTN:   His blood pressure is controlled to target.   He continues to smoke heavily-counseled extensively against smoking.    I advised him to continue his current blood pressure medications consistently including lisinopril 5 mg p.o. daily.   3) Lipids/HPL: his lipid panel is not controlled  with severe hypertriglyceridemia   of  469.  He is advised to continue Trilipix 135 mg p.o. nightly.    4)   Weight/Diet: CDE Consult has been  initiated , exercise, and detailed carbohydrates information provided.  5) Chronic Care/Health Maintenance:  -he  is on ACEI/ARB and Statin medications and  is encouraged to continue to follow up with Ophthalmology, Dentist,  Podiatrist at least yearly or according to recommendations, and advised to  quit smoking. I have recommended yearly flu vaccine and pneumonia vaccination at least every 5 years; moderate intensity exercise for up to 150 minutes weekly; and  sleep for at least 7 hours a day.  - I advised patient to maintain close follow up with Sharilyn Sites, MD for primary care needs.  - Time spent with the patient: 25 min, of which >50% was spent in reviewing his blood glucose logs , discussing his hypo- and hyper-glycemic episodes, reviewing his current and  previous labs and insulin doses and developing a plan to avoid hypo- and hyper-glycemia. Please refer to Patient Instructions for Blood Glucose Monitoring and Insulin/Medications Dosing Guide"  in media tab for additional information. [name] participated in the discussions, expressed understanding, and voiced agreement with the above plans.  All questions were answered to his satisfaction. he is encouraged to contact clinic should he have any questions or concerns prior to his return visit.  Follow up plan: - Return in about 2 weeks (around 06/19/2018) for Follow up with Meter and Logs Only - no Labs.  - Time spent with the patient: 25 min, of which >50% was spent in reviewing his blood glucose logs , discussing his hypo- and hyper-glycemic episodes, reviewing his current and  previous labs and insulin doses and developing a plan to avoid hypo- and hyper-glycemia. Please refer to Patient Instructions for Blood Glucose Monitoring and Insulin/Medications Dosing Guide"  in media tab for additional information.  Glade Lloyd, MD Phone: [phone]  Fax: [phone]   06/05/2018, 2:24 PM   This  note was partially dictated with voice recognition software. Similar sounding words can be transcribed inadequately or may not  be corrected upon review.

## 2018-06-19 ENCOUNTER — Ambulatory Visit (INDEPENDENT_AMBULATORY_CARE_PROVIDER_SITE_OTHER): Payer: Medicare HMO | Admitting: "Endocrinology

## 2018-06-19 ENCOUNTER — Encounter: Payer: Self-pay | Admitting: "Endocrinology

## 2018-06-19 VITALS — BP 142/91 | HR 99 | Ht 70.0 in | Wt 292.0 lb

## 2018-06-19 DIAGNOSIS — E1159 Type 2 diabetes mellitus with other circulatory complications: Secondary | ICD-10-CM

## 2018-06-19 DIAGNOSIS — E782 Mixed hyperlipidemia: Secondary | ICD-10-CM | POA: Diagnosis not present

## 2018-06-19 DIAGNOSIS — F17200 Nicotine dependence, unspecified, uncomplicated: Secondary | ICD-10-CM

## 2018-06-19 DIAGNOSIS — I10 Essential (primary) hypertension: Secondary | ICD-10-CM | POA: Diagnosis not present

## 2018-06-19 DIAGNOSIS — E1165 Type 2 diabetes mellitus with hyperglycemia: Secondary | ICD-10-CM | POA: Diagnosis not present

## 2018-06-19 MED ORDER — INSULIN ISOPHANE & REGULAR (HUMAN 70-30)100 UNIT/ML KWIKPEN: 50.0000 [IU] | PEN_INJECTOR | Freq: Two times a day (BID) | SUBCUTANEOUS | 2 refills | Status: DC

## 2018-06-19 NOTE — Patient Instructions (Signed)

## 2018-06-19 NOTE — Progress Notes (Signed)
Endocrinology follow-up note   Subjective:    Patient ID: [name], male    DOB: Mar 17, 1961.  he is being seen in follow-up for management of currently uncontrolled symptomatic diabetes requested by  Sharilyn Sites, MD.   Past Medical History:  Diagnosis Date  . Abnormal myocardial perfusion study 01/01/2011   there a small to moderate sized inferobasal scar  . Barrett's esophagus   . Cataracts, bilateral   . Chronic kidney disease    hx of kidney stones  . Claudication ([place]) 11/16/2011   PV test perform shows normal  . COPD (chronic obstructive pulmonary disease) ([place])   . Depression   . Diabetes ([place])    type 2 diabetes mellitus  . Dysrhythmia   . GERD (gastroesophageal reflux disease)   . Glaucoma   . Hernia of abdominal wall   . HTN (hypertension)   . Hyperlipidemia   . Morbid obesity ([place])   . Myocardial infarction ([place]) 2008,2009,2009  . OSA (obstructive sleep apnea)    on cpap  . PAF (paroxysmal atrial fibrillation) ([place])   . S/P colonoscopy 2009   3-4 mm transverse colon erosions likely secondary to  ASA  . S/P endoscopy Dec 2011   moderate erosive gastritis, Barrett's esophagus 1-2cm  . Sleep apnea   . SOB (shortness of breath) 11/03/2007   2D Echo EF 50%-55%  . Stroke [facility])    Past Surgical History:  Procedure Laterality Date  . BIOPSY N/A 04/29/2015   Procedure: BIOPSY;  Surgeon: Danie Binder, MD;  Location: [facility];  Service: Endoscopy;  Laterality: N/A;  . CABG X 4  03/2008  . CARDIAC CATHETERIZATION  2009   stent placement to the left circumflex a 2.25   . CARDIAC CATHETERIZATION  07/08/2010  . COLONOSCOPY WITH PROPOFOL N/A 09/27/2017   Procedure: COLONOSCOPY WITH PROPOFOL;  Surgeon: Danie Binder, MD;  Location: [facility];  Service: Endoscopy;  Laterality: N/A;  12:00pm  . CORONARY ARTERY BYPASS GRAFT  2008   4 vessels  . CORONARY STENT PLACEMENT  12/29/12  . CORONARY STENT PLACEMENT  12/2012  . ESOPHAGEAL DILATION N/A 04/29/2015    Procedure: ESOPHAGEAL DILATION 15 mm, 16 mm;  Surgeon: Danie Binder, MD;  Location: [facility];  Service: Endoscopy;  Laterality: N/A;  . ESOPHAGOGASTRODUODENOSCOPY  09/2011   Barrett's esophagus, no dysplasia on biopsies. Distal esophagitis. Status post dilation. Moderate gastritis and duodenitis, but biopsies benign. Next EGD in November 2015 for surveillance of Barrett's esophagus.  . ESOPHAGOGASTRODUODENOSCOPY (EGD) WITH PROPOFOL N/A 04/29/2015   SLF: 1. Barretts esophagus 2. Moderate non-erosive gastritis.   Marland Kitchen HERNIA REPAIR     ventral hernia repair  . LEFT HEART CATHETERIZATION WITH CORONARY/GRAFT ANGIOGRAM N/A 12/29/2012   Procedure: LEFT HEART CATHETERIZATION WITH Beatrix Fetters;  Surgeon: Troy Sine, MD;  Location: [facility] CATH LAB;  Service: Cardiovascular;  Laterality: N/A;  . ORIF FIBULA FRACTURE Right 06/03/2015   Procedure: OPEN REDUCTION INTERNAL FIXATION (ORIF) DISTAL FIBULA  FRACTURE;  Surgeon: Garald Balding, MD;  Location: [facility];  Service: Orthopedics;  Laterality: Right;  . PERCUTANEOUS CORONARY STENT INTERVENTION (PCI-S)  12/29/2012   Procedure: PERCUTANEOUS CORONARY STENT INTERVENTION (PCI-S);  Surgeon: Troy Sine, MD;  Location: [facility] CATH LAB;  Service: Cardiovascular;;  . POLYPECTOMY  09/27/2017   Procedure: POLYPECTOMY;  Surgeon: Danie Binder, MD;  [facility];  Service: Endoscopy;;  cecal polyp, ascending polyps x6, transverse colon polyps x6, splenic flexure polyps x2, descending colon  polyps x6, sigmoid  colon polyp x1, rectal polyp x1   . SAVORY DILATION  09/06/2011   Procedure: SAVORY DILATION;  Surgeon: Dorothyann Peng, MD;  Location: [facility];  Service: Endoscopy;  Laterality: N/A;  Dilated with 67m   Social History   Socioeconomic History  . Marital status: Divorced    Spouse name: Not on file  . Number of children: Not on file  . Years of education: Not on file  . Highest education level: Not on file  Occupational History  . Not on  file  Social Needs  . Financial resource strain: Not on file  . Food insecurity:    Worry: Not on file    Inability: Not on file  . Transportation needs:    Medical: Not on file    Non-medical: Not on file  Tobacco Use  . Smoking status: Current Every Day Smoker    Packs/day: 0.50    Years: 30.00    Pack years: 15.00    Types: Cigarettes  . Smokeless tobacco: Never Used  Substance and Sexual Activity  . Alcohol use: Yes    Comment: occ  . Drug use: No  . Sexual activity: Never  Lifestyle  . Physical activity:    Days per week: Not on file    Minutes per session: Not on file  . Stress: Not on file  Relationships  . Social connections:    Talks on phone: Not on file    Gets together: Not on file    Attends religious service: Not on file    Active member of club or organization: Not on file    Attends meetings of clubs or organizations: Not on file    Relationship status: Not on file  Other Topics Concern  . Not on file  Social History Narrative  . Not on file   Outpatient Encounter Medications as of 06/19/2018  Medication Sig  . amLODipine (NORVASC) 5 MG tablet TAKE 1 TABLET BY MOUTH EVERY DAY  . apixaban (ELIQUIS) 5 MG TABS tablet Take 1 tablet (5 mg total) by mouth 2 (two) times daily.  . Blood Glucose Monitoring Suppl (ACCU-CHEK GUIDE) w/Device KIT 1 each by Does not apply route 4 (four) times daily.  . Choline Fenofibrate (TRILIPIX) 135 MG capsule Take 135 mg by mouth at bedtime.   . diazepam (VALIUM) 5 MG tablet Take 5 mg by mouth 2 (two) times daily.  .Marland Kitchenescitalopram (LEXAPRO) 20 MG tablet Take 20 mg daily by mouth.   . Fluticasone-Umeclidin-Vilant (TRELEGY ELLIPTA) 100-62.5-25 MCG/INH AEPB Inhale 1 puff daily into the lungs.   . furosemide (LASIX) 80 MG tablet Take 80 mg by mouth daily.  .Marland Kitchenglucose blood (ACCU-CHEK GUIDE) test strip Use as instructed 4 x daily. E11.65  . Insulin Isophane & Regular Human (HUMULIN 70/30 MIX) (70-30) 100 UNIT/ML PEN Inject 50 Units  into the skin 2 (two) times daily before a meal.  . Insulin Pen Needle (PEN NEEDLES) 32G X 6 MM MISC 1 each by Does not apply route 4 (four) times daily.  . isosorbide mononitrate (IMDUR) 60 MG 24 hr tablet Take 1 tablet (60 mg total) by mouth 3 (three) times daily. Please make an appointment for additional refills (Patient taking differently: Take 60 mg by mouth 2 (two) times daily. )  . LANCETS ULTRA FINE MISC 1 each by Does not apply route 4 (four) times daily.  .Marland Kitchenlisinopril (PRINIVIL,ZESTRIL) 5 MG tablet Take 5 mg by mouth daily.  . meclizine (  ANTIVERT) 25 MG tablet Take 25 mg by mouth 2 (two) times daily as needed for dizziness.  . metoprolol tartrate (LOPRESSOR) 100 MG tablet TAKE 1 TABLET BY MOUTH TWICE A DAY  . modafinil (PROVIGIL) 100 MG tablet Take 100 mg daily by mouth.   . modafinil (PROVIGIL) 100 MG tablet Take by mouth 2 (two) times daily.  . nitroGLYCERIN (NITROLINGUAL) 0.4 MG/SPRAY spray Place 1 spray under the tongue every 5 (five) minutes x 3 doses as needed for chest pain.  Glory Rosebush DELICA LANCETS FINE MISC 1 each by Does not apply route 4 (four) times daily. e11.65  . pantoprazole (PROTONIX) 40 MG tablet TAKE 1 TABLET BY MOUTH 30 MINUTES BEFORE BREAKFAST (Patient taking differently: TAKE 40 MG BY MOUTH 30 MINUTES BEFORE BREAKFAST)  . potassium chloride SA (K-DUR,KLOR-CON) 20 MEQ tablet Take 1 tablet (20 mEq total) by mouth 2 (two) times daily.  . ranitidine (ZANTAC) 150 MG capsule Take 150 mg at bedtime by mouth.   . sitaGLIPtin (JANUVIA) 50 MG tablet Take 1 tablet (50 mg total) by mouth daily.  . Vitamin D, Ergocalciferol, (DRISDOL) 50000 units CAPS capsule TAKE 1 CAPSULE BY MOUTH ONE TIME PER WEEK  . [DISCONTINUED] Insulin Isophane & Regular Human (HUMULIN 70/30 MIX) (70-30) 100 UNIT/ML PEN Inject 30 Units into the skin 2 (two) times daily before a meal.  . [DISCONTINUED] Insulin Isophane & Regular Human (HUMULIN 70/30 MIX) (70-30) 100 UNIT/ML PEN Inject 50 Units into the  skin 2 (two) times daily before a meal.   No facility-administered encounter medications on file as of 06/19/2018.     ALLERGIES: Allergies  Allergen Reactions  . Contrast Media [Iodinated Diagnostic Agents] Other (See Comments)    Pt must be premedicated before given contrast media - stops heart  . Iohexol Other (See Comments)     Consult with radiologist before pre meds are given.Desc: PT. STATES HEART STOPPED HAS TO BE PREMED.     VACCINATION STATUS:  There is no immunization history on file for this patient.  Diabetes  He presents for his follow-up diabetic visit. He has type 2 diabetes mellitus. Onset time: He was diagnosed at approximate age of 68 years. His disease course has been worsening. There are no hypoglycemic associated symptoms. Pertinent negatives for hypoglycemia include no confusion, headaches, pallor or seizures. Associated symptoms include polydipsia and polyuria. Pertinent negatives for diabetes include no blurred vision, no chest pain, no fatigue, no polyphagia and no weakness. There are no hypoglycemic complications. Symptoms are worsening. Diabetic complications include a CVA and heart disease. Risk factors for coronary artery disease include diabetes mellitus, dyslipidemia, hypertension, male sex, obesity, tobacco exposure, sedentary lifestyle and family history. Current diabetic treatments: He was supposed to be on basal insulin, unfortunately he ran out and could not afford refills and he did not call clinic for alternatives. He is compliant with treatment none of the time. His weight is decreasing steadily. He is following a generally unhealthy diet. When asked about meal planning, he reported none. He has not had a previous visit with a dietitian. He never participates in exercise. His home blood glucose trend is increasing steadily. His breakfast blood glucose range is generally >200 mg/dl. His lunch blood glucose range is generally >200 mg/dl. His dinner blood  glucose range is generally >200 mg/dl. His bedtime blood glucose range is generally >200 mg/dl. His overall blood glucose range is >200 mg/dl. (She comes in with no meter nor logs to review.  His A1c is 10.4% increasing  from  7.6%.) An ACE inhibitor/angiotensin II receptor blocker is being taken. He does not see a podiatrist.Eye exam is not current.  Hyperlipidemia  This is a chronic problem. The current episode started more than 1 year ago. The problem is uncontrolled. Recent lipid tests were reviewed and are high. Exacerbating diseases include diabetes and obesity. Pertinent negatives include no chest pain, myalgias or shortness of breath. Current antihyperlipidemic treatment includes statins. Compliance problems include medication cost and psychosocial issues.  Risk factors for coronary artery disease include dyslipidemia, diabetes mellitus, hypertension, male sex, obesity, family history and a sedentary lifestyle.  Hypertension  This is a chronic problem. The current episode started more than 1 year ago. The problem is uncontrolled. Pertinent negatives include no blurred vision, chest pain, headaches, neck pain, palpitations or shortness of breath. Risk factors for coronary artery disease include diabetes mellitus, dyslipidemia, male gender, obesity, family history, sedentary lifestyle and smoking/tobacco exposure. Past treatments include ACE inhibitors. Hypertensive end-organ damage includes CVA.    Review of Systems  Constitutional: Negative for chills, fatigue, fever and unexpected weight change.  HENT: Negative for dental problem, mouth sores and trouble swallowing.   Eyes: Negative for blurred vision and visual disturbance.  Respiratory: Negative for cough, choking, chest tightness, shortness of breath and wheezing.   Cardiovascular: Negative for chest pain, palpitations and leg swelling.  Gastrointestinal: Negative for abdominal distention, abdominal pain, constipation, diarrhea, nausea and  vomiting.  Endocrine: Positive for polydipsia and polyuria. Negative for polyphagia.  Genitourinary: Negative for dysuria, flank pain, hematuria and urgency.  Musculoskeletal: Negative for back pain, gait problem, myalgias and neck pain.  Skin: Negative for pallor, rash and wound.  Neurological: Negative for seizures, syncope, weakness, numbness and headaches.  Psychiatric/Behavioral: Negative.  Negative for confusion and dysphoric mood.    Objective:    BP (!) 142/91   Pulse 99   Ht '5\' 10"'  (1.778 m)   Wt 292 lb (132.5 kg)   BMI 41.90 kg/m   Wt Readings from Last 3 Encounters:  06/19/18 292 lb (132.5 kg)  06/05/18 299 lb (135.6 kg)  03/01/18 (!) 303 lb (137.4 kg)     Physical Exam  Constitutional: He is oriented to person, place, and time. He appears well-developed. He is cooperative. No distress.  Poor personal hygiene.  HENT:  Head: Normocephalic and atraumatic.  Eyes: EOM are normal.  Neck: Normal range of motion. Neck supple. No tracheal deviation present. No thyromegaly present.  Cardiovascular: Normal rate, S1 normal and S2 normal. Exam reveals no gallop.  No murmur heard. Pulses:      Dorsalis pedis pulses are 1+ on the right side, and 1+ on the left side.       Posterior tibial pulses are 1+ on the right side, and 1+ on the left side.  Pulmonary/Chest: Effort normal. No respiratory distress. He has no wheezes.  Abdominal: Bowel sounds are normal. He exhibits no distension. There is no tenderness. There is no guarding and no CVA tenderness.  Musculoskeletal: He exhibits no edema.       Right shoulder: He exhibits no swelling and no deformity.  Neurological: He is alert and oriented to person, place, and time. He has normal strength and normal reflexes. No cranial nerve deficit or sensory deficit. Gait normal.  Skin: Skin is warm and dry. No rash noted. No cyanosis. Nails show no clubbing.  Psychiatric: His speech is normal. Cognition and memory are normal.  Reluctant  affect. Unconcerned attitude.   CMP ( most recent)  CMP     Component Value Date/Time   NA 138 05/15/2018 1118   K 4.4 05/15/2018 1118   CL 100 05/15/2018 1118   CO2 21 05/15/2018 1118   GLUCOSE 329 (H) 05/15/2018 1118   GLUCOSE 133 (H) 11/27/2017 1511   BUN 8 05/15/2018 1118   CREATININE 0.79 05/15/2018 1118   CALCIUM 9.4 05/15/2018 1118   PROT 6.9 05/15/2018 1118   ALBUMIN 4.0 05/15/2018 1118   AST 53 (H) 05/15/2018 1118   ALT 84 (H) 05/15/2018 1118   ALKPHOS 113 05/15/2018 1118   BILITOT 0.5 05/15/2018 1118   GFRNONAA 101 05/15/2018 1118   GFRAA 117 05/15/2018 1118   Diabetic Labs (most recent): Lab Results  Component Value Date   HGBA1C 10.4 (H) 05/15/2018   HGBA1C 7.6 (H) 02/23/2018   HGBA1C 7.9 (H) 11/18/2017     Lipid Panel ( most recent) Lipid Panel     Component Value Date/Time   CHOL 137 11/18/2017 0823   TRIG 469 (H) 11/18/2017 0823   HDL 28 (L) 11/18/2017 0823   CHOLHDL 6.1 04/28/2014 0121   VLDL 61 (H) 04/28/2014 0121   LDLCALC Comment 11/18/2017 0823     Assessment & Plan:   1. DM type 2 causing vascular disease ([place])  - Patient has currently uncontrolled symptomatic type 2 DM since  57 years of age. -Patient returns with better engagement in monitoring, his logs showing blood glucose readings still significantly above target.  His most recent A1c was 10.4% increasing from 7.6%.    -his diabetes is complicated by coronary artery disease, CVA, obesity/sedentary life, chronic heavy smoking and [name] remains at extremely high risk  for more acute and chronic complications which include CAD, CVA, CKD, retinopathy, and neuropathy. These are all discussed in detail with the patient.  - I have counseled him on diet management and weight loss, by adopting a carbohydrate restricted/protein rich diet.  -  Suggestion is made for him to avoid simple carbohydrates  from his diet including Cakes, Sweet Desserts / Pastries, Ice Cream, Soda (diet and  regular), Sweet Tea, Candies, Chips, Cookies, Store Bought Juices, Alcohol in Excess of  1-2 drinks a day, Artificial Sweeteners, and "Sugar-free" Products. This will help patient to have stable blood glucose profile and potentially avoid unintended weight gain.   - I encouraged him to switch to  unprocessed or minimally processed complex starch and increased protein intake (animal or plant source), fruits, and vegetables.  - he is advised to stick to a routine mealtimes to eat 3 meals  a day and avoid unnecessary snacks ( to snack only to correct hypoglycemia).   - I have approached him with the following individualized plan to manage diabetes and patient agrees:   - He has significant cognitive deficit, for simplicity reasons he will be kept on premixed insulin.  - Besides, he could not afford the co-pays for insulin analogs.    -I advised him to continue Novolin 70/30, 30 units with breakfast and 30 units with supper  for pre-meal glucose readings above 90 mg/dL.   -  I would also advised him to continue Januvia 50 mg p.o. daily with breakfast.    -He does not tolerate metformin due to GI side effects.   -Patient is encouraged to call clinic for blood glucose levels less than 70 or above 300 mg /dl.  - Patient specific target  A1c;  LDL, HDL, Triglycerides, and  Waist Circumference were discussed in detail.  2)  BP/HTN:   His blood pressure is controlled to target.  He continues to smoke heavily, he is counseled extensively against smoking.  I advised him to continue his current blood pressure medications consistently including lisinopril 5 mg p.o. daily.   3) Lipids/HPL: his lipid panel is not controlled  with severe hypertriglyceridemia   of  469.  He is advised to continue Trilipix 135 mg p.o. nightly.    4)  Weight/Diet: CDE Consult has been  initiated , exercise, and detailed carbohydrates information provided.  5) Chronic Care/Health Maintenance:  -he  is on ACEI/ARB and Statin  medications and  is encouraged to continue to follow up with Ophthalmology, Dentist,  Podiatrist at least yearly or according to recommendations, and advised to  quit smoking. I have recommended yearly flu vaccine and pneumonia vaccination at least every 5 years; moderate intensity exercise for up to 150 minutes weekly; and  sleep for at least 7 hours a day.  - I advised patient to maintain close follow up with Sharilyn Sites, MD for primary care needs.  - Time spent with the patient: 25 min, of which >50% was spent in reviewing his blood glucose logs , discussing his hypo- and hyper-glycemic episodes, reviewing his current and  previous labs and insulin doses and developing a plan to avoid hypo- and hyper-glycemia. Please refer to Patient Instructions for Blood Glucose Monitoring and Insulin/Medications Dosing Guide"  in media tab for additional information. [name] participated in the discussions, expressed understanding, and voiced agreement with the above plans.  All questions were answered to his satisfaction. he is encouraged to contact clinic should he have any questions or concerns prior to his return visit.   Follow up plan: - Return in about 9 weeks (around 08/21/2018) for Follow up with Pre-visit Labs, Meter, and Logs.  - Time spent with the patient: 25 min, of which >50% was spent in reviewing his blood glucose logs , discussing his hypo- and hyper-glycemic episodes, reviewing his current and  previous labs and insulin doses and developing a plan to avoid hypo- and hyper-glycemia. Please refer to Patient Instructions for Blood Glucose Monitoring and Insulin/Medications Dosing Guide"  in media tab for additional information.  Glade Lloyd, MD Phone: [phone]  Fax: [phone]   06/19/2018, 4:40 PM   This note was partially dictated with voice recognition software. Similar sounding words can be transcribed inadequately or may not  be corrected upon review.

## 2018-06-29 DIAGNOSIS — Z6841 Body Mass Index (BMI) 40.0 and over, adult: Secondary | ICD-10-CM | POA: Diagnosis not present

## 2018-06-29 DIAGNOSIS — Z0001 Encounter for general adult medical examination with abnormal findings: Secondary | ICD-10-CM | POA: Diagnosis not present

## 2018-06-29 DIAGNOSIS — Z1389 Encounter for screening for other disorder: Secondary | ICD-10-CM | POA: Diagnosis not present

## 2018-07-04 ENCOUNTER — Encounter: Payer: Self-pay | Admitting: Nurse Practitioner

## 2018-07-04 ENCOUNTER — Ambulatory Visit: Payer: Medicare HMO | Admitting: Nurse Practitioner

## 2018-07-04 ENCOUNTER — Telehealth: Payer: Self-pay

## 2018-07-04 VITALS — BP 145/90 | HR 77 | Temp 98.7°F | Ht 70.0 in | Wt 290.2 lb

## 2018-07-04 DIAGNOSIS — K2270 Barrett's esophagus without dysplasia: Secondary | ICD-10-CM | POA: Diagnosis not present

## 2018-07-04 DIAGNOSIS — R1310 Dysphagia, unspecified: Secondary | ICD-10-CM | POA: Diagnosis not present

## 2018-07-04 DIAGNOSIS — R1012 Left upper quadrant pain: Secondary | ICD-10-CM

## 2018-07-04 DIAGNOSIS — K2100 Gastro-esophageal reflux disease with esophagitis, without bleeding: Secondary | ICD-10-CM

## 2018-07-04 DIAGNOSIS — R1319 Other dysphagia: Secondary | ICD-10-CM

## 2018-07-04 NOTE — Progress Notes (Signed)
CC'D TO PCP °

## 2018-07-04 NOTE — Assessment & Plan Note (Addendum)
Known history of Barrett's esophagus, will remain on PPI indefinitely.  He is currently due for surveillance EGD.  He is having dysphasia at this time and so we will plan to add on the dilation as well.  This will also shed some light on possible etiologies behind his left-sided/left upper quadrant abdominal pain.  Return for follow-up in 3 months.  Proceed with EGD +/- dilation on propofol/MAC with Dr. Oneida Alar in near future: the risks, benefits, and alternatives have been discussed with the patient in detail. The patient states understanding and desires to proceed.  The patient is currently on Eliquis, Valium, Lexapro.  No other anticoagulants, anxiolytics, chronic pain medications, antidepressants.  He is morbidly obese.  Given polypharmacy and obesity we will plan for the procedure on propofol/MAC, as was done for his last procedure, to promote adequate sedation.  We will contact his cardiologist for clearance as well as permission to hold Eliquis x48 hours prior to procedure.

## 2018-07-04 NOTE — Assessment & Plan Note (Signed)
The patient is having intermittent dysphasia symptoms.  He is currently due for EGD related to Barrett's surveillance.  We will plan for possible dilation on this.  This will also allow [place] to further evaluate for any worsening/progression of his Barrett's disease on surgical pathology.  Return for follow-up in 3 months.

## 2018-07-04 NOTE — Assessment & Plan Note (Signed)
GERD symptoms are currently well managed on PPI.  Recommended he continue his current medications.  Will need to be on PPI indefinitely due to history of Barrett's esophagus.  Follow-up in 3 months.

## 2018-07-04 NOTE — Patient Instructions (Signed)
1. Continue your current medications.  With Barrett's esophagus, you need to take your acid blocker indefinitely. 2. We will schedule your upper endoscopy with possible dilation for you. 3. We will check with the cardiologist to make sure they clear you for the procedure and that it is okay to hold Eliquis for 48 hours prior. 4. Further recommendations will be made after your endoscopy. 5. Return for follow-up in 3 months. 6. Depending on how your abdominal pain does and findings on EGD we can consider further work-up for abdominal pain. 7. Call us if you have any questions or concerns.  At [facility] we value your feedback. You may receive a survey about your visit today. Please share your experience as we strive to create trusting relationships with our patients to provide genuine, compassionate, quality care.  We also encourage the use of MyChart, which contains your medical information for your review as well. If you are not enrolled in this feature, an access code is on this after visit summary for your convenience. Thank you for allowing [place] to be involved in your care.  It was great to see you today!  I hope you have a great summer!!

## 2018-07-04 NOTE — Telephone Encounter (Signed)
I called [facility], Dr. Noel Journey' office @ [phone]. Pt no longer see's Dr. Maudie Mercury. He last saw Dr. Carlota Raspberry in March 2019 and his next appt is with her. I am faxing the request to hold Eliquis to her @ [phone].

## 2018-07-04 NOTE — Progress Notes (Addendum)
REVIEWED-NO ADDITIONAL RECOMMENDATIONS.  Referring Provider: Sharilyn Sites, MD Primary Care Physician:  Sharilyn Sites, MD Primary GI:  Dr. Oneida Alar  Chief Complaint  Patient presents with  . Barrett's esophagus    due for EGD  . Dysphagia    doesn't have to be eating anything    HPI:   [name] is a 57 y.o. male who presents for repeat endoscopy due to history of Barrett's esophagus as well as dysphasia.  The patient was last seen in our office 08/23/2017 for rectal bleeding.  That time it was noted his previous colonoscopy in 03/2008 found transverse colon ulcer, pathology benign.  Noted history of Barrett's esophagus next due for EGD in 2019.  Noted some bleeding over the previous 2 to 3 years without rectal pain, on Eliquis, 4-5 bowel movements a day.  Heartburn well controlled without dysphagia.  Recommended colonoscopy.  Colonoscopy was completed 09/27/2017 and found normal ileum, twenty 4 to 8 mm polyps in the sigmoid colon, descending colon, splenic flexure, transverse colon, ascending colon, cecum.  An additional three 2 to 4 mm polyps in the rectum and the descending colon.  Diverticulosis, internal hemorrhoids.  Surgical pathology found the polyps to be one fragment of hyperplastic polyp and 22 fragments of tubular adenoma..  Recommended repeat colonoscopy in 3 years (2021).  Today he states he's doing ok. Having some waxing and waning depression symptoms, currently on medications. No homicidal or suicidal ideations. GERD symptoms are well controlled on medication. Having some solid food, liquid. Denies pill dysphagia. "Not as bad as it used to be." Liquids sometimes regurgitate. Has abdominal pain mid to left abdomen; intermittent; described as "just a pain, like I get bloated and it starts hurting." Left-sided pain is dull. Notes hematochezia, intermittent for the past year; occurs about 1-2 times a month. Denies constipation symptoms. Has frequent bowel movements, tend to be  formed at first, typically has liquid stools after that. Denies hard stools or straining. Some fecal urgency. Has been told "I have mini heart attacks". Denies fever, chills, unintentional weight loss. He has some dyspnea and dizziness, on CPAP and O2 at night. Denies lightheadedness, syncope, near syncope. Denies any other upper or lower GI symptoms.  Cardiologist is Dr. Clista Bernhardt with [facility] for cardiology.  Past Medical History:  Diagnosis Date  . Abnormal myocardial perfusion study 01/01/2011   there a small to moderate sized inferobasal scar  . Barrett's esophagus   . Cataracts, bilateral   . Chronic kidney disease    hx of kidney stones  . Claudication ([place]) 11/16/2011   PV test perform shows normal  . COPD (chronic obstructive pulmonary disease) ([place])   . Depression   . Diabetes ([place])    type 2 diabetes mellitus  . Dysrhythmia   . GERD (gastroesophageal reflux disease)   . Glaucoma   . Hernia of abdominal wall   . HTN (hypertension)   . Hyperlipidemia   . Morbid obesity ([place])   . Myocardial infarction ([place]) 2008,2009,2009  . OSA (obstructive sleep apnea)    on cpap  . PAF (paroxysmal atrial fibrillation) ([place])   . S/P colonoscopy 2009   3-4 mm transverse colon erosions likely secondary to  ASA  . S/P endoscopy Dec 2011   moderate erosive gastritis, Barrett's esophagus 1-2cm  . Sleep apnea   . SOB (shortness of breath) 11/03/2007   2D Echo EF 50%-55%  . Stroke [facility])     Past Surgical History:  Procedure Laterality Date  .  BIOPSY N/A 04/29/2015   Procedure: BIOPSY;  Surgeon: Danie Binder, MD;  Location: [facility];  Service: Endoscopy;  Laterality: N/A;  . CABG X 4  03/2008  . CARDIAC CATHETERIZATION  2009   stent placement to the left circumflex a 2.25   . CARDIAC CATHETERIZATION  07/08/2010  . COLONOSCOPY WITH PROPOFOL N/A 09/27/2017   Procedure: COLONOSCOPY WITH PROPOFOL;  Surgeon: Danie Binder, MD;  Location: [facility];  Service: Endoscopy;  Laterality:  N/A;  12:00pm  . CORONARY ARTERY BYPASS GRAFT  2008   4 vessels  . CORONARY STENT PLACEMENT  12/29/12  . CORONARY STENT PLACEMENT  12/2012  . ESOPHAGEAL DILATION N/A 04/29/2015   Procedure: ESOPHAGEAL DILATION 15 mm, 16 mm;  Surgeon: Danie Binder, MD;  Location: [facility];  Service: Endoscopy;  Laterality: N/A;  . ESOPHAGOGASTRODUODENOSCOPY  09/2011   Barrett's esophagus, no dysplasia on biopsies. Distal esophagitis. Status post dilation. Moderate gastritis and duodenitis, but biopsies benign. Next EGD in November 2015 for surveillance of Barrett's esophagus.  . ESOPHAGOGASTRODUODENOSCOPY (EGD) WITH PROPOFOL N/A 04/29/2015   SLF: 1. Barretts esophagus 2. Moderate non-erosive gastritis.   Marland Kitchen HERNIA REPAIR     ventral hernia repair  . LEFT HEART CATHETERIZATION WITH CORONARY/GRAFT ANGIOGRAM N/A 12/29/2012   Procedure: LEFT HEART CATHETERIZATION WITH Beatrix Fetters;  Surgeon: Troy Sine, MD;  Location: [facility] C[facility] LAB;  Service: Cardiovascular;  Laterality: N/A;  . ORIF FIBULA FRACTURE Right 06/03/2015   Procedure: OPEN REDUCTION INTERNAL FIXATION (ORIF) DISTAL FIBULA  FRACTURE;  Surgeon: Garald Balding, MD;  Location: [place];  Service: Orthopedics;  Laterality: Right;  . PERCUTANEOUS CORONARY STENT INTERVENTION (PCI-S)  12/29/2012   Procedure: PERCUTANEOUS CORONARY STENT INTERVENTION (PCI-S);  Surgeon: Troy Sine, MD;  Location: [facility] CATH LAB;  Service: Cardiovascular;;  . POLYPECTOMY  09/27/2017   Procedure: POLYPECTOMY;  Surgeon: Danie Binder, MD;  [facility];  Service: Endoscopy;;  cecal polyp, ascending polyps x6, transverse colon polyps x6, splenic flexure polyps x2, descending colon polyps x6, sigmoid  colon polyp x1, rectal polyp x1   . SAVORY DILATION  09/06/2011   Procedure: SAVORY DILATION;  Surgeon: Dorothyann Peng, MD;  Location: [facility];  Service: Endoscopy;  Laterality: N/A;  Dilated with 19m    Current Outpatient Medications  Medication Sig Dispense Refill    . amLODipine (NORVASC) 5 MG tablet TAKE 1 TABLET BY MOUTH EVERY DAY 15 tablet 0  . apixaban (ELIQUIS) 5 MG TABS tablet Take 1 tablet (5 mg total) by mouth 2 (two) times daily. 1 tablet 0  . Blood Glucose Monitoring Suppl (ACCU-CHEK GUIDE) w/Device KIT 1 each by Does not apply route 4 (four) times daily. 1 kit 0  . Choline Fenofibrate (TRILIPIX) 135 MG capsule Take 135 mg by mouth at bedtime.     . diazepam (VALIUM) 5 MG tablet Take 5 mg by mouth 2 (two) times daily.    .Marland Kitchenescitalopram (LEXAPRO) 20 MG tablet Take 20 mg daily by mouth.     . Fluticasone-Umeclidin-Vilant (TRELEGY ELLIPTA) 100-62.5-25 MCG/INH AEPB Inhale 1 puff daily into the lungs.     . furosemide (LASIX) 80 MG tablet Take 80 mg by mouth daily.  4  . glucose blood (ACCU-CHEK GUIDE) test strip Use as instructed 4 x daily. E11.65 150 each 5  . Insulin Isophane & Regular Human (HUMULIN 70/30 MIX) (70-30) 100 UNIT/ML PEN Inject 50 Units into the skin 2 (two) times daily before a meal. 30 mL  2  . Insulin Pen Needle (PEN NEEDLES) 32G X 6 MM MISC 1 each by Does not apply route 4 (four) times daily. 200 each 5  . isosorbide mononitrate (IMDUR) 60 MG 24 hr tablet Take 1 tablet (60 mg total) by mouth 3 (three) times daily. Please make an appointment for additional refills (Patient taking differently: Take 60 mg by mouth 2 (two) times daily. ) 45 tablet 0  . LANCETS ULTRA FINE MISC 1 each by Does not apply route 4 (four) times daily. 150 each 5  . lisinopril (PRINIVIL,ZESTRIL) 5 MG tablet Take 5 mg by mouth daily.    . meclizine (ANTIVERT) 25 MG tablet Take 25 mg by mouth 2 (two) times daily as needed for dizziness.    . metoprolol tartrate (LOPRESSOR) 100 MG tablet TAKE 1 TABLET BY MOUTH TWICE A DAY 180 tablet 3  . modafinil (PROVIGIL) 100 MG tablet Take 100 mg daily by mouth.     . modafinil (PROVIGIL) 100 MG tablet Take by mouth 2 (two) times daily.    . nitroGLYCERIN (NITROLINGUAL) 0.4 MG/SPRAY spray Place 1 spray under the tongue every 5  (five) minutes x 3 doses as needed for chest pain.    Glory Rosebush DELICA LANCETS FINE MISC 1 each by Does not apply route 4 (four) times daily. e11.65 150 each 5  . pantoprazole (PROTONIX) 40 MG tablet TAKE 1 TABLET BY MOUTH 30 MINUTES BEFORE BREAKFAST (Patient taking differently: TAKE 40 MG BY MOUTH 30 MINUTES BEFORE BREAKFAST) 90 tablet 3  . potassium chloride SA (K-DUR,KLOR-CON) 20 MEQ tablet Take 1 tablet (20 mEq total) by mouth 2 (two) times daily. 30 tablet 0  . ranitidine (ZANTAC) 150 MG capsule Take 150 mg at bedtime by mouth.     . rosuvastatin (CRESTOR) 20 MG tablet Take 1 tablet by mouth daily.    . sitaGLIPtin (JANUVIA) 50 MG tablet Take 1 tablet (50 mg total) by mouth daily. (Patient taking differently: Take 25 mg by mouth daily. ) 50 tablet 2  . umeclidinium-vilanterol (ANORO ELLIPTA) 62.5-25 MCG/INH AEPB Inhale 1 puff into the lungs daily.    . Vitamin D, Ergocalciferol, (DRISDOL) 50000 units CAPS capsule TAKE 1 CAPSULE BY MOUTH ONE TIME PER WEEK 12 capsule 0   No current facility-administered medications for this visit.     Allergies as of 07/04/2018 - Review Complete 07/04/2018  Allergen Reaction Noted  . Contrast media [iodinated diagnostic agents] Other (See Comments) 03/14/2013  . Iohexol Other (See Comments) 01/02/2008    Family History  Problem Relation Age of Onset  . Diabetes Mother   . Heart disease Mother   . Diabetes Father   . Heart disease Father   . Pancreatic cancer Brother        age 34, doing well  . Colon cancer Neg Hx   . Anesthesia problems Neg Hx   . Hypotension Neg Hx   . Malignant hyperthermia Neg Hx   . Pseudochol deficiency Neg Hx     Social History   Socioeconomic History  . Marital status: Divorced    Spouse name: Not on file  . Number of children: Not on file  . Years of education: Not on file  . Highest education level: Not on file  Occupational History  . Not on file  Social Needs  . Financial resource strain: Not on file  .  Food insecurity:    Worry: Not on file    Inability: Not on file  . Transportation needs:  Medical: Not on file    Non-medical: Not on file  Tobacco Use  . Smoking status: Current Every Day Smoker    Packs/day: 0.50    Years: 30.00    Pack years: 15.00    Types: Cigarettes  . Smokeless tobacco: Never Used  Substance and Sexual Activity  . Alcohol use: Yes    Comment: occ  . Drug use: No  . Sexual activity: Never  Lifestyle  . Physical activity:    Days per week: Not on file    Minutes per session: Not on file  . Stress: Not on file  Relationships  . Social connections:    Talks on phone: Not on file    Gets together: Not on file    Attends religious service: Not on file    Active member of club or organization: Not on file    Attends meetings of clubs or organizations: Not on file    Relationship status: Not on file  Other Topics Concern  . Not on file  Social History Narrative  . Not on file    Review of Systems: Complete ROS negative except as per HPI.   Physical Exam: BP (!) 145/90   Pulse 77   Temp 98.7 F (37.1 C) (Oral)   Ht '5\' 10"'  (1.778 m)   Wt 290 lb 3.2 oz (131.6 kg)   BMI 41.64 kg/m  General:   Alert and oriented. Pleasant and cooperative. Well-nourished and well-developed.  Eyes:  Without icterus, sclera clear and conjunctiva pink.  Ears:  Normal auditory acuity. Cardiovascular:  S1, S2 present without murmurs appreciated. Extremities without clubbing or edema. Respiratory:  Clear to auscultation bilaterally. No wheezes, rales, or rhonchi. No distress.  Gastrointestinal:  +BS, soft, and non-distended. Mild LUQ soreness noted. No HSM noted. No guarding or rebound. No masses appreciated.  Rectal:  Deferred  Musculoskalatal:  Symmetrical without gross deformities. Neurologic:  Alert and oriented x4;  grossly normal neurologically. Psych:  Alert and cooperative. Normal mood and affect. Heme/Lymph/Immune: No excessive bruising  noted.    07/04/2018 11:05 AM   Disclaimer: This note was dictated with voice recognition software. Similar sounding words can inadvertently be transcribed and may not be corrected upon review.

## 2018-07-04 NOTE — Assessment & Plan Note (Signed)
His abdominal pain seems to be left upper quadrant/left-sided.  This is likely related to GERD and Barrett's.  He is due for EGD and will proceed with this first.  If his EGD does not find any possible etiologies for his pain we can consider CT scan at his next office visit, depending on his symptomatic progression.  Denies significant constipation.  His pain seems to be somewhat chronic.  Return for follow-up in 3 months.

## 2018-07-11 DIAGNOSIS — I509 Heart failure, unspecified: Secondary | ICD-10-CM | POA: Diagnosis not present

## 2018-07-11 DIAGNOSIS — G4733 Obstructive sleep apnea (adult) (pediatric): Secondary | ICD-10-CM | POA: Diagnosis not present

## 2018-07-11 DIAGNOSIS — I25119 Atherosclerotic heart disease of native coronary artery with unspecified angina pectoris: Secondary | ICD-10-CM | POA: Diagnosis not present

## 2018-07-11 DIAGNOSIS — J449 Chronic obstructive pulmonary disease, unspecified: Secondary | ICD-10-CM | POA: Diagnosis not present

## 2018-07-13 ENCOUNTER — Ambulatory Visit (INDEPENDENT_AMBULATORY_CARE_PROVIDER_SITE_OTHER): Payer: Medicare HMO | Admitting: Otolaryngology

## 2018-07-13 DIAGNOSIS — K219 Gastro-esophageal reflux disease without esophagitis: Secondary | ICD-10-CM

## 2018-07-13 NOTE — Telephone Encounter (Signed)
Received a fax that Dr is on vacation and should return around 07/17/2018 and will address then.

## 2018-07-17 ENCOUNTER — Other Ambulatory Visit: Payer: Self-pay

## 2018-07-17 DIAGNOSIS — R1012 Left upper quadrant pain: Secondary | ICD-10-CM

## 2018-07-17 DIAGNOSIS — R1319 Other dysphagia: Secondary | ICD-10-CM

## 2018-07-17 DIAGNOSIS — K2100 Gastro-esophageal reflux disease with esophagitis, without bleeding: Secondary | ICD-10-CM

## 2018-07-17 DIAGNOSIS — R1310 Dysphagia, unspecified: Secondary | ICD-10-CM

## 2018-07-17 DIAGNOSIS — K2270 Barrett's esophagus without dysplasia: Secondary | ICD-10-CM

## 2018-07-17 NOTE — Telephone Encounter (Signed)
Called pt on mobile number. EGD/-/+DIL w/Propofol w/SLF scheduled for 08/01/18 at 2:30pm. Informed him to hold Eliquis for 48 hours prior to procedure.

## 2018-07-17 NOTE — Telephone Encounter (Signed)
Called and informed pt of pre-op appt 07/26/18 at 2:45pm. Letter mailed with procedure instructions.

## 2018-07-17 NOTE — Telephone Encounter (Signed)
Received fax from Dr. Truett Mainland that it is OK to hold Eliquis x 48 hours prior to procedure and to resume when stable from bleeding standpoint.   Forwarding FYI to Walden Field, NP and to [facility].

## 2018-07-17 NOTE — Telephone Encounter (Signed)
Tried to call pt, no answer, LMOAM for return call. 

## 2018-07-18 NOTE — Telephone Encounter (Signed)
Noted, no further recommendations at this time. 

## 2018-07-25 ENCOUNTER — Emergency Department (HOSPITAL_COMMUNITY)
Admission: EM | Admit: 2018-07-25 | Discharge: 2018-07-25 | Disposition: A | Discharge status: Home / Self Care | Payer: Medicare HMO | Attending: Emergency Medicine | Admitting: Emergency Medicine

## 2018-07-25 ENCOUNTER — Other Ambulatory Visit: Payer: Self-pay

## 2018-07-25 ENCOUNTER — Emergency Department (HOSPITAL_COMMUNITY): Payer: Medicare HMO

## 2018-07-25 ENCOUNTER — Encounter (HOSPITAL_COMMUNITY): Payer: Self-pay | Admitting: Emergency Medicine

## 2018-07-25 DIAGNOSIS — M25562 Pain in left knee: Secondary | ICD-10-CM | POA: Diagnosis not present

## 2018-07-25 DIAGNOSIS — M25531 Pain in right wrist: Secondary | ICD-10-CM | POA: Diagnosis not present

## 2018-07-25 DIAGNOSIS — Y9389 Activity, other specified: Secondary | ICD-10-CM | POA: Diagnosis not present

## 2018-07-25 DIAGNOSIS — I130 Hypertensive heart and chronic kidney disease with heart failure and stage 1 through stage 4 chronic kidney disease, or unspecified chronic kidney disease: Secondary | ICD-10-CM | POA: Insufficient documentation

## 2018-07-25 DIAGNOSIS — N189 Chronic kidney disease, unspecified: Secondary | ICD-10-CM | POA: Diagnosis not present

## 2018-07-25 DIAGNOSIS — W06XXXA Fall from bed, initial encounter: Secondary | ICD-10-CM | POA: Diagnosis not present

## 2018-07-25 DIAGNOSIS — Z794 Long term (current) use of insulin: Secondary | ICD-10-CM | POA: Insufficient documentation

## 2018-07-25 DIAGNOSIS — Y999 Unspecified external cause status: Secondary | ICD-10-CM | POA: Insufficient documentation

## 2018-07-25 DIAGNOSIS — S8002XA Contusion of left knee, initial encounter: Secondary | ICD-10-CM | POA: Diagnosis not present

## 2018-07-25 DIAGNOSIS — S6991XA Unspecified injury of right wrist, hand and finger(s), initial encounter: Secondary | ICD-10-CM | POA: Diagnosis not present

## 2018-07-25 DIAGNOSIS — I5032 Chronic diastolic (congestive) heart failure: Secondary | ICD-10-CM | POA: Insufficient documentation

## 2018-07-25 DIAGNOSIS — F17210 Nicotine dependence, cigarettes, uncomplicated: Secondary | ICD-10-CM | POA: Diagnosis not present

## 2018-07-25 DIAGNOSIS — R062 Wheezing: Secondary | ICD-10-CM | POA: Diagnosis not present

## 2018-07-25 DIAGNOSIS — S63501A Unspecified sprain of right wrist, initial encounter: Secondary | ICD-10-CM | POA: Diagnosis not present

## 2018-07-25 DIAGNOSIS — S8992XA Unspecified injury of left lower leg, initial encounter: Secondary | ICD-10-CM | POA: Diagnosis not present

## 2018-07-25 DIAGNOSIS — R0602 Shortness of breath: Secondary | ICD-10-CM | POA: Diagnosis not present

## 2018-07-25 DIAGNOSIS — R0789 Other chest pain: Secondary | ICD-10-CM | POA: Diagnosis not present

## 2018-07-25 DIAGNOSIS — R079 Chest pain, unspecified: Secondary | ICD-10-CM | POA: Diagnosis not present

## 2018-07-25 DIAGNOSIS — S299XXA Unspecified injury of thorax, initial encounter: Secondary | ICD-10-CM | POA: Diagnosis not present

## 2018-07-25 DIAGNOSIS — Y92003 Bedroom of unspecified non-institutional (private) residence as the place of occurrence of the external cause: Secondary | ICD-10-CM | POA: Diagnosis not present

## 2018-07-25 DIAGNOSIS — E1122 Type 2 diabetes mellitus with diabetic chronic kidney disease: Secondary | ICD-10-CM | POA: Insufficient documentation

## 2018-07-25 LAB — BASIC METABOLIC PANEL
ANION GAP: 13 (ref 5–15)
BUN: 7 mg/dL (ref 6–20)
CALCIUM: 8.5 mg/dL — AB (ref 8.9–10.3)
CO2: 20 mmol/L — AB (ref 22–32)
CREATININE: 0.69 mg/dL (ref 0.61–1.24)
Chloride: 105 mmol/L (ref 98–111)
Glucose, Bld: 314 mg/dL — ABNORMAL HIGH (ref 70–99)
Potassium: 3.4 mmol/L — ABNORMAL LOW (ref 3.5–5.1)
Sodium: 138 mmol/L (ref 135–145)

## 2018-07-25 LAB — CBC
HCT: 44.6 % (ref 39.0–52.0)
Hemoglobin: 15.6 g/dL (ref 13.0–17.0)
MCH: 32.2 pg (ref 26.0–34.0)
MCHC: 35 g/dL (ref 30.0–36.0)
MCV: 92 fL (ref 78.0–100.0)
PLATELETS: 265 10*3/uL (ref 150–400)
RBC: 4.85 MIL/uL (ref 4.22–5.81)
RDW: 12.5 % (ref 11.5–15.5)
WBC: 13.2 10*3/uL — AB (ref 4.0–10.5)

## 2018-07-25 LAB — I-STAT TROPONIN, ED
TROPONIN I, POC: 0 ng/mL (ref 0.00–0.08)
Troponin i, poc: 0 ng/mL (ref 0.00–0.08)

## 2018-07-25 MED ORDER — IPRATROPIUM BROMIDE 0.02 % IN SOLN
0.5000 mg | Freq: Once | RESPIRATORY_TRACT | Status: AC
  Administered 2018-07-25: 0.5 mg via RESPIRATORY_TRACT
  Filled 2018-07-25: qty 2.5

## 2018-07-25 MED ORDER — ALBUTEROL SULFATE (2.5 MG/3ML) 0.083% IN NEBU
5.0000 mg | INHALATION_SOLUTION | Freq: Once | RESPIRATORY_TRACT | Status: AC
  Administered 2018-07-25: 5 mg via RESPIRATORY_TRACT
  Filled 2018-07-25: qty 6

## 2018-07-25 MED ORDER — POTASSIUM CHLORIDE CRYS ER 20 MEQ PO TBCR
40.0000 meq | EXTENDED_RELEASE_TABLET | Freq: Once | ORAL | Status: AC
  Administered 2018-07-25: 40 meq via ORAL
  Filled 2018-07-25: qty 2

## 2018-07-25 MED ORDER — HYDROCODONE-ACETAMINOPHEN 5-325 MG PO TABS
2.0000 | ORAL_TABLET | Freq: Once | ORAL | Status: AC
  Administered 2018-07-25: 2 via ORAL
  Filled 2018-07-25: qty 2

## 2018-07-25 MED ORDER — ALBUTEROL SULFATE HFA 108 (90 BASE) MCG/ACT IN AERS: 2.0000 | INHALATION_SPRAY | RESPIRATORY_TRACT | 1 refills | Status: DC | PRN

## 2018-07-25 MED ORDER — TRAMADOL HCL 50 MG PO TABS: 50.0000 mg | ORAL_TABLET | Freq: Four times a day (QID) | ORAL | 0 refills | Status: DC | PRN

## 2018-07-25 NOTE — ED Provider Notes (Addendum)
[facility] EMERGENCY DEPARTMENT Provider Note   CSN: [id number] Arrival date & time: 07/25/18  0941     History   Chief Complaint Chief Complaint  Patient presents with  . Wrist Pain  . Chest Pain    HPI LE [name] is a 57 y.o. male.  Patient c/o fall from bed this AM, injuring right wrist and left knee. Post fall, pain to those areas moderate, constant, persistent, non radiating, worse w palpation. Has been ambulatory since fall. Denies head injury, loc, or headache. No nv. Skin intact. No neck or back pain. Went to pcp office and after sitting in waiting room for 1 hr, noted mid chest pain. Chest pain dull, moderate, persistent, worse w coughing. Recent increased cough, non productive. Hx copd, has not used breathing tx this AM and notes increased wheezing. No exertional cp or discomfort. No pleuritic pain. No leg pain or swelling.   The history is provided by the patient.  Wrist Pain  Associated symptoms include chest pain. Pertinent negatives include no abdominal pain and no headaches.  Chest Pain   Associated symptoms include cough. Pertinent negatives include no abdominal pain, no back pain, no fever, no headaches, no nausea, no numbness, no vomiting and no weakness.    Past Medical History:  Diagnosis Date  . Abnormal myocardial perfusion study 01/01/2011   there a small to moderate sized inferobasal scar  . Barrett's esophagus   . Cataracts, bilateral   . Chronic kidney disease    hx of kidney stones  . Claudication ([place]) 11/16/2011   PV test perform shows normal  . COPD (chronic obstructive pulmonary disease) ([place])   . Depression   . Diabetes ([place])    type 2 diabetes mellitus  . Dysrhythmia   . GERD (gastroesophageal reflux disease)   . Glaucoma   . Hernia of abdominal wall   . HTN (hypertension)   . Hyperlipidemia   . Morbid obesity ([place])   . Myocardial infarction ([place]) 2008,2009,2009  . OSA (obstructive sleep apnea)    on cpap  . PAF  (paroxysmal atrial fibrillation) ([place])   . S/P colonoscopy 2009   3-4 mm transverse colon erosions likely secondary to  ASA  . S/P endoscopy Dec 2011   moderate erosive gastritis, Barrett's esophagus 1-2cm  . Sleep apnea   . SOB (shortness of breath) 11/03/2007   2D Echo EF 50%-55%  . Stroke [facility])     Patient Active Problem List   Diagnosis Date Noted  . Personal history of noncompliance with medical treatment, presenting hazards to health 06/05/2018  . Chest pain at rest 11/27/2017  . Chronic kidney disease 11/27/2017  . DM type 2 causing vascular disease ([place]) 11/27/2017  . HTN (hypertension) 11/27/2017  . H/O Stroke 11/27/2017  . Glaucoma 11/27/2017  . Current every day smoker 11/27/2017  . Severe Vitamin D deficiency 11/27/2017  . Diastolic congestive heart failure, NYHA class 2 ([place]) 11/27/2017  . History of colonic polyps   . Rectal bleeding 08/23/2017  . COPD (chronic obstructive pulmonary disease) ([place])   . Fracture of fibula, right, closed 06/03/2015  . Dysphagia 04/07/2015  . Obesity, unspecified 04/28/2014  . CVA (cerebral infarction) 04/27/2014  . Aphasia 04/27/2014  . Pneumothorax on right 03/16/2014  . Lung nodule 03/16/2014  . Chest pain 03/15/2014  . Angina, class III ([place]) 03/14/2013  . Current smoker 03/14/2013  . Depression 03/14/2013  . Unstable angina ([place]) 12/30/2012  . Abnormal nuclear stress test 12/30/2012  . Barrett's esophagus  08/25/2011  . Abnormal myocardial perfusion study 01/01/2011  . ABDOMINAL PAIN, EPIGASTRIC 09/29/2010  . GERD (gastroesophageal reflux disease) 06/01/2010  . HEMATOCHEZIA 06/01/2010  . HEMATURIA UNSPECIFIED 06/01/2010  . Abdominal pain 06/01/2010  . OSA (obstructive sleep apnea) 06/21/2008  . RESTLESS LEGS SYNDROME 06/21/2008  . MYOCARDIAL INFARCTION 06/21/2008  . ALLERGIC RHINITIS 06/21/2008  . Mixed hyperlipidemia 06/20/2008  . Morbid obesity ([place]) 06/20/2008  . Essential hypertension, benign 06/20/2008  . CAD  (coronary artery disease) 06/20/2008  . PAF (paroxysmal atrial fibrillation) ([place]) 06/20/2008  . SOB (shortness of breath) 11/03/2007    Past Surgical History:  Procedure Laterality Date  . BIOPSY N/A 04/29/2015   Procedure: BIOPSY;  Surgeon: Danie Binder, MD;  Location: [facility] ORS;  Service: Endoscopy;  Laterality: N/A;  . CABG X 4  03/2008  . CARDIAC CATHETERIZATION  2009   stent placement to the left circumflex a 2.25   . CARDIAC CATHETERIZATION  07/08/2010  . COLONOSCOPY WITH PROPOFOL N/A 09/27/2017   Procedure: COLONOSCOPY WITH PROPOFOL;  Surgeon: Danie Binder, MD;  Location: [facility] ENDO SUITE;  Service: Endoscopy;  Laterality: N/A;  12:00pm  . CORONARY ARTERY BYPASS GRAFT  2008   4 vessels  . CORONARY STENT PLACEMENT  12/29/12  . CORONARY STENT PLACEMENT  12/2012  . ESOPHAGEAL DILATION N/A 04/29/2015   Procedure: ESOPHAGEAL DILATION 15 mm, 16 mm;  Surgeon: Danie Binder, MD;  Location: [facility] ORS;  Service: Endoscopy;  Laterality: N/A;  . ESOPHAGOGASTRODUODENOSCOPY  09/2011   Barrett's esophagus, no dysplasia on biopsies. Distal esophagitis. Status post dilation. Moderate gastritis and duodenitis, but biopsies benign. Next EGD in November 2015 for surveillance of Barrett's esophagus.  . ESOPHAGOGASTRODUODENOSCOPY (EGD) WITH PROPOFOL N/A 04/29/2015   SLF: 1. Barretts esophagus 2. Moderate non-erosive gastritis.   Marland Kitchen HERNIA REPAIR     ventral hernia repair  . LEFT HEART CATHETERIZATION WITH CORONARY/GRAFT ANGIOGRAM N/A 12/29/2012   Procedure: LEFT HEART CATHETERIZATION WITH Beatrix Fetters;  Surgeon: Troy Sine, MD;  Location: [facility] CATH LAB;  Service: Cardiovascular;  Laterality: N/A;  . ORIF FIBULA FRACTURE Right 06/03/2015   Procedure: OPEN REDUCTION INTERNAL FIXATION (ORIF) DISTAL FIBULA  FRACTURE;  Surgeon: Garald Balding, MD;  Location: [facility];  Service: Orthopedics;  Laterality: Right;  . PERCUTANEOUS CORONARY STENT INTERVENTION (PCI-S)  12/29/2012   Procedure: PERCUTANEOUS  CORONARY STENT INTERVENTION (PCI-S);  Surgeon: Troy Sine, MD;  Location: [facility] CATH LAB;  Service: Cardiovascular;;  . POLYPECTOMY  09/27/2017   Procedure: POLYPECTOMY;  Surgeon: Danie Binder, MD;  Location: [facility] END[facility];  Service: Endoscopy;;  cecal polyp, ascending polyps x6, transverse colon polyps x6, splenic flexure polyps x2, descending colon polyps x6, sigmoid  colon polyp x1, rectal polyp x1   . SAVORY DILATION  09/06/2011   Procedure: SAVORY DILATION;  Surgeon: Dorothyann Peng, MD;  Location: [facility] ORS;  Service: Endoscopy;  Laterality: N/A;  Dilated with 21m        Home Medications    Prior to Admission medications   Medication Sig Start Date End Date Taking? Authorizing Provider  amLODipine (NORVASC) 5 MG tablet TAKE 1 TABLET BY MOUTH EVERY DAY Patient taking differently: Take 5 mg by mouth daily.  03/30/18   LLendon Colonel NP  apixaban (ELIQUIS) 5 MG TABS tablet Take 1 tablet (5 mg total) by mouth 2 (two) times daily. 06/04/15   PCherylann Ratel PA-C  Blood Glucose Monitoring Suppl (ACCU-CHEK GUIDE) w/Device KIT 1 each by Does not apply route  4 (four) times daily. 11/21/17   Cassandria Anger, MD  diazepam (VALIUM) 5 MG tablet Take 5 mg by mouth 2 (two) times daily.    [provider]  escitalopram (LEXAPRO) 20 MG tablet Take 20 mg daily by mouth.  04/27/14   [provider]  fenofibrate micronized (LOFIBRA) 134 MG capsule Take 134 mg by mouth at bedtime.    [provider]  Fluticasone-Umeclidin-Vilant (TRELEGY ELLIPTA) 100-62.5-25 MCG/INH AEPB Inhale 1 puff daily into the lungs.     [provider]  furosemide (LASIX) 80 MG tablet Take 80 mg by mouth daily. 04/22/15   [provider]  glucose blood (ACCU-CHEK GUIDE) test strip Use as instructed 4 x daily. E11.65 11/21/17   Cassandria Anger, MD  Insulin Isophane & Regular Human (HUMULIN 70/30 MIX) (70-30) 100 UNIT/ML PEN Inject 50 Units into the skin 2 (two) times daily  before a meal. 06/19/18   Nida, Marella Chimes, MD  Insulin Pen Needle (PEN NEEDLES) 32G X 6 MM MISC 1 each by Does not apply route 4 (four) times daily. 11/21/17   Cassandria Anger, MD  isosorbide mononitrate (IMDUR) 60 MG 24 hr tablet Take 1 tablet (60 mg total) by mouth 3 (three) times daily. Please make an appointment for additional refills 03/21/15   Croitoru, Mihai, MD  LANCETS ULTRA FINE MISC 1 each by Does not apply route 4 (four) times daily. 11/21/17   Cassandria Anger, MD  lisinopril (PRINIVIL,ZESTRIL) 5 MG tablet Take 5 mg by mouth daily.    [provider]  meclizine (ANTIVERT) 25 MG tablet Take 25 mg by mouth 2 (two) times daily as needed for dizziness.    [provider]  metoprolol tartrate (LOPRESSOR) 100 MG tablet TAKE 1 TABLET BY MOUTH TWICE A DAY Patient taking differently: Take 100 mg by mouth 2 (two) times daily.  03/10/18   Arnoldo Lenis, MD  modafinil (PROVIGIL) 100 MG tablet Take 100 mg by mouth daily.     [provider]  nitroGLYCERIN (NITROLINGUAL) 0.4 MG/SPRAY spray Place 1 spray under the tongue every 5 (five) minutes x 3 doses as needed for chest pain.    [provider]  [facility] DELICA LANCETS FINE MISC 1 each by Does not apply route 4 (four) times daily. e11.65 02/14/18   Cassandria Anger, MD  pantoprazole (PROTONIX) 40 MG tablet TAKE 1 TABLET BY MOUTH 30 MINUTES BEFORE BREAKFAST Patient taking differently: Take 40 mg by mouth daily before breakfast.  08/19/16   Annitta Needs, NP  potassium chloride SA (K-DUR,KLOR-CON) 20 MEQ tablet Take 1 tablet (20 mEq total) by mouth 2 (two) times daily. 07/22/14   Croitoru, Mihai, MD  ranitidine (ZANTAC) 150 MG capsule Take 150 mg at bedtime by mouth.     [provider]  rosuvastatin (CRESTOR) 20 MG tablet Take 20 mg by mouth daily.  03/29/17   [provider]  sitaGLIPtin (JANUVIA) 100 MG tablet Take 50 mg by mouth daily.    [provider]    sitaGLIPtin (JANUVIA) 50 MG tablet Take 1 tablet (50 mg total) by mouth daily. Patient not taking: Reported on 07/21/2018 06/05/18   Cassandria Anger, MD  umeclidinium-vilanterol [facility] ELLIPTA) 62.5-25 MCG/INH AEPB Inhale 1 puff into the lungs every evening.     [provider]  Vitamin D, Ergocalciferol, (DRISDOL) 50000 units CAPS capsule TAKE 1 CAPSULE BY MOUTH ONE TIME PER WEEK Patient taking differently: Take 50,000 Units by mouth every Monday.  05/02/18   Cassandria Anger, MD    Family History Family History  Problem Relation Age of Onset  . Diabetes Mother   . Heart disease Mother   . Diabetes Father   . Heart disease Father   . Pancreatic cancer Brother        age 48, doing well  . Colon cancer Neg Hx   . Anesthesia problems Neg Hx   . Hypotension Neg Hx   . Malignant hyperthermia Neg Hx   . Pseudochol deficiency Neg Hx     Social History Social History   Tobacco Use  . Smoking status: Current Every Day Smoker    Packs/day: 0.50    Years: 30.00    Pack years: 15.00    Types: Cigarettes  . Smokeless tobacco: Never Used  Substance Use Topics  . Alcohol use: Yes    Comment: occ  . Drug use: No     Allergies   Contrast media [iodinated diagnostic agents] and Iohexol   Review of Systems Review of Systems  Constitutional: Negative for fever.  HENT: Negative for sore throat.   Eyes: Negative for pain and visual disturbance.  Respiratory: Positive for cough and wheezing.   Cardiovascular: Positive for chest pain. Negative for leg swelling.  Gastrointestinal: Negative for abdominal pain, nausea and vomiting.  Genitourinary: Negative for flank pain.  Musculoskeletal: Negative for back pain and neck pain.  Skin: Negative for wound.  Neurological: Negative for weakness, numbness and headaches.  Hematological: Does not bruise/bleed easily.  Psychiatric/Behavioral: Negative for confusion.     Physical Exam Updated Vital Signs BP 138/84 (BP  Location: Left Arm)   Temp 98.2 F (36.8 C) (Oral)   Resp (!) 24   Wt 136.1 kg   SpO2 94%   BMI 43.05 kg/m   Physical Exam  Constitutional: He is oriented to person, place, and time. He appears well-developed and well-nourished.  HENT:  Mouth/Throat: Oropharynx is clear and moist.  Eyes: Conjunctivae are normal.  Neck: Neck supple. No tracheal deviation present.  Cardiovascular: Normal rate, regular rhythm, normal heart sounds and intact distal pulses. Exam reveals no gallop and no friction rub.  No murmur heard. Pulmonary/Chest: Effort normal. No accessory muscle usage. No respiratory distress. He has wheezes. He exhibits tenderness.  Abdominal: Soft. Bowel sounds are normal. He exhibits no distension and no mass. There is no tenderness. There is no guarding.  Genitourinary:  Genitourinary Comments: No cva tenderness.  Musculoskeletal: He exhibits no edema.  Tenderness right wrist, mild sts. No focal scaphoid tenderness. Radial pulse 2+. Tenderness left knee anteriorly, no effusion, knee grossly stable on exam. Distal pulses palp. No other focal bony tenderness on bil ext exam. CTLS spine, non tender, aligned, no step off.   Neurological: He is alert and oriented to person, place, and time.  Speech clear/fluent. Motor/sens intact bil.   Skin: Skin is warm and dry.  Psychiatric: He has a normal mood and affect.  Nursing note and vitals reviewed.    ED Treatments / Results  Labs (all labs ordered are listed, but only abnormal results are displayed) Results for orders placed or performed during the hospital encounter of 07/25/18  CBC  Result Value Ref Range   WBC 13.2 (H) 4.0 - 10.5 K/uL   RBC 4.85 4.22 - 5.81 MIL/uL   Hemoglobin 15.6 13.0 - 17.0 g/dL   HCT 44.6 39.0 - 52.0 %   MCV 92.0 78.0 - 100.0 fL   MCH 32.2 26.0 - 34.0 pg  MCHC 35.0 30.0 - 36.0 g/dL   RDW 12.5 11.5 - 15.5 %   Platelets 265 150 - 400 K/uL  Basic metabolic panel  Result Value Ref Range   Sodium 138  135 - 145 mmol/L   Potassium 3.4 (L) 3.5 - 5.1 mmol/L   Chloride 105 98 - 111 mmol/L   CO2 20 (L) 22 - 32 mmol/L   Glucose, Bld 314 (H) 70 - 99 mg/dL   BUN 7 6 - 20 mg/dL   Creatinine, Ser 0.69 0.61 - 1.24 mg/dL   Calcium 8.5 (L) 8.9 - 10.3 mg/dL   GFR calc non Af Amer >60 >60 mL/min   GFR calc Af Amer >60 >60 mL/min   Anion gap 13 5 - 15  I-stat troponin, ED  Result Value Ref Range   Troponin i, poc 0.00 0.00 - 0.08 ng/mL   Comment 3          I-stat troponin, ED  Result Value Ref Range   Troponin i, poc 0.00 0.00 - 0.08 ng/mL   Comment 3           Dg Chest 2 View  Result Date: 07/25/2018 CLINICAL DATA:  [place] out of bed injuring the right wrist and left knee, some chest pain and shortness of breath EXAM: CHEST - 2 VIEW COMPARISON:  Portable chest x-ray of 11/27/2016 FINDINGS: Somewhat prominent markings remain diffusely throughout the lungs most likely chronic in nature. No pneumonia or effusion is seen. Median sternotomy sutures are noted from prior CABG, several of the sutures having broken. Heart size is stable with borderline cardiomegaly present. No bony abnormality is seen. IMPRESSION: Stable chronic change.  No active process. Electronically Signed   By: Ivar Drape M.D.   On: 07/25/2018 11:25   Dg Wrist Complete Right  Result Date: 07/25/2018 CLINICAL DATA:  [place] out of bed today with right wrist and left knee pain EXAM: RIGHT WRIST - COMPLETE 3+ VIEW COMPARISON:  None. FINDINGS: The right radiocarpal joint space appears normal and the ulnar styloid is intact. The carpal bones are normal position with normal alignment. No acute abnormality is seen. IMPRESSION: Negative. Electronically Signed   By: Ivar Drape M.D.   On: 07/25/2018 11:25   Dg Knee Complete 4 Views Left  Result Date: 07/25/2018 CLINICAL DATA:  [place] out of bed today with right wrist and left knee pain EXAM: LEFT KNEE - COMPLETE 4+ VIEW COMPARISON:  None. FINDINGS: The left knee joint spaces appear normal. No  fracture is seen. No joint effusion is noted. IMPRESSION: Negative. Electronically Signed   By: Ivar Drape M.D.   On: 07/25/2018 11:26    EKG EKG Interpretation  Date/Time:  Tuesday July 25 2018 09:52:37 EDT Ventricular Rate:  103 PR Interval:    QRS Duration: 94 QT Interval:  360 QTC Calculation: 472 R Axis:   14 Text Interpretation:  Sinus tachycardia Nonspecific T wave abnormality No significant change since last tracing Confirmed by Lajean Saver [phone]) on 07/25/2018 11:24:57 AM   Radiology Dg Chest 2 View  Result Date: 07/25/2018 CLINICAL DATA:  [place] out of bed injuring the right wrist and left knee, some chest pain and shortness of breath EXAM: CHEST - 2 VIEW COMPARISON:  Portable chest x-ray of 11/27/2016 FINDINGS: Somewhat prominent markings remain diffusely throughout the lungs most likely chronic in nature. No pneumonia or effusion is seen. Median sternotomy sutures are noted from prior CABG, several of the sutures having broken. Heart size is stable with borderline  cardiomegaly present. No bony abnormality is seen. IMPRESSION: Stable chronic change.  No active process. Electronically Signed   By: Ivar Drape M.D.   On: 07/25/2018 11:25   Dg Wrist Complete Right  Result Date: 07/25/2018 CLINICAL DATA:  [place] out of bed today with right wrist and left knee pain EXAM: RIGHT WRIST - COMPLETE 3+ VIEW COMPARISON:  None. FINDINGS: The right radiocarpal joint space appears normal and the ulnar styloid is intact. The carpal bones are normal position with normal alignment. No acute abnormality is seen. IMPRESSION: Negative. Electronically Signed   By: Ivar Drape M.D.   On: 07/25/2018 11:25   Dg Knee Complete 4 Views Left  Result Date: 07/25/2018 CLINICAL DATA:  [place] out of bed today with right wrist and left knee pain EXAM: LEFT KNEE - COMPLETE 4+ VIEW COMPARISON:  None. FINDINGS: The left knee joint spaces appear normal. No fracture is seen. No joint effusion is noted. IMPRESSION:  Negative. Electronically Signed   By: Ivar Drape M.D.   On: 07/25/2018 11:26    Procedures Procedures (including critical care time)  Medications Ordered in ED Medications  albuterol (PROVENTIL) (2.5 MG/3ML) 0.083% nebulizer solution 5 mg (has no administration in time range)  ipratropium (ATROVENT) nebulizer solution 0.5 mg (has no administration in time range)     Initial Impression / Assessment and Plan / ED Course  I have reviewed the triage vital signs and the nursing notes.  Pertinent labs & imaging results that were available during my care of the patient were reviewed by me and considered in my medical decision making (see chart for details).  Imaging studies ordered.   Albuterol and atrovent neb.   Reviewed nursing notes and prior charts for additional history.   Labs.   Pt requests pain med for wrist - hydrocodone po.   Labs reviewed - k sl low. Kcl po. Both initial and repeat trop normal.   xrays reviewed - no fx. Right wrist splinted ?contusion vs sprain.   Recheck post neb. No wheezing.  Recheck pt-  Pt requests d/c, states feels much better. No chest pain or discomfort. Wrist pain controlled.   Pt requests pain rx for home.  Final Clinical Impressions(s) / ED Diagnoses   Final diagnoses:  None    ED Discharge Orders    None        Lajean Saver, MD 07/25/18 1427

## 2018-07-25 NOTE — ED Triage Notes (Signed)
Pt reports fell out of bed this am hurting right wrist and left knee. Pt states went to PCP and waiting for over one hour to be seen starting having chest pain from pain and waiting.

## 2018-07-25 NOTE — Discharge Instructions (Addendum)
It was our pleasure to provide your ER care today - we hope that you feel better.  Use albuterol treatment as need if wheezing. Avoid any smoking.  For wrist injury - wear splint for comfort/support for the next week.   You may take ultram as need for pain - no driving when taking.   Follow up with primary care doctor in the coming week.  For chest discomfort, follow up with cardiologist in the coming week - call office to arrange appointment.  Return to ER if worse, new symptoms, fevers, chest pain, trouble breathing, other concern.

## 2018-07-26 ENCOUNTER — Other Ambulatory Visit: Payer: Self-pay

## 2018-07-26 ENCOUNTER — Encounter (HOSPITAL_COMMUNITY): Payer: Self-pay

## 2018-07-26 ENCOUNTER — Encounter (HOSPITAL_COMMUNITY)
Admission: RE | Admit: 2018-07-26 | Discharge: 2018-07-26 | Disposition: A | Discharge status: Home / Self Care | Payer: Medicare HMO | Source: Ambulatory Visit | Attending: Gastroenterology | Admitting: Gastroenterology

## 2018-07-26 DIAGNOSIS — Z01812 Encounter for preprocedural laboratory examination: Secondary | ICD-10-CM | POA: Insufficient documentation

## 2018-07-31 ENCOUNTER — Other Ambulatory Visit: Payer: Self-pay | Admitting: "Endocrinology

## 2018-07-31 DIAGNOSIS — E1159 Type 2 diabetes mellitus with other circulatory complications: Secondary | ICD-10-CM | POA: Diagnosis not present

## 2018-08-01 ENCOUNTER — Ambulatory Visit (HOSPITAL_COMMUNITY): Payer: Medicare HMO | Admitting: Anesthesiology

## 2018-08-01 ENCOUNTER — Encounter (HOSPITAL_COMMUNITY)
Admission: RE | Disposition: A | Discharge status: Home / Self Care | Payer: Self-pay | Source: Ambulatory Visit | Attending: Gastroenterology

## 2018-08-01 ENCOUNTER — Ambulatory Visit (HOSPITAL_COMMUNITY)
Admission: RE | Admit: 2018-08-01 | Discharge: 2018-08-01 | Disposition: A | Discharge status: Home / Self Care | Payer: Medicare HMO | Source: Ambulatory Visit | Attending: Gastroenterology | Admitting: Gastroenterology

## 2018-08-01 ENCOUNTER — Encounter (HOSPITAL_COMMUNITY): Payer: Self-pay | Admitting: Gastroenterology

## 2018-08-01 DIAGNOSIS — K2970 Gastritis, unspecified, without bleeding: Secondary | ICD-10-CM | POA: Insufficient documentation

## 2018-08-01 DIAGNOSIS — E785 Hyperlipidemia, unspecified: Secondary | ICD-10-CM | POA: Insufficient documentation

## 2018-08-01 DIAGNOSIS — Z7951 Long term (current) use of inhaled steroids: Secondary | ICD-10-CM | POA: Insufficient documentation

## 2018-08-01 DIAGNOSIS — H409 Unspecified glaucoma: Secondary | ICD-10-CM | POA: Insufficient documentation

## 2018-08-01 DIAGNOSIS — R1310 Dysphagia, unspecified: Secondary | ICD-10-CM

## 2018-08-01 DIAGNOSIS — N189 Chronic kidney disease, unspecified: Secondary | ICD-10-CM | POA: Insufficient documentation

## 2018-08-01 DIAGNOSIS — I132 Hypertensive heart and chronic kidney disease with heart failure and with stage 5 chronic kidney disease, or end stage renal disease: Secondary | ICD-10-CM | POA: Diagnosis not present

## 2018-08-01 DIAGNOSIS — G4733 Obstructive sleep apnea (adult) (pediatric): Secondary | ICD-10-CM | POA: Diagnosis not present

## 2018-08-01 DIAGNOSIS — E1122 Type 2 diabetes mellitus with diabetic chronic kidney disease: Secondary | ICD-10-CM | POA: Insufficient documentation

## 2018-08-01 DIAGNOSIS — I2510 Atherosclerotic heart disease of native coronary artery without angina pectoris: Secondary | ICD-10-CM | POA: Insufficient documentation

## 2018-08-01 DIAGNOSIS — Z7901 Long term (current) use of anticoagulants: Secondary | ICD-10-CM | POA: Diagnosis not present

## 2018-08-01 DIAGNOSIS — I480 Paroxysmal atrial fibrillation: Secondary | ICD-10-CM | POA: Insufficient documentation

## 2018-08-01 DIAGNOSIS — Z951 Presence of aortocoronary bypass graft: Secondary | ICD-10-CM | POA: Diagnosis not present

## 2018-08-01 DIAGNOSIS — F17210 Nicotine dependence, cigarettes, uncomplicated: Secondary | ICD-10-CM | POA: Diagnosis not present

## 2018-08-01 DIAGNOSIS — Z794 Long term (current) use of insulin: Secondary | ICD-10-CM | POA: Diagnosis not present

## 2018-08-01 DIAGNOSIS — K2100 Gastro-esophageal reflux disease with esophagitis, without bleeding: Secondary | ICD-10-CM

## 2018-08-01 DIAGNOSIS — K219 Gastro-esophageal reflux disease without esophagitis: Secondary | ICD-10-CM | POA: Insufficient documentation

## 2018-08-01 DIAGNOSIS — F329 Major depressive disorder, single episode, unspecified: Secondary | ICD-10-CM | POA: Insufficient documentation

## 2018-08-01 DIAGNOSIS — K2270 Barrett's esophagus without dysplasia: Secondary | ICD-10-CM

## 2018-08-01 DIAGNOSIS — Z955 Presence of coronary angioplasty implant and graft: Secondary | ICD-10-CM | POA: Insufficient documentation

## 2018-08-01 DIAGNOSIS — I252 Old myocardial infarction: Secondary | ICD-10-CM | POA: Insufficient documentation

## 2018-08-01 DIAGNOSIS — R1319 Other dysphagia: Secondary | ICD-10-CM

## 2018-08-01 DIAGNOSIS — K22719 Barrett's esophagus with dysplasia, unspecified: Secondary | ICD-10-CM | POA: Diagnosis present

## 2018-08-01 DIAGNOSIS — R1012 Left upper quadrant pain: Secondary | ICD-10-CM

## 2018-08-01 DIAGNOSIS — J449 Chronic obstructive pulmonary disease, unspecified: Secondary | ICD-10-CM | POA: Diagnosis not present

## 2018-08-01 DIAGNOSIS — I509 Heart failure, unspecified: Secondary | ICD-10-CM | POA: Insufficient documentation

## 2018-08-01 DIAGNOSIS — Z8673 Personal history of transient ischemic attack (TIA), and cerebral infarction without residual deficits: Secondary | ICD-10-CM | POA: Diagnosis not present

## 2018-08-01 HISTORY — PX: BIOPSY: SHX5522

## 2018-08-01 HISTORY — PX: ESOPHAGOGASTRODUODENOSCOPY (EGD) WITH PROPOFOL: SHX5813

## 2018-08-01 HISTORY — PX: SAVORY DILATION: SHX5439

## 2018-08-01 LAB — COMPREHENSIVE METABOLIC PANEL
A/G RATIO: 1.6 (ref 1.2–2.2)
ALBUMIN: 4.1 g/dL (ref 3.5–5.5)
ALT: 67 IU/L — ABNORMAL HIGH (ref 0–44)
AST: 42 IU/L — AB (ref 0–40)
Alkaline Phosphatase: 104 IU/L (ref 39–117)
BILIRUBIN TOTAL: 0.6 mg/dL (ref 0.0–1.2)
BUN / CREAT RATIO: 13 (ref 9–20)
BUN: 11 mg/dL (ref 6–24)
CHLORIDE: 99 mmol/L (ref 96–106)
CO2: 23 mmol/L (ref 20–29)
CREATININE: 0.87 mg/dL (ref 0.76–1.27)
Calcium: 9.6 mg/dL (ref 8.7–10.2)
GFR calc Af Amer: 112 mL/min/{1.73_m2} (ref >59)
GFR calc non Af Amer: 97 mL/min/{1.73_m2} (ref >59)
Globulin, Total: 2.6 g/dL (ref 1.5–4.5)
Glucose: 274 mg/dL — ABNORMAL HIGH (ref 65–99)
POTASSIUM: 4 mmol/L (ref 3.5–5.2)
Sodium: 140 mmol/L (ref 134–144)
Total Protein: 6.7 g/dL (ref 6.0–8.5)

## 2018-08-01 LAB — HGB A1C W/O EAG: HEMOGLOBIN A1C: 8.9 % — AB (ref 4.8–5.6)

## 2018-08-01 LAB — GLUCOSE, CAPILLARY: GLUCOSE-CAPILLARY: 232 mg/dL — AB (ref 70–99)

## 2018-08-01 SURGERY — ESOPHAGOGASTRODUODENOSCOPY (EGD) WITH PROPOFOL
Anesthesia: Monitor Anesthesia Care

## 2018-08-01 MED ORDER — LIDOCAINE HCL (CARDIAC) PF 50 MG/5ML IV SOSY
PREFILLED_SYRINGE | INTRAVENOUS | Status: DC | PRN
  Administered 2018-08-01: 40 mg via INTRAVENOUS

## 2018-08-01 MED ORDER — CHLORHEXIDINE GLUCONATE CLOTH 2 % EX PADS: 6.0000 | MEDICATED_PAD | Freq: Once | CUTANEOUS | Status: DC

## 2018-08-01 MED ORDER — GLYCOPYRROLATE 0.2 MG/ML IJ SOLN
INTRAMUSCULAR | Status: AC
  Filled 2018-08-01: qty 1

## 2018-08-01 MED ORDER — LIDOCAINE HCL (PF) 1 % IJ SOLN
INTRAMUSCULAR | Status: AC
  Filled 2018-08-01: qty 15

## 2018-08-01 MED ORDER — KETAMINE HCL 10 MG/ML IJ SOLN
INTRAMUSCULAR | Status: DC | PRN
  Administered 2018-08-01: 10 mg via INTRAVENOUS
  Administered 2018-08-01: 5 mg via INTRAVENOUS

## 2018-08-01 MED ORDER — PROMETHAZINE HCL 25 MG/ML IJ SOLN: 6.2500 mg | INTRAMUSCULAR | Status: DC | PRN

## 2018-08-01 MED ORDER — GLYCOPYRROLATE 0.2 MG/ML IJ SOLN
INTRAMUSCULAR | Status: DC | PRN
  Administered 2018-08-01: 0.2 mg via INTRAVENOUS

## 2018-08-01 MED ORDER — PROPOFOL 500 MG/50ML IV EMUL
INTRAVENOUS | Status: DC | PRN
  Administered 2018-08-01: 150 ug/kg/min via INTRAVENOUS
  Administered 2018-08-01: 09:00:00 via INTRAVENOUS

## 2018-08-01 MED ORDER — PROPOFOL 10 MG/ML IV BOLUS
INTRAVENOUS | Status: DC | PRN
  Administered 2018-08-01: 20 mg via INTRAVENOUS

## 2018-08-01 MED ORDER — LACTATED RINGERS IV SOLN: INTRAVENOUS | Status: DC

## 2018-08-01 MED ORDER — KETAMINE HCL 10 MG/ML IJ SOLN
INTRAMUSCULAR | Status: AC
  Filled 2018-08-01: qty 1

## 2018-08-01 MED ORDER — MEPERIDINE HCL 100 MG/ML IJ SOLN: 6.2500 mg | INTRAMUSCULAR | Status: DC | PRN

## 2018-08-01 MED ORDER — HYDROMORPHONE HCL 1 MG/ML IJ SOLN: 0.2500 mg | INTRAMUSCULAR | Status: DC | PRN

## 2018-08-01 MED ORDER — LACTATED RINGERS IV SOLN
INTRAVENOUS | Status: DC
  Administered 2018-08-01: 1000 mL via INTRAVENOUS

## 2018-08-01 MED ORDER — HYDROCODONE-ACETAMINOPHEN 7.5-325 MG PO TABS: 1.0000 | ORAL_TABLET | Freq: Once | ORAL | Status: DC | PRN

## 2018-08-01 NOTE — H&P (Signed)
\ Primary Care Physician:  Sharilyn Sites, MD Primary Gastroenterologist:  Dr. Oneida Alar  Pre-Procedure History & Physical: HPI:  [name] is a 57 y.o. male here for SCREENING FOR BARRETT'S ESOPHAGUS.  Past Medical History:  Diagnosis Date  . Abnormal myocardial perfusion study 01/01/2011   there a small to moderate sized inferobasal scar  . Barrett's esophagus   . Cataracts, bilateral   . Chronic kidney disease    hx of kidney stones  . Claudication ([place]) 11/16/2011   PV test perform shows normal  . COPD (chronic obstructive pulmonary disease) ([place])   . Depression   . Diabetes ([place])    type 2 diabetes mellitus  . Dysrhythmia   . GERD (gastroesophageal reflux disease)   . Glaucoma   . Hernia of abdominal wall   . HTN (hypertension)   . Hyperlipidemia   . Morbid obesity ([place])   . Myocardial infarction ([place]) 2008,2009,2009  . OSA (obstructive sleep apnea)    on cpap  . PAF (paroxysmal atrial fibrillation) ([place])   . S/P colonoscopy 2009   3-4 mm transverse colon erosions likely secondary to  ASA  . S/P endoscopy Dec 2011   moderate erosive gastritis, Barrett's esophagus 1-2cm  . Sleep apnea   . SOB (shortness of breath) 11/03/2007   2D Echo EF 50%-55%  . Stroke [facility])     Past Surgical History:  Procedure Laterality Date  . BIOPSY N/A 04/29/2015   Procedure: BIOPSY;  Surgeon: Danie Binder, MD;  Location: [facility] ORS;  Service: Endoscopy;  Laterality: N/A;  . CABG X 4  03/2008  . CARDIAC CATHETERIZATION  2009   stent placement to the left circumflex a 2.25   . CARDIAC CATHETERIZATION  07/08/2010  . COLONOSCOPY WITH PROPOFOL N/A 09/27/2017   Procedure: COLONOSCOPY WITH PROPOFOL;  Surgeon: Danie Binder, MD;  Location: [facility] END[facility] SU[facility];  Service: Endoscopy;  Laterality: N/A;  12:00pm  . CORONARY ARTERY BYPASS GRAFT  2008   4 vessels  . CORONARY STENT PLACEMENT  12/29/12  . CORONARY STENT PLACEMENT  12/2012  . ESOPHAGEAL DILATION N/A 04/29/2015   Procedure: ESOPHAGEAL  DILATION 15 mm, 16 mm;  Surgeon: Danie Binder, MD;  Location: [facility];  Service: Endoscopy;  Laterality: N/A;  . ESOPHAGOGASTRODUODENOSCOPY  09/2011   Barrett's esophagus, no dysplasia on biopsies. Distal esophagitis. Status post dilation. Moderate gastritis and duodenitis, but biopsies benign. Next EGD in November 2015 for surveillance of Barrett's esophagus.  . ESOPHAGOGASTRODUODENOSCOPY (EGD) WITH PROPOFOL N/A 04/29/2015   SLF: 1. Barretts esophagus 2. Moderate non-erosive gastritis.   Marland Kitchen HERNIA REPAIR     ventral hernia repair  . LEFT HEART CATHETERIZATION WITH CORONARY/GRAFT ANGIOGRAM N/A 12/29/2012   Procedure: LEFT HEART CATHETERIZATION WITH Beatrix Fetters;  Surgeon: Troy Sine, MD;  Location: [facility] CATH LAB;  Service: Cardiovascular;  Laterality: N/A;  . ORIF FIBULA FRACTURE Right 06/03/2015   Procedure: OPEN REDUCTION INTERNAL FIXATION (ORIF) DISTAL FIBULA  FRACTURE;  Surgeon: Garald Balding, MD;  Location: [place];  Service: Orthopedics;  Laterality: Right;  . PERCUTANEOUS CORONARY STENT INTERVENTION (PCI-S)  12/29/2012   Procedure: PERCUTANEOUS CORONARY STENT INTERVENTION (PCI-S);  Surgeon: Troy Sine, MD;  Location: [facility] C[facility] LAB;  Service: Cardiovascular;;  . POLYPECTOMY  09/27/2017   Procedure: POLYPECTOMY;  Surgeon: Danie Binder, MD;  [facility];  Service: Endoscopy;;  cecal polyp, ascending polyps x6, transverse colon polyps x6, splenic flexure polyps x2, descending colon polyps x6, sigmoid  colon polyp x1,  rectal polyp x1   . SAVORY DILATION  09/06/2011   Procedure: SAVORY DILATION;  Surgeon: Dorothyann Peng, MD;  Location: [facility];  Service: Endoscopy;  Laterality: N/A;  Dilated with 51m    Prior to Admission medications   Medication Sig Start Date End Date Taking? Authorizing Provider  albuterol (PROVENTIL HFA;VENTOLIN HFA) 108 (90 Base) MCG/ACT inhaler Inhale 2 puffs into the lungs every 4 (four) hours as needed for wheezing or shortness of breath.  07/25/18  Yes SLajean Saver MD  amLODipine (NORVASC) 5 MG tablet TAKE 1 TABLET BY MOUTH EVERY DAY Patient taking differently: Take 5 mg by mouth daily.  03/30/18  Yes LLendon Colonel NP  apixaban (ELIQUIS) 5 MG TABS tablet Take 1 tablet (5 mg total) by mouth 2 (two) times daily. 06/04/15  Yes Petrarca, BMike Craze PA-C  diazepam (VALIUM) 5 MG tablet Take 5 mg by mouth 2 (two) times daily.   Yes [provider]  escitalopram (LEXAPRO) 20 MG tablet Take 20 mg daily by mouth.  04/27/14  Yes [provider]  fenofibrate micronized (LOFIBRA) 134 MG capsule Take 134 mg by mouth at bedtime.   Yes [provider]  Fluticasone-Umeclidin-Vilant (TRELEGY ELLIPTA) 100-62.5-25 MCG/INH AEPB Inhale 1 puff daily into the lungs.    Yes [provider]  furosemide (LASIX) 80 MG tablet Take 80 mg by mouth daily. 04/22/15  Yes [provider]  Insulin Isophane & Regular Human (HUMULIN 70/30 MIX) (70-30) 100 UNIT/ML PEN Inject 50 Units into the skin 2 (two) times daily before a meal. 06/19/18  Yes Nida, GMarella Chimes MD  isosorbide mononitrate (IMDUR) 60 MG 24 hr tablet Take 1 tablet (60 mg total) by mouth 3 (three) times daily. Please make an appointment for additional refills Patient taking differently: Take 60 mg by mouth 2 (two) times daily. Please make an appointment for additional refills 03/21/15  Yes Croitoru, Mihai, MD  lisinopril (PRINIVIL,ZESTRIL) 5 MG tablet Take 5 mg by mouth daily.   Yes [provider]  meclizine (ANTIVERT) 25 MG tablet Take 25 mg by mouth 2 (two) times daily as needed for dizziness.   Yes [provider]  metoprolol tartrate (LOPRESSOR) 100 MG tablet TAKE 1 TABLET BY MOUTH TWICE A DAY Patient taking differently: Take 100 mg by mouth 2 (two) times daily.  03/10/18  Yes Branch,Alphonse Guild MD  modafinil (PROVIGIL) 100 MG tablet Take 100 mg by mouth daily.    Yes [provider]  nitroGLYCERIN (NITROLINGUAL) 0.4 MG/SPRAY  spray Place 1 spray under the tongue every 5 (five) minutes x 3 doses as needed for chest pain.   Yes [provider]  pantoprazole (PROTONIX) 40 MG tablet TAKE 1 TABLET BY MOUTH 30 MINUTES BEFORE BREAKFAST Patient taking differently: Take 40 mg by mouth daily before breakfast.  08/19/16  Yes BAnnitta Needs NP  potassium chloride SA (K-DUR,KLOR-CON) 20 MEQ tablet Take 1 tablet (20 mEq total) by mouth 2 (two) times daily. 07/22/14  Yes Croitoru, Mihai, MD  ranitidine (ZANTAC) 150 MG capsule Take 150 mg at bedtime by mouth.    Yes [provider]  sitaGLIPtin (JANUVIA) 50 MG tablet Take 1 tablet (50 mg total) by mouth daily. 06/05/18  Yes Nida, GMarella Chimes MD  traMADol (ULTRAM) 50 MG tablet Take 1 tablet (50 mg total) by mouth every 6 (six) hours as needed. 07/25/18  Yes SLajean Saver MD  umeclidinium-vilanterol (ANORO ELLIPTA) 62.5-25 MCG/INH AEPB Inhale 1 puff into the lungs every evening.  Yes [provider]  Vitamin D, Ergocalciferol, (DRISDOL) 50000 units CAPS capsule TAKE 1 CAPSULE BY MOUTH ONE TIME PER WEEK Patient taking differently: Take 50,000 Units by mouth every Monday.  05/02/18  Yes Nida, Marella Chimes, MD  Blood Glucose Monitoring Suppl (ACCU-CHEK GUIDE) w/Device KIT 1 each by Does not apply route 4 (four) times daily. 11/21/17   Cassandria Anger, MD  glucose blood (ACCU-CHEK GUIDE) test strip Use as instructed 4 x daily. E11.65 11/21/17   Cassandria Anger, MD  Insulin Pen Needle (PEN NEEDLES) 32G X 6 MM MISC 1 each by Does not apply route 4 (four) times daily. 11/21/17   Cassandria Anger, MD  LANCETS ULTRA FINE MISC 1 each by Does not apply route 4 (four) times daily. 11/21/17   Cassandria Anger, MD  ONETOUCH DELICA LANCETS FINE MISC 1 each by Does not apply route 4 (four) times daily. e11.65 02/14/18   Cassandria Anger, MD    Allergies as of 07/17/2018 - Review Complete 07/04/2018  Allergen Reaction Noted  . Contrast media  [iodinated diagnostic agents] Other (See Comments) 03/14/2013  . Iohexol Other (See Comments) 01/02/2008    Family History  Problem Relation Age of Onset  . Diabetes Mother   . Heart disease Mother   . Diabetes Father   . Heart disease Father   . Pancreatic cancer Brother        age 31, doing well  . Colon cancer Neg Hx   . Anesthesia problems Neg Hx   . Hypotension Neg Hx   . Malignant hyperthermia Neg Hx   . Pseudochol deficiency Neg Hx     Social History   Socioeconomic History  . Marital status: Divorced    Spouse name: Not on file  . Number of children: Not on file  . Years of education: Not on file  . Highest education level: Not on file  Occupational History  . Not on file  Social Needs  . Financial resource strain: Not on file  . Food insecurity:    Worry: Not on file    Inability: Not on file  . Transportation needs:    Medical: Not on file    Non-medical: Not on file  Tobacco Use  . Smoking status: Current Every Day Smoker    Packs/day: 0.50    Years: 30.00    Pack years: 15.00    Types: Cigarettes  . Smokeless tobacco: Never Used  Substance and Sexual Activity  . Alcohol use: Yes    Comment: occ  . Drug use: No  . Sexual activity: Never  Lifestyle  . Physical activity:    Days per week: Not on file    Minutes per session: Not on file  . Stress: Not on file  Relationships  . Social connections:    Talks on phone: Not on file    Gets together: Not on file    Attends religious service: Not on file    Active member of club or organization: Not on file    Attends meetings of clubs or organizations: Not on file    Relationship status: Not on file  . Intimate partner violence:    Fear of current or ex partner: Not on file    Emotionally abused: Not on file    Physically abused: Not on file    Forced sexual activity: Not on file  Other Topics Concern  . Not on file  Social History Narrative  . Not on file  Review of Systems: See HPI,  otherwise negative ROS   Physical Exam: BP 111/67 (BP Location: Left Arm)   Pulse 68   Temp 98.5 F (36.9 C)   Resp (!) 22   SpO2 92%  General:   Alert,  pleasant and cooperative in NAD Head:  Normocephalic and atraumatic. Neck:  Supple; Lungs:  Clear throughout to auscultation.    Heart:  Regular rate and rhythm. Abdomen:  Soft, nontender and nondistended. Normal bowel sounds, without guarding, and without rebound.   Neurologic:  Alert and  oriented x4;  grossly normal neurologically.  Impression/Plan:    SCREENING FOR BARRETT'S ESOPHAGUS.  PLAN:  1.EGD TODAY. DISCUSSED PROCEDURE, BENEFITS, & RISKS: < 1% chance of medication reaction, bleeding, OR perforation.

## 2018-08-01 NOTE — Discharge Instructions (Signed)
I dilated your esophagus. I DID NOT SEE A DEFINITE NARROWING IN YOUR esophagus. You have BARRETT'S ESOPHAGUS AND gastritis. I biopsied your ESOPHAGUS.    HOLD ELIQUIS. RE-START OCT 2.  CONTINUE PROTONIX.  YOUR BIOPSY RESULTS WILL BE BACK IN 5 BUSINESS DAYS.  FOLLOW UP IN 6 MOS.  NEXT UPPER ENDOSCOPY in 5 years.   UPPER ENDOSCOPY AFTER CARE Read the instructions outlined below and refer to this sheet in the next week. These discharge instructions provide you with general information on caring for yourself after you leave the hospital. While your treatment has been planned according to the most current medical practices available, unavoidable complications occasionally occur. If you have any problems or questions after discharge, call DR. Linnea Todisco, [phone].  ACTIVITY  You may resume your regular activity, but move at a slower pace for the next 24 hours.   Take frequent rest periods for the next 24 hours.   Walking will help get rid of the air and reduce the bloated feeling in your belly (abdomen).   No driving for 24 hours (because of the medicine (anesthesia) used during the test).   You may shower.   Do not sign any important legal documents or operate any machinery for 24 hours (because of the anesthesia used during the test).    NUTRITION  Drink plenty of fluids.   You may resume your normal diet as instructed by your doctor.   Begin with a light meal and progress to your normal diet. Heavy or fried foods are harder to digest and may make you feel sick to your stomach (nauseated).   Avoid alcoholic beverages for 24 hours or as instructed.    MEDICATIONS  You may resume your normal medications.   WHAT YOU CAN EXPECT TODAY  Some feelings of bloating in the abdomen.   Passage of more gas than usual.    IF YOU HAD A BIOPSY TAKEN DURING THE UPPER ENDOSCOPY:  Eat a soft diet IF YOU HAVE NAUSEA, BLOATING, ABDOMINAL PAIN, OR VOMITING.    FINDING OUT THE  RESULTS OF YOUR TEST Not all test results are available during your visit. DR. Oneida Alar WILL CALL YOU WITHIN 14 DAYS OF YOUR PROCEDUE WITH YOUR RESULTS. Do not assume everything is normal if you have not heard from DR. Mystique Bjelland, CALL HER OFFICE AT [phone].  SEEK IMMEDIATE MEDICAL ATTENTION AND CALL THE OFFICE: [phone] IF:  You have more than a spotting of blood in your stool.   Your belly is swollen (abdominal distention).   You are nauseated or vomiting.   You have a temperature over 101F.   You have abdominal pain or discomfort that is severe or gets worse throughout the day.  Gastritis  Gastritis is an inflammation (the body's way of reacting to injury and/or infection) of the stomach. It is often caused by viral or bacterial (germ) infections. It can also be caused BY ASPIRIN, BC/GOODY POWDER'S, (IBUPROFEN) MOTRIN, OR ALEVE (NAPROXEN), chemicals (including alcohol), SPICY FOODS, and medications. This illness may be associated with generalized malaise (feeling tired, not well), UPPER ABDOMINAL STOMACH cramps, and fever. One common bacterial cause of gastritis is an organism known as H. Pylori. This can be treated with antibiotics.    DYSPHAGIA DYSPHAGIA can be caused by stomach acid backing up into the tube that carries food from the mouth down to the stomach (lower esophagus).  TREATMENT There are a number of non-prescription medicines used to treat DYSPHAGIA including: Antacids.  Proton-pump inhibitors: PANTOPRAZOLE  HOME CARE  INSTRUCTIONS Eat 2-3 hours before going to bed.  Try to reach and maintain a healthy weight.  Do not eat just a few very large meals. Instead, eat 4 TO 6 smaller meals throughout the day.  Try to identify foods and beverages that make your symptoms worse, and avoid these.  Avoid tight clothing.  Do not exercise right after eating.

## 2018-08-01 NOTE — Transfer of Care (Signed)
Immediate Anesthesia Transfer of Care Note  Patient: [name]  Procedure(s) Performed: ESOPHAGOGASTRODUODENOSCOPY (EGD) WITH PROPOFOL (N/A ) SAVORY DILATION (N/A )  Patient Location: PACU  Anesthesia Type:MAC  Level of Consciousness: awake, alert , oriented and patient cooperative  Airway & Oxygen Therapy: Patient Spontanous Breathing and Patient connected to nasal cannula oxygen  Post-op Assessment: Report given to RN and Post -op Vital signs reviewed and stable  Post vital signs: Reviewed and stable  Last Vitals:  Vitals Value Taken Time  BP    Temp    Pulse 77 08/01/2018  9:21 AM  Resp 13 08/01/2018  9:21 AM  SpO2 91 % 08/01/2018  9:21 AM  Vitals shown include unvalidated device data.  Last Pain:  Vitals:   08/01/18 0730  PainSc: 0-No pain         Complications: No apparent anesthesia complications

## 2018-08-01 NOTE — Anesthesia Procedure Notes (Signed)
Procedure Name: [place] Performed by: Andree Elk Amy A, CRNA Pre-anesthesia Checklist: Patient identified, Emergency Drugs available, Suction available, Patient being monitored and Timeout performed Oxygen Delivery Method: Simple face mask

## 2018-08-01 NOTE — Anesthesia Postprocedure Evaluation (Signed)
Anesthesia Post Note  Patient: [name]  Procedure(s) Performed: ESOPHAGOGASTRODUODENOSCOPY (EGD) WITH PROPOFOL (N/A ) SAVORY DILATION (N/A )  Patient location during evaluation: PACU Anesthesia Type: MAC Level of consciousness: awake and alert and oriented Pain management: pain level controlled Vital Signs Assessment: post-procedure vital signs reviewed and stable Respiratory status: spontaneous breathing Cardiovascular status: stable Postop Assessment: no apparent nausea or vomiting Anesthetic complications: no     Last Vitals:  Vitals:   08/01/18 0730  BP: 111/67  Pulse: 68  Resp: (!) 22  Temp: 36.9 C  SpO2: 92%    Last Pain:  Vitals:   08/01/18 0730  PainSc: 0-No pain                 Dreon Pineda A

## 2018-08-01 NOTE — Anesthesia Preprocedure Evaluation (Signed)
Anesthesia Evaluation  Patient identified by MRN, date of birth, ID band Patient awake    Reviewed: Allergy & Precautions, H&P , NPO status , Patient's Chart, lab work & pertinent test results, reviewed documented beta blocker date and time   Airway Mallampati: III  TM Distance: >3 FB Neck ROM: full    Dental no notable dental hx. (+) Dental Advidsory Given   Pulmonary neg pulmonary ROS, sleep apnea , COPD,  COPD inhaler, Current Smoker,    Pulmonary exam normal breath sounds clear to auscultation       Cardiovascular Exercise Tolerance: Good hypertension, + angina + CAD, + Past MI and +CHF  negative cardio ROS  + dysrhythmias  Rhythm:regular Rate:Normal     Neuro/Psych PSYCHIATRIC DISORDERS Depression CVA negative neurological ROS  negative psych ROS   GI/Hepatic negative GI ROS, Neg liver ROS, GERD  ,  Endo/Other  negative endocrine ROSdiabetes  Renal/GU Renal diseasenegative Renal ROS  negative genitourinary   Musculoskeletal   Abdominal   Peds  Hematology negative hematology ROS (+)   Anesthesia Other Findings Morbid obesity, DM, CAD s/p CABG, COPD  Reproductive/Obstetrics negative OB ROS                             Anesthesia Physical Anesthesia Plan  ASA: IV  Anesthesia Plan: MAC   Post-op Pain Management:    Induction:   PONV Risk Score and Plan:   Airway Management Planned:   Additional Equipment:   Intra-op Plan:   Post-operative Plan:   Informed Consent: I have reviewed the patients History and Physical, chart, labs and discussed the procedure including the risks, benefits and alternatives for the proposed anesthesia with the patient or authorized representative who has indicated his/her understanding and acceptance.   Dental Advisory Given  Plan Discussed with: CRNA and Anesthesiologist  Anesthesia Plan Comments:         Anesthesia Quick Evaluation

## 2018-08-01 NOTE — Op Note (Addendum)
[facility] Patient Name: [name] Procedure Date: 08/01/2018 8:51 AM MRN: [id number] Date of Birth: 09/23/1961 Attending MD: Barney Drain MD, MD CSN: [id number] Age: 57 Admit Type: Outpatient Procedure:                Upper GI endoscopy WITH ESOPHAGEAL                            DILATION/ESOPHAGEAL BIOPSY Indications:              Dysphagia, Surveillance for malignancy due to                            personal history of Barrett's esophagus Providers:                Barney Drain MD, MD, Lurline Del, RN, Nelma Rothman,                            Technician Referring MD:             Halford Chessman MD, MD Medicines:                Propofol per Anesthesia Complications:            No immediate complications. Estimated Blood Loss:     Estimated blood loss was minimal. Procedure:                Pre-Anesthesia Assessment:                           - Prior to the procedure, a History and Physical                            was performed, and patient medications and                            allergies were reviewed. The patient's tolerance of                            previous anesthesia was also reviewed. The risks                            and benefits of the procedure and the sedation                            options and risks were discussed with the patient.                            All questions were answered, and informed consent                            was obtained. Prior Anticoagulants: The patient has                            taken heparin, last dose was 7 days prior to  procedure. ASA Grade Assessment: II - A patient                            with mild systemic disease. After reviewing the                            risks and benefits, the patient was deemed in                            satisfactory condition to undergo the procedure.                           After obtaining informed consent, the endoscope was        passed under direct vision. Throughout the                            procedure, the patient's blood pressure, pulse, and                            oxygen saturations were monitored continuously. The                            [id number]-[id number] ([id number]) scope was introduced through the                            mouth, and advanced to the second part of duodenum.                            The patient tolerated the procedure well. The upper                            GI endoscopy was accomplished without difficulty. Scope In: 9:02:30 AM Scope Out: 9:09:39 AM Total Procedure Duration: 0 hours 7 minutes 9 seconds  Findings:      No endoscopic abnormality was evident in the esophagus to explain the       patient's complaint of dysphagia. It was decided, however, to proceed       with dilation of the entire esophagus due to POSSIBLE ESOPHAGEAL WEB. A       guidewire was placed and the scope was withdrawn. Dilation was performed       with a Savary dilator with mild resistance at 15 mm and 16 mm. This was       biopsied with a cold forceps for histology.      Patchy mild inflammation characterized by congestion (edema) and       erythema was found in the entire examined stomach.      The examined duodenum was normal. Impression:               - No endoscopic esophageal abnormality to explain                            patient's dysphagia. Esophagus dilated. Dilated.                           - MILD  Gastritis. Moderate Sedation:      Per Anesthesia Care Recommendation:           - Patient has a contact number available for                            emergencies. The signs and symptoms of potential                            delayed complications were discussed with the                            patient. Return to normal activities tomorrow.                            Written discharge instructions were provided to the                            patient.                           - High fiber  diet and low fat diet.                           - Continue present medications.                           - Await pathology results.                           - Return to my office in 6 months.                           - Repeat upper endoscopy in 5 years for                            surveillance. Procedure Code(s):        --- Professional ---                           [phone], Esophagogastroduodenoscopy, flexible,                            transoral; with insertion of guide wire followed by                            passage of dilator(s) through esophagus over guide                            wire                           [id number], Esophagogastroduodenoscopy, flexible,                            transoral; with biopsy, single or multiple Diagnosis Code(s):        --- Professional ---  R13.10, Dysphagia, unspecified                           K29.70, Gastritis, unspecified, without bleeding                           K22.70, Barrett's esophagus without dysplasia CPT copyright 2017 American Medical Association. All rights reserved. The codes documented in this report are preliminary and upon coder review may  be revised to meet current compliance requirements. Barney Drain, MD Barney Drain MD, MD 08/01/2018 9:27:48 AM This report has been signed electronically. Number of Addenda: 0

## 2018-08-03 ENCOUNTER — Encounter (HOSPITAL_COMMUNITY): Payer: Self-pay | Admitting: Gastroenterology

## 2018-08-04 ENCOUNTER — Telehealth: Payer: Self-pay | Admitting: Gastroenterology

## 2018-08-04 NOTE — Telephone Encounter (Signed)
Please call pt. The biopsies show Barrett's. Repeat EGD in 5 years.    YOU SHOULD HAVE RE-STARTED ELIQUIS ON OCT 2.  CONTINUE PROTONIX.  FOLLOW UP IN 6 MOS.

## 2018-08-07 NOTE — Telephone Encounter (Signed)
PATIENT SCHEDULED  °

## 2018-08-07 NOTE — Telephone Encounter (Signed)
Lmom, waiting on a return call.  

## 2018-08-08 ENCOUNTER — Telehealth: Payer: Self-pay | Admitting: Internal Medicine

## 2018-08-08 NOTE — Telephone Encounter (Signed)
Pt was returning a call. Please call [phone]

## 2018-08-08 NOTE — Telephone Encounter (Signed)
Pt notified of results. Pt wants to cancel the December appointment and push it out to 6 months.

## 2018-08-09 NOTE — Telephone Encounter (Signed)
OV cancelled and reminder in epic to FU in 6 months

## 2018-08-18 ENCOUNTER — Other Ambulatory Visit (HOSPITAL_COMMUNITY): Payer: Self-pay | Admitting: Pulmonary Disease

## 2018-08-18 DIAGNOSIS — J984 Other disorders of lung: Secondary | ICD-10-CM

## 2018-08-21 ENCOUNTER — Encounter: Payer: Self-pay | Admitting: "Endocrinology

## 2018-08-21 ENCOUNTER — Ambulatory Visit (INDEPENDENT_AMBULATORY_CARE_PROVIDER_SITE_OTHER): Payer: Medicare HMO | Admitting: "Endocrinology

## 2018-08-21 VITALS — BP 104/71 | HR 81 | Ht 70.0 in | Wt 286.0 lb

## 2018-08-21 DIAGNOSIS — I10 Essential (primary) hypertension: Secondary | ICD-10-CM

## 2018-08-21 DIAGNOSIS — E782 Mixed hyperlipidemia: Secondary | ICD-10-CM

## 2018-08-21 DIAGNOSIS — F17200 Nicotine dependence, unspecified, uncomplicated: Secondary | ICD-10-CM

## 2018-08-21 DIAGNOSIS — E1159 Type 2 diabetes mellitus with other circulatory complications: Secondary | ICD-10-CM | POA: Diagnosis not present

## 2018-08-21 MED ORDER — INSULIN ISOPHANE & REGULAR (HUMAN 70-30)100 UNIT/ML KWIKPEN: 60.0000 [IU] | PEN_INJECTOR | Freq: Two times a day (BID) | SUBCUTANEOUS | 2 refills | Status: DC

## 2018-08-21 NOTE — Progress Notes (Signed)
Endocrinology follow-up note   Subjective:    Patient ID: [name], male    DOB: 1961/04/28.  he is being seen in follow-up for management of currently uncontrolled symptomatic type 2 diabetes, hypertension, hyperlipidemia, obesity. PMD:  Sharilyn Sites, MD.   Past Medical History:  Diagnosis Date  . Abnormal myocardial perfusion study 01/01/2011   there a small to moderate sized inferobasal scar  . Barrett's esophagus   . Cataracts, bilateral   . Chronic kidney disease    hx of kidney stones  . Claudication ([place]) 11/16/2011   PV test perform shows normal  . COPD (chronic obstructive pulmonary disease) ([place])   . Depression   . Diabetes ([place])    type 2 diabetes mellitus  . Dysrhythmia   . GERD (gastroesophageal reflux disease)   . Glaucoma   . Hernia of abdominal wall   . HTN (hypertension)   . Hyperlipidemia   . Morbid obesity ([place])   . Myocardial infarction ([place]) 2008,2009,2009  . OSA (obstructive sleep apnea)    on cpap  . PAF (paroxysmal atrial fibrillation) ([place])   . S/P colonoscopy 2009   3-4 mm transverse colon erosions likely secondary to  ASA  . S/P endoscopy Dec 2011   moderate erosive gastritis, Barrett's esophagus 1-2cm  . Sleep apnea   . SOB (shortness of breath) 11/03/2007   2D Echo EF 50%-55%  . Stroke [facility])    Past Surgical History:  Procedure Laterality Date  . BIOPSY N/A 04/29/2015   Procedure: BIOPSY;  Surgeon: Danie Binder, MD;  Location: [facility];  Service: Endoscopy;  Laterality: N/A;  . BIOPSY  08/01/2018   Procedure: BIOPSY;  Surgeon: Danie Binder, MD;  Location: [facility];  Service: Endoscopy;;  esophageal  . CABG X 4  03/2008  . CARDIAC CATHETERIZATION  2009   stent placement to the left circumflex a 2.25   . CARDIAC CATHETERIZATION  07/08/2010  . COLONOSCOPY WITH PROPOFOL N/A 09/27/2017   Procedure: COLONOSCOPY WITH PROPOFOL;  Surgeon: Danie Binder, MD;  Location: [facility];  Service: Endoscopy;  Laterality: N/A;   12:00pm  . CORONARY ARTERY BYPASS GRAFT  2008   4 vessels  . CORONARY STENT PLACEMENT  12/29/12  . CORONARY STENT PLACEMENT  12/2012  . ESOPHAGEAL DILATION N/A 04/29/2015   Procedure: ESOPHAGEAL DILATION 15 mm, 16 mm;  Surgeon: Danie Binder, MD;  Location: [facility];  Service: Endoscopy;  Laterality: N/A;  . ESOPHAGOGASTRODUODENOSCOPY  09/2011   Barrett's esophagus, no dysplasia on biopsies. Distal esophagitis. Status post dilation. Moderate gastritis and duodenitis, but biopsies benign. Next EGD in November 2015 for surveillance of Barrett's esophagus.  . ESOPHAGOGASTRODUODENOSCOPY (EGD) WITH PROPOFOL N/A 04/29/2015   SLF: 1. Barretts esophagus 2. Moderate non-erosive gastritis.   Marland Kitchen ESOPHAGOGASTRODUODENOSCOPY (EGD) WITH PROPOFOL N/A 08/01/2018   Procedure: ESOPHAGOGASTRODUODENOSCOPY (EGD) WITH PROPOFOL;  Surgeon: Danie Binder, MD;  [facility];  Service: Endoscopy;  Laterality: N/A;  2:30pm  . HERNIA REPAIR     ventral hernia repair  . LEFT HEART CATHETERIZATION WITH CORONARY/GRAFT ANGIOGRAM N/A 12/29/2012   Procedure: LEFT HEART CATHETERIZATION WITH Beatrix Fetters;  Surgeon: Troy Sine, MD;  Location[facility] CATH LAB;  Service: Cardiovascular;  Laterality: N/A;  . ORIF FIBULA FRACTURE Right 06/03/2015   Procedure: OPEN REDUCTION INTERNAL FIXATION (ORIF) DISTAL FIBULA  FRACTURE;  Surgeon: Garald Balding, MD;  Location: [place];  Service: Orthopedics;  Laterality: Right;  . PERCUTANEOUS CORONARY STENT INTERVENTION (PCI-S)  12/29/2012   Procedure: PERCUTANEOUS CORONARY STENT INTERVENTION (PCI-S);  Surgeon: Troy Sine, MD;  Location: [facility] CATH LAB;  Service: Cardiovascular;;  . POLYPECTOMY  09/27/2017   Procedure: POLYPECTOMY;  Surgeon: Danie Binder, MD;  [facility];  Service: Endoscopy;;  cecal polyp, ascending polyps x6, transverse colon polyps x6, splenic flexure polyps x2, descending colon polyps x6, sigmoid  colon polyp x1, rectal polyp x1   . SAVORY  DILATION  09/06/2011   Procedure: SAVORY DILATION;  Surgeon: Dorothyann Peng, MD;  Location: [facility];  Service: Endoscopy;  Laterality: N/A;  Dilated with 42m  . SAVORY DILATION N/A 08/01/2018   Procedure: SAVORY DILATION;  Surgeon: FDanie Binder MD;  [facility];  Service: Endoscopy;  Laterality: N/A;   Social History   Socioeconomic History  . Marital status: Divorced    Spouse name: Not on file  . Number of children: Not on file  . Years of education: Not on file  . Highest education level: Not on file  Occupational History  . Not on file  Social Needs  . Financial resource strain: Not on file  . Food insecurity:    Worry: Not on file    Inability: Not on file  . Transportation needs:    Medical: Not on file    Non-medical: Not on file  Tobacco Use  . Smoking status: Current Every Day Smoker    Packs/day: 0.50    Years: 30.00    Pack years: 15.00    Types: Cigarettes  . Smokeless tobacco: Never Used  Substance and Sexual Activity  . Alcohol use: Yes    Comment: occ  . Drug use: No  . Sexual activity: Never  Lifestyle  . Physical activity:    Days per week: Not on file    Minutes per session: Not on file  . Stress: Not on file  Relationships  . Social connections:    Talks on phone: Not on file    Gets together: Not on file    Attends religious service: Not on file    Active member of club or organization: Not on file    Attends meetings of clubs or organizations: Not on file    Relationship status: Not on file  Other Topics Concern  . Not on file  Social History Narrative  . Not on file   Outpatient Encounter Medications as of 08/21/2018  Medication Sig  . albuterol (PROVENTIL HFA;VENTOLIN HFA) 108 (90 Base) MCG/ACT inhaler Inhale 2 puffs into the lungs every 4 (four) hours as needed for wheezing or shortness of breath.  .Marland KitchenamLODipine (NORVASC) 5 MG tablet TAKE 1 TABLET BY MOUTH EVERY DAY (Patient taking differently: Take 5 mg by mouth daily. )   . apixaban (ELIQUIS) 5 MG TABS tablet Take 1 tablet (5 mg total) by mouth 2 (two) times daily.  . Blood Glucose Monitoring Suppl (ACCU-CHEK GUIDE) w/Device KIT 1 each by Does not apply route 4 (four) times daily.  . diazepam (VALIUM) 5 MG tablet Take 5 mg by mouth 2 (two) times daily.  .Marland Kitchenescitalopram (LEXAPRO) 20 MG tablet Take 20 mg daily by mouth.   . fenofibrate micronized (LOFIBRA) 134 MG capsule Take 134 mg by mouth at bedtime.  . Fluticasone-Umeclidin-Vilant (TRELEGY ELLIPTA) 100-62.5-25 MCG/INH AEPB Inhale 1 puff daily into the lungs.   . furosemide (LASIX) 80 MG tablet Take 80 mg by mouth daily.  .Marland Kitchenglucose blood (ACCU-CHEK GUIDE) test strip Use as instructed 4  x daily. E11.65  . Insulin Isophane & Regular Human (HUMULIN 70/30 MIX) (70-30) 100 UNIT/ML PEN Inject 60 Units into the skin 2 (two) times daily before a meal.  . Insulin Pen Needle (PEN NEEDLES) 32G X 6 MM MISC 1 each by Does not apply route 4 (four) times daily.  . isosorbide mononitrate (IMDUR) 60 MG 24 hr tablet Take 1 tablet (60 mg total) by mouth 3 (three) times daily. Please make an appointment for additional refills (Patient taking differently: Take 60 mg by mouth 2 (two) times daily. Please make an appointment for additional refills)  . LANCETS ULTRA FINE MISC 1 each by Does not apply route 4 (four) times daily.  Marland Kitchen lisinopril (PRINIVIL,ZESTRIL) 5 MG tablet Take 5 mg by mouth daily.  . meclizine (ANTIVERT) 25 MG tablet Take 25 mg by mouth 2 (two) times daily as needed for dizziness.  . metoprolol tartrate (LOPRESSOR) 100 MG tablet TAKE 1 TABLET BY MOUTH TWICE A DAY (Patient taking differently: Take 100 mg by mouth 2 (two) times daily. )  . modafinil (PROVIGIL) 100 MG tablet Take 100 mg by mouth daily.   . nitroGLYCERIN (NITROLINGUAL) 0.4 MG/SPRAY spray Place 1 spray under the tongue every 5 (five) minutes x 3 doses as needed for chest pain.  Glory Rosebush DELICA LANCETS FINE MISC 1 each by Does not apply route 4 (four) times  daily. e11.65  . pantoprazole (PROTONIX) 40 MG tablet TAKE 1 TABLET BY MOUTH 30 MINUTES BEFORE BREAKFAST (Patient taking differently: Take 40 mg by mouth daily before breakfast. )  . potassium chloride SA (K-DUR,KLOR-CON) 20 MEQ tablet Take 1 tablet (20 mEq total) by mouth 2 (two) times daily.  . ranitidine (ZANTAC) 150 MG capsule Take 150 mg at bedtime by mouth.   . sitaGLIPtin (JANUVIA) 50 MG tablet Take 1 tablet (50 mg total) by mouth daily.  . traMADol (ULTRAM) 50 MG tablet Take 1 tablet (50 mg total) by mouth every 6 (six) hours as needed.  . umeclidinium-vilanterol (ANORO ELLIPTA) 62.5-25 MCG/INH AEPB Inhale 1 puff into the lungs every evening.   . Vitamin D, Ergocalciferol, (DRISDOL) 50000 units CAPS capsule TAKE 1 CAPSULE BY MOUTH ONE TIME PER WEEK (Patient taking differently: Take 50,000 Units by mouth every Monday. )  . [DISCONTINUED] Insulin Isophane & Regular Human (HUMULIN 70/30 MIX) (70-30) 100 UNIT/ML PEN Inject 50 Units into the skin 2 (two) times daily before a meal.   No facility-administered encounter medications on file as of 08/21/2018.     ALLERGIES: Allergies  Allergen Reactions  . Contrast Media [Iodinated Diagnostic Agents] Other (See Comments)    Pt must be premedicated before given contrast media - stops heart  . Iohexol Other (See Comments)     Consult with radiologist before pre meds are given.Desc: PT. STATES HEART STOPPED HAS TO BE PREMED.     VACCINATION STATUS:  There is no immunization history on file for this patient.  Diabetes  He presents for his follow-up diabetic visit. He has type 2 diabetes mellitus. Onset time: He was diagnosed at approximate age of 62 years. His disease course has been worsening. There are no hypoglycemic associated symptoms. Pertinent negatives for hypoglycemia include no confusion, headaches, pallor or seizures. Associated symptoms include polydipsia and polyuria. Pertinent negatives for diabetes include no blurred vision, no  chest pain, no fatigue, no polyphagia and no weakness. There are no hypoglycemic complications. Symptoms are improving. Diabetic complications include a CVA and heart disease. Risk factors for coronary artery disease include  diabetes mellitus, dyslipidemia, hypertension, male sex, obesity, tobacco exposure, sedentary lifestyle and family history. Current diabetic treatments: He was supposed to be on basal insulin, unfortunately he ran out and could not afford refills and he did not call clinic for alternatives. He is compliant with treatment none of the time. His weight is decreasing steadily. He is following a generally unhealthy diet. When asked about meal planning, he reported none. He has not had a previous visit with a dietitian. He never participates in exercise. His home blood glucose trend is increasing steadily. His breakfast blood glucose range is generally 180-200 mg/dl. His dinner blood glucose range is generally >200 mg/dl. His overall blood glucose range is 180-200 mg/dl. (She comes in with no meter nor logs to review.  His A1c is 10.4% increasing from  7.6%.) An ACE inhibitor/angiotensin II receptor blocker is being taken. He does not see a podiatrist.Eye exam is not current.  Hyperlipidemia  This is a chronic problem. The current episode started more than 1 year ago. The problem is uncontrolled. Recent lipid tests were reviewed and are high. Exacerbating diseases include diabetes and obesity. Pertinent negatives include no chest pain, myalgias or shortness of breath. Current antihyperlipidemic treatment includes statins. Compliance problems include medication cost and psychosocial issues.  Risk factors for coronary artery disease include dyslipidemia, diabetes mellitus, hypertension, male sex, obesity, family history and a sedentary lifestyle.  Hypertension  This is a chronic problem. The current episode started more than 1 year ago. The problem is uncontrolled. Pertinent negatives include no  blurred vision, chest pain, headaches, neck pain, palpitations or shortness of breath. Risk factors for coronary artery disease include diabetes mellitus, dyslipidemia, male gender, obesity, family history, sedentary lifestyle and smoking/tobacco exposure. Past treatments include ACE inhibitors. Hypertensive end-organ damage includes CVA.    Review of Systems  Constitutional: Negative for chills, fatigue, fever and unexpected weight change.  HENT: Negative for dental problem, mouth sores and trouble swallowing.   Eyes: Negative for blurred vision and visual disturbance.  Respiratory: Negative for cough, choking, chest tightness, shortness of breath and wheezing.   Cardiovascular: Negative for chest pain, palpitations and leg swelling.  Gastrointestinal: Negative for abdominal distention, abdominal pain, constipation, diarrhea, nausea and vomiting.  Endocrine: Positive for polydipsia and polyuria. Negative for polyphagia.  Genitourinary: Negative for dysuria, flank pain, hematuria and urgency.  Musculoskeletal: Negative for back pain, gait problem, myalgias and neck pain.  Skin: Negative for pallor, rash and wound.  Neurological: Negative for seizures, syncope, weakness, numbness and headaches.  Psychiatric/Behavioral: Negative.  Negative for confusion and dysphoric mood.    Objective:    BP 104/71   Pulse 81   Ht '5\' 10"'  (1.778 m)   Wt 286 lb (129.7 kg)   BMI 41.04 kg/m   Wt Readings from Last 3 Encounters:  08/21/18 286 lb (129.7 kg)  07/26/18 300 lb (136.1 kg)  07/25/18 300 lb (136.1 kg)     Physical Exam  Constitutional: He is oriented to person, place, and time. He appears well-developed. He is cooperative. No distress.  Poor personal hygiene.  HENT:  Head: Normocephalic and atraumatic.  Eyes: EOM are normal.  Neck: Normal range of motion. Neck supple. No tracheal deviation present. No thyromegaly present.  Cardiovascular: Normal rate, S1 normal and S2 normal. Exam reveals  no gallop.  No murmur heard. Pulses:      Dorsalis pedis pulses are 1+ on the right side, and 1+ on the left side.       Posterior  tibial pulses are 1+ on the right side, and 1+ on the left side.  Pulmonary/Chest: Effort normal. No respiratory distress. He has no wheezes.  Abdominal: Bowel sounds are normal. He exhibits no distension. There is no tenderness. There is no guarding and no CVA tenderness.  Musculoskeletal: He exhibits no edema.       Right shoulder: He exhibits no swelling and no deformity.  Neurological: He is alert and oriented to person, place, and time. He has normal strength and normal reflexes. No cranial nerve deficit or sensory deficit. Gait normal.  Skin: Skin is warm and dry. No rash noted. No cyanosis. Nails show no clubbing.  Psychiatric: His speech is normal. Cognition and memory are normal.  Reluctant affect. Unconcerned attitude.   CMP ( most recent) CMP     Component Value Date/Time   NA 140 07/31/2018 1137   K 4.0 07/31/2018 1137   CL 99 07/31/2018 1137   CO2 23 07/31/2018 1137   GLUCOSE 274 (H) 07/31/2018 1137   GLUCOSE 314 (H) 07/25/2018 1000   BUN 11 07/31/2018 1137   CREATININE 0.87 07/31/2018 1137   CALCIUM 9.6 07/31/2018 1137   PROT 6.7 07/31/2018 1137   ALBUMIN 4.1 07/31/2018 1137   AST 42 (H) 07/31/2018 1137   ALT 67 (H) 07/31/2018 1137   ALKPHOS 104 07/31/2018 1137   BILITOT 0.6 07/31/2018 1137   GFRNONAA 97 07/31/2018 1137   GFRAA 112 07/31/2018 1137   Diabetic Labs (most recent): Lab Results  Component Value Date   HGBA1C 8.9 (H) 07/31/2018   HGBA1C 10.4 (H) 05/15/2018   HGBA1C 7.6 (H) 02/23/2018     Lipid Panel ( most recent) Lipid Panel     Component Value Date/Time   CHOL 137 11/18/2017 0823   TRIG 469 (H) 11/18/2017 0823   HDL 28 (L) 11/18/2017 0823   CHOLHDL 6.1 04/28/2014 0121   VLDL 61 (H) 04/28/2014 0121   LDLCALC Comment 11/18/2017 0823     Assessment & Plan:   1. DM type 2 causing vascular disease ([place])  -  Patient has currently uncontrolled symptomatic type 2 DM since  57 years of age. -Patient returns with better engagement in monitoring. -His previsit labs show A1c of 8.9% improving from 10.4%.  -his diabetes is complicated by coronary artery disease, CVA, obesity/sedentary life, chronic heavy smoking and [name] remains at extremely high risk  for more acute and chronic complications which include CAD, CVA, CKD, retinopathy, and neuropathy. These are all discussed in detail with the patient.  - I have counseled him on diet management and weight loss, by adopting a carbohydrate restricted/protein rich diet.  -  Suggestion is made for him to avoid simple carbohydrates  from his diet including Cakes, Sweet Desserts / Pastries, Ice Cream, Soda (diet and regular), Sweet Tea, Candies, Chips, Cookies, Store Bought Juices, Alcohol in Excess of  1-2 drinks a day, Artificial Sweeteners, and "Sugar-free" Products. This will help patient to have stable blood glucose profile and potentially avoid unintended weight gain.   - I encouraged him to switch to  unprocessed or minimally processed complex starch and increased protein intake (animal or plant source), fruits, and vegetables.  - he is advised to stick to a routine mealtimes to eat 3 meals  a day and avoid unnecessary snacks ( to snack only to correct hypoglycemia).   - I have approached him with the following individualized plan to manage diabetes and patient agrees:   - He has significant cognitive  deficit, for simplicity reasons he will be kept on premixed insulin.  - Besides, he could not afford the co-pays for insulin analogs.    -He is advised to increase  Novolin 70/30, to 60 units with breakfast and 60 units with supper  for pre-meal glucose readings above 90 mg/dL.   -He is advised to continue Januvia 50 mg p.o. daily with breakfast.    -He does not tolerate metformin due to GI side effects.   -Patient is encouraged to call  clinic for blood glucose levels less than 70 or above 300 mg /dl.  - Patient specific target  A1c;  LDL, HDL, Triglycerides, and  Waist Circumference were discussed in detail.  2) BP/HTN:   His blood pressure is controlled to target.  He continues to smoke heavily, he is counseled extensively against smoking.  He is advised to continue his current blood pressure medications consistently including lisinopril 5 mg p.o. daily.   3) Lipids/HPL: His  lipid panel is not controlled  with severe hypertriglyceridemia   of  469.  He is advised to continue Trilipix 135 mg p.o. nightly.    4)  Weight/Diet: CDE Consult has been  initiated , exercise, and detailed carbohydrates information provided.  5) Chronic Care/Health Maintenance:  -he  is on ACEI/ARB and Statin medications and  is encouraged to continue to follow up with Ophthalmology, Dentist,  Podiatrist at least yearly or according to recommendations, and advised to  quit smoking. I have recommended yearly flu vaccine and pneumonia vaccination at least every 5 years; moderate intensity exercise for up to 150 minutes weekly; and  sleep for at least 7 hours a day.  - I advised patient to maintain close follow up with Sharilyn Sites, MD for primary care needs.  - Time spent with the patient: 25 min, of which >50% was spent in reviewing his blood glucose logs , discussing his hypo- and hyper-glycemic episodes, reviewing his current and  previous labs and insulin doses and developing a plan to avoid hypo- and hyper-glycemia. Please refer to Patient Instructions for Blood Glucose Monitoring and Insulin/Medications Dosing Guide"  in media tab for additional information. [name] participated in the discussions, expressed understanding, and voiced agreement with the above plans.  All questions were answered to his satisfaction. he is encouraged to contact clinic should he have any questions or concerns prior to his return visit.   Follow up plan: -  Return in about 4 months (around 12/22/2018) for Meter, and Logs.   Glade Lloyd, MD Phone: [phone]  Fax: [phone]   08/21/2018, 1:44 PM   This note was partially dictated with voice recognition software. Similar sounding words can be transcribed inadequately or may not  be corrected upon review.

## 2018-08-21 NOTE — Patient Instructions (Signed)

## 2018-09-06 ENCOUNTER — Ambulatory Visit (HOSPITAL_COMMUNITY)
Admission: RE | Admit: 2018-09-06 | Discharge: 2018-09-06 | Disposition: A | Discharge status: Home / Self Care | Payer: Medicare HMO | Source: Ambulatory Visit | Attending: Pulmonary Disease | Admitting: Pulmonary Disease

## 2018-09-06 DIAGNOSIS — R918 Other nonspecific abnormal finding of lung field: Secondary | ICD-10-CM | POA: Diagnosis not present

## 2018-09-06 DIAGNOSIS — Z951 Presence of aortocoronary bypass graft: Secondary | ICD-10-CM | POA: Insufficient documentation

## 2018-09-06 DIAGNOSIS — J439 Emphysema, unspecified: Secondary | ICD-10-CM | POA: Insufficient documentation

## 2018-09-06 DIAGNOSIS — J984 Other disorders of lung: Secondary | ICD-10-CM

## 2018-09-25 DIAGNOSIS — I2510 Atherosclerotic heart disease of native coronary artery without angina pectoris: Secondary | ICD-10-CM | POA: Diagnosis not present

## 2018-09-25 DIAGNOSIS — Z0000 Encounter for general adult medical examination without abnormal findings: Secondary | ICD-10-CM | POA: Diagnosis not present

## 2018-09-25 DIAGNOSIS — Z23 Encounter for immunization: Secondary | ICD-10-CM | POA: Diagnosis not present

## 2018-09-25 DIAGNOSIS — Z6841 Body Mass Index (BMI) 40.0 and over, adult: Secondary | ICD-10-CM | POA: Diagnosis not present

## 2018-09-25 DIAGNOSIS — Z1389 Encounter for screening for other disorder: Secondary | ICD-10-CM | POA: Diagnosis not present

## 2018-09-25 DIAGNOSIS — G4733 Obstructive sleep apnea (adult) (pediatric): Secondary | ICD-10-CM | POA: Diagnosis not present

## 2018-09-25 DIAGNOSIS — E782 Mixed hyperlipidemia: Secondary | ICD-10-CM | POA: Diagnosis not present

## 2018-09-25 DIAGNOSIS — E7849 Other hyperlipidemia: Secondary | ICD-10-CM | POA: Diagnosis not present

## 2018-09-25 DIAGNOSIS — Z125 Encounter for screening for malignant neoplasm of prostate: Secondary | ICD-10-CM | POA: Diagnosis not present

## 2018-09-25 DIAGNOSIS — E1165 Type 2 diabetes mellitus with hyperglycemia: Secondary | ICD-10-CM | POA: Diagnosis not present

## 2018-09-25 DIAGNOSIS — E119 Type 2 diabetes mellitus without complications: Secondary | ICD-10-CM | POA: Diagnosis not present

## 2018-09-25 DIAGNOSIS — E785 Hyperlipidemia, unspecified: Secondary | ICD-10-CM | POA: Diagnosis not present

## 2018-09-25 DIAGNOSIS — E1151 Type 2 diabetes mellitus with diabetic peripheral angiopathy without gangrene: Secondary | ICD-10-CM | POA: Diagnosis not present

## 2018-10-10 DIAGNOSIS — J449 Chronic obstructive pulmonary disease, unspecified: Secondary | ICD-10-CM | POA: Diagnosis not present

## 2018-10-10 DIAGNOSIS — J9611 Chronic respiratory failure with hypoxia: Secondary | ICD-10-CM | POA: Diagnosis not present

## 2018-10-10 DIAGNOSIS — R918 Other nonspecific abnormal finding of lung field: Secondary | ICD-10-CM | POA: Diagnosis not present

## 2018-10-10 DIAGNOSIS — G4733 Obstructive sleep apnea (adult) (pediatric): Secondary | ICD-10-CM | POA: Diagnosis not present

## 2018-10-11 ENCOUNTER — Ambulatory Visit: Payer: Medicare HMO | Admitting: Nurse Practitioner

## 2018-11-08 ENCOUNTER — Other Ambulatory Visit: Payer: Self-pay

## 2018-11-08 MED ORDER — INSULIN ISOPHANE & REGULAR (HUMAN 70-30)100 UNIT/ML KWIKPEN: 60.0000 [IU] | PEN_INJECTOR | Freq: Two times a day (BID) | SUBCUTANEOUS | 0 refills | Status: DC

## 2018-11-22 DIAGNOSIS — G4733 Obstructive sleep apnea (adult) (pediatric): Secondary | ICD-10-CM | POA: Diagnosis not present

## 2018-11-22 DIAGNOSIS — G2581 Restless legs syndrome: Secondary | ICD-10-CM | POA: Diagnosis not present

## 2018-11-23 DIAGNOSIS — G4714 Hypersomnia due to medical condition: Secondary | ICD-10-CM | POA: Diagnosis not present

## 2018-11-23 DIAGNOSIS — Z79899 Other long term (current) drug therapy: Secondary | ICD-10-CM | POA: Diagnosis not present

## 2018-11-23 DIAGNOSIS — E662 Morbid (severe) obesity with alveolar hypoventilation: Secondary | ICD-10-CM | POA: Diagnosis not present

## 2018-11-23 DIAGNOSIS — G4733 Obstructive sleep apnea (adult) (pediatric): Secondary | ICD-10-CM | POA: Diagnosis not present

## 2018-12-01 ENCOUNTER — Other Ambulatory Visit: Payer: Self-pay | Admitting: "Endocrinology

## 2018-12-01 DIAGNOSIS — E1151 Type 2 diabetes mellitus with diabetic peripheral angiopathy without gangrene: Secondary | ICD-10-CM | POA: Diagnosis not present

## 2018-12-01 DIAGNOSIS — E1159 Type 2 diabetes mellitus with other circulatory complications: Secondary | ICD-10-CM | POA: Diagnosis not present

## 2018-12-02 LAB — COMPREHENSIVE METABOLIC PANEL
ALT: 45 IU/L — ABNORMAL HIGH (ref 0–44)
AST: 32 IU/L (ref 0–40)
Albumin/Globulin Ratio: 1.9 (ref 1.2–2.2)
Albumin: 4.2 g/dL (ref 3.8–4.9)
Alkaline Phosphatase: 86 IU/L (ref 39–117)
BUN/Creatinine Ratio: 9 (ref 9–20)
BUN: 7 mg/dL (ref 6–24)
Bilirubin Total: 0.3 mg/dL (ref 0.0–1.2)
CALCIUM: 9.5 mg/dL (ref 8.7–10.2)
CO2: 23 mmol/L (ref 20–29)
CREATININE: 0.74 mg/dL — AB (ref 0.76–1.27)
Chloride: 98 mmol/L (ref 96–106)
GFR calc Af Amer: 120 mL/min/{1.73_m2} (ref >59)
GFR, EST NON AFRICAN AMERICAN: 104 mL/min/{1.73_m2} (ref >59)
GLOBULIN, TOTAL: 2.2 g/dL (ref 1.5–4.5)
Glucose: 282 mg/dL — ABNORMAL HIGH (ref 65–99)
Potassium: 4.3 mmol/L (ref 3.5–5.2)
SODIUM: 138 mmol/L (ref 134–144)
Total Protein: 6.4 g/dL (ref 6.0–8.5)

## 2018-12-02 LAB — HGB A1C W/O EAG: Hgb A1c MFr Bld: 8.2 % — ABNORMAL HIGH (ref 4.8–5.6)

## 2018-12-02 LAB — TSH: TSH: 1.79 u[IU]/mL (ref 0.450–4.500)

## 2018-12-02 LAB — T4, FREE: FREE T4: 1.25 ng/dL (ref 0.82–1.77)

## 2018-12-12 ENCOUNTER — Other Ambulatory Visit: Payer: Self-pay | Admitting: Pulmonary Disease

## 2018-12-12 ENCOUNTER — Other Ambulatory Visit (HOSPITAL_COMMUNITY): Payer: Self-pay | Admitting: Pulmonary Disease

## 2018-12-12 DIAGNOSIS — R911 Solitary pulmonary nodule: Secondary | ICD-10-CM

## 2018-12-12 DIAGNOSIS — J984 Other disorders of lung: Secondary | ICD-10-CM

## 2018-12-22 ENCOUNTER — Ambulatory Visit (HOSPITAL_COMMUNITY): Payer: Medicare HMO

## 2018-12-26 ENCOUNTER — Ambulatory Visit (INDEPENDENT_AMBULATORY_CARE_PROVIDER_SITE_OTHER): Payer: Medicare HMO | Admitting: "Endocrinology

## 2018-12-26 ENCOUNTER — Encounter: Payer: Self-pay | Admitting: "Endocrinology

## 2018-12-26 VITALS — BP 135/86 | HR 77 | Ht 70.0 in | Wt 303.0 lb

## 2018-12-26 DIAGNOSIS — E782 Mixed hyperlipidemia: Secondary | ICD-10-CM

## 2018-12-26 DIAGNOSIS — I10 Essential (primary) hypertension: Secondary | ICD-10-CM

## 2018-12-26 DIAGNOSIS — E1159 Type 2 diabetes mellitus with other circulatory complications: Secondary | ICD-10-CM

## 2018-12-26 MED ORDER — GLIPIZIDE ER 5 MG PO TB24: 5.0000 mg | ORAL_TABLET | Freq: Every day | ORAL | 3 refills | Status: DC

## 2018-12-26 NOTE — Patient Instructions (Signed)

## 2018-12-26 NOTE — Progress Notes (Signed)
Endocrinology follow-up note   Subjective:    Patient ID: [name], male    DOB: [birth year].  he is being seen in follow-up for management of currently uncontrolled symptomatic type 2 diabetes, hypertension, hyperlipidemia, obesity. PMD:  Sharilyn Sites, MD.   Past Medical History:  Diagnosis Date  . Abnormal myocardial perfusion study 01/01/2011   there a small to moderate sized inferobasal scar  . Barrett's esophagus   . Cataracts, bilateral   . Chronic kidney disease    hx of kidney stones  . Claudication ([place]) 11/16/2011   PV test perform shows normal  . COPD (chronic obstructive pulmonary disease) ([place])   . Depression   . Diabetes ([place])    type 2 diabetes mellitus  . Dysrhythmia   . GERD (gastroesophageal reflux disease)   . Glaucoma   . Hernia of abdominal wall   . HTN (hypertension)   . Hyperlipidemia   . Morbid obesity ([place])   . Myocardial infarction ([place]) 2008,2009,2009  . OSA (obstructive sleep apnea)    on cpap  . PAF (paroxysmal atrial fibrillation) ([place])   . S/P colonoscopy 2009   3-4 mm transverse colon erosions likely secondary to  ASA  . S/P endoscopy Dec 2011   moderate erosive gastritis, Barrett's esophagus 1-2cm  . Sleep apnea   . SOB (shortness of breath) 11/03/2007   2D Echo EF 50%-55%  . Stroke [facility])    Past Surgical History:  Procedure Laterality Date  . BIOPSY N/A 04/29/2015   Procedure: BIOPSY;  Surgeon: Danie Binder, MD;  Location: [facility];  Service: Endoscopy;  Laterality: N/A;  . BIOPSY  08/01/2018   Procedure: BIOPSY;  Surgeon: Danie Binder, MD;  [facility]: [facility];  Service: Endoscopy;;  esophageal  . CABG X 4  03/2008  . CARDIAC CATHETERIZATION  2009   stent placement to the left circumflex a 2.25   . CARDIAC CATHETERIZATION  07/08/2010  . COLONOSCOPY WITH PROPOFOL N/A 09/27/2017   Procedure: COLONOSCOPY WITH PROPOFOL;  Surgeon: Danie Binder, MD;  [facility];  Service: Endoscopy;  Laterality: N/A;   12:00pm  . CORONARY ARTERY BYPASS GRAFT  2008   4 vessels  . CORONARY STENT PLACEMENT  12/29/12  . CORONARY STENT PLACEMENT  12/2012  . ESOPHAGEAL DILATION N/A 04/29/2015   Procedure: ESOPHAGEAL DILATION 15 mm, 16 mm;  Surgeon: Danie Binder, MD;  Location: [facility];  Service: Endoscopy;  Laterality: N/A;  . ESOPHAGOGASTRODUODENOSCOPY  09/2011   Barrett's esophagus, no dysplasia on biopsies. Distal esophagitis. Status post dilation. Moderate gastritis and duodenitis, but biopsies benign. Next EGD in November 2015 for surveillance of Barrett's esophagus.  . ESOPHAGOGASTRODUODENOSCOPY (EGD) WITH PROPOFOL N/A 04/29/2015   SLF: 1. Barretts esophagus 2. Moderate non-erosive gastritis.   Marland Kitchen ESOPHAGOGASTRODUODENOSCOPY (EGD) WITH PROPOFOL N/A 08/01/2018   Procedure: ESOPHAGOGASTRODUODENOSCOPY (EGD) WITH PROPOFOL;  Surgeon: Danie Binder, MD;  [facility];  Service: Endoscopy;  Laterality: N/A;  2:30pm  . HERNIA REPAIR     ventral hernia repair  . LEFT HEART CATHETERIZATION WITH CORONARY/GRAFT ANGIOGRAM N/A 12/29/2012   Procedure: LEFT HEART CATHETERIZATION WITH Beatrix Fetters;  Surgeon: Troy Sine, MD;  Location: [facility] LAB;  Service: Cardiovascular;  Laterality: N/A;  . ORIF FIBULA FRACTURE Right 06/03/2015   Procedure: OPEN REDUCTION INTERNAL FIXATION (ORIF) DISTAL FIBULA  FRACTURE;  Surgeon: Garald Balding, MD;  Location: [place];  Service: Orthopedics;  Laterality: Right;  . PERCUTANEOUS CORONARY STENT INTERVENTION (PCI-S)  12/29/2012   Procedure: PERCUTANEOUS CORONARY STENT INTERVENTION (PCI-S);  Surgeon: Troy Sine, MD;  Location: [facility] CATH LAB;  Service: Cardiovascular;;  . POLYPECTOMY  09/27/2017   Procedure: POLYPECTOMY;  Surgeon: Danie Binder, MD;  [facility];  Service: Endoscopy;;  cecal polyp, ascending polyps x6, transverse colon polyps x6, splenic flexure polyps x2, descending colon polyps x6, sigmoid  colon polyp x1, rectal polyp x1   . SAVORY  DILATION  09/06/2011   Procedure: SAVORY DILATION;  Surgeon: Dorothyann Peng, MD;  Location: [facility];  Service: Endoscopy;  Laterality: N/A;  Dilated with 35m  . SAVORY DILATION N/A 08/01/2018   Procedure: SAVORY DILATION;  Surgeon: FDanie Binder MD;  [facility];  Service: Endoscopy;  Laterality: N/A;   Social History   Socioeconomic History  . Marital status: Divorced    Spouse name: Not on file  . Number of children: Not on file  . Years of education: Not on file  . Highest education level: Not on file  Occupational History  . Not on file  Social Needs  . Financial resource strain: Not on file  . Food insecurity:    Worry: Not on file    Inability: Not on file  . Transportation needs:    Medical: Not on file    Non-medical: Not on file  Tobacco Use  . Smoking status: Current Every Day Smoker    Packs/day: 0.50    Years: 30.00    Pack years: 15.00    Types: Cigarettes  . Smokeless tobacco: Never Used  Substance and Sexual Activity  . Alcohol use: Yes    Comment: occ  . Drug use: No  . Sexual activity: Never  Lifestyle  . Physical activity:    Days per week: Not on file    Minutes per session: Not on file  . Stress: Not on file  Relationships  . Social connections:    Talks on phone: Not on file    Gets together: Not on file    Attends religious service: Not on file    Active member of club or organization: Not on file    Attends meetings of clubs or organizations: Not on file    Relationship status: Not on file  Other Topics Concern  . Not on file  Social History Narrative  . Not on file   Outpatient Encounter Medications as of 12/26/2018  Medication Sig  . albuterol (PROVENTIL HFA;VENTOLIN HFA) 108 (90 Base) MCG/ACT inhaler Inhale 2 puffs into the lungs every 4 (four) hours as needed for wheezing or shortness of breath.  .Marland KitchenamLODipine (NORVASC) 5 MG tablet TAKE 1 TABLET BY MOUTH EVERY DAY (Patient taking differently: Take 5 mg by mouth daily. )  .  apixaban (ELIQUIS) 5 MG TABS tablet Take 1 tablet (5 mg total) by mouth 2 (two) times daily.  . Blood Glucose Monitoring Suppl (ACCU-CHEK GUIDE) w/Device KIT 1 each by Does not apply route 4 (four) times daily.  . diazepam (VALIUM) 5 MG tablet Take 5 mg by mouth 2 (two) times daily.  .Marland Kitchenescitalopram (LEXAPRO) 20 MG tablet Take 20 mg daily by mouth.   . fenofibrate micronized (LOFIBRA) 134 MG capsule Take 134 mg by mouth at bedtime.  . Fluticasone-Umeclidin-Vilant (TRELEGY ELLIPTA) 100-62.5-25 MCG/INH AEPB Inhale 1 puff daily into the lungs.   . furosemide (LASIX) 80 MG tablet Take 80 mg by mouth daily.  .Marland KitchenglipiZIDE (GLUCOTROL XL) 5 MG 24 hr tablet Take 1  tablet (5 mg total) by mouth daily with breakfast.  . glucose blood (ACCU-CHEK GUIDE) test strip Use as instructed 4 x daily. E11.65  . Insulin Isophane & Regular Human (HUMULIN 70/30 MIX) (70-30) 100 UNIT/ML PEN Inject 60 Units into the skin 2 (two) times daily before a meal.  . Insulin Isophane & Regular Human (HUMULIN 70/30 MIX) (70-30) 100 UNIT/ML PEN Inject 60 Units into the skin 2 (two) times daily. Novolin 70/30 60 units BID  . Insulin Pen Needle (PEN NEEDLES) 32G X 6 MM MISC 1 each by Does not apply route 4 (four) times daily.  . isosorbide mononitrate (IMDUR) 60 MG 24 hr tablet Take 1 tablet (60 mg total) by mouth 3 (three) times daily. Please make an appointment for additional refills (Patient taking differently: Take 60 mg by mouth 2 (two) times daily. Please make an appointment for additional refills)  . LANCETS ULTRA FINE MISC 1 each by Does not apply route 4 (four) times daily.  Marland Kitchen lisinopril (PRINIVIL,ZESTRIL) 5 MG tablet Take 5 mg by mouth daily.  . meclizine (ANTIVERT) 25 MG tablet Take 25 mg by mouth 2 (two) times daily as needed for dizziness.  . metoprolol tartrate (LOPRESSOR) 100 MG tablet TAKE 1 TABLET BY MOUTH TWICE A DAY (Patient taking differently: Take 100 mg by mouth 2 (two) times daily. )  . modafinil (PROVIGIL) 100 MG  tablet Take 100 mg by mouth daily.   . nitroGLYCERIN (NITROLINGUAL) 0.4 MG/SPRAY spray Place 1 spray under the tongue every 5 (five) minutes x 3 doses as needed for chest pain.  Glory Rosebush DELICA LANCETS FINE MISC 1 each by Does not apply route 4 (four) times daily. e11.65  . pantoprazole (PROTONIX) 40 MG tablet TAKE 1 TABLET BY MOUTH 30 MINUTES BEFORE BREAKFAST (Patient taking differently: Take 40 mg by mouth daily before breakfast. )  . potassium chloride SA (K-DUR,KLOR-CON) 20 MEQ tablet Take 1 tablet (20 mEq total) by mouth 2 (two) times daily.  . ranitidine (ZANTAC) 150 MG capsule Take 150 mg at bedtime by mouth.   . sitaGLIPtin (JANUVIA) 50 MG tablet Take 1 tablet (50 mg total) by mouth daily.  . traMADol (ULTRAM) 50 MG tablet Take 1 tablet (50 mg total) by mouth every 6 (six) hours as needed.  . umeclidinium-vilanterol (ANORO ELLIPTA) 62.5-25 MCG/INH AEPB Inhale 1 puff into the lungs every evening.   . Vitamin D, Ergocalciferol, (DRISDOL) 50000 units CAPS capsule TAKE 1 CAPSULE BY MOUTH ONE TIME PER WEEK (Patient taking differently: Take 50,000 Units by mouth every Monday. )   No facility-administered encounter medications on file as of 12/26/2018.     ALLERGIES: Allergies  Allergen Reactions  . Contrast Media [Iodinated Diagnostic Agents] Other (See Comments)    Pt must be premedicated before given contrast media - stops heart  . Iohexol Other (See Comments)     Consult with radiologist before pre meds are given.Desc: PT. STATES HEART STOPPED HAS TO BE PREMED.     VACCINATION STATUS:  There is no immunization history on file for this patient.  Diabetes  He presents for his follow-up diabetic visit. He has type 2 diabetes mellitus. Onset time: He was diagnosed at approximate age of 67 years. His disease course has been worsening. There are no hypoglycemic associated symptoms. Pertinent negatives for hypoglycemia include no confusion, headaches, pallor or seizures. Associated  symptoms include polydipsia and polyuria. Pertinent negatives for diabetes include no blurred vision, no chest pain, no fatigue, no polyphagia and no weakness. There  are no hypoglycemic complications. Symptoms are improving. Diabetic complications include a CVA and heart disease. Risk factors for coronary artery disease include diabetes mellitus, dyslipidemia, hypertension, male sex, obesity, tobacco exposure, sedentary lifestyle and family history. Current diabetic treatments: He was supposed to be on basal insulin, unfortunately he ran out and could not afford refills and he did not call clinic for alternatives. He is compliant with treatment none of the time. His weight is decreasing steadily. He is following a generally unhealthy diet. When asked about meal planning, he reported none. He has not had a previous visit with a dietitian. He never participates in exercise. His home blood glucose trend is increasing steadily. His breakfast blood glucose range is generally 180-200 mg/dl. His dinner blood glucose range is generally >200 mg/dl. His overall blood glucose range is 180-200 mg/dl. (She comes in with no meter nor logs to review.  His A1c is 10.4% increasing from  7.6%.) An ACE inhibitor/angiotensin II receptor blocker is being taken. He does not see a podiatrist.Eye exam is not current.  Hyperlipidemia  This is a chronic problem. The current episode started more than 1 year ago. The problem is uncontrolled. Recent lipid tests were reviewed and are high. Exacerbating diseases include diabetes and obesity. Pertinent negatives include no chest pain, myalgias or shortness of breath. Current antihyperlipidemic treatment includes statins. Compliance problems include medication cost and psychosocial issues.  Risk factors for coronary artery disease include dyslipidemia, diabetes mellitus, hypertension, male sex, obesity, family history and a sedentary lifestyle.  Hypertension  This is a chronic problem. The  current episode started more than 1 year ago. The problem is uncontrolled. Pertinent negatives include no blurred vision, chest pain, headaches, neck pain, palpitations or shortness of breath. Risk factors for coronary artery disease include diabetes mellitus, dyslipidemia, male gender, obesity, family history, sedentary lifestyle and smoking/tobacco exposure. Past treatments include ACE inhibitors. Hypertensive end-organ damage includes CVA.    Review of Systems  Constitutional: Negative for chills, fatigue, fever and unexpected weight change.  HENT: Negative for dental problem, mouth sores and trouble swallowing.   Eyes: Negative for blurred vision and visual disturbance.  Respiratory: Negative for cough, choking, chest tightness, shortness of breath and wheezing.   Cardiovascular: Negative for chest pain, palpitations and leg swelling.  Gastrointestinal: Negative for abdominal distention, abdominal pain, constipation, diarrhea, nausea and vomiting.  Endocrine: Positive for polydipsia and polyuria. Negative for polyphagia.  Genitourinary: Negative for dysuria, flank pain, hematuria and urgency.  Musculoskeletal: Negative for back pain, gait problem, myalgias and neck pain.  Skin: Negative for pallor, rash and wound.  Neurological: Negative for seizures, syncope, weakness, numbness and headaches.  Psychiatric/Behavioral: Negative.  Negative for confusion and dysphoric mood.    Objective:    BP 135/86   Pulse 77   Ht '5\' 10"'  (1.778 m)   Wt (!) 303 lb (137.4 kg)   BMI 43.48 kg/m   Wt Readings from Last 3 Encounters:  12/26/18 (!) 303 lb (137.4 kg)  08/21/18 286 lb (129.7 kg)  07/26/18 300 lb (136.1 kg)     Physical Exam  Constitutional: He is oriented to person, place, and time. He appears well-developed. He is cooperative. No distress.  Poor personal hygiene.  HENT:  Head: Normocephalic and atraumatic.  Eyes: EOM are normal.  Neck: Normal range of motion. Neck supple. No tracheal  deviation present. No thyromegaly present.  Cardiovascular: Normal rate, S1 normal and S2 normal. Exam reveals no gallop.  No murmur heard. Pulses:  Dorsalis pedis pulses are 1+ on the right side and 1+ on the left side.       Posterior tibial pulses are 1+ on the right side and 1+ on the left side.  Pulmonary/Chest: Effort normal. No respiratory distress. He has no wheezes.  Abdominal: Bowel sounds are normal. He exhibits no distension. There is no abdominal tenderness. There is no guarding and no CVA tenderness.  Musculoskeletal:        General: No edema.     Right shoulder: He exhibits no swelling and no deformity.  Neurological: He is alert and oriented to person, place, and time. He has normal strength and normal reflexes. No cranial nerve deficit or sensory deficit. Gait normal.  Skin: Skin is warm and dry. No rash noted. No cyanosis. Nails show no clubbing.  Psychiatric: His speech is normal. Cognition and memory are normal.  Reluctant affect. Unconcerned attitude.   CMP ( most recent) CMP     Component Value Date/Time   NA 138 12/01/2018 1412   K 4.3 12/01/2018 1412   CL 98 12/01/2018 1412   CO2 23 12/01/2018 1412   GLUCOSE 282 (H) 12/01/2018 1412   GLUCOSE 314 (H) 07/25/2018 1000   BUN 7 12/01/2018 1412   CREATININE 0.74 (L) 12/01/2018 1412   CALCIUM 9.5 12/01/2018 1412   PROT 6.4 12/01/2018 1412   ALBUMIN 4.2 12/01/2018 1412   AST 32 12/01/2018 1412   ALT 45 (H) 12/01/2018 1412   ALKPHOS 86 12/01/2018 1412   BILITOT 0.3 12/01/2018 1412   GFRNONAA 104 12/01/2018 1412   GFRAA 120 12/01/2018 1412   Diabetic Labs (most recent): Lab Results  Component Value Date   HGBA1C 8.2 (H) 12/01/2018   HGBA1C 8.9 (H) 07/31/2018   HGBA1C 10.4 (H) 05/15/2018     Lipid Panel ( most recent) Lipid Panel     Component Value Date/Time   CHOL 137 11/18/2017 0823   TRIG 469 (H) 11/18/2017 0823   HDL 28 (L) 11/18/2017 0823   CHOLHDL 6.1 04/28/2014 0121   VLDL 61 (H)  04/28/2014 0121   LDLCALC Comment 11/18/2017 0823     Assessment & Plan:   1. DM type 2 causing vascular disease ([place])  - Patient has currently uncontrolled symptomatic type 2 DM since  58 years of age. -Patient returns with better engagement in monitoring, and his A1c at 8.2% improving from 10.4%.   -his diabetes is complicated by coronary artery disease, CVA, obesity/sedentary life, chronic heavy smoking and ALI MOHL remains at extremely high risk  for more acute and chronic complications which include CAD, CVA, CKD, retinopathy, and neuropathy. These are all discussed in detail with the patient.  - I have counseled him on diet management and weight loss, by adopting a carbohydrate restricted/protein rich diet.  -  Suggestion is made for him to avoid simple carbohydrates  from his diet including Cakes, Sweet Desserts / Pastries, Ice Cream, Soda (diet and regular), Sweet Tea, Candies, Chips, Cookies, Store Bought Juices, Alcohol in Excess of  1-2 drinks a day, Artificial Sweeteners, and "Sugar-free" Products. This will help patient to have stable blood glucose profile and potentially avoid unintended weight gain.   - I encouraged him to switch to  unprocessed or minimally processed complex starch and increased protein intake (animal or plant source), fruits, and vegetables.  - he is advised to stick to a routine mealtimes to eat 3 meals  a day and avoid unnecessary snacks ( to snack only to correct  hypoglycemia).   - I have approached him with the following individualized plan to manage diabetes and patient agrees:   - He has significant cognitive deficit, for simplicity reasons he will be kept on premixed insulin.  - Besides, he could not afford the co-pays for insulin analogs.    -He is advised to continue   Novolin 70/30 60 units with breakfast and 60 units with supper  for pre-meal glucose readings above 90 mg/dL.   -He is advised to continue Januvia 50 mg p.o. daily with  breakfast.  I discussed and added Glucotrol 5 mg XL daily at breakfast . -He does not tolerate metformin due to GI side effects.   -Patient is encouraged to call clinic for blood glucose levels less than 70 or above 300 mg /dl.  - Patient specific target  A1c;  LDL, HDL, Triglycerides, and  Waist Circumference were discussed in detail.  2) BP/HTN: His blood pressure is controlled to target.  He continues to smoke heavily, he is counseled extensively against smoking.  He is advised to continue his current blood pressure medications consistently including lisinopril 5 mg p.o. daily.   3) Lipids/HPL: His  lipid panel is not controlled  with severe hypertriglyceridemia   of  469.  He is advised to continue Trilipix 135 mg p.o. nightly.    4)  Weight/Diet: CDE Consult has been  initiated , exercise, and detailed carbohydrates information provided.  5) Chronic Care/Health Maintenance:  -he  is on ACEI/ARB and Statin medications and  is encouraged to continue to follow up with Ophthalmology, Dentist,  Podiatrist at least yearly or according to recommendations, and advised to  quit smoking. I have recommended yearly flu vaccine and pneumonia vaccination at least every 5 years; moderate intensity exercise for up to 150 minutes weekly; and  sleep for at least 7 hours a day. -He is consulted for smoking cessation.  - I advised patient to maintain close follow up with Sharilyn Sites, MD for primary care needs.  - Time spent with the patient: 25 min, of which >50% was spent in reviewing his blood glucose logs , discussing his hypoglycemia and hyperglycemia episodes, reviewing his current and  previous labs / studies and medications  doses and developing a plan to avoid hypoglycemia and hyperglycemia. Please refer to Patient Instructions for Blood Glucose Monitoring and Insulin/Medications Dosing Guide"  in media tab for additional information. [name] participated in the discussions, expressed  understanding, and voiced agreement with the above plans.  All questions were answered to his satisfaction. he is encouraged to contact clinic should he have any questions or concerns prior to his return visit.  Follow up plan: - Return in about 3 months (around 03/26/2019) for Follow up with Pre-visit Labs, Meter, and Logs.   Glade Lloyd, MD Phone: [phone]  Fax: [phone]   12/26/2018, 1:48 PM   This note was partially dictated with voice recognition software. Similar sounding words can be transcribed inadequately or may not  be corrected upon review.

## 2018-12-27 ENCOUNTER — Ambulatory Visit (HOSPITAL_COMMUNITY)
Admission: RE | Admit: 2018-12-27 | Discharge: 2018-12-27 | Disposition: A | Discharge status: Home / Self Care | Payer: Medicare HMO | Source: Ambulatory Visit | Attending: Pulmonary Disease | Admitting: Pulmonary Disease

## 2018-12-27 DIAGNOSIS — R911 Solitary pulmonary nodule: Secondary | ICD-10-CM | POA: Insufficient documentation

## 2018-12-27 DIAGNOSIS — J984 Other disorders of lung: Secondary | ICD-10-CM | POA: Diagnosis not present

## 2018-12-27 DIAGNOSIS — R918 Other nonspecific abnormal finding of lung field: Secondary | ICD-10-CM | POA: Diagnosis not present

## 2019-01-11 ENCOUNTER — Ambulatory Visit (INDEPENDENT_AMBULATORY_CARE_PROVIDER_SITE_OTHER): Payer: Medicare HMO | Admitting: Otolaryngology

## 2019-01-11 DIAGNOSIS — R070 Pain in throat: Secondary | ICD-10-CM | POA: Diagnosis not present

## 2019-01-11 DIAGNOSIS — K219 Gastro-esophageal reflux disease without esophagitis: Secondary | ICD-10-CM | POA: Diagnosis not present

## 2019-01-30 ENCOUNTER — Emergency Department (HOSPITAL_COMMUNITY)
Admission: EM | Admit: 2019-01-30 | Discharge: 2019-01-30 | Discharge status: Against Medical Advice | Payer: Medicare HMO | Attending: Emergency Medicine | Admitting: Emergency Medicine

## 2019-01-30 ENCOUNTER — Other Ambulatory Visit: Payer: Self-pay

## 2019-01-30 ENCOUNTER — Emergency Department (HOSPITAL_COMMUNITY): Payer: Medicare HMO

## 2019-01-30 ENCOUNTER — Encounter (HOSPITAL_COMMUNITY): Payer: Self-pay | Admitting: *Deleted

## 2019-01-30 DIAGNOSIS — Z5320 Procedure and treatment not carried out because of patient's decision for unspecified reasons: Secondary | ICD-10-CM | POA: Diagnosis not present

## 2019-01-30 DIAGNOSIS — N189 Chronic kidney disease, unspecified: Secondary | ICD-10-CM | POA: Insufficient documentation

## 2019-01-30 DIAGNOSIS — I5032 Chronic diastolic (congestive) heart failure: Secondary | ICD-10-CM | POA: Diagnosis not present

## 2019-01-30 DIAGNOSIS — I259 Chronic ischemic heart disease, unspecified: Secondary | ICD-10-CM | POA: Insufficient documentation

## 2019-01-30 DIAGNOSIS — E119 Type 2 diabetes mellitus without complications: Secondary | ICD-10-CM | POA: Insufficient documentation

## 2019-01-30 DIAGNOSIS — R079 Chest pain, unspecified: Secondary | ICD-10-CM | POA: Diagnosis not present

## 2019-01-30 DIAGNOSIS — F17210 Nicotine dependence, cigarettes, uncomplicated: Secondary | ICD-10-CM | POA: Diagnosis not present

## 2019-01-30 DIAGNOSIS — I209 Angina pectoris, unspecified: Secondary | ICD-10-CM | POA: Diagnosis not present

## 2019-01-30 DIAGNOSIS — J449 Chronic obstructive pulmonary disease, unspecified: Secondary | ICD-10-CM | POA: Diagnosis not present

## 2019-01-30 DIAGNOSIS — I130 Hypertensive heart and chronic kidney disease with heart failure and stage 1 through stage 4 chronic kidney disease, or unspecified chronic kidney disease: Secondary | ICD-10-CM | POA: Insufficient documentation

## 2019-01-30 DIAGNOSIS — I2510 Atherosclerotic heart disease of native coronary artery without angina pectoris: Secondary | ICD-10-CM | POA: Diagnosis not present

## 2019-01-30 DIAGNOSIS — Z681 Body mass index (BMI) 19 or less, adult: Secondary | ICD-10-CM | POA: Diagnosis not present

## 2019-01-30 DIAGNOSIS — R69 Illness, unspecified: Secondary | ICD-10-CM | POA: Diagnosis not present

## 2019-01-30 DIAGNOSIS — E1122 Type 2 diabetes mellitus with diabetic chronic kidney disease: Secondary | ICD-10-CM | POA: Diagnosis not present

## 2019-01-30 DIAGNOSIS — R072 Precordial pain: Secondary | ICD-10-CM | POA: Diagnosis not present

## 2019-01-30 LAB — BASIC METABOLIC PANEL
Anion gap: 10 (ref 5–15)
BUN: 11 mg/dL (ref 6–20)
CALCIUM: 9 mg/dL (ref 8.9–10.3)
CO2: 26 mmol/L (ref 22–32)
Chloride: 101 mmol/L (ref 98–111)
Creatinine, Ser: 0.85 mg/dL (ref 0.61–1.24)
GFR calc non Af Amer: >60 mL/min (ref >60)
Glucose, Bld: 209 mg/dL — ABNORMAL HIGH (ref 70–99)
Potassium: 3.6 mmol/L (ref 3.5–5.1)
Sodium: 137 mmol/L (ref 135–145)

## 2019-01-30 LAB — CBC
HEMATOCRIT: 45.9 % (ref 39.0–52.0)
Hemoglobin: 15.9 g/dL (ref 13.0–17.0)
MCH: 32.1 pg (ref 26.0–34.0)
MCHC: 34.6 g/dL (ref 30.0–36.0)
MCV: 92.7 fL (ref 80.0–100.0)
PLATELETS: 235 10*3/uL (ref 150–400)
RBC: 4.95 MIL/uL (ref 4.22–5.81)
RDW: 12.2 % (ref 11.5–15.5)
WBC: 11.8 10*3/uL — ABNORMAL HIGH (ref 4.0–10.5)
nRBC: 0 % (ref 0.0–0.2)

## 2019-01-30 LAB — TROPONIN I: Troponin I: <0.03 ng/mL (ref <0.03)

## 2019-01-30 MED ORDER — SODIUM CHLORIDE 0.9% FLUSH: 3.0000 mL | Freq: Once | INTRAVENOUS | Status: DC

## 2019-01-30 MED ORDER — NITROGLYCERIN 0.4 MG/SPRAY TL SOLN: 1.0000 | 0 refills | Status: DC | PRN

## 2019-01-30 NOTE — ED Provider Notes (Signed)
Emergency Department Provider Note   I have reviewed the triage vital signs and the nursing notes.   HISTORY  Chief Complaint Chest Pain   HPI [name] is a 58 y.o. male with PMH of COPD, DM, GERD, HTN, and HLD presents to the emergency department for evaluation of chest discomfort.  Patient referred from his PCPs office.  Patient tells me that he has a  cardiac history and frequently gets chest pain.  When at home, he takes his nitroglycerin and symptoms resolved.  He states that today he was in his PCPs office when the chest pain began.  He states it was not new, different, worse than normal.  He was given aspirin and nitroglycerin there and referred to the emergency department.  He did insist on driving himself.  He is no er experiencing chest pain.  He states it felt like a cramping pain in his left chest and radiated to the left shoulder.  This is typical of his prior pain.  Patient has history of bypass surgery and is followed by cardiology at [facility].     Past Medical History:  Diagnosis Date  . Abnormal myocardial perfusion study 01/01/2011   there a small to moderate sized inferobasal scar  . Barrett's esophagus   . Cataracts, bilateral   . Chronic kidney disease    hx of kidney stones  . Claudication ([place]) 11/16/2011   PV test perform shows normal  . COPD (chronic obstructive pulmonary disease) ([place])   . Depression   . Diabetes ([place])    type 2 diabetes mellitus  . Dysrhythmia   . GERD (gastroesophageal reflux disease)   . Glaucoma   . Hernia of abdominal wall   . HTN (hypertension)   . Hyperlipidemia   . Morbid obesity ([place])   . Myocardial infarction ([place]) 2008,2009,2009  . OSA (obstructive sleep apnea)    on cpap  . PAF (paroxysmal atrial fibrillation) ([place])   . S/P colonoscopy 2009   3-4 mm transverse colon erosions likely secondary to  ASA  . S/P endoscopy Dec 2011   moderate erosive gastritis, Barrett's esophagus 1-2cm  . Sleep apnea    . SOB (shortness of breath) 11/03/2007   2D Echo EF 50%-55%  . Stroke [facility])     Patient Active Problem List   Diagnosis Date Noted  . Personal history of noncompliance with medical treatment, presenting hazards to health 06/05/2018  . Chest pain at rest 11/27/2017  . Chronic kidney disease 11/27/2017  . DM type 2 causing vascular disease ([place]) 11/27/2017  . HTN (hypertension) 11/27/2017  . H/O Stroke 11/27/2017  . Glaucoma 11/27/2017  . Current every day smoker 11/27/2017  . Severe Vitamin D deficiency 11/27/2017  . Diastolic congestive heart failure, NYHA class 2 ([place]) 11/27/2017  . History of colonic polyps   . Rectal bleeding 08/23/2017  . COPD (chronic obstructive pulmonary disease) ([place])   . Fracture of fibula, right, closed 06/03/2015  . Dysphagia 04/07/2015  . Obesity, unspecified 04/28/2014  . CVA (cerebral infarction) 04/27/2014  . Aphasia 04/27/2014  . Pneumothorax on right 03/16/2014  . Lung nodule 03/16/2014  . Chest pain 03/15/2014  . Angina, class III ([place]) 03/14/2013  . Current smoker 03/14/2013  . Depression 03/14/2013  . Unstable angina ([place]) 12/30/2012  . Abnormal nuclear stress test 12/30/2012  . Barrett's esophagus 08/25/2011  . Abnormal myocardial perfusion study 01/01/2011  . ABDOMINAL PAIN, EPIGASTRIC 09/29/2010  . GERD (gastroesophageal reflux disease) 06/01/2010  . HEMATOCHEZIA 06/01/2010  .  HEMATURIA UNSPECIFIED 06/01/2010  . Abdominal pain 06/01/2010  . OSA (obstructive sleep apnea) 06/21/2008  . RESTLESS LEGS SYNDROME 06/21/2008  . MYOCARDIAL INFARCTION 06/21/2008  . ALLERGIC RHINITIS 06/21/2008  . Mixed hyperlipidemia 06/20/2008  . Morbid obesity ([place]) 06/20/2008  . Essential hypertension, benign 06/20/2008  . CAD (coronary artery disease) 06/20/2008  . PAF (paroxysmal atrial fibrillation) ([place]) 06/20/2008  . SOB (shortness of breath) 11/03/2007    Past Surgical History:  Procedure Laterality Date  . BIOPSY N/A 04/29/2015    Procedure: BIOPSY;  Surgeon: Danie Binder, MD;  Location: [facility];  Service: Endoscopy;  Laterality: N/A;  . BIOPSY  08/01/2018   Procedure: BIOPSY;  Surgeon: Danie Binder, MD;  Location: [facility];  Service: Endoscopy;;  esophageal  . CABG X 4  03/2008  . CARDIAC CATHETERIZATION  2009   stent placement to the left circumflex a 2.25   . CARDIAC CATHETERIZATION  07/08/2010  . COLONOSCOPY WITH PROPOFOL N/A 09/27/2017   Procedure: COLONOSCOPY WITH PROPOFOL;  Surgeon: Danie Binder, MD;  Location: [facility];  Service: Endoscopy;  Laterality: N/A;  12:00pm  . CORONARY ARTERY BYPASS GRAFT  2008   4 vessels  . CORONARY STENT PLACEMENT  12/29/12  . CORONARY STENT PLACEMENT  12/2012  . ESOPHAGEAL DILATION N/A 04/29/2015   Procedure: ESOPHAGEAL DILATION 15 mm, 16 mm;  Surgeon: Danie Binder, MD;  Location: [facility];  Service: Endoscopy;  Laterality: N/A;  . ESOPHAGOGASTRODUODENOSCOPY  09/2011   Barrett's esophagus, no dysplasia on biopsies. Distal esophagitis. Status post dilation. Moderate gastritis and duodenitis, but biopsies benign. Next EGD in November 2015 for surveillance of Barrett's esophagus.  . ESOPHAGOGASTRODUODENOSCOPY (EGD) WITH PROPOFOL N/A 04/29/2015   SLF: 1. Barretts esophagus 2. Moderate non-erosive gastritis.   Marland Kitchen ESOPHAGOGASTRODUODENOSCOPY (EGD) WITH PROPOFOL N/A 08/01/2018   Procedure: ESOPHAGOGASTRODUODENOSCOPY (EGD) WITH PROPOFOL;  Surgeon: Danie Binder, MD;  [facility];  Service: Endoscopy;  Laterality: N/A;  2:30pm  . HERNIA REPAIR     ventral hernia repair  . LEFT HEART CATHETERIZATION WITH CORONARY/GRAFT ANGIOGRAM N/A 12/29/2012   Procedure: LEFT HEART CATHETERIZATION WITH Beatrix Fetters;  Surgeon: Troy Sine, MD;  Location: [facility] CATH LAB;  Service: Cardiovascular;  Laterality: N/A;  . ORIF FIBULA FRACTURE Right 06/03/2015   Procedure: OPEN REDUCTION INTERNAL FIXATION (ORIF) DISTAL FIBULA  FRACTURE;  Surgeon: Garald Balding, MD;  Location:  [place];  Service: Orthopedics;  Laterality: Right;  . PERCUTANEOUS CORONARY STENT INTERVENTION (PCI-S)  12/29/2012   Procedure: PERCUTANEOUS CORONARY STENT INTERVENTION (PCI-S);  Surgeon: Troy Sine, MD;  Location: [facility] CATH LAB;  Service: Cardiovascular;;  . POLYPECTOMY  09/27/2017   Procedure: POLYPECTOMY;  Surgeon: Danie Binder, MD;  [facility];  Service: Endoscopy;;  cecal polyp, ascending polyps x6, transverse colon polyps x6, splenic flexure polyps x2, descending colon polyps x6, sigmoid  colon polyp x1, rectal polyp x1   . SAVORY DILATION  09/06/2011   Procedure: SAVORY DILATION;  Surgeon: Dorothyann Peng, MD;  Location: [facility];  Service: Endoscopy;  Laterality: N/A;  Dilated with 62mm  . SAVORY DILATION N/A 08/01/2018   Procedure: SAVORY DILATION;  Surgeon: Danie Binder, MD;  [facility];  Service: Endoscopy;  Laterality: N/A;    Allergies Contrast media [iodinated diagnostic agents] and Iohexol  Family History  Problem Relation Age of Onset  . Diabetes Mother   . Heart disease Mother   . Diabetes Father   . Heart  disease Father   . Pancreatic cancer Brother        age 40, doing well  . Colon cancer Neg Hx   . Anesthesia problems Neg Hx   . Hypotension Neg Hx   . Malignant hyperthermia Neg Hx   . Pseudochol deficiency Neg Hx     Social History Social History   Tobacco Use  . Smoking status: Current Every Day Smoker    Packs/day: 0.50    Years: 30.00    Pack years: 15.00    Types: Cigarettes  . Smokeless tobacco: Never Used  Substance Use Topics  . Alcohol use: Yes    Comment: occ  . Drug use: No    Review of Systems  Constitutional: No fever/chills Eyes: No visual changes. ENT: No sore throat. Cardiovascular: Positive chest pain. Respiratory: Denies shortness of breath. Gastrointestinal: No abdominal pain.  No nausea, no vomiting.  No diarrhea.  No constipation. Genitourinary: Negative for dysuria. Musculoskeletal: Negative  for back pain. Skin: Negative for rash. Neurological: Negative for headaches, focal weakness or numbness.  10-point ROS otherwise negative.  ____________________________________________   PHYSICAL EXAM:  VITAL SIGNS: ED Triage Vitals  Enc Vitals Group     BP 01/30/19 1251 (!) 135/91     Pulse Rate 01/30/19 1251 69     Resp 01/30/19 1259 10     Temp 01/30/19 1301 98.9 F (37.2 C)     Temp Source 01/30/19 1301 Oral     SpO2 01/30/19 1251 95 %     Weight 01/30/19 1257 293 lb (132.9 kg)     Height 01/30/19 1257 5' 10.5" (1.791 m)   Constitutional: Alert and oriented. Well appearing and in no acute distress. Eyes: Conjunctivae are normal. Head: Atraumatic. Nose: No congestion/rhinnorhea. Mouth/Throat: Mucous membranes are moist.  Neck: No stridor.   Cardiovascular: Normal rate, regular rhythm. Good peripheral circulation. Grossly normal heart sounds.   Respiratory: Normal respiratory effort.  No retractions. Lungs CTAB. Gastrointestinal: Soft and nontender. No distention.  Musculoskeletal: No lower extremity tenderness nor edema. No gross deformities of extremities. Neurologic:  Normal speech and language. No gross focal neurologic deficits are appreciated.  Skin:  Skin is warm, dry and intact. No rash noted.  ____________________________________________   LABS (all labs ordered are listed, but only abnormal results are displayed)  Labs Reviewed  BASIC METABOLIC PANEL - Abnormal; Notable for the following components:      Result Value   Glucose, Bld 209 (*)    All other components within normal limits  CBC - Abnormal; Notable for the following components:   WBC 11.8 (*)    All other components within normal limits  TROPONIN I   ____________________________________________  EKG   EKG Interpretation  Date/Time:  Tuesday January 30 2019 12:57:00 EDT Ventricular Rate:  73 PR Interval:    QRS Duration: 91 QT Interval:  403 QTC Calculation: 445 R Axis:   65 Text  Interpretation:  Sinus rhythm Low voltage, extremity leads Abnormal R-wave progression, early transition No STEMI  Confirmed by Nanda Quinton [phone]) on 01/30/2019 1:15:50 PM       ____________________________________________  RADIOLOGY  Dg Chest 2 View  Result Date: 01/30/2019 CLINICAL DATA:  Chest pain. EXAM: CHEST - 2 VIEW COMPARISON:  Radiographs of July 25, 2018. FINDINGS: The heart size and mediastinal contours are within normal limits. Status post coronary bypass graft. No pneumothorax or pleural effusion is noted. No acute pulmonary disease is noted. The visualized skeletal structures are unremarkable. IMPRESSION: No active  cardiopulmonary disease. Electronically Signed   By: Marijo Conception, M.D.   On: 01/30/2019 13:48    ____________________________________________   PROCEDURES  Procedure(s) performed:   Procedures  None  ____________________________________________   INITIAL IMPRESSION / ASSESSMENT AND PLAN / ED COURSE  Pertinent labs & imaging results that were available during my care of the patient were reviewed by me and considered in my medical decision making (see chart for details).   Patient presents to the emergency department for evaluation of chest pain.  Chest pain began while at his PCP for a regular/routine visit.  Patient tells me that if he had not been in his PCPs office he would have typically treated this at home with nitroglycerin and not presented to the emergency department.  He is not having active chest pain.  His EKG read was reviewed and seems similar to his prior tracings.  Patient does have known CAD and several risk factors.  He tells me that he gets this same pain 1-3 times per month at baseline and today was no different. Very low suspicion for PE.   03:05 PM  Patient's lab work including troponin are unremarkable.  Chest x-ray reviewed with no acute findings.  EKG similar to prior.  Patient has not had return of pain.  Given his elevated  risk for ACS I discussed observation admission versus troponin trending.  Patient states that actually he has to leave the emergency department to feed his elderly mother who is depending on him for care.  He states he will not be able to stay for the second troponin.  We discussed that the initial lab work may be falsely negative as it can sometimes take time for the troponin level to rise in the blood.  He understands that by not doing the second test we could be missing a heart attack which could be potentially deadly.  Patient verbalizes understanding.  He states he has these chest pain episodes frequently.  Plans to call his cardiologist either this afternoon or first thing tomorrow.  I have provided a prescription for sublingual nitro spray at his request.  Discussed that he is free to return to the emergency department anytime should his symptoms worsen or if he changes his mind about treatment/additional testing.  ____________________________________________  FINAL CLINICAL IMPRESSION(S) / ED DIAGNOSES  Final diagnoses:  Precordial chest pain     MEDICATIONS GIVEN DURING THIS VISIT:  Medications  sodium chloride flush (NS) 0.9 % injection 3 mL (3 mLs Intravenous Not Given 01/30/19 1353)     NEW OUTPATIENT MEDICATIONS STARTED DURING THIS VISIT:  New Prescriptions   NITROGLYCERIN (NITROLINGUAL) 0.4 MG/SPRAY SPRAY    Place 1 spray under the tongue every 5 (five) minutes x 3 doses as needed for chest pain.    Note:  This document was prepared using Dragon voice recognition software and may include unintentional dictation errors.  Nanda Quinton, MD Emergency Medicine    , [facility], MD 01/30/19 [phone]

## 2019-01-30 NOTE — ED Triage Notes (Signed)
Pt with chest pain off and on for years, seen Dr. Hilma Favors and was sent here.  Pt received ASA and one NTG SL at office.

## 2019-01-30 NOTE — Discharge Instructions (Signed)

## 2019-02-03 ENCOUNTER — Other Ambulatory Visit: Payer: Self-pay | Admitting: "Endocrinology

## 2019-02-06 IMAGING — CT CT CHEST W/O CM
2 of 3 series · 15 of 36 positions shown, 18 images · non-contrast
Comparison: Chest CT 12/17/2016 and earlier.

CLINICAL DATA: 57-year-old male shortness of breath on exertion
with right side and anterior chest pain for 2-3 days. Home oxygen at
night with CPAP. Pulmonary nodules.

EXAM:
CT CHEST WITHOUT CONTRAST
TECHNIQUE: Multidetector CT imaging of the chest was performed following the
standard protocol without IV contrast.

[Series 2: thorax · axial · 0.74mm/px · z∈[+1385,+1615]mm · 12 of 135 slices shown, 15 images]
[im 10/135  mediastinal]
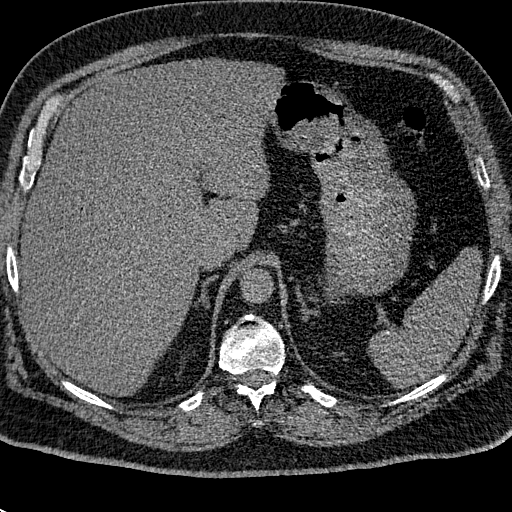
[im 10/135  lung]
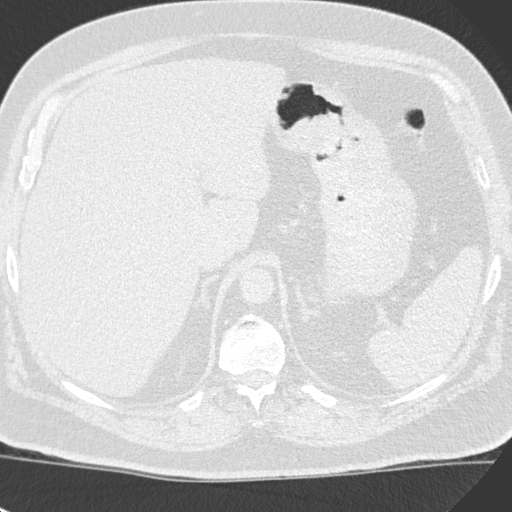
[im 20/135  lung]
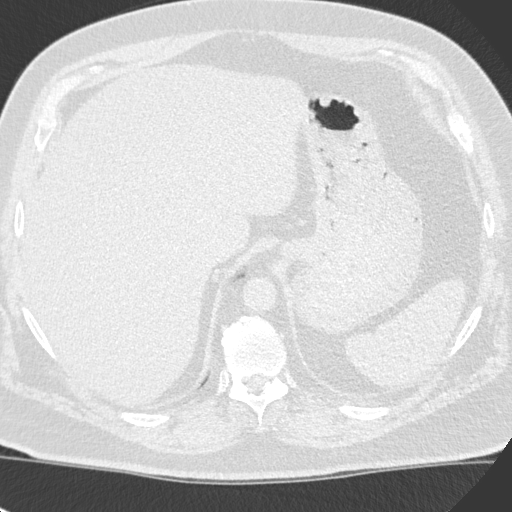
[im 30/135  lung]
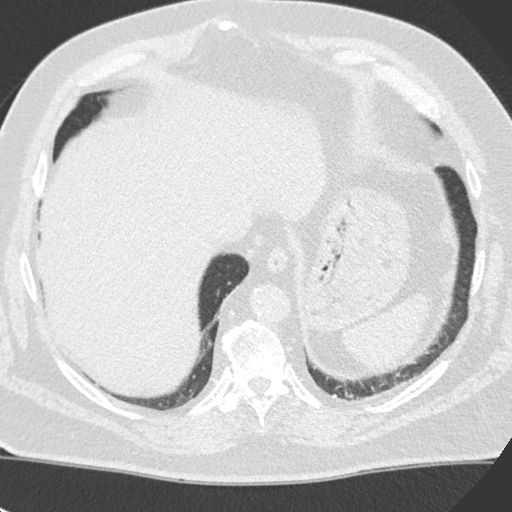
[im 40/135  lung]
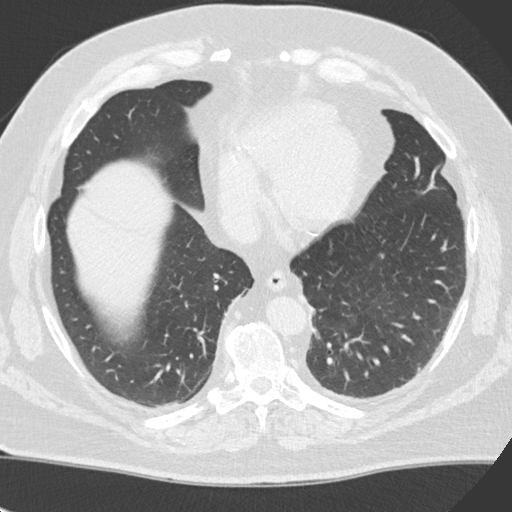
[im 50/135  mediastinal]
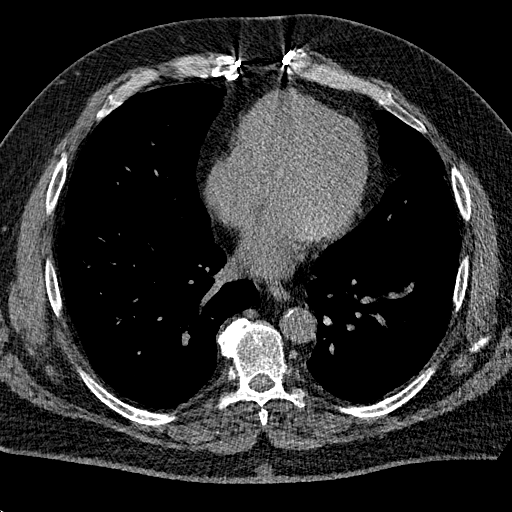
[im 50/135  lung]
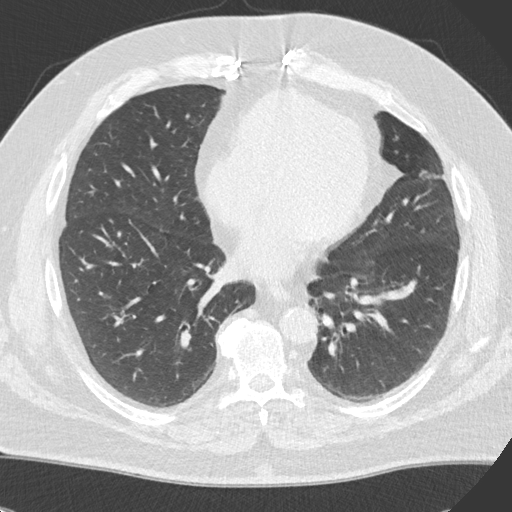
[im 60/135  lung]
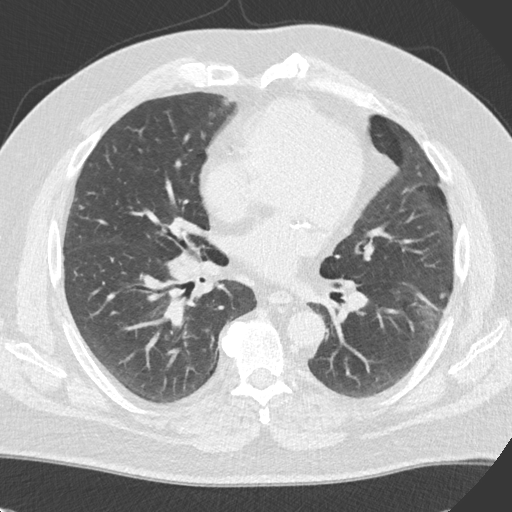
[im 75/135  lung]
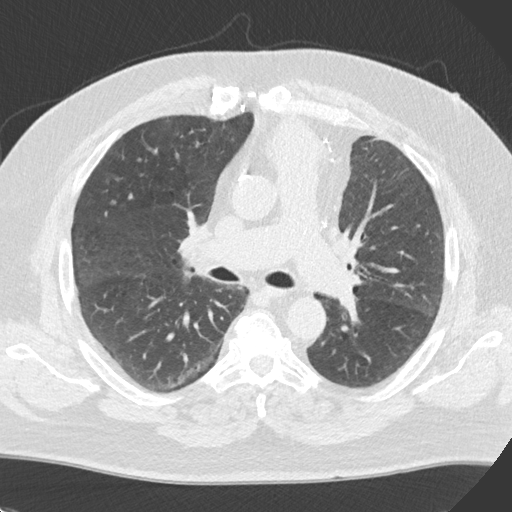
[im 85/135  lung]
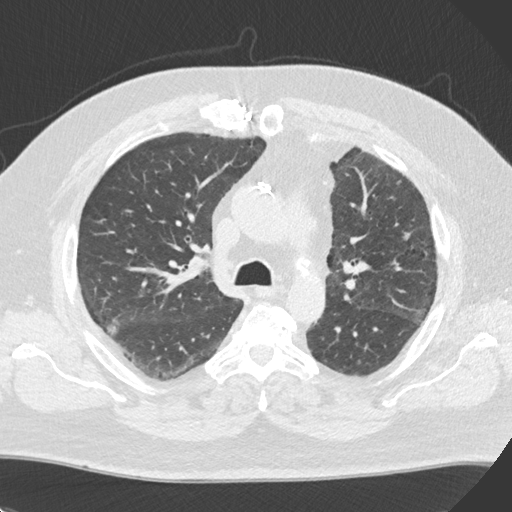
[im 95/135  mediastinal]
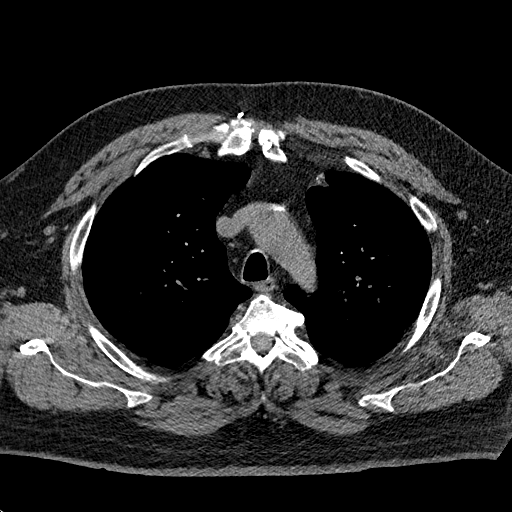
[im 95/135  lung]
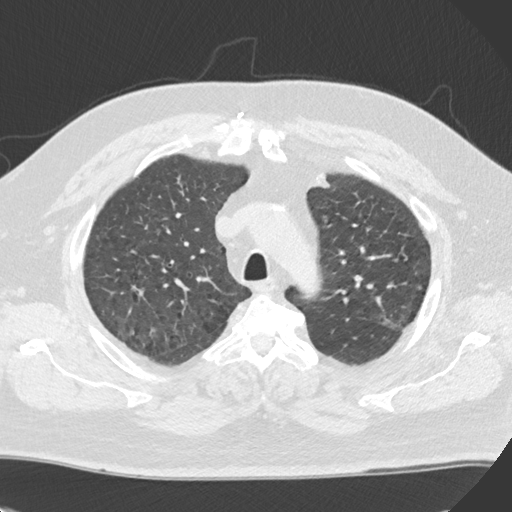
[im 105/135  lung]
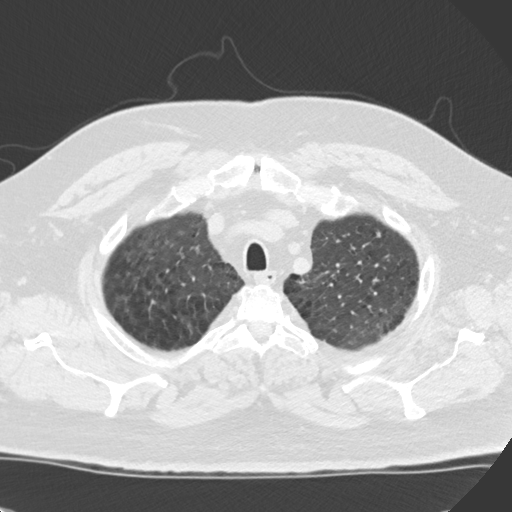
[im 115/135  lung]
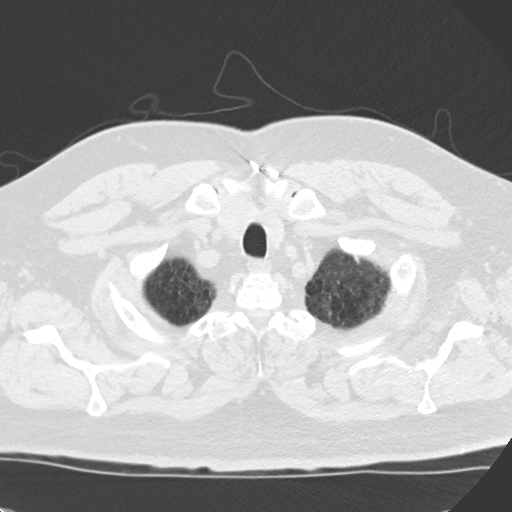
[im 125/135  lung]
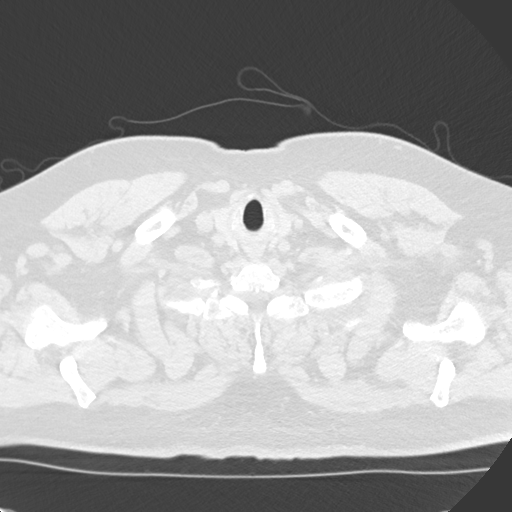

[Series 5: coronal · coronal · 0.54mm/px · 3 of 164 slices shown]
[im 33/164  lung]
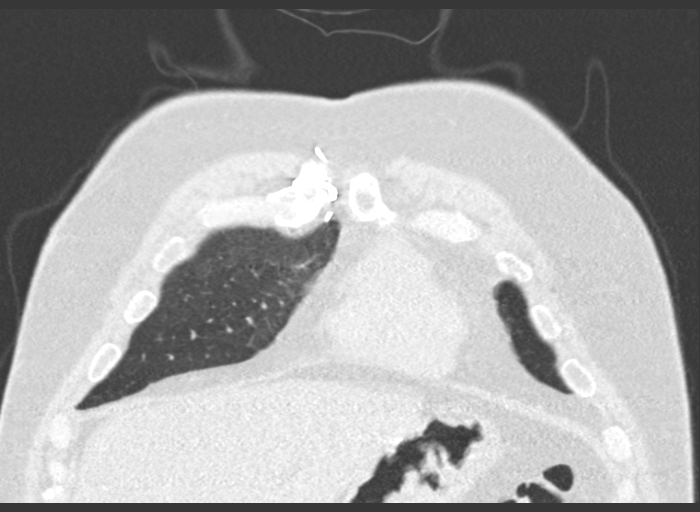
[im 66/164  lung]
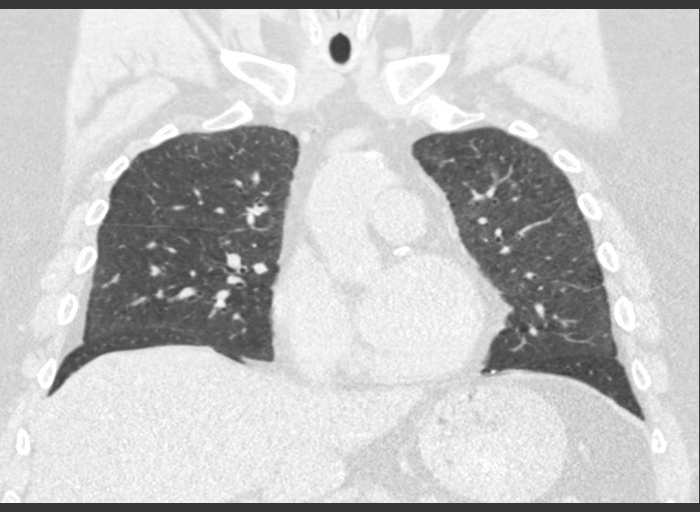
[im 98/164  lung]
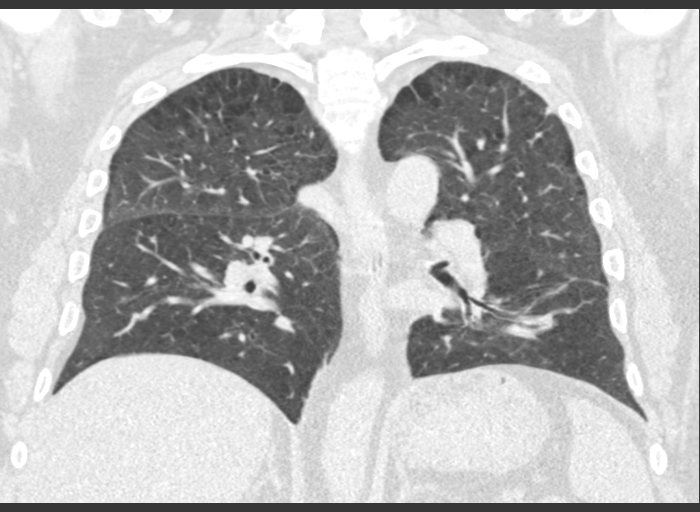

[15 of 36 positions shown; findings below may reference images not displayed]

FINDINGS: Cardiovascular: Prior CABG. Mild Calcified aortic atherosclerosis.
Vascular patency is not evaluated in the absence of IV contrast. No
cardiomegaly or pericardial effusion.

Mediastinum/Nodes: Negative.  No lymphadenopathy.

Lungs/Pleura: Major airways are patent.

Centrilobular emphysema.

Numerous small chronic apical lung nodules appear stable. Calcified
granuloma in the right upper lung periphery redemonstrated on series
4, image 38.

In the right middle lobe there is a new 6-7 millimeter nodule on
series 4, image 64.. There are smaller similar new nodules elsewhere
in the region including those annotated on images 66 and 68.

Stable lateral segment right middle lobe nodules on images 79 and
90.

Stable occasional right lower lobe nodules (such as series 4, image
80 in the lateral basal segment.

Stable left lung nodules.  No consolidation or pleural effusion.

Upper Abdomen: Chronic a patent steatosis suspected but otherwise
negative visible noncontrast liver, spleen, pancreas, adrenal
glands, kidneys, and bowel in the upper abdomen.

Musculoskeletal: Chronic nonunion of the median sternotomy. No acute
osseous abnormality identified.
IMPRESSION: 1. Multiple new small and irregular lung nodules in the right middle
lobe are most likely inflammatory (at least 6 new nodules from
series 4, image 64 through 68).
2. Underlying chronic lung disease with Emphysema ([id number]), and
other bilateral lung nodules are stable and benign.
3. Otherwise stable noncontrast chest.  Prior CABG.

## 2019-02-13 DIAGNOSIS — Z951 Presence of aortocoronary bypass graft: Secondary | ICD-10-CM | POA: Diagnosis not present

## 2019-02-14 ENCOUNTER — Other Ambulatory Visit: Payer: Self-pay

## 2019-02-14 ENCOUNTER — Encounter: Payer: Self-pay | Admitting: Gastroenterology

## 2019-02-14 ENCOUNTER — Ambulatory Visit (INDEPENDENT_AMBULATORY_CARE_PROVIDER_SITE_OTHER): Payer: Medicare HMO | Admitting: Gastroenterology

## 2019-02-14 DIAGNOSIS — K219 Gastro-esophageal reflux disease without esophagitis: Secondary | ICD-10-CM | POA: Diagnosis not present

## 2019-02-14 MED ORDER — PANTOPRAZOLE SODIUM 40 MG PO TBEC: 40.0000 mg | DELAYED_RELEASE_TABLET | Freq: Two times a day (BID) | ORAL | 3 refills | Status: DC

## 2019-02-14 NOTE — Patient Instructions (Signed)
I have increased Protonix to twice a day, 30 minutes before breakfast and dinner. Call me in about 2 weeks with an update.  We will see you in the office in about 3 months!  It was a pleasure to talk to you today. I strive to create trusting relationships with patients to provide genuine, compassionate, and quality care. I value your feedback. If you receive a survey regarding your visit,  I greatly appreciate you taking time to fill this out.   Annitta Needs, PhD, ANP-BC [facility]

## 2019-02-14 NOTE — Progress Notes (Signed)
Referring Provider:  Primary Care Physician:  Sharilyn Sites, MD  Primary GI:   Virtual Visit via Telephone Note Due to COVID-19, visit is conducted virtually and was requested by patient.   I connected with [name] on 02/14/19 at  3:00 PM EDT by telephone and verified that I am speaking with the correct person using two identifiers.   I discussed the limitations, risks, security and privacy concerns of performing an evaluation and management service by telephone and the availability of in person appointments. I also discussed with the patient that there may be a patient responsible charge related to this service. The patient expressed understanding and agreed to proceed.  Chief Complaint  Patient presents with   Gastroesophageal Reflux    "coughs up stuff"   Dysphagia    "gets choked"     History of Present Illness: Pleasant 58 year old male with history of Barrett's, with recent EGD completed. Empiric dilatation also performed at that time. Surveillance colonoscopy due to multiple colon polyps due in 2021.   Getting choked intermittently, not while eating or drinking. Does feel some better s/p dilatation. Recently more choking and phlegm coming up. Protonix once daily. Will try to stand up and put hands over head. Frequent stools in the morning, chronic. GERD exacerbated.      Past Medical History:  Diagnosis Date   Abnormal myocardial perfusion study 01/01/2011   there a small to moderate sized inferobasal scar   Barrett's esophagus    Cataracts, bilateral    Chronic kidney disease    hx of kidney stones   Claudication ([place]) 11/16/2011   PV test perform shows normal   COPD (chronic obstructive pulmonary disease) (HCC)    Depression    Diabetes ([place])    type 2 diabetes mellitus   Dysrhythmia    GERD (gastroesophageal reflux disease)    Glaucoma    Hernia of abdominal wall    HTN (hypertension)    Hyperlipidemia    Morbid obesity  ([place])    Myocardial infarction ([place]) 2008,2009,2009   OSA (obstructive sleep apnea)    on cpap   PAF (paroxysmal atrial fibrillation) ([place])    S/P colonoscopy 2009   3-4 mm transverse colon erosions likely secondary to  ASA   S/P endoscopy Dec 2011   moderate erosive gastritis, Barrett's esophagus 1-2cm   Sleep apnea    SOB (shortness of breath) 11/03/2007   2D Echo EF 50%-55%   Stroke [facility])      Past Surgical History:  Procedure Laterality Date   BIOPSY N/A 04/29/2015   Procedure: BIOPSY;  Surgeon: Danie Binder, MD;  Location: [facility];  Service: Endoscopy;  Laterality: N/A;   BIOPSY  08/01/2018   Procedure: BIOPSY;  Surgeon: Danie Binder, MD;  Location: [facility];  Service: Endoscopy;;  esophageal   CABG X 4  03/2008   CARDIAC CATHETERIZATION  2009   stent placement to the left circumflex a 2.25    CARDIAC CATHETERIZATION  07/08/2010   COLONOSCOPY WITH PROPOFOL N/A 09/27/2017   normal ileum, twenty 4 to 8 mm polyps in the sigmoid colon, descending colon, splenic flexure, transverse colon, ascending colon, cecum.  An additional three 2 to 4 mm polyps in the rectum and the descending colon.  Diverticulosis, internal hemorrhoids.  Surgical pathology found the polyps to be one fragment of hyperplastic polyp and 22 fragments of tubular adenoma..  Recommended repeat colonoscop   CORONARY ARTERY BYPASS GRAFT  2008  4 vessels   CORONARY STENT PLACEMENT  12/29/12   CORONARY STENT PLACEMENT  12/2012   ESOPHAGEAL DILATION N/A 04/29/2015   Procedure: ESOPHAGEAL DILATION 15 mm, 16 mm;  Surgeon: Danie Binder, MD;  Location: [facility];  Service: Endoscopy;  Laterality: N/A;   ESOPHAGOGASTRODUODENOSCOPY  09/2011   Barrett's esophagus, no dysplasia on biopsies. Distal esophagitis. Status post dilation. Moderate gastritis and duodenitis, but biopsies benign. Next EGD in November 2015 for surveillance of Barrett's esophagus.   ESOPHAGOGASTRODUODENOSCOPY (EGD) WITH PROPOFOL N/A  04/29/2015   SLF: 1. Barretts esophagus 2. Moderate non-erosive gastritis.    ESOPHAGOGASTRODUODENOSCOPY (EGD) WITH PROPOFOL N/A 08/01/2018   Barrett's, repeat in 5 years. Empiric dilatation, mild gastritis   HERNIA REPAIR     ventral hernia repair   LEFT HEART CATHETERIZATION WITH CORONARY/GRAFT ANGIOGRAM N/A 12/29/2012   Procedure: LEFT HEART CATHETERIZATION WITH Beatrix Fetters;  Surgeon: Troy Sine, MD;  Location: [facility] CATH LAB;  Service: Cardiovascular;  Laterality: N/A;   ORIF FIBULA FRACTURE Right 06/03/2015   Procedure: OPEN REDUCTION INTERNAL FIXATION (ORIF) DISTAL FIBULA  FRACTURE;  Surgeon: Garald Balding, MD;  Location: [facility];  Service: Orthopedics;  Laterality: Right;   PERCUTANEOUS CORONARY STENT INTERVENTION (PCI-S)  12/29/2012   Procedure: PERCUTANEOUS CORONARY STENT INTERVENTION (PCI-S);  Surgeon: Troy Sine, MD;  [facility] C[facility] LAB;  Service: Cardiovascular;;   POLYPECTOMY  09/27/2017   Procedure: POLYPECTOMY;  Surgeon: Danie Binder, MD;  [facility];  Service: Endoscopy;;  cecal polyp, ascending polyps x6, transverse colon polyps x6, splenic flexure polyps x2, descending colon polyps x6, sigmoid  colon polyp x1, rectal polyp x1    SAVORY DILATION  09/06/2011   Procedure: SAVORY DILATION;  Surgeon: Dorothyann Peng, MD;  Location: [facility];  Service: Endoscopy;  Laterality: N/A;  Dilated with 20m   SAVORY DILATION N/A 08/01/2018   Procedure: SAVORY DILATION;  Surgeon: FDanie Binder MD;  [facility];  Service: Endoscopy;  Laterality: N/A;     Current Meds  Medication Sig   albuterol (PROVENTIL HFA;VENTOLIN HFA) 108 (90 Base) MCG/ACT inhaler Inhale 2 puffs into the lungs every 4 (four) hours as needed for wheezing or shortness of breath.   amLODipine (NORVASC) 5 MG tablet TAKE 1 TABLET BY MOUTH EVERY DAY (Patient taking differently: Take 5 mg by mouth daily. )   apixaban (ELIQUIS) 5 MG TABS tablet Take 1 tablet (5 mg total)  by mouth 2 (two) times daily.   Blood Glucose Monitoring Suppl (ACCU-CHEK GUIDE) w/Device KIT 1 each by Does not apply route 4 (four) times daily.   diazepam (VALIUM) 5 MG tablet Take 5 mg by mouth 2 (two) times daily.   escitalopram (LEXAPRO) 20 MG tablet Take 20 mg daily by mouth.    fenofibrate micronized (LOFIBRA) 134 MG capsule Take 134 mg by mouth at bedtime.   Fluticasone-Umeclidin-Vilant (TRELEGY ELLIPTA) 100-62.5-25 MCG/INH AEPB Inhale 1 puff daily into the lungs.    furosemide (LASIX) 80 MG tablet Take 80 mg by mouth daily.   glipiZIDE (GLUCOTROL XL) 5 MG 24 hr tablet Take 1 tablet (5 mg total) by mouth daily with breakfast.   glucose blood (ACCU-CHEK GUIDE) test strip Use as instructed 4 x daily. E11.65   Insulin Isophane & Regular Human (NOVOLIN 70/30 FLEXPEN) (70-30) 100 UNIT/ML PEN Inject 60 Units into the skin 2 (two) times daily.   Insulin Pen Needle (PEN NEEDLES) 32G X 6 MM MISC 1 each by Does not apply route  4 (four) times daily.   isosorbide mononitrate (IMDUR) 60 MG 24 hr tablet Take 1 tablet (60 mg total) by mouth 3 (three) times daily. Please make an appointment for additional refills (Patient taking differently: Take 60 mg by mouth 2 (two) times daily. Please make an appointment for additional refills)   LANCETS ULTRA FINE MISC 1 each by Does not apply route 4 (four) times daily.   lisinopril (PRINIVIL,ZESTRIL) 5 MG tablet Take 5 mg by mouth daily.   meclizine (ANTIVERT) 25 MG tablet Take 25 mg by mouth 2 (two) times daily as needed for dizziness.   metoprolol tartrate (LOPRESSOR) 100 MG tablet TAKE 1 TABLET BY MOUTH TWICE A DAY (Patient taking differently: Take 100 mg by mouth 2 (two) times daily. )   modafinil (PROVIGIL) 100 MG tablet Take 100 mg by mouth daily.    nitroGLYCERIN (NITROLINGUAL) 0.4 MG/SPRAY spray Place 1 spray under the tongue every 5 (five) minutes x 3 doses as needed for chest pain.   ONETOUCH DELICA LANCETS FINE MISC 1 each by Does not  apply route 4 (four) times daily. e11.65   potassium chloride SA (K-DUR,KLOR-CON) 20 MEQ tablet Take 1 tablet (20 mEq total) by mouth 2 (two) times daily.   Rosuvastatin Calcium (CRESTOR PO) Take by mouth daily. Pt unsure of strength   sitaGLIPtin (JANUVIA) 50 MG tablet Take 1 tablet (50 mg total) by mouth daily.   traMADol (ULTRAM) 50 MG tablet Take 1 tablet (50 mg total) by mouth every 6 (six) hours as needed.   umeclidinium-vilanterol (ANORO ELLIPTA) 62.5-25 MCG/INH AEPB Inhale 1 puff into the lungs every evening.    Vitamin D, Ergocalciferol, (DRISDOL) 50000 units CAPS capsule TAKE 1 CAPSULE BY MOUTH ONE TIME PER WEEK (Patient taking differently: Take 50,000 Units by mouth every Monday. )   [DISCONTINUED] pantoprazole (PROTONIX) 40 MG tablet TAKE 1 TABLET BY MOUTH 30 MINUTES BEFORE BREAKFAST (Patient taking differently: Take 40 mg by mouth daily before breakfast. )       Observations/Objective: No distress. Unable to perform physical exam due to telephone encounter. No video available.   Assessment and Plan: 58 year old male with history of Barrett's and dysphagia, s/p recent EGD with empiric dilatation. Currently, GERD symptoms not ideally controlled. Will increase to BID PPI for short course. Return for close follow-up in 3 months.   Follow Up Instructions: See AVS   I discussed the assessment and treatment plan with the patient. The patient was provided an opportunity to ask questions and all were answered. The patient agreed with the plan and demonstrated an understanding of the instructions.   The patient was advised to call back or seek an in-person evaluation if the symptoms worsen or if the condition fails to improve as anticipated.  I provided 10 minutes of non-face-to-face time during this encounter.  Annitta Needs, PhD, ANP-BC [facility]

## 2019-02-15 ENCOUNTER — Encounter: Payer: Self-pay | Admitting: Gastroenterology

## 2019-02-15 NOTE — Progress Notes (Signed)
cc'ed to pcp °

## 2019-02-22 DIAGNOSIS — J449 Chronic obstructive pulmonary disease, unspecified: Secondary | ICD-10-CM | POA: Diagnosis not present

## 2019-02-22 DIAGNOSIS — G4733 Obstructive sleep apnea (adult) (pediatric): Secondary | ICD-10-CM | POA: Diagnosis not present

## 2019-03-08 DIAGNOSIS — I10 Essential (primary) hypertension: Secondary | ICD-10-CM | POA: Diagnosis not present

## 2019-03-08 DIAGNOSIS — Z6841 Body Mass Index (BMI) 40.0 and over, adult: Secondary | ICD-10-CM | POA: Diagnosis not present

## 2019-03-08 DIAGNOSIS — E7849 Other hyperlipidemia: Secondary | ICD-10-CM | POA: Diagnosis not present

## 2019-03-08 DIAGNOSIS — E1165 Type 2 diabetes mellitus with hyperglycemia: Secondary | ICD-10-CM | POA: Diagnosis not present

## 2019-03-16 ENCOUNTER — Other Ambulatory Visit: Payer: Self-pay | Admitting: "Endocrinology

## 2019-03-26 ENCOUNTER — Other Ambulatory Visit: Payer: Self-pay | Admitting: Cardiology

## 2019-03-27 ENCOUNTER — Ambulatory Visit: Payer: Medicare HMO | Admitting: "Endocrinology

## 2019-04-02 ENCOUNTER — Other Ambulatory Visit: Payer: Self-pay | Admitting: "Endocrinology

## 2019-04-02 DIAGNOSIS — E1151 Type 2 diabetes mellitus with diabetic peripheral angiopathy without gangrene: Secondary | ICD-10-CM | POA: Diagnosis not present

## 2019-04-02 DIAGNOSIS — E1159 Type 2 diabetes mellitus with other circulatory complications: Secondary | ICD-10-CM | POA: Diagnosis not present

## 2019-04-03 LAB — COMPREHENSIVE METABOLIC PANEL
ALT: 42 IU/L (ref 0–44)
AST: 32 IU/L (ref 0–40)
Albumin/Globulin Ratio: 1.8 (ref 1.2–2.2)
Albumin: 4.2 g/dL (ref 3.8–4.9)
Alkaline Phosphatase: 92 IU/L (ref 39–117)
BUN/Creatinine Ratio: 6 — ABNORMAL LOW (ref 9–20)
BUN: 5 mg/dL — ABNORMAL LOW (ref 6–24)
Bilirubin Total: 0.4 mg/dL (ref 0.0–1.2)
CO2: 23 mmol/L (ref 20–29)
Calcium: 9 mg/dL (ref 8.7–10.2)
Chloride: 101 mmol/L (ref 96–106)
Creatinine, Ser: 0.84 mg/dL (ref 0.76–1.27)
GFR calc Af Amer: 113 mL/min/{1.73_m2} (ref >59)
GFR calc non Af Amer: 98 mL/min/{1.73_m2} (ref >59)
Globulin, Total: 2.4 g/dL (ref 1.5–4.5)
Glucose: 284 mg/dL — ABNORMAL HIGH (ref 65–99)
Potassium: 3.9 mmol/L (ref 3.5–5.2)
Sodium: 141 mmol/L (ref 134–144)
Total Protein: 6.6 g/dL (ref 6.0–8.5)

## 2019-04-03 LAB — HGB A1C W/O EAG: Hgb A1c MFr Bld: 7.3 % — ABNORMAL HIGH (ref 4.8–5.6)

## 2019-04-09 DIAGNOSIS — Z794 Long term (current) use of insulin: Secondary | ICD-10-CM | POA: Diagnosis not present

## 2019-04-09 DIAGNOSIS — H52203 Unspecified astigmatism, bilateral: Secondary | ICD-10-CM | POA: Diagnosis not present

## 2019-04-09 DIAGNOSIS — H524 Presbyopia: Secondary | ICD-10-CM | POA: Diagnosis not present

## 2019-04-09 DIAGNOSIS — E119 Type 2 diabetes mellitus without complications: Secondary | ICD-10-CM | POA: Diagnosis not present

## 2019-04-09 DIAGNOSIS — H25813 Combined forms of age-related cataract, bilateral: Secondary | ICD-10-CM | POA: Diagnosis not present

## 2019-04-10 ENCOUNTER — Encounter: Payer: Self-pay | Admitting: "Endocrinology

## 2019-04-10 ENCOUNTER — Ambulatory Visit (INDEPENDENT_AMBULATORY_CARE_PROVIDER_SITE_OTHER): Payer: Medicare HMO | Admitting: "Endocrinology

## 2019-04-10 ENCOUNTER — Other Ambulatory Visit: Payer: Self-pay

## 2019-04-10 VITALS — BP 120/86 | HR 83 | Ht 70.0 in | Wt 299.0 lb

## 2019-04-10 DIAGNOSIS — E1159 Type 2 diabetes mellitus with other circulatory complications: Secondary | ICD-10-CM

## 2019-04-10 DIAGNOSIS — E782 Mixed hyperlipidemia: Secondary | ICD-10-CM | POA: Diagnosis not present

## 2019-04-10 DIAGNOSIS — I10 Essential (primary) hypertension: Secondary | ICD-10-CM

## 2019-04-10 NOTE — Patient Instructions (Signed)

## 2019-04-10 NOTE — Progress Notes (Signed)
Endocrinology follow-up note   Subjective:    Patient ID: [name], male    DOB: Oct 17, 1961.  he is being seen in follow-up for management of currently uncontrolled symptomatic type 2 diabetes, hypertension, hyperlipidemia, obesity. PMD:  Sharilyn Sites, MD.   Past Medical History:  Diagnosis Date  . Abnormal myocardial perfusion study 01/01/2011   there a small to moderate sized inferobasal scar  . Barrett's esophagus   . Cataracts, bilateral   . Chronic kidney disease    hx of kidney stones  . Claudication ([place]) 11/16/2011   PV test perform shows normal  . COPD (chronic obstructive pulmonary disease) ([place])   . Depression   . Diabetes ([place])    type 2 diabetes mellitus  . Dysrhythmia   . GERD (gastroesophageal reflux disease)   . Glaucoma   . Hernia of abdominal wall   . HTN (hypertension)   . Hyperlipidemia   . Morbid obesity ([place])   . Myocardial infarction ([place]) 2008,2009,2009  . OSA (obstructive sleep apnea)    on cpap  . PAF (paroxysmal atrial fibrillation) ([place])   . S/P colonoscopy 2009   3-4 mm transverse colon erosions likely secondary to  ASA  . S/P endoscopy Dec 2011   moderate erosive gastritis, Barrett's esophagus 1-2cm  . Sleep apnea   . SOB (shortness of breath) 11/03/2007   2D Echo EF 50%-55%  . Stroke [facility])    Past Surgical History:  Procedure Laterality Date  . BIOPSY N/A 04/29/2015   Procedure: BIOPSY;  Surgeon: Danie Binder, MD;  Location: [facility];  Service: Endoscopy;  Laterality: N/A;  . BIOPSY  08/01/2018   Procedure: BIOPSY;  Surgeon: Danie Binder, MD;  Location: [facility];  Service: Endoscopy;;  esophageal  . CABG X 4  03/2008  . CARDIAC CATHETERIZATION  2009   stent placement to the left circumflex a 2.25   . CARDIAC CATHETERIZATION  07/08/2010  . COLONOSCOPY WITH PROPOFOL N/A 09/27/2017   normal ileum, twenty 4 to 8 mm polyps in the sigmoid colon, descending colon, splenic flexure, transverse colon, ascending colon,  cecum.  An additional three 2 to 4 mm polyps in the rectum and the descending colon.  Diverticulosis, internal hemorrhoids.  Surgical pathology found the polyps to be one fragment of hyperplastic polyp and 22 fragments of tubular adenoma..  Recommended repeat colonoscop  . CORONARY ARTERY BYPASS GRAFT  2008   4 vessels  . CORONARY STENT PLACEMENT  12/29/12  . CORONARY STENT PLACEMENT  12/2012  . ESOPHAGEAL DILATION N/A 04/29/2015   Procedure: ESOPHAGEAL DILATION 15 mm, 16 mm;  Surgeon: Danie Binder, MD;  Location: [facility];  Service: Endoscopy;  Laterality: N/A;  . ESOPHAGOGASTRODUODENOSCOPY  09/2011   Barrett's esophagus, no dysplasia on biopsies. Distal esophagitis. Status post dilation. Moderate gastritis and duodenitis, but biopsies benign. Next EGD in November 2015 for surveillance of Barrett's esophagus.  . ESOPHAGOGASTRODUODENOSCOPY (EGD) WITH PROPOFOL N/A 04/29/2015   SLF: 1. Barretts esophagus 2. Moderate non-erosive gastritis.   Marland Kitchen ESOPHAGOGASTRODUODENOSCOPY (EGD) WITH PROPOFOL N/A 08/01/2018   Barrett's, repeat in 5 years. Empiric dilatation, mild gastritis  . HERNIA REPAIR     ventral hernia repair  . LEFT HEART CATHETERIZATION WITH CORONARY/GRAFT ANGIOGRAM N/A 12/29/2012   Procedure: LEFT HEART CATHETERIZATION WITH Beatrix Fetters;  Surgeon: Troy Sine, MD;  Location: [facility] CATH LAB;  Service: Cardiovascular;  Laterality: N/A;  . ORIF FIBULA FRACTURE Right 06/03/2015   Procedure: OPEN REDUCTION INTERNAL FIXATION (ORIF) DISTAL  FIBULA  FRACTURE;  Surgeon: Garald Balding, MD;  Location: [facility];  Service: Orthopedics;  Laterality: Right;  . PERCUTANEOUS CORONARY STENT INTERVENTION (PCI-S)  12/29/2012   Procedure: PERCUTANEOUS CORONARY STENT INTERVENTION (PCI-S);  Surgeon: Troy Sine, MD;  Location: [facility] CATH LAB;  Service: Cardiovascular;;  . POLYPECTOMY  09/27/2017   Procedure: POLYPECTOMY;  Surgeon: Danie Binder, MD;  [facility];  Service: Endoscopy;;  cecal  polyp, ascending polyps x6, transverse colon polyps x6, splenic flexure polyps x2, descending colon polyps x6, sigmoid  colon polyp x1, rectal polyp x1   . SAVORY DILATION  09/06/2011   Procedure: SAVORY DILATION;  Surgeon: Dorothyann Peng, MD;  Location: [facility];  Service: Endoscopy;  Laterality: N/A;  Dilated with 53m  . SAVORY DILATION N/A 08/01/2018   Procedure: SAVORY DILATION;  Surgeon: FDanie Binder MD;  [facility];  Service: Endoscopy;  Laterality: N/A;   Social History   Socioeconomic History  . Marital status: Divorced    Spouse name: Not on file  . Number of children: Not on file  . Years of education: Not on file  . Highest education level: Not on file  Occupational History  . Not on file  Social Needs  . Financial resource strain: Not on file  . Food insecurity:    Worry: Not on file    Inability: Not on file  . Transportation needs:    Medical: Not on file    Non-medical: Not on file  Tobacco Use  . Smoking status: Current Every Day Smoker    Packs/day: 0.50    Years: 30.00    Pack years: 15.00    Types: Cigarettes  . Smokeless tobacco: Never Used  Substance and Sexual Activity  . Alcohol use: Yes    Comment: occ beer  . Drug use: No  . Sexual activity: Never  Lifestyle  . Physical activity:    Days per week: Not on file    Minutes per session: Not on file  . Stress: Not on file  Relationships  . Social connections:    Talks on phone: Not on file    Gets together: Not on file    Attends religious service: Not on file    Active member of club or organization: Not on file    Attends meetings of clubs or organizations: Not on file    Relationship status: Not on file  Other Topics Concern  . Not on file  Social History Narrative  . Not on file   Outpatient Encounter Medications as of 04/10/2019  Medication Sig  . albuterol (PROVENTIL HFA;VENTOLIN HFA) 108 (90 Base) MCG/ACT inhaler Inhale 2 puffs into the lungs every 4 (four) hours as  needed for wheezing or shortness of breath.  .Marland KitchenamLODipine (NORVASC) 5 MG tablet TAKE 1 TABLET BY MOUTH EVERY DAY (Patient taking differently: Take 5 mg by mouth daily. )  . apixaban (ELIQUIS) 5 MG TABS tablet Take 1 tablet (5 mg total) by mouth 2 (two) times daily.  . B-D ULTRAFINE III SHORT PEN 31G X 8 MM MISC USE 1 PEN NEEDLE 4 TIMES DAILY  . Blood Glucose Monitoring Suppl (ACCU-CHEK GUIDE) w/Device KIT 1 each by Does not apply route 4 (four) times daily.  . diazepam (VALIUM) 5 MG tablet Take 5 mg by mouth 2 (two) times daily.  .Marland Kitchenescitalopram (LEXAPRO) 20 MG tablet Take 20 mg daily by mouth.   . fenofibrate micronized (LOFIBRA) 134 MG capsule Take  134 mg by mouth at bedtime.  . Fluticasone-Umeclidin-Vilant (TRELEGY ELLIPTA) 100-62.5-25 MCG/INH AEPB Inhale 1 puff daily into the lungs.   . furosemide (LASIX) 80 MG tablet Take 80 mg by mouth daily.  Marland Kitchen glipiZIDE (GLUCOTROL XL) 5 MG 24 hr tablet Take 1 tablet (5 mg total) by mouth daily with breakfast.  . glucose blood (ACCU-CHEK GUIDE) test strip Use as instructed 4 x daily. E11.65  . Insulin Isophane & Regular Human (NOVOLIN 70/30 FLEXPEN) (70-30) 100 UNIT/ML PEN Inject 60 Units into the skin 2 (two) times daily.  . Insulin Pen Needle (PEN NEEDLES) 32G X 6 MM MISC 1 each by Does not apply route 4 (four) times daily.  . isosorbide mononitrate (IMDUR) 60 MG 24 hr tablet Take 1 tablet (60 mg total) by mouth 3 (three) times daily. Please make an appointment for additional refills (Patient taking differently: Take 60 mg by mouth 2 (two) times daily. Please make an appointment for additional refills)  . LANCETS ULTRA FINE MISC 1 each by Does not apply route 4 (four) times daily.  Marland Kitchen lisinopril (PRINIVIL,ZESTRIL) 5 MG tablet Take 5 mg by mouth daily.  . meclizine (ANTIVERT) 25 MG tablet Take 25 mg by mouth 2 (two) times daily as needed for dizziness.  . metoprolol tartrate (LOPRESSOR) 100 MG tablet TAKE 1 TABLET BY MOUTH TWICE A DAY (Patient taking  differently: Take 100 mg by mouth 2 (two) times daily. )  . modafinil (PROVIGIL) 100 MG tablet Take 100 mg by mouth daily.   . nitroGLYCERIN (NITROLINGUAL) 0.4 MG/SPRAY spray Place 1 spray under the tongue every 5 (five) minutes x 3 doses as needed for chest pain.  Glory Rosebush DELICA LANCETS FINE MISC 1 each by Does not apply route 4 (four) times daily. e11.65  . pantoprazole (PROTONIX) 40 MG tablet Take 1 tablet (40 mg total) by mouth 2 (two) times daily before a meal.  . potassium chloride SA (K-DUR,KLOR-CON) 20 MEQ tablet Take 1 tablet (20 mEq total) by mouth 2 (two) times daily.  . Rosuvastatin Calcium (CRESTOR PO) Take by mouth daily. Pt unsure of strength  . sitaGLIPtin (JANUVIA) 50 MG tablet Take 1 tablet (50 mg total) by mouth daily.  . traMADol (ULTRAM) 50 MG tablet Take 1 tablet (50 mg total) by mouth every 6 (six) hours as needed.  . umeclidinium-vilanterol (ANORO ELLIPTA) 62.5-25 MCG/INH AEPB Inhale 1 puff into the lungs every evening.   . Vitamin D, Ergocalciferol, (DRISDOL) 50000 units CAPS capsule TAKE 1 CAPSULE BY MOUTH ONE TIME PER WEEK (Patient taking differently: Take 50,000 Units by mouth every Monday. )   No facility-administered encounter medications on file as of 04/10/2019.     ALLERGIES: Allergies  Allergen Reactions  . Contrast Media [Iodinated Diagnostic Agents] Other (See Comments)    Pt must be premedicated before given contrast media - stops heart  . Iohexol Other (See Comments)     Consult with radiologist before pre meds are given.Desc: PT. STATES HEART STOPPED HAS TO BE PREMED.     VACCINATION STATUS:  There is no immunization history on file for this patient.  Diabetes  He presents for his follow-up diabetic visit. He has type 2 diabetes mellitus. Onset time: He was diagnosed at approximate age of 89 years. His disease course has been improving. There are no hypoglycemic associated symptoms. Pertinent negatives for hypoglycemia include no confusion,  headaches, pallor or seizures. Pertinent negatives for diabetes include no blurred vision, no chest pain, no fatigue, no polydipsia, no polyphagia,  no polyuria and no weakness. There are no hypoglycemic complications. Symptoms are improving. Diabetic complications include a CVA and heart disease. Risk factors for coronary artery disease include diabetes mellitus, dyslipidemia, hypertension, male sex, obesity, tobacco exposure, sedentary lifestyle and family history. Current diabetic treatments: He was supposed to be on basal insulin, unfortunately he ran out and could not afford refills and he did not call clinic for alternatives. He is compliant with treatment none of the time. His weight is fluctuating minimally. He is following a generally unhealthy diet. When asked about meal planning, he reported none. He has not had a previous visit with a dietitian. He never participates in exercise. His home blood glucose trend is increasing steadily. His breakfast blood glucose range is generally 180-200 mg/dl. His dinner blood glucose range is generally 180-200 mg/dl. His overall blood glucose range is 180-200 mg/dl. (She comes in with no meter nor logs to review.  His A1c is 10.4% increasing from  7.6%.) An ACE inhibitor/angiotensin II receptor blocker is being taken. He does not see a podiatrist.Eye exam is not current.  Hyperlipidemia  This is a chronic problem. The current episode started more than 1 year ago. The problem is uncontrolled. Recent lipid tests were reviewed and are high. Exacerbating diseases include diabetes and obesity. Pertinent negatives include no chest pain, myalgias or shortness of breath. Current antihyperlipidemic treatment includes statins. Compliance problems include medication cost and psychosocial issues.  Risk factors for coronary artery disease include dyslipidemia, diabetes mellitus, hypertension, male sex, obesity, family history and a sedentary lifestyle.  Hypertension  This is a  chronic problem. The current episode started more than 1 year ago. The problem is uncontrolled. Pertinent negatives include no blurred vision, chest pain, headaches, neck pain, palpitations or shortness of breath. Risk factors for coronary artery disease include diabetes mellitus, dyslipidemia, male gender, obesity, family history, sedentary lifestyle and smoking/tobacco exposure. Past treatments include ACE inhibitors. Hypertensive end-organ damage includes CVA.    Review of Systems  Constitutional: Negative for chills, fatigue, fever and unexpected weight change.  HENT: Negative for dental problem, mouth sores and trouble swallowing.   Eyes: Negative for blurred vision and visual disturbance.  Respiratory: Negative for cough, choking, chest tightness, shortness of breath and wheezing.   Cardiovascular: Negative for chest pain, palpitations and leg swelling.  Gastrointestinal: Negative for abdominal distention, abdominal pain, constipation, diarrhea, nausea and vomiting.  Endocrine: Negative for polydipsia, polyphagia and polyuria.  Genitourinary: Negative for dysuria, flank pain, hematuria and urgency.  Musculoskeletal: Negative for back pain, gait problem, myalgias and neck pain.  Skin: Negative for pallor, rash and wound.  Neurological: Negative for seizures, syncope, weakness, numbness and headaches.  Psychiatric/Behavioral: Negative.  Negative for confusion and dysphoric mood.    Objective:    BP 120/86   Pulse 83   Ht _0  (1.778 m)   Wt 299 lb (135.6 kg)   BMI 42.90 kg/m   Wt Readings from Last 3 Encounters:  04/10/19 299 lb (135.6 kg)  01/30/19 293 lb (132.9 kg)  12/26/18 (!) 303 lb (137.4 kg)     Physical Exam  Constitutional: He is oriented to person, place, and time. He appears well-developed. He is cooperative. No distress.  Poor personal hygiene.  HENT:  Head: Normocephalic and atraumatic.  Eyes: EOM are normal.  Neck: Normal range of motion. Neck supple. No  tracheal deviation present. No thyromegaly present.  Cardiovascular: Normal rate, S1 normal and S2 normal. Exam reveals no gallop.  No murmur heard. Pulses:  Dorsalis pedis pulses are 1+ on the right side and 1+ on the left side.       Posterior tibial pulses are 1+ on the right side and 1+ on the left side.  Pulmonary/Chest: Effort normal. No respiratory distress. He has no wheezes.  Abdominal: He exhibits no distension. There is no abdominal tenderness. There is no guarding and no CVA tenderness.  Musculoskeletal:        General: No edema.     Right shoulder: He exhibits no swelling and no deformity.  Neurological: He is alert and oriented to person, place, and time. He has normal strength and normal reflexes. No cranial nerve deficit or sensory deficit. Gait normal.  Skin: Skin is warm and dry. No rash noted. No cyanosis. Nails show no clubbing.  Psychiatric: His speech is normal. Cognition and memory are normal.  Reluctant affect. Unconcerned attitude.   CMP ( most recent) CMP     Component Value Date/Time   NA 141 04/02/2019 1404   K 3.9 04/02/2019 1404   CL 101 04/02/2019 1404   CO2 23 04/02/2019 1404   GLUCOSE 284 (H) 04/02/2019 1404   GLUCOSE 209 (H) 01/30/2019 1320   BUN 5 (L) 04/02/2019 1404   CREATININE 0.84 04/02/2019 1404   CALCIUM 9.0 04/02/2019 1404   PROT 6.6 04/02/2019 1404   ALBUMIN 4.2 04/02/2019 1404   AST 32 04/02/2019 1404   ALT 42 04/02/2019 1404   ALKPHOS 92 04/02/2019 1404   BILITOT 0.4 04/02/2019 1404   GFRNONAA 98 04/02/2019 1404   GFRAA 113 04/02/2019 1404   Diabetic Labs (most recent): Lab Results  Component Value Date   HGBA1C 7.3 (H) 04/02/2019   HGBA1C 8.2 (H) 12/01/2018   HGBA1C 8.9 (H) 07/31/2018     Lipid Panel ( most recent) Lipid Panel     Component Value Date/Time   CHOL 137 11/18/2017 0823   TRIG 469 (H) 11/18/2017 0823   HDL 28 (L) 11/18/2017 0823   CHOLHDL 6.1 04/28/2014 0121   VLDL 61 (H) 04/28/2014 0121   LDLCALC  Comment 11/18/2017 0823     Assessment & Plan:   1. DM type 2 causing vascular disease ([place])  - Patient has currently uncontrolled symptomatic type 2 DM since  58 years of age. -Patient returns with improved A1c of 7.3%, overall improving from 10.4%.    -his diabetes is complicated by coronary artery disease, CVA, obesity/sedentary life, chronic heavy smoking and WON KREUZER remains at extremely high risk  for more acute and chronic complications which include CAD, CVA, CKD, retinopathy, and neuropathy. These are all discussed in detail with the patient.  - I have counseled him on diet management and weight loss, by adopting a carbohydrate restricted/protein rich diet.  - he  admits there is a room for improvement in his diet and drink choices. -  Suggestion is made for him to avoid simple carbohydrates  from his diet including Cakes, Sweet Desserts / Pastries, Ice Cream, Soda (diet and regular), Sweet Tea, Candies, Chips, Cookies, Sweet Pastries,  Store Bought Juices, Alcohol in Excess of  1-2 drinks a day, Artificial Sweeteners, Coffee Creamer, and "Sugar-free" Products. This will help patient to have stable blood glucose profile and potentially avoid unintended weight gain.  - I encouraged him to switch to  unprocessed or minimally processed complex starch and increased protein intake (animal or plant source), fruits, and vegetables.  - he is advised to stick to a routine mealtimes to eat 3 meals  a day and avoid unnecessary snacks ( to snack only to correct hypoglycemia).   - I have approached him with the following individualized plan to manage diabetes and patient agrees:   - He has significant cognitive deficit, for simplicity reasons he will be kept on premixed insulin.  - Besides, he could not afford the co-pays for insulin analogs.    -He is advised to continue   Novolin 70/30 60 units with breakfast and 60 units with supper  for pre-meal glucose readings above 90 mg/dL.    -He is advised to continue Januvia 50 mg p.o. daily with breakfast.  -He has benefited from glipizide treatment.  He is advised to continueGlucotrol 5 mg XL daily at breakfast . -He does not tolerate metformin due to GI side effects.   -Patient is encouraged to call clinic for blood glucose levels less than 70 or above 300 mg /dl.  - Patient specific target  A1c;  LDL, HDL, Triglycerides, and  Waist Circumference were discussed in detail.  2) BP/HTN: His blood pressure is controlled to target.  He continues to smoke heavily, he is counseled extensively against smoking.  He is advised to continue his current blood pressure medications consistently including lisinopril 5 mg p.o. daily.   3) Lipids/HPL: His  lipid panel is not controlled  with severe hypertriglyceridemia   of  469.  He is advised to continue Trilipix 135 mg p.o. nightly.    4)  Weight/Diet: CDE Consult has been  initiated , exercise, and detailed carbohydrates information provided.  5) Chronic Care/Health Maintenance:  -he  is on ACEI/ARB and Statin medications and  is encouraged to continue to follow up with Ophthalmology, Dentist,  Podiatrist at least yearly or according to recommendations, and advised to  quit smoking. I have recommended yearly flu vaccine and pneumonia vaccination at least every 5 years; moderate intensity exercise for up to 150 minutes weekly; and  sleep for at least 7 hours a day. -He is consulted for smoking cessation.  - I advised patient to maintain close follow up with Sharilyn Sites, MD for primary care needs.  - Time spent with the patient: 25 min, of which >50% was spent in reviewing his blood glucose logs , discussing his hypoglycemia and hyperglycemia episodes, reviewing his current and  previous labs / studies and medications  doses and developing a plan to avoid hypoglycemia and hyperglycemia. Please refer to Patient Instructions for Blood Glucose Monitoring and Insulin/Medications Dosing Guide"   in media tab for additional information. Please  also refer to " Patient Self Inventory" in the Media  tab for reviewed elements of pertinent patient history.  [name] participated in the discussions, expressed understanding, and voiced agreement with the above plans.  All questions were answered to his satisfaction. he is encouraged to contact clinic should he have any questions or concerns prior to his return visit.   Follow up plan: - Return in about 4 months (around 08/10/2019) for Follow up with Pre-visit Labs, Meter, and Logs.   Glade Lloyd, MD Phone: [phone]  Fax: [phone]   04/10/2019, 3:17 PM   This note was partially dictated with voice recognition software. Similar sounding words can be transcribed inadequately or may not  be corrected upon review.

## 2019-04-18 DIAGNOSIS — I517 Cardiomegaly: Secondary | ICD-10-CM | POA: Diagnosis not present

## 2019-04-18 DIAGNOSIS — I082 Rheumatic disorders of both aortic and tricuspid valves: Secondary | ICD-10-CM | POA: Diagnosis not present

## 2019-04-18 DIAGNOSIS — Z951 Presence of aortocoronary bypass graft: Secondary | ICD-10-CM | POA: Diagnosis not present

## 2019-04-18 DIAGNOSIS — I519 Heart disease, unspecified: Secondary | ICD-10-CM | POA: Diagnosis not present

## 2019-05-17 ENCOUNTER — Ambulatory Visit: Payer: Medicare HMO | Admitting: Gastroenterology

## 2019-05-18 ENCOUNTER — Other Ambulatory Visit: Payer: Medicare HMO

## 2019-05-18 ENCOUNTER — Other Ambulatory Visit: Payer: Self-pay

## 2019-05-18 DIAGNOSIS — Z20822 Contact with and (suspected) exposure to covid-19: Secondary | ICD-10-CM

## 2019-05-18 DIAGNOSIS — R6889 Other general symptoms and signs: Secondary | ICD-10-CM | POA: Diagnosis not present

## 2019-05-22 LAB — NOVEL CORONAVIRUS, NAA: SARS-CoV-2, NAA: NOT DETECTED

## 2019-05-23 DIAGNOSIS — J449 Chronic obstructive pulmonary disease, unspecified: Secondary | ICD-10-CM | POA: Diagnosis not present

## 2019-05-23 DIAGNOSIS — G4733 Obstructive sleep apnea (adult) (pediatric): Secondary | ICD-10-CM | POA: Diagnosis not present

## 2019-05-31 ENCOUNTER — Other Ambulatory Visit: Payer: Self-pay | Admitting: Cardiology

## 2019-06-21 ENCOUNTER — Other Ambulatory Visit: Payer: Self-pay | Admitting: "Endocrinology

## 2019-06-23 DIAGNOSIS — J449 Chronic obstructive pulmonary disease, unspecified: Secondary | ICD-10-CM | POA: Diagnosis not present

## 2019-06-23 DIAGNOSIS — G4733 Obstructive sleep apnea (adult) (pediatric): Secondary | ICD-10-CM | POA: Diagnosis not present

## 2019-06-26 ENCOUNTER — Other Ambulatory Visit: Payer: Self-pay | Admitting: Cardiology

## 2019-07-03 ENCOUNTER — Other Ambulatory Visit: Payer: Self-pay

## 2019-07-03 ENCOUNTER — Other Ambulatory Visit: Payer: Self-pay | Admitting: "Endocrinology

## 2019-07-03 ENCOUNTER — Ambulatory Visit: Payer: Medicare HMO | Admitting: Gastroenterology

## 2019-07-03 ENCOUNTER — Encounter: Payer: Self-pay | Admitting: Gastroenterology

## 2019-07-03 DIAGNOSIS — E782 Mixed hyperlipidemia: Secondary | ICD-10-CM | POA: Diagnosis not present

## 2019-07-03 DIAGNOSIS — E1159 Type 2 diabetes mellitus with other circulatory complications: Secondary | ICD-10-CM | POA: Diagnosis not present

## 2019-07-03 DIAGNOSIS — R5383 Other fatigue: Secondary | ICD-10-CM | POA: Diagnosis not present

## 2019-07-03 DIAGNOSIS — R197 Diarrhea, unspecified: Secondary | ICD-10-CM | POA: Diagnosis not present

## 2019-07-03 DIAGNOSIS — E1151 Type 2 diabetes mellitus with diabetic peripheral angiopathy without gangrene: Secondary | ICD-10-CM | POA: Diagnosis not present

## 2019-07-03 DIAGNOSIS — E559 Vitamin D deficiency, unspecified: Secondary | ICD-10-CM | POA: Diagnosis not present

## 2019-07-03 MED ORDER — LOPERAMIDE HCL 2 MG PO TABS: 2.0000 mg | ORAL_TABLET | Freq: Two times a day (BID) | ORAL | 3 refills | Status: DC | PRN

## 2019-07-03 NOTE — Patient Instructions (Addendum)
Let's try taking Imodium one tablet each morning. Follow a lactose-free diet for the next few weeks. Let me know how this works for you.  We are arranging a colonoscopy with Dr. Oneida Alar in the future. You will not take your glucotrol or januvia that morning.  Please stop Eliquis 2 days before the colonoscopy.  Further recommendations to follow!  I enjoyed seeing you again today! As you know, I value our relationship and want to provide genuine, compassionate, and quality care. I welcome your feedback. If you receive a survey regarding your visit,  I greatly appreciate you taking time to fill this out. See you next time!  Annitta Needs, PhD, ANP-BC [facility]   Lactose-Free Diet, Adult If you have lactose intolerance, you are not able to digest lactose. Lactose is a natural sugar found mainly in dairy milk and dairy products. You may need to avoid all foods and beverages that contain lactose. A lactose-free diet can help you do this. Which foods have lactose? Lactose is found in dairy milk and dairy products, such as:  Yogurt.  Cheese.  Butter.  Margarine.  Sour cream.  Cream.  Whipped toppings and nondairy creamers.  Ice cream and other dairy-based desserts. Lactose is also found in foods or products made with dairy milk or milk ingredients. To find out whether a food contains dairy milk or a milk ingredient, look at the ingredients list. Avoid foods with the statement "May contain milk" and foods that contain:  Milk powder.  Whey.  Curd.  Caseinate.  Lactose.  Lactalbumin.  Lactoglobulin. What are alternatives to dairy milk and foods made with milk products?  Lactose-free milk.  Soy milk with added calcium and vitamin D.  Almond milk, coconut milk, rice milk, or other nondairy milk alternatives with added calcium and vitamin D. Note that these are low in protein.  Soy products, such as soy yogurt, soy cheese, soy ice cream, and soy-based sour  cream.  Other nut milk products, such as almond yogurt, almond cheese, cashew yogurt, cashew cheese, cashew ice cream, coconut yogurt, and coconut ice cream. What are tips for following this plan?  Do not consume foods, beverages, vitamins, minerals, or medicines containing lactose. Read ingredient lists carefully.  Look for the words "lactose-free" on labels.  Use lactase enzyme drops or tablets as directed by your health care provider.  Use lactose-free milk or a milk alternative, such as soy milk or almond milk, for drinking and cooking.  Make sure you get enough calcium and vitamin D in your diet. A lactose-free eating plan can be lacking in these important nutrients.  Take calcium and vitamin D supplements as directed by your health care provider. Talk to your health care provider about supplements if you are not able to get enough calcium and vitamin D from food. What foods can I eat?  Fruits All fresh, canned, frozen, or dried fruits that are not processed with lactose. Vegetables All fresh, frozen, and canned vegetables without cheese, cream, or butter sauces. Grains Any that are not made with dairy milk or dairy products. Meats and other proteins Any meat, fish, poultry, and other protein sources that are not made with dairy milk or dairy products. Soy cheese and yogurt. Fats and oils Any that are not made with dairy milk or dairy products. Beverages Lactose-free milk. Soy, rice, or almond milk with added calcium and vitamin D. Fruit and vegetable juices. Sweets and desserts Any that are not made with dairy milk or dairy  products. Seasonings and condiments Any that are not made with dairy milk or dairy products. Calcium Calcium is found in many foods that contain lactose and is important for bone health. The amount of calcium you need depends on your age:  Adults younger than 50 years: 1,000 mg of calcium a day.  Adults older than 50 years: 1,200 mg of calcium a  day. If you are not getting enough calcium, you may get it from other sources, including:  Orange juice with calcium added. There are 300-350 mg of calcium in 1 cup of orange juice.  Calcium-fortified soy milk. There are 300-400 mg of calcium in 1 cup of calcium-fortified soy milk.  Calcium-fortified rice or almond milk. There are 300 mg of calcium in 1 cup of calcium-fortified rice or almond milk.  Calcium-fortified breakfast cereals. There are 100-1,000 mg of calcium in calcium-fortified breakfast cereals.  Spinach, cooked. There are 145 mg of calcium in  cup of cooked spinach.  Edamame, cooked. There are 130 mg of calcium in  cup of cooked edamame.  Collard greens, cooked. There are 125 mg of calcium in  cup of cooked collard greens.  Kale, frozen or cooked. There are 90 mg of calcium in  cup of cooked or frozen kale.  Almonds. There are 95 mg of calcium in  cup of almonds.  Broccoli, cooked. There are 60 mg of calcium in 1 cup of cooked broccoli. The items listed above may not be a complete list of recommended foods and beverages. Contact a dietitian for more options. What foods are not recommended? Fruits None, unless they are made with dairy milk or dairy products. Vegetables None, unless they are made with dairy milk or dairy products. Grains Any grains that are made with dairy milk or dairy products. Meats and other proteins None, unless they are made with dairy milk or dairy products. Dairy All dairy products, including milk, goat's milk, buttermilk, kefir, acidophilus milk, flavored milk, evaporated milk, condensed milk, dulce de [place], eggnog, yogurt, cheese, and cheese spreads. Fats and oils Any that are made with milk or milk products. Margarines and salad dressings that contain milk or cheese. Cream. Half and half. Cream cheese. Sour cream. Chip dips made with sour cream or yogurt. Beverages Hot chocolate. Cocoa with lactose. Instant iced teas. Powdered fruit  drinks. Smoothies made with dairy milk or yogurt. Sweets and desserts Any that are made with milk or milk products. Seasonings and condiments Chewing gum that has lactose. Spice blends if they contain lactose. Artificial sweeteners that contain lactose. Nondairy creamers. The items listed above may not be a complete list of foods and beverages to avoid. Contact a dietitian for more information. Summary  If you are lactose intolerant, it means that you have a hard time digesting lactose, a natural sugar found in milk and milk products.  Following a lactose-free diet can help you manage this condition.  Calcium is important for bone health and is found in many foods that contain lactose. Talk with your health care provider about other sources of calcium. This information is not intended to replace advice given to you by your health care provider. Make sure you discuss any questions you have with your health care provider. Document Released: 04/09/2002 Document Revised: 11/15/2017 Document Reviewed: 11/15/2017 Elsevier Patient Education  2020 [employer].

## 2019-07-03 NOTE — Progress Notes (Signed)
Referring Provider: Sharilyn Sites, MD Primary Care Physician:  Sharilyn Sites, MD Primary GI: Dr. Oneida Alar   Chief Complaint  Patient presents with  . Diarrhea    5-6 times in 3 hr period, more even throughout the day, no blood in stool. reports only eating 1 time a day d/t the diarrhea  . Gastroesophageal Reflux    HPI:   [name] is a 58 y.o. male presenting today with a history of Barrett's, with recent EGD completed. Empiric dilatation also performed at that time. Surveillance colonoscopy due to multiple colon polyps due in 2021.   He notes an 8 month history of frequent stools, having between 4-6 in a several hour period in the mornings, then throughout the day will have several more. Worsening over past 8 months. Has postprandial diarrhea. Unable to attribute to foods.  Eats once a day to avoid this. No rectal bleeding. Stool varies between solid and watery, sometimes mixed. No abdominal pain. No weight loss. Eliquis BID. No recent antibiotics. A1c 7.3 several months ago. Was 10 about a year ago.   Past Medical History:  Diagnosis Date  . Abnormal myocardial perfusion study 01/01/2011   there a small to moderate sized inferobasal scar  . Barrett's esophagus   . Cataracts, bilateral   . Chronic kidney disease    hx of kidney stones  . Claudication ([place]) 11/16/2011   PV test perform shows normal  . COPD (chronic obstructive pulmonary disease) ([place])   . Depression   . Diabetes ([place])    type 2 diabetes mellitus  . Dysrhythmia   . GERD (gastroesophageal reflux disease)   . Glaucoma   . Hernia of abdominal wall   . HTN (hypertension)   . Hyperlipidemia   . Morbid obesity ([place])   . Myocardial infarction ([place]) 2008,2009,2009  . OSA (obstructive sleep apnea)    on cpap  . PAF (paroxysmal atrial fibrillation) ([place])   . S/P colonoscopy 2009   3-4 mm transverse colon erosions likely secondary to  ASA  . S/P endoscopy Dec 2011   moderate erosive gastritis, Barrett's  esophagus 1-2cm  . Sleep apnea   . SOB (shortness of breath) 11/03/2007   2D Echo EF 50%-55%  . Stroke [facility])     Past Surgical History:  Procedure Laterality Date  . BIOPSY N/A 04/29/2015   Procedure: BIOPSY;  Surgeon: Danie Binder, MD;  Location: [facility] ORS;  Service: Endoscopy;  Laterality: N/A;  . BIOPSY  08/01/2018   Procedure: BIOPSY;  Surgeon: Danie Binder, MD;  Location: [facility] ENDO SUITE;  Service: Endoscopy;;  esophageal  . CABG X 4  03/2008  . CARDIAC CATHETERIZATION  2009   stent placement to the left circumflex a 2.25   . CARDIAC CATHETERIZATION  07/08/2010  . COLONOSCOPY WITH PROPOFOL N/A 09/27/2017   normal ileum, twenty 4 to 8 mm polyps in the sigmoid colon, descending colon, splenic flexure, transverse colon, ascending colon, cecum.  An additional three 2 to 4 mm polyps in the rectum and the descending colon.  Diverticulosis, internal hemorrhoids.  Surgical pathology found the polyps to be one fragment of hyperplastic polyp and 22 fragments of tubular adenoma..  Recommended repeat colonoscop  . CORONARY ARTERY BYPASS GRAFT  2008   4 vessels  . CORONARY STENT PLACEMENT  12/29/12  . CORONARY STENT PLACEMENT  12/2012  . ESOPHAGEAL DILATION N/A 04/29/2015   Procedure: ESOPHAGEAL DILATION 15 mm, 16 mm;  Surgeon: Danie Binder, MD;  Location: [facility]  ORS;  Service: Endoscopy;  Laterality: N/A;  . ESOPHAGOGASTRODUODENOSCOPY  09/2011   Barrett's esophagus, no dysplasia on biopsies. Distal esophagitis. Status post dilation. Moderate gastritis and duodenitis, but biopsies benign. Next EGD in November 2015 for surveillance of Barrett's esophagus.  . ESOPHAGOGASTRODUODENOSCOPY (EGD) WITH PROPOFOL N/A 04/29/2015   SLF: 1. Barretts esophagus 2. Moderate non-erosive gastritis.   Marland Kitchen ESOPHAGOGASTRODUODENOSCOPY (EGD) WITH PROPOFOL N/A 08/01/2018   Barrett's, repeat in 5 years. Empiric dilatation, mild gastritis  . HERNIA REPAIR     ventral hernia repair  . LEFT HEART CATHETERIZATION WITH  CORONARY/GRAFT ANGIOGRAM N/A 12/29/2012   Procedure: LEFT HEART CATHETERIZATION WITH Beatrix Fetters;  Surgeon: Troy Sine, MD;  Location: [facility] CATH LAB;  Service: Cardiovascular;  Laterality: N/A;  . ORIF FIBULA FRACTURE Right 06/03/2015   Procedure: OPEN REDUCTION INTERNAL FIXATION (ORIF) DISTAL FIBULA  FRACTURE;  Surgeon: Garald Balding, MD;  Location: [place];  Service: Orthopedics;  Laterality: Right;  . PERCUTANEOUS CORONARY STENT INTERVENTION (PCI-S)  12/29/2012   Procedure: PERCUTANEOUS CORONARY STENT INTERVENTION (PCI-S);  Surgeon: Troy Sine, MD;  Location: [facility] CATH LAB;  Service: Cardiovascular;;  . POLYPECTOMY  09/27/2017   Procedure: POLYPECTOMY;  Surgeon: Danie Binder, MD;  Location: [facility];  Service: Endoscopy;;  cecal polyp, ascending polyps x6, transverse colon polyps x6, splenic flexure polyps x2, descending colon polyps x6, sigmoid  colon polyp x1, rectal polyp x1   . SAVORY DILATION  09/06/2011   Procedure: SAVORY DILATION;  Surgeon: Dorothyann Peng, MD;  Location: [facility];  Service: Endoscopy;  Laterality: N/A;  Dilated with 11mm  . SAVORY DILATION N/A 08/01/2018   Procedure: SAVORY DILATION;  Surgeon: Danie Binder, MD;  Location[facility];  Service: Endoscopy;  Laterality: N/A;    Current Outpatient Medications  Medication Sig Dispense Refill  . albuterol (PROVENTIL HFA;VENTOLIN HFA) 108 (90 Base) MCG/ACT inhaler Inhale 2 puffs into the lungs every 4 (four) hours as needed for wheezing or shortness of breath. 1 Inhaler 1  . apixaban (ELIQUIS) 5 MG TABS tablet Take 1 tablet (5 mg total) by mouth 2 (two) times daily. 1 tablet 0  . diazepam (VALIUM) 5 MG tablet Take 5 mg by mouth 2 (two) times daily.    Marland Kitchen escitalopram (LEXAPRO) 20 MG tablet Take 20 mg daily by mouth.     . fenofibrate micronized (LOFIBRA) 134 MG capsule Take 134 mg by mouth at bedtime.    . Fluticasone-Umeclidin-Vilant (TRELEGY ELLIPTA) 100-62.5-25 MCG/INH AEPB Inhale 1 puff daily  into the lungs.     . furosemide (LASIX) 80 MG tablet Take 80 mg by mouth daily.  4  . glipiZIDE (GLUCOTROL XL) 5 MG 24 hr tablet Take 1 tablet (5 mg total) by mouth daily with breakfast. 30 tablet 3  . Insulin Isophane & Regular Human (NOVOLIN 70/30 FLEXPEN) (70-30) 100 UNIT/ML PEN Inject 60 Units into the skin 2 (two) times daily. 30 mL 2  . isosorbide mononitrate (IMDUR) 60 MG 24 hr tablet Take 1 tablet (60 mg total) by mouth 3 (three) times daily. Please make an appointment for additional refills (Patient taking differently: Take 60 mg by mouth daily. ) 45 tablet 0  . lisinopril (PRINIVIL,ZESTRIL) 5 MG tablet Take 5 mg by mouth daily.    . meclizine (ANTIVERT) 25 MG tablet Take 25 mg by mouth 2 (two) times daily as needed for dizziness.    . metoprolol tartrate (LOPRESSOR) 100 MG tablet Take 1 tablet by mouth twice daily 15  tablet 0  . modafinil (PROVIGIL) 100 MG tablet Take 100-200 mg by mouth daily.     . nitroGLYCERIN (NITROLINGUAL) 0.4 MG/SPRAY spray Place 1 spray under the tongue every 5 (five) minutes x 3 doses as needed for chest pain. 12 g 0  . pantoprazole (PROTONIX) 40 MG tablet Take 1 tablet (40 mg total) by mouth 2 (two) times daily before a meal. 60 tablet 3  . potassium chloride SA (K-DUR,KLOR-CON) 20 MEQ tablet Take 1 tablet (20 mEq total) by mouth 2 (two) times daily. 30 tablet 0  . rosuvastatin (CRESTOR) 20 MG tablet Take 1 tablet by mouth daily.     . sitaGLIPtin (JANUVIA) 50 MG tablet Take 1 tablet (50 mg total) by mouth daily. 50 tablet 2  . loperamide (IMODIUM A-D) 2 MG tablet Take 1 tablet (2 mg total) by mouth 2 (two) times daily as needed for diarrhea or loose stools. 60 tablet 3   No current facility-administered medications for this visit.     Allergies as of 07/03/2019 - Review Complete 07/03/2019  Allergen Reaction Noted  . Contrast media [iodinated diagnostic agents] Other (See Comments) 03/14/2013  . Iohexol Other (See Comments) 01/02/2008    Family History   Problem Relation Age of Onset  . Diabetes Mother   . Heart disease Mother   . Diabetes Father   . Heart disease Father   . Pancreatic cancer Brother        age 61, doing well  . Colon cancer Neg Hx   . Anesthesia problems Neg Hx   . Hypotension Neg Hx   . Malignant hyperthermia Neg Hx   . Pseudochol deficiency Neg Hx     Social History   Socioeconomic History  . Marital status: Divorced    Spouse name: Not on file  . Number of children: Not on file  . Years of education: Not on file  . Highest education level: Not on file  Occupational History  . Not on file  Social Needs  . Financial resource strain: Not on file  . Food insecurity    Worry: Not on file    Inability: Not on file  . Transportation needs    Medical: Not on file    Non-medical: Not on file  Tobacco Use  . Smoking status: Current Every Day Smoker    Packs/day: 0.50    Years: 30.00    Pack years: 15.00    Types: Cigarettes  . Smokeless tobacco: Never Used  Substance and Sexual Activity  . Alcohol use: Yes    Comment: occ beer  . Drug use: No  . Sexual activity: Never  Lifestyle  . Physical activity    Days per week: Not on file    Minutes per session: Not on file  . Stress: Not on file  Relationships  . Social Herbalist on phone: Not on file    Gets together: Not on file    Attends religious service: Not on file    Active member of club or organization: Not on file    Attends meetings of clubs or organizations: Not on file    Relationship status: Not on file  Other Topics Concern  . Not on file  Social History Narrative  . Not on file    Review of Systems: Gen: Denies fever, chills, anorexia. Denies fatigue, weakness, weight loss.  CV: Denies chest pain, palpitations, syncope, peripheral edema, and claudication. Resp: +DOE GI: Denies vomiting blood, jaundice, and fecal  incontinence.   Denies dysphagia or odynophagia. Derm: Denies rash, itching, dry skin Psych: Denies  depression, anxiety, memory loss, confusion. No homicidal or suicidal ideation.  Heme: Denies bruising, bleeding, and enlarged lymph nodes.  Physical Exam: BP (!) 144/88   Pulse 89   Temp (!) 97 F (36.1 C) (Oral)   Ht 5\' 10"  (1.778 m)   Wt (!) 300 lb 6.4 oz (136.3 kg)   BMI 43.10 kg/m  General:   Alert and oriented. No distress noted. Pleasant and cooperative.  Head:  Normocephalic and atraumatic. Eyes:  Conjuctiva clear without scleral icterus. Lungs: Coarse, wheezing bilaterally Cardiac: S1 S2 present without murmur  Abdomen:  +BS, obese but soft, non-tender. Unable to assess HSM due to large AP diameter.  Msk:  Symmetrical without gross deformities. Normal posture. Extremities:  Without edema. Neurologic:  Alert and  oriented x4 Psych:  Alert and cooperative. Normal mood and affect.

## 2019-07-04 LAB — COMPREHENSIVE METABOLIC PANEL
ALT: 44 IU/L (ref 0–44)
AST: 33 IU/L (ref 0–40)
Albumin/Globulin Ratio: 1.6 (ref 1.2–2.2)
Albumin: 4.2 g/dL (ref 3.8–4.9)
Alkaline Phosphatase: 97 IU/L (ref 39–117)
BUN/Creatinine Ratio: 14 (ref 9–20)
BUN: 12 mg/dL (ref 6–24)
Bilirubin Total: 0.3 mg/dL (ref 0.0–1.2)
CO2: 22 mmol/L (ref 20–29)
Calcium: 9.4 mg/dL (ref 8.7–10.2)
Chloride: 97 mmol/L (ref 96–106)
Creatinine, Ser: 0.86 mg/dL (ref 0.76–1.27)
GFR calc Af Amer: 112 mL/min/{1.73_m2} (ref >59)
GFR calc non Af Amer: 97 mL/min/{1.73_m2} (ref >59)
Globulin, Total: 2.6 g/dL (ref 1.5–4.5)
Glucose: 315 mg/dL — ABNORMAL HIGH (ref 65–99)
Potassium: 4.1 mmol/L (ref 3.5–5.2)
Sodium: 138 mmol/L (ref 134–144)
Total Protein: 6.8 g/dL (ref 6.0–8.5)

## 2019-07-04 LAB — LIPID PANEL W/O CHOL/HDL RATIO
Cholesterol, Total: 132 mg/dL (ref 100–199)
HDL: 30 mg/dL — ABNORMAL LOW (ref >39)
LDL Chol Calc (NIH): 53 mg/dL (ref 0–99)
Triglycerides: 315 mg/dL — ABNORMAL HIGH (ref 0–149)
VLDL Cholesterol Cal: 49 mg/dL — ABNORMAL HIGH (ref 5–40)

## 2019-07-04 LAB — VITAMIN D 25 HYDROXY (VIT D DEFICIENCY, FRACTURES): Vit D, 25-Hydroxy: 12.1 ng/mL — ABNORMAL LOW (ref 30.0–100.0)

## 2019-07-04 LAB — T4, FREE: Free T4: 1.36 ng/dL (ref 0.82–1.77)

## 2019-07-04 LAB — TSH: TSH: 1.86 u[IU]/mL (ref 0.450–4.500)

## 2019-07-04 LAB — HGB A1C W/O EAG: Hgb A1c MFr Bld: 9.2 % — ABNORMAL HIGH (ref 4.8–5.6)

## 2019-07-06 NOTE — Assessment & Plan Note (Signed)
58 year old male with 8 month history of frequent stools varying between watery and soft, at times mixed. He has no recent antibiotic exposure. No rectal bleeding or abdominal pain. Postprandial symptoms. Last colonoscopy in 2018 with multiple polyps and was due for surveillance in 2021. Doubt infectious, as this has persisted over 8 months. Supportive measures limited with history of glaucoma, so we will avoid Bentyl and Levsin. Discussed lactose-free diet, adding Imodium daily, and proceeding with early interval colonoscopy with Dr. Oneida Alar.  Proceed with colonoscopy with Dr. Oneida Alar in the near future. The risks, benefits, and alternatives have been discussed in detail with the patient. They state understanding and desire to proceed.  Propofol due to polypharmacy Hold ELIQUIS X 48 hours prior  Imodium prn Lactose-free diet Strict glycemic control

## 2019-07-10 DIAGNOSIS — Z0001 Encounter for general adult medical examination with abnormal findings: Secondary | ICD-10-CM | POA: Diagnosis not present

## 2019-07-10 DIAGNOSIS — J9611 Chronic respiratory failure with hypoxia: Secondary | ICD-10-CM | POA: Diagnosis not present

## 2019-07-10 DIAGNOSIS — J449 Chronic obstructive pulmonary disease, unspecified: Secondary | ICD-10-CM | POA: Diagnosis not present

## 2019-07-10 DIAGNOSIS — E1151 Type 2 diabetes mellitus with diabetic peripheral angiopathy without gangrene: Secondary | ICD-10-CM | POA: Diagnosis not present

## 2019-07-10 DIAGNOSIS — I25119 Atherosclerotic heart disease of native coronary artery with unspecified angina pectoris: Secondary | ICD-10-CM | POA: Diagnosis not present

## 2019-07-10 DIAGNOSIS — I10 Essential (primary) hypertension: Secondary | ICD-10-CM | POA: Diagnosis not present

## 2019-07-10 DIAGNOSIS — Z23 Encounter for immunization: Secondary | ICD-10-CM | POA: Diagnosis not present

## 2019-07-10 DIAGNOSIS — Z6841 Body Mass Index (BMI) 40.0 and over, adult: Secondary | ICD-10-CM | POA: Diagnosis not present

## 2019-07-10 DIAGNOSIS — Z1389 Encounter for screening for other disorder: Secondary | ICD-10-CM | POA: Diagnosis not present

## 2019-07-13 ENCOUNTER — Telehealth: Payer: Self-pay

## 2019-07-13 ENCOUNTER — Other Ambulatory Visit: Payer: Self-pay | Admitting: Cardiology

## 2019-07-13 ENCOUNTER — Other Ambulatory Visit: Payer: Self-pay | Admitting: *Deleted

## 2019-07-13 DIAGNOSIS — Z86010 Personal history of colonic polyps: Secondary | ICD-10-CM

## 2019-07-13 DIAGNOSIS — R197 Diarrhea, unspecified: Secondary | ICD-10-CM

## 2019-07-13 MED ORDER — NA SULFATE-K SULFATE-MG SULF 17.5-3.13-1.6 GM/177ML PO SOLN: 1.0000 | Freq: Once | ORAL | 0 refills | Status: AC

## 2019-07-13 NOTE — Telephone Encounter (Signed)
PT left Vm that he was returning call and could be reached at [phone].

## 2019-07-13 NOTE — Telephone Encounter (Signed)
LMOVM for pt 

## 2019-07-13 NOTE — Telephone Encounter (Signed)
Called patient. He is aware of eliquis ok to hold. He is scheduled for procedure with [facility] 11/30 at 1:15pm. Patient aware will mail pre instructions with covid-19 test appt/pre-op appt. Confirmed address. Rx sent to pharmacy. Orders entered.

## 2019-07-13 NOTE — Telephone Encounter (Signed)
Letter faxed to Dr. Dion Body office for pt's upcoming TCS. It is ok to hold Eliquis 48 hours prior to procedure and resume when safe from bleeding standpoint per Dr. Truett Mainland.

## 2019-07-14 ENCOUNTER — Other Ambulatory Visit: Payer: Self-pay | Admitting: Cardiology

## 2019-07-16 ENCOUNTER — Encounter: Payer: Self-pay | Admitting: *Deleted

## 2019-07-16 ENCOUNTER — Ambulatory Visit (INDEPENDENT_AMBULATORY_CARE_PROVIDER_SITE_OTHER): Payer: Medicare HMO | Admitting: Otolaryngology

## 2019-07-17 DIAGNOSIS — E662 Morbid (severe) obesity with alveolar hypoventilation: Secondary | ICD-10-CM | POA: Diagnosis not present

## 2019-07-17 DIAGNOSIS — G4733 Obstructive sleep apnea (adult) (pediatric): Secondary | ICD-10-CM | POA: Diagnosis not present

## 2019-07-17 DIAGNOSIS — G4714 Hypersomnia due to medical condition: Secondary | ICD-10-CM | POA: Diagnosis not present

## 2019-07-17 DIAGNOSIS — Z79899 Other long term (current) drug therapy: Secondary | ICD-10-CM | POA: Diagnosis not present

## 2019-07-24 DIAGNOSIS — G4733 Obstructive sleep apnea (adult) (pediatric): Secondary | ICD-10-CM | POA: Diagnosis not present

## 2019-07-24 DIAGNOSIS — J449 Chronic obstructive pulmonary disease, unspecified: Secondary | ICD-10-CM | POA: Diagnosis not present

## 2019-07-26 ENCOUNTER — Ambulatory Visit (INDEPENDENT_AMBULATORY_CARE_PROVIDER_SITE_OTHER): Payer: Medicare HMO | Admitting: Otolaryngology

## 2019-07-26 DIAGNOSIS — K219 Gastro-esophageal reflux disease without esophagitis: Secondary | ICD-10-CM

## 2019-07-26 DIAGNOSIS — R490 Dysphonia: Secondary | ICD-10-CM | POA: Diagnosis not present

## 2019-07-30 ENCOUNTER — Other Ambulatory Visit: Payer: Self-pay

## 2019-07-30 ENCOUNTER — Encounter: Payer: Self-pay | Admitting: "Endocrinology

## 2019-07-30 ENCOUNTER — Ambulatory Visit (INDEPENDENT_AMBULATORY_CARE_PROVIDER_SITE_OTHER): Payer: Medicare HMO | Admitting: "Endocrinology

## 2019-07-30 DIAGNOSIS — I10 Essential (primary) hypertension: Secondary | ICD-10-CM

## 2019-07-30 DIAGNOSIS — E782 Mixed hyperlipidemia: Secondary | ICD-10-CM

## 2019-07-30 DIAGNOSIS — E1159 Type 2 diabetes mellitus with other circulatory complications: Secondary | ICD-10-CM | POA: Diagnosis not present

## 2019-07-30 MED ORDER — NOVOLIN 70/30 FLEXPEN (70-30) 100 UNIT/ML ~~LOC~~ SUPN: 70.0000 [IU] | PEN_INJECTOR | Freq: Two times a day (BID) | SUBCUTANEOUS | 2 refills | Status: DC

## 2019-07-30 MED ORDER — GLIPIZIDE ER 5 MG PO TB24: 10.0000 mg | ORAL_TABLET | Freq: Every day | ORAL | 3 refills | Status: DC

## 2019-07-30 NOTE — Progress Notes (Signed)
Endocrinology follow-up note   Subjective:    Patient ID: [name], male    DOB: 1961/10/30.  he is being seen in follow-up for management of currently uncontrolled symptomatic type 2 diabetes, hypertension, hyperlipidemia, obesity. PMD:  Sharilyn Sites, MD.   Past Medical History:  Diagnosis Date  . Abnormal myocardial perfusion study 01/01/2011   there a small to moderate sized inferobasal scar  . Barrett's esophagus   . Cataracts, bilateral   . Chronic kidney disease    hx of kidney stones  . Claudication ([place]) 11/16/2011   PV test perform shows normal  . COPD (chronic obstructive pulmonary disease) ([place])   . Depression   . Diabetes ([place])    type 2 diabetes mellitus  . Dysrhythmia   . GERD (gastroesophageal reflux disease)   . Glaucoma   . Hernia of abdominal wall   . HTN (hypertension)   . Hyperlipidemia   . Morbid obesity ([place])   . Myocardial infarction ([place]) 2008,2009,2009  . OSA (obstructive sleep apnea)    on cpap  . PAF (paroxysmal atrial fibrillation) ([place])   . S/P colonoscopy 2009   3-4 mm transverse colon erosions likely secondary to  ASA  . S/P endoscopy Dec 2011   moderate erosive gastritis, Barrett's esophagus 1-2cm  . Sleep apnea   . SOB (shortness of breath) 11/03/2007   2D Echo EF 50%-55%  . Stroke [facility])    Past Surgical History:  Procedure Laterality Date  . BIOPSY N/A 04/29/2015   Procedure: BIOPSY;  Surgeon: Danie Binder, MD;  Location: [facility] ORS;  Service: Endoscopy;  Laterality: N/A;  . BIOPSY  08/01/2018   Procedure: BIOPSY;  Surgeon: Danie Binder, MD;  Location: [facility] ENDO SUITE;  Service: Endoscopy;;  esophageal  . CABG X 4  03/2008  . CARDIAC CATHETERIZATION  2009   stent placement to the left circumflex a 2.25   . CARDIAC CATHETERIZATION  07/08/2010  . COLONOSCOPY WITH PROPOFOL N/A 09/27/2017   normal ileum, twenty 4 to 8 mm polyps in the sigmoid colon, descending colon, splenic flexure, transverse colon, ascending colon,  cecum.  An additional three 2 to 4 mm polyps in the rectum and the descending colon.  Diverticulosis, internal hemorrhoids.  Surgical pathology found the polyps to be one fragment of hyperplastic polyp and 22 fragments of tubular adenoma..  Recommended repeat colonoscop  . CORONARY ARTERY BYPASS GRAFT  2008   4 vessels  . CORONARY STENT PLACEMENT  12/29/12  . CORONARY STENT PLACEMENT  12/2012  . ESOPHAGEAL DILATION N/A 04/29/2015   Procedure: ESOPHAGEAL DILATION 15 mm, 16 mm;  Surgeon: Danie Binder, MD;  Location: [facility];  Service: Endoscopy;  Laterality: N/A;  . ESOPHAGOGASTRODUODENOSCOPY  09/2011   Barrett's esophagus, no dysplasia on biopsies. Distal esophagitis. Status post dilation. Moderate gastritis and duodenitis, but biopsies benign. Next EGD in November 2015 for surveillance of Barrett's esophagus.  . ESOPHAGOGASTRODUODENOSCOPY (EGD) WITH PROPOFOL N/A 04/29/2015   SLF: 1. Barretts esophagus 2. Moderate non-erosive gastritis.   Marland Kitchen ESOPHAGOGASTRODUODENOSCOPY (EGD) WITH PROPOFOL N/A 08/01/2018   Barrett's, repeat in 5 years. Empiric dilatation, mild gastritis  . HERNIA REPAIR     ventral hernia repair  . LEFT HEART CATHETERIZATION WITH CORONARY/GRAFT ANGIOGRAM N/A 12/29/2012   Procedure: LEFT HEART CATHETERIZATION WITH Beatrix Fetters;  Surgeon: Troy Sine, MD;  Location: [facility] CATH LAB;  Service: Cardiovascular;  Laterality: N/A;  . ORIF FIBULA FRACTURE Right 06/03/2015   Procedure: OPEN REDUCTION INTERNAL FIXATION (ORIF) DISTAL  FIBULA  FRACTURE;  Surgeon: Garald Balding, MD;  Location: [place];  Service: Orthopedics;  Laterality: Right;  . PERCUTANEOUS CORONARY STENT INTERVENTION (PCI-S)  12/29/2012   Procedure: PERCUTANEOUS CORONARY STENT INTERVENTION (PCI-S);  Surgeon: Troy Sine, MD;  Location: [facility] CATH LAB;  Service: Cardiovascular;;  . POLYPECTOMY  09/27/2017   Procedure: POLYPECTOMY;  Surgeon: Danie Binder, MD;  [facility];  Service: Endoscopy;;  cecal  polyp, ascending polyps x6, transverse colon polyps x6, splenic flexure polyps x2, descending colon polyps x6, sigmoid  colon polyp x1, rectal polyp x1   . SAVORY DILATION  09/06/2011   Procedure: SAVORY DILATION;  Surgeon: Dorothyann Peng, MD;  Location: [facility];  Service: Endoscopy;  Laterality: N/A;  Dilated with 51mm  . SAVORY DILATION N/A 08/01/2018   Procedure: SAVORY DILATION;  Surgeon: Danie Binder, MD;  [facility];  Service: Endoscopy;  Laterality: N/A;   Social History   Socioeconomic History  . Marital status: Divorced    Spouse name: Not on file  . Number of children: Not on file  . Years of education: Not on file  . Highest education level: Not on file  Occupational History  . Not on file  Social Needs  . Financial resource strain: Not on file  . Food insecurity    Worry: Not on file    Inability: Not on file  . Transportation needs    Medical: Not on file    Non-medical: Not on file  Tobacco Use  . Smoking status: Current Every Day Smoker    Packs/day: 0.50    Years: 30.00    Pack years: 15.00    Types: Cigarettes  . Smokeless tobacco: Never Used  Substance and Sexual Activity  . Alcohol use: Yes    Comment: occ beer  . Drug use: No  . Sexual activity: Never  Lifestyle  . Physical activity    Days per week: Not on file    Minutes per session: Not on file  . Stress: Not on file  Relationships  . Social Herbalist on phone: Not on file    Gets together: Not on file    Attends religious service: Not on file    Active member of club or organization: Not on file    Attends meetings of clubs or organizations: Not on file    Relationship status: Not on file  Other Topics Concern  . Not on file  Social History Narrative  . Not on file   Outpatient Encounter Medications as of 07/30/2019  Medication Sig  . albuterol (PROVENTIL HFA;VENTOLIN HFA) 108 (90 Base) MCG/ACT inhaler Inhale 2 puffs into the lungs every 4 (four) hours as needed  for wheezing or shortness of breath.  Marland Kitchen apixaban (ELIQUIS) 5 MG TABS tablet Take 1 tablet (5 mg total) by mouth 2 (two) times daily.  . diazepam (VALIUM) 5 MG tablet Take 5 mg by mouth 2 (two) times daily.  Marland Kitchen escitalopram (LEXAPRO) 20 MG tablet Take 20 mg daily by mouth.   . fenofibrate micronized (LOFIBRA) 134 MG capsule Take 134 mg by mouth at bedtime.  . Fluticasone-Umeclidin-Vilant (TRELEGY ELLIPTA) 100-62.5-25 MCG/INH AEPB Inhale 1 puff daily into the lungs.   . furosemide (LASIX) 80 MG tablet Take 80 mg by mouth daily.  Marland Kitchen glipiZIDE (GLUCOTROL XL) 5 MG 24 hr tablet Take 2 tablets (10 mg total) by mouth daily with breakfast.  . Insulin Isophane & Regular Human (NOVOLIN  70/30 FLEXPEN) (70-30) 100 UNIT/ML PEN Inject 70 Units into the skin 2 (two) times daily.  . isosorbide mononitrate (IMDUR) 60 MG 24 hr tablet Take 1 tablet (60 mg total) by mouth 3 (three) times daily. Please make an appointment for additional refills (Patient taking differently: Take 60 mg by mouth daily. )  . lisinopril (PRINIVIL,ZESTRIL) 5 MG tablet Take 5 mg by mouth daily.  Marland Kitchen loperamide (IMODIUM A-D) 2 MG tablet Take 1 tablet (2 mg total) by mouth 2 (two) times daily as needed for diarrhea or loose stools.  . meclizine (ANTIVERT) 25 MG tablet Take 25 mg by mouth 2 (two) times daily as needed for dizziness.  . metoprolol tartrate (LOPRESSOR) 100 MG tablet Take 1 tablet (100 mg total) by mouth 2 (two) times daily. OFFICE VISIT NEEDED BEFORE ADDITIONAL REFILLS  . modafinil (PROVIGIL) 100 MG tablet Take 100-200 mg by mouth daily.   . nitroGLYCERIN (NITROLINGUAL) 0.4 MG/SPRAY spray Place 1 spray under the tongue every 5 (five) minutes x 3 doses as needed for chest pain.  . pantoprazole (PROTONIX) 40 MG tablet Take 1 tablet (40 mg total) by mouth 2 (two) times daily before a meal.  . potassium chloride SA (K-DUR,KLOR-CON) 20 MEQ tablet Take 1 tablet (20 mEq total) by mouth 2 (two) times daily.  . rosuvastatin (CRESTOR) 20 MG  tablet Take 1 tablet by mouth daily.   . sitaGLIPtin (JANUVIA) 50 MG tablet Take 1 tablet (50 mg total) by mouth daily.  . [DISCONTINUED] glipiZIDE (GLUCOTROL XL) 5 MG 24 hr tablet Take 1 tablet (5 mg total) by mouth daily with breakfast.  . [DISCONTINUED] Insulin Isophane & Regular Human (NOVOLIN 70/30 FLEXPEN) (70-30) 100 UNIT/ML PEN Inject 60 Units into the skin 2 (two) times daily.   No facility-administered encounter medications on file as of 07/30/2019.     ALLERGIES: Allergies  Allergen Reactions  . Contrast Media [Iodinated Diagnostic Agents] Other (See Comments)    Pt must be premedicated before given contrast media - stops heart  . Iohexol Other (See Comments)     Consult with radiologist before pre meds are given.Desc: PT. STATES HEART STOPPED HAS TO BE PREMED.     VACCINATION STATUS:  There is no immunization history on file for this patient.  Diabetes He presents for his follow-up diabetic visit. He has type 2 diabetes mellitus. Onset time: He was diagnosed at approximate age of 71 years. His disease course has been worsening. There are no hypoglycemic associated symptoms. Pertinent negatives for hypoglycemia include no confusion, headaches, pallor or seizures. Associated symptoms include polydipsia and polyuria. Pertinent negatives for diabetes include no blurred vision, no chest pain, no fatigue, no polyphagia and no weakness. There are no hypoglycemic complications. Symptoms are worsening. Diabetic complications include a CVA and heart disease. Risk factors for coronary artery disease include diabetes mellitus, dyslipidemia, hypertension, male sex, obesity, tobacco exposure, sedentary lifestyle and family history. Current diabetic treatments: He was supposed to be on basal insulin, unfortunately he ran out and could not afford refills and he did not call clinic for alternatives. He is compliant with treatment none of the time. He is following a generally unhealthy diet. When  asked about meal planning, he reported none. He has not had a previous visit with a dietitian. He never participates in exercise. His home blood glucose trend is increasing steadily. His breakfast blood glucose range is generally >200 mg/dl. His bedtime blood glucose range is generally >200 mg/dl. His overall blood glucose range is >200 mg/dl.  An ACE inhibitor/angiotensin II receptor blocker is being taken. He does not see a podiatrist.Eye exam is not current.  Hyperlipidemia This is a chronic problem. The current episode started more than 1 year ago. The problem is uncontrolled. Recent lipid tests were reviewed and are high. Exacerbating diseases include diabetes and obesity. Pertinent negatives include no chest pain, myalgias or shortness of breath. Current antihyperlipidemic treatment includes statins. Compliance problems include medication cost and psychosocial issues.  Risk factors for coronary artery disease include dyslipidemia, diabetes mellitus, hypertension, male sex, obesity, family history and a sedentary lifestyle.  Hypertension This is a chronic problem. The current episode started more than 1 year ago. The problem is uncontrolled. Pertinent negatives include no blurred vision, chest pain, headaches, neck pain, palpitations or shortness of breath. Risk factors for coronary artery disease include diabetes mellitus, dyslipidemia, male gender, obesity, family history, sedentary lifestyle and smoking/tobacco exposure. Past treatments include ACE inhibitors. Hypertensive end-organ damage includes CVA.   Review of systems: Limited as above.  Objective:    There were no vitals taken for this visit.  Wt Readings from Last 3 Encounters:  07/03/19 (!) 300 lb 6.4 oz (136.3 kg)  04/10/19 299 lb (135.6 kg)  01/30/19 293 lb (132.9 kg)     CMP ( most recent) CMP     Component Value Date/Time   NA 138 07/03/2019 1324   K 4.1 07/03/2019 1324   CL 97 07/03/2019 1324   CO2 22 07/03/2019 1324    GLUCOSE 315 (H) 07/03/2019 1324   GLUCOSE 209 (H) 01/30/2019 1320   BUN 12 07/03/2019 1324   CREATININE 0.86 07/03/2019 1324   CALCIUM 9.4 07/03/2019 1324   PROT 6.8 07/03/2019 1324   ALBUMIN 4.2 07/03/2019 1324   AST 33 07/03/2019 1324   ALT 44 07/03/2019 1324   ALKPHOS 97 07/03/2019 1324   BILITOT 0.3 07/03/2019 1324   GFRNONAA 97 07/03/2019 1324   GFRAA 112 07/03/2019 1324   Diabetic Labs (most recent): Lab Results  Component Value Date   HGBA1C 9.2 (H) 07/03/2019   HGBA1C 7.3 (H) 04/02/2019   HGBA1C 8.2 (H) 12/01/2018     Lipid Panel ( most recent) Lipid Panel     Component Value Date/Time   CHOL 132 07/03/2019 1324   TRIG 315 (H) 07/03/2019 1324   HDL 30 (L) 07/03/2019 1324   CHOLHDL 6.1 04/28/2014 0121   VLDL 61 (H) 04/28/2014 0121   LDLCALC Comment 11/18/2017 0823     Assessment & Plan:   1. DM type 2 causing vascular disease ([place])  - Patient has currently uncontrolled symptomatic type 2 DM since  58 years of age.  -He reports significantly above target glycemic profile both fasting and postprandial.  His previsit labs show A1c of 9.2% increasing from 7.3%.  -his diabetes is complicated by coronary artery disease, CVA, obesity/sedentary life, chronic heavy smoking and [name] remains at extremely high risk  for more acute and chronic complications which include CAD, CVA, CKD, retinopathy, and neuropathy. These are all discussed in detail with the patient.  - I have counseled him on diet management and weight loss, by adopting a carbohydrate restricted/protein rich diet.  - he  admits there is a room for improvement in his diet and drink choices. -  Suggestion is made for him to avoid simple carbohydrates  from his diet including Cakes, Sweet Desserts / Pastries, Ice Cream, Soda (diet and regular), Sweet Tea, Candies, Chips, Cookies, Sweet Pastries,  Store Bought Juices, Alcohol  in Excess of  1-2 drinks a day, Artificial Sweeteners, Coffee Creamer, and  "Sugar-free" Products. This will help patient to have stable blood glucose profile and potentially avoid unintended weight gain.   - I encouraged him to switch to  unprocessed or minimally processed complex starch and increased protein intake (animal or plant source), fruits, and vegetables.  - he is advised to stick to a routine mealtimes to eat 3 meals  a day and avoid unnecessary snacks ( to snack only to correct hypoglycemia).   - I have approached him with the following individualized plan to manage diabetes and patient agrees:   - He has significant cognitive deficit, for simplicity reasons he will be kept on premixed insulin.  - Besides, he could not afford the co-pays for insulin analogs.    -He is advised to increase his Novolin 70/30 to  70 units with breakfast and 70 units with supper  for pre-meal glucose readings above 90 mg/dL.   -He is advised to continue Januvia 50 mg p.o. daily with breakfast.  -He has benefited from glipizide treatment.  He is advised to increase his glipizide to 10 mg XL p.o. daily at breakfast.    -He does not tolerate metformin due to GI side effects.   -Patient is encouraged to call clinic for blood glucose levels less than 70 or above 300 mg /dl.  - Patient specific target  A1c;  LDL, HDL, Triglycerides, and  Waist Circumference were discussed in detail.  2) BP/HTN: he is advised to home monitor blood pressure and report if > 140/90 on 2 separate readings.  He continues to smoke heavily, he is counseled extensively against smoking.  He is advised to continue his current blood pressure medications consistently including lisinopril 5 mg p.o. daily.   3) Lipids/HPL: His  lipid panel is not controlled  with severe hypertriglyceridemia   of  469.  He is advised to continue Trilipix 135 mg p.o. nightly.    4)  Weight/Diet: CDE Consult has been  initiated , exercise, and detailed carbohydrates information provided.  5) Chronic Care/Health  Maintenance:  -he  is on ACEI/ARB and Statin medications and  is encouraged to continue to follow up with Ophthalmology, Dentist,  Podiatrist at least yearly or according to recommendations, and advised to  quit smoking. I have recommended yearly flu vaccine and pneumonia vaccination at least every 5 years; moderate intensity exercise for up to 150 minutes weekly; and  sleep for at least 7 hours a day. -He is consulted for smoking cessation.  - I advised patient to maintain close follow up with Sharilyn Sites, MD for primary care needs.  - Patient Care Time Today:  25 min, of which >50% was spent in  counseling and the rest reviewing his  current and  previous labs/studies, previous treatments, his blood glucose readings, and medications' doses and developing a plan for long-term care based on the latest recommendations for standards of care.   [name] participated in the discussions, expressed understanding, and voiced agreement with the above plans.  All questions were answered to his satisfaction. he is encouraged to contact clinic should he have any questions or concerns prior to his return visit.  Follow up plan: - Return in about 3 months (around 10/29/2019) for Bring Meter and Logs- A1c in Office.   Glade Lloyd, MD Phone: [phone]  Fax: [phone]   07/30/2019, 1:10 PM   This note was partially dictated with voice recognition software. Similar sounding words  can be transcribed inadequately or may not  be corrected upon review.

## 2019-07-31 ENCOUNTER — Ambulatory Visit: Payer: Medicare HMO | Admitting: "Endocrinology

## 2019-08-06 ENCOUNTER — Telehealth: Payer: Self-pay | Admitting: "Endocrinology

## 2019-08-06 DIAGNOSIS — R69 Illness, unspecified: Secondary | ICD-10-CM | POA: Diagnosis not present

## 2019-08-06 MED ORDER — BLOOD GLUCOSE METER KIT: PACK | 5 refills | Status: AC

## 2019-08-06 NOTE — Telephone Encounter (Signed)
[employer] called and said that the patient needs a RX called in for one touch meter and supplies, whatever he is using now is not covered. [facility] [place]

## 2019-08-06 NOTE — Telephone Encounter (Signed)
Rx sent to [facility]

## 2019-08-07 DIAGNOSIS — R69 Illness, unspecified: Secondary | ICD-10-CM | POA: Diagnosis not present

## 2019-08-13 ENCOUNTER — Ambulatory Visit: Payer: Medicare HMO | Admitting: "Endocrinology

## 2019-08-28 DIAGNOSIS — J449 Chronic obstructive pulmonary disease, unspecified: Secondary | ICD-10-CM | POA: Diagnosis not present

## 2019-08-28 DIAGNOSIS — G4733 Obstructive sleep apnea (adult) (pediatric): Secondary | ICD-10-CM | POA: Diagnosis not present

## 2019-09-13 ENCOUNTER — Other Ambulatory Visit: Payer: Self-pay | Admitting: "Endocrinology

## 2019-09-13 DIAGNOSIS — E559 Vitamin D deficiency, unspecified: Secondary | ICD-10-CM

## 2019-09-13 DIAGNOSIS — I10 Essential (primary) hypertension: Secondary | ICD-10-CM

## 2019-09-13 DIAGNOSIS — E1159 Type 2 diabetes mellitus with other circulatory complications: Secondary | ICD-10-CM

## 2019-09-26 ENCOUNTER — Encounter (HOSPITAL_COMMUNITY): Payer: Self-pay | Admitting: Anesthesiology

## 2019-09-26 ENCOUNTER — Other Ambulatory Visit: Payer: Self-pay

## 2019-09-26 ENCOUNTER — Encounter (HOSPITAL_COMMUNITY)
Admission: RE | Admit: 2019-09-26 | Discharge: 2019-09-26 | Disposition: A | Discharge status: Home / Self Care | Payer: Medicare HMO | Source: Ambulatory Visit | Attending: Gastroenterology | Admitting: Gastroenterology

## 2019-09-26 ENCOUNTER — Telehealth: Payer: Self-pay | Admitting: *Deleted

## 2019-09-26 NOTE — Telephone Encounter (Signed)
Called pt. Aware procedure time has been moved for procedure on 11/30 to 9:30am, arrival 8:00am. Aware he will need to drink 2nd half of prep at 4:30am, npo 6:30am. He voiced understanding. Endo aware

## 2019-09-28 ENCOUNTER — Other Ambulatory Visit (HOSPITAL_COMMUNITY)
Admission: RE | Admit: 2019-09-28 | Discharge: 2019-09-28 | Disposition: A | Discharge status: Home / Self Care | Payer: Medicare HMO | Source: Ambulatory Visit | Attending: Gastroenterology | Admitting: Gastroenterology

## 2019-09-28 DIAGNOSIS — J449 Chronic obstructive pulmonary disease, unspecified: Secondary | ICD-10-CM | POA: Diagnosis not present

## 2019-09-28 DIAGNOSIS — G4733 Obstructive sleep apnea (adult) (pediatric): Secondary | ICD-10-CM | POA: Diagnosis not present

## 2019-09-28 DIAGNOSIS — Z01812 Encounter for preprocedural laboratory examination: Secondary | ICD-10-CM | POA: Diagnosis not present

## 2019-09-28 DIAGNOSIS — Z20828 Contact with and (suspected) exposure to other viral communicable diseases: Secondary | ICD-10-CM | POA: Insufficient documentation

## 2019-09-28 LAB — SARS CORONAVIRUS 2 (TAT 6-24 HRS): SARS Coronavirus 2: NEGATIVE

## 2019-10-01 ENCOUNTER — Encounter (HOSPITAL_COMMUNITY): Payer: Self-pay

## 2019-10-01 ENCOUNTER — Ambulatory Visit (HOSPITAL_COMMUNITY)
Admission: RE | Admit: 2019-10-01 | Discharge: 2019-10-01 | Disposition: A | Discharge status: Home / Self Care | Payer: Medicare HMO | Attending: Gastroenterology | Admitting: Gastroenterology

## 2019-10-01 ENCOUNTER — Other Ambulatory Visit: Payer: Self-pay

## 2019-10-01 ENCOUNTER — Encounter (HOSPITAL_COMMUNITY): Payer: Self-pay | Admitting: Anesthesiology

## 2019-10-01 ENCOUNTER — Ambulatory Visit (HOSPITAL_COMMUNITY): Admit: 2019-10-01 | Payer: Medicare HMO | Admitting: Gastroenterology

## 2019-10-01 ENCOUNTER — Encounter (HOSPITAL_COMMUNITY)
Admission: RE | Disposition: A | Discharge status: Home / Self Care | Payer: Self-pay | Source: Home / Self Care | Attending: Gastroenterology

## 2019-10-01 DIAGNOSIS — R030 Elevated blood-pressure reading, without diagnosis of hypertension: Secondary | ICD-10-CM | POA: Diagnosis not present

## 2019-10-01 DIAGNOSIS — E119 Type 2 diabetes mellitus without complications: Secondary | ICD-10-CM | POA: Insufficient documentation

## 2019-10-01 DIAGNOSIS — R0602 Shortness of breath: Secondary | ICD-10-CM | POA: Insufficient documentation

## 2019-10-01 DIAGNOSIS — Z5309 Procedure and treatment not carried out because of other contraindication: Secondary | ICD-10-CM | POA: Insufficient documentation

## 2019-10-01 LAB — GLUCOSE, CAPILLARY: Glucose-Capillary: 300 mg/dL — ABNORMAL HIGH (ref 70–99)

## 2019-10-01 SURGERY — COLONOSCOPY WITH PROPOFOL
Anesthesia: Monitor Anesthesia Care

## 2019-10-01 NOTE — Progress Notes (Signed)
Dressed self. D/C instructions given to pt. Voiced understanding. Informed pt that Dr Oneida Alar office would be calling him to reschedule. D/C to home in good condition.

## 2019-10-01 NOTE — Progress Notes (Addendum)
Pt states that he has not taken any of his med since Friday (09/28/2019) evening.  Blood glucose 300. VS 149/95, 110, 24, 94%. resp labored. O2 started with nasal cannula at 3 l/m. Dr Kathrin Ruddy notified. Dr Kathrin Ruddy in to talk with pt.

## 2019-10-01 NOTE — H&P (Signed)
TCS CANCELED DUE TO ELEVATED BP/HR, SOB, AND BG > 300.Marland Kitchen

## 2019-10-01 NOTE — Discharge Instructions (Signed)
RE-START ELIQUIS TODAY. TAKE ALL MEDS STARTING TODAY AS PRESCRIBED.   EAT TO LIVE AND THINK OF FOOD AS MEDICINE. 75% OF YOUR PLATE SHOULD BE FRUITS/VEGGIES.  To have more energy, and to lose weight:      1. CONTINUE YOUR WEIGHT LOSS EFFORTS. I RECOMMEND YOU FOLLOW MY INSTRUCTIONS BELOW. CONSIDER AS WELL FOLLOWING RECOMMENDATIONS BY DR. MARK HYMAN, "10-DAY DETOX DIET".    2. If you must eat bread, EAT EZEKIEL BREAD. IT IS IN THE FROZEN SECTION OF THE GROCERY STORE.    3. DRINK WATER WITH FRUIT OR CUCUMBER ADDED. YOUR URINE SHOULD BE LIGHT YELLOW. AVOID SODA, GATORADE, ENERGY DRINKS, OR DIET SODA.     4. AVOID HIGH FRUCTOSE CORN SYRUP AND CAFFEINE.     5. DO NOT chew SUGAR FREE GUM OR USE ARTIFICIAL SWEETENERS. IF NEEDED USE STEVIA AS A SWEETENER.    6. DO NOT EAT ENRICHED WHEAT FLOUR, PASTA, RICE, OR CEREAL.    7. ONLY EAT WILD CAUGHT SEAFOOD, GRASS FED BEEF OR CHICKEN, PORK FROM PASTURE RAISE PIGS, OR EGGS FROM PASTURE RAISED CHICKENS.    8. PRACTICE CHAIR YOGA FOR 15-30 MINS 3 OR 4 TIMES A WEEK AND PROGRESS TO HATHA YOGA OVER NEXT 6 MOS.    9. START TAKING A MULTIVITAMIN, VITAMIN B12, AND VITAMIN D3 2000 IU DAILY.   ADDITIONAL SUPPLEMENTS TO DECREASE CRAVING AND SUPPRESS YOUR APPETITE:    1. CINNAMON 500 MG EVERY AM PRIOR TO FIRST MEAL.   **STABILIZES BLOOD GLUCOSE/REDUCES CRAVINGS**    2. CHROMIUM 400-500 MG WITH MEALS TWICE DAILY.    **FAT BURNER**    3. GREEN TEA EXTRACT ONE DAILY.   **FAT BURNER/SUPPRESSES YOUR APPETITE**    4. ALPHA LIPOIC ACID TWICE DAILY.   **NATURAL ANTI-INFLAMMATORY SUPPLEMENT THAT IS AN ALTERNATIVE TO IBUPROFEN OR NAPROXEN**  WE WILL CONTACT YOU TO RESCHEDULE.

## 2019-10-01 NOTE — Progress Notes (Signed)
Dr Oneida Alar in to talk with pt. Informed pt his procedure was canceled because of elevated blood glucose and vitsl signs. Also with not taking his meds since Friday night. Questions answered. Pt voiced understanding.

## 2019-10-02 NOTE — Progress Notes (Signed)
CC'D TO PCP °

## 2019-10-07 NOTE — Progress Notes (Signed)
REVIEWED-NO ADDITIONAL RECOMMENDATIONS. 

## 2019-10-08 ENCOUNTER — Other Ambulatory Visit: Payer: Self-pay | Admitting: "Endocrinology

## 2019-10-11 ENCOUNTER — Other Ambulatory Visit: Payer: Self-pay | Admitting: "Endocrinology

## 2019-10-11 DIAGNOSIS — E119 Type 2 diabetes mellitus without complications: Secondary | ICD-10-CM | POA: Diagnosis not present

## 2019-10-11 DIAGNOSIS — E559 Vitamin D deficiency, unspecified: Secondary | ICD-10-CM | POA: Diagnosis not present

## 2019-10-12 LAB — COMPREHENSIVE METABOLIC PANEL
ALT: 41 IU/L (ref 0–44)
AST: 28 IU/L (ref 0–40)
Albumin/Globulin Ratio: 1.7 (ref 1.2–2.2)
Albumin: 4.1 g/dL (ref 3.8–4.9)
Alkaline Phosphatase: 96 IU/L (ref 39–117)
BUN/Creatinine Ratio: 12 (ref 9–20)
BUN: 11 mg/dL (ref 6–24)
Bilirubin Total: 0.3 mg/dL (ref 0.0–1.2)
CO2: 25 mmol/L (ref 20–29)
Calcium: 9.8 mg/dL (ref 8.7–10.2)
Chloride: 101 mmol/L (ref 96–106)
Creatinine, Ser: 0.89 mg/dL (ref 0.76–1.27)
GFR calc Af Amer: 110 mL/min/{1.73_m2} (ref >59)
GFR calc non Af Amer: 96 mL/min/{1.73_m2} (ref >59)
Globulin, Total: 2.4 g/dL (ref 1.5–4.5)
Glucose: 290 mg/dL — ABNORMAL HIGH (ref 65–99)
Potassium: 4.4 mmol/L (ref 3.5–5.2)
Sodium: 139 mmol/L (ref 134–144)
Total Protein: 6.5 g/dL (ref 6.0–8.5)

## 2019-10-12 LAB — VITAMIN D 25 HYDROXY (VIT D DEFICIENCY, FRACTURES): Vit D, 25-Hydroxy: 9.7 ng/mL — ABNORMAL LOW (ref 30.0–100.0)

## 2019-10-12 LAB — TSH: TSH: 1.6 u[IU]/mL (ref 0.450–4.500)

## 2019-10-18 DIAGNOSIS — R69 Illness, unspecified: Secondary | ICD-10-CM | POA: Diagnosis not present

## 2019-10-23 ENCOUNTER — Ambulatory Visit: Payer: Medicare HMO | Admitting: "Endocrinology

## 2019-10-28 DIAGNOSIS — J449 Chronic obstructive pulmonary disease, unspecified: Secondary | ICD-10-CM | POA: Diagnosis not present

## 2019-10-28 DIAGNOSIS — G4733 Obstructive sleep apnea (adult) (pediatric): Secondary | ICD-10-CM | POA: Diagnosis not present

## 2019-11-01 ENCOUNTER — Ambulatory Visit: Payer: Medicare HMO | Admitting: "Endocrinology

## 2019-11-02 HISTORY — PX: CARDIAC CATHETERIZATION: SHX172

## 2019-11-07 ENCOUNTER — Other Ambulatory Visit: Payer: Self-pay | Admitting: "Endocrinology

## 2019-11-07 ENCOUNTER — Telehealth: Payer: Self-pay | Admitting: Gastroenterology

## 2019-11-07 DIAGNOSIS — E1151 Type 2 diabetes mellitus with diabetic peripheral angiopathy without gangrene: Secondary | ICD-10-CM | POA: Diagnosis not present

## 2019-11-07 DIAGNOSIS — E1159 Type 2 diabetes mellitus with other circulatory complications: Secondary | ICD-10-CM | POA: Diagnosis not present

## 2019-11-07 NOTE — Telephone Encounter (Signed)
Pt said [employer] told him to call for [place] to send them a new Rx of his pantoprazole. [phone]

## 2019-11-08 ENCOUNTER — Telehealth: Payer: Self-pay | Admitting: Gastroenterology

## 2019-11-08 ENCOUNTER — Other Ambulatory Visit: Payer: Self-pay | Admitting: Gastroenterology

## 2019-11-08 LAB — HGB A1C W/O EAG: Hgb A1c MFr Bld: 8.5 % — ABNORMAL HIGH (ref 4.8–5.6)

## 2019-11-08 LAB — T4, FREE: Free T4: 1.25 ng/dL (ref 0.82–1.77)

## 2019-11-08 MED ORDER — PANTOPRAZOLE SODIUM 40 MG PO TBEC: 40.0000 mg | DELAYED_RELEASE_TABLET | Freq: Two times a day (BID) | ORAL | 5 refills | Status: DC

## 2019-11-08 NOTE — Telephone Encounter (Signed)
Pt said [employer] told him to call for [place] to send them a new Rx of his pantoprazole. [phone]

## 2019-11-08 NOTE — Telephone Encounter (Signed)
Routing message to [facility] Refill box. Pt takes Pantoprazole 40 mg bid and is requesting a refill.

## 2019-11-08 NOTE — Telephone Encounter (Signed)
Pt notified that RX was sent.

## 2019-11-08 NOTE — Telephone Encounter (Signed)
Rx sent 

## 2019-11-13 ENCOUNTER — Encounter: Payer: Self-pay | Admitting: "Endocrinology

## 2019-11-13 ENCOUNTER — Ambulatory Visit (INDEPENDENT_AMBULATORY_CARE_PROVIDER_SITE_OTHER): Payer: Medicare HMO | Admitting: "Endocrinology

## 2019-11-13 DIAGNOSIS — E559 Vitamin D deficiency, unspecified: Secondary | ICD-10-CM | POA: Diagnosis not present

## 2019-11-13 DIAGNOSIS — I10 Essential (primary) hypertension: Secondary | ICD-10-CM

## 2019-11-13 DIAGNOSIS — F17200 Nicotine dependence, unspecified, uncomplicated: Secondary | ICD-10-CM

## 2019-11-13 DIAGNOSIS — E782 Mixed hyperlipidemia: Secondary | ICD-10-CM | POA: Diagnosis not present

## 2019-11-13 DIAGNOSIS — E1159 Type 2 diabetes mellitus with other circulatory complications: Secondary | ICD-10-CM

## 2019-11-13 DIAGNOSIS — R69 Illness, unspecified: Secondary | ICD-10-CM | POA: Diagnosis not present

## 2019-11-13 MED ORDER — NOVOLIN 70/30 FLEXPEN (70-30) 100 UNIT/ML ~~LOC~~ SUPN: 80.0000 [IU] | PEN_INJECTOR | Freq: Two times a day (BID) | SUBCUTANEOUS | 2 refills | Status: DC

## 2019-11-13 NOTE — Progress Notes (Signed)
11/13/2019                                                    Endocrinology Telehealth Visit Follow up Note -During COVID -19 Pandemic  This visit type was conducted due to national recommendations for restrictions regarding the COVID-19 Pandemic  in an effort to limit this patient's exposure and mitigate transmission of the corona virus.  Due to his co-morbid illnesses, [name] is at  moderate to high risk for complications without adequate follow up.  This format is felt to be most appropriate for him at this time.  I connected with this patient on 11/13/2019   by telephone and verified that I am speaking with the correct person using two identifiers. [name], [birth year]. he has verbally consented to this visit. All issues noted in this document were discussed and addressed. The format was not optimal for physical exam.     Subjective:    Patient ID: [name], male    DOB: 25-Dec-1953.  he is being engaged in telehealth via telephone  in follow-up for management of currently uncontrolled symptomatic type 2 diabetes, hypertension, hyperlipidemia, obesity. PMD:  Sharilyn Sites, MD.   Past Medical History:  Diagnosis Date  . Abnormal myocardial perfusion study 01/01/2011   there a small to moderate sized inferobasal scar  . Barrett's esophagus   . Cataracts, bilateral   . Chronic kidney disease    hx of kidney stones  . Claudication ([place]) 11/16/2011   PV test perform shows normal  . COPD (chronic obstructive pulmonary disease) ([place])   . Depression   . Diabetes ([place])    type 2 diabetes mellitus  . Dysrhythmia   . GERD (gastroesophageal reflux disease)   . Glaucoma   . Hernia of abdominal wall   . HTN (hypertension)   . Hyperlipidemia   . Morbid obesity ([place])   . Myocardial infarction ([place]) 2008,2009,2009  . OSA (obstructive sleep apnea)    on cpap  . PAF (paroxysmal atrial fibrillation) ([place])   . S/P colonoscopy 2009   3-4 mm transverse colon  erosions likely secondary to  ASA  . S/P endoscopy Dec 2011   moderate erosive gastritis, Barrett's esophagus 1-2cm  . Sleep apnea   . SOB (shortness of breath) 11/03/2007   2D Echo EF 50%-55%  . Stroke [facility])    Past Surgical History:  Procedure Laterality Date  . BIOPSY N/A 04/29/2015   Procedure: BIOPSY;  Surgeon: Danie Binder, MD;  Location: [facility];  Service: Endoscopy;  Laterality: N/A;  . BIOPSY  08/01/2018   Procedure: BIOPSY;  Surgeon: Danie Binder, MD;  Location: [facility];  Service: Endoscopy;;  esophageal  . CABG X 4  03/2008  . CARDIAC CATHETERIZATION  2009   stent placement to the left circumflex a 2.25   . CARDIAC CATHETERIZATION  07/08/2010  . COLONOSCOPY WITH PROPOFOL N/A 09/27/2017   normal ileum, twenty 4 to 8 mm polyps in the sigmoid colon, descending colon, splenic flexure, transverse colon, ascending colon, cecum.  An additional three 2 to 4 mm polyps in the rectum and the descending colon.  Diverticulosis, internal hemorrhoids.  Surgical pathology found the polyps to be one fragment of hyperplastic polyp and 22 fragments of tubular adenoma..  Recommended repeat colonoscop  . CORONARY ARTERY BYPASS  GRAFT  2008   4 vessels  . CORONARY STENT PLACEMENT  12/29/12  . CORONARY STENT PLACEMENT  12/2012  . ESOPHAGEAL DILATION N/A 04/29/2015   Procedure: ESOPHAGEAL DILATION 15 mm, 16 mm;  Surgeon: Danie Binder, MD;  Location: [facility];  Service: Endoscopy;  Laterality: N/A;  . ESOPHAGOGASTRODUODENOSCOPY  09/2011   Barrett's esophagus, no dysplasia on biopsies. Distal esophagitis. Status post dilation. Moderate gastritis and duodenitis, but biopsies benign. Next EGD in November 2015 for surveillance of Barrett's esophagus.  . ESOPHAGOGASTRODUODENOSCOPY (EGD) WITH PROPOFOL N/A 04/29/2015   SLF: 1. Barretts esophagus 2. Moderate non-erosive gastritis.   Marland Kitchen ESOPHAGOGASTRODUODENOSCOPY (EGD) WITH PROPOFOL N/A 08/01/2018   Barrett's, repeat in 5 years. Empiric dilatation, mild  gastritis  . HERNIA REPAIR     ventral hernia repair  . LEFT HEART CATHETERIZATION WITH CORONARY/GRAFT ANGIOGRAM N/A 12/29/2012   Procedure: LEFT HEART CATHETERIZATION WITH Beatrix Fetters;  Surgeon: Troy Sine, MD;  Location: [facility] CATH LAB;  Service: Cardiovascular;  Laterality: N/A;  . ORIF FIBULA FRACTURE Right 06/03/2015   Procedure: OPEN REDUCTION INTERNAL FIXATION (ORIF) DISTAL FIBULA  FRACTURE;  Surgeon: Garald Balding, MD;  Location: [place];  Service: Orthopedics;  Laterality: Right;  . PERCUTANEOUS CORONARY STENT INTERVENTION (PCI-S)  12/29/2012   Procedure: PERCUTANEOUS CORONARY STENT INTERVENTION (PCI-S);  Surgeon: Troy Sine, MD;  Location: [facility] CATH LAB;  Service: Cardiovascular;;  . POLYPECTOMY  09/27/2017   Procedure: POLYPECTOMY;  Surgeon: Danie Binder, MD;  [facility];  Service: Endoscopy;;  cecal polyp, ascending polyps x6, transverse colon polyps x6, splenic flexure polyps x2, descending colon polyps x6, sigmoid  colon polyp x1, rectal polyp x1   . SAVORY DILATION  09/06/2011   Procedure: SAVORY DILATION;  Surgeon: Dorothyann Peng, MD;  Location: [facility];  Service: Endoscopy;  Laterality: N/A;  Dilated with 35m  . SAVORY DILATION N/A 08/01/2018   Procedure: SAVORY DILATION;  Surgeon: FDanie Binder MD;  [facility];  Service: Endoscopy;  Laterality: N/A;   Social History   Socioeconomic History  . Marital status: Divorced    Spouse name: Not on file  . Number of children: Not on file  . Years of education: Not on file  . Highest education level: Not on file  Occupational History  . Not on file  Tobacco Use  . Smoking status: Current Every Day Smoker    Packs/day: 0.50    Years: 30.00    Pack years: 15.00    Types: Cigarettes  . Smokeless tobacco: Never Used  Substance and Sexual Activity  . Alcohol use: Yes    Comment: occ beer  . Drug use: No  . Sexual activity: Never  Other Topics Concern  . Not on file  Social  History Narrative  . Not on file   Social Determinants of Health   Financial Resource Strain:   . Difficulty of Paying Living Expenses: Not on file  Food Insecurity:   . Worried About RCharity fundraiserin the Last Year: Not on file  . Ran Out of Food in the Last Year: Not on file  Transportation Needs:   . Lack of Transportation (Medical): Not on file  . Lack of Transportation (Non-Medical): Not on file  Physical Activity:   . Days of Exercise per Week: Not on file  . Minutes of Exercise per Session: Not on file  Stress:   . Feeling of Stress : Not on file  Social Connections:   .  Frequency of Communication with Friends and Family: Not on file  . Frequency of Social Gatherings with Friends and Family: Not on file  . Attends Religious Services: Not on file  . Active Member of Clubs or Organizations: Not on file  . Attends Archivist Meetings: Not on file  . Marital Status: Not on file   Outpatient Encounter Medications as of 11/13/2019  Medication Sig  . apixaban (ELIQUIS) 5 MG TABS tablet Take 1 tablet (5 mg total) by mouth 2 (two) times daily.  . blood glucose meter kit and supplies Dispense based on patient and insurance preference. Use up to two times daily as directed. (FOR ICD-E11.65)  . diazepam (VALIUM) 5 MG tablet Take 5 mg by mouth 2 (two) times daily.  Marland Kitchen escitalopram (LEXAPRO) 20 MG tablet Take 20 mg daily by mouth.   . fenofibrate micronized (LOFIBRA) 134 MG capsule Take 134 mg by mouth at bedtime.  . Fluticasone-Umeclidin-Vilant (TRELEGY ELLIPTA) 100-62.5-25 MCG/INH AEPB Inhale 1 puff daily into the lungs.   . furosemide (LASIX) 80 MG tablet Take 80 mg by mouth daily.  Marland Kitchen glipiZIDE (GLUCOTROL XL) 5 MG 24 hr tablet Take 2 tablets (10 mg total) by mouth daily with breakfast.  . Insulin Isophane & Regular Human (NOVOLIN 70/30 FLEXPEN) (70-30) 100 UNIT/ML PEN Inject 80 Units into the skin 2 (two) times daily before a meal.  . isosorbide mononitrate (IMDUR) 60 MG  24 hr tablet Take 1 tablet (60 mg total) by mouth 3 (three) times daily. Please make an appointment for additional refills (Patient taking differently: Take 60 mg by mouth 2 (two) times daily. )  . JANUVIA 50 MG tablet Take 1 tablet by mouth once daily  . lisinopril (PRINIVIL,ZESTRIL) 5 MG tablet Take 5 mg by mouth daily.  . meclizine (ANTIVERT) 25 MG tablet Take 25 mg by mouth 2 (two) times daily.   . metoprolol tartrate (LOPRESSOR) 100 MG tablet Take 1 tablet (100 mg total) by mouth 2 (two) times daily. OFFICE VISIT NEEDED BEFORE ADDITIONAL REFILLS  . modafinil (PROVIGIL) 200 MG tablet Take 200 mg by mouth daily.  . nitroGLYCERIN (NITROLINGUAL) 0.4 MG/SPRAY spray Place 1 spray under the tongue every 5 (five) minutes x 3 doses as needed for chest pain.  . pantoprazole (PROTONIX) 40 MG tablet Take 1 tablet (40 mg total) by mouth 2 (two) times daily before a meal.  . potassium chloride SA (K-DUR,KLOR-CON) 20 MEQ tablet Take 1 tablet (20 mEq total) by mouth 2 (two) times daily.  . rosuvastatin (CRESTOR) 20 MG tablet Take 20 mg by mouth daily.   . [DISCONTINUED] Insulin Isophane & Regular Human (NOVOLIN 70/30 FLEXPEN) (70-30) 100 UNIT/ML PEN Inject 70 Units into the skin 2 (two) times daily.   No facility-administered encounter medications on file as of 11/13/2019.    ALLERGIES: Allergies  Allergen Reactions  . Contrast Media [Iodinated Diagnostic Agents] Other (See Comments)    Pt must be premedicated before given contrast media - stops heart  . Iohexol Other (See Comments)     Consult with radiologist before pre meds are given.Desc: PT. STATES HEART STOPPED HAS TO BE PREMED.     VACCINATION STATUS:  There is no immunization history on file for this patient.  Diabetes He presents for his follow-up diabetic visit. He has type 2 diabetes mellitus. Onset time: He was diagnosed at approximate age of 21 years. His disease course has been worsening. There are no hypoglycemic associated symptoms.  Pertinent negatives for hypoglycemia include no  confusion, headaches, pallor or seizures. Associated symptoms include polydipsia and polyuria. Pertinent negatives for diabetes include no blurred vision, no chest pain, no fatigue, no polyphagia and no weakness. There are no hypoglycemic complications. Symptoms are improving. Diabetic complications include a CVA and heart disease. Risk factors for coronary artery disease include diabetes mellitus, dyslipidemia, hypertension, male sex, obesity, tobacco exposure, sedentary lifestyle and family history. Current diabetic treatments: He was supposed to be on basal insulin, unfortunately he ran out and could not afford refills and he did not call clinic for alternatives. He is compliant with treatment none of the time. He is following a generally unhealthy diet. When asked about meal planning, he reported none. He has not had a previous visit with a dietitian. He never participates in exercise. His home blood glucose trend is increasing steadily. His breakfast blood glucose range is generally >200 mg/dl. His bedtime blood glucose range is generally >200 mg/dl. His overall blood glucose range is >200 mg/dl. (Is reporting blood glucose before breakfast clinic 271, 402, 291, 339, 247, 227, 362 Before supper: 421, 357, 311, 232, 260, 240, 304  His previsit labs show A1c of 8.5% improving from 9.2%.) An ACE inhibitor/angiotensin II receptor blocker is being taken. He does not see a podiatrist.Eye exam is not current.  Hyperlipidemia This is a chronic problem. The current episode started more than 1 year ago. The problem is uncontrolled. Recent lipid tests were reviewed and are high. Exacerbating diseases include diabetes and obesity. Pertinent negatives include no chest pain, myalgias or shortness of breath. Current antihyperlipidemic treatment includes statins. Compliance problems include medication cost and psychosocial issues.  Risk factors for coronary artery disease  include dyslipidemia, diabetes mellitus, hypertension, male sex, obesity, family history and a sedentary lifestyle.  Hypertension This is a chronic problem. The current episode started more than 1 year ago. The problem is uncontrolled. Pertinent negatives include no blurred vision, chest pain, headaches, neck pain, palpitations or shortness of breath. Risk factors for coronary artery disease include diabetes mellitus, dyslipidemia, male gender, obesity, family history, sedentary lifestyle and smoking/tobacco exposure. Past treatments include ACE inhibitors. Hypertensive end-organ damage includes CVA.   Review of systems: Limited as above.  Objective:    There were no vitals taken for this visit.  Wt Readings from Last 3 Encounters:  07/03/19 (!) 300 lb 6.4 oz (136.3 kg)  04/10/19 299 lb (135.6 kg)  01/30/19 293 lb (132.9 kg)     CMP ( most recent) CMP     Component Value Date/Time   NA 139 10/11/2019 1141   K 4.4 10/11/2019 1141   CL 101 10/11/2019 1141   CO2 25 10/11/2019 1141   GLUCOSE 290 (H) 10/11/2019 1141   GLUCOSE 209 (H) 01/30/2019 1320   BUN 11 10/11/2019 1141   CREATININE 0.89 10/11/2019 1141   CALCIUM 9.8 10/11/2019 1141   PROT 6.5 10/11/2019 1141   ALBUMIN 4.1 10/11/2019 1141   AST 28 10/11/2019 1141   ALT 41 10/11/2019 1141   ALKPHOS 96 10/11/2019 1141   BILITOT 0.3 10/11/2019 1141   GFRNONAA 96 10/11/2019 1141   GFRAA 110 10/11/2019 1141   Diabetic Labs (most recent): Lab Results  Component Value Date   HGBA1C 8.5 (H) 11/07/2019   HGBA1C 9.2 (H) 07/03/2019   HGBA1C 7.3 (H) 04/02/2019     Lipid Panel ( most recent) Lipid Panel     Component Value Date/Time   CHOL 132 07/03/2019 1324   TRIG 315 (H) 07/03/2019 1324   HDL 30 (  L) 07/03/2019 1324   CHOLHDL 6.1 04/28/2014 0121   VLDL 61 (H) 04/28/2014 0121   LDLCALC 53 07/03/2019 1324     Assessment & Plan:   1. DM type 2 causing vascular disease ([place])  - Patient has currently uncontrolled  symptomatic type 2 DM since  59 years of age.  -He reports slightly improving, however still significantly above target glycemic profile.  See above.  His previsit labs show A1c of 8.5%, improving from 9.2%.    -his diabetes is complicated by coronary artery disease, CVA, obesity/sedentary life, chronic heavy smoking and TEGH FRANEK remains at extremely high risk  for more acute and chronic complications which include CAD, CVA, CKD, retinopathy, and neuropathy. These are all discussed in detail with the patient.  - I have counseled him on diet management and weight loss, by adopting a carbohydrate restricted/protein rich diet.  - he  admits there is a room for improvement in his diet and drink choices. -  Suggestion is made for him to avoid simple carbohydrates  from his diet including Cakes, Sweet Desserts / Pastries, Ice Cream, Soda (diet and regular), Sweet Tea, Candies, Chips, Cookies, Sweet Pastries,  Store Bought Juices, Alcohol in Excess of  1-2 drinks a day, Artificial Sweeteners, Coffee Creamer, and "Sugar-free" Products. This will help patient to have stable blood glucose profile and potentially avoid unintended weight gain.   - I encouraged him to switch to  unprocessed or minimally processed complex starch and increased protein intake (animal or plant source), fruits, and vegetables.  - he is advised to stick to a routine mealtimes to eat 3 meals  a day and avoid unnecessary snacks ( to snack only to correct hypoglycemia).   - I have approached him with the following individualized plan to manage diabetes and patient agrees:   - He has significant cognitive deficit, for simplicity reasons he will be kept on premixed insulin.  - Besides, he could not afford the co-pays for insulin analogs.    -He is advised to increase his Novolin 70/30 to 80 units with breakfast and 80 units with supper, for premeal blood glucose readings above 90 mg per DL.    -He is advised to continue  Januvia 50 mg p.o. daily with breakfast.   -He has benefited from glipizide treatment.  He is advised to continue glipizide 10 mg XL p.o. daily at breakfast.    -He does not tolerate metformin due to GI side effects.   -Patient is encouraged to call clinic for blood glucose levels less than 70 or above 300 mg /dl.  - Patient specific target  A1c;  LDL, HDL, Triglycerides, and  Waist Circumference were discussed in detail.  2) BP/HTN:  he is advised to home monitor blood pressure and report if > 140/90 on 2 separate readings.   He continues to smoke heavily, he is counseled extensively against smoking.  He is advised to continue his current blood pressure medications consistently including lisinopril 5 mg p.o. daily.   3) Lipids/HPL: His  lipid panel is not controlled  with severe hypertriglyceridemia   of 315 improving from 469.  He is advised to continue Trilipix 135 mg p.o. nightly.    4)  Weight/Diet: CDE Consult has been  initiated , exercise, and detailed carbohydrates information provided.  5) Chronic Care/Health Maintenance:  -he  is on ACEI/ARB and Statin medications and  is encouraged to continue to follow up with Ophthalmology, Dentist,  Podiatrist at least yearly or according  to recommendations, and advised to  quit smoking. I have recommended yearly flu vaccine and pneumonia vaccination at least every 5 years; moderate intensity exercise for up to 150 minutes weekly; and  sleep for at least 7 hours a day.   The patient was counseled on the dangers of tobacco use, and was advised to quit.  Reviewed strategies to maximize success, including removing cigarettes and smoking materials from environment.   - I advised patient to maintain close follow up with Sharilyn Sites, MD for primary care needs.  - Time spent on this patient care encounter:  35 min, of which >50% was spent in  counseling and the rest reviewing his  current and  previous labs/studies ( including abstraction from  other facilities),  previous treatments, his blood glucose readings, and medications' doses and developing a plan for long-term care based on the latest recommendations for standards of care; and documenting his care.  [name] participated in the discussions, expressed understanding, and voiced agreement with the above plans.  All questions were answered to his satisfaction. he is encouraged to contact clinic should he have any questions or concerns prior to his return visit.  Follow up plan: - Return in about 3 months (around 02/11/2020) for Bring Meter and Logs- A1c in Office, Include 8 log sheets.   Glade Lloyd, MD Phone: [phone]  Fax: [phone]   11/13/2019, 12:24 PM   This note was partially dictated with voice recognition software. Similar sounding words can be transcribed inadequately or may not  be corrected upon review.

## 2019-11-27 DIAGNOSIS — J449 Chronic obstructive pulmonary disease, unspecified: Secondary | ICD-10-CM | POA: Diagnosis not present

## 2019-11-27 DIAGNOSIS — G4733 Obstructive sleep apnea (adult) (pediatric): Secondary | ICD-10-CM | POA: Diagnosis not present

## 2019-11-29 ENCOUNTER — Other Ambulatory Visit: Payer: Self-pay

## 2019-12-07 DIAGNOSIS — R69 Illness, unspecified: Secondary | ICD-10-CM | POA: Diagnosis not present

## 2019-12-20 ENCOUNTER — Ambulatory Visit: Payer: Medicare HMO | Admitting: Gastroenterology

## 2019-12-28 DIAGNOSIS — G4733 Obstructive sleep apnea (adult) (pediatric): Secondary | ICD-10-CM | POA: Diagnosis not present

## 2019-12-28 DIAGNOSIS — J449 Chronic obstructive pulmonary disease, unspecified: Secondary | ICD-10-CM | POA: Diagnosis not present

## 2020-01-02 ENCOUNTER — Ambulatory Visit: Payer: Medicare HMO | Admitting: Gastroenterology

## 2020-01-07 DIAGNOSIS — E261 Secondary hyperaldosteronism: Secondary | ICD-10-CM | POA: Diagnosis not present

## 2020-01-07 DIAGNOSIS — J439 Emphysema, unspecified: Secondary | ICD-10-CM | POA: Diagnosis not present

## 2020-01-07 DIAGNOSIS — J9611 Chronic respiratory failure with hypoxia: Secondary | ICD-10-CM | POA: Diagnosis not present

## 2020-01-07 DIAGNOSIS — E1151 Type 2 diabetes mellitus with diabetic peripheral angiopathy without gangrene: Secondary | ICD-10-CM | POA: Diagnosis not present

## 2020-01-07 DIAGNOSIS — Z008 Encounter for other general examination: Secondary | ICD-10-CM | POA: Diagnosis not present

## 2020-01-07 DIAGNOSIS — I110 Hypertensive heart disease with heart failure: Secondary | ICD-10-CM | POA: Diagnosis not present

## 2020-01-07 DIAGNOSIS — Z6841 Body Mass Index (BMI) 40.0 and over, adult: Secondary | ICD-10-CM | POA: Diagnosis not present

## 2020-01-07 DIAGNOSIS — I509 Heart failure, unspecified: Secondary | ICD-10-CM | POA: Diagnosis not present

## 2020-01-07 DIAGNOSIS — Z794 Long term (current) use of insulin: Secondary | ICD-10-CM | POA: Diagnosis not present

## 2020-01-07 DIAGNOSIS — I25119 Atherosclerotic heart disease of native coronary artery with unspecified angina pectoris: Secondary | ICD-10-CM | POA: Diagnosis not present

## 2020-01-14 DIAGNOSIS — G4714 Hypersomnia due to medical condition: Secondary | ICD-10-CM | POA: Diagnosis not present

## 2020-01-14 DIAGNOSIS — Z79899 Other long term (current) drug therapy: Secondary | ICD-10-CM | POA: Diagnosis not present

## 2020-01-14 DIAGNOSIS — G4730 Sleep apnea, unspecified: Secondary | ICD-10-CM | POA: Diagnosis not present

## 2020-01-16 ENCOUNTER — Ambulatory Visit: Payer: Medicare HMO | Admitting: Gastroenterology

## 2020-01-16 ENCOUNTER — Encounter: Payer: Self-pay | Admitting: Gastroenterology

## 2020-01-16 ENCOUNTER — Other Ambulatory Visit: Payer: Self-pay

## 2020-01-16 DIAGNOSIS — R1319 Other dysphagia: Secondary | ICD-10-CM

## 2020-01-16 DIAGNOSIS — R198 Other specified symptoms and signs involving the digestive system and abdomen: Secondary | ICD-10-CM | POA: Diagnosis not present

## 2020-01-16 DIAGNOSIS — R1310 Dysphagia, unspecified: Secondary | ICD-10-CM | POA: Diagnosis not present

## 2020-01-16 DIAGNOSIS — K219 Gastro-esophageal reflux disease without esophagitis: Secondary | ICD-10-CM

## 2020-01-16 DIAGNOSIS — D126 Benign neoplasm of colon, unspecified: Secondary | ICD-10-CM | POA: Diagnosis not present

## 2020-01-16 DIAGNOSIS — K2270 Barrett's esophagus without dysplasia: Secondary | ICD-10-CM | POA: Diagnosis not present

## 2020-01-16 NOTE — Patient Instructions (Addendum)
DRINK WATER TO KEEP YOUR URINE LIGHT YELLOW.  CONTINUE YOUR WEIGHT LOSS EFFORTS.   CONTINUE PROTONIX. TAKE 30 MINUTES PRIOR TO MEALS TWICE DAILY.   COMPLETE COLONOSCOPY WITH DR. Gala Romney IN NOV 2021. YOU WILL NEED TO HOLD ELIQUIS 48 HOURS PRIOR TO COLONOSCOPY. TAKE HALF INSULIN THE NIGHT BEFORE. HOLD INSULIN IN AM OF COLONOSCOPY. HOLD GLIPIZIDE AND JANUVIA ON AM OF COLONOSCOPY.     EAT TO LIVE AND THINK OF FOOD AS MEDICINE. 75% OF YOUR PLATE SHOULD BE FRUITS/VEGGIES.  To have more energy, BETTER BLOOD SUGAR CONTROL, and to lose weight:      1. CONSIDER FOLLOWING RECOMMENDATIONS OF DR. MARK HYMAN, "10-DAY DETOX DIET".    2. If you must eat bread, EAT EZEKIEL BREAD. IT IS IN THE FROZEN SECTION OF THE GROCERY STORE.    3. DRINK WATER WITH FRUIT OR CUCUMBER ADDED. YOUR URINE SHOULD BE LIGHT YELLOW. AVOID SODA, GATORADE, ENERGY DRINKS, OR DIET SODA.     4. AVOID HIGH FRUCTOSE CORN SYRUP AND CAFFEINE.     5. DO NOT chew SUGAR FREE GUM OR USE ARTIFICIAL SWEETENERS. IF NEEDED USE STEVIA AS A SWEETENER.    6. DO NOT EAT ENRICHED WHEAT FLOUR, PASTA, RICE, OR CEREAL.   TAKE THESE ADDITIONAL SUPPLEMENTS TO CONTROL YOUR BLOOD SUGAR:    1. CINNAMON 500 MG EVERY AM PRIOR TO FIRST MEAL.   **STABILIZES BLOOD GLUCOSE/REDUCES CRAVINGS**    2. CHROMIUM 400-500 MG WITH MEALS TWICE DAILY.    **FAT BURNER**    3. GREEN TEA EXTRACT ONE DAILY.   **FAT BURNER/SUPPRESSES YOUR APPETITE**    4. ALPHA LIPOIC ACID TWICE DAILY.   **NATURAL ANTI-INFLAMMATORY SUPPLEMENT THAT IS AN ALTERNATIVE TO IBUPROFEN OR NAPROXEN**   FOLLOW UP IN 6 MOS WITH DR. Gala Romney.

## 2020-01-16 NOTE — Assessment & Plan Note (Signed)
RARE BRBPR.   REVIEWED TCS REPORT NOV 2018. NEEDS SURVEILLANCE COLONOSCOPY NOV 2021.

## 2020-01-16 NOTE — Assessment & Plan Note (Signed)
SYMPTOMS FAIRLY WELL CONTROLLED.  CONTINUE TO MONITOR SYMPTOMS. FOLLOW UP IN 6 MOS.  

## 2020-01-16 NOTE — Assessment & Plan Note (Signed)
SURVEILLANCE UP TO DATE.  NEXT EGD IN 5 YRS.

## 2020-01-16 NOTE — Progress Notes (Signed)
Subjective:    Patient ID: [name], male    DOB: 1960/11/15, 59 y.o.   MRN: [id number]   Sharilyn Sites, MD  HPI Not seeing blood in stool now. Bowels move a lot: #2-7. Eats once a day. Eats once a day. Drinks [place]. Dew or water. HAVING CHEST PAIN: 1-2X/MO(BETTER WITH NTG). NAUSEA: HEAVES BUT CAN'T THROW UP AND THAT HAPPENS ONCE A WEEK. NEVER THOUGHT ABOUT GASTRIC SLEEVE. OCCASIONAL DARK STOOL. SEEING ENT AND VALIUM HELPED SWALLOWING. PANTOPRAZOLE IS HELPING HEARTBURN.  PT DENIES FEVER, CHILLS, HEMATEMESIS, vomiting, melena, CHANGE IN BOWEL IN HABITS, constipation, abdominal pain, problems swallowing, OR heartburn or indigestion.  Past Medical History:  Diagnosis Date  . Abnormal myocardial perfusion study 01/01/2011   there a small to moderate sized inferobasal scar  . Barrett's esophagus   . Cataracts, bilateral   . Chronic kidney disease    hx of kidney stones  . Claudication ([place]) 11/16/2011   PV test perform shows normal  . COPD (chronic obstructive pulmonary disease) ([place])   . Depression   . Diabetes ([place])    type 2 diabetes mellitus  . Dysrhythmia   . GERD (gastroesophageal reflux disease)   . Glaucoma   . Hernia of abdominal wall   . HTN (hypertension)   . Hyperlipidemia   . Morbid obesity ([place])   . Myocardial infarction ([place]) 2008,2009,2009  . OSA (obstructive sleep apnea)    on cpap  . PAF (paroxysmal atrial fibrillation) ([place])   . S/P colonoscopy 2009   3-4 mm transverse colon erosions likely secondary to  ASA  . S/P endoscopy Dec 2011   moderate erosive gastritis, Barrett's esophagus 1-2cm  . Sleep apnea   . SOB (shortness of breath) 11/03/2007   2D Echo EF 50%-55%  . Stroke [facility])     Past Surgical History:  Procedure Laterality Date  . BIOPSY N/A 04/29/2015   Procedure: BIOPSY;  Surgeon: Danie Binder, MD;  Location: [facility];  Service: Endoscopy;  Laterality: N/A;  . BIOPSY  08/01/2018   Procedure: BIOPSY;  Surgeon: Danie Binder, MD;   Location: [facility];  Service: Endoscopy;;  esophageal  . CABG X 4  03/2008  . CARDIAC CATHETERIZATION  2009   stent placement to the left circumflex a 2.25   . CARDIAC CATHETERIZATION  07/08/2010  . COLONOSCOPY WITH PROPOFOL N/A 09/27/2017   normal ileum, twenty 4 to 8 mm polyps in the sigmoid colon, descending colon, splenic flexure, transverse colon, ascending colon, cecum.  An additional three 2 to 4 mm polyps in the rectum and the descending colon.  Diverticulosis, internal hemorrhoids.  Surgical pathology found the polyps to be one fragment of hyperplastic polyp and 22 fragments of tubular adenoma..  Recommended repeat colonoscop  . CORONARY ARTERY BYPASS GRAFT  2008   4 vessels  . CORONARY STENT PLACEMENT  12/29/12  . CORONARY STENT PLACEMENT  12/2012  . ESOPHAGEAL DILATION N/A 04/29/2015   Procedure: ESOPHAGEAL DILATION 15 mm, 16 mm;  Surgeon: Danie Binder, MD;  Location: [facility];  Service: Endoscopy;  Laterality: N/A;  . ESOPHAGOGASTRODUODENOSCOPY  09/2011   Barrett's esophagus, no dysplasia on biopsies. Distal esophagitis. Status post dilation. Moderate gastritis and duodenitis, but biopsies benign. Next EGD in November 2015 for surveillance of Barrett's esophagus.  . ESOPHAGOGASTRODUODENOSCOPY (EGD) WITH PROPOFOL N/A 04/29/2015   SLF: 1. Barretts esophagus 2. Moderate non-erosive gastritis.   Marland Kitchen ESOPHAGOGASTRODUODENOSCOPY (EGD) WITH PROPOFOL N/A 08/01/2018   Barrett's, repeat in 5 years. Empiric  dilatation, mild gastritis  . HERNIA REPAIR     ventral hernia repair  . LEFT HEART CATHETERIZATION WITH CORONARY/GRAFT ANGIOGRAM N/A 12/29/2012   Procedure: LEFT HEART CATHETERIZATION WITH Beatrix Fetters;  Surgeon: Troy Sine, MD;  Location[facility] CATH LAB;  Service: Cardiovascular;  Laterality: N/A;  . ORIF FIBULA FRACTURE Right 06/03/2015   Procedure: OPEN REDUCTION INTERNAL FIXATION (ORIF) DISTAL FIBULA  FRACTURE;  Surgeon: Garald Balding, MD;  Location: [place];  Service:  Orthopedics;  Laterality: Right;  . PERCUTANEOUS CORONARY STENT INTERVENTION (PCI-S)  12/29/2012   Procedure: PERCUTANEOUS CORONARY STENT INTERVENTION (PCI-S);  Surgeon: Troy Sine, MD;  Location: [facility] CATH LAB;  Service: Cardiovascular;;  . POLYPECTOMY  09/27/2017   Procedure: POLYPECTOMY;  Surgeon: Danie Binder, MD;  [facility];  Service: Endoscopy;;  cecal polyp, ascending polyps x6, transverse colon polyps x6, splenic flexure polyps x2, descending colon polyps x6, sigmoid  colon polyp x1, rectal polyp x1   . SAVORY DILATION  09/06/2011   Procedure: SAVORY DILATION;  Surgeon: Dorothyann Peng, MD;  Location: [facility];  Service: Endoscopy;  Laterality: N/A;  Dilated with 63m  . SAVORY DILATION N/A 08/01/2018   Procedure: SAVORY DILATION;  Surgeon: FDanie Binder MD;  [facility];  Service: Endoscopy;  Laterality: N/A;   Allergies  Allergen Reactions  . Contrast Media [Iodinated Diagnostic Agents] Other (See Comments)    Pt must be premedicated before given contrast media - stops heart  . Iohexol Other (See Comments)     Consult with radiologist before pre meds are given.Desc: PT. STATES HEART STOPPED HAS TO BE PREMED.    Current Outpatient Medications  Medication Sig    . apixaban (ELIQUIS) 5 MG TABS tablet Take 1 tablet (5 mg total) by mouth 2 (two) times daily.    . blood glucose meter kit and supplies Dispense based on patient and insurance preference. Use up to two times daily as directed. (FOR ICD-E11.65)    . diazepam (VALIUM) 5 MG tablet Take 5 mg by mouth 2 (two) times daily.    .Marland Kitchenescitalopram (LEXAPRO) 20 MG tablet Take 20 mg daily by mouth.     . fenofibrate micronized (LOFIBRA) 134 MG capsule Take 134 mg by mouth at bedtime.    . Fluticasone-Umeclidin-Vilant (TRELEGY ELLIPTA) 100-62.5-25 MCG/INH AEPB Inhale 1 puff daily into the lungs.     . furosemide (LASIX) 80 MG tablet Take 80 mg by mouth daily.    .Marland KitchenglipiZIDE (GLUCOTROL XL) 5 MG 24 hr tablet  Take 2 tablets (10 mg total) by mouth daily with breakfast.    . Insulin Isophane & Regular Human (HUMULIN 70/30 MIX) (70-30) 100 UNIT/ML PEN Inject 80 Units into the skin 2 (two) times daily with a meal. 80 units with breakfast, 80 units with supper when BG above 90    . isosorbide mononitrate (IMDUR) 60 MG 24 hr tablet Take 1 tablet (60 mg total) by mouth 3 (three) times daily. Please make an appointment for additional refills (Patient taking differently: Take 60 mg by mouth 2 (two) times daily. )    . JANUVIA 50 MG tablet Take 1 tablet by mouth once daily    . lisinopril (PRINIVIL,ZESTRIL) 5 MG tablet Take 5 mg by mouth daily.    . meclizine (ANTIVERT) 25 MG tablet Take 25 mg by mouth 2 (two) times daily.     . metoprolol tartrate (LOPRESSOR) 100 MG tablet Take 1 tablet (100 mg total) by mouth  2 (two) times daily. OFFICE VISIT NEEDED BEFORE ADDITIONAL REFILLS    . modafinil (PROVIGIL) 200 MG tablet Take 200 mg by mouth daily.    . nitroGLYCERIN (NITROLINGUAL) 0.4 MG/SPRAY spray Place 1 spray under the tongue every 5 (five) minutes x 3 doses as needed for chest pain.    . pantoprazole (PROTONIX) 40 MG tablet Take 1 tablet (40 mg total) by mouth 2 (two) times daily before a meal.    . potassium chloride SA (K-DUR,KLOR-CON) 20 MEQ tablet Take 1 tablet (20 mEq total) by mouth 2 (two) times daily.    . rosuvastatin (CRESTOR) 20 MG tablet Take 20 mg by mouth daily.      Review of Systems PER HPI OTHERWISE ALL SYSTEMS ARE NEGATIVE.    Objective:   Physical Exam Constitutional:      General: He is not in acute distress.    Appearance: Normal appearance.  HENT:     Mouth/Throat:     Comments: MASK IN PLACE Eyes:     General: No scleral icterus.    Pupils: Pupils are equal, round, and reactive to light.  Cardiovascular:     Rate and Rhythm: Normal rate and regular rhythm.     Pulses: Normal pulses.     Heart sounds: Normal heart sounds.  Pulmonary:     Effort: Pulmonary effort is normal.      Breath sounds: Normal breath sounds.  Abdominal:     General: Bowel sounds are normal.     Palpations: Abdomen is soft.     Tenderness: There is abdominal tenderness.     Comments: MILD RUQ/RLQ TENDERNESS  Musculoskeletal:     Cervical back: Normal range of motion.     Right lower leg: No edema.     Left lower leg: No edema.  Lymphadenopathy:     Cervical: No cervical adenopathy.  Skin:    General: Skin is warm and dry.  Neurological:     Mental Status: He is alert and oriented to person, place, and time.     Comments: NO  NEW FOCAL DEFICITS  Psychiatric:        Mood and Affect: Mood normal.     Comments: NORMAL AFFECT       Assessment & Plan:

## 2020-01-16 NOTE — Assessment & Plan Note (Signed)
SYMPTOMS FAIRLY WELL CONTROLLED AND MOST LIKELY DUE TO MEDS/DIABETES,  CONTINUE TO MONITOR SYMPTOMS. FOLLOW UP IN 6 MOS.

## 2020-01-19 DIAGNOSIS — R69 Illness, unspecified: Secondary | ICD-10-CM | POA: Diagnosis not present

## 2020-01-21 ENCOUNTER — Other Ambulatory Visit: Payer: Self-pay

## 2020-01-21 MED ORDER — GLIPIZIDE ER 5 MG PO TB24: 10.0000 mg | ORAL_TABLET | Freq: Every day | ORAL | 0 refills | Status: DC

## 2020-01-25 DIAGNOSIS — J449 Chronic obstructive pulmonary disease, unspecified: Secondary | ICD-10-CM | POA: Diagnosis not present

## 2020-01-25 DIAGNOSIS — G4733 Obstructive sleep apnea (adult) (pediatric): Secondary | ICD-10-CM | POA: Diagnosis not present

## 2020-02-11 ENCOUNTER — Ambulatory Visit (INDEPENDENT_AMBULATORY_CARE_PROVIDER_SITE_OTHER): Payer: Medicare HMO | Admitting: "Endocrinology

## 2020-02-11 ENCOUNTER — Encounter: Payer: Self-pay | Admitting: "Endocrinology

## 2020-02-11 ENCOUNTER — Other Ambulatory Visit: Payer: Self-pay

## 2020-02-11 VITALS — BP 124/83 | HR 84 | Ht 70.0 in | Wt 302.0 lb

## 2020-02-11 DIAGNOSIS — E559 Vitamin D deficiency, unspecified: Secondary | ICD-10-CM

## 2020-02-11 DIAGNOSIS — E1159 Type 2 diabetes mellitus with other circulatory complications: Secondary | ICD-10-CM

## 2020-02-11 DIAGNOSIS — I10 Essential (primary) hypertension: Secondary | ICD-10-CM

## 2020-02-11 DIAGNOSIS — R69 Illness, unspecified: Secondary | ICD-10-CM | POA: Diagnosis not present

## 2020-02-11 DIAGNOSIS — F17200 Nicotine dependence, unspecified, uncomplicated: Secondary | ICD-10-CM

## 2020-02-11 DIAGNOSIS — E782 Mixed hyperlipidemia: Secondary | ICD-10-CM | POA: Diagnosis not present

## 2020-02-11 DIAGNOSIS — E1151 Type 2 diabetes mellitus with diabetic peripheral angiopathy without gangrene: Secondary | ICD-10-CM | POA: Diagnosis not present

## 2020-02-11 LAB — HEMOGLOBIN A1C: Hemoglobin A1C: 9.7

## 2020-02-11 MED ORDER — VITAMIN D (ERGOCALCIFEROL) 1.25 MG (50000 UNIT) PO CAPS: 50000.0000 [IU] | ORAL_CAPSULE | ORAL | 0 refills | Status: DC

## 2020-02-11 NOTE — Patient Instructions (Signed)

## 2020-02-11 NOTE — Progress Notes (Signed)
02/11/2020   Endocrinology follow-up note    Subjective:    Patient ID: [name], male    DOB: 1961-03-29.  he is being seen in follow-up  for management of currently uncontrolled symptomatic type 2 diabetes, hypertension, hyperlipidemia, obesity. PMD:  Sharilyn Sites, MD.   Past Medical History:  Diagnosis Date  . Abnormal myocardial perfusion study 01/01/2011   there a small to moderate sized inferobasal scar  . Barrett's esophagus   . Cataracts, bilateral   . Chronic kidney disease    hx of kidney stones  . Claudication ([place]) 11/16/2011   PV test perform shows normal  . COPD (chronic obstructive pulmonary disease) ([place])   . Depression   . Diabetes ([place])    type 2 diabetes mellitus  . Dysrhythmia   . GERD (gastroesophageal reflux disease)   . Glaucoma   . Hernia of abdominal wall   . HTN (hypertension)   . Hyperlipidemia   . Morbid obesity ([place])   . Myocardial infarction ([place]) 2008,2009,2009  . OSA (obstructive sleep apnea)    on cpap  . PAF (paroxysmal atrial fibrillation) ([place])   . S/P colonoscopy 2009   3-4 mm transverse colon erosions likely secondary to  ASA  . S/P endoscopy Dec 2011   moderate erosive gastritis, Barrett's esophagus 1-2cm  . Sleep apnea   . SOB (shortness of breath) 11/03/2007   2D Echo EF 50%-55%  . Stroke [facility])    Past Surgical History:  Procedure Laterality Date  . BIOPSY N/A 04/29/2015   Procedure: BIOPSY;  Surgeon: Danie Binder, MD;  Location: [facility];  Service: Endoscopy;  Laterality: N/A;  . BIOPSY  08/01/2018   Procedure: BIOPSY;  Surgeon: Danie Binder, MD;  [facility];  Service: Endoscopy;;  esophageal  . CABG X 4  03/2008  . CARDIAC CATHETERIZATION  2009   stent placement to the left circumflex a 2.25   . CARDIAC CATHETERIZATION  07/08/2010  . COLONOSCOPY WITH PROPOFOL N/A 09/27/2017   normal ileum, twenty 4 to 8 mm polyps in the sigmoid colon, descending colon, splenic flexure, transverse colon,  ascending colon, cecum.  An additional three 2 to 4 mm polyps in the rectum and the descending colon.  Diverticulosis, internal hemorrhoids.  Surgical pathology found the polyps to be one fragment of hyperplastic polyp and 22 fragments of tubular adenoma..  Recommended repeat colonoscop  . CORONARY ARTERY BYPASS GRAFT  2008   4 vessels  . CORONARY STENT PLACEMENT  12/29/12  . CORONARY STENT PLACEMENT  12/2012  . ESOPHAGEAL DILATION N/A 04/29/2015   Procedure: ESOPHAGEAL DILATION 15 mm, 16 mm;  Surgeon: Danie Binder, MD;  Location: [facility];  Service: Endoscopy;  Laterality: N/A;  . ESOPHAGOGASTRODUODENOSCOPY  09/2011   Barrett's esophagus, no dysplasia on biopsies. Distal esophagitis. Status post dilation. Moderate gastritis and duodenitis, but biopsies benign. Next EGD in November 2015 for surveillance of Barrett's esophagus.  . ESOPHAGOGASTRODUODENOSCOPY (EGD) WITH PROPOFOL N/A 04/29/2015   SLF: 1. Barretts esophagus 2. Moderate non-erosive gastritis.   Marland Kitchen ESOPHAGOGASTRODUODENOSCOPY (EGD) WITH PROPOFOL N/A 08/01/2018   Barrett's, repeat in 5 years. Empiric dilatation, mild gastritis  . HERNIA REPAIR     ventral hernia repair  . LEFT HEART CATHETERIZATION WITH CORONARY/GRAFT ANGIOGRAM N/A 12/29/2012   Procedure: LEFT HEART CATHETERIZATION WITH Beatrix Fetters;  Surgeon: Troy Sine, MD;  Location: [facility] CATH LAB;  Service: Cardiovascular;  Laterality: N/A;  . ORIF FIBULA FRACTURE Right 06/03/2015   Procedure: OPEN  REDUCTION INTERNAL FIXATION (ORIF) DISTAL FIBULA  FRACTURE;  Surgeon: Garald Balding, MD;  [facility];  Service: Orthopedics;  Laterality: Right;  . PERCUTANEOUS CORONARY STENT INTERVENTION (PCI-S)  12/29/2012   Procedure: PERCUTANEOUS CORONARY STENT INTERVENTION (PCI-S);  Surgeon: Troy Sine, MD;  Location: [facility] CATH LAB;  Service: Cardiovascular;;  . POLYPECTOMY  09/27/2017   Procedure: POLYPECTOMY;  Surgeon: Danie Binder, MD;  [facility];  Service:  Endoscopy;;  cecal polyp, ascending polyps x6, transverse colon polyps x6, splenic flexure polyps x2, descending colon polyps x6, sigmoid  colon polyp x1, rectal polyp x1   . SAVORY DILATION  09/06/2011   Procedure: SAVORY DILATION;  Surgeon: Dorothyann Peng, MD;  [facility];  Service: Endoscopy;  Laterality: N/A;  Dilated with 51m  . SAVORY DILATION N/A 08/01/2018   Procedure: SAVORY DILATION;  Surgeon: FDanie Binder MD;  [facility];  Service: Endoscopy;  Laterality: N/A;   Social History   Socioeconomic History  . Marital status: Divorced    Spouse name: Not on file  . Number of children: Not on file  . Years of education: Not on file  . Highest education level: Not on file  Occupational History  . Not on file  Tobacco Use  . Smoking status: Current Every Day Smoker    Packs/day: 0.50    Years: 30.00    Pack years: 15.00    Types: Cigarettes  . Smokeless tobacco: Never Used  Substance and Sexual Activity  . Alcohol use: Yes    Comment: occ beer  . Drug use: No  . Sexual activity: Never  Other Topics Concern  . Not on file  Social History Narrative  . Not on file   Social Determinants of Health   Financial Resource Strain:   . Difficulty of Paying Living Expenses:   Food Insecurity:   . Worried About RCharity fundraiserin the Last Year:   . RArboriculturistin the Last Year:   Transportation Needs:   . LFilm/video editor(Medical):   .Marland KitchenLack of Transportation (Non-Medical):   Physical Activity:   . Days of Exercise per Week:   . Minutes of Exercise per Session:   Stress:   . Feeling of Stress :   Social Connections:   . Frequency of Communication with Friends and Family:   . Frequency of Social Gatherings with Friends and Family:   . Attends Religious Services:   . Active Member of Clubs or Organizations:   . Attends CArchivistMeetings:   .Marland KitchenMarital Status:    Outpatient Encounter Medications as of 02/11/2020  Medication Sig   . ALPHA LIPOIC ACID PO Take 1 tablet by mouth 2 (two) times daily.  . Chromium 400 MCG TABS Take 1 tablet by mouth 2 (two) times daily.  . Cinnamon 500 MG capsule Take 500 mg by mouth daily.  .Nyoka CowdenTea, Camellia sinensis, (GREEN TEA EXTRACT PO) Take 1 tablet by mouth daily.  .Marland Kitchenapixaban (ELIQUIS) 5 MG TABS tablet Take 1 tablet (5 mg total) by mouth 2 (two) times daily.  . blood glucose meter kit and supplies Dispense based on patient and insurance preference. Use up to two times daily as directed. (FOR ICD-E11.65)  . diazepam (VALIUM) 5 MG tablet Take 5 mg by mouth 2 (two) times daily.  .Marland Kitchenescitalopram (LEXAPRO) 20 MG tablet Take 20 mg daily by mouth.   . fenofibrate micronized (LOFIBRA) 134  MG capsule Take 134 mg by mouth at bedtime.  . Fluticasone-Umeclidin-Vilant (TRELEGY ELLIPTA) 100-62.5-25 MCG/INH AEPB Inhale 1 puff daily into the lungs.   . furosemide (LASIX) 80 MG tablet Take 80 mg by mouth daily.  Marland Kitchen glipiZIDE (GLUCOTROL XL) 5 MG 24 hr tablet Take 2 tablets (10 mg total) by mouth daily with breakfast.  . Insulin Isophane & Regular Human (HUMULIN 70/30 MIX) (70-30) 100 UNIT/ML PEN Inject 80 Units into the skin 2 (two) times daily with a meal. 80 units with breakfast, 80 units with supper when BG above 90  . isosorbide mononitrate (IMDUR) 60 MG 24 hr tablet Take 1 tablet (60 mg total) by mouth 3 (three) times daily. Please make an appointment for additional refills (Patient taking differently: Take 60 mg by mouth 2 (two) times daily. )  . JANUVIA 50 MG tablet Take 1 tablet by mouth once daily  . lisinopril (PRINIVIL,ZESTRIL) 5 MG tablet Take 5 mg by mouth daily.  . meclizine (ANTIVERT) 25 MG tablet Take 25 mg by mouth 2 (two) times daily.   . metoprolol tartrate (LOPRESSOR) 100 MG tablet Take 1 tablet (100 mg total) by mouth 2 (two) times daily. OFFICE VISIT NEEDED BEFORE ADDITIONAL REFILLS  . modafinil (PROVIGIL) 200 MG tablet Take 200 mg by mouth daily.  . nitroGLYCERIN (NITROLINGUAL)  0.4 MG/SPRAY spray Place 1 spray under the tongue every 5 (five) minutes x 3 doses as needed for chest pain.  . pantoprazole (PROTONIX) 40 MG tablet Take 1 tablet (40 mg total) by mouth 2 (two) times daily before a meal.  . potassium chloride SA (K-DUR,KLOR-CON) 20 MEQ tablet Take 1 tablet (20 mEq total) by mouth 2 (two) times daily.  . rosuvastatin (CRESTOR) 20 MG tablet Take 20 mg by mouth daily.    No facility-administered encounter medications on file as of 02/11/2020.    ALLERGIES: Allergies  Allergen Reactions  . Contrast Media [Iodinated Diagnostic Agents] Other (See Comments)    Pt must be premedicated before given contrast media - stops heart  . Iohexol Other (See Comments)     Consult with radiologist before pre meds are given.Desc: PT. STATES HEART STOPPED HAS TO BE PREMED.     VACCINATION STATUS:  There is no immunization history on file for this patient.  Diabetes He presents for his follow-up diabetic visit. He has type 2 diabetes mellitus. Onset time: He was diagnosed at approximate age of 21 years. His disease course has been worsening. There are no hypoglycemic associated symptoms. Pertinent negatives for hypoglycemia include no confusion, headaches, pallor or seizures. Associated symptoms include polydipsia and polyuria. Pertinent negatives for diabetes include no blurred vision, no chest pain, no fatigue, no polyphagia and no weakness. There are no hypoglycemic complications. Symptoms are worsening. Diabetic complications include a CVA and heart disease. Risk factors for coronary artery disease include diabetes mellitus, dyslipidemia, hypertension, male sex, obesity, tobacco exposure, sedentary lifestyle and family history. Current diabetic treatments: He was supposed to be on basal insulin, unfortunately he ran out and could not afford refills and he did not call clinic for alternatives. He is compliant with treatment none of the time. His weight is fluctuating minimally. He  is following a generally unhealthy diet. When asked about meal planning, he reported none. He has not had a previous visit with a dietitian. He never participates in exercise. His home blood glucose trend is increasing steadily. His breakfast blood glucose range is generally >200 mg/dl. His dinner blood glucose range is generally >200 mg/dl.  His overall blood glucose range is >200 mg/dl. (Is reporting blood glucose before breakfast clinic 271, 402, 291, 339, 247, 227, 362 Before supper: 421, 357, 311, 232, 260, 240, 304  His previsit labs show A1c of 8.5% improving from 9.2%.) An ACE inhibitor/angiotensin II receptor blocker is being taken. He does not see a podiatrist.Eye exam is not current.  Hyperlipidemia This is a chronic problem. The current episode started more than 1 year ago. The problem is uncontrolled. Recent lipid tests were reviewed and are high. Exacerbating diseases include diabetes and obesity. Pertinent negatives include no chest pain, myalgias or shortness of breath. Current antihyperlipidemic treatment includes statins. Compliance problems include medication cost and psychosocial issues.  Risk factors for coronary artery disease include dyslipidemia, diabetes mellitus, hypertension, male sex, obesity, family history and a sedentary lifestyle.  Hypertension This is a chronic problem. The current episode started more than 1 year ago. The problem is uncontrolled. Pertinent negatives include no blurred vision, chest pain, headaches, neck pain, palpitations or shortness of breath. Risk factors for coronary artery disease include diabetes mellitus, dyslipidemia, male gender, obesity, family history, sedentary lifestyle and smoking/tobacco exposure. Past treatments include ACE inhibitors. Hypertensive end-organ damage includes CVA.    Review of systems  Constitutional: No success in weight loss,  current  Body mass index is 43.33 kg/m. , no fatigue, no subjective hyperthermia, no subjective  hypothermia Eyes: no blurry vision, no xerophthalmia ENT: no sore throat, no nodules palpated in throat, no dysphagia/odynophagia, no hoarseness Cardiovascular: no Chest Pain, no Shortness of Breath, no palpitations, no leg swelling Respiratory: + cough, + shortness of breath Gastrointestinal: no Nausea/Vomiting/Diarhhea Musculoskeletal: no muscle/joint aches Skin: no rashes, no hyperemia Neurological: no tremors, no numbness, no tingling, no dizziness Psychiatric: no depression, no anxiety   Objective:    BP 124/83   Pulse 84   Ht '5\' 10"'  (1.778 m)   Wt (!) 302 lb (137 kg)   BMI 43.33 kg/m   Wt Readings from Last 3 Encounters:  02/11/20 (!) 302 lb (137 kg)  01/16/20 (!) 306 lb 6.4 oz (139 kg)  07/03/19 (!) 300 lb 6.4 oz (136.3 kg)     Physical Exam- Limited  Constitutional:  Body mass index is 43.33 kg/m. , not in acute distress, normal state of mind Eyes:  EOMI, no exophthalmos Neck: Supple Thyroid: No gross goiter Respiratory: Adequate breathing efforts Musculoskeletal: no gross deformities, strength intact in all four extremities, no gross restriction of joint movements Skin:  no rashes, no hyperemia Neurological: no tremor with outstretched hands,    CMP ( most recent) CMP     Component Value Date/Time   NA 139 10/11/2019 1141   K 4.4 10/11/2019 1141   CL 101 10/11/2019 1141   CO2 25 10/11/2019 1141   GLUCOSE 290 (H) 10/11/2019 1141   GLUCOSE 209 (H) 01/30/2019 1320   BUN 11 10/11/2019 1141   CREATININE 0.89 10/11/2019 1141   CALCIUM 9.8 10/11/2019 1141   PROT 6.5 10/11/2019 1141   ALBUMIN 4.1 10/11/2019 1141   AST 28 10/11/2019 1141   ALT 41 10/11/2019 1141   ALKPHOS 96 10/11/2019 1141   BILITOT 0.3 10/11/2019 1141   GFRNONAA 96 10/11/2019 1141   GFRAA 110 10/11/2019 1141   Diabetic Labs (most recent): Lab Results  Component Value Date   HGBA1C 8.5 (H) 11/07/2019   HGBA1C 9.2 (H) 07/03/2019   HGBA1C 7.3 (H) 04/02/2019     Lipid Panel ( most  recent) Lipid Panel  Component Value Date/Time   CHOL 132 07/03/2019 1324   TRIG 315 (H) 07/03/2019 1324   HDL 30 (L) 07/03/2019 1324   CHOLHDL 6.1 04/28/2014 0121   VLDL 61 (H) 04/28/2014 0121   LDLCALC 53 07/03/2019 1324     Assessment & Plan:   1. DM type 2 causing vascular disease ([place])  - Patient has currently uncontrolled symptomatic type 2 DM since  59 years of age.  -He returns with significantly above target glycemic profile averaging 200-250-250 mg per DL, point-of-care A1c of 9.7% increasing from 8.5%.  This is despite high dose of premixed insulin Humulin 70/30 80 units twice daily.    -his diabetes is complicated by coronary artery disease, CVA, obesity/sedentary life, chronic heavy smoking and HAIDAR MUSE remains at extremely high risk  for more acute and chronic complications which include CAD, CVA, CKD, retinopathy, and neuropathy. These are all discussed in detail with the patient.  - I have counseled him on diet management and weight loss, by adopting a carbohydrate restricted/protein rich diet.  - he  admits there is a room for improvement in his diet and drink choices. -  Suggestion is made for him to avoid simple carbohydrates  from his diet including Cakes, Sweet Desserts / Pastries, Ice Cream, Soda (diet and regular), Sweet Tea, Candies, Chips, Cookies, Sweet Pastries,  Store Bought Juices, Alcohol in Excess of  1-2 drinks a day, Artificial Sweeteners, Coffee Creamer, and "Sugar-free" Products. This will help patient to have stable blood glucose profile and potentially avoid unintended weight gain.  - I encouraged him to switch to  unprocessed or minimally processed complex starch and increased protein intake (animal or plant source), fruits, and vegetables.  - he is advised to stick to a routine mealtimes to eat 3 meals  a day and avoid unnecessary snacks ( to snack only to correct hypoglycemia).   - I have approached him with the following  individualized plan to manage diabetes and patient agrees:   - He has significant cognitive deficit, is having hard time controlling glycemic profile with his current insulin regimen.  He will be considered for insulin U500 3 times daily instead of Humulin 70/30 twice daily.   -he has enough for approximately 20 days, advised to continue his  Humulin  80 units with breakfast and 80 units with supper, for premeal blood glucose readings above 90 mg per DL, advised to start monitoring blood glucose 4 times a day-before meals and at bedtime and return in 20 days for consideration of insulin U500.  I did not prescribe this medication for him yet to avoid duplicate injections.  He is advised to bring in any left over Humulin 70/30 with him next visit.  -He is advised to continue Januvia 50 mg p.o. daily with breakfast.   -He has benefited from glipizide treatment.  He is advised to continue glipizide 10 mg XL p.o. daily at breakfast.    -He does not tolerate metformin due to GI side effects.   -Patient is encouraged to call clinic for blood glucose levels less than 70 or above 300 mg /dl.  - Patient specific target  A1c;  LDL, HDL, Triglycerides,  were discussed in detail.  2) BP/HTN:   His blood pressure is controlled to target.  The patient continues to smoke heavily. The patient was counseled on the dangers of tobacco use, and was advised to quit.  Reviewed strategies to maximize success, including removing cigarettes and smoking materials from environment.  He is advised to continue his current blood pressure medications consistently including lisinopril 5 mg p.o. daily.   3) Lipids/HPL: His  lipid panel is not controlled  with severe hypertriglyceridemia   of 315 improving from 469.  He is advised to continue Trilipix 135 mg p.o. nightly.    4)  Weight/Diet: His BMI is [id number] complicating his diabetes care.  He is a candidate for modest weight loss.  CDE Consult has been  initiated ,  exercise, and detailed carbohydrates information provided.  5) vitamin D deficiency: I discussed and initiated vitamin D2 50,000 units weekly for 12 weeks.  6) Chronic Care/Health Maintenance:  -he  is on ACEI/ARB and Statin medications and  is encouraged to continue to follow up with Ophthalmology, Dentist,  Podiatrist at least yearly or according to recommendations, and advised to  quit smoking. I have recommended yearly flu vaccine and pneumonia vaccination at least every 5 years; moderate intensity exercise for up to 150 minutes weekly; and  sleep for at least 7 hours a day.   - I advised patient to maintain close follow up with Sharilyn Sites, MD for primary care needs.  - Time spent on this patient care encounter:  35 min, of which > 50% was spent in  counseling and the rest reviewing his blood glucose logs , discussing his hypoglycemia and hyperglycemia episodes, reviewing his current and  previous labs / studies  ( including abstraction from other facilities) and medications  doses and developing a  long term treatment plan and documenting his care.   Please refer to Patient Instructions for Blood Glucose Monitoring and Insulin/Medications Dosing Guide"  in media tab for additional information. Please  also refer to " Patient Self Inventory" in the Media  tab for reviewed elements of pertinent patient history.  [name] participated in the discussions, expressed understanding, and voiced agreement with the above plans.  All questions were answered to his satisfaction. he is encouraged to contact clinic should he have any questions or concerns prior to his return visit.   Follow up plan: - Return in about 20 days (around 03/02/2020) for Follow up with Meter and Logs Only - no Labs.   Glade Lloyd, MD Phone: [phone]  Fax: [phone]   02/11/2020, 6:12 PM   This note was partially dictated with voice recognition software. Similar sounding words can be transcribed  inadequately or may not  be corrected upon review.

## 2020-02-14 ENCOUNTER — Telehealth: Payer: Self-pay | Admitting: "Endocrinology

## 2020-02-14 ENCOUNTER — Telehealth: Payer: Self-pay | Admitting: Gastroenterology

## 2020-02-14 NOTE — Telephone Encounter (Signed)
Called pt and verified name and dob notified him that if he wants to transfer pharmacies he will need to contact [facility] or which ever pharmacy he decides ) to transfer to and notified him that he has refills at [facility] and ask that they transfer them pt thanked me for the call

## 2020-02-14 NOTE — Telephone Encounter (Signed)
Left a message with pt requesting a return call to the office.

## 2020-02-14 NOTE — Telephone Encounter (Signed)
Patient's PMD called and said he is using [facility] now and needs new RX's for Januvia, Glipizide, One touch verio lancets, strips and needles. [facility]

## 2020-02-14 NOTE — Telephone Encounter (Signed)
Patient pcp called and said he is changing pharmacies, he is now using [facility] and needs his pantoprozole prescription sent there

## 2020-02-15 MED ORDER — GLUCOSE BLOOD VI STRP: 1.0000 | ORAL_STRIP | Freq: Four times a day (QID) | 1 refills | Status: DC

## 2020-02-15 MED ORDER — GLIPIZIDE ER 5 MG PO TB24: 10.0000 mg | ORAL_TABLET | Freq: Every day | ORAL | 0 refills | Status: DC

## 2020-02-15 MED ORDER — SITAGLIPTIN PHOSPHATE 50 MG PO TABS: 50.0000 mg | ORAL_TABLET | Freq: Every day | ORAL | 0 refills | Status: DC

## 2020-02-15 MED ORDER — ONETOUCH DELICA LANCETS 33G MISC: 1.0000 | Freq: Four times a day (QID) | 1 refills | Status: DC

## 2020-02-15 NOTE — Telephone Encounter (Signed)
Sent prescription refills to [facility].

## 2020-02-21 DIAGNOSIS — K219 Gastro-esophageal reflux disease without esophagitis: Secondary | ICD-10-CM | POA: Diagnosis not present

## 2020-02-21 DIAGNOSIS — R070 Pain in throat: Secondary | ICD-10-CM | POA: Diagnosis not present

## 2020-02-22 DIAGNOSIS — I480 Paroxysmal atrial fibrillation: Secondary | ICD-10-CM | POA: Diagnosis not present

## 2020-02-22 DIAGNOSIS — I10 Essential (primary) hypertension: Secondary | ICD-10-CM | POA: Diagnosis not present

## 2020-02-22 DIAGNOSIS — Z794 Long term (current) use of insulin: Secondary | ICD-10-CM | POA: Diagnosis not present

## 2020-02-22 DIAGNOSIS — E1159 Type 2 diabetes mellitus with other circulatory complications: Secondary | ICD-10-CM | POA: Diagnosis not present

## 2020-02-22 DIAGNOSIS — I25810 Atherosclerosis of coronary artery bypass graft(s) without angina pectoris: Secondary | ICD-10-CM | POA: Diagnosis not present

## 2020-02-26 ENCOUNTER — Other Ambulatory Visit: Payer: Self-pay

## 2020-02-26 ENCOUNTER — Encounter: Payer: Self-pay | Admitting: Pulmonary Disease

## 2020-02-26 ENCOUNTER — Ambulatory Visit: Payer: Medicare HMO | Admitting: Pulmonary Disease

## 2020-02-26 VITALS — BP 110/70 | HR 78 | Temp 97.8°F | Ht 70.0 in | Wt 304.0 lb

## 2020-02-26 DIAGNOSIS — J449 Chronic obstructive pulmonary disease, unspecified: Secondary | ICD-10-CM

## 2020-02-26 DIAGNOSIS — R911 Solitary pulmonary nodule: Secondary | ICD-10-CM

## 2020-02-26 DIAGNOSIS — F17200 Nicotine dependence, unspecified, uncomplicated: Secondary | ICD-10-CM

## 2020-02-26 DIAGNOSIS — G4733 Obstructive sleep apnea (adult) (pediatric): Secondary | ICD-10-CM

## 2020-02-26 DIAGNOSIS — R69 Illness, unspecified: Secondary | ICD-10-CM | POA: Diagnosis not present

## 2020-02-26 MED ORDER — ALBUTEROL SULFATE HFA 108 (90 BASE) MCG/ACT IN AERS: 2.0000 | INHALATION_SPRAY | Freq: Four times a day (QID) | RESPIRATORY_TRACT | 3 refills | Status: DC | PRN

## 2020-02-26 NOTE — Progress Notes (Signed)
Subjective:    Patient ID: [name], male    DOB: [birth year], 59 y.o.   MRN: [id number]  HPI   Chief Complaint  Patient presents with  . Consult    Patient has COPD and has shortness of breath all the time. Patient has a cough sometimes dry sometimes productive with green mucus.    59 year old obese smoker presents to establish care for COPD and OSA and lung nodules.  Prior patient of Dr. Luan Pulling  He smokes more than 40 pack years, has now cut down to half pack per day.  He is maintained on a regimen of Trelegy and albuterol MDI.  He uses his nebs about 2-3 times a week. Lung nodules were initially noted on a CT scan in 2017 and since then he has had follow-up with his last scan being in 12/2018 which we reviewed, right upper lobe nodule appears slightly increased to 9 mm other nodules were stable.  Advised changes of emphysema noted.  I did not see any PFTs on chart review.  OSA was diagnosed in 2009, he was initially on BiPAP for a period of time since he could not tolerate CPAP 16/13, repeat titration was performed in 2017 with his weight being 297 pounds and since then he has been maintained on CPAP of 8 cm with 3 L oxygen blended in. Due to persistent hypersomnolence modafinil has been started  Reports dyspnea on walking short distances NYHA class II, denies pedal edema orthopnea proximal nocturnal dyspnea, has occasional dry cough. He reports compliance with his CPAP, DME is adapt with full facemask  PMH -CABG 2008 follows with [facility] cardiology, diabetes, insulin requiring  Significant tests/ events reviewed CT chest 12/2018 advanced changes of emphysema , multiple noncalcified nodules unchanged compared to 09/2018, largest right upper lobe 9 mm slight increased compared to 12/2016  NPSG 11/2007 mild OSA 5.7/hour 07/2015 NPSG AHI 16/h  06/2016 CPAP titration-2 9 7  pounds, no O2 required CPAP 8cm  Past Medical History:  Diagnosis Date  . Abnormal myocardial perfusion  study 01/01/2011   there a small to moderate sized inferobasal scar  . Barrett's esophagus   . Cataracts, bilateral   . Chronic kidney disease    hx of kidney stones  . Claudication ([place]) 11/16/2011   PV test perform shows normal  . COPD (chronic obstructive pulmonary disease) ([place])   . Depression   . Diabetes ([place])    type 2 diabetes mellitus  . Dysrhythmia   . GERD (gastroesophageal reflux disease)   . Glaucoma   . Hernia of abdominal wall   . HTN (hypertension)   . Hyperlipidemia   . Morbid obesity ([place])   . Myocardial infarction ([place]) 2008,2009,2009  . OSA (obstructive sleep apnea)    on cpap  . PAF (paroxysmal atrial fibrillation) ([place])   . S/P colonoscopy 2009   3-4 mm transverse colon erosions likely secondary to  ASA  . S/P endoscopy Dec 2011   moderate erosive gastritis, Barrett's esophagus 1-2cm  . Sleep apnea   . SOB (shortness of breath) 11/03/2007   2D Echo EF 50%-55%  . Stroke [facility])     Past Surgical History:  Procedure Laterality Date  . BIOPSY N/A 04/29/2015   Procedure: BIOPSY;  Surgeon: Danie Binder, MD;  Location: [facility];  Service: Endoscopy;  Laterality: N/A;  . BIOPSY  08/01/2018   Procedure: BIOPSY;  Surgeon: Danie Binder, MD;  Location: [facility];  Service: Endoscopy;;  esophageal  . CABG  X 4  03/2008  . CARDIAC CATHETERIZATION  2009   stent placement to the left circumflex a 2.25   . CARDIAC CATHETERIZATION  07/08/2010  . COLONOSCOPY WITH PROPOFOL N/A 09/27/2017   normal ileum, twenty 4 to 8 mm polyps in the sigmoid colon, descending colon, splenic flexure, transverse colon, ascending colon, cecum.  An additional three 2 to 4 mm polyps in the rectum and the descending colon.  Diverticulosis, internal hemorrhoids.  Surgical pathology found the polyps to be one fragment of hyperplastic polyp and 22 fragments of tubular adenoma..  Recommended repeat colonoscop  . CORONARY ARTERY BYPASS GRAFT  2008   4 vessels  . CORONARY STENT PLACEMENT   12/29/12  . CORONARY STENT PLACEMENT  12/2012  . ESOPHAGEAL DILATION N/A 04/29/2015   Procedure: ESOPHAGEAL DILATION 15 mm, 16 mm;  Surgeon: Danie Binder, MD;  Location: [facility];  Service: Endoscopy;  Laterality: N/A;  . ESOPHAGOGASTRODUODENOSCOPY  09/2011   Barrett's esophagus, no dysplasia on biopsies. Distal esophagitis. Status post dilation. Moderate gastritis and duodenitis, but biopsies benign. Next EGD in November 2015 for surveillance of Barrett's esophagus.  . ESOPHAGOGASTRODUODENOSCOPY (EGD) WITH PROPOFOL N/A 04/29/2015   SLF: 1. Barretts esophagus 2. Moderate non-erosive gastritis.   Marland Kitchen ESOPHAGOGASTRODUODENOSCOPY (EGD) WITH PROPOFOL N/A 08/01/2018   Barrett's, repeat in 5 years. Empiric dilatation, mild gastritis  . HERNIA REPAIR     ventral hernia repair  . LEFT HEART CATHETERIZATION WITH CORONARY/GRAFT ANGIOGRAM N/A 12/29/2012   Procedure: LEFT HEART CATHETERIZATION WITH Beatrix Fetters;  Surgeon: Troy Sine, MD;  Location: [facility] CATH LAB;  Service: Cardiovascular;  Laterality: N/A;  . ORIF FIBULA FRACTURE Right 06/03/2015   Procedure: OPEN REDUCTION INTERNAL FIXATION (ORIF) DISTAL FIBULA  FRACTURE;  Surgeon: Garald Balding, MD;  Location: [facility];  Service: Orthopedics;  Laterality: Right;  . PERCUTANEOUS CORONARY STENT INTERVENTION (PCI-S)  12/29/2012   Procedure: PERCUTANEOUS CORONARY STENT INTERVENTION (PCI-S);  Surgeon: Troy Sine, MD;  Location: [facility] CATH LAB;  Service: Cardiovascular;;  . POLYPECTOMY  09/27/2017   Procedure: POLYPECTOMY;  Surgeon: Danie Binder, MD;  [facility];  Service: Endoscopy;;  cecal polyp, ascending polyps x6, transverse colon polyps x6, splenic flexure polyps x2, descending colon polyps x6, sigmoid  colon polyp x1, rectal polyp x1   . SAVORY DILATION  09/06/2011   Procedure: SAVORY DILATION;  Surgeon: Dorothyann Peng, MD;  Location: [facility];  Service: Endoscopy;  Laterality: N/A;  Dilated with 23mm  . SAVORY DILATION N/A 08/01/2018    Procedure: SAVORY DILATION;  Surgeon: Danie Binder, MD;  [facility];  Service: Endoscopy;  Laterality: N/A;    Allergies  Allergen Reactions  . Contrast Media [Iodinated Diagnostic Agents] Other (See Comments)    Pt must be premedicated before given contrast media - stops heart  . Iohexol Other (See Comments)     Consult with radiologist before pre meds are given.Desc: PT. STATES HEART STOPPED HAS TO BE PREMED.     Social History   Socioeconomic History  . Marital status: Divorced    Spouse name: Not on file  . Number of children: Not on file  . Years of education: Not on file  . Highest education level: Not on file  Occupational History  . Not on file  Tobacco Use  . Smoking status: Current Every Day Smoker    Packs/day: 0.50    Years: 30.00    Pack years: 15.00    Types: Cigarettes  .  Smokeless tobacco: Never Used  Substance and Sexual Activity  . Alcohol use: Yes    Comment: occ beer  . Drug use: No  . Sexual activity: Never  Other Topics Concern  . Not on file  Social History Narrative  . Not on file   Social Determinants of Health   Financial Resource Strain:   . Difficulty of Paying Living Expenses:   Food Insecurity:   . Worried About Charity fundraiser in the Last Year:   . Arboriculturist in the Last Year:   Transportation Needs:   . Film/video editor (Medical):   Marland Kitchen Lack of Transportation (Non-Medical):   Physical Activity:   . Days of Exercise per Week:   . Minutes of Exercise per Session:   Stress:   . Feeling of Stress :   Social Connections:   . Frequency of Communication with Friends and Family:   . Frequency of Social Gatherings with Friends and Family:   . Attends Religious Services:   . Active Member of Clubs or Organizations:   . Attends Archivist Meetings:   Marland Kitchen Marital Status:   Intimate Partner Violence:   . Fear of Current or Ex-Partner:   . Emotionally Abused:   Marland Kitchen Physically Abused:   . Sexually  Abused:     Family History  Problem Relation Age of Onset  . Diabetes Mother   . Heart disease Mother   . Diabetes Father   . Heart disease Father   . Pancreatic cancer Brother        age 109, doing well  . Colon cancer Neg Hx   . Anesthesia problems Neg Hx   . Hypotension Neg Hx   . Malignant hyperthermia Neg Hx   . Pseudochol deficiency Neg Hx      Review of Systems Constitutional: negative for anorexia, fevers and sweats  Eyes: negative for irritation, redness and visual disturbance  Ears, nose, mouth, throat, and face: negative for earaches, epistaxis, nasal congestion and sore throat  Respiratory: negative for  sputum and wheezing  Cardiovascular: negative for orthopnea, palpitations and syncope  Gastrointestinal: negative for abdominal pain, constipation, diarrhea, melena, nausea and vomiting  Genitourinary:negative for dysuria, frequency and hematuria  Hematologic/lymphatic: negative for bleeding, easy bruising and lymphadenopathy  Musculoskeletal:negative for arthralgias, muscle weakness and stiff joints  Neurological: negative for coordination problems, gait problems, headaches and weakness  Endocrine: negative for diabetic symptoms including polydipsia, polyuria and weight loss     Objective:   Physical Exam  Gen. Pleasant, obese, in no distress, normal affect ENT - no pallor,icterus, no post nasal drip, class 2-3 airway Neck: No JVD, no thyromegaly, no carotid bruits Lungs: no use of accessory muscles, no dullness to percussion, decreased without rales or rhonchi  Cardiovascular: Rhythm regular, heart sounds  normal, no murmurs or gallops, no peripheral edema Abdomen: soft and non-tender, no hepatosplenomegaly, BS normal. Musculoskeletal: No deformities, no cyanosis or clubbing Neuro:  alert, non focal, no tremors       Assessment & Plan:

## 2020-02-26 NOTE — Assessment & Plan Note (Signed)
Of concern is right upper lobe 9 mm nodule which has slightly increased in size compared to 2017. We will obtain 18-month follow-up CT scan without contrast to see if this is changed in size

## 2020-02-26 NOTE — Patient Instructions (Signed)
  CT chest without contrast to follow-up on lung nodules. Schedule PFTs at [facility] office Obtain CPAP report from [facility]  Refills on albuterol and Trelegy

## 2020-02-26 NOTE — Assessment & Plan Note (Signed)
He was previously on BiPAP but is currently maintained on CPAP 8 cm with 3L O2 blended in Titration study in 2017 did not note any oxygen requirements. We will continue this for now and reassess with CPAP download and nocturnal oximetry off oxygen in the future Weight loss encouraged, compliance with goal of at least 4-6 hrs every night is the expectation. Advised against medications with sedative side effects Cautioned against driving when sleepy - understanding that sleepiness will vary on a day to day basis

## 2020-02-26 NOTE — Assessment & Plan Note (Signed)
Smoking cessation emphasized is the most important intervention I will add years to his life. Also discussed direct correlation of his smoking status with lung nodules

## 2020-02-26 NOTE — Assessment & Plan Note (Signed)
Schedule PFTs at [facility] office  Refills on albuterol and Trelegy will be provided.

## 2020-02-27 DIAGNOSIS — I25810 Atherosclerosis of coronary artery bypass graft(s) without angina pectoris: Secondary | ICD-10-CM | POA: Diagnosis not present

## 2020-02-28 ENCOUNTER — Other Ambulatory Visit: Payer: Self-pay

## 2020-02-28 ENCOUNTER — Telehealth: Payer: Self-pay | Admitting: Pulmonary Disease

## 2020-02-28 ENCOUNTER — Telehealth: Payer: Self-pay | Admitting: "Endocrinology

## 2020-02-28 MED ORDER — PEN NEEDLES 31G X 8 MM MISC: 1.0000 | Freq: Two times a day (BID) | 3 refills | Status: DC

## 2020-02-28 MED ORDER — TRELEGY ELLIPTA 100-62.5-25 MCG/INH IN AEPB: 1.0000 | INHALATION_SPRAY | Freq: Every day | RESPIRATORY_TRACT | 3 refills | Status: DC

## 2020-02-28 NOTE — Telephone Encounter (Signed)
Rx sent 

## 2020-02-28 NOTE — Telephone Encounter (Signed)
Pt requesting refill on B-D ULTRAFINE III SHORT PEN 31G X 8 MM MISC [[phone]] [place]

## 2020-02-28 NOTE — Telephone Encounter (Signed)
Received patient assistance forms from patient at appointment. Prescription has been printed and will be signed my Dr. Elsworth Soho tomorrow and then paperwork will be faxed to [place]. Will update once faxed.

## 2020-02-28 NOTE — Progress Notes (Deleted)
Received patient assistance forms from patient at appointment. Prescription has been printed and will be signed my Dr. Elsworth Soho tomorrow and then paperwork will be faxed to [place]. Will update once faxed.

## 2020-02-28 NOTE — Addendum Note (Signed)
Addended by: Lia Foyer R on: 02/28/2020 05:21 PM   Modules accepted: Orders

## 2020-02-29 DIAGNOSIS — Z720 Tobacco use: Secondary | ICD-10-CM | POA: Diagnosis not present

## 2020-02-29 DIAGNOSIS — J449 Chronic obstructive pulmonary disease, unspecified: Secondary | ICD-10-CM | POA: Diagnosis not present

## 2020-02-29 DIAGNOSIS — I4891 Unspecified atrial fibrillation: Secondary | ICD-10-CM | POA: Diagnosis not present

## 2020-02-29 DIAGNOSIS — I25700 Atherosclerosis of coronary artery bypass graft(s), unspecified, with unstable angina pectoris: Secondary | ICD-10-CM | POA: Diagnosis not present

## 2020-02-29 MED ORDER — TRELEGY ELLIPTA 100-62.5-25 MCG/INH IN AEPB: 1.0000 | INHALATION_SPRAY | Freq: Every day | RESPIRATORY_TRACT | 12 refills | Status: DC

## 2020-02-29 NOTE — Addendum Note (Signed)
Addended by: Lia Foyer R on: 02/29/2020 09:33 AM   Modules accepted: Orders

## 2020-03-03 DIAGNOSIS — R69 Illness, unspecified: Secondary | ICD-10-CM | POA: Diagnosis not present

## 2020-03-04 ENCOUNTER — Other Ambulatory Visit: Payer: Self-pay

## 2020-03-04 ENCOUNTER — Encounter: Payer: Self-pay | Admitting: "Endocrinology

## 2020-03-04 ENCOUNTER — Ambulatory Visit (INDEPENDENT_AMBULATORY_CARE_PROVIDER_SITE_OTHER): Payer: Medicare HMO | Admitting: "Endocrinology

## 2020-03-04 VITALS — BP 145/84 | HR 90 | Ht 70.0 in | Wt 301.0 lb

## 2020-03-04 DIAGNOSIS — E1159 Type 2 diabetes mellitus with other circulatory complications: Secondary | ICD-10-CM | POA: Diagnosis not present

## 2020-03-04 DIAGNOSIS — E559 Vitamin D deficiency, unspecified: Secondary | ICD-10-CM | POA: Diagnosis not present

## 2020-03-04 DIAGNOSIS — R69 Illness, unspecified: Secondary | ICD-10-CM | POA: Diagnosis not present

## 2020-03-04 DIAGNOSIS — E782 Mixed hyperlipidemia: Secondary | ICD-10-CM

## 2020-03-04 DIAGNOSIS — I10 Essential (primary) hypertension: Secondary | ICD-10-CM

## 2020-03-04 DIAGNOSIS — F17200 Nicotine dependence, unspecified, uncomplicated: Secondary | ICD-10-CM

## 2020-03-04 MED ORDER — HUMULIN R U-500 KWIKPEN 500 UNIT/ML ~~LOC~~ SOPN: 50.0000 [IU] | PEN_INJECTOR | Freq: Three times a day (TID) | SUBCUTANEOUS | 2 refills | Status: DC

## 2020-03-04 MED ORDER — HUMULIN R U-500 KWIKPEN 500 UNIT/ML ~~LOC~~ SOPN
50.0000 [IU] | PEN_INJECTOR | Freq: Three times a day (TID) | SUBCUTANEOUS | 2 refills | Status: DC
Start: 2020-03-04 — End: 2020-03-04

## 2020-03-04 MED ORDER — LISINOPRIL 10 MG PO TABS: 10.0000 mg | ORAL_TABLET | Freq: Every day | ORAL | 1 refills | Status: DC

## 2020-03-04 NOTE — Patient Instructions (Signed)

## 2020-03-04 NOTE — Progress Notes (Signed)
03/04/2020   Endocrinology follow-up note    Subjective:    Patient ID: [name], male    DOB: [birth year].  he is being seen in follow-up  for management of currently uncontrolled symptomatic type 2 diabetes, hypertension, hyperlipidemia, obesity. PMD:  Sharilyn Sites, MD.   Past Medical History:  Diagnosis Date  . Abnormal myocardial perfusion study 01/01/2011   there a small to moderate sized inferobasal scar  . Barrett's esophagus   . Cataracts, bilateral   . Chronic kidney disease    hx of kidney stones  . Claudication ([place]) 11/16/2011   PV test perform shows normal  . COPD (chronic obstructive pulmonary disease) ([place])   . Depression   . Diabetes ([place])    type 2 diabetes mellitus  . Dysrhythmia   . GERD (gastroesophageal reflux disease)   . Glaucoma   . Hernia of abdominal wall   . HTN (hypertension)   . Hyperlipidemia   . Morbid obesity ([place])   . Myocardial infarction ([place]) 2008,2009,2009  . OSA (obstructive sleep apnea)    on cpap  . PAF (paroxysmal atrial fibrillation) ([place])   . S/P colonoscopy 2009   3-4 mm transverse colon erosions likely secondary to  ASA  . S/P endoscopy Dec 2011   moderate erosive gastritis, Barrett's esophagus 1-2cm  . Sleep apnea   . SOB (shortness of breath) 11/03/2007   2D Echo EF 50%-55%  . Stroke [facility])    Past Surgical History:  Procedure Laterality Date  . BIOPSY N/A 04/29/2015   Procedure: BIOPSY;  Surgeon: Danie Binder, MD;  Location: [facility] ORS;  Service: Endoscopy;  Laterality: N/A;  . BIOPSY  08/01/2018   Procedure: BIOPSY;  Surgeon: Danie Binder, MD;  Location: [facility] SU[facility];  Service: Endoscopy;;  esophageal  . CABG X 4  03/2008  . CARDIAC CATHETERIZATION  2009   stent placement to the left circumflex a 2.25   . CARDIAC CATHETERIZATION  07/08/2010  . COLONOSCOPY WITH PROPOFOL N/A 09/27/2017   normal ileum, twenty 4 to 8 mm polyps in the sigmoid colon, descending colon, splenic flexure, transverse colon,  ascending colon, cecum.  An additional three 2 to 4 mm polyps in the rectum and the descending colon.  Diverticulosis, internal hemorrhoids.  Surgical pathology found the polyps to be one fragment of hyperplastic polyp and 22 fragments of tubular adenoma..  Recommended repeat colonoscop  . CORONARY ARTERY BYPASS GRAFT  2008   4 vessels  . CORONARY STENT PLACEMENT  12/29/12  . CORONARY STENT PLACEMENT  12/2012  . ESOPHAGEAL DILATION N/A 04/29/2015   Procedure: ESOPHAGEAL DILATION 15 mm, 16 mm;  Surgeon: Danie Binder, MD;  Location: [facility];  Service: Endoscopy;  Laterality: N/A;  . ESOPHAGOGASTRODUODENOSCOPY  09/2011   Barrett's esophagus, no dysplasia on biopsies. Distal esophagitis. Status post dilation. Moderate gastritis and duodenitis, but biopsies benign. Next EGD in November 2015 for surveillance of Barrett's esophagus.  . ESOPHAGOGASTRODUODENOSCOPY (EGD) WITH PROPOFOL N/A 04/29/2015   SLF: 1. Barretts esophagus 2. Moderate non-erosive gastritis.   Marland Kitchen ESOPHAGOGASTRODUODENOSCOPY (EGD) WITH PROPOFOL N/A 08/01/2018   Barrett's, repeat in 5 years. Empiric dilatation, mild gastritis  . HERNIA REPAIR     ventral hernia repair  . LEFT HEART CATHETERIZATION WITH CORONARY/GRAFT ANGIOGRAM N/A 12/29/2012   Procedure: LEFT HEART CATHETERIZATION WITH Beatrix Fetters;  Surgeon: Troy Sine, MD;  Location: [facility] CATH LAB;  Service: Cardiovascular;  Laterality: N/A;  . ORIF FIBULA FRACTURE Right 06/03/2015   Procedure: OPEN  REDUCTION INTERNAL FIXATION (ORIF) DISTAL FIBULA  FRACTURE;  Surgeon: Garald Balding, MD;  Location: [facility];  Service: Orthopedics;  Laterality: Right;  . PERCUTANEOUS CORONARY STENT INTERVENTION (PCI-S)  12/29/2012   Procedure: PERCUTANEOUS CORONARY STENT INTERVENTION (PCI-S);  Surgeon: Troy Sine, MD;  Location: [facility] CATH LAB;  Service: Cardiovascular;;  . POLYPECTOMY  09/27/2017   Procedure: POLYPECTOMY;  Surgeon: Danie Binder, MD;  [facility];  Service:  Endoscopy;;  cecal polyp, ascending polyps x6, transverse colon polyps x6, splenic flexure polyps x2, descending colon polyps x6, sigmoid  colon polyp x1, rectal polyp x1   . SAVORY DILATION  09/06/2011   Procedure: SAVORY DILATION;  Surgeon: Dorothyann Peng, MD;  Location: [facility];  Service: Endoscopy;  Laterality: N/A;  Dilated with 34m  . SAVORY DILATION N/A 08/01/2018   Procedure: SAVORY DILATION;  Surgeon: FDanie Binder MD;  [facility];  Service: Endoscopy;  Laterality: N/A;   Social History   Socioeconomic History  . Marital status: Divorced    Spouse name: Not on file  . Number of children: Not on file  . Years of education: Not on file  . Highest education level: Not on file  Occupational History  . Not on file  Tobacco Use  . Smoking status: Current Every Day Smoker    Packs/day: 0.50    Years: 30.00    Pack years: 15.00    Types: Cigarettes  . Smokeless tobacco: Never Used  Substance and Sexual Activity  . Alcohol use: Yes    Comment: occ beer  . Drug use: No  . Sexual activity: Never  Other Topics Concern  . Not on file  Social History Narrative  . Not on file   Social Determinants of Health   Financial Resource Strain:   . Difficulty of Paying Living Expenses:   Food Insecurity:   . Worried About RCharity fundraiserin the Last Year:   . RArboriculturistin the Last Year:   Transportation Needs:   . LFilm/video editor(Medical):   .Marland KitchenLack of Transportation (Non-Medical):   Physical Activity:   . Days of Exercise per Week:   . Minutes of Exercise per Session:   Stress:   . Feeling of Stress :   Social Connections:   . Frequency of Communication with Friends and Family:   . Frequency of Social Gatherings with Friends and Family:   . Attends Religious Services:   . Active Member of Clubs or Organizations:   . Attends CArchivistMeetings:   .Marland KitchenMarital Status:    Outpatient Encounter Medications as of 03/04/2020  Medication Sig   . apixaban (ELIQUIS) 5 MG TABS tablet Take 1 tablet (5 mg total) by mouth 2 (two) times daily.  .Marland KitchenglipiZIDE (GLUCOTROL XL) 5 MG 24 hr tablet Take 2 tablets (10 mg total) by mouth daily with breakfast.  . rosuvastatin (CRESTOR) 20 MG tablet Take 20 mg by mouth daily.   . sitaGLIPtin (JANUVIA) 50 MG tablet Take 1 tablet (50 mg total) by mouth daily.  . Vitamin D, Ergocalciferol, (DRISDOL) 1.25 MG (50000 UNIT) CAPS capsule Take 1 capsule (50,000 Units total) by mouth every 7 (seven) days.  . [DISCONTINUED] Insulin Isophane & Regular Human (HUMULIN 70/30 MIX) (70-30) 100 UNIT/ML PEN Inject 80 Units into the skin 2 (two) times daily with a meal. 80 units with breakfast, 80 units with supper when BG above 90  . albuterol (VENTOLIN  HFA) 108 (90 Base) MCG/ACT inhaler Inhale 2 puffs into the lungs every 6 (six) hours as needed for wheezing or shortness of breath.  . blood glucose meter kit and supplies Dispense based on patient and insurance preference. Use up to two times daily as directed. (FOR ICD-E11.65)  . diazepam (VALIUM) 5 MG tablet Take 5 mg by mouth 2 (two) times daily.  Marland Kitchen escitalopram (LEXAPRO) 20 MG tablet Take 20 mg daily by mouth.   . fenofibrate micronized (LOFIBRA) 134 MG capsule Take 134 mg by mouth at bedtime.  . Fluticasone-Umeclidin-Vilant (TRELEGY ELLIPTA) 100-62.5-25 MCG/INH AEPB Inhale 1 puff daily into the lungs.   . furosemide (LASIX) 80 MG tablet Take 80 mg by mouth daily.  Marland Kitchen glucose blood test strip 1 each by Other route 4 (four) times daily. Use as instructed to check blood glucose four times daily  . Insulin Pen Needle (PEN NEEDLES) 31G X 8 MM MISC 1 each by Does not apply route 2 (two) times daily. Use as directed two times daily.  . isosorbide mononitrate (IMDUR) 60 MG 24 hr tablet Take 1 tablet (60 mg total) by mouth 3 (three) times daily. Please make an appointment for additional refills (Patient taking differently: Take 60 mg by mouth 2 (two) times daily. )  . lisinopril  (PRINIVIL,ZESTRIL) 5 MG tablet Take 5 mg by mouth daily.  . meclizine (ANTIVERT) 25 MG tablet Take 25 mg by mouth 2 (two) times daily.   . metoprolol tartrate (LOPRESSOR) 100 MG tablet Take 1 tablet (100 mg total) by mouth 2 (two) times daily. OFFICE VISIT NEEDED BEFORE ADDITIONAL REFILLS  . modafinil (PROVIGIL) 200 MG tablet Take 200 mg by mouth daily.  . nitroGLYCERIN (NITROLINGUAL) 0.4 MG/SPRAY spray Place 1 spray under the tongue every 5 (five) minutes x 3 doses as needed for chest pain.  Glory Rosebush Delica Lancets 73X MISC 1 each by Does not apply route 4 (four) times daily.  . pantoprazole (PROTONIX) 40 MG tablet Take 1 tablet (40 mg total) by mouth 2 (two) times daily before a meal.  . potassium chloride SA (K-DUR,KLOR-CON) 20 MEQ tablet Take 1 tablet (20 mEq total) by mouth 2 (two) times daily.  . [DISCONTINUED] Fluticasone-Umeclidin-Vilant (TRELEGY ELLIPTA) 100-62.5-25 MCG/INH AEPB Inhale 1 puff into the lungs daily.  . [DISCONTINUED] insulin regular human CONCENTRATED (HUMULIN R U-500 KWIKPEN) 500 UNIT/ML kwikpen Inject 50 Units into the skin 3 (three) times daily with meals. Only when blood glucose is above 90 and eating.   No facility-administered encounter medications on file as of 03/04/2020.    ALLERGIES: Allergies  Allergen Reactions  . Contrast Media [Iodinated Diagnostic Agents] Other (See Comments)    Pt must be premedicated before given contrast media - stops heart  . Iohexol Other (See Comments)     Consult with radiologist before pre meds are given.Desc: PT. STATES HEART STOPPED HAS TO BE PREMED.     VACCINATION STATUS:  There is no immunization history on file for this patient.  Diabetes He presents for his follow-up diabetic visit. He has type 2 diabetes mellitus. Onset time: He was diagnosed at approximate age of 77 years. His disease course has been worsening. There are no hypoglycemic associated symptoms. Pertinent negatives for hypoglycemia include no confusion,  headaches, pallor or seizures. Associated symptoms include polydipsia and polyuria. Pertinent negatives for diabetes include no blurred vision, no chest pain, no fatigue, no polyphagia and no weakness. There are no hypoglycemic complications. Symptoms are worsening. Diabetic complications include a CVA and  heart disease. Risk factors for coronary artery disease include diabetes mellitus, dyslipidemia, hypertension, male sex, obesity, tobacco exposure, sedentary lifestyle and family history. Current diabetic treatments: He was supposed to be on basal insulin, unfortunately he ran out and could not afford refills and he did not call clinic for alternatives. He is compliant with treatment none of the time. His weight is fluctuating minimally. He is following a generally unhealthy diet. When asked about meal planning, he reported none. He has not had a previous visit with a dietitian. He never participates in exercise. His home blood glucose trend is increasing steadily. His breakfast blood glucose range is generally >200 mg/dl. His dinner blood glucose range is generally >200 mg/dl. His overall blood glucose range is >200 mg/dl. (He presents with persistent hyperglycemia despite 80 units of Humulin 70/30 twice a day.  His recent point-of-care A1c was 9.7% increasing from 8.5%.) An ACE inhibitor/angiotensin II receptor blocker is being taken. He does not see a podiatrist.Eye exam is not current.  Hyperlipidemia This is a chronic problem. The current episode started more than 1 year ago. The problem is uncontrolled. Recent lipid tests were reviewed and are high. Exacerbating diseases include diabetes and obesity. Pertinent negatives include no chest pain, myalgias or shortness of breath. Current antihyperlipidemic treatment includes statins. Compliance problems include medication cost and psychosocial issues.  Risk factors for coronary artery disease include dyslipidemia, diabetes mellitus, hypertension, male sex,  obesity, family history and a sedentary lifestyle.  Hypertension This is a chronic problem. The current episode started more than 1 year ago. The problem is uncontrolled. Pertinent negatives include no blurred vision, chest pain, headaches, neck pain, palpitations or shortness of breath. Risk factors for coronary artery disease include diabetes mellitus, dyslipidemia, male gender, obesity, family history, sedentary lifestyle and smoking/tobacco exposure. Past treatments include ACE inhibitors. Hypertensive end-organ damage includes CVA.     Review of systems  Constitutional: + Minimally fluctuating body weight,  current  Body mass index is 43.19 kg/m. , no fatigue, no subjective hyperthermia, no subjective hypothermia Eyes: no blurry vision, no xerophthalmia ENT: no sore throat, no nodules palpated in throat, no dysphagia/odynophagia, no hoarseness Cardiovascular: no Chest Pain, no Shortness of Breath, no palpitations, no leg swelling Respiratory: + cough, + exertional shortness of breath related to COPD. Gastrointestinal: no Nausea/Vomiting/Diarhhea Musculoskeletal: no muscle/joint aches Skin: no rashes, no hyperemia Neurological: no tremors, no numbness, no tingling, no dizziness Psychiatric: no depression, no anxiety   Objective:    BP (!) 145/84   Pulse 90   Ht 5' 10" (1.778 m)   Wt (!) 301 lb (136.5 kg)   BMI 43.19 kg/m   Wt Readings from Last 3 Encounters:  03/04/20 (!) 301 lb (136.5 kg)  02/26/20 (!) 304 lb (137.9 kg)  02/11/20 (!) 302 lb (137 kg)      Physical Exam- Limited  Constitutional:  Body mass index is 43.19 kg/m. , not in acute distress, normal state of mind, + poor hygiene. Eyes:  EOMI, no exophthalmos Neck: Supple Thyroid: No gross goiter Respiratory: Adequate breathing efforts Musculoskeletal: no gross deformities, strength intact in all four extremities, no gross restriction of joint movements Skin:  no rashes, no hyperemia Neurological: no tremor  with outstretched hands,    CMP ( most recent) CMP     Component Value Date/Time   NA 139 10/11/2019 1141   K 4.4 10/11/2019 1141   CL 101 10/11/2019 1141   CO2 25 10/11/2019 1141   GLUCOSE 290 (H) 10/11/2019 1141  GLUCOSE 209 (H) 01/30/2019 1320   BUN 11 10/11/2019 1141   CREATININE 0.89 10/11/2019 1141   CALCIUM 9.8 10/11/2019 1141   PROT 6.5 10/11/2019 1141   ALBUMIN 4.1 10/11/2019 1141   AST 28 10/11/2019 1141   ALT 41 10/11/2019 1141   ALKPHOS 96 10/11/2019 1141   BILITOT 0.3 10/11/2019 1141   GFRNONAA 96 10/11/2019 1141   GFRAA 110 10/11/2019 1141   Diabetic Labs (most recent): Lab Results  Component Value Date   HGBA1C 9.7 02/11/2020   HGBA1C 8.5 (H) 11/07/2019   HGBA1C 9.2 (H) 07/03/2019     Lipid Panel ( most recent) Lipid Panel     Component Value Date/Time   CHOL 132 07/03/2019 1324   TRIG 315 (H) 07/03/2019 1324   HDL 30 (L) 07/03/2019 1324   CHOLHDL 6.1 04/28/2014 0121   VLDL 61 (H) 04/28/2014 0121   LDLCALC 53 07/03/2019 1324     Assessment & Plan:   1. DM type 2 causing vascular disease ([place])  - Patient has currently uncontrolled symptomatic type 2 DM since  59 years of age.  He presents with persistent hyperglycemia despite 80 units of Humulin 70/30 twice a day.  His recent point-of-care A1c was 9.7% increasing from 8.5%.  -his diabetes is complicated by coronary artery disease, CVA, obesity/sedentary life, chronic heavy smoking and BEARETT PORCARO remains at extremely high risk  for more acute and chronic complications which include CAD, CVA, CKD, retinopathy, and neuropathy. These are all discussed in detail with the patient.  - I have counseled him on diet management and weight loss, by adopting a carbohydrate restricted/protein rich diet.  - he  admits there is a room for improvement in his diet and drink choices. -  Suggestion is made for him to avoid simple carbohydrates  from his diet including Cakes, Sweet Desserts / Pastries, Ice  Cream, Soda (diet and regular), Sweet Tea, Candies, Chips, Cookies, Sweet Pastries,  Store Bought Juices, Alcohol in Excess of  1-2 drinks a day, Artificial Sweeteners, Coffee Creamer, and "Sugar-free" Products. This will help patient to have stable blood glucose profile and potentially avoid unintended weight gain.   - I encouraged him to switch to  unprocessed or minimally processed complex starch and increased protein intake (animal or plant source), fruits, and vegetables.  - he is advised to stick to a routine mealtimes to eat 3 meals  a day and avoid unnecessary snacks ( to snack only to correct hypoglycemia).   - I have approached him with the following individualized plan to manage diabetes and patient agrees:   - He has significant cognitive deficit, is having hard time controlling glycemic profile with his current insulin regimen.  He.  He does not have any more supplies of Humulin 70/30 in the house.   -I discussed and prescribed Humulin RU 500 50 units 3 times daily AC for Premeal blood glucose readings above 90 mg/dl, associated with strict monitoring of blood glucose 4 times a day-before meals and at bedtime. -He is advised to continue Januvia 50 mg p.o. daily with breakfast.   -He has benefited from glipizide treatment.  He is advised to continue glipizide 10 mg XL p.o. daily at breakfast.    -He does not tolerate metformin due to GI side effects.   -Patient is encouraged to call clinic for blood glucose levels less than 70 or above 300 mg /dl. -Incretin therapy will be administered with great caution in this patient with hypertriglyceridemia, and chronic  heavy smoking.   - Patient specific target  A1c;  LDL, HDL, Triglycerides,  were discussed in detail.  2) BP/HTN:   -His blood pressure is not controlled to target.  The patient continues to smoke heavily. The patient was counseled on the dangers of tobacco use, and was advised to quit.  Reviewed strategies to maximize success,  including removing cigarettes and smoking materials from environment.  -I discussed and increase his lisinopril to 10 mg daily.    3) Lipids/HPL: His  lipid panel is not controlled  with severe hypertriglyceridemia   of 315 improving from 469.  He is advised to continue Trilipix 135 mg p.o. nightly.    4)  Weight/Diet: His BMI is 11-HERDEYC complicating his diabetes care.  He is a candidate for modest weight loss.  CDE Consult has been  initiated , exercise, and detailed carbohydrates information provided.  5) vitamin D deficiency: He is on ongoing supplement with vitamin D2 50,000 units weekly for 12 weeks.  6) Chronic Care/Health Maintenance:  -he  is on ACEI/ARB and Statin medications and  is encouraged to continue to follow up with Ophthalmology, Dentist,  Podiatrist at least yearly or according to recommendations, and advised to  quit smoking. I have recommended yearly flu vaccine and pneumonia vaccination at least every 5 years; moderate intensity exercise for up to 150 minutes weekly; and  sleep for at least 7 hours a day.   - I advised patient to maintain close follow up with Sharilyn Sites, MD for primary care needs.  - Time spent on this patient care encounter:  35 min, of which > 50% was spent in  counseling and the rest reviewing his blood glucose logs , discussing his hypoglycemia and hyperglycemia episodes, reviewing his current and  previous labs / studies  ( including abstraction from other facilities) and medications  doses and developing a  long term treatment plan and documenting his care.   Please refer to Patient Instructions for Blood Glucose Monitoring and Insulin/Medications Dosing Guide"  in media tab for additional information. Please  also refer to " Patient Self Inventory" in the Media  tab for reviewed elements of pertinent patient history.  [name] participated in the discussions, expressed understanding, and voiced agreement with the above plans.  All  questions were answered to his satisfaction. he is encouraged to contact clinic should he have any questions or concerns prior to his return visit.    Follow up plan: - Return in about 3 weeks (around 03/25/2020) for Follow up with Meter and Logs Only - no Labs.   Glade Lloyd, MD Phone: [phone]  Fax: [phone]   03/04/2020, 12:04 PM   This note was partially dictated with voice recognition software. Similar sounding words can be transcribed inadequately or may not  be corrected upon review.

## 2020-03-04 NOTE — Telephone Encounter (Signed)
Paperwork faxed to [facility] on May 3rd. Nothing further needed at this time

## 2020-03-06 ENCOUNTER — Ambulatory Visit (HOSPITAL_COMMUNITY)
Admission: RE | Admit: 2020-03-06 | Discharge: 2020-03-06 | Disposition: A | Discharge status: Home / Self Care | Payer: Medicare HMO | Source: Ambulatory Visit | Attending: Pulmonary Disease | Admitting: Pulmonary Disease

## 2020-03-06 ENCOUNTER — Other Ambulatory Visit: Payer: Self-pay

## 2020-03-06 DIAGNOSIS — R911 Solitary pulmonary nodule: Secondary | ICD-10-CM

## 2020-03-06 DIAGNOSIS — R918 Other nonspecific abnormal finding of lung field: Secondary | ICD-10-CM | POA: Diagnosis not present

## 2020-03-12 DIAGNOSIS — I10 Essential (primary) hypertension: Secondary | ICD-10-CM | POA: Diagnosis not present

## 2020-03-12 DIAGNOSIS — I25810 Atherosclerosis of coronary artery bypass graft(s) without angina pectoris: Secondary | ICD-10-CM | POA: Diagnosis not present

## 2020-03-12 DIAGNOSIS — I480 Paroxysmal atrial fibrillation: Secondary | ICD-10-CM | POA: Diagnosis not present

## 2020-03-13 ENCOUNTER — Other Ambulatory Visit: Payer: Self-pay

## 2020-03-13 DIAGNOSIS — R911 Solitary pulmonary nodule: Secondary | ICD-10-CM

## 2020-03-17 ENCOUNTER — Other Ambulatory Visit (HOSPITAL_COMMUNITY)
Admission: RE | Admit: 2020-03-17 | Discharge: 2020-03-17 | Disposition: A | Discharge status: Home / Self Care | Payer: Medicare HMO | Source: Ambulatory Visit | Attending: Pulmonary Disease | Admitting: Pulmonary Disease

## 2020-03-17 ENCOUNTER — Other Ambulatory Visit: Payer: Self-pay

## 2020-03-17 DIAGNOSIS — Z20822 Contact with and (suspected) exposure to covid-19: Secondary | ICD-10-CM | POA: Insufficient documentation

## 2020-03-17 DIAGNOSIS — Z01812 Encounter for preprocedural laboratory examination: Secondary | ICD-10-CM | POA: Diagnosis not present

## 2020-03-17 LAB — SARS CORONAVIRUS 2 (TAT 6-24 HRS): SARS Coronavirus 2: NEGATIVE

## 2020-03-18 DIAGNOSIS — I10 Essential (primary) hypertension: Secondary | ICD-10-CM | POA: Diagnosis not present

## 2020-03-18 DIAGNOSIS — I25810 Atherosclerosis of coronary artery bypass graft(s) without angina pectoris: Secondary | ICD-10-CM | POA: Diagnosis not present

## 2020-03-19 ENCOUNTER — Ambulatory Visit: Payer: Medicare HMO | Admitting: Family Medicine

## 2020-03-20 ENCOUNTER — Ambulatory Visit (INDEPENDENT_AMBULATORY_CARE_PROVIDER_SITE_OTHER): Payer: Medicare HMO | Admitting: Pulmonary Disease

## 2020-03-20 ENCOUNTER — Other Ambulatory Visit: Payer: Self-pay

## 2020-03-20 DIAGNOSIS — J449 Chronic obstructive pulmonary disease, unspecified: Secondary | ICD-10-CM

## 2020-03-20 LAB — PULMONARY FUNCTION TEST
DL/VA % pred: 85 %
DL/VA: 3.66 ml/min/mmHg/L
DLCO cor % pred: 58 %
DLCO cor: 16.47 ml/min/mmHg
DLCO unc % pred: 58 %
DLCO unc: 16.47 ml/min/mmHg
FEF 25-75 Post: 1.56 L/sec
FEF 25-75 Pre: 1.09 L/sec
FEF2575-%Change-Post: 42 %
FEF2575-%Pred-Post: 49 %
FEF2575-%Pred-Pre: 35 %
FEV1-%Change-Post: 7 %
FEV1-%Pred-Post: 54 %
FEV1-%Pred-Pre: 51 %
FEV1-Post: 2.04 L
FEV1-Pre: 1.9 L
FEV1FVC-%Change-Post: 5 %
FEV1FVC-%Pred-Pre: 92 %
FEV6-%Change-Post: 1 %
FEV6-%Pred-Post: 58 %
FEV6-%Pred-Pre: 57 %
FEV6-Post: 2.7 L
FEV6-Pre: 2.66 L
FEV6FVC-%Change-Post: 1 %
FEV6FVC-%Pred-Post: 104 %
FEV6FVC-%Pred-Pre: 102 %
FVC-%Change-Post: 2 %
FVC-%Pred-Post: 56 %
FVC-%Pred-Pre: 55 %
FVC-Post: 2.75 L
FVC-Pre: 2.7 L
Post FEV1/FVC ratio: 74 %
Post FEV6/FVC ratio: 100 %
Pre FEV1/FVC ratio: 70 %
Pre FEV6/FVC Ratio: 99 %
RV % pred: 112 %
RV: 2.45 L
TLC % pred: 76 %
TLC: 5.35 L

## 2020-03-20 NOTE — Progress Notes (Signed)
Full PFT performed today. °

## 2020-03-24 ENCOUNTER — Telehealth: Payer: Self-pay | Admitting: "Endocrinology

## 2020-03-24 ENCOUNTER — Ambulatory Visit: Payer: Medicare HMO | Admitting: "Endocrinology

## 2020-03-24 DIAGNOSIS — I10 Essential (primary) hypertension: Secondary | ICD-10-CM | POA: Diagnosis not present

## 2020-03-24 DIAGNOSIS — Z79899 Other long term (current) drug therapy: Secondary | ICD-10-CM | POA: Diagnosis not present

## 2020-03-24 DIAGNOSIS — R931 Abnormal findings on diagnostic imaging of heart and coronary circulation: Secondary | ICD-10-CM | POA: Diagnosis not present

## 2020-03-24 DIAGNOSIS — I480 Paroxysmal atrial fibrillation: Secondary | ICD-10-CM | POA: Diagnosis not present

## 2020-03-24 DIAGNOSIS — Z7901 Long term (current) use of anticoagulants: Secondary | ICD-10-CM | POA: Diagnosis not present

## 2020-03-24 DIAGNOSIS — I2582 Chronic total occlusion of coronary artery: Secondary | ICD-10-CM | POA: Diagnosis not present

## 2020-03-24 DIAGNOSIS — G4733 Obstructive sleep apnea (adult) (pediatric): Secondary | ICD-10-CM | POA: Diagnosis not present

## 2020-03-24 DIAGNOSIS — Z951 Presence of aortocoronary bypass graft: Secondary | ICD-10-CM | POA: Diagnosis not present

## 2020-03-24 DIAGNOSIS — I25810 Atherosclerosis of coronary artery bypass graft(s) without angina pectoris: Secondary | ICD-10-CM | POA: Diagnosis not present

## 2020-03-24 DIAGNOSIS — J449 Chronic obstructive pulmonary disease, unspecified: Secondary | ICD-10-CM | POA: Diagnosis not present

## 2020-03-24 DIAGNOSIS — I2510 Atherosclerotic heart disease of native coronary artery without angina pectoris: Secondary | ICD-10-CM | POA: Diagnosis not present

## 2020-03-24 DIAGNOSIS — Z794 Long term (current) use of insulin: Secondary | ICD-10-CM | POA: Diagnosis not present

## 2020-03-24 NOTE — Telephone Encounter (Signed)
Holly with [facility] called to check the status of the patient assistance for his Januvia. She would like a call back @ [phone]

## 2020-03-24 NOTE — Telephone Encounter (Signed)
Left a message requesting a return call to the office. 

## 2020-03-25 ENCOUNTER — Encounter: Payer: Self-pay | Admitting: "Endocrinology

## 2020-03-25 ENCOUNTER — Ambulatory Visit (INDEPENDENT_AMBULATORY_CARE_PROVIDER_SITE_OTHER): Payer: Medicare HMO | Admitting: "Endocrinology

## 2020-03-25 ENCOUNTER — Other Ambulatory Visit: Payer: Self-pay

## 2020-03-25 VITALS — BP 125/84 | HR 94 | Ht 70.0 in | Wt 305.8 lb

## 2020-03-25 DIAGNOSIS — E559 Vitamin D deficiency, unspecified: Secondary | ICD-10-CM | POA: Diagnosis not present

## 2020-03-25 DIAGNOSIS — I10 Essential (primary) hypertension: Secondary | ICD-10-CM

## 2020-03-25 DIAGNOSIS — E782 Mixed hyperlipidemia: Secondary | ICD-10-CM | POA: Diagnosis not present

## 2020-03-25 DIAGNOSIS — E1159 Type 2 diabetes mellitus with other circulatory complications: Secondary | ICD-10-CM

## 2020-03-25 NOTE — Patient Instructions (Signed)

## 2020-03-25 NOTE — Progress Notes (Signed)
03/25/2020  Endocrinology follow-up note   Subjective:    Patient ID: [name], male    DOB: [birth year].  [name] is being seen in follow-up  for management of currently uncontrolled symptomatic type 2 diabetes, hypertension, hyperlipidemia, obesity. PMD:  Sharilyn Sites, MD.   Past Medical History:  Diagnosis Date  . Abnormal myocardial perfusion study 01/01/2011   there a small to moderate sized inferobasal scar  . Barrett's esophagus   . Cataracts, bilateral   . Chronic kidney disease    hx of kidney stones  . Claudication ([place]) 11/16/2011   PV test perform shows normal  . COPD (chronic obstructive pulmonary disease) ([place])   . Depression   . Diabetes ([place])    type 2 diabetes mellitus  . Dysrhythmia   . GERD (gastroesophageal reflux disease)   . Glaucoma   . Hernia of abdominal wall   . HTN (hypertension)   . Hyperlipidemia   . Morbid obesity ([place])   . Myocardial infarction ([place]) 2008,2009,2009  . OSA (obstructive sleep apnea)    on cpap  . PAF (paroxysmal atrial fibrillation) ([place])   . S/P colonoscopy 2009   3-4 mm transverse colon erosions likely secondary to  ASA  . S/P endoscopy Dec 2011   moderate erosive gastritis, Barrett's esophagus 1-2cm  . Sleep apnea   . SOB (shortness of breath) 11/03/2007   2D Echo EF 50%-55%  . Stroke [facility])    Past Surgical History:  Procedure Laterality Date  . BIOPSY N/A 04/29/2015   Procedure: BIOPSY;  Surgeon: Danie Binder, MD;  Location: [facility] ORS;  Service: Endoscopy;  Laterality: N/A;  . BIOPSY  08/01/2018   Procedure: BIOPSY;  Surgeon: Danie Binder, MD;  Location: [facility] END[facility] SU[facility];  Service: Endoscopy;;  esophageal  . CABG X 4  03/2008  . CARDIAC CATHETERIZATION  2009   stent placement to the left circumflex a 2.25   . CARDIAC CATHETERIZATION  07/08/2010  . COLONOSCOPY WITH PROPOFOL N/A 09/27/2017   normal ileum, twenty 4 to 8 mm polyps in the sigmoid colon, descending colon, splenic flexure, transverse colon,  ascending colon, cecum.  An additional three 2 to 4 mm polyps in the rectum and the descending colon.  Diverticulosis, internal hemorrhoids.  Surgical pathology found the polyps to be one fragment of hyperplastic polyp and 22 fragments of tubular adenoma..  Recommended repeat colonoscop  . CORONARY ARTERY BYPASS GRAFT  2008   4 vessels  . CORONARY STENT PLACEMENT  12/29/12  . CORONARY STENT PLACEMENT  12/2012  . ESOPHAGEAL DILATION N/A 04/29/2015   Procedure: ESOPHAGEAL DILATION 15 mm, 16 mm;  Surgeon: Danie Binder, MD;  Location: [facility];  Service: Endoscopy;  Laterality: N/A;  . ESOPHAGOGASTRODUODENOSCOPY  09/2011   Barrett's esophagus, no dysplasia on biopsies. Distal esophagitis. Status post dilation. Moderate gastritis and duodenitis, but biopsies benign. Next EGD in November 2015 for surveillance of Barrett's esophagus.  . ESOPHAGOGASTRODUODENOSCOPY (EGD) WITH PROPOFOL N/A 04/29/2015   SLF: 1. Barretts esophagus 2. Moderate non-erosive gastritis.   Marland Kitchen ESOPHAGOGASTRODUODENOSCOPY (EGD) WITH PROPOFOL N/A 08/01/2018   Barrett's, repeat in 5 years. Empiric dilatation, mild gastritis  . HERNIA REPAIR     ventral hernia repair  . LEFT HEART CATHETERIZATION WITH CORONARY/GRAFT ANGIOGRAM N/A 12/29/2012   Procedure: LEFT HEART CATHETERIZATION WITH Beatrix Fetters;  Surgeon: Troy Sine, MD;  Location: [facility] CATH LAB;  Service: Cardiovascular;  Laterality: N/A;  . ORIF FIBULA FRACTURE Right 06/03/2015   Procedure: OPEN REDUCTION INTERNAL  FIXATION (ORIF) DISTAL FIBULA  FRACTURE;  Surgeon: Garald Balding, MD;  Location: [place];  Service: Orthopedics;  Laterality: Right;  . PERCUTANEOUS CORONARY STENT INTERVENTION (PCI-S)  12/29/2012   Procedure: PERCUTANEOUS CORONARY STENT INTERVENTION (PCI-S);  Surgeon: Troy Sine, MD;  Location[facility] CATH LAB;  Service: Cardiovascular;;  . POLYPECTOMY  09/27/2017   Procedure: POLYPECTOMY;  Surgeon: Danie Binder, MD;  [facility];  Service:  Endoscopy;;  cecal polyp, ascending polyps x6, transverse colon polyps x6, splenic flexure polyps x2, descending colon polyps x6, sigmoid  colon polyp x1, rectal polyp x1   . SAVORY DILATION  09/06/2011   Procedure: SAVORY DILATION;  Surgeon: Dorothyann Peng, MD;  Location: [facility];  Service: Endoscopy;  Laterality: N/A;  Dilated with 72m  . SAVORY DILATION N/A 08/01/2018   Procedure: SAVORY DILATION;  Surgeon: FDanie Binder MD;  [facility];  Service: Endoscopy;  Laterality: N/A;   Social History   Socioeconomic History  . Marital status: Divorced    Spouse name: Not on file  . Number of children: Not on file  . Years of education: Not on file  . Highest education level: Not on file  Occupational History  . Not on file  Tobacco Use  . Smoking status: Current Every Day Smoker    Packs/day: 0.50    Years: 30.00    Pack years: 15.00    Types: Cigarettes  . Smokeless tobacco: Never Used  Substance and Sexual Activity  . Alcohol use: Yes    Comment: occ beer  . Drug use: No  . Sexual activity: Never  Other Topics Concern  . Not on file  Social History Narrative  . Not on file   Social Determinants of Health   Financial Resource Strain:   . Difficulty of Paying Living Expenses:   Food Insecurity:   . Worried About RCharity fundraiserin the Last Year:   . RArboriculturistin the Last Year:   Transportation Needs:   . LFilm/video editor(Medical):   .Marland KitchenLack of Transportation (Non-Medical):   Physical Activity:   . Days of Exercise per Week:   . Minutes of Exercise per Session:   Stress:   . Feeling of Stress :   Social Connections:   . Frequency of Communication with Friends and Family:   . Frequency of Social Gatherings with Friends and Family:   . Attends Religious Services:   . Active Member of Clubs or Organizations:   . Attends CArchivistMeetings:   .Marland KitchenMarital Status:    Outpatient Encounter Medications as of 03/25/2020  Medication Sig   . albuterol (VENTOLIN HFA) 108 (90 Base) MCG/ACT inhaler Inhale 2 puffs into the lungs every 6 (six) hours as needed for wheezing or shortness of breath.  .Marland Kitchenapixaban (ELIQUIS) 5 MG TABS tablet Take 1 tablet (5 mg total) by mouth 2 (two) times daily.  . blood glucose meter kit and supplies Dispense based on patient and insurance preference. Use up to two times daily as directed. (FOR ICD-E11.65)  . diazepam (VALIUM) 5 MG tablet Take 5 mg by mouth 2 (two) times daily.  .Marland Kitchenescitalopram (LEXAPRO) 20 MG tablet Take 20 mg daily by mouth.   . fenofibrate micronized (LOFIBRA) 134 MG capsule Take 134 mg by mouth at bedtime.  . Fluticasone-Umeclidin-Vilant (TRELEGY ELLIPTA) 100-62.5-25 MCG/INH AEPB Inhale 1 puff daily into the lungs.   . furosemide (LASIX) 80 MG tablet  Take 80 mg by mouth daily.  Marland Kitchen glipiZIDE (GLUCOTROL XL) 5 MG 24 hr tablet Take 2 tablets (10 mg total) by mouth daily with breakfast.  . glucose blood test strip 1 each by Other route 4 (four) times daily. Use as instructed to check blood glucose four times daily  . Insulin Pen Needle (PEN NEEDLES) 31G X 8 MM MISC 1 each by Does not apply route 2 (two) times daily. Use as directed two times daily.  . insulin regular human CONCENTRATED (HUMULIN R U-500 KWIKPEN) 500 UNIT/ML kwikpen Inject 50 Units into the skin 3 (three) times daily with meals. Only when blood glucose is above 90 and eating.  . isosorbide mononitrate (IMDUR) 60 MG 24 hr tablet Take 1 tablet (60 mg total) by mouth 3 (three) times daily. Please make an appointment for additional refills (Patient taking differently: Take 60 mg by mouth 2 (two) times daily. )  . lisinopril (ZESTRIL) 10 MG tablet Take 1 tablet (10 mg total) by mouth daily.  . meclizine (ANTIVERT) 25 MG tablet Take 25 mg by mouth 2 (two) times daily.   . metoprolol tartrate (LOPRESSOR) 100 MG tablet Take 1 tablet (100 mg total) by mouth 2 (two) times daily. OFFICE VISIT NEEDED BEFORE ADDITIONAL REFILLS  . modafinil  (PROVIGIL) 200 MG tablet Take 200 mg by mouth daily.  . nitroGLYCERIN (NITROLINGUAL) 0.4 MG/SPRAY spray Place 1 spray under the tongue every 5 (five) minutes x 3 doses as needed for chest pain.  [name] MISC 1 each by Does not apply route 4 (four) times daily.  . pantoprazole (PROTONIX) 40 MG tablet Take 1 tablet (40 mg total) by mouth 2 (two) times daily before a meal.  . potassium chloride SA (K-DUR,KLOR-CON) 20 MEQ tablet Take 1 tablet (20 mEq total) by mouth 2 (two) times daily.  . rosuvastatin (CRESTOR) 20 MG tablet Take 20 mg by mouth daily.   . sitaGLIPtin (JANUVIA) 50 MG tablet Take 1 tablet (50 mg total) by mouth daily.  . Vitamin D, Ergocalciferol, (DRISDOL) 1.25 MG (50000 UNIT) CAPS capsule Take 1 capsule (50,000 Units total) by mouth every 7 (seven) days.   No facility-administered encounter medications on file as of 03/25/2020.    ALLERGIES: Allergies  Allergen Reactions  . Contrast Media [Iodinated Diagnostic Agents] Other (See Comments)    Pt must be premedicated before given contrast media - stops heart  . Iohexol Other (See Comments)     Consult with radiologist before pre meds are given.Desc: PT. STATES HEART STOPPED HAS TO BE PREMED.     VACCINATION STATUS:  There is no immunization history on file for this patient.  Diabetes [name] presents for his follow-up diabetic visit. [name] has type 2 diabetes mellitus. Onset time: [name] was diagnosed at approximate age of 76 years. His disease course has been improving. There are no hypoglycemic associated symptoms. Pertinent negatives for hypoglycemia include no confusion, headaches, pallor or seizures. Associated symptoms include polydipsia and polyuria. Pertinent negatives for diabetes include no blurred vision, no chest pain, no fatigue, no polyphagia and no weakness. There are no hypoglycemic complications. Symptoms are improving. Diabetic complications include a CVA and heart disease. Risk factors for coronary  artery disease include diabetes mellitus, dyslipidemia, hypertension, male sex, obesity, tobacco exposure, sedentary lifestyle and family history. Current diabetic treatments: [name] was supposed to be on basal insulin, unfortunately [name] ran out and could not afford refills and [name] did not call clinic for alternatives. [name] is compliant with treatment  none of the time. His weight is fluctuating minimally. [name] is following a generally unhealthy diet. When asked about meal planning, [name] reported none. [name] has not had a previous visit with a dietitian. [name] never participates in exercise. His home blood glucose trend is decreasing steadily. His breakfast blood glucose range is generally 180-200 mg/dl. His lunch blood glucose range is generally 180-200 mg/dl. His dinner blood glucose range is generally 180-200 mg/dl. His overall blood glucose range is 180-200 mg/dl. ([name] presents with improved glycemic profile on insulin U500.  No hypoglycemia.  His recent point-of-care A1c was 9.7%.   ) An ACE inhibitor/angiotensin II receptor blocker is being taken. [name] does not see a podiatrist.Eye exam is not current.  Hyperlipidemia This is a chronic problem. The current episode started more than 1 year ago. The problem is uncontrolled. Recent lipid tests were reviewed and are high. Exacerbating diseases include diabetes and obesity. Pertinent negatives include no chest pain, myalgias or shortness of breath. Current antihyperlipidemic treatment includes statins. Compliance problems include medication cost and psychosocial issues.  Risk factors for coronary artery disease include dyslipidemia, diabetes mellitus, hypertension, male sex, obesity, family history and a sedentary lifestyle.  Hypertension This is a chronic problem. The current episode started more than 1 year ago. The problem is uncontrolled. Pertinent negatives include no blurred vision, chest pain, headaches, neck pain, palpitations or shortness of breath. Risk factors for  coronary artery disease include diabetes mellitus, dyslipidemia, male gender, obesity, family history, sedentary lifestyle and smoking/tobacco exposure. Past treatments include ACE inhibitors. Hypertensive end-organ damage includes CVA.     Review of systems  Constitutional: + Minimally fluctuating body weight,  current  Body mass index is 43.88 kg/m. , no fatigue, no subjective hyperthermia, no subjective hypothermia Eyes: no blurry vision, no xerophthalmia ENT: no sore throat, no nodules palpated in throat, no dysphagia/odynophagia, no hoarseness Cardiovascular: no Chest Pain, no Shortness of Breath, no palpitations, no leg swelling Respiratory: + cough, + exertional shortness of breath related to COPD. Gastrointestinal: no Nausea/Vomiting/Diarhhea Musculoskeletal: no muscle/joint aches Skin: no rashes, no hyperemia Neurological: no tremors, no numbness, no tingling, no dizziness Psychiatric: no depression, no anxiety   Objective:    BP 125/84   Pulse 94   Ht '5\' 10"'  (1.778 m)   Wt (!) 305 lb 12.8 oz (138.7 kg)   BMI 43.88 kg/m   Wt Readings from Last 3 Encounters:  03/25/20 (!) 305 lb 12.8 oz (138.7 kg)  03/04/20 (!) 301 lb (136.5 kg)  02/26/20 (!) 304 lb (137.9 kg)      Physical Exam- Limited  Constitutional:  Body mass index is 43.88 kg/m. , not in acute distress, normal state of mind, + poor hygiene. Eyes:  EOMI, no exophthalmos Neck: Supple Thyroid: No gross goiter Respiratory: Adequate breathing efforts Musculoskeletal: no gross deformities, strength intact in all four extremities, no gross restriction of joint movements Skin:  no rashes, no hyperemia Neurological: no tremor with outstretched hands,    CMP ( most recent) CMP     Component Value Date/Time   NA 139 10/11/2019 1141   K 4.4 10/11/2019 1141   CL 101 10/11/2019 1141   CO2 25 10/11/2019 1141   GLUCOSE 290 (H) 10/11/2019 1141   GLUCOSE 209 (H) 01/30/2019 1320   BUN 11 10/11/2019 1141    CREATININE 0.89 10/11/2019 1141   CALCIUM 9.8 10/11/2019 1141   PROT 6.5 10/11/2019 1141   ALBUMIN 4.1 10/11/2019 1141   AST 28 10/11/2019 1141   ALT  41 10/11/2019 1141   ALKPHOS 96 10/11/2019 1141   BILITOT 0.3 10/11/2019 1141   GFRNONAA 96 10/11/2019 1141   GFRAA 110 10/11/2019 1141   Diabetic Labs (most recent): Lab Results  Component Value Date   HGBA1C 9.7 02/11/2020   HGBA1C 8.5 (H) 11/07/2019   HGBA1C 9.2 (H) 07/03/2019     Lipid Panel ( most recent) Lipid Panel     Component Value Date/Time   CHOL 132 07/03/2019 1324   TRIG 315 (H) 07/03/2019 1324   HDL 30 (L) 07/03/2019 1324   CHOLHDL 6.1 04/28/2014 0121   VLDL 61 (H) 04/28/2014 0121   LDLCALC 53 07/03/2019 1324     Assessment & Plan:   1. DM type 2 causing vascular disease ([place])  - Patient has currently uncontrolled symptomatic type 2 DM since  59 years of age.  [name] presents with improved glycemic profile on insulin U500.  No hypoglycemia.   His recent point-of-care A1c was 9.7% increasing from 8.5%.  -his diabetes is complicated by coronary artery disease, CVA, obesity/sedentary life, chronic heavy smoking and [place] remains at extremely high risk  for more acute and chronic complications which include CAD, CVA, CKD, retinopathy, and neuropathy. These are all discussed in detail with the patient.  - I have counseled him on diet management and weight loss, by adopting a carbohydrate restricted/protein rich diet.  - [name]  admits there is a room for improvement in his diet and drink choices. -  Suggestion is made for him to avoid simple carbohydrates  from his diet including Cakes, Sweet Desserts / Pastries, Ice Cream, Soda (diet and regular), Sweet Tea, Candies, Chips, Cookies, Sweet Pastries,  Store Bought Juices, Alcohol in Excess of  1-2 drinks a day, Artificial Sweeteners, Coffee Creamer, and "Sugar-free" Products. This will help patient to have stable blood glucose profile and potentially avoid  unintended weight gain.   - I encouraged him to switch to  unprocessed or minimally processed complex starch and increased protein intake (animal or plant source), fruits, and vegetables.  - [name] is advised to stick to a routine mealtimes to eat 3 meals  a day and avoid unnecessary snacks ( to snack only to correct hypoglycemia).   - I have approached him with the following individualized plan to manage diabetes and patient agrees:   - [name] has significant cognitive deficit, is having hard time controlling glycemic profile with his current insulin regimen.  [name] did better with 500 . -Priority is still to avoid hypoglycemia in this patient.    -[name] is advised to continue  Humulin RU 500 50 units 3 times daily AC for Premeal blood glucose readings above 90 mg/dl, associated with strict monitoring of blood glucose 4 times a day-before meals and at bedtime. -[name] is advised to continue Januvia 50 mg p.o. daily with breakfast.   -[name] has benefited from glipizide treatment.  [name] is advised to continue glipizide 10 mg XL p.o. daily at breakfast.    -[name] does not tolerate metformin due to GI side effects.   -Patient is encouraged to call clinic for blood glucose levels less than 70 or above 300 mg /dl. -Incretin therapy will be administered with great caution in this patient with hypertriglyceridemia, and chronic heavy smoking.   - Patient specific target  A1c;  LDL, HDL, Triglycerides,  were discussed in detail.  2) BP/HTN:   -His blood pressure is controlled to target.  The patient continues to smoke heavily. The patient was counseled  on the dangers of tobacco use, and was advised to quit.  Reviewed strategies to maximize success, including removing cigarettes and smoking materials from environment.  -I recently increase his lisinopril to 10 mg daily.      3) Lipids/HPL: His  lipid panel is not controlled  with severe hypertriglyceridemia   of 315 improving from 469.  [name] is advised to continue Trilipix  135 mg p.o. nightly.    4)  Weight/Diet: His BMI is 10-XNATFTD complicating his diabetes care.  [name] is a candidate for modest weight loss.  CDE Consult has been  initiated , exercise, and detailed carbohydrates information provided.  5) vitamin D deficiency: [name] is on ongoing supplement with vitamin D2 50,000 units weekly for 12 weeks.  6) Chronic Care/Health Maintenance:  -[name]  is on ACEI/ARB and Statin medications and  is encouraged to continue to follow up with Ophthalmology, Dentist,  Podiatrist at least yearly or according to recommendations, and advised to  quit smoking. I have recommended yearly flu vaccine and pneumonia vaccination at least every 5 years; moderate intensity exercise for up to 150 minutes weekly; and  sleep for at least 7 hours a day.   - I advised patient to maintain close follow up with Sharilyn Sites, MD for primary care needs.  - Time spent on this patient care encounter:  35 min, of which > 50% was spent in  counseling and the rest reviewing his blood glucose logs , discussing his hypoglycemia and hyperglycemia episodes, reviewing his current and  previous labs / studies  ( including abstraction from other facilities) and medications  doses and developing a  long term treatment plan and documenting his care.   Please refer to Patient Instructions for Blood Glucose Monitoring and Insulin/Medications Dosing Guide"  in media tab for additional information. Please  also refer to " Patient Self Inventory" in the Media  tab for reviewed elements of pertinent patient history.  [name] participated in the discussions, expressed understanding, and voiced agreement with the above plans.  All questions were answered to his satisfaction. [name] is encouraged to contact clinic should [name] have any questions or concerns prior to his return visit.   Follow up plan: - Return in about 3 months (around 06/25/2020) for F/U with Pre-visit Labs, Meter, Logs, A1c here., NV Office Urine MA,  NV A1c in Office.   Glade Lloyd, MD Phone: [phone]  Fax: [phone]   03/25/2020, 1:14 PM   This note was partially dictated with voice recognition software. Similar sounding words can be transcribed inadequately or may not  be corrected upon review.

## 2020-03-26 NOTE — Telephone Encounter (Signed)
Left a message requesting a return call to the office. 

## 2020-03-27 DIAGNOSIS — G4733 Obstructive sleep apnea (adult) (pediatric): Secondary | ICD-10-CM | POA: Diagnosis not present

## 2020-03-27 DIAGNOSIS — J449 Chronic obstructive pulmonary disease, unspecified: Secondary | ICD-10-CM | POA: Diagnosis not present

## 2020-03-31 DIAGNOSIS — I4891 Unspecified atrial fibrillation: Secondary | ICD-10-CM | POA: Diagnosis not present

## 2020-03-31 DIAGNOSIS — J449 Chronic obstructive pulmonary disease, unspecified: Secondary | ICD-10-CM | POA: Diagnosis not present

## 2020-03-31 DIAGNOSIS — I25700 Atherosclerosis of coronary artery bypass graft(s), unspecified, with unstable angina pectoris: Secondary | ICD-10-CM | POA: Diagnosis not present

## 2020-03-31 DIAGNOSIS — Z720 Tobacco use: Secondary | ICD-10-CM | POA: Diagnosis not present

## 2020-04-16 NOTE — Telephone Encounter (Signed)
Holly with [facility] called to check the status of this. I advised her that we tried to reach out to Legrand Como several times and were unable to reach him. She is going to re-do the application and have him sign it and will fax to [place].

## 2020-04-16 NOTE — Telephone Encounter (Signed)
Noted  

## 2020-04-23 ENCOUNTER — Telehealth: Payer: Self-pay | Admitting: Pulmonary Disease

## 2020-04-23 NOTE — Telephone Encounter (Signed)
Advised pt of results. Pt understood and nothing further is needed.   

## 2020-04-27 DIAGNOSIS — G4733 Obstructive sleep apnea (adult) (pediatric): Secondary | ICD-10-CM | POA: Diagnosis not present

## 2020-04-27 DIAGNOSIS — J449 Chronic obstructive pulmonary disease, unspecified: Secondary | ICD-10-CM | POA: Diagnosis not present

## 2020-04-29 DIAGNOSIS — I480 Paroxysmal atrial fibrillation: Secondary | ICD-10-CM | POA: Diagnosis not present

## 2020-04-29 DIAGNOSIS — I25708 Atherosclerosis of coronary artery bypass graft(s), unspecified, with other forms of angina pectoris: Secondary | ICD-10-CM | POA: Diagnosis not present

## 2020-04-29 DIAGNOSIS — I10 Essential (primary) hypertension: Secondary | ICD-10-CM | POA: Diagnosis not present

## 2020-04-30 DIAGNOSIS — J449 Chronic obstructive pulmonary disease, unspecified: Secondary | ICD-10-CM | POA: Diagnosis not present

## 2020-04-30 DIAGNOSIS — I4891 Unspecified atrial fibrillation: Secondary | ICD-10-CM | POA: Diagnosis not present

## 2020-04-30 DIAGNOSIS — Z720 Tobacco use: Secondary | ICD-10-CM | POA: Diagnosis not present

## 2020-04-30 DIAGNOSIS — I25700 Atherosclerosis of coronary artery bypass graft(s), unspecified, with unstable angina pectoris: Secondary | ICD-10-CM | POA: Diagnosis not present

## 2020-05-02 ENCOUNTER — Other Ambulatory Visit: Payer: Self-pay | Admitting: "Endocrinology

## 2020-05-02 DIAGNOSIS — R69 Illness, unspecified: Secondary | ICD-10-CM | POA: Diagnosis not present

## 2020-05-06 DIAGNOSIS — I25708 Atherosclerosis of coronary artery bypass graft(s), unspecified, with other forms of angina pectoris: Secondary | ICD-10-CM | POA: Diagnosis not present

## 2020-05-06 DIAGNOSIS — I480 Paroxysmal atrial fibrillation: Secondary | ICD-10-CM | POA: Diagnosis not present

## 2020-05-06 DIAGNOSIS — I10 Essential (primary) hypertension: Secondary | ICD-10-CM | POA: Diagnosis not present

## 2020-05-09 ENCOUNTER — Other Ambulatory Visit: Payer: Self-pay | Admitting: "Endocrinology

## 2020-05-09 DIAGNOSIS — E1159 Type 2 diabetes mellitus with other circulatory complications: Secondary | ICD-10-CM | POA: Diagnosis not present

## 2020-05-10 LAB — COMPREHENSIVE METABOLIC PANEL
ALT: 33 IU/L (ref 0–44)
AST: 22 IU/L (ref 0–40)
Albumin/Globulin Ratio: 1.9 (ref 1.2–2.2)
Albumin: 4.3 g/dL (ref 3.8–4.9)
Alkaline Phosphatase: 106 IU/L (ref 48–121)
BUN/Creatinine Ratio: 7 — ABNORMAL LOW (ref 9–20)
BUN: 7 mg/dL (ref 6–24)
Bilirubin Total: 0.3 mg/dL (ref 0.0–1.2)
CO2: 23 mmol/L (ref 20–29)
Calcium: 9.8 mg/dL (ref 8.7–10.2)
Chloride: 96 mmol/L (ref 96–106)
Creatinine, Ser: 0.96 mg/dL (ref 0.76–1.27)
GFR calc Af Amer: 101 mL/min/{1.73_m2} (ref >59)
GFR calc non Af Amer: 87 mL/min/{1.73_m2} (ref >59)
Globulin, Total: 2.3 g/dL (ref 1.5–4.5)
Glucose: 571 mg/dL (ref 65–99)
Potassium: 3.7 mmol/L (ref 3.5–5.2)
Sodium: 136 mmol/L (ref 134–144)
Total Protein: 6.6 g/dL (ref 6.0–8.5)

## 2020-05-12 ENCOUNTER — Telehealth: Payer: Self-pay | Admitting: "Endocrinology

## 2020-05-12 NOTE — Telephone Encounter (Signed)
Same plan, no change.

## 2020-05-12 NOTE — Telephone Encounter (Signed)
Pt returning call

## 2020-05-12 NOTE — Telephone Encounter (Signed)
Left message for patient to return call to see what his finger stick glucose reading is.

## 2020-05-12 NOTE — Telephone Encounter (Signed)
Patient returned call his bg at 140pm today was 352 7/11 pm bg 296, am425 7/10 pm 498

## 2020-05-12 NOTE — Telephone Encounter (Signed)
Santiago Glad from [facility] is calling with critical labs and is requesting a call back at [phone] opt 1. Reference # [id number]

## 2020-05-13 NOTE — Telephone Encounter (Signed)
Left pt voicemail to advise.

## 2020-05-26 DIAGNOSIS — J449 Chronic obstructive pulmonary disease, unspecified: Secondary | ICD-10-CM | POA: Diagnosis not present

## 2020-05-26 DIAGNOSIS — G4733 Obstructive sleep apnea (adult) (pediatric): Secondary | ICD-10-CM | POA: Diagnosis not present

## 2020-05-30 DIAGNOSIS — I480 Paroxysmal atrial fibrillation: Secondary | ICD-10-CM | POA: Diagnosis not present

## 2020-05-30 DIAGNOSIS — Z720 Tobacco use: Secondary | ICD-10-CM | POA: Diagnosis not present

## 2020-05-30 DIAGNOSIS — I25700 Atherosclerosis of coronary artery bypass graft(s), unspecified, with unstable angina pectoris: Secondary | ICD-10-CM | POA: Diagnosis not present

## 2020-05-30 DIAGNOSIS — J449 Chronic obstructive pulmonary disease, unspecified: Secondary | ICD-10-CM | POA: Diagnosis not present

## 2020-06-10 ENCOUNTER — Telehealth: Payer: Self-pay | Admitting: "Endocrinology

## 2020-06-10 DIAGNOSIS — Z794 Long term (current) use of insulin: Secondary | ICD-10-CM | POA: Diagnosis not present

## 2020-06-10 DIAGNOSIS — I10 Essential (primary) hypertension: Secondary | ICD-10-CM | POA: Diagnosis not present

## 2020-06-10 DIAGNOSIS — E1159 Type 2 diabetes mellitus with other circulatory complications: Secondary | ICD-10-CM | POA: Diagnosis not present

## 2020-06-10 DIAGNOSIS — I25708 Atherosclerosis of coronary artery bypass graft(s), unspecified, with other forms of angina pectoris: Secondary | ICD-10-CM | POA: Diagnosis not present

## 2020-06-10 NOTE — Telephone Encounter (Signed)
Returned call to pt, no answer, left message to return call

## 2020-06-10 NOTE — Telephone Encounter (Signed)
Pt requesting call back from nurse

## 2020-06-10 NOTE — Telephone Encounter (Signed)
Returned call again, no answer, left message.

## 2020-06-10 NOTE — Telephone Encounter (Signed)
Patient returning call.

## 2020-06-11 ENCOUNTER — Ambulatory Visit (HOSPITAL_COMMUNITY)
Admission: RE | Admit: 2020-06-11 | Discharge: 2020-06-11 | Disposition: A | Discharge status: Home / Self Care | Payer: Medicare HMO | Source: Ambulatory Visit | Attending: Pulmonary Disease | Admitting: Pulmonary Disease

## 2020-06-11 ENCOUNTER — Other Ambulatory Visit: Payer: Self-pay | Admitting: "Endocrinology

## 2020-06-11 ENCOUNTER — Other Ambulatory Visit: Payer: Self-pay

## 2020-06-11 DIAGNOSIS — R911 Solitary pulmonary nodule: Secondary | ICD-10-CM | POA: Insufficient documentation

## 2020-06-11 DIAGNOSIS — I10 Essential (primary) hypertension: Secondary | ICD-10-CM | POA: Diagnosis not present

## 2020-06-11 DIAGNOSIS — I700 Atherosclerosis of aorta: Secondary | ICD-10-CM | POA: Diagnosis not present

## 2020-06-11 DIAGNOSIS — S2241XA Multiple fractures of ribs, right side, initial encounter for closed fracture: Secondary | ICD-10-CM | POA: Diagnosis not present

## 2020-06-11 DIAGNOSIS — E1159 Type 2 diabetes mellitus with other circulatory complications: Secondary | ICD-10-CM | POA: Diagnosis not present

## 2020-06-11 DIAGNOSIS — Z794 Long term (current) use of insulin: Secondary | ICD-10-CM | POA: Diagnosis not present

## 2020-06-11 DIAGNOSIS — I25708 Atherosclerosis of coronary artery bypass graft(s), unspecified, with other forms of angina pectoris: Secondary | ICD-10-CM | POA: Diagnosis not present

## 2020-06-11 DIAGNOSIS — J438 Other emphysema: Secondary | ICD-10-CM | POA: Diagnosis not present

## 2020-06-11 DIAGNOSIS — J432 Centrilobular emphysema: Secondary | ICD-10-CM | POA: Diagnosis not present

## 2020-06-11 NOTE — Telephone Encounter (Signed)
Patient was having problems with his glucometer, advised to look at his troubleshooting manuel to see if that doesn't help him, if he continues to have problems he will notify office to get a new glucometer sent in.

## 2020-06-12 ENCOUNTER — Telehealth: Payer: Self-pay | Admitting: Pulmonary Disease

## 2020-06-12 DIAGNOSIS — R911 Solitary pulmonary nodule: Secondary | ICD-10-CM

## 2020-06-12 NOTE — Telephone Encounter (Signed)
Patient contacted with results of recent chest CT, notes from provider read to patient. Repeat CT scan ordered as requested. Patient verbalized understanding of results and follow up plan.

## 2020-06-26 ENCOUNTER — Encounter: Payer: Self-pay | Admitting: "Endocrinology

## 2020-06-26 ENCOUNTER — Ambulatory Visit (INDEPENDENT_AMBULATORY_CARE_PROVIDER_SITE_OTHER): Payer: Medicare HMO | Admitting: "Endocrinology

## 2020-06-26 ENCOUNTER — Other Ambulatory Visit: Payer: Self-pay

## 2020-06-26 VITALS — BP 110/74 | HR 80 | Ht 70.0 in | Wt 294.6 lb

## 2020-06-26 DIAGNOSIS — E782 Mixed hyperlipidemia: Secondary | ICD-10-CM

## 2020-06-26 DIAGNOSIS — E1159 Type 2 diabetes mellitus with other circulatory complications: Secondary | ICD-10-CM

## 2020-06-26 DIAGNOSIS — E559 Vitamin D deficiency, unspecified: Secondary | ICD-10-CM | POA: Diagnosis not present

## 2020-06-26 DIAGNOSIS — R69 Illness, unspecified: Secondary | ICD-10-CM | POA: Diagnosis not present

## 2020-06-26 DIAGNOSIS — G4733 Obstructive sleep apnea (adult) (pediatric): Secondary | ICD-10-CM | POA: Diagnosis not present

## 2020-06-26 DIAGNOSIS — J449 Chronic obstructive pulmonary disease, unspecified: Secondary | ICD-10-CM | POA: Diagnosis not present

## 2020-06-26 DIAGNOSIS — F17200 Nicotine dependence, unspecified, uncomplicated: Secondary | ICD-10-CM

## 2020-06-26 DIAGNOSIS — I10 Essential (primary) hypertension: Secondary | ICD-10-CM

## 2020-06-26 LAB — POCT GLYCOSYLATED HEMOGLOBIN (HGB A1C): Hemoglobin A1C: 11 % — AB (ref 4.0–5.6)

## 2020-06-26 LAB — POCT UA - MICROALBUMIN
Albumin/Creatinine Ratio, Urine, POC: <30
Creatinine, POC: 300 mg/dL
Microalbumin Ur, POC: 10 mg/L

## 2020-06-26 MED ORDER — HUMULIN R U-500 KWIKPEN 500 UNIT/ML ~~LOC~~ SOPN: 60.0000 [IU] | PEN_INJECTOR | Freq: Three times a day (TID) | SUBCUTANEOUS | 2 refills | Status: DC

## 2020-06-26 MED ORDER — FREESTYLE LIBRE 2 SENSOR MISC: 1.0000 | 3 refills | Status: DC

## 2020-06-26 MED ORDER — FREESTYLE LIBRE 2 READER DEVI
0 refills | Status: DC
Start: 2020-06-26 — End: 2023-03-24

## 2020-06-26 NOTE — Progress Notes (Signed)
06/26/2020  Endocrinology follow-up note   Subjective:    Patient ID: [name], male    DOB: 10/12/1961.  he is being seen in follow-up  for management of currently uncontrolled symptomatic type 2 diabetes, hypertension, hyperlipidemia, obesity. PMD:  Sharilyn Sites, MD.   Past Medical History:  Diagnosis Date   Abnormal myocardial perfusion study 01/01/2011   there a small to moderate sized inferobasal scar   Barrett's esophagus    Cataracts, bilateral    Chronic kidney disease    hx of kidney stones   Claudication ([place]) 11/16/2011   PV test perform shows normal   COPD (chronic obstructive pulmonary disease) ([place])    Depression    Diabetes ([place])    type 2 diabetes mellitus   Dysrhythmia    GERD (gastroesophageal reflux disease)    Glaucoma    Hernia of abdominal wall    HTN (hypertension)    Hyperlipidemia    Morbid obesity ([place])    Myocardial infarction ([place]) 2008,2009,2009   OSA (obstructive sleep apnea)    on cpap   PAF (paroxysmal atrial fibrillation) ([place])    S/P colonoscopy 2009   3-4 mm transverse colon erosions likely secondary to  ASA   S/P endoscopy Dec 2011   moderate erosive gastritis, Barrett's esophagus 1-2cm   Sleep apnea    SOB (shortness of breath) 11/03/2007   2D Echo EF 50%-55%   Stroke [facility])    Past Surgical History:  Procedure Laterality Date   BIOPSY N/A 04/29/2015   Procedure: BIOPSY;  Surgeon: Danie Binder, MD;  Location: [facility] ORS;  Service: Endoscopy;  Laterality: N/A;   BIOPSY  08/01/2018   Procedure: BIOPSY;  Surgeon: Danie Binder, MD;  Location: [facility];  Service: Endoscopy;;  esophageal   CABG X 4  03/2008   CARDIAC CATHETERIZATION  2009   stent placement to the left circumflex a 2.25    CARDIAC CATHETERIZATION  07/08/2010   COLONOSCOPY WITH PROPOFOL N/A 09/27/2017   normal ileum, twenty 4 to 8 mm polyps in the sigmoid colon, descending colon, splenic flexure, transverse colon,  ascending colon, cecum.  An additional three 2 to 4 mm polyps in the rectum and the descending colon.  Diverticulosis, internal hemorrhoids.  Surgical pathology found the polyps to be one fragment of hyperplastic polyp and 22 fragments of tubular adenoma..  Recommended repeat colonoscop   CORONARY ARTERY BYPASS GRAFT  2008   4 vessels   CORONARY STENT PLACEMENT  12/29/12   CORONARY STENT PLACEMENT  12/2012   ESOPHAGEAL DILATION N/A 04/29/2015   Procedure: ESOPHAGEAL DILATION 15 mm, 16 mm;  Surgeon: Danie Binder, MD;  Location: [facility];  Service: Endoscopy;  Laterality: N/A;   ESOPHAGOGASTRODUODENOSCOPY  09/2011   Barrett's esophagus, no dysplasia on biopsies. Distal esophagitis. Status post dilation. Moderate gastritis and duodenitis, but biopsies benign. Next EGD in November 2015 for surveillance of Barrett's esophagus.   ESOPHAGOGASTRODUODENOSCOPY (EGD) WITH PROPOFOL N/A 04/29/2015   SLF: 1. Barretts esophagus 2. Moderate non-erosive gastritis.    ESOPHAGOGASTRODUODENOSCOPY (EGD) WITH PROPOFOL N/A 08/01/2018   Barrett's, repeat in 5 years. Empiric dilatation, mild gastritis   HERNIA REPAIR     ventral hernia repair   LEFT HEART CATHETERIZATION WITH CORONARY/GRAFT ANGIOGRAM N/A 12/29/2012   Procedure: LEFT HEART CATHETERIZATION WITH Beatrix Fetters;  Surgeon: Troy Sine, MD;  Location[facility] CATH LAB;  Service: Cardiovascular;  Laterality: N/A;   ORIF FIBULA FRACTURE Right 06/03/2015   Procedure: OPEN REDUCTION INTERNAL  FIXATION (ORIF) DISTAL FIBULA  FRACTURE;  Surgeon: Garald Balding, MD;  Location: [place];  Service: Orthopedics;  Laterality: Right;   PERCUTANEOUS CORONARY STENT INTERVENTION (PCI-S)  12/29/2012   Procedure: PERCUTANEOUS CORONARY STENT INTERVENTION (PCI-S);  Surgeon: Troy Sine, MD;  Location[facility] CATH LAB;  Service: Cardiovascular;;   POLYPECTOMY  09/27/2017   Procedure: POLYPECTOMY;  Surgeon: Danie Binder, MD;  [facility];  Service:  Endoscopy;;  cecal polyp, ascending polyps x6, transverse colon polyps x6, splenic flexure polyps x2, descending colon polyps x6, sigmoid  colon polyp x1, rectal polyp x1    SAVORY DILATION  09/06/2011   Procedure: SAVORY DILATION;  Surgeon: Dorothyann Peng, MD;  Location: [facility];  Service: Endoscopy;  Laterality: N/A;  Dilated with 7m   SAVORY DILATION N/A 08/01/2018   Procedure: SAVORY DILATION;  Surgeon: FDanie Binder MD;  [facility];  Service: Endoscopy;  Laterality: N/A;   Social History   Socioeconomic History   Marital status: Divorced    Spouse name: Not on file   Number of children: Not on file   Years of education: Not on file   Highest education level: Not on file  Occupational History   Not on file  Tobacco Use   Smoking status: Current Every Day Smoker    Packs/day: 0.50    Years: 30.00    Pack years: 15.00    Types: Cigarettes   Smokeless tobacco: Never Used  VScientific laboratory technicianUse: Never used  Substance and Sexual Activity   Alcohol use: Yes    Comment: occ beer   Drug use: No   Sexual activity: Never  Other Topics Concern   Not on file  Social History Narrative   Not on file   Social Determinants of Health   Financial Resource Strain:    Difficulty of Paying Living Expenses: Not on file  Food Insecurity:    Worried About RCharity fundraiserin the Last Year: Not on file   RYRC Worldwideof Food in the Last Year: Not on file  Transportation Needs:    Lack of Transportation (Medical): Not on file   Lack of Transportation (Non-Medical): Not on file  Physical Activity:    Days of Exercise per Week: Not on file   Minutes of Exercise per Session: Not on file  Stress:    Feeling of Stress : Not on file  Social Connections:    Frequency of Communication with Friends and Family: Not on file   Frequency of Social Gatherings with Friends and Family: Not on file   Attends Religious Services: Not on file   Active Member of  Clubs or Organizations: Not on file   Attends Club or Organization Meetings: Not on file   Marital Status: Not on file   Outpatient Encounter Medications as of 06/26/2020  Medication Sig   albuterol (VENTOLIN HFA) 108 (90 Base) MCG/ACT inhaler Inhale 2 puffs into the lungs every 6 (six) hours as needed for wheezing or shortness of breath.   amLODipine (NORVASC) 10 MG tablet Take 10 mg by mouth at bedtime.   apixaban (ELIQUIS) 5 MG TABS tablet Take 1 tablet (5 mg total) by mouth 2 (two) times daily.   blood glucose meter kit and supplies Dispense based on patient and insurance preference. Use up to two times daily as directed. (FOR ICD-E11.65)   Continuous Blood Gluc Receiver (FREESTYLE LIBRE 2 READER) DEVI As directed   Continuous Blood  Gluc Sensor (FREESTYLE LIBRE 2 SENSOR) MISC 1 Piece by Does not apply route every 14 (fourteen) days.   diazepam (VALIUM) 5 MG tablet Take 5 mg by mouth 2 (two) times daily.   escitalopram (LEXAPRO) 20 MG tablet Take 20 mg daily by mouth.    fenofibrate micronized (LOFIBRA) 134 MG capsule Take 134 mg by mouth at bedtime.   Fluticasone-Umeclidin-Vilant (TRELEGY ELLIPTA) 100-62.5-25 MCG/INH AEPB Inhale 1 puff daily into the lungs.    furosemide (LASIX) 80 MG tablet Take 80 mg by mouth daily.   glipiZIDE (GLUCOTROL XL) 5 MG 24 hr tablet TAKE TWO TABLETS BY MOUTH EVERY MORNING WITH BREAKFAST   Insulin Pen Needle (PEN NEEDLES) 31G X 8 MM MISC 1 each by Does not apply route 2 (two) times daily. Use as directed two times daily.   insulin regular human CONCENTRATED (HUMULIN R U-500 KWIKPEN) 500 UNIT/ML kwikpen Inject 60 Units into the skin 3 (three) times daily with meals. Only when blood glucose is above 90 and eating.   isosorbide mononitrate (IMDUR) 60 MG 24 hr tablet Take 1 tablet (60 mg total) by mouth 3 (three) times daily. Please make an appointment for additional refills (Patient taking differently: Take 60 mg by mouth 2 (two) times daily. )    Lancets (ONETOUCH DELICA PLUS [id number]) MISC USE TO check blood sugar FOUR TIMES DAILY AS DIRECTED   lisinopril (ZESTRIL) 10 MG tablet Take 1 tablet (10 mg total) by mouth daily.   meclizine (ANTIVERT) 25 MG tablet Take 25 mg by mouth 2 (two) times daily.    metoprolol tartrate (LOPRESSOR) 100 MG tablet Take 1 tablet (100 mg total) by mouth 2 (two) times daily. OFFICE VISIT NEEDED BEFORE ADDITIONAL REFILLS   modafinil (PROVIGIL) 200 MG tablet Take 200 mg by mouth daily.   nitroGLYCERIN (NITROLINGUAL) 0.4 MG/SPRAY spray Place 1 spray under the tongue every 5 (five) minutes x 3 doses as needed for chest pain.   ONETOUCH VERIO test strip USE TO check blood sugar FOUR TIMES DAILY AS DIRECTED   pantoprazole (PROTONIX) 40 MG tablet Take 1 tablet (40 mg total) by mouth 2 (two) times daily before a meal.   potassium chloride SA (K-DUR,KLOR-CON) 20 MEQ tablet Take 1 tablet (20 mEq total) by mouth 2 (two) times daily.   rosuvastatin (CRESTOR) 20 MG tablet Take 20 mg by mouth daily.    sitaGLIPtin (JANUVIA) 50 MG tablet Take 1 tablet (50 mg total) by mouth daily.   Vitamin D, Ergocalciferol, (DRISDOL) 1.25 MG (50000 UNIT) CAPS capsule Take 1 capsule (50,000 Units total) by mouth every 7 (seven) days.   [DISCONTINUED] insulin regular human CONCENTRATED (HUMULIN R U-500 KWIKPEN) 500 UNIT/ML kwikpen Inject 50 Units into the skin 3 (three) times daily with meals. Only when blood glucose is above 90 and eating.   No facility-administered encounter medications on file as of 06/26/2020.    ALLERGIES: Allergies  Allergen Reactions   Contrast Media [Iodinated Diagnostic Agents] Other (See Comments)    Pt must be premedicated before given contrast media - stops heart   Iohexol Other (See Comments)     Consult with radiologist before pre meds are given.Desc: PT. STATES HEART STOPPED HAS TO BE PREMED.     VACCINATION STATUS:  There is no immunization history on file for this  patient.  Diabetes He presents for his follow-up diabetic visit. He has type 2 diabetes mellitus. Onset time: He was diagnosed at approximate age of 89 years. His disease course has been worsening. There  are no hypoglycemic associated symptoms. Pertinent negatives for hypoglycemia include no confusion, headaches, pallor or seizures. Associated symptoms include polydipsia and polyuria. Pertinent negatives for diabetes include no blurred vision, no chest pain, no fatigue, no polyphagia and no weakness. There are no hypoglycemic complications. Symptoms are worsening. Diabetic complications include a CVA and heart disease. Risk factors for coronary artery disease include diabetes mellitus, dyslipidemia, hypertension, male sex, obesity, tobacco exposure, sedentary lifestyle and family history. Current diabetic treatments: He was supposed to be on basal insulin, unfortunately he ran out and could not afford refills and he did not call clinic for alternatives. He is compliant with treatment none of the time. His weight is decreasing steadily. He is following a generally unhealthy diet. When asked about meal planning, he reported none. He has not had a previous visit with a dietitian. He never participates in exercise. His home blood glucose trend is increasing steadily. His breakfast blood glucose range is generally >200 mg/dl. His lunch blood glucose range is generally >200 mg/dl. His dinner blood glucose range is generally >200 mg/dl. His bedtime blood glucose range is generally >200 mg/dl. His overall blood glucose range is >200 mg/dl. (He presents with still persistently above target glycemic profile.  His point-of-care A1c today is 11%,  increasing from 9.7%.  No hypoglycemia.  He made some dosing errors with he is using 500 insulin. ) An ACE inhibitor/angiotensin II receptor blocker is being taken. He does not see a podiatrist.Eye exam is not current.  Hyperlipidemia This is a chronic problem. The current  episode started more than 1 year ago. The problem is uncontrolled. Recent lipid tests were reviewed and are high. Exacerbating diseases include diabetes and obesity. Pertinent negatives include no chest pain, myalgias or shortness of breath. Current antihyperlipidemic treatment includes statins. Compliance problems include medication cost and psychosocial issues.  Risk factors for coronary artery disease include dyslipidemia, diabetes mellitus, hypertension, male sex, obesity, family history and a sedentary lifestyle.  Hypertension This is a chronic problem. The current episode started more than 1 year ago. The problem is uncontrolled. Pertinent negatives include no blurred vision, chest pain, headaches, neck pain, palpitations or shortness of breath. Risk factors for coronary artery disease include diabetes mellitus, dyslipidemia, male gender, obesity, family history, sedentary lifestyle and smoking/tobacco exposure. Past treatments include ACE inhibitors. Hypertensive end-organ damage includes CVA.     Review of systems  Constitutional: + Minimally fluctuating body weight,  current  Body mass index is 42.27 kg/m. , no fatigue, no subjective hyperthermia, no subjective hypothermia Eyes: no blurry vision, no xerophthalmia ENT: no sore throat, no nodules palpated in throat, no dysphagia/odynophagia, no hoarseness Cardiovascular: no Chest Pain, no Shortness of Breath, no palpitations, no leg swelling Respiratory: + cough, + exertional shortness of breath related to COPD. Gastrointestinal: no Nausea/Vomiting/Diarhhea Musculoskeletal: no muscle/joint aches Skin: no rashes, no hyperemia Neurological: no tremors, no numbness, no tingling, no dizziness Psychiatric: no depression, no anxiety   Objective:    BP 110/74    Pulse 80    Ht _0  (1.778 m)    Wt 294 lb 9.6 oz (133.6 kg)    BMI 42.27 kg/m   Wt Readings from Last 3 Encounters:  06/26/20 294 lb 9.6 oz (133.6 kg)  03/25/20 (!) 305 lb 12.8  oz (138.7 kg)  03/04/20 (!) 301 lb (136.5 kg)      Physical Exam- Limited  Constitutional:  Body mass index is 42.27 kg/m. , not in acute distress, normal state of mind Eyes:  EOMI, no exophthalmos Neck: Supple Thyroid: No gross goiter Respiratory: Adequate breathing efforts Musculoskeletal: no gross deformities, strength intact in all four extremities, no gross restriction of joint movements Skin:  no rashes, no hyperemia Neurological: no tremor with outstretched hands   CMP ( most recent) CMP     Component Value Date/Time   NA 136 05/09/2020 1452   K 3.7 05/09/2020 1452   CL 96 05/09/2020 1452   CO2 23 05/09/2020 1452   GLUCOSE 571 (HH) 05/09/2020 1452   GLUCOSE 209 (H) 01/30/2019 1320   BUN 7 05/09/2020 1452   CREATININE 0.96 05/09/2020 1452   CALCIUM 9.8 05/09/2020 1452   PROT 6.6 05/09/2020 1452   ALBUMIN 4.3 05/09/2020 1452   AST 22 05/09/2020 1452   ALT 33 05/09/2020 1452   ALKPHOS 106 05/09/2020 1452   BILITOT 0.3 05/09/2020 1452   GFRNONAA 87 05/09/2020 1452   GFRAA 101 05/09/2020 1452   Diabetic Labs (most recent): Lab Results  Component Value Date   HGBA1C 11.0 (A) 06/26/2020   HGBA1C 9.7 02/11/2020   HGBA1C 8.5 (H) 11/07/2019     Lipid Panel ( most recent) Lipid Panel     Component Value Date/Time   CHOL 132 07/03/2019 1324   TRIG 315 (H) 07/03/2019 1324   HDL 30 (L) 07/03/2019 1324   CHOLHDL 6.1 04/28/2014 0121   VLDL 61 (H) 04/28/2014 0121   LDLCALC 53 07/03/2019 1324     Assessment & Plan:   1. DM type 2 causing vascular disease ([place])  - Patient has currently uncontrolled symptomatic type 2 DM since  59 years of age. He presents with still persistently above target glycemic profile.  His point-of-care A1c today is 11%,  increasing from 9.7%.  No hypoglycemia.  He made some dosing errors with he is using 500 insulin. -his diabetes is complicated by coronary artery disease, CVA, obesity/sedentary life, chronic heavy smoking and LATRELLE BAZAR remains at extremely high risk  for more acute and chronic complications which include CAD, CVA, CKD, retinopathy, and neuropathy. These are all discussed in detail with the patient.  - I have counseled him on diet management and weight loss, by adopting a carbohydrate restricted/protein rich diet.  - he  admits there is a room for improvement in his diet and drink choices. -  Suggestion is made for him to avoid simple carbohydrates  from his diet including Cakes, Sweet Desserts / Pastries, Ice Cream, Soda (diet and regular), Sweet Tea, Candies, Chips, Cookies, Sweet Pastries,  Store Bought Juices, Alcohol in Excess of  1-2 drinks a day, Artificial Sweeteners, Coffee Creamer, and "Sugar-free" Products. This will help patient to have stable blood glucose profile and potentially avoid unintended weight gain.  - I encouraged him to switch to  unprocessed or minimally processed complex starch and increased protein intake (animal or plant source), fruits, and vegetables.  - he is advised to stick to a routine mealtimes to eat 3 meals  a day and avoid unnecessary snacks ( to snack only to correct hypoglycemia).   - I have approached him with the following individualized plan to manage diabetes and patient agrees:   - He has significant cognitive deficit, is having hard time controlling glycemic profile even with simplified insulin regimen with sliding insulin 500.    -Priority is still to avoid hypoglycemia in this patient.    -He is advised to increase insulin U500 60 units  3 times daily AC for Premeal blood glucose readings above 90  mg/dl, associated with strict monitoring of blood glucose 4 times a day-before meals and at bedtime. -This patient will benefit from a CGM device.  I discussed and prescribed the freestyle libre for him.    -He has benefited from glipizide treatment.  He is advised to continue glipizide 10 mg XL p.o. daily at breakfast.    -He does not tolerate metformin due  to GI side effects.   -Patient is encouraged to call clinic for blood glucose levels less than 70 or above 300 mg /dl.  -He is advised to continue Januvia 50 mg p.o. daily with breakfast.  -Incretin therapy will be administered with great caution in this patient with hypertriglyceridemia, and chronic heavy smoking.   - Patient specific target  A1c;  LDL, HDL, Triglycerides,  were discussed in detail.  2) BP/HTN:   -His blood pressure is controlled to target.  The patient continues to smoke heavily. The patient was counseled on the dangers of tobacco use, and was advised to quit.  Reviewed strategies to maximize success, including removing cigarettes and smoking materials from environment.  -I recently increase his lisinopril to 10 mg daily.      3) Lipids/HPL: His  lipid panel is not controlled  with severe hypertriglyceridemia   of 315 improving from 469.  He is advised to continue Trilipix 135 mg p.o. nightly.    4)  Weight/Diet: His BMI is 81.85-UDJSHFW complicating his diabetes care.  He is a candidate for modest weight loss.  CDE Consult has been  initiated , exercise, and detailed carbohydrates information provided.  5) vitamin D deficiency: He is on ongoing supplement with vitamin D2 50,000 units weekly for 12 weeks.  6) Chronic Care/Health Maintenance:  -he  is on ACEI/ARB and Statin medications and  is encouraged to continue to follow up with Ophthalmology, Dentist,  Podiatrist at least yearly or according to recommendations, and advised to  quit smoking. I have recommended yearly flu vaccine and pneumonia vaccination at least every 5 years; moderate intensity exercise for up to 150 minutes weekly; and  sleep for at least 7 hours a day.  Regarding his heavy smoking: The patient was counseled on the dangers of tobacco use, and was advised to quit.  Reviewed strategies to maximize success, including removing cigarettes and smoking materials from environment.   - I advised patient  to maintain close follow up with Sharilyn Sites, MD for primary care needs.  - Time spent on this patient care encounter:  45 min, of which > 50% was spent in  counseling and the rest reviewing his blood glucose logs , discussing his hypoglycemia and hyperglycemia episodes, reviewing his current and  previous labs / studies  ( including abstraction from other facilities) and medications  doses and developing a  long term treatment plan and documenting his care.   Please refer to Patient Instructions for Blood Glucose Monitoring and Insulin/Medications Dosing Guide"  in media tab for additional information. Please  also refer to " Patient Self Inventory" in the Media  tab for reviewed elements of pertinent patient history.  [name] participated in the discussions, expressed understanding, and voiced agreement with the above plans.  All questions were answered to his satisfaction. he is encouraged to contact clinic should he have any questions or concerns prior to his return visit.    Follow up plan: - Return in about 3 months (around 09/26/2020) for Bring Meter and Logs- A1c in Office.   Glade Lloyd, MD Phone: [phone]  Fax: [phone]   06/26/2020, 5:08 PM   This note was partially dictated with voice recognition software. Similar sounding words can be transcribed inadequately or may not  be corrected upon review.

## 2020-06-26 NOTE — Patient Instructions (Signed)

## 2020-06-30 DIAGNOSIS — G4730 Sleep apnea, unspecified: Secondary | ICD-10-CM | POA: Diagnosis not present

## 2020-06-30 DIAGNOSIS — G4733 Obstructive sleep apnea (adult) (pediatric): Secondary | ICD-10-CM | POA: Diagnosis not present

## 2020-06-30 DIAGNOSIS — G4710 Hypersomnia, unspecified: Secondary | ICD-10-CM | POA: Diagnosis not present

## 2020-06-30 DIAGNOSIS — G4714 Hypersomnia due to medical condition: Secondary | ICD-10-CM | POA: Diagnosis not present

## 2020-06-30 DIAGNOSIS — Z79899 Other long term (current) drug therapy: Secondary | ICD-10-CM | POA: Diagnosis not present

## 2020-06-30 DIAGNOSIS — I10 Essential (primary) hypertension: Secondary | ICD-10-CM | POA: Diagnosis not present

## 2020-07-01 DIAGNOSIS — Z6841 Body Mass Index (BMI) 40.0 and over, adult: Secondary | ICD-10-CM | POA: Diagnosis not present

## 2020-07-01 DIAGNOSIS — Z720 Tobacco use: Secondary | ICD-10-CM | POA: Diagnosis not present

## 2020-07-01 DIAGNOSIS — Z125 Encounter for screening for malignant neoplasm of prostate: Secondary | ICD-10-CM | POA: Diagnosis not present

## 2020-07-01 DIAGNOSIS — E7849 Other hyperlipidemia: Secondary | ICD-10-CM | POA: Diagnosis not present

## 2020-07-01 DIAGNOSIS — I25700 Atherosclerosis of coronary artery bypass graft(s), unspecified, with unstable angina pectoris: Secondary | ICD-10-CM | POA: Diagnosis not present

## 2020-07-01 DIAGNOSIS — J984 Other disorders of lung: Secondary | ICD-10-CM | POA: Diagnosis not present

## 2020-07-01 DIAGNOSIS — I2510 Atherosclerotic heart disease of native coronary artery without angina pectoris: Secondary | ICD-10-CM | POA: Diagnosis not present

## 2020-07-01 DIAGNOSIS — I480 Paroxysmal atrial fibrillation: Secondary | ICD-10-CM | POA: Diagnosis not present

## 2020-07-01 DIAGNOSIS — J441 Chronic obstructive pulmonary disease with (acute) exacerbation: Secondary | ICD-10-CM | POA: Diagnosis not present

## 2020-07-01 DIAGNOSIS — Z1389 Encounter for screening for other disorder: Secondary | ICD-10-CM | POA: Diagnosis not present

## 2020-07-01 DIAGNOSIS — J449 Chronic obstructive pulmonary disease, unspecified: Secondary | ICD-10-CM | POA: Diagnosis not present

## 2020-07-01 DIAGNOSIS — E039 Hypothyroidism, unspecified: Secondary | ICD-10-CM | POA: Diagnosis not present

## 2020-07-01 DIAGNOSIS — G459 Transient cerebral ischemic attack, unspecified: Secondary | ICD-10-CM | POA: Diagnosis not present

## 2020-07-09 ENCOUNTER — Encounter: Payer: Self-pay | Admitting: Internal Medicine

## 2020-07-11 DIAGNOSIS — I213 ST elevation (STEMI) myocardial infarction of unspecified site: Secondary | ICD-10-CM | POA: Diagnosis not present

## 2020-07-11 DIAGNOSIS — E1159 Type 2 diabetes mellitus with other circulatory complications: Secondary | ICD-10-CM | POA: Diagnosis not present

## 2020-07-11 DIAGNOSIS — Z1389 Encounter for screening for other disorder: Secondary | ICD-10-CM | POA: Diagnosis not present

## 2020-07-11 DIAGNOSIS — I480 Paroxysmal atrial fibrillation: Secondary | ICD-10-CM | POA: Diagnosis not present

## 2020-07-11 DIAGNOSIS — I639 Cerebral infarction, unspecified: Secondary | ICD-10-CM | POA: Diagnosis not present

## 2020-07-11 DIAGNOSIS — Z0001 Encounter for general adult medical examination with abnormal findings: Secondary | ICD-10-CM | POA: Diagnosis not present

## 2020-07-11 DIAGNOSIS — Z6841 Body Mass Index (BMI) 40.0 and over, adult: Secondary | ICD-10-CM | POA: Diagnosis not present

## 2020-07-11 DIAGNOSIS — I700 Atherosclerosis of aorta: Secondary | ICD-10-CM | POA: Diagnosis not present

## 2020-07-14 ENCOUNTER — Telehealth: Payer: Self-pay | Admitting: "Endocrinology

## 2020-07-14 DIAGNOSIS — Z794 Long term (current) use of insulin: Secondary | ICD-10-CM | POA: Diagnosis not present

## 2020-07-14 DIAGNOSIS — H25013 Cortical age-related cataract, bilateral: Secondary | ICD-10-CM | POA: Diagnosis not present

## 2020-07-14 DIAGNOSIS — H5202 Hypermetropia, left eye: Secondary | ICD-10-CM | POA: Diagnosis not present

## 2020-07-14 DIAGNOSIS — E119 Type 2 diabetes mellitus without complications: Secondary | ICD-10-CM | POA: Diagnosis not present

## 2020-07-14 DIAGNOSIS — H52203 Unspecified astigmatism, bilateral: Secondary | ICD-10-CM | POA: Diagnosis not present

## 2020-07-14 DIAGNOSIS — H5211 Myopia, right eye: Secondary | ICD-10-CM | POA: Diagnosis not present

## 2020-07-14 DIAGNOSIS — H524 Presbyopia: Secondary | ICD-10-CM | POA: Diagnosis not present

## 2020-07-14 DIAGNOSIS — H2513 Age-related nuclear cataract, bilateral: Secondary | ICD-10-CM | POA: Diagnosis not present

## 2020-07-14 LAB — HM DIABETES EYE EXAM

## 2020-07-14 NOTE — Telephone Encounter (Signed)
Pt is calling and states we did not send the correct information for the free style libre.

## 2020-07-14 NOTE — Telephone Encounter (Signed)
Talked with ADS and updated pt's diagnosis code.

## 2020-07-16 DIAGNOSIS — Z0100 Encounter for examination of eyes and vision without abnormal findings: Secondary | ICD-10-CM | POA: Diagnosis not present

## 2020-07-17 DIAGNOSIS — E1159 Type 2 diabetes mellitus with other circulatory complications: Secondary | ICD-10-CM | POA: Diagnosis not present

## 2020-07-21 ENCOUNTER — Telehealth: Payer: Self-pay

## 2020-07-21 ENCOUNTER — Other Ambulatory Visit: Payer: Self-pay

## 2020-07-21 ENCOUNTER — Ambulatory Visit (INDEPENDENT_AMBULATORY_CARE_PROVIDER_SITE_OTHER): Payer: Medicare HMO | Admitting: "Endocrinology

## 2020-07-21 DIAGNOSIS — E1159 Type 2 diabetes mellitus with other circulatory complications: Secondary | ICD-10-CM

## 2020-07-21 DIAGNOSIS — G4733 Obstructive sleep apnea (adult) (pediatric): Secondary | ICD-10-CM

## 2020-07-21 NOTE — Progress Notes (Signed)
Pt brought his Freestyle Libre 2 CGM to the office. We discussed and demonstrated how to set up and use the reader. Demonstrated to pt how to apply the sensor. Sensor was placed on the inner R upper arm without difficulty. Discussed and answered pt's questions and advised him if he had any concerns or questions later to contact the office. Pt voiced understanding.

## 2020-07-21 NOTE — Telephone Encounter (Signed)
Pt called stating he's using his Freestyle Libre 2 which showed his BG at 380, states he checked his BG by fingerstick which resulted at 421. States his BG Saturday was 243 at breakfast, 333 at lunch and 249 at bedtime. Sunday 349 at breakfast, pt states he did not check his BG the rest of the day because he fell asleep. This morning his BG was 386 Pt states he takes glipizide 10mg  daily, Humulin R U-500 60 units three times daily and januvia 50mg  daily. Discussed pt's BG readings and medications with Rayetta Pigg NP. Advised pt to increase Humulin R U-500 to 70 units three times daily, continue oral medications as prescribed and contact the office if BG continues to be elevated per Rayetta Pigg NP. Understanding voiced per pt.

## 2020-07-27 DIAGNOSIS — G4733 Obstructive sleep apnea (adult) (pediatric): Secondary | ICD-10-CM | POA: Diagnosis not present

## 2020-07-27 DIAGNOSIS — J449 Chronic obstructive pulmonary disease, unspecified: Secondary | ICD-10-CM | POA: Diagnosis not present

## 2020-07-29 ENCOUNTER — Ambulatory Visit: Payer: Medicare HMO | Admitting: Pulmonary Disease

## 2020-07-29 ENCOUNTER — Other Ambulatory Visit: Payer: Self-pay

## 2020-07-29 ENCOUNTER — Encounter: Payer: Self-pay | Admitting: Pulmonary Disease

## 2020-07-29 VITALS — BP 144/94 | HR 82 | Temp 97.3°F | Ht 70.0 in | Wt 302.2 lb

## 2020-07-29 DIAGNOSIS — G4733 Obstructive sleep apnea (adult) (pediatric): Secondary | ICD-10-CM

## 2020-07-29 DIAGNOSIS — J432 Centrilobular emphysema: Secondary | ICD-10-CM

## 2020-07-29 DIAGNOSIS — R69 Illness, unspecified: Secondary | ICD-10-CM | POA: Diagnosis not present

## 2020-07-29 DIAGNOSIS — F17200 Nicotine dependence, unspecified, uncomplicated: Secondary | ICD-10-CM | POA: Diagnosis not present

## 2020-07-29 DIAGNOSIS — R911 Solitary pulmonary nodule: Secondary | ICD-10-CM | POA: Diagnosis not present

## 2020-07-29 NOTE — Assessment & Plan Note (Signed)
We will once again attempt to obtain download from his DME and make changes if needed -He continues to use oxygen blended into his CPAP machine although his titration study did not demonstrate a need for this  Weight loss encouraged, compliance with goal of at least 4-6 hrs every night is the expectation. Advised against medications with sedative side effects Cautioned against driving when sleepy - understanding that sleepiness will vary on a day to day basis

## 2020-07-29 NOTE — Assessment & Plan Note (Signed)
Smoking cessation emphasized is the most important intervention that would add years to his life

## 2020-07-29 NOTE — Progress Notes (Signed)
   Subjective:    Patient ID: [name], male    DOB: 02/19/1961, 58 y.o.   MRN: [id number]  HPI  59 year old obese smoker  for FU of  COPD and OSA and lung nodules. OSA -on CPAP 8 cm + 3L O2  He smokes more than 40 pack years, has now cut down to half pack per day.  He is maintained on a regimen of Trelegy and albuterol MDI.  He uses his nebs about 2-3 times a week. Lung nodules were initially noted on a CT scan in 2017 and 12/2018 right upper lobe nodule appears slightly increased to 9 mm other nodules were stable.  CT scans from 03/2020 06/2020 were reviewed which show slight increase in size of nodule to 12 mm.  COPD -breathing is stable, PFTs were reviewed today, he is compliant with Trelegy  He remains compliant with his CPAP and denies any problems with mask or pressure.  He does use oxygen blended into CPAP.  Reviewed endocrine and cardiology consultation reports, sugars have been running high 400-400 range last HbA1c 06/2020 was 11.0  PMH -CABG 2008 follows with [facility], diabetes, insulin requiring Cor angio 03/2020  2/4 grafts patent, LIMA to LAD and SVG to Ramus. Occluded SVG to RCA, occluded SVG to OM. Both of these are old findings nml LVEF   Cardiolite SPECT study 01/2020 revealed large perfusion abnormality in the inferior/lateral region.   Significant tests/ events reviewed PFTs 06/2020 moderate airway obstruction with ratio of 70, FEV1 1.90/51%, FVC 2.70/55%, no bronchodilator response, TLC 76%, DLCO 16.5/58%  CT chest 06/2020 RUL GG 12 x 10 mmnodule    CT chest 12/2018 advanced changes of emphysema , multiple noncalcified nodules unchanged compared to 09/2018, largest right upper lobe 9 mm slight increased compared to 12/2016  NPSG 11/2007 mild OSA 5.7/hour 07/2015 NPSG AHI 16/h  06/2016 CPAP titration-2 9 7  pounds, no O2 required CPAP 8cm  Review of Systems neg for any significant sore throat, dysphagia, itching, sneezing, nasal congestion or excess/  purulent secretions, fever, chills, sweats, unintended wt loss, pleuritic or exertional cp, hempoptysis, orthopnea pnd or change in chronic leg swelling. Also denies presyncope, palpitations, heartburn, abdominal pain, nausea, vomiting, diarrhea or change in bowel or urinary habits, dysuria,hematuria, rash, arthralgias, visual complaints, headache, numbness weakness or ataxia.     Objective:   Physical Exam  Gen. Pleasant, obese, in no distress, normal affect ENT - no pallor,icterus, no post nasal drip, class 2-3 airway Neck: No JVD, no thyromegaly, no carotid bruits Lungs: no use of accessory muscles, no dullness to percussion, decreased without rales or rhonchi  Cardiovascular: Rhythm regular, heart sounds  normal, no murmurs or gallops, no peripheral edema Abdomen: soft and non-tender, no hepatosplenomegaly, BS normal. Musculoskeletal: No deformities, no cyanosis or clubbing Neuro:  alert, non focal, no tremors       Assessment & Plan:

## 2020-07-29 NOTE — Assessment & Plan Note (Signed)
Right upper lobe nodule has enlarged slowly from insignificant in 2018 to current size of 12 mm. He will need PET scan, this is likely malignant, slow-growing.  His sugars are very uncontrolled currently and would like better control before scheduling PET scan hence we will schedule in November this will also allow [place] to assess for interval growth. This also gives him an opportunity to have some weight loss and smoking cessation

## 2020-07-29 NOTE — Patient Instructions (Addendum)
Enlarging nodule in right upper lung - I am concerned about cancer PET scan in November  Stay on trelegy

## 2020-07-29 NOTE — Assessment & Plan Note (Signed)
Continue on Trelegy. Flu shot offered today he does not remember whether he got this from his PCP Covid vaccination was advised

## 2020-07-31 ENCOUNTER — Ambulatory Visit: Payer: Medicare HMO | Admitting: Pulmonary Disease

## 2020-07-31 DIAGNOSIS — E1159 Type 2 diabetes mellitus with other circulatory complications: Secondary | ICD-10-CM | POA: Diagnosis not present

## 2020-07-31 DIAGNOSIS — J449 Chronic obstructive pulmonary disease, unspecified: Secondary | ICD-10-CM | POA: Diagnosis not present

## 2020-07-31 DIAGNOSIS — E1165 Type 2 diabetes mellitus with hyperglycemia: Secondary | ICD-10-CM | POA: Diagnosis not present

## 2020-07-31 DIAGNOSIS — I10 Essential (primary) hypertension: Secondary | ICD-10-CM | POA: Diagnosis not present

## 2020-08-17 DIAGNOSIS — E1159 Type 2 diabetes mellitus with other circulatory complications: Secondary | ICD-10-CM | POA: Diagnosis not present

## 2020-08-18 ENCOUNTER — Other Ambulatory Visit: Payer: Self-pay | Admitting: "Endocrinology

## 2020-08-19 ENCOUNTER — Telehealth: Payer: Self-pay

## 2020-08-19 ENCOUNTER — Other Ambulatory Visit: Payer: Self-pay

## 2020-08-19 ENCOUNTER — Other Ambulatory Visit: Payer: Self-pay | Admitting: Gastroenterology

## 2020-08-19 DIAGNOSIS — E1159 Type 2 diabetes mellitus with other circulatory complications: Secondary | ICD-10-CM

## 2020-08-19 MED ORDER — HUMULIN R U-500 KWIKPEN 500 UNIT/ML ~~LOC~~ SOPN: 70.0000 [IU] | PEN_INJECTOR | Freq: Three times a day (TID) | SUBCUTANEOUS | 3 refills | Status: DC

## 2020-08-19 NOTE — Telephone Encounter (Signed)
I received the form and sent it in to [employer]

## 2020-08-19 NOTE — Telephone Encounter (Signed)
Pt needs his insulin regular human CONCENTRATED (HUMULIN R U-500 KWIKPEN) 500 UNIT/ML kwikpen sent to [phone] (phone) the fax number is [phone]. He said he can get it free through here, he does not remember the name of the company. I believe that is [employer].

## 2020-08-22 ENCOUNTER — Ambulatory Visit: Payer: Medicare HMO | Admitting: Primary Care

## 2020-08-22 ENCOUNTER — Other Ambulatory Visit: Payer: Self-pay

## 2020-08-22 DIAGNOSIS — R911 Solitary pulmonary nodule: Secondary | ICD-10-CM

## 2020-08-22 NOTE — Patient Instructions (Signed)
Peer to peer

## 2020-08-22 NOTE — Progress Notes (Signed)
Contacted today for a peer to peer.  59 year old male, current everyday smoker.  Medical history significant for COPD, OSA, pulmonary nodule.  Patient of Dr. Elsworth Soho, lastg seen on 07/29/20. Patient has an enlarging right upper lobe pulmonary nodule, current size 54mm. Concern for malignancy, slow growing.  Ordered for PET scan in November. CT chest wo contrast which was ordered before PET scan was approved can be cancelled.   Approval code for PET scan - [id number]  Date 08/13/20- 02/10/21

## 2020-08-25 DIAGNOSIS — K219 Gastro-esophageal reflux disease without esophagitis: Secondary | ICD-10-CM | POA: Diagnosis not present

## 2020-08-25 DIAGNOSIS — G4733 Obstructive sleep apnea (adult) (pediatric): Secondary | ICD-10-CM | POA: Diagnosis not present

## 2020-08-25 DIAGNOSIS — J449 Chronic obstructive pulmonary disease, unspecified: Secondary | ICD-10-CM | POA: Diagnosis not present

## 2020-08-25 DIAGNOSIS — R070 Pain in throat: Secondary | ICD-10-CM | POA: Diagnosis not present

## 2020-08-30 DIAGNOSIS — I10 Essential (primary) hypertension: Secondary | ICD-10-CM | POA: Diagnosis not present

## 2020-08-30 DIAGNOSIS — E1165 Type 2 diabetes mellitus with hyperglycemia: Secondary | ICD-10-CM | POA: Diagnosis not present

## 2020-08-30 DIAGNOSIS — E1159 Type 2 diabetes mellitus with other circulatory complications: Secondary | ICD-10-CM | POA: Diagnosis not present

## 2020-08-30 DIAGNOSIS — J449 Chronic obstructive pulmonary disease, unspecified: Secondary | ICD-10-CM | POA: Diagnosis not present

## 2020-09-02 ENCOUNTER — Other Ambulatory Visit: Payer: Medicare HMO

## 2020-09-02 ENCOUNTER — Other Ambulatory Visit: Payer: Self-pay | Admitting: *Deleted

## 2020-09-02 DIAGNOSIS — Z20822 Contact with and (suspected) exposure to covid-19: Secondary | ICD-10-CM | POA: Diagnosis not present

## 2020-09-03 ENCOUNTER — Other Ambulatory Visit: Payer: Self-pay | Admitting: Pulmonary Disease

## 2020-09-03 LAB — SPECIMEN STATUS REPORT

## 2020-09-03 LAB — NOVEL CORONAVIRUS, NAA

## 2020-09-04 ENCOUNTER — Telehealth: Payer: Self-pay | Admitting: Family Medicine

## 2020-09-04 ENCOUNTER — Other Ambulatory Visit: Payer: Medicare HMO

## 2020-09-04 DIAGNOSIS — R0602 Shortness of breath: Secondary | ICD-10-CM | POA: Diagnosis not present

## 2020-09-04 DIAGNOSIS — J80 Acute respiratory distress syndrome: Secondary | ICD-10-CM | POA: Diagnosis not present

## 2020-09-04 DIAGNOSIS — E1165 Type 2 diabetes mellitus with hyperglycemia: Secondary | ICD-10-CM | POA: Diagnosis not present

## 2020-09-04 DIAGNOSIS — Z209 Contact with and (suspected) exposure to unspecified communicable disease: Secondary | ICD-10-CM | POA: Diagnosis not present

## 2020-09-04 NOTE — Telephone Encounter (Signed)
Advised pt of covid result issue and he will be retesting today at [facility] location

## 2020-09-08 ENCOUNTER — Ambulatory Visit (HOSPITAL_COMMUNITY)
Admission: RE | Admit: 2020-09-08 | Discharge: 2020-09-08 | Disposition: A | Discharge status: Home / Self Care | Payer: Medicare HMO | Source: Ambulatory Visit | Attending: Pulmonary Disease | Admitting: Pulmonary Disease

## 2020-09-08 ENCOUNTER — Other Ambulatory Visit: Payer: Self-pay

## 2020-09-08 DIAGNOSIS — R911 Solitary pulmonary nodule: Secondary | ICD-10-CM | POA: Diagnosis not present

## 2020-09-08 MED ORDER — FLUDEOXYGLUCOSE F - 18 (FDG) INJECTION
16.6800 | Freq: Once | INTRAVENOUS | Status: AC | PRN
  Administered 2020-09-08: 16.68 via INTRAVENOUS

## 2020-09-09 ENCOUNTER — Other Ambulatory Visit: Payer: Self-pay

## 2020-09-09 ENCOUNTER — Telehealth: Payer: Self-pay | Admitting: Pulmonary Disease

## 2020-09-09 ENCOUNTER — Other Ambulatory Visit: Payer: Medicare HMO

## 2020-09-09 DIAGNOSIS — Z20822 Contact with and (suspected) exposure to covid-19: Secondary | ICD-10-CM

## 2020-09-09 DIAGNOSIS — R911 Solitary pulmonary nodule: Secondary | ICD-10-CM

## 2020-09-09 NOTE — Telephone Encounter (Signed)
Called and spoke with patient, he would like to know if the nodule seen on the scan could be biopsied.  He stated he knows that he has a f/u with Dr. Elsworth Soho coming up later this month and then a repeat of the CT in 6 months, but he would like to know what it is and be safe.    Dr. Elsworth Soho, Please advise regarding having the nodule biopsied.  Thank you.

## 2020-09-09 NOTE — Progress Notes (Signed)
Called and went over PET scan result per Dr Elsworth Soho with patient. All questions answered and patient expressed understanding. Patient agreeable to Dr Bari Mantis recommendation for CT in 6 months. Order placed per Dr Elsworth Soho. Confirmed upcoming office visit on 09/22/2020 @11 :45. Nothing further needed at this time.

## 2020-09-10 LAB — SARS-COV-2, NAA 2 DAY TAT

## 2020-09-10 LAB — NOVEL CORONAVIRUS, NAA: SARS-CoV-2, NAA: NOT DETECTED

## 2020-09-10 LAB — SPECIMEN STATUS REPORT

## 2020-09-10 NOTE — Telephone Encounter (Signed)
Called and spoke with pt letting him know the info stated by RA and he verbalized understanding. Nothing further needed. 

## 2020-09-10 NOTE — Telephone Encounter (Signed)
Would recommend a wait and watch approach since PET scan did not show hypermetabolism. I will discuss more at his follow-up visit

## 2020-09-11 ENCOUNTER — Ambulatory Visit (HOSPITAL_COMMUNITY): Payer: Medicare HMO

## 2020-09-11 ENCOUNTER — Telehealth: Payer: Self-pay | Admitting: General Practice

## 2020-09-11 DIAGNOSIS — Z794 Long term (current) use of insulin: Secondary | ICD-10-CM | POA: Diagnosis not present

## 2020-09-11 DIAGNOSIS — I25708 Atherosclerosis of coronary artery bypass graft(s), unspecified, with other forms of angina pectoris: Secondary | ICD-10-CM | POA: Diagnosis not present

## 2020-09-11 DIAGNOSIS — E1159 Type 2 diabetes mellitus with other circulatory complications: Secondary | ICD-10-CM | POA: Diagnosis not present

## 2020-09-11 DIAGNOSIS — I10 Essential (primary) hypertension: Secondary | ICD-10-CM | POA: Diagnosis not present

## 2020-09-11 NOTE — Telephone Encounter (Signed)
Patient called to get COVID results. Made him aware they were negative.

## 2020-09-17 DIAGNOSIS — E1159 Type 2 diabetes mellitus with other circulatory complications: Secondary | ICD-10-CM | POA: Diagnosis not present

## 2020-09-22 ENCOUNTER — Encounter: Payer: Self-pay | Admitting: Pulmonary Disease

## 2020-09-22 ENCOUNTER — Ambulatory Visit: Payer: Medicare HMO | Admitting: Pulmonary Disease

## 2020-09-22 ENCOUNTER — Other Ambulatory Visit: Payer: Self-pay

## 2020-09-22 VITALS — BP 122/70 | HR 98 | Temp 97.3°F | Ht 70.0 in | Wt 310.0 lb

## 2020-09-22 DIAGNOSIS — Z23 Encounter for immunization: Secondary | ICD-10-CM | POA: Diagnosis not present

## 2020-09-22 DIAGNOSIS — R911 Solitary pulmonary nodule: Secondary | ICD-10-CM | POA: Diagnosis not present

## 2020-09-22 DIAGNOSIS — F17200 Nicotine dependence, unspecified, uncomplicated: Secondary | ICD-10-CM | POA: Diagnosis not present

## 2020-09-22 DIAGNOSIS — G4733 Obstructive sleep apnea (adult) (pediatric): Secondary | ICD-10-CM

## 2020-09-22 DIAGNOSIS — J432 Centrilobular emphysema: Secondary | ICD-10-CM

## 2020-09-22 DIAGNOSIS — R69 Illness, unspecified: Secondary | ICD-10-CM | POA: Diagnosis not present

## 2020-09-22 MED ORDER — ALBUTEROL SULFATE (2.5 MG/3ML) 0.083% IN NEBU: 2.5000 mg | INHALATION_SOLUTION | Freq: Four times a day (QID) | RESPIRATORY_TRACT | 12 refills | Status: DC | PRN

## 2020-09-22 NOTE — Assessment & Plan Note (Signed)
We discussed that PET scan does not show significant hypermetabolism and this is reassuring however given slight increase in size of nodule over the past, this could still be low-grade lung cancer.  We will repeat CT scan without contrast in 6 months and will need continued surveillance

## 2020-09-22 NOTE — Assessment & Plan Note (Signed)
Continue CPAP 8 cm   Weight loss encouraged, compliance with goal of at least 4-6 hrs every night is the expectation. Advised against medications with sedative side effects Cautioned against driving when sleepy - understanding that sleepiness will vary on a day to day basis

## 2020-09-22 NOTE — Assessment & Plan Note (Signed)
Smoking cessation again emphasized is the most important intervention

## 2020-09-22 NOTE — Progress Notes (Signed)
   Subjective:    Patient ID: [name], male    DOB: [birth year], 59 y.o.   MRN: [id number]  HPI  59 year old obese smoker  for FU of  COPD and OSA and lung nodules. OSA -on CPAP 8 cm + 3L O2  PMH -CABG 2008 follows with [facility] cardiology, diabetes, insulin requiring Cor angio 03/2020  2/4 grafts patent, LIMA to LAD and SVG to Ramus. Occluded SVG to RCA, occluded SVG to OM. Both of these are old findings nml LVEF   Cardiolite SPECT study 01/2020 revealed large perfusion abnormality in the inferior/lateral region.  He smoked more than 40 pack years, continues about a pack per day Lung nodules were initially noted on a CT scan in 2017 and 12/2018 right upper lobe nodule appears slightly increased to 9 mm other nodules were stable.  CT scans from 03/2020 06/2020 were reviewed which show slight increase in size of nodule to 12 mm. We reviewed PET scan today.  Breathing appears poor, always has wheezing, compliant with Trelegy, uses albuterol MDI 2-3 times daily. Weight is unchanged   Significant tests/ events reviewed PFTs 06/2020 moderate airway obstruction with ratio of 70, FEV1 1.90/57%, FVC 2.70/55%, no bronchodilator response, TLC 76%, DLCO 16.5/58%  PET 09/2020 >> SUV 2.1  CT chest 06/2020 RUL GG 12 x 10 mmnodule    CT chest 12/2018 advanced changes of emphysema,multiple noncalcified nodules unchanged compared to 09/2018, largest right upper lobe 9 mm slight increasedcompared to 12/2016  NPSG 1/20103mild OSA 5.7/hour 07/2015 NPSG AHI 16/h  06/2016 CPAP titration-2 9 7  pounds,no O2 required CPAP 8cm  Review of Systems neg for any significant sore throat, dysphagia, itching, sneezing, nasal congestion or excess/ purulent secretions, fever, chills, sweats, unintended wt loss, pleuritic or exertional cp, hempoptysis, orthopnea pnd or change in chronic leg swelling. Also denies presyncope, palpitations, heartburn, abdominal pain, nausea, vomiting, diarrhea or change in  bowel or urinary habits, dysuria,hematuria, rash, arthralgias, visual complaints, headache, numbness weakness or ataxia.     Objective:   Physical Exam  Gen. Pleasant, obese, in no distress ENT - no lesions, no post nasal drip Neck: No JVD, no thyromegaly, no carotid bruits Lungs: no use of accessory muscles, no dullness to percussion, decreased without rales or rhonchi  Cardiovascular: Rhythm regular, heart sounds  normal, no murmurs or gallops, no peripheral edema Musculoskeletal: No deformities, no cyanosis or clubbing , no tremors        Assessment & Plan:

## 2020-09-22 NOTE — Patient Instructions (Addendum)
CT chest WO contrast  May 2022 Stay on trelegy once daily - rinse mouth after use  Rx for nebuliser with albuterol # 30 - take as needed every 6h

## 2020-09-22 NOTE — Assessment & Plan Note (Signed)
Continue Trelegy. We will provide him a nebulizer and albuterol nebs to use on an as-needed basis as well as for an emergency

## 2020-09-22 NOTE — Addendum Note (Signed)
Addended by: Lia Foyer R on: 09/22/2020 05:19 PM   Modules accepted: Orders

## 2020-09-24 DIAGNOSIS — R69 Illness, unspecified: Secondary | ICD-10-CM | POA: Diagnosis not present

## 2020-09-25 DIAGNOSIS — J449 Chronic obstructive pulmonary disease, unspecified: Secondary | ICD-10-CM | POA: Diagnosis not present

## 2020-09-25 DIAGNOSIS — G4733 Obstructive sleep apnea (adult) (pediatric): Secondary | ICD-10-CM | POA: Diagnosis not present

## 2020-09-29 DIAGNOSIS — J432 Centrilobular emphysema: Secondary | ICD-10-CM | POA: Diagnosis not present

## 2020-09-29 DIAGNOSIS — R911 Solitary pulmonary nodule: Secondary | ICD-10-CM | POA: Diagnosis not present

## 2020-09-30 ENCOUNTER — Encounter: Payer: Self-pay | Admitting: Nurse Practitioner

## 2020-09-30 ENCOUNTER — Other Ambulatory Visit: Payer: Self-pay

## 2020-09-30 ENCOUNTER — Telehealth: Payer: Self-pay

## 2020-09-30 ENCOUNTER — Ambulatory Visit: Payer: Medicare HMO | Admitting: Nurse Practitioner

## 2020-09-30 VITALS — BP 91/69 | HR 82 | Temp 97.1°F | Ht 70.0 in | Wt 309.6 lb

## 2020-09-30 DIAGNOSIS — R1319 Other dysphagia: Secondary | ICD-10-CM

## 2020-09-30 DIAGNOSIS — K2270 Barrett's esophagus without dysplasia: Secondary | ICD-10-CM | POA: Diagnosis not present

## 2020-09-30 DIAGNOSIS — D126 Benign neoplasm of colon, unspecified: Secondary | ICD-10-CM | POA: Diagnosis not present

## 2020-09-30 DIAGNOSIS — I10 Essential (primary) hypertension: Secondary | ICD-10-CM | POA: Diagnosis not present

## 2020-09-30 DIAGNOSIS — J449 Chronic obstructive pulmonary disease, unspecified: Secondary | ICD-10-CM | POA: Diagnosis not present

## 2020-09-30 DIAGNOSIS — K219 Gastro-esophageal reflux disease without esophagitis: Secondary | ICD-10-CM

## 2020-09-30 DIAGNOSIS — E1165 Type 2 diabetes mellitus with hyperglycemia: Secondary | ICD-10-CM | POA: Diagnosis not present

## 2020-09-30 DIAGNOSIS — E1159 Type 2 diabetes mellitus with other circulatory complications: Secondary | ICD-10-CM | POA: Diagnosis not present

## 2020-09-30 NOTE — Patient Instructions (Addendum)
Your health issues we discussed today were:   Previous colon polyps and need for repeat colonoscopy: 1. As we discussed we will contact her cardiologist to get their okay to have the colonoscopy completed 2. We will also ask the cardiologist if it is okay to hold your blood thinner for about 2 days prior to your procedure 3. If everything is good to go up then we will call you to schedule your colonoscopy 4. Further recommendations will be made after colonoscopy 5. Call us if you have any questions about your colonoscopy or your colonoscopy prep instructions  GERD (reflux/heartburn) with dysphagia (swallowing difficulties): 1. Glad your symptoms are doing better 2. Continue taking your pantoprazole and your Valium to help with your swallowing difficulties and reflux 3. Call us for any worsening or recurrent symptoms  Barrett's esophagus (slightly abnormal tissue in your swallowing tube): 1. As you may have already been told to you, because of Barrett's esophagus you will need to be on an acid blocker such as pantoprazole (Protonix) for the rest of your life 2. You will need to have regular endoscopies.  You are currently up-to-date and your next endoscopy will need to be completed in 2026 3. Call us if you have any worsening symptoms including worsening swallowing difficulties  Overall I recommend:  1. Continue your other current medications 2. Return for follow-up in 1 year 3. Call us if you have any questions or concerns   ---------------------------------------------------------------  At [facility] we value your feedback. You may receive a survey about your visit today. Please share your experience as we strive to create trusting relationships with our patients to provide genuine, compassionate, quality care.  We appreciate your understanding and patience as we review any laboratory studies, imaging, and other diagnostic tests that are ordered as we care for you. Our  office policy is 5 business days for review of these results, and any emergent or urgent results are addressed in a timely manner for your best interest. If you do not hear from our office in 1 week, please contact us.   We also encourage the use of MyChart, which contains your medical information for your review as well. If you are not enrolled in this feature, an access code is on this after visit summary for your convenience. Thank you for allowing [place] to be involved in your care.  It was great to see you today!  I hope you have a Merry Christmas and Happy Holidays!!    ---------------------------------------------------------------

## 2020-09-30 NOTE — Telephone Encounter (Signed)
OPENED BY MISTAKE

## 2020-09-30 NOTE — Progress Notes (Signed)
Referring Provider: Sharilyn Sites, MD Primary Care Physician:  Sharilyn Sites, MD Primary GI:  Dr. Abbey Chatters  Chief Complaint  Patient presents with  . Colonoscopy    HPI:   [name] is a 59 y.o. male who presents for follow-up to schedule colonoscopy.  The patient was last seen in our office 01/16/2020 for GERD, history of adenomas, dysphagia, Barrett's esophagus.  Previously he noted blood in stools, but his last visit no further blood.  Occasional dark stool, sees ENT and Valium has helped swallowing.  Pantoprazole is helping as well.  EGD surveillance for Barrett's esophagus up-to-date and next due in 2026.  Dysphagia symptoms doing well and recommended follow-up in 6 months.  Recommended repeat colonoscopy in November 2021.  Today he states he is doing okay overall. Swallowing continues to do better on Valium. GERD still doing well on Pantoprazole 40 mg bid. He is on Eliquis for s/p CABG and rethrombosis s/p stenting. Denies abdominal pain, N/V, hematochezia, melena, fever, chills, unintentional weight loss. Denies URI or flu-like symptoms. Denies loss of sense of taste or smell. The patient has not received COVID-19 vaccination(s). Denies chest pain, dyspnea, dizziness, lightheadedness, syncope, near syncope. Denies any other upper or lower GI symptoms.  He does occasionally have chest pain which resolves with NTG.   Past Medical History:  Diagnosis Date  . Abnormal myocardial perfusion study 01/01/2011   there a small to moderate sized inferobasal scar  . Barrett's esophagus   . Cataracts, bilateral   . Chronic kidney disease    hx of kidney stones  . Claudication ([place]) 11/16/2011   PV test perform shows normal  . COPD (chronic obstructive pulmonary disease) ([place])   . Depression   . Diabetes ([place])    type 2 diabetes mellitus  . Dysrhythmia   . GERD (gastroesophageal reflux disease)   . Glaucoma   . Hernia of abdominal wall   . HTN (hypertension)   . Hyperlipidemia    . Morbid obesity ([place])   . Myocardial infarction ([place]) 2008,2009,2009  . OSA (obstructive sleep apnea)    on cpap  . PAF (paroxysmal atrial fibrillation) ([place])   . S/P colonoscopy 2009   3-4 mm transverse colon erosions likely secondary to  ASA  . S/P endoscopy Dec 2011   moderate erosive gastritis, Barrett's esophagus 1-2cm  . Sleep apnea   . SOB (shortness of breath) 11/03/2007   2D Echo EF 50%-55%  . Stroke [facility])     Past Surgical History:  Procedure Laterality Date  . BIOPSY N/A 04/29/2015   Procedure: BIOPSY;  Surgeon: Danie Binder, MD;  Location: [facility] OR[facility];  Service: Endoscopy;  Laterality: N/A;  . BIOPSY  08/01/2018   Procedure: BIOPSY;  Surgeon: Danie Binder, MD;  Location: [facility];  Service: Endoscopy;;  esophageal  . CABG X 4  03/2008  . CARDIAC CATHETERIZATION  2009   stent placement to the left circumflex a 2.25   . CARDIAC CATHETERIZATION  07/08/2010  . CARDIAC CATHETERIZATION N/A 11/02/2019  . COLONOSCOPY WITH PROPOFOL N/A 09/27/2017   normal ileum, twenty 4 to 8 mm polyps in the sigmoid colon, descending colon, splenic flexure, transverse colon, ascending colon, cecum.  An additional three 2 to 4 mm polyps in the rectum and the descending colon.  Diverticulosis, internal hemorrhoids.  Surgical pathology found the polyps to be one fragment of hyperplastic polyp and 22 fragments of tubular adenoma..  Recommended repeat colonoscop  . CORONARY ARTERY BYPASS GRAFT  2008   4 vessels  . CORONARY STENT PLACEMENT  12/29/12  . CORONARY STENT PLACEMENT  12/2012  . ESOPHAGEAL DILATION N/A 04/29/2015   Procedure: ESOPHAGEAL DILATION 15 mm, 16 mm;  Surgeon: Danie Binder, MD;  Location: [facility];  Service: Endoscopy;  Laterality: N/A;  . ESOPHAGOGASTRODUODENOSCOPY  09/2011   Barrett's esophagus, no dysplasia on biopsies. Distal esophagitis. Status post dilation. Moderate gastritis and duodenitis, but biopsies benign. Next EGD in November 2015 for surveillance of Barrett's  esophagus.  . ESOPHAGOGASTRODUODENOSCOPY (EGD) WITH PROPOFOL N/A 04/29/2015   SLF: 1. Barretts esophagus 2. Moderate non-erosive gastritis.   Marland Kitchen ESOPHAGOGASTRODUODENOSCOPY (EGD) WITH PROPOFOL N/A 08/01/2018   Barrett's, repeat in 5 years. Empiric dilatation, mild gastritis  . HERNIA REPAIR     ventral hernia repair  . LEFT HEART CATHETERIZATION WITH CORONARY/GRAFT ANGIOGRAM N/A 12/29/2012   Procedure: LEFT HEART CATHETERIZATION WITH Beatrix Fetters;  Surgeon: Troy Sine, MD;  Location: [facility] CATH LAB;  Service: Cardiovascular;  Laterality: N/A;  . ORIF FIBULA FRACTURE Right 06/03/2015   Procedure: OPEN REDUCTION INTERNAL FIXATION (ORIF) DISTAL FIBULA  FRACTURE;  Surgeon: Garald Balding, MD;  Location: [place];  Service: Orthopedics;  Laterality: Right;  . PERCUTANEOUS CORONARY STENT INTERVENTION (PCI-S)  12/29/2012   Procedure: PERCUTANEOUS CORONARY STENT INTERVENTION (PCI-S);  Surgeon: Troy Sine, MD;  Location: [facility] C[facility] LAB;  Service: Cardiovascular;;  . POLYPECTOMY  09/27/2017   Procedure: POLYPECTOMY;  Surgeon: Danie Binder, MD;  [facility];  Service: Endoscopy;;  cecal polyp, ascending polyps x6, transverse colon polyps x6, splenic flexure polyps x2, descending colon polyps x6, sigmoid  colon polyp x1, rectal polyp x1   . SAVORY DILATION  09/06/2011   Procedure: SAVORY DILATION;  Surgeon: Dorothyann Peng, MD;  Location: [facility];  Service: Endoscopy;  Laterality: N/A;  Dilated with 77m  . SAVORY DILATION N/A 08/01/2018   Procedure: SAVORY DILATION;  Surgeon: FDanie Binder MD;  [facility];  Service: Endoscopy;  Laterality: N/A;    Current Outpatient Medications  Medication Sig Dispense Refill  . albuterol (VENTOLIN HFA) 108 (90 Base) MCG/ACT inhaler INHALE TWO PUFFS into THE lungs EVERY 6 HOURS AS NEEDED FOR SHORTNESS OF BREATH OR wheezing 8.5 g 3  . amLODipine (NORVASC) 10 MG tablet Take 10 mg by mouth at bedtime.    .Marland Kitchenapixaban (ELIQUIS) 5 MG TABS  tablet Take 1 tablet (5 mg total) by mouth 2 (two) times daily. 1 tablet 0  . blood glucose meter kit and supplies Dispense based on patient and insurance preference. Use up to two times daily as directed. (FOR ICD-E11.65) 1 each 5  . Continuous Blood Gluc Receiver (FREESTYLE LIBRE 2 READER) DEVI As directed 1 each 0  . Continuous Blood Gluc Sensor (FREESTYLE LIBRE 2 SENSOR) MISC 1 Piece by Does not apply route every 14 (fourteen) days. 2 each 3  . diazepam (VALIUM) 5 MG tablet Take 5 mg by mouth 2 (two) times daily.    .Marland Kitchenescitalopram (LEXAPRO) 20 MG tablet Take 20 mg daily by mouth.     . fenofibrate micronized (LOFIBRA) 134 MG capsule Take 134 mg by mouth at bedtime.    . Fluticasone-Umeclidin-Vilant (TRELEGY ELLIPTA) 100-62.5-25 MCG/INH AEPB Inhale 1 puff daily into the lungs.     . furosemide (LASIX) 80 MG tablet Take 80 mg by mouth daily.  4  . glipiZIDE (GLUCOTROL XL) 5 MG 24 hr tablet TAKE TWO TABLETS BY MOUTH EVERY MORNING WITH BREAKFAST 180  tablet 2  . Insulin Pen Needle (PEN NEEDLES) 31G X 8 MM MISC 1 each by Does not apply route 2 (two) times daily. Use as directed two times daily. 100 each 3  . insulin regular human CONCENTRATED (HUMULIN R U-500 KWIKPEN) 500 UNIT/ML kwikpen Inject 70 Units into the skin 3 (three) times daily with meals. Only when blood glucose is above 90 and eating. 42 mL 3  . isosorbide mononitrate (IMDUR) 60 MG 24 hr tablet Take 1 tablet (60 mg total) by mouth 3 (three) times daily. Please make an appointment for additional refills (Patient taking differently: Take 60 mg by mouth 2 (two) times daily. ) 45 tablet 0  . JANUVIA 50 MG tablet TAKE ONE TABLET BY MOUTH EVERY MORNING 90 tablet 0  . Lancets (ONETOUCH DELICA PLUS UTMLYY50P) MISC USE TO check blood sugar FOUR TIMES DAILY AS DIRECTED 200 each 1  . lisinopril (ZESTRIL) 10 MG tablet Take 1 tablet (10 mg total) by mouth daily. 90 tablet 1  . meclizine (ANTIVERT) 25 MG tablet Take 25 mg by mouth 2 (two) times daily.      . metoprolol tartrate (LOPRESSOR) 100 MG tablet Take 1 tablet (100 mg total) by mouth 2 (two) times daily. OFFICE VISIT NEEDED BEFORE ADDITIONAL REFILLS 28 tablet 0  . modafinil (PROVIGIL) 200 MG tablet Take 200 mg by mouth daily.    . nitroGLYCERIN (NITROLINGUAL) 0.4 MG/SPRAY spray Place 1 spray under the tongue every 5 (five) minutes x 3 doses as needed for chest pain. 12 g 0  . ONETOUCH VERIO test strip USE TO check blood sugar FOUR TIMES DAILY AS DIRECTED 200 strip 1  . pantoprazole (PROTONIX) 40 MG tablet TAKE ONE TABLET BY MOUTH TWICE DAILY BEFORE A meal 60 tablet 2  . potassium chloride SA (K-DUR,KLOR-CON) 20 MEQ tablet Take 1 tablet (20 mEq total) by mouth 2 (two) times daily. 30 tablet 0  . rosuvastatin (CRESTOR) 20 MG tablet Take 20 mg by mouth daily.      No current facility-administered medications for this visit.    Allergies as of 09/30/2020 - Review Complete 09/30/2020  Allergen Reaction Noted  . Contrast media [iodinated diagnostic agents] Other (See Comments) 03/14/2013  . Iohexol Other (See Comments) 01/02/2008    Family History  Problem Relation Age of Onset  . Diabetes Mother   . Heart disease Mother   . Diabetes Father   . Heart disease Father   . Pancreatic cancer Brother        age 65, doing well  . Colon cancer Neg Hx   . Anesthesia problems Neg Hx   . Hypotension Neg Hx   . Malignant hyperthermia Neg Hx   . Pseudochol deficiency Neg Hx     Social History   Socioeconomic History  . Marital status: Divorced    Spouse name: Not on file  . Number of children: Not on file  . Years of education: Not on file  . Highest education level: Not on file  Occupational History  . Not on file  Tobacco Use  . Smoking status: Current Every Day Smoker    Packs/day: 0.50    Years: 30.00    Pack years: 15.00    Types: Cigarettes  . Smokeless tobacco: Never Used  . Tobacco comment: smokes 1/2 pack per day 07/29/2020  Vaping Use  . Vaping Use: Never used    Substance and Sexual Activity  . Alcohol use: Yes    Comment: occ beer  . Drug  use: No  . Sexual activity: Never  Other Topics Concern  . Not on file  Social History Narrative  . Not on file   Social Determinants of Health   Financial Resource Strain:   . Difficulty of Paying Living Expenses: Not on file  Food Insecurity:   . Worried About Charity fundraiser in the Last Year: Not on file  . Ran Out of Food in the Last Year: Not on file  Transportation Needs:   . Lack of Transportation (Medical): Not on file  . Lack of Transportation (Non-Medical): Not on file  Physical Activity:   . Days of Exercise per Week: Not on file  . Minutes of Exercise per Session: Not on file  Stress:   . Feeling of Stress : Not on file  Social Connections:   . Frequency of Communication with Friends and Family: Not on file  . Frequency of Social Gatherings with Friends and Family: Not on file  . Attends Religious Services: Not on file  . Active Member of Clubs or Organizations: Not on file  . Attends Archivist Meetings: Not on file  . Marital Status: Not on file    Subjective: Review of Systems  Constitutional: Negative for chills, fever, malaise/fatigue and weight loss.  HENT: Negative for congestion and sore throat.   Respiratory: Negative for cough and shortness of breath.   Cardiovascular: Negative for chest pain and palpitations.  Gastrointestinal: Negative for abdominal pain, blood in stool, diarrhea, heartburn, melena, nausea and vomiting.  Musculoskeletal: Negative for joint pain and myalgias.  Skin: Negative for rash.  Neurological: Negative for dizziness and weakness.  Endo/Heme/Allergies: Does not bruise/bleed easily.  Psychiatric/Behavioral: Negative for depression. The patient is not nervous/anxious.   All other systems reviewed and are negative.    Objective: BP 91/69   Pulse 82   Temp (!) 97.1 F (36.2 C) (Temporal)   Ht _0  (1.778 m)   Wt (!) 309 lb  9.6 oz (140.4 kg)   BMI 44.42 kg/m  Physical Exam Vitals and nursing note reviewed.  Constitutional:      General: He is not in acute distress.    Appearance: Normal appearance. He is obese. He is not ill-appearing, toxic-appearing or diaphoretic.  HENT:     Head: Normocephalic and atraumatic.     Nose: No congestion or rhinorrhea.  Eyes:     General: No scleral icterus. Cardiovascular:     Rate and Rhythm: Normal rate and regular rhythm.     Heart sounds: Normal heart sounds.  Pulmonary:     Effort: Pulmonary effort is normal.     Breath sounds: Normal breath sounds.  Abdominal:     General: Bowel sounds are normal. There is no distension.     Palpations: Abdomen is soft. There is no hepatomegaly, splenomegaly or mass.     Tenderness: There is no abdominal tenderness. There is no guarding or rebound.     Hernia: No hernia is present.  Musculoskeletal:     Cervical back: Neck supple.  Skin:    General: Skin is warm and dry.     Coloration: Skin is not jaundiced.     Findings: No bruising or rash.  Neurological:     General: No focal deficit present.     Mental Status: He is alert and oriented to person, place, and time. Mental status is at baseline.  Psychiatric:        Mood and Affect: Mood normal.  Behavior: Behavior normal.        Thought Content: Thought content normal.      Assessment:  Very pleasant 59 year old male presents to schedule repeat colonoscopy that he is currently due for due to history of colon polyps.  He also has a history of GERD, dysphagia, Barrett's esophagus.  Generally he is doing okay overall.  His last hemoglobin A1c was unfortunately 11.0.  We will need to carefully check his preprocedure labs to ensure his blood sugar is appropriate for procedure.  He does have a history of coronary bypass and stenting currently on Eliquis.  We will need to contact cardiology for clearance to proceed with a colonoscopy and for the okay to hold his Eliquis  for 48 hours prior to his procedure.  Generally no red flag/warning signs or symptoms  Need for colonoscopy: We will ideally plan to schedule his colonoscopy soon as we can after we receive clearance from cardiology and the okay to hold his Eliquis for 48 hours.  We will also need to make adjustments to his diabetes medications as per below.  GERD with dysphagia and history of Barrett's: GERD currently well managed on Protonix 40 mg twice daily.  Dysphagia improved with ENT and prescription of Valium likely stress/anxiety induced.  He is not currently having any GERD or dysphagia symptoms.  He does have a history of Barrett's esophagus and explained to him that he will need to be on PPI indefinitely and he verbalized understanding.  He will call us with any worsening symptoms such as worsening swallowing difficulties or worsening GERD symptoms.   Proceed with TCS with Dr. Abbey Chatters on propofol/MAC in near future: the risks, benefits, and alternatives have been discussed with the patient in detail. The patient states understanding and desires to proceed.  ASA IV (history of CABG, coronary stenting, intermittent chest pain relieved with nitroglycerin all pending cardiology clearance)  The patient is currently on glipizide, Humulin (meal-based dosing), Januvia.  We will have him hold all of these the morning of his procedure.  He is also on Eliquis and we will get the permission to hold his Eliquis for 48 hours prior to his procedure.   Plan: 1. Contact cardiology for clearance 2. Contact cardiology with okay to hold Eliquis 3. Diabetes medications adjustments prior to was colonoscopy 4. Colonoscopy on propofol/MAC as per above 5. Follow-up in 1 year 6. Call for worsening or recurrent symptoms    Thank you for allowing [place] to participate in the care of Luiz Iron, DNP, AGNP-C Adult & Gerontological Nurse Practitioner [facility]   09/30/2020 3:41  PM   Disclaimer: This note was dictated with voice recognition software. Similar sounding words can inadvertently be transcribed and may not be corrected upon review.

## 2020-10-01 ENCOUNTER — Telehealth: Payer: Self-pay

## 2020-10-01 ENCOUNTER — Ambulatory Visit: Payer: Medicare HMO | Admitting: "Endocrinology

## 2020-10-01 ENCOUNTER — Encounter: Payer: Self-pay | Admitting: "Endocrinology

## 2020-10-01 VITALS — BP 101/67 | HR 87 | Ht 70.0 in | Wt 311.0 lb

## 2020-10-01 DIAGNOSIS — E782 Mixed hyperlipidemia: Secondary | ICD-10-CM

## 2020-10-01 DIAGNOSIS — E559 Vitamin D deficiency, unspecified: Secondary | ICD-10-CM

## 2020-10-01 DIAGNOSIS — E1159 Type 2 diabetes mellitus with other circulatory complications: Secondary | ICD-10-CM

## 2020-10-01 DIAGNOSIS — I10 Essential (primary) hypertension: Secondary | ICD-10-CM

## 2020-10-01 LAB — HEMOGLOBIN A1C: Hemoglobin A1C: 7.4

## 2020-10-01 MED ORDER — VITAMIN D (ERGOCALCIFEROL) 1.25 MG (50000 UNIT) PO CAPS: 50000.0000 [IU] | ORAL_CAPSULE | ORAL | 0 refills | Status: DC

## 2020-10-01 NOTE — Telephone Encounter (Signed)
Dr. Shirlee More,   Our mutual patient Ronnie Saavedra. [name] (DOB January 04, 1961) is needing medical clearance to have a colonoscopy. Is it ok for the patient to hold his Eliquis 48 hours prior to procedure and the medications: Glipizide, Humulin, and Januvia the morning of his procedure.    Thank you, Floria Raveling, CMA

## 2020-10-01 NOTE — Progress Notes (Signed)
10/01/2020  Endocrinology follow-up note   Subjective:    Patient ID: [name], male    DOB: [birth year].  he is being seen in follow-up  for management of currently uncontrolled symptomatic type 2 diabetes, hypertension, hyperlipidemia, obesity. PMD:  Sharilyn Sites, MD.   Past Medical History:  Diagnosis Date  . Abnormal myocardial perfusion study 01/01/2011   there a small to moderate sized inferobasal scar  . Barrett's esophagus   . Cataracts, bilateral   . Chronic kidney disease    hx of kidney stones  . Claudication ([place]) 11/16/2011   PV test perform shows normal  . COPD (chronic obstructive pulmonary disease) ([place])   . Depression   . Diabetes ([place])    type 2 diabetes mellitus  . Dysrhythmia   . GERD (gastroesophageal reflux disease)   . Glaucoma   . Hernia of abdominal wall   . HTN (hypertension)   . Hyperlipidemia   . Morbid obesity ([place])   . Myocardial infarction ([place]) 2008,2009,2009  . OSA (obstructive sleep apnea)    on cpap  . PAF (paroxysmal atrial fibrillation) ([place])   . S/P colonoscopy 2009   3-4 mm transverse colon erosions likely secondary to  ASA  . S/P endoscopy Dec 2011   moderate erosive gastritis, Barrett's esophagus 1-2cm  . Sleep apnea   . SOB (shortness of breath) 11/03/2007   2D Echo EF 50%-55%  . Stroke [facility])    Past Surgical History:  Procedure Laterality Date  . BIOPSY N/A 04/29/2015   Procedure: BIOPSY;  Surgeon: Danie Binder, MD;  Location: [facility] ORS;  Service: Endoscopy;  Laterality: N/A;  . BIOPSY  08/01/2018   Procedure: BIOPSY;  Surgeon: Danie Binder, MD;  Location: [facility] END[facility] SUITE;  Service: Endoscopy;;  esophageal  . CABG X 4  03/2008  . CARDIAC CATHETERIZATION  2009   stent placement to the left circumflex a 2.25   . CARDIAC CATHETERIZATION  07/08/2010  . CARDIAC CATHETERIZATION N/A 11/02/2019  . COLONOSCOPY WITH PROPOFOL N/A 09/27/2017   normal ileum, twenty 4 to 8 mm polyps in the sigmoid colon, descending colon,  splenic flexure, transverse colon, ascending colon, cecum.  An additional three 2 to 4 mm polyps in the rectum and the descending colon.  Diverticulosis, internal hemorrhoids.  Surgical pathology found the polyps to be one fragment of hyperplastic polyp and 22 fragments of tubular adenoma..  Recommended repeat colonoscop  . CORONARY ARTERY BYPASS GRAFT  2008   4 vessels  . CORONARY STENT PLACEMENT  12/29/12  . CORONARY STENT PLACEMENT  12/2012  . ESOPHAGEAL DILATION N/A 04/29/2015   Procedure: ESOPHAGEAL DILATION 15 mm, 16 mm;  Surgeon: Danie Binder, MD;  Location: [facility];  Service: Endoscopy;  Laterality: N/A;  . ESOPHAGOGASTRODUODENOSCOPY  09/2011   Barrett's esophagus, no dysplasia on biopsies. Distal esophagitis. Status post dilation. Moderate gastritis and duodenitis, but biopsies benign. Next EGD in November 2015 for surveillance of Barrett's esophagus.  . ESOPHAGOGASTRODUODENOSCOPY (EGD) WITH PROPOFOL N/A 04/29/2015   SLF: 1. Barretts esophagus 2. Moderate non-erosive gastritis.   Marland Kitchen ESOPHAGOGASTRODUODENOSCOPY (EGD) WITH PROPOFOL N/A 08/01/2018   Barrett's, repeat in 5 years. Empiric dilatation, mild gastritis  . HERNIA REPAIR     ventral hernia repair  . LEFT HEART CATHETERIZATION WITH CORONARY/GRAFT ANGIOGRAM N/A 12/29/2012   Procedure: LEFT HEART CATHETERIZATION WITH Beatrix Fetters;  Surgeon: Troy Sine, MD;  Location: [facility] CATH LAB;  Service: Cardiovascular;  Laterality: N/A;  . ORIF FIBULA FRACTURE Right 06/03/2015  Procedure: OPEN REDUCTION INTERNAL FIXATION (ORIF) DISTAL FIBULA  FRACTURE;  Surgeon: Garald Balding, MD;  Location: [facility];  Service: Orthopedics;  Laterality: Right;  . PERCUTANEOUS CORONARY STENT INTERVENTION (PCI-S)  12/29/2012   Procedure: PERCUTANEOUS CORONARY STENT INTERVENTION (PCI-S);  Surgeon: Troy Sine, MD;  Location: [facility] CATH LAB;  Service: Cardiovascular;;  . POLYPECTOMY  09/27/2017   Procedure: POLYPECTOMY;  Surgeon: Danie Binder, MD;   [facility];  Service: Endoscopy;;  cecal polyp, ascending polyps x6, transverse colon polyps x6, splenic flexure polyps x2, descending colon polyps x6, sigmoid  colon polyp x1, rectal polyp x1   . SAVORY DILATION  09/06/2011   Procedure: SAVORY DILATION;  Surgeon: Dorothyann Peng, MD;  Location: [facility];  Service: Endoscopy;  Laterality: N/A;  Dilated with 56m  . SAVORY DILATION N/A 08/01/2018   Procedure: SAVORY DILATION;  Surgeon: FDanie Binder MD;  Location[facility];  Service: Endoscopy;  Laterality: N/A;   Social History   Socioeconomic History  . Marital status: Divorced    Spouse name: Not on file  . Number of children: Not on file  . Years of education: Not on file  . Highest education level: Not on file  Occupational History  . Not on file  Tobacco Use  . Smoking status: Current Every Day Smoker    Packs/day: 0.50    Years: 30.00    Pack years: 15.00    Types: Cigarettes  . Smokeless tobacco: Never Used  . Tobacco comment: smokes 1/2 pack per day 07/29/2020  Vaping Use  . Vaping Use: Never used  Substance and Sexual Activity  . Alcohol use: Yes    Comment: occ beer  . Drug use: No  . Sexual activity: Never  Other Topics Concern  . Not on file  Social History Narrative  . Not on file   Social Determinants of Health   Financial Resource Strain:   . Difficulty of Paying Living Expenses: Not on file  Food Insecurity:   . Worried About RCharity fundraiserin the Last Year: Not on file  . Ran Out of Food in the Last Year: Not on file  Transportation Needs:   . Lack of Transportation (Medical): Not on file  . Lack of Transportation (Non-Medical): Not on file  Physical Activity:   . Days of Exercise per Week: Not on file  . Minutes of Exercise per Session: Not on file  Stress:   . Feeling of Stress : Not on file  Social Connections:   . Frequency of Communication with Friends and Family: Not on file  . Frequency of Social Gatherings with Friends  and Family: Not on file  . Attends Religious Services: Not on file  . Active Member of Clubs or Organizations: Not on file  . Attends CArchivistMeetings: Not on file  . Marital Status: Not on file   Outpatient Encounter Medications as of 10/01/2020  Medication Sig  . albuterol (VENTOLIN HFA) 108 (90 Base) MCG/ACT inhaler INHALE TWO PUFFS into THE lungs EVERY 6 HOURS AS NEEDED FOR SHORTNESS OF BREATH OR wheezing  . amLODipine (NORVASC) 10 MG tablet Take 10 mg by mouth at bedtime.  .Marland Kitchenapixaban (ELIQUIS) 5 MG TABS tablet Take 1 tablet (5 mg total) by mouth 2 (two) times daily.  . blood glucose meter kit and supplies Dispense based on patient and insurance preference. Use up to two times daily as directed. (FOR ICD-E11.65)  . Continuous Blood  Gluc Receiver (FREESTYLE LIBRE 2 READER) DEVI As directed  . Continuous Blood Gluc Sensor (FREESTYLE LIBRE 2 SENSOR) MISC 1 Piece by Does not apply route every 14 (fourteen) days.  . diazepam (VALIUM) 5 MG tablet Take 5 mg by mouth 2 (two) times daily.  Marland Kitchen escitalopram (LEXAPRO) 20 MG tablet Take 20 mg daily by mouth.   . fenofibrate micronized (LOFIBRA) 134 MG capsule Take 134 mg by mouth at bedtime.  . Fluticasone-Umeclidin-Vilant (TRELEGY ELLIPTA) 100-62.5-25 MCG/INH AEPB Inhale 1 puff daily into the lungs.   . furosemide (LASIX) 80 MG tablet Take 80 mg by mouth daily.  Marland Kitchen glipiZIDE (GLUCOTROL XL) 5 MG 24 hr tablet TAKE TWO TABLETS BY MOUTH EVERY MORNING WITH BREAKFAST  . Insulin Pen Needle (PEN NEEDLES) 31G X 8 MM MISC 1 each by Does not apply route 2 (two) times daily. Use as directed two times daily.  . insulin regular human CONCENTRATED (HUMULIN R U-500 KWIKPEN) 500 UNIT/ML kwikpen Inject 70 Units into the skin 3 (three) times daily with meals. Only when blood glucose is above 90 and eating.  . isosorbide mononitrate (IMDUR) 60 MG 24 hr tablet Take 1 tablet (60 mg total) by mouth 3 (three) times daily. Please make an appointment for additional  refills (Patient taking differently: Take 60 mg by mouth 2 (two) times daily. )  . JANUVIA 50 MG tablet TAKE ONE TABLET BY MOUTH EVERY MORNING  . Lancets (ONETOUCH DELICA PLUS [id number]) MISC USE TO check blood sugar FOUR TIMES DAILY AS DIRECTED  . lisinopril (ZESTRIL) 10 MG tablet Take 1 tablet (10 mg total) by mouth daily.  . meclizine (ANTIVERT) 25 MG tablet Take 25 mg by mouth 2 (two) times daily.   . metoprolol tartrate (LOPRESSOR) 100 MG tablet Take 1 tablet (100 mg total) by mouth 2 (two) times daily. OFFICE VISIT NEEDED BEFORE ADDITIONAL REFILLS  . modafinil (PROVIGIL) 200 MG tablet Take 200 mg by mouth daily.  . nitroGLYCERIN (NITROLINGUAL) 0.4 MG/SPRAY spray Place 1 spray under the tongue every 5 (five) minutes x 3 doses as needed for chest pain.  Glory Rosebush VERIO test strip USE TO check blood sugar FOUR TIMES DAILY AS DIRECTED  . pantoprazole (PROTONIX) 40 MG tablet TAKE ONE TABLET BY MOUTH TWICE DAILY BEFORE A meal  . potassium chloride SA (K-DUR,KLOR-CON) 20 MEQ tablet Take 1 tablet (20 mEq total) by mouth 2 (two) times daily.  . rosuvastatin (CRESTOR) 20 MG tablet Take 20 mg by mouth daily.   . Vitamin D, Ergocalciferol, (DRISDOL) 1.25 MG (50000 UNIT) CAPS capsule Take 1 capsule (50,000 Units total) by mouth every 7 (seven) days.   No facility-administered encounter medications on file as of 10/01/2020.    ALLERGIES: Allergies  Allergen Reactions  . Contrast Media [Iodinated Diagnostic Agents] Other (See Comments)    Pt must be premedicated before given contrast media - stops heart  . Iohexol Other (See Comments)     Consult with radiologist before pre meds are given.Desc: PT. STATES HEART STOPPED HAS TO BE PREMED.     VACCINATION STATUS: Immunization History  Administered Date(s) Administered  . Influenza,inj,Quad PF,6+ Mos 09/22/2020    Diabetes He presents for his follow-up diabetic visit. He has type 2 diabetes mellitus. Onset time: He was diagnosed at approximate  age of 53 years. His disease course has been worsening. There are no hypoglycemic associated symptoms. Pertinent negatives for hypoglycemia include no confusion, headaches, pallor or seizures. Associated symptoms include polydipsia and polyuria. Pertinent negatives for diabetes include  no blurred vision, no chest pain, no fatigue, no polyphagia and no weakness. There are no hypoglycemic complications. Symptoms are worsening. Diabetic complications include a CVA and heart disease. Risk factors for coronary artery disease include diabetes mellitus, dyslipidemia, hypertension, male sex, obesity, tobacco exposure, sedentary lifestyle and family history. Current diabetic treatments: He was supposed to be on basal insulin, unfortunately he ran out and could not afford refills and he did not call clinic for alternatives. He is compliant with treatment none of the time. His weight is fluctuating minimally. He is following a generally unhealthy diet. When asked about meal planning, he reported none. He has not had a previous visit with a dietitian. He never participates in exercise. His home blood glucose trend is decreasing steadily. His breakfast blood glucose range is generally 140-180 mg/dl. His lunch blood glucose range is generally 140-180 mg/dl. His dinner blood glucose range is generally 180-200 mg/dl. His bedtime blood glucose range is generally 180-200 mg/dl. His overall blood glucose range is 180-200 mg/dl. (He presents with significantly improved glycemic profile.  No hypoglycemia.  His point-of-care A1c is improving to 7.4% from 11%.  ) An ACE inhibitor/angiotensin II receptor blocker is being taken. He does not see a podiatrist.Eye exam is not current.  Hyperlipidemia This is a chronic problem. The current episode started more than 1 year ago. The problem is uncontrolled. Recent lipid tests were reviewed and are high. Exacerbating diseases include diabetes and obesity. Pertinent negatives include no chest  pain, myalgias or shortness of breath. Current antihyperlipidemic treatment includes statins. Compliance problems include medication cost and psychosocial issues.  Risk factors for coronary artery disease include dyslipidemia, diabetes mellitus, hypertension, male sex, obesity, family history and a sedentary lifestyle.  Hypertension This is a chronic problem. The current episode started more than 1 year ago. The problem is uncontrolled. Pertinent negatives include no blurred vision, chest pain, headaches, neck pain, palpitations or shortness of breath. Risk factors for coronary artery disease include diabetes mellitus, dyslipidemia, male gender, obesity, family history, sedentary lifestyle and smoking/tobacco exposure. Past treatments include ACE inhibitors. Hypertensive end-organ damage includes CVA.     Review of systems  Constitutional: + Minimally fluctuating body weight,  current  Body mass index is 44.62 kg/m. , no fatigue, no subjective hyperthermia, no subjective hypothermia Eyes: no blurry vision, no xerophthalmia ENT: no sore throat, no nodules palpated in throat, no dysphagia/odynophagia, no hoarseness Cardiovascular: no Chest Pain, no Shortness of Breath, no palpitations, no leg swelling Respiratory: + cough, + exertional shortness of breath related to COPD. Gastrointestinal: no Nausea/Vomiting/Diarhhea Musculoskeletal: no muscle/joint aches Skin: no rashes, no hyperemia Neurological: no tremors, no numbness, no tingling, no dizziness Psychiatric: no depression, no anxiety   Objective:    BP 101/67   Pulse 87   Ht '5\' 10"'  (1.778 m)   Wt (!) 311 lb (141.1 kg)   BMI 44.62 kg/m   Wt Readings from Last 3 Encounters:  10/01/20 (!) 311 lb (141.1 kg)  09/30/20 (!) 309 lb 9.6 oz (140.4 kg)  09/22/20 (!) 310 lb (140.6 kg)      Physical Exam- Limited  Constitutional:  Body mass index is 44.62 kg/m. , not in acute distress, normal state of mind Eyes:  EOMI, no  exophthalmos Neck: Supple Thyroid: No gross goiter Respiratory: Adequate breathing efforts Musculoskeletal: no gross deformities, strength intact in all four extremities, no gross restriction of joint movements Skin:  no rashes, no hyperemia Neurological: no tremor with outstretched hands   CMP ( most  recent) CMP     Component Value Date/Time   NA 136 05/09/2020 1452   K 3.7 05/09/2020 1452   CL 96 05/09/2020 1452   CO2 23 05/09/2020 1452   GLUCOSE 571 (HH) 05/09/2020 1452   GLUCOSE 209 (H) 01/30/2019 1320   BUN 7 05/09/2020 1452   CREATININE 0.96 05/09/2020 1452   CALCIUM 9.8 05/09/2020 1452   PROT 6.6 05/09/2020 1452   ALBUMIN 4.3 05/09/2020 1452   AST 22 05/09/2020 1452   ALT 33 05/09/2020 1452   ALKPHOS 106 05/09/2020 1452   BILITOT 0.3 05/09/2020 1452   GFRNONAA 87 05/09/2020 1452   GFRAA 101 05/09/2020 1452   Diabetic Labs (most recent): Lab Results  Component Value Date   HGBA1C 7.4 10/01/2020   HGBA1C 11.0 (A) 06/26/2020   HGBA1C 9.7 02/11/2020     Lipid Panel ( most recent) Lipid Panel     Component Value Date/Time   CHOL 132 07/03/2019 1324   TRIG 315 (H) 07/03/2019 1324   HDL 30 (L) 07/03/2019 1324   CHOLHDL 6.1 04/28/2014 0121   VLDL 61 (H) 04/28/2014 0121   LDLCALC 53 07/03/2019 1324     Assessment & Plan:   1. DM type 2 causing vascular disease ([place])  - Patient has currently uncontrolled symptomatic type 2 DM since  59 years of age. He presents with significantly improved glycemic profile.  No hypoglycemia.  His point-of-care A1c is improving to 7.4% from 11%.  -his diabetes is complicated by coronary artery disease, CVA, obesity/sedentary life, chronic heavy smoking and [name] remains at extremely high risk  for more acute and chronic complications which include CAD, CVA, CKD, retinopathy, and neuropathy. These are all discussed in detail with the patient.  - I have counseled him on diet management and weight loss, by adopting a  carbohydrate restricted/protein rich diet.  - he  admits there is a room for improvement in his diet and drink choices. -  Suggestion is made for him to avoid simple carbohydrates  from his diet including Cakes, Sweet Desserts / Pastries, Ice Cream, Soda (diet and regular), Sweet Tea, Candies, Chips, Cookies, Sweet Pastries,  Store Bought Juices, Alcohol in Excess of  1-2 drinks a day, Artificial Sweeteners, Coffee Creamer, and "Sugar-free" Products. This will help patient to have stable blood glucose profile and potentially avoid unintended weight gain.   - I encouraged him to switch to  unprocessed or minimally processed complex starch and increased protein intake (animal or plant source), fruits, and vegetables.  - he is advised to stick to a routine mealtimes to eat 3 meals  a day and avoid unnecessary snacks ( to snack only to correct hypoglycemia).   - I have approached him with the following individualized plan to manage diabetes and patient agrees:   - He is responding to the U500 insulin favorably.   -Priority is still to avoid hypoglycemia in this patient.    -He is advised to continue insulin U50 0 to 70 units   3 times daily AC for Premeal blood glucose readings above 90 mg/dl, associated with strict monitoring of blood glucose 4 times a day-before meals and at bedtime. -He is benefiting from the CGM device.  He is advised to continue to wear it at all times.  His most recent CBG printout shows 43% time in range, 50% above range.  He did have 10% mild hypoglycemia, mainly due to missed meals.  -He has benefited from glipizide treatment.  He is  advised to continue glipizide 1 mg XL p.o. daily at breakfast.   - He does not tolerate metformin due to GI side effects.   -Patient is encouraged to call clinic for blood glucose levels less than 70 or above 300 mg /dl.  -He is advised to continue Januvia 50 mg p.o. daily with breakfast.  -Incretin therapy will be administered with great  caution in this patient with hypertriglyceridemia, and chronic heavy smoking.    - Patient specific target  A1c;  LDL, HDL, Triglycerides,  were discussed in detail.  2) BP/HTN:   -His blood pressure is controlled to target. The patient was counseled on the dangers of tobacco use, and was advised to quit.  Reviewed strategies to maximize success, including removing cigarettes and smoking materials from environment.  -I recently increase his lisinopril to 10 mg daily.      3) Lipids/HPL: His  lipid panel is not controlled  with severe hypertriglyceridemia   of 315 improving from 469.  He is advised to continue Trilipix 135 mg p.o. nightly.    4)  Weight/Diet: His BMI is 08.6 -clearly complicating his diabetes care.  He is a candidate for modest weight loss.  CDE Consult has been  initiated , exercise, and detailed carbohydrates information provided.  5) vitamin D deficiency: He is on ongoing supplement with vitamin D2 50,000 units weekly for 12 weeks.  6) Chronic Care/Health Maintenance:  -he  is on ACEI/ARB and Statin medications and  is encouraged to continue to follow up with Ophthalmology, Dentist,  Podiatrist at least yearly or according to recommendations, and advised to  quit smoking. I have recommended yearly flu vaccine and pneumonia vaccination at least every 5 years; moderate intensity exercise for up to 150 minutes weekly; and  sleep for at least 7 hours a day.  The patient was counseled on the dangers of tobacco use, and was advised to quit.  Reviewed strategies to maximize success, including removing cigarettes and smoking materials from environment.    - I advised patient to maintain close follow up with Sharilyn Sites, MD for primary care needs.  - Time spent on this patient care encounter:  35 min, of which > 50% was spent in  counseling and the rest reviewing his blood glucose logs , discussing his hypoglycemia and hyperglycemia episodes, reviewing his current and  previous  labs / studies  ( including abstraction from other facilities) and medications  doses and developing a  long term treatment plan and documenting his care.   Please refer to Patient Instructions for Blood Glucose Monitoring and Insulin/Medications Dosing Guide"  in media tab for additional information. Please  also refer to " Patient Self Inventory" in the Media  tab for reviewed elements of pertinent patient history.  [name] participated in the discussions, expressed understanding, and voiced agreement with the above plans.  All questions were answered to his satisfaction. he is encouraged to contact clinic should he have any questions or concerns prior to his return visit.    Follow up plan: - Return in about 4 months (around 01/30/2021) for F/U with Pre-visit Labs, Meter, Logs, A1c here.Glade Lloyd, MD Phone: [phone]  Fax: [phone]   10/01/2020, 2:18 PM   This note was partially dictated with voice recognition software. Similar sounding words can be transcribed inadequately or may not  be corrected upon review.

## 2020-10-01 NOTE — Patient Instructions (Signed)

## 2020-10-01 NOTE — Progress Notes (Signed)
Cc'ed to pcp °

## 2020-10-06 ENCOUNTER — Telehealth: Payer: Self-pay | Admitting: Internal Medicine

## 2020-10-06 ENCOUNTER — Telehealth: Payer: Self-pay

## 2020-10-06 NOTE — Telephone Encounter (Signed)
Pre-op/covid test 11/07/20. Appt letter mailed with procedure instructions.

## 2020-10-06 NOTE — Telephone Encounter (Signed)
See other phone note from today.

## 2020-10-06 NOTE — Telephone Encounter (Signed)
Returning call. [phone]

## 2020-10-06 NOTE — Telephone Encounter (Signed)
Tried to call pt, LMOVM for return call. 

## 2020-10-06 NOTE — Telephone Encounter (Signed)
Spoke to pt, TCS w/Dr. Abbey Chatters ASA 4 scheduled for 11/11/20 at 12:45pm. Informed pt to hold Eliquis for 48 hours before procedure and he had been cleared by cardiology for procedure. Orders entered.

## 2020-10-06 NOTE — Telephone Encounter (Signed)
Pt. [name] has been cleared to have a colonoscopy.

## 2020-10-07 NOTE — Telephone Encounter (Signed)
Noted, thanks!

## 2020-10-21 ENCOUNTER — Telehealth: Payer: Self-pay | Admitting: Internal Medicine

## 2020-10-21 NOTE — Telephone Encounter (Signed)
Called pt, confirmed he wants to cancel TCS for now. Advised him to let our office know when he's ready to proceed with TCS. Endo scheduler informed.  FYI to Walden Field NP.

## 2020-10-21 NOTE — Telephone Encounter (Signed)
PATIENT NEEDS TO CANCEL PROCEDURE FOR NOW, MOTHER GOING TO HOSPICE

## 2020-10-21 NOTE — Telephone Encounter (Signed)
Noted  

## 2020-10-25 DIAGNOSIS — G4733 Obstructive sleep apnea (adult) (pediatric): Secondary | ICD-10-CM | POA: Diagnosis not present

## 2020-10-25 DIAGNOSIS — J449 Chronic obstructive pulmonary disease, unspecified: Secondary | ICD-10-CM | POA: Diagnosis not present

## 2020-10-28 ENCOUNTER — Other Ambulatory Visit: Payer: Self-pay | Admitting: "Endocrinology

## 2020-10-30 ENCOUNTER — Telehealth: Payer: Self-pay | Admitting: "Endocrinology

## 2020-10-30 NOTE — Telephone Encounter (Signed)
Pt left a voicemail stating he received a message about new prescriptions that were called in for him to review. Pt would like to speak to nurse in regards to this.

## 2020-10-30 NOTE — Telephone Encounter (Signed)
Called pt to find out what medication he received notification of, pt was unable to give the name. Discussed with pt we sent in a refill for his Trueplus pen needles to [facility] on the 28th and that he may be receiving notification of that Rx and to check with the pharmacy. Understanding voiced per pt.

## 2020-10-30 NOTE — Telephone Encounter (Signed)
Pt returned call. You were with pt. Please call back when you can

## 2020-10-30 NOTE — Telephone Encounter (Signed)
Left a message requesting a return call to the office. 

## 2020-10-31 DIAGNOSIS — E1159 Type 2 diabetes mellitus with other circulatory complications: Secondary | ICD-10-CM | POA: Diagnosis not present

## 2020-10-31 DIAGNOSIS — I10 Essential (primary) hypertension: Secondary | ICD-10-CM | POA: Diagnosis not present

## 2020-10-31 DIAGNOSIS — E1165 Type 2 diabetes mellitus with hyperglycemia: Secondary | ICD-10-CM | POA: Diagnosis not present

## 2020-10-31 DIAGNOSIS — J449 Chronic obstructive pulmonary disease, unspecified: Secondary | ICD-10-CM | POA: Diagnosis not present

## 2020-11-06 ENCOUNTER — Telehealth: Payer: Self-pay | Admitting: Internal Medicine

## 2020-11-06 ENCOUNTER — Encounter: Payer: Self-pay | Admitting: *Deleted

## 2020-11-06 MED ORDER — PEG 3350-KCL-NA BICARB-NACL 420 G PO SOLR: ORAL | 0 refills | Status: DC

## 2020-11-06 NOTE — Telephone Encounter (Signed)
Pt called to reschedule his colonoscopy with Dr Abbey Chatters. [phone]

## 2020-11-06 NOTE — Telephone Encounter (Signed)
Called pt. He has been rescheduled to 2/22 at 11:30am. Aware will mail prep instructions with covid/pre-op appt. Confirmed mailing address. Rx sent to pharmacy.

## 2020-11-07 ENCOUNTER — Encounter (HOSPITAL_COMMUNITY): Payer: Medicare HMO

## 2020-11-07 ENCOUNTER — Other Ambulatory Visit (HOSPITAL_COMMUNITY): Payer: Medicare HMO

## 2020-11-11 ENCOUNTER — Ambulatory Visit (HOSPITAL_COMMUNITY): Admit: 2020-11-11 | Payer: Medicare HMO

## 2020-11-11 ENCOUNTER — Encounter (HOSPITAL_COMMUNITY): Payer: Self-pay

## 2020-11-11 SURGERY — COLONOSCOPY WITH PROPOFOL
Anesthesia: Monitor Anesthesia Care

## 2020-11-17 ENCOUNTER — Telehealth: Payer: Self-pay | Admitting: "Endocrinology

## 2020-11-17 NOTE — Telephone Encounter (Signed)
Caren Griffins is calling she works with the [place] team for [facility] and said that they faxed over information for pt assistance and wants to confirm if we have received this. She can be reached at [phone]

## 2020-11-17 NOTE — Telephone Encounter (Signed)
Left a message for Caren Griffins at [facility] that we are unable to check if we received their fax due to having to work remotely due to inclement weather but as soon as we were able to go into the office we would check to make sure we did get the fax.

## 2020-11-20 NOTE — Telephone Encounter (Signed)
Left a message for Caren Griffins at [facility] requesting she refax pt's pt assistance forms.

## 2020-11-20 NOTE — Telephone Encounter (Signed)
Caren Griffins called back in regards to this. [phone]

## 2020-11-21 NOTE — Telephone Encounter (Signed)
Faxed Paperwork to [employer].

## 2020-11-21 NOTE — Telephone Encounter (Signed)
Placed forms in nurse box.

## 2020-11-24 DIAGNOSIS — G4733 Obstructive sleep apnea (adult) (pediatric): Secondary | ICD-10-CM | POA: Diagnosis not present

## 2020-11-24 DIAGNOSIS — J449 Chronic obstructive pulmonary disease, unspecified: Secondary | ICD-10-CM | POA: Diagnosis not present

## 2020-11-28 ENCOUNTER — Other Ambulatory Visit: Payer: Self-pay | Admitting: Gastroenterology

## 2020-12-01 DIAGNOSIS — I10 Essential (primary) hypertension: Secondary | ICD-10-CM | POA: Diagnosis not present

## 2020-12-01 DIAGNOSIS — E1159 Type 2 diabetes mellitus with other circulatory complications: Secondary | ICD-10-CM | POA: Diagnosis not present

## 2020-12-01 DIAGNOSIS — J449 Chronic obstructive pulmonary disease, unspecified: Secondary | ICD-10-CM | POA: Diagnosis not present

## 2020-12-01 DIAGNOSIS — E1165 Type 2 diabetes mellitus with hyperglycemia: Secondary | ICD-10-CM | POA: Diagnosis not present

## 2020-12-04 DIAGNOSIS — F17210 Nicotine dependence, cigarettes, uncomplicated: Secondary | ICD-10-CM | POA: Diagnosis not present

## 2020-12-04 DIAGNOSIS — Z1389 Encounter for screening for other disorder: Secondary | ICD-10-CM | POA: Diagnosis not present

## 2020-12-04 DIAGNOSIS — E1151 Type 2 diabetes mellitus with diabetic peripheral angiopathy without gangrene: Secondary | ICD-10-CM | POA: Diagnosis not present

## 2020-12-04 DIAGNOSIS — I10 Essential (primary) hypertension: Secondary | ICD-10-CM | POA: Diagnosis not present

## 2020-12-04 DIAGNOSIS — R69 Illness, unspecified: Secondary | ICD-10-CM | POA: Diagnosis not present

## 2020-12-04 DIAGNOSIS — Z6841 Body Mass Index (BMI) 40.0 and over, adult: Secondary | ICD-10-CM | POA: Diagnosis not present

## 2020-12-04 DIAGNOSIS — E7849 Other hyperlipidemia: Secondary | ICD-10-CM | POA: Diagnosis not present

## 2020-12-04 DIAGNOSIS — Z1331 Encounter for screening for depression: Secondary | ICD-10-CM | POA: Diagnosis not present

## 2020-12-04 DIAGNOSIS — G4733 Obstructive sleep apnea (adult) (pediatric): Secondary | ICD-10-CM | POA: Diagnosis not present

## 2020-12-04 DIAGNOSIS — Z719 Counseling, unspecified: Secondary | ICD-10-CM | POA: Diagnosis not present

## 2020-12-04 LAB — HEPATIC FUNCTION PANEL
ALT: 23 (ref 10–40)
AST: 15 (ref 14–40)
Alkaline Phosphatase: 84 (ref 25–125)
Bilirubin, Total: 0.4

## 2020-12-04 LAB — COMPREHENSIVE METABOLIC PANEL
Albumin: 4.4 (ref 3.5–5.0)
Calcium: 9.7 (ref 8.7–10.7)
GFR calc Af Amer: 93
GFR calc non Af Amer: 80
Globulin: 2.6

## 2020-12-04 LAB — BASIC METABOLIC PANEL
BUN: 11 (ref 4–21)
CO2: 23 — AB (ref 13–22)
Chloride: 99 (ref 99–108)
Creatinine: 1 (ref 0.6–1.3)
Glucose: 206
Potassium: 4.2 (ref 3.4–5.3)
Sodium: 137 (ref 137–147)

## 2020-12-04 LAB — LIPID PANEL
Cholesterol: 149 (ref 0–200)
HDL: 30 — AB (ref 35–70)
LDL Cholesterol: 80
Triglycerides: 230 — AB (ref 40–160)

## 2020-12-15 ENCOUNTER — Telehealth: Payer: Self-pay | Admitting: *Deleted

## 2020-12-15 DIAGNOSIS — G4714 Hypersomnia due to medical condition: Secondary | ICD-10-CM | POA: Diagnosis not present

## 2020-12-15 DIAGNOSIS — G4710 Hypersomnia, unspecified: Secondary | ICD-10-CM | POA: Diagnosis not present

## 2020-12-15 DIAGNOSIS — G4733 Obstructive sleep apnea (adult) (pediatric): Secondary | ICD-10-CM | POA: Diagnosis not present

## 2020-12-15 DIAGNOSIS — I10 Essential (primary) hypertension: Secondary | ICD-10-CM | POA: Diagnosis not present

## 2020-12-15 DIAGNOSIS — Z79899 Other long term (current) drug therapy: Secondary | ICD-10-CM | POA: Diagnosis not present

## 2020-12-15 NOTE — Telephone Encounter (Signed)
Received call from patient that his blood sugars are still running 230-400. His procedure was cancelled last time due to high blood sugars. I advised he needs to contact Dr. Liliane Channel office making them aware of his blood sugars. Procedure cancelled for now until his blood sugars are better under control so procedure doesn't get cancelled. FYI to eric

## 2020-12-17 NOTE — Telephone Encounter (Signed)
Noted  

## 2020-12-19 ENCOUNTER — Other Ambulatory Visit (HOSPITAL_COMMUNITY): Payer: Medicare HMO

## 2020-12-19 ENCOUNTER — Encounter (HOSPITAL_COMMUNITY): Admission: RE | Admit: 2020-12-19 | Payer: Medicare HMO | Source: Ambulatory Visit

## 2020-12-23 ENCOUNTER — Encounter (HOSPITAL_COMMUNITY): Payer: Self-pay

## 2020-12-23 ENCOUNTER — Ambulatory Visit (HOSPITAL_COMMUNITY): Admit: 2020-12-23 | Payer: Medicare HMO

## 2020-12-23 SURGERY — COLONOSCOPY WITH PROPOFOL
Anesthesia: Monitor Anesthesia Care

## 2020-12-25 DIAGNOSIS — J449 Chronic obstructive pulmonary disease, unspecified: Secondary | ICD-10-CM | POA: Diagnosis not present

## 2020-12-25 DIAGNOSIS — G4733 Obstructive sleep apnea (adult) (pediatric): Secondary | ICD-10-CM | POA: Diagnosis not present

## 2020-12-29 DIAGNOSIS — E1165 Type 2 diabetes mellitus with hyperglycemia: Secondary | ICD-10-CM | POA: Diagnosis not present

## 2020-12-29 DIAGNOSIS — I10 Essential (primary) hypertension: Secondary | ICD-10-CM | POA: Diagnosis not present

## 2020-12-29 DIAGNOSIS — J449 Chronic obstructive pulmonary disease, unspecified: Secondary | ICD-10-CM | POA: Diagnosis not present

## 2021-01-19 DIAGNOSIS — E1159 Type 2 diabetes mellitus with other circulatory complications: Secondary | ICD-10-CM | POA: Diagnosis not present

## 2021-01-20 DIAGNOSIS — E261 Secondary hyperaldosteronism: Secondary | ICD-10-CM | POA: Diagnosis not present

## 2021-01-20 DIAGNOSIS — I130 Hypertensive heart and chronic kidney disease with heart failure and stage 1 through stage 4 chronic kidney disease, or unspecified chronic kidney disease: Secondary | ICD-10-CM | POA: Diagnosis not present

## 2021-01-20 DIAGNOSIS — I25119 Atherosclerotic heart disease of native coronary artery with unspecified angina pectoris: Secondary | ICD-10-CM | POA: Diagnosis not present

## 2021-01-20 DIAGNOSIS — E1122 Type 2 diabetes mellitus with diabetic chronic kidney disease: Secondary | ICD-10-CM | POA: Diagnosis not present

## 2021-01-20 DIAGNOSIS — Z794 Long term (current) use of insulin: Secondary | ICD-10-CM | POA: Diagnosis not present

## 2021-01-20 DIAGNOSIS — J9611 Chronic respiratory failure with hypoxia: Secondary | ICD-10-CM | POA: Diagnosis not present

## 2021-01-20 DIAGNOSIS — E1151 Type 2 diabetes mellitus with diabetic peripheral angiopathy without gangrene: Secondary | ICD-10-CM | POA: Diagnosis not present

## 2021-01-20 DIAGNOSIS — I509 Heart failure, unspecified: Secondary | ICD-10-CM | POA: Diagnosis not present

## 2021-01-20 DIAGNOSIS — J439 Emphysema, unspecified: Secondary | ICD-10-CM | POA: Diagnosis not present

## 2021-01-22 DIAGNOSIS — G4733 Obstructive sleep apnea (adult) (pediatric): Secondary | ICD-10-CM | POA: Diagnosis not present

## 2021-01-22 DIAGNOSIS — J449 Chronic obstructive pulmonary disease, unspecified: Secondary | ICD-10-CM | POA: Diagnosis not present

## 2021-01-28 DIAGNOSIS — E1165 Type 2 diabetes mellitus with hyperglycemia: Secondary | ICD-10-CM | POA: Diagnosis not present

## 2021-01-28 DIAGNOSIS — J449 Chronic obstructive pulmonary disease, unspecified: Secondary | ICD-10-CM | POA: Diagnosis not present

## 2021-01-28 DIAGNOSIS — I10 Essential (primary) hypertension: Secondary | ICD-10-CM | POA: Diagnosis not present

## 2021-01-29 ENCOUNTER — Other Ambulatory Visit: Payer: Self-pay | Admitting: "Endocrinology

## 2021-01-29 DIAGNOSIS — R5383 Other fatigue: Secondary | ICD-10-CM

## 2021-01-30 DIAGNOSIS — R5383 Other fatigue: Secondary | ICD-10-CM | POA: Diagnosis not present

## 2021-01-31 LAB — T4, FREE: Free T4: 1.43 ng/dL (ref 0.82–1.77)

## 2021-01-31 LAB — TSH: TSH: 1.84 u[IU]/mL (ref 0.450–4.500)

## 2021-02-02 ENCOUNTER — Ambulatory Visit (INDEPENDENT_AMBULATORY_CARE_PROVIDER_SITE_OTHER): Payer: Medicare HMO | Admitting: "Endocrinology

## 2021-02-02 ENCOUNTER — Other Ambulatory Visit: Payer: Self-pay

## 2021-02-02 ENCOUNTER — Encounter: Payer: Self-pay | Admitting: "Endocrinology

## 2021-02-02 VITALS — BP 100/64 | HR 76 | Ht 70.0 in | Wt 324.6 lb

## 2021-02-02 DIAGNOSIS — E782 Mixed hyperlipidemia: Secondary | ICD-10-CM

## 2021-02-02 DIAGNOSIS — E1159 Type 2 diabetes mellitus with other circulatory complications: Secondary | ICD-10-CM | POA: Diagnosis not present

## 2021-02-02 DIAGNOSIS — I10 Essential (primary) hypertension: Secondary | ICD-10-CM | POA: Diagnosis not present

## 2021-02-02 LAB — POCT GLYCOSYLATED HEMOGLOBIN (HGB A1C): HbA1c, POC (controlled diabetic range): 7.8 % — AB (ref 0.0–7.0)

## 2021-02-02 MED ORDER — GLIPIZIDE ER 5 MG PO TB24
5.0000 mg | ORAL_TABLET | Freq: Every day | ORAL | 2 refills | Status: DC
Start: 2021-02-02 — End: 2022-02-15

## 2021-02-02 MED ORDER — TRULICITY 0.75 MG/0.5ML ~~LOC~~ SOAJ: 0.7500 mg | SUBCUTANEOUS | 2 refills | Status: DC

## 2021-02-02 NOTE — Patient Instructions (Signed)

## 2021-02-02 NOTE — Progress Notes (Signed)
 02/02/2021  Endocrinology follow-up note   Subjective:    Patient ID: [name], male    DOB: 01/11/1961.  he is being seen in follow-up  for management of currently uncontrolled symptomatic type 2 diabetes, hypertension, hyperlipidemia, obesity. PMD:  Golding, John, MD.   Past Medical History:  Diagnosis Date  . Abnormal myocardial perfusion study 01/01/2011   there a small to moderate sized inferobasal scar  . Barrett's esophagus   . Cataracts, bilateral   . Chronic kidney disease    hx of kidney stones  . Claudication (HCC) 11/16/2011   PV test perform shows normal  . COPD (chronic obstructive pulmonary disease) (HCC)   . Depression   . Diabetes (HCC)    type 2 diabetes mellitus  . Dysrhythmia   . GERD (gastroesophageal reflux disease)   . Glaucoma   . Hernia of abdominal wall   . HTN (hypertension)   . Hyperlipidemia   . Morbid obesity (HCC)   . Myocardial infarction (HCC) 2008,2009,2009  . OSA (obstructive sleep apnea)    on cpap  . PAF (paroxysmal atrial fibrillation) (HCC)   . S/P colonoscopy 2009   3-4 mm transverse colon erosions likely secondary to  ASA  . S/P endoscopy Dec 2011   moderate erosive gastritis, Barrett's esophagus 1-2cm  . Sleep apnea   . SOB (shortness of breath) 11/03/2007   2D Echo EF 50%-55%  . Stroke (HCC)    Past Surgical History:  Procedure Laterality Date  . BIOPSY N/A 04/29/2015   Procedure: BIOPSY;  Surgeon: Sandi L Fields, MD;  Location: [facility];  Service: Endoscopy;  Laterality: N/A;  . BIOPSY  08/01/2018   Procedure: BIOPSY;  Surgeon: Fields, Sandi L, MD;  Location: [facility];  Service: Endoscopy;;  esophageal  . CABG X 4  03/2008  . CARDIAC CATHETERIZATION  2009   stent placement to the left circumflex a 2.25   . CARDIAC CATHETERIZATION  07/08/2010  . CARDIAC CATHETERIZATION N/A 11/02/2019  . COLONOSCOPY WITH PROPOFOL N/A 09/27/2017   normal ileum, twenty 4 to 8 mm polyps in the sigmoid colon, descending colon,  splenic flexure, transverse colon, ascending colon, cecum.  An additional three 2 to 4 mm polyps in the rectum and the descending colon.  Diverticulosis, internal hemorrhoids.  Surgical pathology found the polyps to be one fragment of hyperplastic polyp and 22 fragments of tubular adenoma..  Recommended repeat colonoscop  . CORONARY ARTERY BYPASS GRAFT  2008   4 vessels  . CORONARY STENT PLACEMENT  12/29/12  . CORONARY STENT PLACEMENT  12/2012  . ESOPHAGEAL DILATION N/A 04/29/2015   Procedure: ESOPHAGEAL DILATION 15 mm, 16 mm;  Surgeon: Sandi L Fields, MD;  Location: [facility];  Service: Endoscopy;  Laterality: N/A;  . ESOPHAGOGASTRODUODENOSCOPY  09/2011   Barrett's esophagus, no dysplasia on biopsies. Distal esophagitis. Status post dilation. Moderate gastritis and duodenitis, but biopsies benign. Next EGD in November 2015 for surveillance of Barrett's esophagus.  . ESOPHAGOGASTRODUODENOSCOPY (EGD) WITH PROPOFOL N/A 04/29/2015   SLF: 1. Barretts esophagus 2. Moderate non-erosive gastritis.   . ESOPHAGOGASTRODUODENOSCOPY (EGD) WITH PROPOFOL N/A 08/01/2018   Barrett's, repeat in 5 years. Empiric dilatation, mild gastritis  . HERNIA REPAIR     ventral hernia repair  . LEFT HEART CATHETERIZATION WITH CORONARY/GRAFT ANGIOGRAM N/A 12/29/2012   Procedure: LEFT HEART CATHETERIZATION WITH CORONARY/GRAFT ANGIOGRAM;  Surgeon: Thomas A Kelly, MD;  Location: [facility];  Service: Cardiovascular;  Laterality: N/A;  . ORIF FIBULA FRACTURE Right 06/03/2015     Procedure: OPEN REDUCTION INTERNAL FIXATION (ORIF) DISTAL FIBULA  FRACTURE;  Surgeon: Peter W Whitfield, MD;  Location: [facility];  Service: Orthopedics;  Laterality: Right;  . PERCUTANEOUS CORONARY STENT INTERVENTION (PCI-S)  12/29/2012   Procedure: PERCUTANEOUS CORONARY STENT INTERVENTION (PCI-S);  Surgeon: Thomas A Kelly, MD;  Location: [facility];  Service: Cardiovascular;;  . POLYPECTOMY  09/27/2017   Procedure: POLYPECTOMY;  Surgeon: Fields, Sandi L, MD;   Location: [facility];  Service: Endoscopy;;  cecal polyp, ascending polyps x6, transverse colon polyps x6, splenic flexure polyps x2, descending colon polyps x6, sigmoid  colon polyp x1, rectal polyp x1   . SAVORY DILATION  09/06/2011   Procedure: SAVORY DILATION;  Surgeon: Sandi M Fields, MD;  Location: [facility];  Service: Endoscopy;  Laterality: N/A;  Dilated with 15mm  . SAVORY DILATION N/A 08/01/2018   Procedure: SAVORY DILATION;  Surgeon: Fields, Sandi L, MD;  Location: [facility];  Service: Endoscopy;  Laterality: N/A;   Social History   Socioeconomic History  . Marital status: Divorced    Spouse name: Not on file  . Number of children: Not on file  . Years of education: Not on file  . Highest education level: Not on file  Occupational History  . Not on file  Tobacco Use  . Smoking status: Current Every Day Smoker    Packs/day: 0.50    Years: 30.00    Pack years: 15.00    Types: Cigarettes  . Smokeless tobacco: Never Used  . Tobacco comment: smokes 1/2 pack per day 07/29/2020  Vaping Use  . Vaping Use: Never used  Substance and Sexual Activity  . Alcohol use: Yes    Comment: occ beer  . Drug use: No  . Sexual activity: Never  Other Topics Concern  . Not on file  Social History Narrative  . Not on file   Social Determinants of Health   Financial Resource Strain: Not on file  Food Insecurity: Not on file  Transportation Needs: Not on file  Physical Activity: Not on file  Stress: Not on file  Social Connections: Not on file   Outpatient Encounter Medications as of 02/02/2021  Medication Sig  . Dulaglutide (TRULICITY) 0.75 MG/0.5ML SOPN Inject 0.75 mg into the skin once a week.  . albuterol (VENTOLIN HFA) 108 (90 Base) MCG/ACT inhaler INHALE TWO PUFFS into THE lungs EVERY 6 HOURS AS NEEDED FOR SHORTNESS OF BREATH OR wheezing (Patient taking differently: Inhale 2 puffs into the lungs every 6 (six) hours as needed for wheezing or shortness of breath.)  . amLODipine  (NORVASC) 10 MG tablet Take 10 mg by mouth at bedtime.  . apixaban (ELIQUIS) 5 MG TABS tablet Take 1 tablet (5 mg total) by mouth 2 (two) times daily.  . blood glucose meter kit and supplies Dispense based on patient and insurance preference. Use up to two times daily as directed. (FOR ICD-E11.65)  . Continuous Blood Gluc Receiver (FREESTYLE LIBRE 2 READER) DEVI As directed  . Continuous Blood Gluc Sensor (FREESTYLE LIBRE 2 SENSOR) MISC 1 Piece by Does not apply route every 14 (fourteen) days.  . diazepam (VALIUM) 5 MG tablet Take 5 mg by mouth 2 (two) times daily.  . escitalopram (LEXAPRO) 20 MG tablet Take 20 mg daily by mouth.   . fenofibrate (TRICOR) 145 MG tablet Take 145 mg by mouth daily.  . Fluticasone-Umeclidin-Vilant 100-62.5-25 MCG/INH AEPB Inhale 1 puff daily into the lungs.   . furosemide (LASIX) 80 MG tablet Take 80   mg by mouth daily.  . glipiZIDE (GLUCOTROL XL) 5 MG 24 hr tablet Take 1 tablet (5 mg total) by mouth daily with breakfast.  . insulin regular human CONCENTRATED (HUMULIN R U-500 KWIKPEN) 500 UNIT/ML kwikpen Inject 70 Units into the skin 3 (three) times daily with meals. Only when blood glucose is above 90 and eating.  . isosorbide mononitrate (IMDUR) 120 MG 24 hr tablet Take 120 mg by mouth daily.  . Lancets (ONETOUCH DELICA PLUS LANCET33G) MISC USE TO check blood sugar FOUR TIMES DAILY AS DIRECTED  . lisinopril (ZESTRIL) 10 MG tablet Take 1 tablet (10 mg total) by mouth daily.  . meclizine (ANTIVERT) 25 MG tablet Take 25 mg by mouth 2 (two) times daily.  . metoprolol tartrate (LOPRESSOR) 100 MG tablet Take 1 tablet (100 mg total) by mouth 2 (two) times daily. OFFICE VISIT NEEDED BEFORE ADDITIONAL REFILLS  . modafinil (PROVIGIL) 200 MG tablet Take 200 mg by mouth daily.  . nitroGLYCERIN (NITROLINGUAL) 0.4 MG/SPRAY spray Place 1 spray under the tongue every 5 (five) minutes x 3 doses as needed for chest pain.  . ONETOUCH VERIO test strip USE TO check blood sugar FOUR TIMES  DAILY AS DIRECTED  . pantoprazole (PROTONIX) 40 MG tablet TAKE ONE TABLET BY MOUTH TWICE DAILY BEFORE A meal (Patient taking differently: Take 40 mg by mouth 2 (two) times daily before a meal.)  . polyethylene glycol-electrolytes (NULYTELY) 420 g solution As directed (Patient not taking: Reported on 12/11/2020)  . potassium chloride SA (K-DUR,KLOR-CON) 20 MEQ tablet Take 1 tablet (20 mEq total) by mouth 2 (two) times daily.  . rosuvastatin (CRESTOR) 20 MG tablet Take 20 mg by mouth daily.   . TRUEPLUS PEN NEEDLES 31G X 8 MM MISC USE AS DIRECTED TWICE DAILY  . Vitamin D, Ergocalciferol, (DRISDOL) 1.25 MG (50000 UNIT) CAPS capsule Take 1 capsule (50,000 Units total) by mouth every 7 (seven) days. (Patient not taking: Reported on 12/11/2020)  . [DISCONTINUED] glipiZIDE (GLUCOTROL XL) 5 MG 24 hr tablet TAKE TWO TABLETS BY MOUTH EVERY MORNING WITH BREAKFAST (Patient taking differently: Take 10 mg by mouth daily with breakfast.)  . [DISCONTINUED] JANUVIA 50 MG tablet TAKE ONE TABLET BY MOUTH EVERY MORNING (Patient taking differently: Take 50 mg by mouth daily.)   No facility-administered encounter medications on file as of 02/02/2021.    ALLERGIES: Allergies  Allergen Reactions  . Contrast Media [Iodinated Diagnostic Agents] Other (See Comments)    Pt must be premedicated before given contrast media - stops heart  . Iohexol Other (See Comments)     Consult with radiologist before pre meds are given.Desc: PT. STATES HEART STOPPED HAS TO BE PREMED.     VACCINATION STATUS: Immunization History  Administered Date(s) Administered  . Influenza,inj,Quad PF,6+ Mos 09/22/2020    Diabetes He presents for his follow-up diabetic visit. He has type 2 diabetes mellitus. Onset time: He was diagnosed at approximate age of 42 years. His disease course has been stable. There are no hypoglycemic associated symptoms. Pertinent negatives for hypoglycemia include no confusion, headaches, pallor or seizures. Pertinent  negatives for diabetes include no blurred vision, no chest pain, no fatigue, no polydipsia, no polyphagia, no polyuria and no weakness. There are no hypoglycemic complications. Symptoms are stable. Diabetic complications include a CVA and heart disease. Risk factors for coronary artery disease include diabetes mellitus, dyslipidemia, hypertension, male sex, obesity, tobacco exposure, sedentary lifestyle and family history. Current diabetic treatments: He was supposed to be on basal insulin, unfortunately he ran   out and could not afford refills and he did not call clinic for alternatives. He is compliant with treatment none of the time. His weight is increasing steadily. He is following a generally unhealthy diet. When asked about meal planning, he reported none. He has not had a previous visit with a dietitian. He never participates in exercise. His home blood glucose trend is decreasing steadily. His breakfast blood glucose range is generally 140-180 mg/dl. His lunch blood glucose range is generally 140-180 mg/dl. His dinner blood glucose range is generally 140-180 mg/dl. His bedtime blood glucose range is generally 140-180 mg/dl. His overall blood glucose range is 140-180 mg/dl. (He recently received freestyle libre device for CGM.  He presents with a device showing 70% time range, 30% slightly above range.  No hypoglycemia. His point-of-care A1c is 7.8%, overall improving from 11%.   ) An ACE inhibitor/angiotensin II receptor blocker is being taken. He does not see a podiatrist.Eye exam is not current.  Hyperlipidemia This is a chronic problem. The current episode started more than 1 year ago. The problem is uncontrolled. Recent lipid tests were reviewed and are high. Exacerbating diseases include diabetes and obesity. Pertinent negatives include no chest pain, myalgias or shortness of breath. Current antihyperlipidemic treatment includes statins. Compliance problems include medication cost and psychosocial  issues.  Risk factors for coronary artery disease include dyslipidemia, diabetes mellitus, hypertension, male sex, obesity, family history and a sedentary lifestyle.  Hypertension This is a chronic problem. The current episode started more than 1 year ago. The problem is uncontrolled. Pertinent negatives include no blurred vision, chest pain, headaches, neck pain, palpitations or shortness of breath. Risk factors for coronary artery disease include diabetes mellitus, dyslipidemia, male gender, obesity, family history, sedentary lifestyle and smoking/tobacco exposure. Past treatments include ACE inhibitors. Hypertensive end-organ damage includes CVA.     Review of systems  Constitutional: + Minimally fluctuating body weight,  current  Body mass index is 46.58 kg/m. , no fatigue, no subjective hyperthermia, no subjective hypothermia    Objective:    BP 100/64   Pulse 76   Ht 5' 10" (1.778 m)   Wt (!) 324 lb 9.6 oz (147.2 kg)   BMI 46.58 kg/m   Wt Readings from Last 3 Encounters:  02/02/21 (!) 324 lb 9.6 oz (147.2 kg)  10/01/20 (!) 311 lb (141.1 kg)  09/30/20 (!) 309 lb 9.6 oz (140.4 kg)      Physical Exam- Limited  Constitutional:  Body mass index is 46.58 kg/m. , not in acute distress, normal state of mind    CMP ( most recent) CMP     Component Value Date/Time   NA 137 12/04/2020 0000   K 4.2 12/04/2020 0000   CL 99 12/04/2020 0000   CO2 23 (A) 12/04/2020 0000   GLUCOSE 571 (HH) 05/09/2020 1452   GLUCOSE 209 (H) 01/30/2019 1320   BUN 11 12/04/2020 0000   CREATININE 1.0 12/04/2020 0000   CREATININE 0.96 05/09/2020 1452   CALCIUM 9.7 12/04/2020 0000   PROT 6.6 05/09/2020 1452   ALBUMIN 4.4 12/04/2020 0000   ALBUMIN 4.3 05/09/2020 1452   AST 15 12/04/2020 0000   ALT 23 12/04/2020 0000   ALKPHOS 84 12/04/2020 0000   BILITOT 0.3 05/09/2020 1452   GFRNONAA 80 12/04/2020 0000   GFRAA 93 12/04/2020 0000   Diabetic Labs (most recent): Lab Results  Component Value  Date   HGBA1C 7.8 (A) 02/02/2021   HGBA1C 7.4 10/01/2020   HGBA1C 11.0 (A) 06/26/2020  Lipid Panel ( most recent) Lipid Panel     Component Value Date/Time   CHOL 149 12/04/2020 0000   CHOL 132 07/03/2019 1324   TRIG 230 (A) 12/04/2020 0000   HDL 30 (A) 12/04/2020 0000   HDL 30 (L) 07/03/2019 1324   CHOLHDL 6.1 04/28/2014 0121   VLDL 61 (H) 04/28/2014 0121   LDLCALC 80 12/04/2020 0000   LDLCALC 53 07/03/2019 1324     Assessment & Plan:   1. DM type 2 causing vascular disease (HCC)  - Patient has currently uncontrolled symptomatic type 2 DM since  60 years of age. He recently received freestyle libre device for CGM.  He presents with a device showing 70% time range, 30% slightly above range.  No hypoglycemia. His point-of-care A1c is 7.8%, overall improving from 11%.   -his diabetes is complicated by coronary artery disease, CVA, obesity/sedentary life, chronic heavy smoking and Parrish S Delfavero remains at extremely high risk  for more acute and chronic complications which include CAD, CVA, CKD, retinopathy, and neuropathy. These are all discussed in detail with the patient.  - I have counseled him on diet management and weight loss, by adopting a carbohydrate restricted/protein rich diet.  - he acknowledges that there is a room for improvement in his food and drink choices. - Suggestion is made for him to avoid simple carbohydrates  from his diet including Cakes, Sweet Desserts, Ice Cream, Soda (diet and regular), Sweet Tea, Candies, Chips, Cookies, Store Bought Juices, Alcohol in Excess of  1-2 drinks a day, Artificial Sweeteners,  Coffee Creamer, and "Sugar-free" Products, Lemonade. This will help patient to have more stable blood glucose profile and potentially avoid unintended weight gain.  - I encouraged him to switch to  unprocessed or minimally processed complex starch and increased protein intake (animal or plant source), fruits, and vegetables.  - he is advised to  stick to a routine mealtimes to eat 3 meals  a day and avoid unnecessary snacks ( to snack only to correct hypoglycemia).   - I have approached him with the following individualized plan to manage diabetes and patient agrees:   - He continues to respond to the U500  insulin favorably.   -Priority is still to avoid hypoglycemia in this patient.    -He is advised to continue U500 70 units   3 times daily AC for Premeal blood glucose readings above 90 mg/dl, associated with strict monitoring of blood glucose 4 times a day-before meals and at bedtime. -He is benefiting from the CGM device.  He is advised to continue to wear it at all times.  His most recent CBG printout shows 70% time range, 30% slightly above range.  He did not have hypoglycemia.     -He has benefited from glipizide treatment.  He is advised to continue glipizide 5 mg XL p.o. daily at breakfast.   - He does not tolerate metformin due to GI side effects.  -He would benefit from Trulicity instead of Januvia.  Due to his chronic heavy smoking , he will be initiated on low-dose Trulicity 0.75 mg subcutaneously weekly.  He is advised to discontinue Januvia as soon as he obtains coverage for Trulicity. -Patient is encouraged to call clinic for blood glucose levels less than 70 or above 200 mg /dl.   - Patient specific target  A1c;  LDL, HDL, Triglycerides,  were discussed in detail.  2) BP/HTN:   -His blood pressure is controlled to target. The patient was counseled on   the dangers of tobacco use, and was advised to quit.  Reviewed strategies to maximize success, including removing cigarettes and smoking materials from environment.  He is currently on lisinopril 10 mg p.o. daily.    3) Lipids/HPL: He has had history of severe hypertriglyceridemia, recently improving to 230 from 469, mainly associated with improvement in his glycemic profile.  He is advised to continue Trilipix 135 mg p.o. nightly, and Crestor 20 mg p.o. nightly.    4)  Weight/Diet: His BMI is 46.58 -clearly complicating his diabetes care.  He is a candidate for modest weight loss.  CDE Consult has been  initiated , exercise, and detailed carbohydrates information provided.  5) vitamin D deficiency: He is on ongoing supplement with vitamin D2 50,000 units weekly for 12 weeks.  6) Chronic Care/Health Maintenance:  -he  is on ACEI/ARB and Statin medications and  is encouraged to continue to follow up with Ophthalmology, Dentist,  Podiatrist at least yearly or according to recommendations, and advised to  quit smoking. I have recommended yearly flu vaccine and pneumonia vaccination at least every 5 years; moderate intensity exercise for up to 150 minutes weekly; and  sleep for at least 7 hours a day.  The patient was counseled on the dangers of tobacco use, and was advised to quit.  Reviewed strategies to maximize success, including removing cigarettes and smoking materials from environment.    - I advised patient to maintain close follow up with Golding, John, MD for primary care needs.   - Time spent on this patient care encounter:  45 min, of which > 50% was spent in  counseling and the rest reviewing his blood glucose logs , discussing his hypoglycemia and hyperglycemia episodes, reviewing his current and  previous labs / studies  ( including abstraction from other facilities) and medications  doses and developing a  long term treatment plan and documenting his care.   Please refer to Patient Instructions for Blood Glucose Monitoring and Insulin/Medications Dosing Guide"  in media tab for additional information. Please  also refer to " Patient Self Inventory" in the Media  tab for reviewed elements of pertinent patient history.  [name] participated in the discussions, expressed understanding, and voiced agreement with the above plans.  All questions were answered to his satisfaction. he is encouraged to contact clinic should he have any  questions or concerns prior to his return visit.    Follow up plan: - Return in about 4 months (around 06/11/2021) for Bring Meter and Logs- A1c in Office.   Gebre Nida, MD Phone: [phone]  Fax: [phone]   02/02/2021, 2:45 PM   This note was partially dictated with voice recognition software. Similar sounding words can be transcribed inadequately or may not  be corrected upon review. 

## 2021-02-14 ENCOUNTER — Other Ambulatory Visit: Payer: Self-pay | Admitting: Nurse Practitioner

## 2021-02-16 DIAGNOSIS — J449 Chronic obstructive pulmonary disease, unspecified: Secondary | ICD-10-CM | POA: Diagnosis not present

## 2021-02-16 DIAGNOSIS — G4733 Obstructive sleep apnea (adult) (pediatric): Secondary | ICD-10-CM | POA: Diagnosis not present

## 2021-02-19 DIAGNOSIS — E1159 Type 2 diabetes mellitus with other circulatory complications: Secondary | ICD-10-CM | POA: Diagnosis not present

## 2021-02-22 DIAGNOSIS — G4733 Obstructive sleep apnea (adult) (pediatric): Secondary | ICD-10-CM | POA: Diagnosis not present

## 2021-02-22 DIAGNOSIS — J449 Chronic obstructive pulmonary disease, unspecified: Secondary | ICD-10-CM | POA: Diagnosis not present

## 2021-02-23 DIAGNOSIS — J449 Chronic obstructive pulmonary disease, unspecified: Secondary | ICD-10-CM | POA: Diagnosis not present

## 2021-02-23 DIAGNOSIS — R070 Pain in throat: Secondary | ICD-10-CM | POA: Diagnosis not present

## 2021-02-23 DIAGNOSIS — K219 Gastro-esophageal reflux disease without esophagitis: Secondary | ICD-10-CM | POA: Diagnosis not present

## 2021-02-23 DIAGNOSIS — G4733 Obstructive sleep apnea (adult) (pediatric): Secondary | ICD-10-CM | POA: Diagnosis not present

## 2021-02-26 ENCOUNTER — Other Ambulatory Visit: Payer: Self-pay | Admitting: "Endocrinology

## 2021-02-26 ENCOUNTER — Telehealth: Payer: Self-pay

## 2021-02-26 MED ORDER — TRULICITY 1.5 MG/0.5ML ~~LOC~~ SOAJ: 1.5000 mg | SUBCUTANEOUS | 2 refills | Status: DC

## 2021-02-26 NOTE — Telephone Encounter (Signed)
I called and spoke with Caitlin-she will take care of everything and let pt know

## 2021-02-26 NOTE — Telephone Encounter (Signed)
Yes, good idea. I will send a rx.

## 2021-02-26 NOTE — Telephone Encounter (Signed)
Caitlin at [employer]) called and wanted to know if you wanted him to continue on Trulicity and increase that to 1.5mg , he is about to have his 4th dose of what he is currently taking. Her call back number is [phone]

## 2021-02-28 DIAGNOSIS — J449 Chronic obstructive pulmonary disease, unspecified: Secondary | ICD-10-CM | POA: Diagnosis not present

## 2021-02-28 DIAGNOSIS — E1165 Type 2 diabetes mellitus with hyperglycemia: Secondary | ICD-10-CM | POA: Diagnosis not present

## 2021-02-28 DIAGNOSIS — I10 Essential (primary) hypertension: Secondary | ICD-10-CM | POA: Diagnosis not present

## 2021-03-12 DIAGNOSIS — R0609 Other forms of dyspnea: Secondary | ICD-10-CM | POA: Diagnosis not present

## 2021-03-12 DIAGNOSIS — I208 Other forms of angina pectoris: Secondary | ICD-10-CM | POA: Diagnosis not present

## 2021-03-12 DIAGNOSIS — I480 Paroxysmal atrial fibrillation: Secondary | ICD-10-CM | POA: Diagnosis not present

## 2021-03-12 DIAGNOSIS — I10 Essential (primary) hypertension: Secondary | ICD-10-CM | POA: Diagnosis not present

## 2021-03-12 DIAGNOSIS — I25708 Atherosclerosis of coronary artery bypass graft(s), unspecified, with other forms of angina pectoris: Secondary | ICD-10-CM | POA: Diagnosis not present

## 2021-03-18 DIAGNOSIS — J449 Chronic obstructive pulmonary disease, unspecified: Secondary | ICD-10-CM | POA: Diagnosis not present

## 2021-03-18 DIAGNOSIS — G4733 Obstructive sleep apnea (adult) (pediatric): Secondary | ICD-10-CM | POA: Diagnosis not present

## 2021-03-19 ENCOUNTER — Ambulatory Visit (HOSPITAL_COMMUNITY)
Admission: RE | Admit: 2021-03-19 | Discharge: 2021-03-19 | Disposition: A | Discharge status: Home / Self Care | Payer: Medicare HMO | Source: Ambulatory Visit | Attending: Pulmonary Disease | Admitting: Pulmonary Disease

## 2021-03-19 DIAGNOSIS — R0609 Other forms of dyspnea: Secondary | ICD-10-CM | POA: Diagnosis not present

## 2021-03-19 DIAGNOSIS — I2510 Atherosclerotic heart disease of native coronary artery without angina pectoris: Secondary | ICD-10-CM | POA: Diagnosis not present

## 2021-03-19 DIAGNOSIS — J8410 Pulmonary fibrosis, unspecified: Secondary | ICD-10-CM | POA: Diagnosis not present

## 2021-03-19 DIAGNOSIS — J432 Centrilobular emphysema: Secondary | ICD-10-CM | POA: Diagnosis not present

## 2021-03-19 DIAGNOSIS — R911 Solitary pulmonary nodule: Secondary | ICD-10-CM | POA: Insufficient documentation

## 2021-03-22 DIAGNOSIS — E1159 Type 2 diabetes mellitus with other circulatory complications: Secondary | ICD-10-CM | POA: Diagnosis not present

## 2021-03-24 ENCOUNTER — Ambulatory Visit: Payer: Medicare HMO | Admitting: Pulmonary Disease

## 2021-03-24 ENCOUNTER — Encounter: Payer: Self-pay | Admitting: Pulmonary Disease

## 2021-03-24 ENCOUNTER — Other Ambulatory Visit: Payer: Self-pay

## 2021-03-24 VITALS — BP 122/72 | HR 89 | Temp 98.8°F | Ht 70.0 in | Wt 330.0 lb

## 2021-03-24 DIAGNOSIS — J432 Centrilobular emphysema: Secondary | ICD-10-CM

## 2021-03-24 DIAGNOSIS — R911 Solitary pulmonary nodule: Secondary | ICD-10-CM | POA: Diagnosis not present

## 2021-03-24 DIAGNOSIS — G4733 Obstructive sleep apnea (adult) (pediatric): Secondary | ICD-10-CM | POA: Diagnosis not present

## 2021-03-24 NOTE — Assessment & Plan Note (Signed)
Weight loss encouraged, compliance with goal of at least 4-6 hrs every night is the expectation. Advised against medications with sedative side effects Cautioned against driving when sleepy - understanding that sleepiness will vary on a day to day basis   He has 3 L oxygen blended into CPAP and he is compliant

## 2021-03-24 NOTE — Patient Instructions (Signed)
Nodule is growing in size & is most likely lung cancer We will send you to radiation oncology for opinion about radiation We discussed biopsy and complications You would be high risk for surgery

## 2021-03-24 NOTE — Assessment & Plan Note (Signed)
Right upper lobe nodule has gradually increased in size from 9 mm in 2019 to current size of 15 mm.  PET scan showed low-grade hypermetabolism so this would be consistent with low-grade lung malignancy.  Other nodules are stable.  Doubt other etiology such as sarcoidosis. Options of biopsy were discussed including CT-guided needle biopsy and navigation bronchoscopy.  Both would have a high chance of pneumothorax.  He is a poor candidate for general anesthesia given cardiac issues, morbid obesity.  He is definitely not a candidate for resection given marginal lung function and overall poor health.  I discussed all these issues with the patient.  I would suggest that we proceed with a radiation oncology opinion and perhaps proceed with empiric radiation if they are in agreement.  If they feel strongly that we need a biopsy we could take opinion of interventional radiology regarding CT-guided biopsy since the nodule is very peripheral however I do worry about risk of pneumothorax  if we take that route

## 2021-03-24 NOTE — Assessment & Plan Note (Signed)
Continue Trelegy Smoking cessation was emphasized is the most important intervention for him

## 2021-03-24 NOTE — Progress Notes (Signed)
   Subjective:    Patient ID: [name], male    DOB: 10-22-1961, 60 y.o.   MRN: [id number]  HPI  60 yo obese smoker for FU ofCOPD and OSA and lung nodules. OSA -on CPAP 8 cm + 3L O2  PMH -CABG 2008 follows with [facility] cardiology,  diabetes, insulin requiring , PAF on eliquis Cor angio 5/20212/4 grafts patent, LIMA to LAD and SVG to Ramus. Occluded SVG to RCA, occluded SVG to OM. Both of these are old findings nml LVEF  Cardiolite SPECT study 01/2020 revealed large perfusion abnormality in the inferior/lateral region.  He smoked more than 40 pack years, continues about a pack per day Lung nodules were initially noted on a CT scan in 2017 , we reviewed CT scan today, dominant right upper lobe nodule has increased in size gradually  He complains of increased shortness of breath, continues to smoke.  He reports intermittent angina for which he takes nitroglycerin.  Reviewed last cardiology report from 09/2020 Compliant with Trelegy He is oxygen blended into his CPAP and reports good compliance with CPAP.  No problems with mask or pressure    Significant tests/ events reviewed  CT chest wo con 03/2021 >> subsolid 1.5 x 1.5 cm nodule RUL increasing in size & density , other nodules stable  PFTs 06/2020 moderate airway obstruction with ratio of 70, FEV1 1.90/51%, FVC 2.70/55%, no bronchodilator response, TLC 76%, DLCO 16.5/58%  PET 09/2020 >> SUV 2.1  CT chest 06/2020 RUL GG12 x 10 mmnodule  CT chest 12/2018 advanced changes of emphysema,multiple noncalcified nodules unchanged compared to 09/2018, largest right upper lobe 9 mm slight increasedcompared to 12/2016  NPSG 1/2066mild OSA 5.7/hour 07/2015 NPSG AHI 16/h  06/2016 CPAP titration-2 9 7  pounds,no O2 required CPAP 8cm  Review of Systems neg for any significant sore throat, dysphagia, itching, sneezing, nasal congestion or excess/ purulent secretions, fever, chills, sweats, unintended wt loss, pleuritic  or exertional cp, hempoptysis, orthopnea pnd or change in chronic leg swelling. Also denies presyncope, palpitations, heartburn, abdominal pain, nausea, vomiting, diarrhea or change in bowel or urinary habits, dysuria,hematuria, rash, arthralgias, visual complaints, headache, numbness weakness or ataxia.     Objective:   Physical Exam  Gen. Pleasant, obese, in no distress ENT - no lesions, no post nasal drip Neck: No JVD, no thyromegaly, no carotid bruits Lungs: Appears visibly and audibly short of breath but no use of accessory muscles, no dullness to percussion, decreased without rales or rhonchi  Cardiovascular: Rhythm regular, heart sounds  normal, no murmurs or gallops, no peripheral edema Musculoskeletal: No deformities, no cyanosis or clubbing , no tremors        Assessment & Plan:

## 2021-03-24 NOTE — Progress Notes (Signed)
Radiation Oncology         [phone] ________________________________  Initial Outpatient Consultation  Name: [name] MRN: [id number]  Date: 03/25/2021  DOB: [birth year]  QJ:[id number], Jenny Reichmann, MD  Rigoberto Noel, MD   REFERRING PHYSICIAN: Rigoberto Noel, MD  DIAGNOSIS: Right upper lobe pulmonary nodule, presumptive clinical stage I non-small cell lung cancer  HISTORY OF PRESENT ILLNESS::[name] is a 60 y.o. male who is seen as a courtesy of Dr. Elsworth Soho for an opinion concerning radiation therapy as part of management for his recently diagnosed right upper lobe nodule. The patient has a long standing history of smoking for the past 40 years with around one pack smoked per a day. Lung nodules were initially noted on a CT scan taken on 11/14/2015 showing a 0.8 x 0.6 x 0.4 cm nodule in the right upper lobe along the major fissure. The patient last met with Dr. Elsworth Soho on 03/24/21, and mentioned how he continues to have increased shortness of breath however is continuing to smoke.   The patient received a chest CT on 12/27/2018 showing advanced change of emphysema, multiple nonnoncalcified nodules unchanged compared to scan taken in 09/06/2018 (showing the then 6 new nodules), and the largest right upper lobe nodule measuring 9 mm; a slight increase compared to 12/17/2016.  Chest CT taken on 03/06/2020 showed that while the majority of the previously noted pulmonary nodules are stable in size, number and distribution, the largest nodule in the posterior aspect of the right upper lobe has slowly become more conspicuous over serial prior examinations. The largest nodule was describes as a subsolid predominantly ground-glass attenuation nodule, although rather dense. The potential for some internal air bronchogram's was mentioned in the results. The appearance was also described as concerning for potential small primary bronchogenic neoplasm such as a slow growing adenocarcinoma   Chest CT  on 06/11/2020 showed the previously described sub solid predominately ground-glass nodule of concern within the peripheral right upper lobe was similar to /minimally increased in size from the most recent comparison however as described on prior exam has slightly increased in size when compared to more remote prior exams. Slow growing adenocarcinoma was noted to be of concern. CT also showed that multiple additional small pulmonary nodules were redemonstrated bilaterally, many of which are 5 mm or less and grossly unchanged in size, number and distribution.  PET scan on 09/08/2020 showed no significant metabolic activity associated with the small ground-glass nodule in the right upper lobe. Differential remains inflammatory nodule versus low-grade adenocarcinoma. No evidence of malignancy was shown elsewhere on the scan.   Chest CT w/o contrast taken on  03/19/2021 showed an indistinct subsolid 1.5 x 1.5 cm nodule in the right upper lobe increasing in size & density was worrisome for primary bronchogenic adenocarcinoma . The additional solid and subsolid small bilateral pulmonary nodules are all stable since 09/06/2018 chest CT, And are presumably benign. CT also showed moderate to severe centrilobular and paraseptal emphysema with mild diffuse bronchial wall thickening; suggestive of COPD.  Upon visit with Dr. Elsworth Soho on 03/24/21, the options of biopsy were discussed including CT-guided needle biopsy and navigation bronchoscopy. Both would have a high chance of pneumothorax. He is a poor candidate for general anesthesia given cardiac issues, morbid obesity.  He is definitely not a candidate for resection given marginal lung function and overall poor health.  He has been disabled for many years secondary to his significant myocardial infarction subsequent smaller infarcts.   PREVIOUS RADIATION  THERAPY: No  PAST MEDICAL HISTORY:  Past Medical History:  Diagnosis Date  . Abnormal myocardial perfusion  study 01/01/2011   there a small to moderate sized inferobasal scar  . Barrett's esophagus   . Cataracts, bilateral   . Chronic kidney disease    hx of kidney stones  . Claudication ([place]) 11/16/2011   PV test perform shows normal  . COPD (chronic obstructive pulmonary disease) ([place])   . Depression   . Diabetes ([place])    type 2 diabetes mellitus  . Dysrhythmia   . GERD (gastroesophageal reflux disease)   . Glaucoma   . Hernia of abdominal wall   . HTN (hypertension)   . Hyperlipidemia   . Morbid obesity ([place])   . Myocardial infarction ([place]) 2008,2009,2009  . OSA (obstructive sleep apnea)    on cpap  . PAF (paroxysmal atrial fibrillation) ([place])   . S/P colonoscopy 2009   3-4 mm transverse colon erosions likely secondary to  ASA  . S/P endoscopy Dec 2011   moderate erosive gastritis, Barrett's esophagus 1-2cm  . Sleep apnea   . SOB (shortness of breath) 11/03/2007   2D Echo EF 50%-55%  . Stroke [facility])     PAST SURGICAL HISTORY: Past Surgical History:  Procedure Laterality Date  . BIOPSY N/A 04/29/2015   Procedure: BIOPSY;  Surgeon: Danie Binder, MD;  Location: [facility] OR[facility];  Service: Endoscopy;  Laterality: N/A;  . BIOPSY  08/01/2018   Procedure: BIOPSY;  Surgeon: Danie Binder, MD;  Location: [facility];  Service: Endoscopy;;  esophageal  . CABG X 4  03/2008  . CARDIAC CATHETERIZATION  2009   stent placement to the left circumflex a 2.25   . CARDIAC CATHETERIZATION  07/08/2010  . CARDIAC CATHETERIZATION N/A 11/02/2019  . COLONOSCOPY WITH PROPOFOL N/A 09/27/2017   normal ileum, twenty 4 to 8 mm polyps in the sigmoid colon, descending colon, splenic flexure, transverse colon, ascending colon, cecum.  An additional three 2 to 4 mm polyps in the rectum and the descending colon.  Diverticulosis, internal hemorrhoids.  Surgical pathology found the polyps to be one fragment of hyperplastic polyp and 22 fragments of tubular adenoma..  Recommended repeat colonoscop  . CORONARY ARTERY  BYPASS GRAFT  2008   4 vessels  . CORONARY STENT PLACEMENT  12/29/12  . CORONARY STENT PLACEMENT  12/2012  . ESOPHAGEAL DILATION N/A 04/29/2015   Procedure: ESOPHAGEAL DILATION 15 mm, 16 mm;  Surgeon: Danie Binder, MD;  Location: [facility];  Service: Endoscopy;  Laterality: N/A;  . ESOPHAGOGASTRODUODENOSCOPY  09/2011   Barrett's esophagus, no dysplasia on biopsies. Distal esophagitis. Status post dilation. Moderate gastritis and duodenitis, but biopsies benign. Next EGD in November 2015 for surveillance of Barrett's esophagus.  . ESOPHAGOGASTRODUODENOSCOPY (EGD) WITH PROPOFOL N/A 04/29/2015   SLF: 1. Barretts esophagus 2. Moderate non-erosive gastritis.   Marland Kitchen ESOPHAGOGASTRODUODENOSCOPY (EGD) WITH PROPOFOL N/A 08/01/2018   Barrett's, repeat in 5 years. Empiric dilatation, mild gastritis  . HERNIA REPAIR     ventral hernia repair  . LEFT HEART CATHETERIZATION WITH CORONARY/GRAFT ANGIOGRAM N/A 12/29/2012   Procedure: LEFT HEART CATHETERIZATION WITH Beatrix Fetters;  Surgeon: Troy Sine, MD;  Location: [facility] CATH LAB;  Service: Cardiovascular;  Laterality: N/A;  . ORIF FIBULA FRACTURE Right 06/03/2015   Procedure: OPEN REDUCTION INTERNAL FIXATION (ORIF) DISTAL FIBULA  FRACTURE;  Surgeon: Garald Balding, MD;  Location: [place];  Service: Orthopedics;  Laterality: Right;  . PERCUTANEOUS CORONARY STENT INTERVENTION (PCI-S)  12/29/2012  Procedure: PERCUTANEOUS CORONARY STENT INTERVENTION (PCI-S);  Surgeon: Troy Sine, MD;  Location: [facility] CATH LAB;  Service: Cardiovascular;;  . POLYPECTOMY  09/27/2017   Procedure: POLYPECTOMY;  Surgeon: Danie Binder, MD;  [facility];  Service: Endoscopy;;  cecal polyp, ascending polyps x6, transverse colon polyps x6, splenic flexure polyps x2, descending colon polyps x6, sigmoid  colon polyp x1, rectal polyp x1   . SAVORY DILATION  09/06/2011   Procedure: SAVORY DILATION;  Surgeon: Dorothyann Peng, MD;  Location: [facility];  Service: Endoscopy;   Laterality: N/A;  Dilated with 2m  . SAVORY DILATION N/A 08/01/2018   Procedure: SAVORY DILATION;  Surgeon: FDanie Binder MD;  [facility];  Service: Endoscopy;  Laterality: N/A;    FAMILY HISTORY:  Family History  Problem Relation Age of Onset  . Diabetes Mother   . Heart disease Mother   . Diabetes Father   . Heart disease Father   . Pancreatic cancer Brother        age 58 doing well  . Colon cancer Neg Hx   . Anesthesia problems Neg Hx   . Hypotension Neg Hx   . Malignant hyperthermia Neg Hx   . Pseudochol deficiency Neg Hx     SOCIAL HISTORY:  Social History   Tobacco Use  . Smoking status: Current Every Day Smoker    Packs/day: 0.50    Years: 30.00    Pack years: 15.00    Types: Cigarettes  . Smokeless tobacco: Never Used  . Tobacco comment: smokes 1/2 pack per day 03/24/21  Vaping Use  . Vaping Use: Never used  Substance Use Topics  . Alcohol use: Yes    Comment: occ beer  . Drug use: No    ALLERGIES:  Allergies  Allergen Reactions  . Contrast Media [Iodinated Diagnostic Agents] Other (See Comments)    Pt must be premedicated before given contrast media - stops heart  . Iohexol Other (See Comments)     Consult with radiologist before pre meds are given.Desc: PT. STATES HEART STOPPED HAS TO BE PREMED.     MEDICATIONS:  Current Outpatient Medications  Medication Sig Dispense Refill  . albuterol (VENTOLIN HFA) 108 (90 Base) MCG/ACT inhaler INHALE TWO PUFFS into THE lungs EVERY 6 HOURS AS NEEDED FOR SHORTNESS OF BREATH OR wheezing (Patient taking differently: Inhale 2 puffs into the lungs every 6 (six) hours as needed for wheezing or shortness of breath.) 8.5 g 3  . amLODipine (NORVASC) 10 MG tablet Take 10 mg by mouth at bedtime.    .Marland Kitchenapixaban (ELIQUIS) 5 MG TABS tablet Take 1 tablet (5 mg total) by mouth 2 (two) times daily. 1 tablet 0  . blood glucose meter kit and supplies Dispense based on patient and insurance preference. Use up to two  times daily as directed. (FOR ICD-E11.65) 1 each 5  . Continuous Blood Gluc Receiver (FREESTYLE LIBRE 2 READER) DEVI As directed 1 each 0  . Continuous Blood Gluc Sensor (FREESTYLE LIBRE 2 SENSOR) MISC 1 Piece by Does not apply route every 14 (fourteen) days. 2 each 3  . diazepam (VALIUM) 5 MG tablet Take 5 mg by mouth 2 (two) times daily.    . Dulaglutide (TRULICITY) 1.5 MVF/6.4PPSOPN Inject 1.5 mg into the skin once a week. 2 mL 2  . escitalopram (LEXAPRO) 20 MG tablet Take 20 mg daily by mouth.     . fenofibrate (TRICOR) 145 MG tablet Take 145 mg by mouth  daily.    . Fluticasone-Umeclidin-Vilant 100-62.5-25 MCG/INH AEPB Inhale 1 puff daily into the lungs.     . furosemide (LASIX) 80 MG tablet Take 80 mg by mouth daily.  4  . glipiZIDE (GLUCOTROL XL) 5 MG 24 hr tablet Take 1 tablet (5 mg total) by mouth daily with breakfast. 180 tablet 2  . insulin regular human CONCENTRATED (HUMULIN R U-500 KWIKPEN) 500 UNIT/ML kwikpen Inject 70 Units into the skin 3 (three) times daily with meals. Only when blood glucose is above 90 and eating. 42 mL 3  . isosorbide mononitrate (IMDUR) 120 MG 24 hr tablet Take 120 mg by mouth daily.    . Lancets (ONETOUCH DELICA PLUS PPIRJJ88C) MISC USE TO check blood sugar FOUR TIMES DAILY AS DIRECTED 200 each 1  . lisinopril (ZESTRIL) 10 MG tablet Take 1 tablet (10 mg total) by mouth daily. 90 tablet 1  . meclizine (ANTIVERT) 25 MG tablet Take 25 mg by mouth 2 (two) times daily.    . metoprolol tartrate (LOPRESSOR) 100 MG tablet Take 1 tablet (100 mg total) by mouth 2 (two) times daily. OFFICE VISIT NEEDED BEFORE ADDITIONAL REFILLS 28 tablet 0  . modafinil (PROVIGIL) 200 MG tablet Take 200 mg by mouth daily.    . nitroGLYCERIN (NITROLINGUAL) 0.4 MG/SPRAY spray Place 1 spray under the tongue every 5 (five) minutes x 3 doses as needed for chest pain. 12 g 0  . ONETOUCH VERIO test strip USE TO check blood sugar FOUR TIMES DAILY AS DIRECTED 200 strip 1  . pantoprazole (PROTONIX)  40 MG tablet Take 1 tablet (40 mg total) by mouth 2 (two) times daily before a meal. 180 tablet 3  . potassium chloride SA (K-DUR,KLOR-CON) 20 MEQ tablet Take 1 tablet (20 mEq total) by mouth 2 (two) times daily. 30 tablet 0  . rosuvastatin (CRESTOR) 20 MG tablet Take 20 mg by mouth daily.     . TRUEPLUS PEN NEEDLES 31G X 8 MM MISC USE AS DIRECTED TWICE DAILY 100 each 3  . polyethylene glycol-electrolytes (NULYTELY) 420 g solution As directed (Patient not taking: No sig reported) 4000 mL 0  . Vitamin D, Ergocalciferol, (DRISDOL) 1.25 MG (50000 UNIT) CAPS capsule Take 1 capsule (50,000 Units total) by mouth every 7 (seven) days. (Patient not taking: Reported on 03/25/2021) 12 capsule 0   No current facility-administered medications for this encounter.    REVIEW OF SYSTEMS:  A 10+ POINT REVIEW OF SYSTEMS WAS OBTAINED including neurology, dermatology, psychiatry, cardiac, respiratory, lymph, extremities, GI, GU, musculoskeletal, constitutional, reproductive, HEENT.  Patient reports significant dyspnea on exertion.  He uses oxygen occasionally at home as well as with his CPAP.  He denies any cough or hemoptysis.  Reports occasional episodes of angina requiring nitroglycerin.   PHYSICAL EXAM:  height is '5\' 10"'  (1.778 m) and weight is 327 lb 4 oz (148.4 kg) (abnormal). His temporal temperature is 96.9 F (36.1 C) (abnormal). His blood pressure is 107/73 and his pulse is 89. His respiration is 22 (abnormal) and oxygen saturation is 94%.   General: Alert and oriented, in no acute distress, exhibits dyspnea  going from the exam table to other areas in the room HEENT: Head is normocephalic. Extraocular movements are intact. Neck: Neck is supple, no palpable cervical or supraclavicular lymphadenopathy. Heart: Regular in rate and rhythm with no murmurs, rubs, or gallops. Chest: Clear to auscultation bilaterally, with no rhonchi, wheezes, or rales. Abdomen: Soft, nontender, nondistended, with no rigidity or  guarding. Extremities: No cyanosis or  edema. Lymphatics: see Neck Exam Skin: No concerning lesions. Musculoskeletal: symmetric strength and muscle tone throughout. Neurologic: Cranial nerves II through XII are grossly intact. No obvious focalities. Speech is fluent. Coordination is intact. Psychiatric: Judgment and insight are intact. Affect is appropriate.   ECOG = 1  0 - Asymptomatic (Fully active, able to carry on all predisease activities without restriction)  1 - Symptomatic but completely ambulatory (Restricted in physically strenuous activity but ambulatory and able to carry out work of a light or sedentary nature. For example, light housework, office work)  2 - Symptomatic, <50% in bed during the day (Ambulatory and capable of all self care but unable to carry out any work activities. Up and about more than 50% of waking hours)  3 - Symptomatic, >50% in bed, but not bedbound (Capable of only limited self-care, confined to bed or chair 50% or more of waking hours)  4 - Bedbound (Completely disabled. Cannot carry on any self-care. Totally confined to bed or chair)  5 - Death   Eustace Pen MM, Creech RH, Tormey DC, et al. [phone]). "Toxicity and response criteria of the [facility]". [place] Oncol. 5 (6): 649-55  LABORATORY DATA:  Lab Results  Component Value Date   WBC 11.8 (H) 01/30/2019   HGB 15.9 01/30/2019   HCT 45.9 01/30/2019   MCV 92.7 01/30/2019   PLT 235 01/30/2019   NEUTROABS 8.6 (H) 06/02/2015   Lab Results  Component Value Date   NA 137 12/04/2020   K 4.2 12/04/2020   CL 99 12/04/2020   CO2 23 (A) 12/04/2020   GLUCOSE 571 (HH) 05/09/2020   CREATININE 1.0 12/04/2020   CALCIUM 9.7 12/04/2020      RADIOGRAPHY: CT Chest Wo Contrast  Result Date: 03/20/2021 CLINICAL DATA:  Chronic dyspnea. Follow-up pulmonary nodules. EXAM: CT CHEST WITHOUT CONTRAST TECHNIQUE: Multidetector CT imaging of the chest was performed following the standard  protocol without IV contrast. COMPARISON:  09/08/2020 PET-CT. 06/11/2020 chest CT. FINDINGS: Cardiovascular: Normal heart size. No significant pericardial effusion/thickening. Left main and 3 vessel coronary atherosclerosis status post CABG. Atherosclerotic nonaneurysmal thoracic aorta. Top-normal caliber main pulmonary artery (3.0 cm diameter). Mediastinum/Nodes: No discrete thyroid nodules. Unremarkable esophagus. No pathologically enlarged axillary, mediastinal or hilar lymph nodes, noting limited sensitivity for the detection of hilar adenopathy on this noncontrast study. Lungs/Pleura: No pneumothorax. No pleural effusion. Moderate to severe centrilobular and paraseptal emphysema with mild diffuse bronchial wall thickening. Indistinct subsolid 1.5 x 1.5 cm posterior right upper lobe pulmonary nodule along the major fissure (series 14/image 52), slowly increasing in size and density on multiple CT studies, for example 1.2 x 1.0 cm on 06/11/2020 chest CT and 0.9 x 0.9 cm on 09/06/2018 chest CT. Innumerable additional solid and sub solid small pulmonary nodules scattered throughout both lungs for example measuring 0.6 cm in the peripheral right middle lobe (series 4/image 92) and 0.8 cm in anterior right middle lobe (series 4/image 65) are all stable since 09/06/2018 chest CT. No new significant pulmonary nodules. No acute consolidative airspace disease or lung masses. Subcentimeter calcified granulomas scattered in both lungs, unchanged. Upper abdomen: Diffuse hepatic steatosis. Musculoskeletal: No aggressive appearing focal osseous lesions. Chronic discontinuities throughout median sternotomy wires with nonunion of the median sternotomy. Moderate thoracic spondylosis. IMPRESSION: 1. Indistinct subsolid 1.5 cm posterior right upper lobe pulmonary nodule along the major fissure, increasing in size and density on multiple consecutive chest CT studies, worrisome for primary bronchogenic adenocarcinoma.  Multidisciplinary thoracic oncology consultation suggested.  2. Innumerable additional solid and subsolid small bilateral pulmonary nodules are all stable since 09/06/2018 chest CT, presumably benign. 3. No thoracic adenopathy. 4. Moderate to severe centrilobular and paraseptal emphysema with mild diffuse bronchial wall thickening, suggesting COPD. 5. Diffuse hepatic steatosis. 6. Aortic Atherosclerosis (ICD10-I70.0) and Emphysema (ICD10-J43.9). Electronically Signed   By: Ilona Sorrel M.D.   On: 03/20/2021 17:30      IMPRESSION: Right upper lobe pulmonary nodule, presumptive clinical stage I non-small cell lung cancer.  I reviewed the  patient's images in detail.  As above there has been serial enlargement over the past several months consistent with slow-growing bronchogenic carcinoma.  Patient has significant cardiopulmonary history and would not be a candidate for surgery.   bronchoscopy and CT-guided biopsy would carry significant risk for pneumothorax given his pulmonary situation. In light of this situation, I feel he would be a candidate for SBRT without tissue diagnosis..  We discussed this treatment in detail with anticipated side effects and potential long-term side effects with scaring in that part of the lung.  Patient understands that this is a early stage lung cancer now would be the best time to treat rather than wait until it enlarges significantly when outcomes would be much less successful with increased lung scarring from his radiation therapy.   The patient was encouraged to ask questions that I answered to the best of my ability.  At this time patient would like to proceed with stereotactic body radiation therapy.  A patient consent form was discussed and signed.  We retained a copy for our records.  The patient would like to proceed with radiation and will be scheduled for CT simulation.  PLAN: The patient will return for 1 day continue 7 1 PM for CT simulation with treatments to  begin approximately a week later.  Anticipate 3 SBRT treatments directed at the enlarging pulmonary nodule in the right upper lobe.   60 minutes of total time was spent for this patient encounter, including preparation, face-to-face counseling with the patient and coordination of care, physical exam, and documentation of the encounter.   ------------------------------------------------  Blair Promise, PhD, MD  This document serves as a record of services personally performed by Gery Pray, MD. It was created on his behalf by Roney Mans, a trained medical scribe. The creation of this record is based on the scribe's personal observations and the provider's statements to them. This document has been checked and approved by the attending provider.

## 2021-03-25 ENCOUNTER — Encounter: Payer: Self-pay | Admitting: Radiation Oncology

## 2021-03-25 ENCOUNTER — Ambulatory Visit
Admission: RE | Admit: 2021-03-25 | Discharge: 2021-03-25 | Disposition: A | Discharge status: Home / Self Care | Payer: Medicare HMO | Source: Ambulatory Visit | Attending: Radiation Oncology | Admitting: Radiation Oncology

## 2021-03-25 VITALS — BP 107/73 | HR 89 | Temp 96.9°F | Resp 22 | Ht 70.0 in | Wt 327.2 lb

## 2021-03-25 DIAGNOSIS — F17210 Nicotine dependence, cigarettes, uncomplicated: Secondary | ICD-10-CM | POA: Insufficient documentation

## 2021-03-25 DIAGNOSIS — Z794 Long term (current) use of insulin: Secondary | ICD-10-CM | POA: Insufficient documentation

## 2021-03-25 DIAGNOSIS — K2270 Barrett's esophagus without dysplasia: Secondary | ICD-10-CM | POA: Insufficient documentation

## 2021-03-25 DIAGNOSIS — R69 Illness, unspecified: Secondary | ICD-10-CM | POA: Diagnosis not present

## 2021-03-25 DIAGNOSIS — I10 Essential (primary) hypertension: Secondary | ICD-10-CM | POA: Diagnosis not present

## 2021-03-25 DIAGNOSIS — E119 Type 2 diabetes mellitus without complications: Secondary | ICD-10-CM | POA: Insufficient documentation

## 2021-03-25 DIAGNOSIS — K760 Fatty (change of) liver, not elsewhere classified: Secondary | ICD-10-CM | POA: Insufficient documentation

## 2021-03-25 DIAGNOSIS — R911 Solitary pulmonary nodule: Secondary | ICD-10-CM

## 2021-03-25 DIAGNOSIS — J432 Centrilobular emphysema: Secondary | ICD-10-CM | POA: Insufficient documentation

## 2021-03-25 DIAGNOSIS — Z7901 Long term (current) use of anticoagulants: Secondary | ICD-10-CM | POA: Diagnosis not present

## 2021-03-25 DIAGNOSIS — Z79899 Other long term (current) drug therapy: Secondary | ICD-10-CM | POA: Insufficient documentation

## 2021-03-25 DIAGNOSIS — G4730 Sleep apnea, unspecified: Secondary | ICD-10-CM | POA: Insufficient documentation

## 2021-03-25 DIAGNOSIS — I252 Old myocardial infarction: Secondary | ICD-10-CM | POA: Insufficient documentation

## 2021-03-25 DIAGNOSIS — E785 Hyperlipidemia, unspecified: Secondary | ICD-10-CM | POA: Diagnosis not present

## 2021-03-25 DIAGNOSIS — Z8673 Personal history of transient ischemic attack (TIA), and cerebral infarction without residual deficits: Secondary | ICD-10-CM | POA: Diagnosis not present

## 2021-03-25 NOTE — Progress Notes (Signed)
Location of tumor and Histology per Pathology Report:    Biopsy: none due to risk of pneumothorax  Past/Anticipated interventions by surgeon, if any:  none due to risk of pneumothorax  Past/Anticipated interventions by medical oncology, if any: None at this time    Pain issues, if any:  no   SAFETY ISSUES:  Prior radiation? no  Pacemaker/ICD? no  Possible current pregnancy? no  Is the patient on methotrexate? no  Current Complaints / other details:  Shortness of breath at rest.      Vitals:   03/25/21 1434  BP: 107/73  Pulse: 89  Resp: (!) 22  Temp: (!) 96.9 F (36.1 C)  TempSrc: Temporal  SpO2: 94%  Weight: (!) 327 lb 4 oz (148.4 kg)  Height: 5\' 10"  (1.778 m)

## 2021-03-25 NOTE — Progress Notes (Signed)
See MD note for nursing evaluation. °

## 2021-03-26 ENCOUNTER — Encounter: Payer: Self-pay | Admitting: *Deleted

## 2021-03-26 NOTE — Progress Notes (Signed)
[name]  Initial Assessment   [name] is a 60 y.o. year old male contacted by phone. Clinical Social [name] was referred by distress screening protocol for assessment of psychosocial needs.   SDOH (Social Determinants of Health) assessments performed: Yes no needs   Distress Screen completed: Yes ONCBCN DISTRESS SCREENING 03/25/2021  Screening Type Initial Screening  Distress experienced in past week (1-10) 7  Practical problem type Insurance  Emotional problem type Depression;Nervousness/Anxiety;Adjusting to illness  Physical Problem type Pain;Nausea/vomiting;Getting around;Breathing;Mouth sores/swallowing;Loss of appetitie;Talking;Constipation/diarrhea;Tingling hands/feet;Sexual problems;Skin dry/itchy      Family/Social Information:  . Housing Arrangement: patient lives with stepson . Family members/support persons in your life? Family, sister lives nearby . Transportation concerns: no  . Employment: Disabled. Income source: Long-Term Disability . Financial concerns: Yes, current concerns o Type of concern: Medical bills and medication  . Food access concerns: yes, preparing meals . Religious or spiritual practice: yes, baptist . Medication Concerns: yes, patient takes 20+ pills a day- states his pharmacy helps assist with cost and coordination Services Currently in place:  Food Stamps  Coping/ Adjustment to diagnosis: . Patient understands treatment plan and what happens next? yes . Concerns about diagnosis and/or treatment: How I will pay for the services I need . Patient reported stressors: Physical issues . Hopes and priorities: did not identify . Patient enjoys watching TV . Current coping skills/ strengths: Ability for insight, Communication skills and Supportive family/friends    SUMMARY: Current SDOH Barriers:  . Financial constraints related to cost of care and medications, Limited social support and Level of care concerns  Clinical  Social [name] Clinical Goal(s):  Marland Kitchen None identified  Interventions: . Discussed [name] feeling and emotions when being diagnosed with cancer, and the importance of support during treatment . Informed patient of the support team roles and support services at [facility] . Provided CSW contact information and encouraged patient to call with any questions or concerns . Provided education regarding radiation oncology treatment   Follow Up Plan: Patient will contact CSW with any support or resource needs Patient verbalizes understanding of plan: Yes    [name] , LCSW

## 2021-03-31 DIAGNOSIS — I10 Essential (primary) hypertension: Secondary | ICD-10-CM | POA: Diagnosis not present

## 2021-03-31 DIAGNOSIS — E1165 Type 2 diabetes mellitus with hyperglycemia: Secondary | ICD-10-CM | POA: Diagnosis not present

## 2021-03-31 DIAGNOSIS — J449 Chronic obstructive pulmonary disease, unspecified: Secondary | ICD-10-CM | POA: Diagnosis not present

## 2021-04-07 ENCOUNTER — Ambulatory Visit
Admission: RE | Admit: 2021-04-07 | Discharge: 2021-04-07 | Disposition: A | Discharge status: Home / Self Care | Payer: Medicare HMO | Source: Ambulatory Visit | Attending: Radiation Oncology | Admitting: Radiation Oncology

## 2021-04-07 DIAGNOSIS — R911 Solitary pulmonary nodule: Secondary | ICD-10-CM | POA: Insufficient documentation

## 2021-04-07 DIAGNOSIS — R69 Illness, unspecified: Secondary | ICD-10-CM | POA: Diagnosis not present

## 2021-04-09 DIAGNOSIS — R69 Illness, unspecified: Secondary | ICD-10-CM | POA: Diagnosis not present

## 2021-04-09 DIAGNOSIS — R911 Solitary pulmonary nodule: Secondary | ICD-10-CM | POA: Diagnosis not present

## 2021-04-15 DIAGNOSIS — E1159 Type 2 diabetes mellitus with other circulatory complications: Secondary | ICD-10-CM | POA: Diagnosis not present

## 2021-04-16 ENCOUNTER — Ambulatory Visit
Admission: RE | Admit: 2021-04-16 | Discharge: 2021-04-16 | Disposition: A | Discharge status: Home / Self Care | Payer: Medicare HMO | Source: Ambulatory Visit | Attending: Radiation Oncology | Admitting: Radiation Oncology

## 2021-04-16 ENCOUNTER — Other Ambulatory Visit: Payer: Self-pay

## 2021-04-16 DIAGNOSIS — R911 Solitary pulmonary nodule: Secondary | ICD-10-CM

## 2021-04-17 ENCOUNTER — Ambulatory Visit: Payer: Medicare HMO | Admitting: Radiation Oncology

## 2021-04-18 DIAGNOSIS — G4733 Obstructive sleep apnea (adult) (pediatric): Secondary | ICD-10-CM | POA: Diagnosis not present

## 2021-04-18 DIAGNOSIS — J449 Chronic obstructive pulmonary disease, unspecified: Secondary | ICD-10-CM | POA: Diagnosis not present

## 2021-04-20 ENCOUNTER — Ambulatory Visit: Payer: Medicare HMO | Admitting: Radiation Oncology

## 2021-04-21 ENCOUNTER — Other Ambulatory Visit: Payer: Self-pay

## 2021-04-21 ENCOUNTER — Ambulatory Visit
Admission: RE | Admit: 2021-04-21 | Discharge: 2021-04-21 | Disposition: A | Discharge status: Home / Self Care | Payer: Medicare HMO | Source: Ambulatory Visit | Attending: Radiation Oncology | Admitting: Radiation Oncology

## 2021-04-21 DIAGNOSIS — R911 Solitary pulmonary nodule: Secondary | ICD-10-CM | POA: Diagnosis not present

## 2021-04-23 ENCOUNTER — Ambulatory Visit
Admission: RE | Admit: 2021-04-23 | Discharge: 2021-04-23 | Disposition: A | Discharge status: Home / Self Care | Payer: Medicare HMO | Source: Ambulatory Visit | Attending: Radiation Oncology | Admitting: Radiation Oncology

## 2021-04-23 DIAGNOSIS — R911 Solitary pulmonary nodule: Secondary | ICD-10-CM | POA: Diagnosis not present

## 2021-04-28 ENCOUNTER — Other Ambulatory Visit: Payer: Self-pay

## 2021-04-28 ENCOUNTER — Ambulatory Visit
Admission: RE | Admit: 2021-04-28 | Discharge: 2021-04-28 | Disposition: A | Discharge status: Home / Self Care | Payer: Medicare HMO | Source: Ambulatory Visit | Attending: Radiation Oncology | Admitting: Radiation Oncology

## 2021-04-28 DIAGNOSIS — R911 Solitary pulmonary nodule: Secondary | ICD-10-CM

## 2021-04-29 ENCOUNTER — Ambulatory Visit: Payer: Medicare HMO | Admitting: Radiation Oncology

## 2021-04-30 ENCOUNTER — Other Ambulatory Visit: Payer: Self-pay

## 2021-04-30 ENCOUNTER — Ambulatory Visit
Admission: RE | Admit: 2021-04-30 | Discharge: 2021-04-30 | Disposition: A | Discharge status: Home / Self Care | Payer: Medicare HMO | Source: Ambulatory Visit | Attending: Radiation Oncology | Admitting: Radiation Oncology

## 2021-04-30 ENCOUNTER — Encounter: Payer: Self-pay | Admitting: Radiation Oncology

## 2021-04-30 DIAGNOSIS — R69 Illness, unspecified: Secondary | ICD-10-CM | POA: Diagnosis not present

## 2021-04-30 DIAGNOSIS — R911 Solitary pulmonary nodule: Secondary | ICD-10-CM

## 2021-04-30 DIAGNOSIS — I10 Essential (primary) hypertension: Secondary | ICD-10-CM | POA: Diagnosis not present

## 2021-04-30 DIAGNOSIS — E1165 Type 2 diabetes mellitus with hyperglycemia: Secondary | ICD-10-CM | POA: Diagnosis not present

## 2021-04-30 DIAGNOSIS — J449 Chronic obstructive pulmonary disease, unspecified: Secondary | ICD-10-CM | POA: Diagnosis not present

## 2021-05-11 DIAGNOSIS — I25708 Atherosclerosis of coronary artery bypass graft(s), unspecified, with other forms of angina pectoris: Secondary | ICD-10-CM | POA: Diagnosis not present

## 2021-05-11 DIAGNOSIS — I480 Paroxysmal atrial fibrillation: Secondary | ICD-10-CM | POA: Diagnosis not present

## 2021-05-11 DIAGNOSIS — I10 Essential (primary) hypertension: Secondary | ICD-10-CM | POA: Diagnosis not present

## 2021-05-12 DIAGNOSIS — J449 Chronic obstructive pulmonary disease, unspecified: Secondary | ICD-10-CM | POA: Diagnosis not present

## 2021-05-12 DIAGNOSIS — G4733 Obstructive sleep apnea (adult) (pediatric): Secondary | ICD-10-CM | POA: Diagnosis not present

## 2021-05-16 DIAGNOSIS — E1159 Type 2 diabetes mellitus with other circulatory complications: Secondary | ICD-10-CM | POA: Diagnosis not present

## 2021-05-25 ENCOUNTER — Encounter: Payer: Self-pay | Admitting: Radiation Oncology

## 2021-05-25 DIAGNOSIS — J449 Chronic obstructive pulmonary disease, unspecified: Secondary | ICD-10-CM | POA: Diagnosis not present

## 2021-05-25 DIAGNOSIS — G4733 Obstructive sleep apnea (adult) (pediatric): Secondary | ICD-10-CM | POA: Diagnosis not present

## 2021-05-26 ENCOUNTER — Encounter: Payer: Self-pay | Admitting: Radiology

## 2021-05-29 DIAGNOSIS — G4710 Hypersomnia, unspecified: Secondary | ICD-10-CM | POA: Diagnosis not present

## 2021-05-29 DIAGNOSIS — G4733 Obstructive sleep apnea (adult) (pediatric): Secondary | ICD-10-CM | POA: Diagnosis not present

## 2021-05-29 DIAGNOSIS — I10 Essential (primary) hypertension: Secondary | ICD-10-CM | POA: Diagnosis not present

## 2021-05-29 DIAGNOSIS — Z79899 Other long term (current) drug therapy: Secondary | ICD-10-CM | POA: Diagnosis not present

## 2021-05-29 DIAGNOSIS — G4714 Hypersomnia due to medical condition: Secondary | ICD-10-CM | POA: Diagnosis not present

## 2021-05-29 NOTE — Progress Notes (Incomplete)
  Radiation Oncology         ([phone]) [phone] ________________________________  Patient Name: [name] MRN: [id number] DOB: 1961/07/12 Referring Physician: Kara Mead (Profile Not Attached) Date of Service: 04/30/2021 [facility], [place]  End Of Treatment Note  Diagnoses: R91.1-Solitary pulmonary nodule  Cancer Staging: Presumptive clinical stage I non-small cell lung cancer, right upper lobe pulmonary nodule  Intent: Curative  Radiation Treatment Dates: 04/16/2021 through 04/30/2021 Site Technique Total Dose (Gy) Dose per Fx (Gy) Completed Fx Beam Energies  Lung, Right: Lung_Rt IMRT 60/60 12 5/5 6XFFF   Narrative: The patient tolerated radiation therapy relatively well. He denies pain although reports having a moderate level of fatigue, productive cough, and shortness of breath with and without exertion. Patients skin remains intact.   Plan: The patient will follow-up with radiation oncology in one month .  ________________________________________________ -----------------------------------  Blair Promise, PhD, MD  This document serves as a record of services personally performed by Gery Pray, MD. It was created on his behalf by Roney Mans, a trained medical scribe. The creation of this record is based on the scribe's personal observations and the provider's statements to them. This document has been checked and approved by the attending provider.

## 2021-05-31 DIAGNOSIS — J449 Chronic obstructive pulmonary disease, unspecified: Secondary | ICD-10-CM | POA: Diagnosis not present

## 2021-05-31 DIAGNOSIS — E1165 Type 2 diabetes mellitus with hyperglycemia: Secondary | ICD-10-CM | POA: Diagnosis not present

## 2021-05-31 DIAGNOSIS — I10 Essential (primary) hypertension: Secondary | ICD-10-CM | POA: Diagnosis not present

## 2021-05-31 NOTE — Progress Notes (Signed)
Radiation Oncology         [phone]) [phone] ________________________________  Name: [name] MRN: [id number]  Date: 06/01/2021  DOB: 05-21-1961  Follow-Up Visit Note  CC: Sharilyn Sites, MD  Rigoberto Noel, MD    ICD-10-CM   1. Lung nodule  R91.1       Diagnosis:  Right upper lobe pulmonary nodule, presumptive clinical stage I non-small cell lung cancer  Interval Since Last Radiation: 1 month and 2 days  Intent: Curative  Radiation Treatment Dates: 04/16/2021 through 04/30/2021 Site Technique Total Dose (Gy) Dose per Fx (Gy) Completed Fx Beam Energies  Lung, Right: Lung_Rt IMRT 60/60 12 5/5 6XFFF   Narrative:  The patient returns today for routine follow-up. He tolerated his recent radiation treatment well with reports of only moderate level fatigue, shortness of breath, and productive cough. The patient otherwise has had no significant interval history since the end of his treatment.      He denies any chills or fever since completion of treatment.  His overall breathing has been stable.  He does not use oxygen during the daytime but does use it at night with his CPAP.  He denies any hemoptysis.                    Allergies:  is allergic to contrast media [iodinated diagnostic agents] and iohexol.  Meds: Current Outpatient Medications  Medication Sig Dispense Refill   albuterol (VENTOLIN HFA) 108 (90 Base) MCG/ACT inhaler INHALE TWO PUFFS into THE lungs EVERY 6 HOURS AS NEEDED FOR SHORTNESS OF BREATH OR wheezing (Patient taking differently: Inhale 2 puffs into the lungs every 6 (six) hours as needed for wheezing or shortness of breath.) 8.5 g 3   amLODipine (NORVASC) 10 MG tablet Take 10 mg by mouth at bedtime.     apixaban (ELIQUIS) 5 MG TABS tablet Take 1 tablet (5 mg total) by mouth 2 (two) times daily. 1 tablet 0   blood glucose meter kit and supplies Dispense based on patient and insurance preference. Use up to two times daily as directed. (FOR ICD-E11.65) 1 each 5    Continuous Blood Gluc Receiver (FREESTYLE LIBRE 2 READER) DEVI As directed 1 each 0   Continuous Blood Gluc Sensor (FREESTYLE LIBRE 2 SENSOR) MISC 1 Piece by Does not apply route every 14 (fourteen) days. 2 each 3   diazepam (VALIUM) 5 MG tablet Take 5 mg by mouth 2 (two) times daily.     Dulaglutide (TRULICITY) 1.5 YS/0.6TK SOPN Inject 1.5 mg into the skin once a week. 2 mL 2   escitalopram (LEXAPRO) 20 MG tablet Take 20 mg daily by mouth.      fenofibrate (TRICOR) 145 MG tablet Take 145 mg by mouth daily.     Fluticasone-Umeclidin-Vilant 100-62.5-25 MCG/INH AEPB Inhale 1 puff daily into the lungs.      furosemide (LASIX) 80 MG tablet Take 80 mg by mouth daily.  4   glipiZIDE (GLUCOTROL XL) 5 MG 24 hr tablet Take 1 tablet (5 mg total) by mouth daily with breakfast. 180 tablet 2   insulin regular human CONCENTRATED (HUMULIN R U-500 KWIKPEN) 500 UNIT/ML kwikpen Inject 70 Units into the skin 3 (three) times daily with meals. Only when blood glucose is above 90 and eating. 42 mL 3   isosorbide mononitrate (IMDUR) 120 MG 24 hr tablet Take 120 mg by mouth daily.     Lancets (ONETOUCH DELICA PLUS [id number]) MISC USE TO check blood sugar FOUR TIMES DAILY  AS DIRECTED 200 each 1   lisinopril (ZESTRIL) 10 MG tablet Take 1 tablet (10 mg total) by mouth daily. 90 tablet 1   meclizine (ANTIVERT) 25 MG tablet Take 25 mg by mouth 2 (two) times daily.     metoprolol tartrate (LOPRESSOR) 100 MG tablet Take 1 tablet (100 mg total) by mouth 2 (two) times daily. OFFICE VISIT NEEDED BEFORE ADDITIONAL REFILLS 28 tablet 0   modafinil (PROVIGIL) 200 MG tablet Take 200 mg by mouth daily.     nitroGLYCERIN (NITROLINGUAL) 0.4 MG/SPRAY spray Place 1 spray under the tongue every 5 (five) minutes x 3 doses as needed for chest pain. 12 g 0   ONETOUCH VERIO test strip USE TO check blood sugar FOUR TIMES DAILY AS DIRECTED 200 strip 1   pantoprazole (PROTONIX) 40 MG tablet Take 1 tablet (40 mg total) by mouth 2 (two) times daily  before a meal. 180 tablet 3   potassium chloride SA (K-DUR,KLOR-CON) 20 MEQ tablet Take 1 tablet (20 mEq total) by mouth 2 (two) times daily. 30 tablet 0   rosuvastatin (CRESTOR) 20 MG tablet Take 20 mg by mouth daily.      TRUEPLUS PEN NEEDLES 31G X 8 MM MISC USE AS DIRECTED TWICE DAILY 100 each 3   polyethylene glycol-electrolytes (NULYTELY) 420 g solution As directed (Patient not taking: No sig reported) 4000 mL 0   Vitamin D, Ergocalciferol, (DRISDOL) 1.25 MG (50000 UNIT) CAPS capsule Take 1 capsule (50,000 Units total) by mouth every 7 (seven) days. (Patient not taking: No sig reported) 12 capsule 0   No current facility-administered medications for this encounter.    Physical Findings: The patient is in no acute distress. Patient is alert and oriented.  height is 5' 10" (1.778 m) and weight is 327 lb 6 oz (148.5 kg) (abnormal). His temporal temperature is 96.8 F (36 C) (abnormal). His blood pressure is 91/55 (abnormal) and his pulse is 96. His respiration is 22 (abnormal) and oxygen saturation is 94%. .  No significant changes. Lungs are clear to auscultation bilaterally. Heart has regular rate and rhythm. No palpable cervical, supraclavicular, or axillary adenopathy. Abdomen soft, non-tender, normal bowel sounds.    Lab Findings: Lab Results  Component Value Date   WBC 11.8 (H) 01/30/2019   HGB 15.9 01/30/2019   HCT 45.9 01/30/2019   MCV 92.7 01/30/2019   PLT 235 01/30/2019    Radiographic Findings: No results found.  Impression:  Right upper lobe pulmonary nodule, presumptive clinical stage I non-small cell lung cancer  The patient is recovering from the effects of radiation.  No evidence of recurrence on clinical exam today.  Plan: Patient will be scheduled for routine follow-up in 3 months.  Prior to this follow-up appointment he will undergo a baseline chest CT scan for follow-up on his SBRT treatments.  This scan has been ordered by Dr. Elsworth Soho.  He will see Dr. Elsworth Soho at the  same time.  Patient does complain of some dizziness with standing and is noted to have low blood pressure.  I have recommended he discuss this with his primary care physician and have possible adjustment in his blood pressure medications.    ____________________________________  Blair Promise, PhD, MD   This document serves as a record of services personally performed by Gery Pray, MD. It was created on his behalf by Roney Mans, a trained medical scribe. The creation of this record is based on the scribe's personal observations and the provider's statements to them. This  document has been checked and approved by the attending provider.

## 2021-06-01 ENCOUNTER — Encounter: Payer: Self-pay | Admitting: Radiation Oncology

## 2021-06-01 ENCOUNTER — Ambulatory Visit
Admission: RE | Admit: 2021-06-01 | Discharge: 2021-06-01 | Disposition: A | Discharge status: Home / Self Care | Payer: Medicare HMO | Source: Ambulatory Visit | Attending: Radiation Oncology | Admitting: Radiation Oncology

## 2021-06-01 ENCOUNTER — Other Ambulatory Visit: Payer: Self-pay

## 2021-06-01 VITALS — BP 91/55 | HR 96 | Temp 96.8°F | Resp 22 | Ht 70.0 in | Wt 327.4 lb

## 2021-06-01 DIAGNOSIS — R911 Solitary pulmonary nodule: Secondary | ICD-10-CM | POA: Diagnosis not present

## 2021-06-01 DIAGNOSIS — Z923 Personal history of irradiation: Secondary | ICD-10-CM | POA: Diagnosis not present

## 2021-06-01 DIAGNOSIS — Z7901 Long term (current) use of anticoagulants: Secondary | ICD-10-CM | POA: Insufficient documentation

## 2021-06-01 DIAGNOSIS — Z79899 Other long term (current) drug therapy: Secondary | ICD-10-CM | POA: Diagnosis not present

## 2021-06-01 HISTORY — DX: Personal history of irradiation: Z92.3

## 2021-06-01 NOTE — Progress Notes (Signed)
[name] is here today for follow up post radiation to the lung.  Lung Side: right  Does the patient complain of any of the following: Pain:none Shortness of breath w/wo exertion: shortness of breath at rest Cough: productive cough with white phlegm Hemoptysis: denies Pain with swallowing: occasionally Swallowing/choking concerns: chokes easily with solids and liquids Appetite: poor Energy Level: poor Post radiation skin Changes: denies    Additional comments if applicable: none  Vitals:   06/01/21 1502  BP: (!) 91/55  Pulse: 96  Resp: (!) 22  Temp: (!) 96.8 F (36 C)  TempSrc: Temporal  SpO2: 94%  Weight: (!) 327 lb 6 oz (148.5 kg)  Height: 5\' 10"  (1.778 m)

## 2021-06-03 ENCOUNTER — Other Ambulatory Visit: Payer: Self-pay | Admitting: Pulmonary Disease

## 2021-06-09 DIAGNOSIS — Z719 Counseling, unspecified: Secondary | ICD-10-CM | POA: Diagnosis not present

## 2021-06-09 DIAGNOSIS — I480 Paroxysmal atrial fibrillation: Secondary | ICD-10-CM | POA: Diagnosis not present

## 2021-06-09 DIAGNOSIS — J441 Chronic obstructive pulmonary disease with (acute) exacerbation: Secondary | ICD-10-CM | POA: Diagnosis not present

## 2021-06-09 DIAGNOSIS — F17210 Nicotine dependence, cigarettes, uncomplicated: Secondary | ICD-10-CM | POA: Diagnosis not present

## 2021-06-09 DIAGNOSIS — Z125 Encounter for screening for malignant neoplasm of prostate: Secondary | ICD-10-CM | POA: Diagnosis not present

## 2021-06-09 DIAGNOSIS — E538 Deficiency of other specified B group vitamins: Secondary | ICD-10-CM | POA: Diagnosis not present

## 2021-06-09 DIAGNOSIS — R69 Illness, unspecified: Secondary | ICD-10-CM | POA: Diagnosis not present

## 2021-06-09 DIAGNOSIS — E782 Mixed hyperlipidemia: Secondary | ICD-10-CM | POA: Diagnosis not present

## 2021-06-09 DIAGNOSIS — E1140 Type 2 diabetes mellitus with diabetic neuropathy, unspecified: Secondary | ICD-10-CM | POA: Diagnosis not present

## 2021-06-09 DIAGNOSIS — I639 Cerebral infarction, unspecified: Secondary | ICD-10-CM | POA: Diagnosis not present

## 2021-06-09 DIAGNOSIS — Z6841 Body Mass Index (BMI) 40.0 and over, adult: Secondary | ICD-10-CM | POA: Diagnosis not present

## 2021-06-09 DIAGNOSIS — G4733 Obstructive sleep apnea (adult) (pediatric): Secondary | ICD-10-CM | POA: Diagnosis not present

## 2021-06-09 DIAGNOSIS — E559 Vitamin D deficiency, unspecified: Secondary | ICD-10-CM | POA: Diagnosis not present

## 2021-06-09 DIAGNOSIS — E7849 Other hyperlipidemia: Secondary | ICD-10-CM | POA: Diagnosis not present

## 2021-06-09 LAB — HEMOGLOBIN A1C: Hemoglobin A1C: 6.3

## 2021-06-11 ENCOUNTER — Ambulatory Visit (INDEPENDENT_AMBULATORY_CARE_PROVIDER_SITE_OTHER): Payer: Medicare HMO | Admitting: "Endocrinology

## 2021-06-11 ENCOUNTER — Other Ambulatory Visit: Payer: Self-pay

## 2021-06-11 ENCOUNTER — Encounter: Payer: Self-pay | Admitting: "Endocrinology

## 2021-06-11 VITALS — BP 100/66 | HR 80 | Ht 70.0 in | Wt 331.4 lb

## 2021-06-11 DIAGNOSIS — E1159 Type 2 diabetes mellitus with other circulatory complications: Secondary | ICD-10-CM

## 2021-06-11 MED ORDER — ONETOUCH VERIO W/DEVICE KIT: 1.0000 | PACK | 0 refills | Status: AC | PRN

## 2021-06-11 NOTE — Patient Instructions (Signed)

## 2021-06-11 NOTE — Progress Notes (Signed)
06/11/2021  Endocrinology follow-up note   Subjective:    Patient ID: [name], male    DOB: [birth year].  he is being seen in follow-up  for management of currently uncontrolled symptomatic type 2 diabetes, hypertension, hyperlipidemia, obesity. PMD:  Sharilyn Sites, MD.   Past Medical History:  Diagnosis Date   Abnormal myocardial perfusion study 01/01/2011   there a small to moderate sized inferobasal scar   Barrett's esophagus    Cataracts, bilateral    Chronic kidney disease    hx of kidney stones   Claudication ([place]) 11/16/2011   PV test perform shows normal   COPD (chronic obstructive pulmonary disease) ([place])    Depression    Diabetes ([place])    type 2 diabetes mellitus   Dysrhythmia    GERD (gastroesophageal reflux disease)    Glaucoma    Hernia of abdominal wall    History of radiation therapy    Right lung, SBRT- 04/16/21-04/30/21- Dr. Gery Pray   HTN (hypertension)    Hyperlipidemia    Morbid obesity ([place])    Myocardial infarction ([place]) 2008,2009,2009   OSA (obstructive sleep apnea)    on cpap   PAF (paroxysmal atrial fibrillation) ([place])    S/P colonoscopy 2009   3-4 mm transverse colon erosions likely secondary to  ASA   S/P endoscopy 10/2010   moderate erosive gastritis, Barrett's esophagus 1-2cm   Sleep apnea    SOB (shortness of breath) 11/03/2007   2D Echo EF 50%-55%   Stroke [facility])    Past Surgical History:  Procedure Laterality Date   BIOPSY N/A 04/29/2015   Procedure: BIOPSY;  Surgeon: Danie Binder, MD;  Location: [facility] ORS;  Service: Endoscopy;  Laterality: N/A;   BIOPSY  08/01/2018   Procedure: BIOPSY;  Surgeon: Danie Binder, MD;  Location: [facility] END[facility] SU[facility];  Service: Endoscopy;;  esophageal   CABG X 4  03/2008   CARDIAC CATHETERIZATION  2009   stent placement to the left circumflex a 2.25    CARDIAC CATHETERIZATION  07/08/2010   CARDIAC CATHETERIZATION N/A 11/02/2019   COLONOSCOPY WITH PROPOFOL N/A 09/27/2017   normal ileum,  twenty 4 to 8 mm polyps in the sigmoid colon, descending colon, splenic flexure, transverse colon, ascending colon, cecum.  An additional three 2 to 4 mm polyps in the rectum and the descending colon.  Diverticulosis, internal hemorrhoids.  Surgical pathology found the polyps to be one fragment of hyperplastic polyp and 22 fragments of tubular adenoma..  Recommended repeat colonoscop   CORONARY ARTERY BYPASS GRAFT  2008   4 vessels   CORONARY STENT PLACEMENT  12/29/12   CORONARY STENT PLACEMENT  12/2012   ESOPHAGEAL DILATION N/A 04/29/2015   Procedure: ESOPHAGEAL DILATION 15 mm, 16 mm;  Surgeon: Danie Binder, MD;  Location: [facility];  Service: Endoscopy;  Laterality: N/A;   ESOPHAGOGASTRODUODENOSCOPY  09/2011   Barrett's esophagus, no dysplasia on biopsies. Distal esophagitis. Status post dilation. Moderate gastritis and duodenitis, but biopsies benign. Next EGD in November 2015 for surveillance of Barrett's esophagus.   ESOPHAGOGASTRODUODENOSCOPY (EGD) WITH PROPOFOL N/A 04/29/2015   SLF: 1. Barretts esophagus 2. Moderate non-erosive gastritis.    ESOPHAGOGASTRODUODENOSCOPY (EGD) WITH PROPOFOL N/A 08/01/2018   Barrett's, repeat in 5 years. Empiric dilatation, mild gastritis   HERNIA REPAIR     ventral hernia repair   LEFT HEART CATHETERIZATION WITH CORONARY/GRAFT ANGIOGRAM N/A 12/29/2012   Procedure: LEFT HEART CATHETERIZATION WITH Beatrix Fetters;  Surgeon: Troy Sine, MD;  Location: [facility]  CATH LAB;  Service: Cardiovascular;  Laterality: N/A;   ORIF FIBULA FRACTURE Right 06/03/2015   Procedure: OPEN REDUCTION INTERNAL FIXATION (ORIF) DISTAL FIBULA  FRACTURE;  Surgeon: Garald Balding, MD;  Location: [place];  Service: Orthopedics;  Laterality: Right;   PERCUTANEOUS CORONARY STENT INTERVENTION (PCI-S)  12/29/2012   Procedure: PERCUTANEOUS CORONARY STENT INTERVENTION (PCI-S);  Surgeon: Troy Sine, MD;  Location: [facility] CATH LAB;  Service: Cardiovascular;;   POLYPECTOMY  09/27/2017   Procedure:  POLYPECTOMY;  Surgeon: Danie Binder, MD;  [facility];  Service: Endoscopy;;  cecal polyp, ascending polyps x6, transverse colon polyps x6, splenic flexure polyps x2, descending colon polyps x6, sigmoid  colon polyp x1, rectal polyp x1    SAVORY DILATION  09/06/2011   Procedure: SAVORY DILATION;  Surgeon: Dorothyann Peng, MD;  Location: [facility];  Service: Endoscopy;  Laterality: N/A;  Dilated with 68m   SAVORY DILATION N/A 08/01/2018   Procedure: SAVORY DILATION;  Surgeon: FDanie Binder MD;  [facility];  Service: Endoscopy;  Laterality: N/A;   Social History   Socioeconomic History   Marital status: Divorced    Spouse name: Not on file   Number of children: Not on file   Years of education: Not on file   Highest education level: Not on file  Occupational History   Not on file  Tobacco Use   Smoking status: Every Day    Packs/day: 0.50    Years: 30.00    Pack years: 15.00    Types: Cigarettes   Smokeless tobacco: Never   Tobacco comments:    smokes 1/2 pack per day 03/24/21  Vaping Use   Vaping Use: Never used  Substance and Sexual Activity   Alcohol use: Yes    Comment: occ beer   Drug use: No   Sexual activity: Never  Other Topics Concern   Not on file  Social History Narrative   Not on file   Social Determinants of Health   Financial Resource Strain: Not on file  Food Insecurity: Not on file  Transportation Needs: Not on file  Physical Activity: Not on file  Stress: Not on file  Social Connections: Not on file   Outpatient Encounter Medications as of 06/11/2021  Medication Sig   Blood Glucose Monitoring Suppl (ONETOUCH VERIO) w/Device KIT 1 each by Does not apply route as needed.   Cyanocobalamin (VITAMIN B-12 IJ) Inject 1,000 mcg as directed every 30 (thirty) days.   Vitamin D, Ergocalciferol, (DRISDOL) 1.25 MG (50000 UNIT) CAPS capsule Take 1 capsule (50,000 Units total) by mouth every 7 (seven) days.   albuterol (VENTOLIN HFA) 108 (90  Base) MCG/ACT inhaler INHALE TWO PUFFS into THE lungs EVERY 6 HOURS AS NEEDED FOR SHORTNESS OF BREATH OR wheezing   amLODipine (NORVASC) 10 MG tablet Take 10 mg by mouth at bedtime.   apixaban (ELIQUIS) 5 MG TABS tablet Take 1 tablet (5 mg total) by mouth 2 (two) times daily.   blood glucose meter kit and supplies Dispense based on patient and insurance preference. Use up to two times daily as directed. (FOR ICD-E11.65)   Continuous Blood Gluc Receiver (FREESTYLE LIBRE 2 READER) DEVI As directed   Continuous Blood Gluc Sensor (FREESTYLE LIBRE 2 SENSOR) MISC 1 Piece by Does not apply route every 14 (fourteen) days.   diazepam (VALIUM) 5 MG tablet Take 5 mg by mouth 2 (two) times daily.   Dulaglutide (TRULICITY) 1.5 MYO/0.6YOSOPN Inject 1.5 mg into the  skin once a week.   escitalopram (LEXAPRO) 20 MG tablet Take 20 mg daily by mouth.    fenofibrate (TRICOR) 145 MG tablet Take 145 mg by mouth daily.   Fluticasone-Umeclidin-Vilant 100-62.5-25 MCG/INH AEPB Inhale 1 puff daily into the lungs.    furosemide (LASIX) 80 MG tablet Take 80 mg by mouth daily.   glipiZIDE (GLUCOTROL XL) 5 MG 24 hr tablet Take 1 tablet (5 mg total) by mouth daily with breakfast.   insulin regular human CONCENTRATED (HUMULIN R U-500 KWIKPEN) 500 UNIT/ML kwikpen Inject 70 Units into the skin 3 (three) times daily with meals. Only when blood glucose is above 90 and eating.   isosorbide mononitrate (IMDUR) 120 MG 24 hr tablet Take 120 mg by mouth daily.   Lancets (ONETOUCH DELICA PLUS [id number]) MISC USE TO check blood sugar FOUR TIMES DAILY AS DIRECTED   lisinopril (ZESTRIL) 10 MG tablet Take 1 tablet (10 mg total) by mouth daily.   meclizine (ANTIVERT) 25 MG tablet Take 25 mg by mouth 2 (two) times daily.   metoprolol tartrate (LOPRESSOR) 100 MG tablet Take 1 tablet (100 mg total) by mouth 2 (two) times daily. OFFICE VISIT NEEDED BEFORE ADDITIONAL REFILLS   modafinil (PROVIGIL) 200 MG tablet Take 200 mg by mouth daily.    nitroGLYCERIN (NITROLINGUAL) 0.4 MG/SPRAY spray Place 1 spray under the tongue every 5 (five) minutes x 3 doses as needed for chest pain.   ONETOUCH VERIO test strip USE TO check blood sugar FOUR TIMES DAILY AS DIRECTED   pantoprazole (PROTONIX) 40 MG tablet Take 1 tablet (40 mg total) by mouth 2 (two) times daily before a meal.   polyethylene glycol-electrolytes (NULYTELY) 420 g solution As directed (Patient not taking: No sig reported)   potassium chloride SA (K-DUR,KLOR-CON) 20 MEQ tablet Take 1 tablet (20 mEq total) by mouth 2 (two) times daily.   rosuvastatin (CRESTOR) 20 MG tablet Take 20 mg by mouth daily.    TRUEPLUS PEN NEEDLES 31G X 8 MM MISC USE AS DIRECTED TWICE DAILY   No facility-administered encounter medications on file as of 06/11/2021.    ALLERGIES: Allergies  Allergen Reactions   Contrast Media [Iodinated Diagnostic Agents] Other (See Comments)    Pt must be premedicated before given contrast media - stops heart   Iohexol Other (See Comments)     Consult with radiologist before pre meds are given.Desc: PT. STATES HEART STOPPED HAS TO BE PREMED.     VACCINATION STATUS: Immunization History  Administered Date(s) Administered   Influenza,inj,Quad PF,6+ Mos 09/22/2020    Diabetes He presents for his follow-up diabetic visit. He has type 2 diabetes mellitus. Onset time: He was diagnosed at approximate age of 13 years. His disease course has been improving. There are no hypoglycemic associated symptoms. Pertinent negatives for hypoglycemia include no confusion, headaches, pallor or seizures. Pertinent negatives for diabetes include no blurred vision, no chest pain, no fatigue, no polydipsia, no polyphagia, no polyuria and no weakness. There are no hypoglycemic complications. Symptoms are improving. Diabetic complications include a CVA and heart disease. Risk factors for coronary artery disease include diabetes mellitus, dyslipidemia, hypertension, male sex, obesity, tobacco  exposure, sedentary lifestyle and family history. Current diabetic treatments: He was supposed to be on basal insulin, unfortunately he ran out and could not afford refills and he did not call clinic for alternatives. He is compliant with treatment none of the time. His weight is fluctuating minimally. He is following a generally unhealthy diet. When asked about meal planning,  he reported none. He has not had a previous visit with a dietitian. He never participates in exercise. His home blood glucose trend is decreasing steadily. His breakfast blood glucose range is generally 140-180 mg/dl. His lunch blood glucose range is generally 140-180 mg/dl. His dinner blood glucose range is generally 140-180 mg/dl. His bedtime blood glucose range is generally 140-180 mg/dl. His overall blood glucose range is 140-180 mg/dl. (He presents with controlled glycemic profile using his CGM.  Analysis shows 67% time range, 31% above range.  No major hypoglycemia.  His previsit labs show A1c of 6.3%, overall improving from 11.1% .   ) An ACE inhibitor/angiotensin II receptor blocker is being taken. He does not see a podiatrist.Eye exam is not current.  Hyperlipidemia This is a chronic problem. The current episode started more than 1 year ago. The problem is uncontrolled. Recent lipid tests were reviewed and are high. Exacerbating diseases include diabetes and obesity. Pertinent negatives include no chest pain, myalgias or shortness of breath. Current antihyperlipidemic treatment includes statins. Compliance problems include medication cost and psychosocial issues.  Risk factors for coronary artery disease include dyslipidemia, diabetes mellitus, hypertension, male sex, obesity, family history and a sedentary lifestyle.  Hypertension This is a chronic problem. The current episode started more than 1 year ago. The problem is uncontrolled. Pertinent negatives include no blurred vision, chest pain, headaches, neck pain, palpitations  or shortness of breath. Risk factors for coronary artery disease include diabetes mellitus, dyslipidemia, male gender, obesity, family history, sedentary lifestyle and smoking/tobacco exposure. Past treatments include ACE inhibitors. Hypertensive end-organ damage includes CVA.    Review of systems  Constitutional: + Minimally fluctuating body weight,  current  Body mass index is 47.55 kg/m. , no fatigue, no subjective hyperthermia, no subjective hypothermia    Objective:    BP 100/66   Pulse 80   Ht _0  (1.778 m)   Wt (!) 331 lb 6.4 oz (150.3 kg)   BMI 47.55 kg/m   Wt Readings from Last 3 Encounters:  06/11/21 (!) 331 lb 6.4 oz (150.3 kg)  06/01/21 (!) 327 lb 6 oz (148.5 kg)  03/25/21 (!) 327 lb 4 oz (148.4 kg)      Physical Exam- Limited  Constitutional:  Body mass index is 47.55 kg/m. , not in acute distress, normal state of mind    CMP ( most recent) CMP     Component Value Date/Time   NA 137 12/04/2020 0000   K 4.2 12/04/2020 0000   CL 99 12/04/2020 0000   CO2 23 (A) 12/04/2020 0000   GLUCOSE 571 (HH) 05/09/2020 1452   GLUCOSE 209 (H) 01/30/2019 1320   BUN 11 12/04/2020 0000   CREATININE 1.0 12/04/2020 0000   CREATININE 0.96 05/09/2020 1452   CALCIUM 9.7 12/04/2020 0000   PROT 6.6 05/09/2020 1452   ALBUMIN 4.4 12/04/2020 0000   ALBUMIN 4.3 05/09/2020 1452   AST 15 12/04/2020 0000   ALT 23 12/04/2020 0000   ALKPHOS 84 12/04/2020 0000   BILITOT 0.3 05/09/2020 1452   GFRNONAA 80 12/04/2020 0000   GFRAA 93 12/04/2020 0000   Diabetic Labs (most recent): Lab Results  Component Value Date   HGBA1C 6.3 06/09/2021   HGBA1C 7.8 (A) 02/02/2021   HGBA1C 7.4 10/01/2020     Lipid Panel ( most recent) Lipid Panel     Component Value Date/Time   CHOL 149 12/04/2020 0000   CHOL 132 07/03/2019 1324   TRIG 230 (A) 12/04/2020 0000   HDL  30 (A) 12/04/2020 0000   HDL 30 (L) 07/03/2019 1324   CHOLHDL 6.1 04/28/2014 0121   VLDL 61 (H) 04/28/2014 0121    LDLCALC 80 12/04/2020 0000   LDLCALC 53 07/03/2019 1324     Assessment & Plan:   1. DM type 2 causing vascular disease ([place])  - Patient has currently uncontrolled symptomatic type 2 DM since  60 years of age. He presents with controlled glycemic profile using his CGM.  Analysis shows 67% time range, 31% above range.  No major hypoglycemia.  His previsit labs show A1c of 6.3%, overall improving from 11.1% . -his diabetes is complicated by coronary artery disease, CVA, obesity/sedentary life, chronic heavy smoking and JONNIE TRUXILLO remains at extremely high risk  for more acute and chronic complications which include CAD, CVA, CKD, retinopathy, and neuropathy. These are all discussed in detail with the patient.  - I have counseled him on diet management and weight loss, by adopting a carbohydrate restricted/protein rich diet.  - he acknowledges that there is a room for improvement in his food and drink choices. - Suggestion is made for him to avoid simple carbohydrates  from his diet including Cakes, Sweet Desserts, Ice Cream, Soda (diet and regular), Sweet Tea, Candies, Chips, Cookies, Store Bought Juices, Alcohol in Excess of  1-2 drinks a day, Artificial Sweeteners,  Coffee Creamer, and "Sugar-free" Products, Lemonade. This will help patient to have more stable blood glucose profile and potentially avoid unintended weight gain. lood glucose profile and potentially avoid unintended weight gain.  - I encouraged him to switch to  unprocessed or minimally processed complex starch and increased protein intake (animal or plant source), fruits, and vegetables.  - he is advised to stick to a routine mealtimes to eat 3 meals  a day and avoid unnecessary snacks ( to snack only to correct hypoglycemia).   - I have approached him with the following individualized plan to manage diabetes and patient agrees:   - He continues to respond to the U500  insulin favorably.   -Priority is still to avoid  hypoglycemia in this patient.    -He is advised to continue U500 70 units 3 times a day for [place] blood glucose readings above 90 mg per DL,  associated with strict monitoring of blood glucose 4 times a day-before meals and at bedtime. -He is benefiting from the CGM device.  He is advised to continue to wear it at all times.   He is advised to utilize his CGM to monitor blood glucose at least 4 times a day-before meals and at bedtime.  -He has benefited from glipizide treatment.  He is advised to continue glipizide 5 mg XL p.o. daily at breakfast.   - He does not tolerate metformin due to GI side effects.  -He has benefited from low-dose Trulicity.  Due to his chronic heavy smoking , he will be initiated on low-dose Trulicity 0.24 mg subcutaneously weekly.  He is advised to discontinue Januvia as soon as he obtains coverage for Trulicity. -Patient is encouraged to call clinic for blood glucose levels less than 70 or above 200 mg /dl.   - Patient specific target  A1c;  LDL, HDL, Triglycerides,  were discussed in detail.  2) BP/HTN:  -His blood pressure is controlled to target. The patient was counseled on the dangers of tobacco use, and was advised to quit.  Reviewed strategies to maximize success, including removing cigarettes and smoking materials from environment.  He is currently on  lisinopril 10 mg p.o. daily.    3) Lipids/HPL: He has had history of severe hypertriglyceridemia, recently improving to 230 from 469, mainly associated with improvement in his glycemic profile.  He is advised to continue Trilipix 135 mg p.o. nightly, and Crestor 20 mg p.o. nightly, side effects and precautions discussed with him.      4)  Weight/Diet: His BMI is 29.2  -clearly complicating his diabetes care.  He is a candidate for modest weight loss.  CDE Consult has been  initiated , exercise, and detailed carbohydrates information provided.  5) vitamin D deficiency: He is on ongoing supplement with vitamin  D2 50,000 units weekly for 12 weeks.  6) Chronic Care/Health Maintenance:  -he  is on ACEI/ARB and Statin medications and  is encouraged to continue to follow up with Ophthalmology, Dentist,  Podiatrist at least yearly or according to recommendations, and advised to  quit smoking. I have recommended yearly flu vaccine and pneumonia vaccination at least every 5 years; moderate intensity exercise for up to 150 minutes weekly; and  sleep for at least 7 hours a day.  The patient was counseled on the dangers of tobacco use, and was advised to quit.  Reviewed strategies to maximize success, including removing cigarettes and smoking materials from environment.    - I advised patient to maintain close follow up with Sharilyn Sites, MD for primary care needs.     I spent 41 minutes in the care of the patient today including review of labs from [place], Lipids, Thyroid Function, Hematology (current and previous including abstractions from other facilities); face-to-face time discussing  his blood glucose readings/logs, discussing hypoglycemia and hyperglycemia episodes and symptoms, medications doses, his options of short and long term treatment based on the latest standards of care / guidelines;  discussion about incorporating lifestyle medicine;  and documenting the encounter.    Please refer to Patient Instructions for Blood Glucose Monitoring and Insulin/Medications Dosing Guide"  in media tab for additional information. Please  also refer to " Patient Self Inventory" in the Media  tab for reviewed elements of pertinent patient history.  [name] participated in the discussions, expressed understanding, and voiced agreement with the above plans.  All questions were answered to his satisfaction. he is encouraged to contact clinic should he have any questions or concerns prior to his return visit.    Follow up plan: - Return in about 4 months (around 10/11/2021) for F/U with Pre-visit Labs,  Meter, Logs, A1c here.Glade Lloyd, MD Phone: [phone]  Fax: [phone]   06/11/2021, 2:35 PM   This note was partially dictated with voice recognition software. Similar sounding words can be transcribed inadequately or may not  be corrected upon review.

## 2021-06-12 DIAGNOSIS — J449 Chronic obstructive pulmonary disease, unspecified: Secondary | ICD-10-CM | POA: Diagnosis not present

## 2021-06-12 DIAGNOSIS — G4733 Obstructive sleep apnea (adult) (pediatric): Secondary | ICD-10-CM | POA: Diagnosis not present

## 2021-06-16 DIAGNOSIS — E1159 Type 2 diabetes mellitus with other circulatory complications: Secondary | ICD-10-CM | POA: Diagnosis not present

## 2021-06-22 ENCOUNTER — Telehealth: Payer: Self-pay | Admitting: Pulmonary Disease

## 2021-06-22 DIAGNOSIS — R911 Solitary pulmonary nodule: Secondary | ICD-10-CM

## 2021-06-22 NOTE — Telephone Encounter (Signed)
ATC patient, LMTCB 

## 2021-06-22 NOTE — Telephone Encounter (Signed)
Called and spoke with patient who states that he needs to schedule CT scan. Needs done at [facility]. I don't see anywhere that mentions that he is supposed to have another scan done. Patient had one in May.   Dr. Elsworth Soho please advise

## 2021-06-22 NOTE — Telephone Encounter (Signed)
Pt returning call.  [phone].

## 2021-06-23 NOTE — Telephone Encounter (Signed)
Called and spoke with patient to let him know that Dr. Elsworth Soho would like to get his CT done in October and I was going to place order. He expressed understanding. Order has been placed. Nothing further needed at this time.

## 2021-07-01 DIAGNOSIS — I10 Essential (primary) hypertension: Secondary | ICD-10-CM | POA: Diagnosis not present

## 2021-07-01 DIAGNOSIS — J449 Chronic obstructive pulmonary disease, unspecified: Secondary | ICD-10-CM | POA: Diagnosis not present

## 2021-07-01 DIAGNOSIS — E1165 Type 2 diabetes mellitus with hyperglycemia: Secondary | ICD-10-CM | POA: Diagnosis not present

## 2021-07-13 DIAGNOSIS — G4733 Obstructive sleep apnea (adult) (pediatric): Secondary | ICD-10-CM | POA: Diagnosis not present

## 2021-07-13 DIAGNOSIS — J449 Chronic obstructive pulmonary disease, unspecified: Secondary | ICD-10-CM | POA: Diagnosis not present

## 2021-07-14 DIAGNOSIS — E1136 Type 2 diabetes mellitus with diabetic cataract: Secondary | ICD-10-CM | POA: Diagnosis not present

## 2021-07-14 DIAGNOSIS — Z794 Long term (current) use of insulin: Secondary | ICD-10-CM | POA: Diagnosis not present

## 2021-07-14 DIAGNOSIS — H52203 Unspecified astigmatism, bilateral: Secondary | ICD-10-CM | POA: Diagnosis not present

## 2021-07-14 DIAGNOSIS — Z7984 Long term (current) use of oral hypoglycemic drugs: Secondary | ICD-10-CM | POA: Diagnosis not present

## 2021-07-14 DIAGNOSIS — H524 Presbyopia: Secondary | ICD-10-CM | POA: Diagnosis not present

## 2021-07-14 DIAGNOSIS — H25813 Combined forms of age-related cataract, bilateral: Secondary | ICD-10-CM | POA: Diagnosis not present

## 2021-07-14 LAB — HM DIABETES EYE EXAM

## 2021-07-15 DIAGNOSIS — Z0100 Encounter for examination of eyes and vision without abnormal findings: Secondary | ICD-10-CM | POA: Diagnosis not present

## 2021-07-17 DIAGNOSIS — E1159 Type 2 diabetes mellitus with other circulatory complications: Secondary | ICD-10-CM | POA: Diagnosis not present

## 2021-07-22 DIAGNOSIS — E538 Deficiency of other specified B group vitamins: Secondary | ICD-10-CM | POA: Diagnosis not present

## 2021-07-25 ENCOUNTER — Other Ambulatory Visit: Payer: Self-pay | Admitting: "Endocrinology

## 2021-07-31 DIAGNOSIS — I10 Essential (primary) hypertension: Secondary | ICD-10-CM | POA: Diagnosis not present

## 2021-07-31 DIAGNOSIS — J449 Chronic obstructive pulmonary disease, unspecified: Secondary | ICD-10-CM | POA: Diagnosis not present

## 2021-07-31 DIAGNOSIS — E1165 Type 2 diabetes mellitus with hyperglycemia: Secondary | ICD-10-CM | POA: Diagnosis not present

## 2021-08-04 DIAGNOSIS — J449 Chronic obstructive pulmonary disease, unspecified: Secondary | ICD-10-CM | POA: Diagnosis not present

## 2021-08-04 DIAGNOSIS — G4733 Obstructive sleep apnea (adult) (pediatric): Secondary | ICD-10-CM | POA: Diagnosis not present

## 2021-08-12 ENCOUNTER — Other Ambulatory Visit: Payer: Self-pay | Admitting: "Endocrinology

## 2021-08-13 ENCOUNTER — Ambulatory Visit (HOSPITAL_COMMUNITY): Payer: Medicare HMO

## 2021-08-17 DIAGNOSIS — E1159 Type 2 diabetes mellitus with other circulatory complications: Secondary | ICD-10-CM | POA: Diagnosis not present

## 2021-08-25 DIAGNOSIS — R070 Pain in throat: Secondary | ICD-10-CM | POA: Diagnosis not present

## 2021-08-25 DIAGNOSIS — K219 Gastro-esophageal reflux disease without esophagitis: Secondary | ICD-10-CM | POA: Diagnosis not present

## 2021-08-31 DIAGNOSIS — J449 Chronic obstructive pulmonary disease, unspecified: Secondary | ICD-10-CM | POA: Diagnosis not present

## 2021-08-31 DIAGNOSIS — I10 Essential (primary) hypertension: Secondary | ICD-10-CM | POA: Diagnosis not present

## 2021-08-31 DIAGNOSIS — E1165 Type 2 diabetes mellitus with hyperglycemia: Secondary | ICD-10-CM | POA: Diagnosis not present

## 2021-09-07 ENCOUNTER — Ambulatory Visit: Payer: Medicare HMO | Admitting: Pulmonary Disease

## 2021-09-08 ENCOUNTER — Encounter: Payer: Self-pay | Admitting: Internal Medicine

## 2021-09-08 ENCOUNTER — Telehealth: Payer: Self-pay | Admitting: *Deleted

## 2021-09-08 ENCOUNTER — Other Ambulatory Visit: Payer: Self-pay

## 2021-09-08 ENCOUNTER — Ambulatory Visit (HOSPITAL_COMMUNITY)
Admission: RE | Admit: 2021-09-08 | Discharge: 2021-09-08 | Disposition: A | Discharge status: Home / Self Care | Payer: Medicare HMO | Source: Ambulatory Visit | Attending: Pulmonary Disease | Admitting: Pulmonary Disease

## 2021-09-08 DIAGNOSIS — R911 Solitary pulmonary nodule: Secondary | ICD-10-CM | POA: Insufficient documentation

## 2021-09-08 DIAGNOSIS — J439 Emphysema, unspecified: Secondary | ICD-10-CM | POA: Diagnosis not present

## 2021-09-08 DIAGNOSIS — I700 Atherosclerosis of aorta: Secondary | ICD-10-CM | POA: Diagnosis not present

## 2021-09-08 NOTE — Telephone Encounter (Signed)
RETURNED PATIENT'S PHONE CALL, SPOKE WITH PATIENT AND INFORMED THAT I DO NOT HAVE ANY AVAILABLE AFTERNOON FU SLOT ON 09-17-21, PATIENT VERIFIED UNDERSTANDING THIS

## 2021-09-11 DIAGNOSIS — I480 Paroxysmal atrial fibrillation: Secondary | ICD-10-CM | POA: Diagnosis not present

## 2021-09-11 DIAGNOSIS — I25708 Atherosclerosis of coronary artery bypass graft(s), unspecified, with other forms of angina pectoris: Secondary | ICD-10-CM | POA: Diagnosis not present

## 2021-09-11 DIAGNOSIS — I10 Essential (primary) hypertension: Secondary | ICD-10-CM | POA: Diagnosis not present

## 2021-09-11 DIAGNOSIS — R0602 Shortness of breath: Secondary | ICD-10-CM | POA: Diagnosis not present

## 2021-09-14 DIAGNOSIS — Z0000 Encounter for general adult medical examination without abnormal findings: Secondary | ICD-10-CM | POA: Diagnosis not present

## 2021-09-14 DIAGNOSIS — Z6833 Body mass index (BMI) 33.0-33.9, adult: Secondary | ICD-10-CM | POA: Diagnosis not present

## 2021-09-14 DIAGNOSIS — E782 Mixed hyperlipidemia: Secondary | ICD-10-CM | POA: Diagnosis not present

## 2021-09-14 DIAGNOSIS — Z23 Encounter for immunization: Secondary | ICD-10-CM | POA: Diagnosis not present

## 2021-09-14 DIAGNOSIS — E559 Vitamin D deficiency, unspecified: Secondary | ICD-10-CM | POA: Diagnosis not present

## 2021-09-14 DIAGNOSIS — Z1331 Encounter for screening for depression: Secondary | ICD-10-CM | POA: Diagnosis not present

## 2021-09-14 DIAGNOSIS — E6609 Other obesity due to excess calories: Secondary | ICD-10-CM | POA: Diagnosis not present

## 2021-09-14 DIAGNOSIS — E538 Deficiency of other specified B group vitamins: Secondary | ICD-10-CM | POA: Diagnosis not present

## 2021-09-15 ENCOUNTER — Ambulatory Visit: Payer: Medicare HMO | Admitting: Gastroenterology

## 2021-09-15 ENCOUNTER — Telehealth: Payer: Self-pay

## 2021-09-15 ENCOUNTER — Encounter: Payer: Self-pay | Admitting: Gastroenterology

## 2021-09-15 ENCOUNTER — Other Ambulatory Visit: Payer: Self-pay

## 2021-09-15 VITALS — BP 124/79 | HR 107 | Temp 97.3°F | Ht 70.0 in | Wt 320.8 lb

## 2021-09-15 DIAGNOSIS — D126 Benign neoplasm of colon, unspecified: Secondary | ICD-10-CM | POA: Diagnosis not present

## 2021-09-15 DIAGNOSIS — K2270 Barrett's esophagus without dysplasia: Secondary | ICD-10-CM

## 2021-09-15 NOTE — Telephone Encounter (Signed)
Will route to PharmD for rec's re: holding anticoagulation. Richardson Dopp, PA-C    09/15/2021 3:02 PM

## 2021-09-15 NOTE — Progress Notes (Signed)
Referring Provider: Sharilyn Sites, MD Primary Care Physician:  Sharilyn Sites, MD Primary GI: Dr. Abbey Chatters  Chief Complaint  Patient presents with   Colonoscopy    Wants to schedule tcs. BS has been 200-300's    HPI:   [name] is a 60 y.o. male presenting today with a history of GERD, Barrett's, last EGD in 2019 with surveillance due in 2026. Multiple adenomas in 2018 and overdue for surveillance. Has been undergoing radiation treatments for lung cancer several months ago. Recently saw cardiology 09/11/21. Will see cardiologist again in 3 months. Sees Pulmonology in December.   On Eliquis for afib. States he has had a pain in his right arm for about 2-3 months. Comes up to top of his shoulder/neck. +DOE. Worsening. Wears CPAP at night. No oxygen during the day.   No abdominal pain, N/V. No overt GI bleeding. Protonix BID.     Past Medical History:  Diagnosis Date   Abnormal myocardial perfusion study 01/01/2011   there a small to moderate sized inferobasal scar   Barrett's esophagus    Cataracts, bilateral    Chronic kidney disease    hx of kidney stones   Claudication ([place]) 11/16/2011   PV test perform shows normal   COPD (chronic obstructive pulmonary disease) (HCC)    Depression    Diabetes ([place])    type 2 diabetes mellitus   Dysrhythmia    GERD (gastroesophageal reflux disease)    Glaucoma    Hernia of abdominal wall    History of radiation therapy    Right lung, SBRT- 04/16/21-04/30/21- Dr. Gery Pray   HTN (hypertension)    Hyperlipidemia    Morbid obesity ([place])    Myocardial infarction ([place]) 2008,2009,2009   OSA (obstructive sleep apnea)    on cpap   PAF (paroxysmal atrial fibrillation) ([place])    S/P colonoscopy 2009   3-4 mm transverse colon erosions likely secondary to  ASA   S/P endoscopy 10/2010   moderate erosive gastritis, Barrett's esophagus 1-2cm   Sleep apnea    SOB (shortness of breath) 11/03/2007   2D Echo EF 50%-55%   Stroke  [facility])     Past Surgical History:  Procedure Laterality Date   BIOPSY N/A 04/29/2015   Procedure: BIOPSY;  Surgeon: Danie Binder, MD;  Location: [facility];  Service: Endoscopy;  Laterality: N/A;   BIOPSY  08/01/2018   Procedure: BIOPSY;  Surgeon: Danie Binder, MD;  Location: [facility];  Service: Endoscopy;;  esophageal   CABG X 4  03/2008   CARDIAC CATHETERIZATION  2009   stent placement to the left circumflex a 2.25    CARDIAC CATHETERIZATION  07/08/2010   CARDIAC CATHETERIZATION N/A 11/02/2019   COLONOSCOPY WITH PROPOFOL N/A 09/27/2017   normal ileum, twenty 4 to 8 mm polyps in the sigmoid colon, descending colon, splenic flexure, transverse colon, ascending colon, cecum.  An additional three 2 to 4 mm polyps in the rectum and the descending colon.  Diverticulosis, internal hemorrhoids.  Surgical pathology found the polyps to be one fragment of hyperplastic polyp and 22 fragments of tubular adenoma..  Recommended repeat colonoscop   CORONARY ARTERY BYPASS GRAFT  2008   4 vessels   CORONARY STENT PLACEMENT  12/29/12   CORONARY STENT PLACEMENT  12/2012   ESOPHAGEAL DILATION N/A 04/29/2015   Procedure: ESOPHAGEAL DILATION 15 mm, 16 mm;  Surgeon: Danie Binder, MD;  Location: [facility];  Service: Endoscopy;  Laterality: N/A;  ESOPHAGOGASTRODUODENOSCOPY  09/2011   Barrett's esophagus, no dysplasia on biopsies. Distal esophagitis. Status post dilation. Moderate gastritis and duodenitis, but biopsies benign. Next EGD in November 2015 for surveillance of Barrett's esophagus.   ESOPHAGOGASTRODUODENOSCOPY (EGD) WITH PROPOFOL N/A 04/29/2015   SLF: 1. Barretts esophagus 2. Moderate non-erosive gastritis.    ESOPHAGOGASTRODUODENOSCOPY (EGD) WITH PROPOFOL N/A 08/01/2018   Barrett's, repeat in 5 years. Empiric dilatation, mild gastritis   HERNIA REPAIR     ventral hernia repair   LEFT HEART CATHETERIZATION WITH CORONARY/GRAFT ANGIOGRAM N/A 12/29/2012   Procedure: LEFT HEART CATHETERIZATION WITH  Beatrix Fetters;  Surgeon: Troy Sine, MD;  Location: [facility] CATH LAB;  Service: Cardiovascular;  Laterality: N/A;   ORIF FIBULA FRACTURE Right 06/03/2015   Procedure: OPEN REDUCTION INTERNAL FIXATION (ORIF) DISTAL FIBULA  FRACTURE;  Surgeon: Garald Balding, MD;  Location: [place];  Service: Orthopedics;  Laterality: Right;   PERCUTANEOUS CORONARY STENT INTERVENTION (PCI-S)  12/29/2012   Procedure: PERCUTANEOUS CORONARY STENT INTERVENTION (PCI-S);  Surgeon: Troy Sine, MD;  [facility] C[facility] LAB;  Service: Cardiovascular;;   POLYPECTOMY  09/27/2017   Procedure: POLYPECTOMY;  Surgeon: Danie Binder, MD;  [facility];  Service: Endoscopy;;  cecal polyp, ascending polyps x6, transverse colon polyps x6, splenic flexure polyps x2, descending colon polyps x6, sigmoid  colon polyp x1, rectal polyp x1    SAVORY DILATION  09/06/2011   Procedure: SAVORY DILATION;  Surgeon: Dorothyann Peng, MD;  Location: [facility];  Service: Endoscopy;  Laterality: N/A;  Dilated with 49mm   SAVORY DILATION N/A 08/01/2018   Procedure: SAVORY DILATION;  Surgeon: Danie Binder, MD;  [facility];  Service: Endoscopy;  Laterality: N/A;    Current Outpatient Medications  Medication Sig Dispense Refill   albuterol (VENTOLIN HFA) 108 (90 Base) MCG/ACT inhaler INHALE TWO PUFFS into THE lungs EVERY 6 HOURS AS NEEDED FOR SHORTNESS OF BREATH OR wheezing 8.5 g 3   amLODipine (NORVASC) 10 MG tablet Take 10 mg by mouth at bedtime.     apixaban (ELIQUIS) 5 MG TABS tablet Take 1 tablet (5 mg total) by mouth 2 (two) times daily. 1 tablet 0   blood glucose meter kit and supplies Dispense based on patient and insurance preference. Use up to two times daily as directed. (FOR ICD-E11.65) 1 each 5   Blood Glucose Monitoring Suppl (ONETOUCH VERIO) w/Device KIT 1 each by Does not apply route as needed. 1 kit 0   Continuous Blood Gluc Receiver (FREESTYLE LIBRE 2 READER) DEVI As directed 1 each 0   Continuous Blood  Gluc Sensor (FREESTYLE LIBRE 2 SENSOR) MISC 1 Piece by Does not apply route every 14 (fourteen) days. 2 each 3   Cyanocobalamin (VITAMIN B-12 IJ) Inject 1,000 mcg as directed every 30 (thirty) days.     diazepam (VALIUM) 5 MG tablet Take 5 mg by mouth 2 (two) times daily.     Dulaglutide (TRULICITY) 1.5 WP/7.9YI SOPN Inject 1.5 mg into the skin once a week. 2 mL 2   escitalopram (LEXAPRO) 20 MG tablet Take 20 mg daily by mouth.      fenofibrate (TRICOR) 145 MG tablet Take 145 mg by mouth daily.     Fluticasone-Umeclidin-Vilant 100-62.5-25 MCG/INH AEPB Inhale 1 puff daily into the lungs.      furosemide (LASIX) 80 MG tablet Take 80 mg by mouth daily.  4   glipiZIDE (GLUCOTROL XL) 5 MG 24 hr tablet Take 1 tablet (5 mg total) by mouth  daily with breakfast. 180 tablet 2   insulin regular human CONCENTRATED (HUMULIN R U-500 KWIKPEN) 500 UNIT/ML kwikpen Inject 70 Units into the skin 3 (three) times daily with meals. Only when blood glucose is above 90 and eating. 42 mL 3   isosorbide mononitrate (IMDUR) 120 MG 24 hr tablet Take 120 mg by mouth daily.     Lancets (ONETOUCH DELICA PLUS [id number]) MISC USE TO check blood sugar FOUR TIMES DAILY AS DIRECTED 200 each 1   lisinopril (ZESTRIL) 10 MG tablet Take 1 tablet (10 mg total) by mouth daily. 90 tablet 1   meclizine (ANTIVERT) 25 MG tablet Take 25 mg by mouth as needed.     metoprolol tartrate (LOPRESSOR) 100 MG tablet Take 1 tablet (100 mg total) by mouth 2 (two) times daily. OFFICE VISIT NEEDED BEFORE ADDITIONAL REFILLS 28 tablet 0   modafinil (PROVIGIL) 200 MG tablet Take 200 mg by mouth daily.     nitroGLYCERIN (NITROLINGUAL) 0.4 MG/SPRAY spray Place 1 spray under the tongue every 5 (five) minutes x 3 doses as needed for chest pain. 12 g 0   ONETOUCH VERIO test strip USE TO check blood sugar FOUR TIMES DAILY AS DIRECTED 200 strip 1   pantoprazole (PROTONIX) 40 MG tablet Take 1 tablet (40 mg total) by mouth 2 (two) times daily before a meal. 180 tablet  3   potassium chloride SA (K-DUR,KLOR-CON) 20 MEQ tablet Take 1 tablet (20 mEq total) by mouth 2 (two) times daily. 30 tablet 0   rosuvastatin (CRESTOR) 20 MG tablet Take 20 mg by mouth daily.      TRUEPLUS PEN NEEDLES 31G X 8 MM MISC USE AS DIRECTED TWICE DAILY 100 each 3   Vitamin D, Ergocalciferol, (DRISDOL) 1.25 MG (50000 UNIT) CAPS capsule Take 1 capsule (50,000 Units total) by mouth every 7 (seven) days. 12 capsule 0   No current facility-administered medications for this visit.    Allergies as of 09/15/2021 - Review Complete 09/15/2021  Allergen Reaction Noted   Contrast media [iodinated diagnostic agents] Other (See Comments) 03/14/2013   Iohexol Other (See Comments) 01/02/2008    Family History  Problem Relation Age of Onset   Diabetes Mother    Heart disease Mother    Diabetes Father    Heart disease Father    Pancreatic cancer Brother        age 57, doing well   Colon cancer Neg Hx    Anesthesia problems Neg Hx    Hypotension Neg Hx    Malignant hyperthermia Neg Hx    Pseudochol deficiency Neg Hx     Social History   Socioeconomic History   Marital status: Divorced    Spouse name: Not on file   Number of children: Not on file   Years of education: Not on file   Highest education level: Not on file  Occupational History   Not on file  Tobacco Use   Smoking status: Every Day    Packs/day: 0.50    Years: 30.00    Pack years: 15.00    Types: Cigarettes   Smokeless tobacco: Never   Tobacco comments:    smokes 1/2 pack per day 09/15/21  Vaping Use   Vaping Use: Never used  Substance and Sexual Activity   Alcohol use: Yes    Comment: occ beer   Drug use: No   Sexual activity: Never  Other Topics Concern   Not on file  Social History Narrative   Not on file  Social Determinants of Health   Financial Resource Strain: Not on file  Food Insecurity: Not on file  Transportation Needs: Not on file  Physical Activity: Not on file  Stress: Not on file   Social Connections: Not on file    Review of Systems: Gen: Denies fever, chills, anorexia. Denies fatigue, weakness, weight loss.  CV: Denies chest pain, palpitations, syncope, peripheral edema, and claudication. Resp: see HPI GI: see HPI Derm: Denies rash, itching, dry skin Psych: Denies depression, anxiety, memory loss, confusion. No homicidal or suicidal ideation.  Heme: Denies bruising, bleeding, and enlarged lymph nodes.  Physical Exam: BP 124/79   Pulse (!) 107   Temp (!) 97.3 F (36.3 C) (Temporal)   Ht 5' 10" (1.778 m)   Wt (!) 320 lb 12.8 oz (145.5 kg)   BMI 46.03 kg/m  General:   Alert and oriented. No distress noted. Pleasant and cooperative.  Head:  Normocephalic and atraumatic. Eyes:  Conjuctiva clear without scleral icterus. Mouth: mask in place Cardiac: S1 S2 present  Lungs: scattered expiratory wheeze Abdomen:  +BS, soft, markedly obese, unable to appreciate HSM due to large AP diameter.  Msk:  Symmetrical without gross deformities. Normal posture. Extremities:  Without edema. Neurologic:  Alert and  oriented x4 Psych:  Alert and cooperative. Normal mood and affect.  ASSESSMENT: [name] is a 60 y.o. male presenting today with a history of GERD, Barrett's, last EGD in 2019 with surveillance due in 2026. Multiple adenomas in 2018 and overdue for surveillance. Has been undergoing radiation treatments for lung cancer several months ago. Recently saw cardiology 09/11/21. Will see cardiologist again in 3 months. Sees Pulmonology in December.   No concerning lower or upper GI signs/symptoms. He will need clearance from cardiology and pulmonary prior to pursuing procedures. Will also need to hold Eliquis for 48 hours prior.   Will reach out to cardiology and pulmonology. He will be an ASA 4 due to comorbidities.    PLAN:  Obtain clearance from pulmonary and cardiology Hold Eliquis X 48 hours prior Proceed with colonoscopy by Dr. Abbey Chatters  in near future:  the risks, benefits, and alternatives have been discussed with the patient in detail. The patient states understanding and desires to proceed.  Further recommendations to follow   Annitta Needs, PhD, ANP-BC [facility]

## 2021-09-15 NOTE — Telephone Encounter (Signed)
Attention: Preop   We would like to request holding the following medication for patient please.  Procedure: Colonoscopy  Date: TBD  Medication to hold: Eliquis x 48 hrs  Surgeon: Dr. Abbey Chatters  Phone: [phone]  Fax:  [phone]  Type of Anesthesia: Propofol

## 2021-09-15 NOTE — Patient Instructions (Signed)
We need to get clearance from cardiology and pulmonology before the colonoscopy.  Once cleared, we can move forward with a colonoscopy!  We will need to stop Eliquis 2 days before the procedure.  Further recommendations to follow!  I enjoyed seeing you again today! As you know, I value our relationship and want to provide genuine, compassionate, and quality care. I welcome your feedback. If you receive a survey regarding your visit,  I greatly appreciate you taking time to fill this out. See you next time!  Annitta Needs, PhD, ANP-BC [facility]

## 2021-09-15 NOTE — Telephone Encounter (Signed)
We would like to request clearance from a pulmonology standpoint for the following procedure.   Procedure: colonoscopy  Date: TBD  Medication to hold:   Surgeon: Dr. Abbey Chatters  Phone: [phone]  Fax:  [phone]  Type of Anesthesia: Propofol

## 2021-09-15 NOTE — H&P (View-Only) (Signed)
Referring Provider: Sharilyn Sites, MD Primary Care Physician:  Sharilyn Sites, MD Primary GI: Dr. Abbey Chatters  Chief Complaint  Patient presents with   Colonoscopy    Wants to schedule tcs. BS has been 200-300's    HPI:   [name] is a 60 y.o. male presenting today with a history of GERD, Barrett's, last EGD in 2019 with surveillance due in 2026. Multiple adenomas in 2018 and overdue for surveillance. Has been undergoing radiation treatments for lung cancer several months ago. Recently saw cardiology 09/11/21. Will see cardiologist again in 3 months. Sees Pulmonology in December.   On Eliquis for afib. States he has had a pain in his right arm for about 2-3 months. Comes up to top of his shoulder/neck. +DOE. Worsening. Wears CPAP at night. No oxygen during the day.   No abdominal pain, N/V. No overt GI bleeding. Protonix BID.     Past Medical History:  Diagnosis Date   Abnormal myocardial perfusion study 01/01/2011   there a small to moderate sized inferobasal scar   Barrett's esophagus    Cataracts, bilateral    Chronic kidney disease    hx of kidney stones   Claudication ([place]) 11/16/2011   PV test perform shows normal   COPD (chronic obstructive pulmonary disease) (HCC)    Depression    Diabetes ([place])    type 2 diabetes mellitus   Dysrhythmia    GERD (gastroesophageal reflux disease)    Glaucoma    Hernia of abdominal wall    History of radiation therapy    Right lung, SBRT- 04/16/21-04/30/21- Dr. Gery Pray   HTN (hypertension)    Hyperlipidemia    Morbid obesity ([place])    Myocardial infarction ([place]) 2008,2009,2009   OSA (obstructive sleep apnea)    on cpap   PAF (paroxysmal atrial fibrillation) ([place])    S/P colonoscopy 2009   3-4 mm transverse colon erosions likely secondary to  ASA   S/P endoscopy 10/2010   moderate erosive gastritis, Barrett's esophagus 1-2cm   Sleep apnea    SOB (shortness of breath) 11/03/2007   2D Echo EF 50%-55%   Stroke  [facility])     Past Surgical History:  Procedure Laterality Date   BIOPSY N/A 04/29/2015   Procedure: BIOPSY;  Surgeon: Danie Binder, MD;  Location: [facility] ORS;  Service: Endoscopy;  Laterality: N/A;   BIOPSY  08/01/2018   Procedure: BIOPSY;  Surgeon: Danie Binder, MD;  Location: [facility] ENDO SUITE;  Service: Endoscopy;;  esophageal   CABG X 4  03/2008   CARDIAC CATHETERIZATION  2009   stent placement to the left circumflex a 2.25    CARDIAC CATHETERIZATION  07/08/2010   CARDIAC CATHETERIZATION N/A 11/02/2019   COLONOSCOPY WITH PROPOFOL N/A 09/27/2017   normal ileum, twenty 4 to 8 mm polyps in the sigmoid colon, descending colon, splenic flexure, transverse colon, ascending colon, cecum.  An additional three 2 to 4 mm polyps in the rectum and the descending colon.  Diverticulosis, internal hemorrhoids.  Surgical pathology found the polyps to be one fragment of hyperplastic polyp and 22 fragments of tubular adenoma..  Recommended repeat colonoscop   CORONARY ARTERY BYPASS GRAFT  2008   4 vessels   CORONARY STENT PLACEMENT  12/29/12   CORONARY STENT PLACEMENT  12/2012   ESOPHAGEAL DILATION N/A 04/29/2015   Procedure: ESOPHAGEAL DILATION 15 mm, 16 mm;  Surgeon: Danie Binder, MD;  Location: [facility];  Service: Endoscopy;  Laterality: N/A;  ESOPHAGOGASTRODUODENOSCOPY  09/2011   Barrett's esophagus, no dysplasia on biopsies. Distal esophagitis. Status post dilation. Moderate gastritis and duodenitis, but biopsies benign. Next EGD in November 2015 for surveillance of Barrett's esophagus.   ESOPHAGOGASTRODUODENOSCOPY (EGD) WITH PROPOFOL N/A 04/29/2015   SLF: 1. Barretts esophagus 2. Moderate non-erosive gastritis.    ESOPHAGOGASTRODUODENOSCOPY (EGD) WITH PROPOFOL N/A 08/01/2018   Barrett's, repeat in 5 years. Empiric dilatation, mild gastritis   HERNIA REPAIR     ventral hernia repair   LEFT HEART CATHETERIZATION WITH CORONARY/GRAFT ANGIOGRAM N/A 12/29/2012   Procedure: LEFT HEART CATHETERIZATION WITH  Beatrix Fetters;  Surgeon: Troy Sine, MD;  Location: [facility] CATH LAB;  Service: Cardiovascular;  Laterality: N/A;   ORIF FIBULA FRACTURE Right 06/03/2015   Procedure: OPEN REDUCTION INTERNAL FIXATION (ORIF) DISTAL FIBULA  FRACTURE;  Surgeon: Garald Balding, MD;  Location: [place];  Service: Orthopedics;  Laterality: Right;   PERCUTANEOUS CORONARY STENT INTERVENTION (PCI-S)  12/29/2012   Procedure: PERCUTANEOUS CORONARY STENT INTERVENTION (PCI-S);  Surgeon: Troy Sine, MD;  Location: [facility] CATH LAB;  Service: Cardiovascular;;   POLYPECTOMY  09/27/2017   Procedure: POLYPECTOMY;  Surgeon: Danie Binder, MD;  [facility];  Service: Endoscopy;;  cecal polyp, ascending polyps x6, transverse colon polyps x6, splenic flexure polyps x2, descending colon polyps x6, sigmoid  colon polyp x1, rectal polyp x1    SAVORY DILATION  09/06/2011   Procedure: SAVORY DILATION;  Surgeon: Dorothyann Peng, MD;  Location: [facility];  Service: Endoscopy;  Laterality: N/A;  Dilated with 7mm   SAVORY DILATION N/A 08/01/2018   Procedure: SAVORY DILATION;  Surgeon: Danie Binder, MD;  [facility];  Service: Endoscopy;  Laterality: N/A;    Current Outpatient Medications  Medication Sig Dispense Refill   albuterol (VENTOLIN HFA) 108 (90 Base) MCG/ACT inhaler INHALE TWO PUFFS into THE lungs EVERY 6 HOURS AS NEEDED FOR SHORTNESS OF BREATH OR wheezing 8.5 g 3   amLODipine (NORVASC) 10 MG tablet Take 10 mg by mouth at bedtime.     apixaban (ELIQUIS) 5 MG TABS tablet Take 1 tablet (5 mg total) by mouth 2 (two) times daily. 1 tablet 0   blood glucose meter kit and supplies Dispense based on patient and insurance preference. Use up to two times daily as directed. (FOR ICD-E11.65) 1 each 5   Blood Glucose Monitoring Suppl (ONETOUCH VERIO) w/Device KIT 1 each by Does not apply route as needed. 1 kit 0   Continuous Blood Gluc Receiver (FREESTYLE LIBRE 2 READER) DEVI As directed 1 each 0   Continuous Blood  Gluc Sensor (FREESTYLE LIBRE 2 SENSOR) MISC 1 Piece by Does not apply route every 14 (fourteen) days. 2 each 3   Cyanocobalamin (VITAMIN B-12 IJ) Inject 1,000 mcg as directed every 30 (thirty) days.     diazepam (VALIUM) 5 MG tablet Take 5 mg by mouth 2 (two) times daily.     Dulaglutide (TRULICITY) 1.5 DE/0.8XK SOPN Inject 1.5 mg into the skin once a week. 2 mL 2   escitalopram (LEXAPRO) 20 MG tablet Take 20 mg daily by mouth.      fenofibrate (TRICOR) 145 MG tablet Take 145 mg by mouth daily.     Fluticasone-Umeclidin-Vilant 100-62.5-25 MCG/INH AEPB Inhale 1 puff daily into the lungs.      furosemide (LASIX) 80 MG tablet Take 80 mg by mouth daily.  4   glipiZIDE (GLUCOTROL XL) 5 MG 24 hr tablet Take 1 tablet (5 mg total) by mouth  daily with breakfast. 180 tablet 2   insulin regular human CONCENTRATED (HUMULIN R U-500 KWIKPEN) 500 UNIT/ML kwikpen Inject 70 Units into the skin 3 (three) times daily with meals. Only when blood glucose is above 90 and eating. 42 mL 3   isosorbide mononitrate (IMDUR) 120 MG 24 hr tablet Take 120 mg by mouth daily.     Lancets (ONETOUCH DELICA PLUS LANCET33G) MISC USE TO check blood sugar FOUR TIMES DAILY AS DIRECTED 200 each 1   lisinopril (ZESTRIL) 10 MG tablet Take 1 tablet (10 mg total) by mouth daily. 90 tablet 1   meclizine (ANTIVERT) 25 MG tablet Take 25 mg by mouth as needed.     metoprolol tartrate (LOPRESSOR) 100 MG tablet Take 1 tablet (100 mg total) by mouth 2 (two) times daily. OFFICE VISIT NEEDED BEFORE ADDITIONAL REFILLS 28 tablet 0   modafinil (PROVIGIL) 200 MG tablet Take 200 mg by mouth daily.     nitroGLYCERIN (NITROLINGUAL) 0.4 MG/SPRAY spray Place 1 spray under the tongue every 5 (five) minutes x 3 doses as needed for chest pain. 12 g 0   ONETOUCH VERIO test strip USE TO check blood sugar FOUR TIMES DAILY AS DIRECTED 200 strip 1   pantoprazole (PROTONIX) 40 MG tablet Take 1 tablet (40 mg total) by mouth 2 (two) times daily before a meal. 180 tablet  3   potassium chloride SA (K-DUR,KLOR-CON) 20 MEQ tablet Take 1 tablet (20 mEq total) by mouth 2 (two) times daily. 30 tablet 0   rosuvastatin (CRESTOR) 20 MG tablet Take 20 mg by mouth daily.      TRUEPLUS PEN NEEDLES 31G X 8 MM MISC USE AS DIRECTED TWICE DAILY 100 each 3   Vitamin D, Ergocalciferol, (DRISDOL) 1.25 MG (50000 UNIT) CAPS capsule Take 1 capsule (50,000 Units total) by mouth every 7 (seven) days. 12 capsule 0   No current facility-administered medications for this visit.    Allergies as of 09/15/2021 - Review Complete 09/15/2021  Allergen Reaction Noted   Contrast media [iodinated diagnostic agents] Other (See Comments) 03/14/2013   Iohexol Other (See Comments) 01/02/2008    Family History  Problem Relation Age of Onset   Diabetes Mother    Heart disease Mother    Diabetes Father    Heart disease Father    Pancreatic cancer Brother        age 16, doing well   Colon cancer Neg Hx    Anesthesia problems Neg Hx    Hypotension Neg Hx    Malignant hyperthermia Neg Hx    Pseudochol deficiency Neg Hx     Social History   Socioeconomic History   Marital status: Divorced    Spouse name: Not on file   Number of children: Not on file   Years of education: Not on file   Highest education level: Not on file  Occupational History   Not on file  Tobacco Use   Smoking status: Every Day    Packs/day: 0.50    Years: 30.00    Pack years: 15.00    Types: Cigarettes   Smokeless tobacco: Never   Tobacco comments:    smokes 1/2 pack per day 09/15/21  Vaping Use   Vaping Use: Never used  Substance and Sexual Activity   Alcohol use: Yes    Comment: occ beer   Drug use: No   Sexual activity: Never  Other Topics Concern   Not on file  Social History Narrative   Not on file  Social Determinants of Health   Financial Resource Strain: Not on file  Food Insecurity: Not on file  Transportation Needs: Not on file  Physical Activity: Not on file  Stress: Not on file   Social Connections: Not on file    Review of Systems: Gen: Denies fever, chills, anorexia. Denies fatigue, weakness, weight loss.  CV: Denies chest pain, palpitations, syncope, peripheral edema, and claudication. Resp: see HPI GI: see HPI Derm: Denies rash, itching, dry skin Psych: Denies depression, anxiety, memory loss, confusion. No homicidal or suicidal ideation.  Heme: Denies bruising, bleeding, and enlarged lymph nodes.  Physical Exam: BP 124/79   Pulse (!) 107   Temp (!) 97.3 F (36.3 C) (Temporal)   Ht 5' 10" (1.778 m)   Wt (!) 320 lb 12.8 oz (145.5 kg)   BMI 46.03 kg/m  General:   Alert and oriented. No distress noted. Pleasant and cooperative.  Head:  Normocephalic and atraumatic. Eyes:  Conjuctiva clear without scleral icterus. Mouth: mask in place Cardiac: S1 S2 present  Lungs: scattered expiratory wheeze Abdomen:  +BS, soft, markedly obese, unable to appreciate HSM due to large AP diameter.  Msk:  Symmetrical without gross deformities. Normal posture. Extremities:  Without edema. Neurologic:  Alert and  oriented x4 Psych:  Alert and cooperative. Normal mood and affect.  ASSESSMENT: [name] is a 60 y.o. male presenting today with a history of GERD, Barrett's, last EGD in 2019 with surveillance due in 2026. Multiple adenomas in 2018 and overdue for surveillance. Has been undergoing radiation treatments for lung cancer several months ago. Recently saw cardiology 09/11/21. Will see cardiologist again in 3 months. Sees Pulmonology in December.   No concerning lower or upper GI signs/symptoms. He will need clearance from cardiology and pulmonary prior to pursuing procedures. Will also need to hold Eliquis for 48 hours prior.   Will reach out to cardiology and pulmonology. He will be an ASA 4 due to comorbidities.    PLAN:  Obtain clearance from pulmonary and cardiology Hold Eliquis X 48 hours prior Proceed with colonoscopy by Dr. Abbey Chatters  in near future:  the risks, benefits, and alternatives have been discussed with the patient in detail. The patient states understanding and desires to proceed.  Further recommendations to follow   Annitta Needs, PhD, ANP-BC [facility]

## 2021-09-16 NOTE — Progress Notes (Signed)
Radiation Oncology         [phone]) [phone] ________________________________  Name: Ronnie CARSWELL MRN: [id number]  Date: 09/17/2021  DOB: [birth year]  Follow-Up Visit Note  CC: Sharilyn Sites, MD  Rigoberto Noel, MD    ICD-10-CM   1. Lung nodule  R91.1       Diagnosis:  Right upper lobe pulmonary nodule, presumptive clinical stage I non-small cell lung cancer  Interval Since Last Radiation: 4 months and 17 days   Intent: Curative  Radiation Treatment Dates: 04/16/2021 through 04/30/2021 Site Technique Total Dose (Gy) Dose per Fx (Gy) Completed Fx Beam Energies  Lung, Right: Lung_Rt IMRT 60/60 12 5/5 6XFFF    Narrative:  The patient returns today for routine follow-up, he was last seen here for follow-up on 06/01/21. Since his last visit, chest CT on 09/08/21 demonstrated an interval decrease in size of the treated lung nodule within the posterior lateral right upper lobe (measuring 0.7 cm, previously 1.5 cm). Additional small lung nodules were appreciated as unchanged in the interval as well. Incidental findings included aortic atherosclerosis, emphysema, and hepatic stenosis.     Otherwise, no significant interval history since the patient was last seen.            The patient reports ongoing problems with low blood pressure.  He denies any pain within the chest area significant cough or hemoptysis.   He continues to have significant dyspnea with exertion.                Allergies:  is allergic to contrast media [iodinated diagnostic agents] and iohexol.  Meds: Current Outpatient Medications  Medication Sig Dispense Refill   albuterol (VENTOLIN HFA) 108 (90 Base) MCG/ACT inhaler INHALE TWO PUFFS into THE lungs EVERY 6 HOURS AS NEEDED FOR SHORTNESS OF BREATH OR wheezing 8.5 g 3   amLODipine (NORVASC) 10 MG tablet Take 10 mg by mouth at bedtime.     apixaban (ELIQUIS) 5 MG TABS tablet Take 1 tablet (5 mg total) by mouth 2 (two) times daily. 1 tablet 0   blood glucose meter kit  and supplies Dispense based on patient and insurance preference. Use up to two times daily as directed. (FOR ICD-E11.65) 1 each 5   Blood Glucose Monitoring Suppl (ONETOUCH VERIO) w/Device KIT 1 each by Does not apply route as needed. 1 kit 0   Continuous Blood Gluc Receiver (FREESTYLE LIBRE 2 READER) DEVI As directed 1 each 0   Continuous Blood Gluc Sensor (FREESTYLE LIBRE 2 SENSOR) MISC 1 Piece by Does not apply route every 14 (fourteen) days. 2 each 3   diazepam (VALIUM) 5 MG tablet Take 5 mg by mouth 2 (two) times daily.     Dulaglutide (TRULICITY) 1.5 UU/7.2ZD SOPN Inject 1.5 mg into the skin once a week. 2 mL 2   escitalopram (LEXAPRO) 20 MG tablet Take 20 mg daily by mouth.      fenofibrate (TRICOR) 145 MG tablet Take 145 mg by mouth daily.     furosemide (LASIX) 80 MG tablet Take 80 mg by mouth daily.  4   glipiZIDE (GLUCOTROL XL) 5 MG 24 hr tablet Take 1 tablet (5 mg total) by mouth daily with breakfast. 180 tablet 2   insulin regular human CONCENTRATED (HUMULIN R U-500 KWIKPEN) 500 UNIT/ML kwikpen Inject 70 Units into the skin 3 (three) times daily with meals. Only when blood glucose is above 90 and eating. 42 mL 3   isosorbide mononitrate (IMDUR) 120 MG 24 hr tablet  Take 120 mg by mouth daily.     Lancets (ONETOUCH DELICA PLUS [id number]) MISC USE TO check blood sugar FOUR TIMES DAILY AS DIRECTED 200 each 1   lisinopril (ZESTRIL) 10 MG tablet Take 1 tablet (10 mg total) by mouth daily. 90 tablet 1   meclizine (ANTIVERT) 25 MG tablet Take 25 mg by mouth as needed.     metoprolol tartrate (LOPRESSOR) 100 MG tablet Take 1 tablet (100 mg total) by mouth 2 (two) times daily. OFFICE VISIT NEEDED BEFORE ADDITIONAL REFILLS 28 tablet 0   modafinil (PROVIGIL) 200 MG tablet Take 200 mg by mouth daily.     nitroGLYCERIN (NITROLINGUAL) 0.4 MG/SPRAY spray Place 1 spray under the tongue every 5 (five) minutes x 3 doses as needed for chest pain. 12 g 0   ONETOUCH VERIO test strip USE TO check blood sugar  FOUR TIMES DAILY AS DIRECTED 200 strip 1   pantoprazole (PROTONIX) 40 MG tablet Take 1 tablet (40 mg total) by mouth 2 (two) times daily before a meal. 180 tablet 3   potassium chloride SA (K-DUR,KLOR-CON) 20 MEQ tablet Take 1 tablet (20 mEq total) by mouth 2 (two) times daily. 30 tablet 0   rosuvastatin (CRESTOR) 20 MG tablet Take 20 mg by mouth daily.      TRUEPLUS PEN NEEDLES 31G X 8 MM MISC USE AS DIRECTED TWICE DAILY 100 each 3   Vitamin D, Ergocalciferol, (DRISDOL) 1.25 MG (50000 UNIT) CAPS capsule Take 1 capsule (50,000 Units total) by mouth every 7 (seven) days. 12 capsule 0   Cyanocobalamin (VITAMIN B-12 IJ) Inject 1,000 mcg as directed every 30 (thirty) days. (Patient not taking: Reported on 09/17/2021)     Fluticasone-Umeclidin-Vilant 100-62.5-25 MCG/INH AEPB Inhale 1 puff daily into the lungs.  (Patient not taking: Reported on 09/17/2021)     No current facility-administered medications for this encounter.    Physical Findings: The patient is in no acute distress. Patient is alert and oriented.  Accompanied by his mother on evaluation today  height is _0  (1.778 m) and weight is 322 lb 12.8 oz (146.4 kg) (abnormal). His temperature is 97.8 F (36.6 C). His blood pressure is 92/60 and his pulse is 88. His respiration is 28 (abnormal) and oxygen saturation is 95%. .  Lungs are clear to auscultation bilaterally. Heart has regular rate and rhythm. No palpable cervical, supraclavicular, or axillary adenopathy. Abdomen soft, non-tender, normal bowel sounds.   Lab Findings: Lab Results  Component Value Date   WBC 11.8 (H) 01/30/2019   HGB 15.9 01/30/2019   HCT 45.9 01/30/2019   MCV 92.7 01/30/2019   PLT 235 01/30/2019    Radiographic Findings: CT Chest Wo Contrast  Result Date: 09/09/2021 CLINICAL DATA:  Evaluate treated lung nodule. EXAM: CT CHEST WITHOUT CONTRAST TECHNIQUE: Multidetector CT imaging of the chest was performed following the standard protocol without IV  contrast. COMPARISON:  03/19/2021 FINDINGS: Cardiovascular: Status post median sternotomy and CABG procedure. Heart size appears within normal limits. No pericardial effusion. Aortic atherosclerosis. Mediastinum/Nodes: Normal appearance of the thyroid gland. The trachea appears patent and is midline. Normal appearance of the esophagus. No enlarged supraclavicular, axillary, mediastinal, or hilar lymph nodes. Lungs/Pleura: Moderate paraseptal and centrilobular emphysema. Treated lung nodule within the posterior lateral right upper lobe has decreased in size compared with the previous exam. On today's study this measures 0.7 cm, image 59/4. This is compared with 1.5 cm previously. Additional scattered lung nodules are again noted including: Anterior right upper lobe nodule measures  0.5 cm, image 48/4. Previously 0.5 cm. Right middle lobe lung nodule measures 0.8 cm, image 73/4. Unchanged from previous exam. Posterior left upper lobe lung nodule measures 4 mm, image 74/4. Unchanged from previous exam. Additional small subcentimeter lung nodules also appears stable in the interval Upper Abdomen: No acute abnormality. Musculoskeletal: Spondylosis identified within the thoracic spine. Chronic discontinuities throughout the median sternotomy wires with none union of the median sternotomy IMPRESSION: 1. Interval decrease in size of treated lung nodule within the posterior lateral right upper lobe. 2. Additional small lung nodules are unchanged in the interval. 3. Aortic Atherosclerosis (ICD10-I70.0) and Emphysema (ICD10-J43.9). 4. Hepatic steatosis. Electronically Signed   By: Kerby Moors M.D.   On: 09/09/2021 16:59    Impression:   Right upper lobe pulmonary nodule, presumptive clinical stage I non-small cell lung cancer  The patient tolerated his SBRT well.  No lasting side effects appreciated.  Recent chest CT scan shows favorable response to his SBRT.  Plan: The patient lives in the [place] area and is  difficult for him to come down to [facility] for follow-up.  He does see Dr. Elsworth Soho in [place] who has been ordering his chest CT scans.  In light of this I will defer follow-up to Dr. Elsworth Soho and ask that he keep me abreast of the patient's progress.  Patient will see Dr. Elsworth Soho for follow-up in December.   20 minutes of total time was spent for this patient encounter, including preparation, face-to-face counseling with the patient and coordination of care, physical exam, and documentation of the encounter. ____________________________________  Blair Promise, PhD, MD   This document serves as a record of services personally performed by Gery Pray, MD. It was created on his behalf by Roney Mans, a trained medical scribe. The creation of this record is based on the scribe's personal observations and the provider's statements to them. This document has been checked and approved by the attending provider.

## 2021-09-17 ENCOUNTER — Other Ambulatory Visit: Payer: Self-pay

## 2021-09-17 ENCOUNTER — Encounter: Payer: Self-pay | Admitting: *Deleted

## 2021-09-17 ENCOUNTER — Encounter: Payer: Self-pay | Admitting: Radiation Oncology

## 2021-09-17 ENCOUNTER — Ambulatory Visit
Admission: RE | Admit: 2021-09-17 | Discharge: 2021-09-17 | Disposition: A | Discharge status: Home / Self Care | Payer: Medicare HMO | Source: Ambulatory Visit | Attending: Radiation Oncology | Admitting: Radiation Oncology

## 2021-09-17 DIAGNOSIS — I700 Atherosclerosis of aorta: Secondary | ICD-10-CM | POA: Insufficient documentation

## 2021-09-17 DIAGNOSIS — Z08 Encounter for follow-up examination after completed treatment for malignant neoplasm: Secondary | ICD-10-CM | POA: Diagnosis not present

## 2021-09-17 DIAGNOSIS — R911 Solitary pulmonary nodule: Secondary | ICD-10-CM | POA: Diagnosis not present

## 2021-09-17 DIAGNOSIS — Z79899 Other long term (current) drug therapy: Secondary | ICD-10-CM | POA: Insufficient documentation

## 2021-09-17 DIAGNOSIS — Z7984 Long term (current) use of oral hypoglycemic drugs: Secondary | ICD-10-CM | POA: Diagnosis not present

## 2021-09-17 DIAGNOSIS — J439 Emphysema, unspecified: Secondary | ICD-10-CM | POA: Diagnosis not present

## 2021-09-17 DIAGNOSIS — K831 Obstruction of bile duct: Secondary | ICD-10-CM | POA: Diagnosis not present

## 2021-09-17 DIAGNOSIS — E538 Deficiency of other specified B group vitamins: Secondary | ICD-10-CM | POA: Diagnosis not present

## 2021-09-17 DIAGNOSIS — Z7901 Long term (current) use of anticoagulants: Secondary | ICD-10-CM | POA: Diagnosis not present

## 2021-09-17 DIAGNOSIS — E1159 Type 2 diabetes mellitus with other circulatory complications: Secondary | ICD-10-CM | POA: Diagnosis not present

## 2021-09-17 NOTE — Progress Notes (Signed)
COE ANGELOS is here today for follow up post radiation to the lung.  Lung Side: Right, patient completed treatment on 04/30/21.  Does the patient complain of any of the following: Pain:Patient reports having pain to right arm, rating 3/10. Shortness of breath w/wo exertion: Noted patient to be short of breath with ambulation. Cough: Patient reports having a dry cough.  Hemoptysis: no Pain with swallowing: no Swallowing/choking concerns: Patient reports getting choked on foods.  Appetite: Fair  Energy Level: reports having a low energy level.  Post radiation skin Changes: skin remains intact.     Additional comments if applicable:  Noted patient to have low blood pressure,. Patient reports having some dizziness, and visual changes. Patient currently taking Lasix 80mg  daily, amlodpine 10mg , isosorbide monotrite 120mg , lisinipril 10mg  and metoprolol tartrate 100mg .  Patient is diabetic and reports blood sugar was 130.  Vitals:   09/17/21 1019  BP: 92/60  Pulse: 88  Resp: (!) 28  Temp: 97.8 F (36.6 C)  SpO2: 95%  Weight: (!) 322 lb 12.8 oz (146.4 kg)  Height: 5\' 10"  (1.778 m)

## 2021-09-17 NOTE — Telephone Encounter (Signed)
This patient is followed by [facility] cardiology.  Preoperative team please send a message to the surgical team/office submitted the preoperative request.  He will need to have recommendations sent/made from [facility] cardiology.  Thank you.

## 2021-09-17 NOTE — Telephone Encounter (Signed)
I have faxed notes to requesting office. Will remove from the pre op call back pool.

## 2021-09-17 NOTE — Telephone Encounter (Signed)
Received fax.  Same information as in Epic.  Faxed request to Dr. Ayesha Rumpf office [phone]).  Called their office and they are awaiting our fax.

## 2021-09-17 NOTE — Telephone Encounter (Signed)
Noted. Still awaiting cardiac clearance

## 2021-09-17 NOTE — Telephone Encounter (Signed)
Routing to [employer] and [facility] clinical pool

## 2021-09-17 NOTE — Telephone Encounter (Signed)
Patient followed by [facility] cardiology.  Please forward request to them.

## 2021-09-21 NOTE — Telephone Encounter (Signed)
Phoned to [place] office @ [phone] and spoke with Angela Nevin and was advised that she spoke with the nurse and the Dr has not responded to it yet but she will fax or call us once this is completed (should be end of the day).

## 2021-09-21 NOTE — Telephone Encounter (Signed)
Pt has been approved to hold his Eliquis for 48 hours prior to procedure. Documentation has been sent to scan with Dr. Ave Filter signature.

## 2021-09-22 NOTE — Telephone Encounter (Signed)
Ronnie Noel, MD  He has severe OSA and COPD.  Okay to proceed with understanding of the risk and precautions for these conditions  -----------------   Please advise Dr. Abbey Chatters if okay to proceed? thanks

## 2021-09-23 ENCOUNTER — Other Ambulatory Visit: Payer: Self-pay

## 2021-09-23 DIAGNOSIS — E1159 Type 2 diabetes mellitus with other circulatory complications: Secondary | ICD-10-CM | POA: Diagnosis not present

## 2021-09-23 MED ORDER — PEG 3350-KCL-NA BICARB-NACL 420 G PO SOLR: 4000.0000 mL | ORAL | 0 refills | Status: DC

## 2021-09-23 NOTE — Telephone Encounter (Signed)
Pre-op appt 10/08/21. Appt letter mailed with procedure instructions.

## 2021-09-23 NOTE — Telephone Encounter (Signed)
LMOVM to call back to schedule TCS with Dr. Abbey Chatters, asa 4

## 2021-09-23 NOTE — Telephone Encounter (Signed)
Spoke to pt, TCS scheduled for 10/12/21 at 1:30pm. Advised pt to hold Eliquis for 48 hours before procedure. Rx for prep sent to pharmacy. Orders entered.

## 2021-09-23 NOTE — Telephone Encounter (Signed)
Okay to proceed.  Thank

## 2021-09-24 LAB — COMPREHENSIVE METABOLIC PANEL
ALT: 27 IU/L (ref 0–44)
AST: 25 IU/L (ref 0–40)
Albumin/Globulin Ratio: 1.5 (ref 1.2–2.2)
Albumin: 4.1 g/dL (ref 3.8–4.9)
Alkaline Phosphatase: 71 IU/L (ref 44–121)
BUN/Creatinine Ratio: 11 (ref 9–20)
BUN: 14 mg/dL (ref 6–24)
Bilirubin Total: 0.4 mg/dL (ref 0.0–1.2)
CO2: 21 mmol/L (ref 20–29)
Calcium: 9.9 mg/dL (ref 8.7–10.2)
Chloride: 101 mmol/L (ref 96–106)
Creatinine, Ser: 1.28 mg/dL — ABNORMAL HIGH (ref 0.76–1.27)
Globulin, Total: 2.8 g/dL (ref 1.5–4.5)
Glucose: 153 mg/dL — ABNORMAL HIGH (ref 70–99)
Potassium: 4.3 mmol/L (ref 3.5–5.2)
Sodium: 140 mmol/L (ref 134–144)
Total Protein: 6.9 g/dL (ref 6.0–8.5)
eGFR: 65 mL/min/{1.73_m2} (ref >59)

## 2021-09-24 LAB — TSH: TSH: 1.29 u[IU]/mL (ref 0.450–4.500)

## 2021-09-24 LAB — T4, FREE: Free T4: 1.6 ng/dL (ref 0.82–1.77)

## 2021-09-28 NOTE — Telephone Encounter (Signed)
[facility] clinical pool:  I do not see where cardiology gave clearance? We need to see this (appears it was scanned into chart but I did not see yet). Dena, do you have a copy?

## 2021-09-29 NOTE — Telephone Encounter (Signed)
I did another cardiac clearance letter to send to pre-op for this pt and asked them if this is something that needs to be added to the original letter/ documentation that they require .Waiting on a response. Faxed to Pre-op @ [phone].

## 2021-09-29 NOTE — Telephone Encounter (Signed)
Copy of patients documentation for holding Eliquis is on your desk.  Dena

## 2021-09-29 NOTE — Telephone Encounter (Signed)
Phoned to [facility] in [place], re-faxed Cardiac Clearance to Dr. Celine Ahr being more specific on clearance for this pt to be cleared for a colonoscopy. Waiting on response.

## 2021-09-29 NOTE — Telephone Encounter (Signed)
Phoned to [facility] in [place], spoke with Angela Nevin was advised that they had a copy of the paperwork I faxed them that Dr. Shirlee More signed off on. Will give to Roseanne Kaufman once fax gets here

## 2021-09-29 NOTE — Telephone Encounter (Signed)
Thanks, Dena.  All, documentation received was only for holding Eliquis. We need cardiac clearance (such as low risk, moderate, high risk). We already have pulmonary clearance.   [facility] clinical pool: FYI. We may need to postpone procedure if unable to have cardiac clearance in time. He recently was seen 09/11/21 by Cardiology, so it may not be an issue.

## 2021-09-30 DIAGNOSIS — I10 Essential (primary) hypertension: Secondary | ICD-10-CM | POA: Diagnosis not present

## 2021-09-30 DIAGNOSIS — J449 Chronic obstructive pulmonary disease, unspecified: Secondary | ICD-10-CM | POA: Diagnosis not present

## 2021-09-30 DIAGNOSIS — E1165 Type 2 diabetes mellitus with hyperglycemia: Secondary | ICD-10-CM | POA: Diagnosis not present

## 2021-10-06 ENCOUNTER — Ambulatory Visit: Payer: Medicare HMO | Admitting: "Endocrinology

## 2021-10-06 ENCOUNTER — Encounter: Payer: Self-pay | Admitting: "Endocrinology

## 2021-10-06 ENCOUNTER — Other Ambulatory Visit: Payer: Self-pay

## 2021-10-06 VITALS — BP 96/68 | HR 80 | Ht 70.0 in | Wt 324.8 lb

## 2021-10-06 DIAGNOSIS — I10 Essential (primary) hypertension: Secondary | ICD-10-CM | POA: Diagnosis not present

## 2021-10-06 DIAGNOSIS — E782 Mixed hyperlipidemia: Secondary | ICD-10-CM | POA: Diagnosis not present

## 2021-10-06 DIAGNOSIS — F17200 Nicotine dependence, unspecified, uncomplicated: Secondary | ICD-10-CM

## 2021-10-06 DIAGNOSIS — E1159 Type 2 diabetes mellitus with other circulatory complications: Secondary | ICD-10-CM | POA: Diagnosis not present

## 2021-10-06 DIAGNOSIS — R69 Illness, unspecified: Secondary | ICD-10-CM | POA: Diagnosis not present

## 2021-10-06 LAB — POCT GLYCOSYLATED HEMOGLOBIN (HGB A1C): HbA1c, POC (controlled diabetic range): 7.5 % — AB (ref 0.0–7.0)

## 2021-10-06 NOTE — Patient Instructions (Signed)

## 2021-10-06 NOTE — Progress Notes (Signed)
10/06/2021  Endocrinology follow-up note   Subjective:    Patient ID: [name], male    DOB: 1961/03/10.  he is being seen in follow-up  for management of currently uncontrolled symptomatic type 2 diabetes, hypertension, hyperlipidemia, obesity. PMD:  Sharilyn Sites, MD.   Past Medical History:  Diagnosis Date   Abnormal myocardial perfusion study 01/01/2011   there a small to moderate sized inferobasal scar   Barrett's esophagus    Cataracts, bilateral    Chronic kidney disease    hx of kidney stones   Claudication ([place]) 11/16/2011   PV test perform shows normal   COPD (chronic obstructive pulmonary disease) ([place])    Depression    Diabetes ([place])    type 2 diabetes mellitus   Dysrhythmia    GERD (gastroesophageal reflux disease)    Glaucoma    Hernia of abdominal wall    History of radiation therapy    Right lung, SBRT- 04/16/21-04/30/21- Dr. Gery Pray   HTN (hypertension)    Hyperlipidemia    Morbid obesity ([place])    Myocardial infarction ([place]) 2008,2009,2009   OSA (obstructive sleep apnea)    on cpap   PAF (paroxysmal atrial fibrillation) ([place])    S/P colonoscopy 2009   3-4 mm transverse colon erosions likely secondary to  ASA   S/P endoscopy 10/2010   moderate erosive gastritis, Barrett's esophagus 1-2cm   Sleep apnea    SOB (shortness of breath) 11/03/2007   2D Echo EF 50%-55%   Stroke [facility])    Past Surgical History:  Procedure Laterality Date   BIOPSY N/A 04/29/2015   Procedure: BIOPSY;  Surgeon: Danie Binder, MD;  Location: [facility] ORS;  Service: Endoscopy;  Laterality: N/A;   BIOPSY  08/01/2018   Procedure: BIOPSY;  Surgeon: Danie Binder, MD;  Location: [facility] ENDO SUITE;  Service: Endoscopy;;  esophageal   CABG X 4  03/2008   CARDIAC CATHETERIZATION  2009   stent placement to the left circumflex a 2.25    CARDIAC CATHETERIZATION  07/08/2010   CARDIAC CATHETERIZATION N/A 11/02/2019   COLONOSCOPY WITH PROPOFOL N/A 09/27/2017   normal ileum,  twenty 4 to 8 mm polyps in the sigmoid colon, descending colon, splenic flexure, transverse colon, ascending colon, cecum.  An additional three 2 to 4 mm polyps in the rectum and the descending colon.  Diverticulosis, internal hemorrhoids.  Surgical pathology found the polyps to be one fragment of hyperplastic polyp and 22 fragments of tubular adenoma..  Recommended repeat colonoscop   CORONARY ARTERY BYPASS GRAFT  2008   4 vessels   CORONARY STENT PLACEMENT  12/29/12   CORONARY STENT PLACEMENT  12/2012   ESOPHAGEAL DILATION N/A 04/29/2015   Procedure: ESOPHAGEAL DILATION 15 mm, 16 mm;  Surgeon: Danie Binder, MD;  Location: [facility];  Service: Endoscopy;  Laterality: N/A;   ESOPHAGOGASTRODUODENOSCOPY  09/2011   Barrett's esophagus, no dysplasia on biopsies. Distal esophagitis. Status post dilation. Moderate gastritis and duodenitis, but biopsies benign. Next EGD in November 2015 for surveillance of Barrett's esophagus.   ESOPHAGOGASTRODUODENOSCOPY (EGD) WITH PROPOFOL N/A 04/29/2015   SLF: 1. Barretts esophagus 2. Moderate non-erosive gastritis.    ESOPHAGOGASTRODUODENOSCOPY (EGD) WITH PROPOFOL N/A 08/01/2018   Barrett's, repeat in 5 years. Empiric dilatation, mild gastritis   HERNIA REPAIR     ventral hernia repair   LEFT HEART CATHETERIZATION WITH CORONARY/GRAFT ANGIOGRAM N/A 12/29/2012   Procedure: LEFT HEART CATHETERIZATION WITH Beatrix Fetters;  Surgeon: Troy Sine, MD;  Location: [facility]  CATH LAB;  Service: Cardiovascular;  Laterality: N/A;   ORIF FIBULA FRACTURE Right 06/03/2015   Procedure: OPEN REDUCTION INTERNAL FIXATION (ORIF) DISTAL FIBULA  FRACTURE;  Surgeon: Garald Balding, MD;  Location: [place];  Service: Orthopedics;  Laterality: Right;   PERCUTANEOUS CORONARY STENT INTERVENTION (PCI-S)  12/29/2012   Procedure: PERCUTANEOUS CORONARY STENT INTERVENTION (PCI-S);  Surgeon: Troy Sine, MD;  Location: [facility] CATH LAB;  Service: Cardiovascular;;   POLYPECTOMY  09/27/2017   Procedure:  POLYPECTOMY;  Surgeon: Danie Binder, MD;  [facility];  Service: Endoscopy;;  cecal polyp, ascending polyps x6, transverse colon polyps x6, splenic flexure polyps x2, descending colon polyps x6, sigmoid  colon polyp x1, rectal polyp x1    SAVORY DILATION  09/06/2011   Procedure: SAVORY DILATION;  Surgeon: Dorothyann Peng, MD;  Location: [facility];  Service: Endoscopy;  Laterality: N/A;  Dilated with 66m   SAVORY DILATION N/A 08/01/2018   Procedure: SAVORY DILATION;  Surgeon: FDanie Binder MD;  Location[facility];  Service: Endoscopy;  Laterality: N/A;   Social History   Socioeconomic History   Marital status: Divorced    Spouse name: Not on file   Number of children: Not on file   Years of education: Not on file   Highest education level: Not on file  Occupational History   Not on file  Tobacco Use   Smoking status: Every Day    Packs/day: 0.50    Years: 30.00    Pack years: 15.00    Types: Cigarettes   Smokeless tobacco: Never   Tobacco comments:    smokes 1/2 pack per day 09/15/21  Vaping Use   Vaping Use: Never used  Substance and Sexual Activity   Alcohol use: Yes    Comment: occ beer   Drug use: No   Sexual activity: Never  Other Topics Concern   Not on file  Social History Narrative   Not on file   Social Determinants of Health   Financial Resource Strain: Not on file  Food Insecurity: Not on file  Transportation Needs: Not on file  Physical Activity: Not on file  Stress: Not on file  Social Connections: Not on file   Outpatient Encounter Medications as of 10/06/2021  Medication Sig   Dulaglutide 1.5 MG/0.5ML SOPN Inject 1.5 mg into the skin once a week.   albuterol (VENTOLIN HFA) 108 (90 Base) MCG/ACT inhaler INHALE TWO PUFFS into THE lungs EVERY 6 HOURS AS NEEDED FOR SHORTNESS OF BREATH OR wheezing   amLODipine (NORVASC) 10 MG tablet Take 10 mg by mouth in the morning.   apixaban (ELIQUIS) 5 MG TABS tablet Take 1 tablet (5 mg total) by  mouth 2 (two) times daily.   blood glucose meter kit and supplies Dispense based on patient and insurance preference. Use up to two times daily as directed. (FOR ICD-E11.65)   Blood Glucose Monitoring Suppl (ONETOUCH VERIO) w/Device KIT 1 each by Does not apply route as needed.   Continuous Blood Gluc Receiver (FREESTYLE LIBRE 2 READER) DEVI As directed   Continuous Blood Gluc Sensor (FREESTYLE LIBRE 2 SENSOR) MISC 1 Piece by Does not apply route every 14 (fourteen) days.   Cyanocobalamin (VITAMIN B-12 IJ) Inject 1,000 mcg as directed every 30 (thirty) days.   diazepam (VALIUM) 5 MG tablet Take 5 mg by mouth 2 (two) times daily.   escitalopram (LEXAPRO) 20 MG tablet Take 20 mg by mouth in the morning.   fenofibrate (TRICOR) 145  MG tablet Take 145 mg by mouth in the morning.   Fluticasone-Umeclidin-Vilant 100-62.5-25 MCG/INH AEPB Inhale 1 puff into the lungs daily. Trelegy   furosemide (LASIX) 80 MG tablet Take 80 mg by mouth in the morning.   glipiZIDE (GLUCOTROL XL) 5 MG 24 hr tablet Take 1 tablet (5 mg total) by mouth daily with breakfast.   insulin regular human CONCENTRATED (HUMULIN R U-500 KWIKPEN) 500 UNIT/ML kwikpen Inject 70 Units into the skin 3 (three) times daily with meals. Only when blood glucose is above 90 and eating.   isosorbide mononitrate (IMDUR) 120 MG 24 hr tablet Take 120 mg by mouth in the morning.   Lancets (ONETOUCH DELICA PLUS [id number]) MISC USE TO check blood sugar FOUR TIMES DAILY AS DIRECTED   lisinopril (ZESTRIL) 10 MG tablet Take 1 tablet (10 mg total) by mouth daily.   meclizine (ANTIVERT) 25 MG tablet Take 25 mg by mouth every 6 (six) hours as needed for dizziness or nausea.   metoprolol tartrate (LOPRESSOR) 100 MG tablet Take 1 tablet (100 mg total) by mouth 2 (two) times daily. OFFICE VISIT NEEDED BEFORE ADDITIONAL REFILLS   modafinil (PROVIGIL) 200 MG tablet Take 400 mg by mouth in the morning.   nitroGLYCERIN (NITROLINGUAL) 0.4 MG/SPRAY spray Place 1 spray  under the tongue every 5 (five) minutes x 3 doses as needed for chest pain.   ONETOUCH VERIO test strip USE TO check blood sugar FOUR TIMES DAILY AS DIRECTED   pantoprazole (PROTONIX) 40 MG tablet Take 1 tablet (40 mg total) by mouth 2 (two) times daily before a meal.   polyethylene glycol-electrolytes (TRILYTE) 420 g solution Take 4,000 mLs by mouth as directed.   potassium chloride SA (K-DUR,KLOR-CON) 20 MEQ tablet Take 1 tablet (20 mEq total) by mouth 2 (two) times daily.   rosuvastatin (CRESTOR) 20 MG tablet Take 20 mg by mouth in the morning.   TRUEPLUS PEN NEEDLES 31G X 8 MM MISC USE AS DIRECTED TWICE DAILY   Vitamin D, Ergocalciferol, (DRISDOL) 1.25 MG (50000 UNIT) CAPS capsule Take 1 capsule (50,000 Units total) by mouth every 7 (seven) days. (Patient taking differently: Take 50,000 Units by mouth every Friday.)   [DISCONTINUED] Dulaglutide (TRULICITY) 1.5 YW/7.3XT SOPN Inject 1.5 mg into the skin once a week. (Patient taking differently: Inject 1.5 mg into the skin every Friday.)   No facility-administered encounter medications on file as of 10/06/2021.    ALLERGIES: Allergies  Allergen Reactions   Contrast Media [Iodinated Diagnostic Agents] Other (See Comments)    Pt must be premedicated before given contrast media - stops heart   Iohexol Other (See Comments)     Consult with radiologist before pre meds are given.Desc: PT. STATES HEART STOPPED HAS TO BE PREMED.     VACCINATION STATUS: Immunization History  Administered Date(s) Administered   Influenza,inj,Quad PF,6+ Mos 09/22/2020    Diabetes He presents for his follow-up diabetic visit. He has type 2 diabetes mellitus. Onset time: He was diagnosed at approximate age of 63 years. His disease course has been improving. There are no hypoglycemic associated symptoms. Pertinent negatives for hypoglycemia include no confusion, headaches, pallor or seizures. Pertinent negatives for diabetes include no blurred vision, no chest pain,  no fatigue, no polydipsia, no polyphagia, no polyuria and no weakness. There are no hypoglycemic complications. Symptoms are improving. Diabetic complications include a CVA and heart disease. Risk factors for coronary artery disease include diabetes mellitus, dyslipidemia, hypertension, male sex, obesity, tobacco exposure, sedentary lifestyle and family history. Current  diabetic treatments: He was supposed to be on basal insulin, unfortunately he ran out and could not afford refills and he did not call clinic for alternatives. He is compliant with treatment none of the time. His weight is fluctuating minimally. He is following a generally unhealthy diet. When asked about meal planning, he reported none. He has not had a previous visit with a dietitian. He never participates in exercise. His home blood glucose trend is decreasing steadily. His breakfast blood glucose range is generally 140-180 mg/dl. His lunch blood glucose range is generally 140-180 mg/dl. His dinner blood glucose range is generally 140-180 mg/dl. His bedtime blood glucose range is generally 140-180 mg/dl. His overall blood glucose range is 140-180 mg/dl. (He presents with his CGM, AGP report shows 61% within range, 30% above range.  He did not have significant hypoglycemia.  His point-of-care A1c is 7.5%, overall improving from 11.1%     ) An ACE inhibitor/angiotensin II receptor blocker is being taken. He does not see a podiatrist.Eye exam is not current.  Hyperlipidemia This is a chronic problem. The current episode started more than 1 year ago. The problem is uncontrolled. Recent lipid tests were reviewed and are high. Exacerbating diseases include diabetes and obesity. Pertinent negatives include no chest pain, myalgias or shortness of breath. Current antihyperlipidemic treatment includes statins. Compliance problems include medication cost and psychosocial issues.  Risk factors for coronary artery disease include dyslipidemia, diabetes  mellitus, hypertension, male sex, obesity, family history and a sedentary lifestyle.  Hypertension This is a chronic problem. The current episode started more than 1 year ago. The problem is uncontrolled. Pertinent negatives include no blurred vision, chest pain, headaches, neck pain, palpitations or shortness of breath. Risk factors for coronary artery disease include diabetes mellitus, dyslipidemia, male gender, obesity, family history, sedentary lifestyle and smoking/tobacco exposure. Past treatments include ACE inhibitors. Hypertensive end-organ damage includes CVA.    Review of systems  Constitutional: + Minimally fluctuating body weight,  current  Body mass index is 46.6 kg/m. , no fatigue, no subjective hyperthermia, no subjective hypothermia    Objective:    BP 96/68   Pulse 80   Ht _0  (1.778 m)   Wt (!) 324 lb 12.8 oz (147.3 kg)   BMI 46.60 kg/m   Wt Readings from Last 3 Encounters:  10/06/21 (!) 324 lb 12.8 oz (147.3 kg)  09/17/21 (!) 322 lb 12.8 oz (146.4 kg)  09/15/21 (!) 320 lb 12.8 oz (145.5 kg)      Physical Exam- Limited  Constitutional:  Body mass index is 46.6 kg/m. , not in acute distress, normal state of mind    CMP ( most recent) CMP     Component Value Date/Time   NA 140 09/23/2021 1412   K 4.3 09/23/2021 1412   CL 101 09/23/2021 1412   CO2 21 09/23/2021 1412   GLUCOSE 153 (H) 09/23/2021 1412   GLUCOSE 209 (H) 01/30/2019 1320   BUN 14 09/23/2021 1412   CREATININE 1.28 (H) 09/23/2021 1412   CALCIUM 9.9 09/23/2021 1412   PROT 6.9 09/23/2021 1412   ALBUMIN 4.1 09/23/2021 1412   AST 25 09/23/2021 1412   ALT 27 09/23/2021 1412   ALKPHOS 71 09/23/2021 1412   BILITOT 0.4 09/23/2021 1412   GFRNONAA 80 12/04/2020 0000   GFRAA 93 12/04/2020 0000   Diabetic Labs (most recent): Lab Results  Component Value Date   HGBA1C 7.5 (A) 10/06/2021   HGBA1C 6.3 06/09/2021   HGBA1C 7.8 (A) 02/02/2021  Lipid Panel ( most recent) Lipid Panel      Component Value Date/Time   CHOL 149 12/04/2020 0000   CHOL 132 07/03/2019 1324   TRIG 230 (A) 12/04/2020 0000   HDL 30 (A) 12/04/2020 0000   HDL 30 (L) 07/03/2019 1324   CHOLHDL 6.1 04/28/2014 0121   VLDL 61 (H) 04/28/2014 0121   LDLCALC 80 12/04/2020 0000   LDLCALC 53 07/03/2019 1324     Assessment & Plan:   1. DM type 2 causing vascular disease ([place])  - Patient has currently uncontrolled symptomatic type 2 DM since  60 years of age.  He presents with his CGM, AGP report shows 61% within range, 30% above range.  He did not have significant hypoglycemia.  His point-of-care A1c is 7.5%, overall improving from 11.1% .  -his diabetes is complicated by coronary artery disease, CVA, obesity/sedentary life, chronic heavy smoking and ANGELDEJESUS CALLAHAM remains at extremely high risk  for more acute and chronic complications which include CAD, CVA, CKD, retinopathy, and neuropathy. These are all discussed in detail with the patient.  - I have counseled him on diet management and weight loss, by adopting a carbohydrate restricted/protein rich diet.  - he acknowledges that there is a room for improvement in his food and drink choices. - Suggestion is made for him to avoid simple carbohydrates  from his diet including Cakes, Sweet Desserts, Ice Cream, Soda (diet and regular), Sweet Tea, Candies, Chips, Cookies, Store Bought Juices, Alcohol in Excess of  1-2 drinks a day, Artificial Sweeteners,  Coffee Creamer, and "Sugar-free" Products, Lemonade. This will help patient to have more stable blood glucose profile and potentially avoid unintended weight gain.   - I encouraged him to switch to  unprocessed or minimally processed complex starch and increased protein intake (animal or plant source), fruits, and vegetables.  - he is advised to stick to a routine mealtimes to eat 3 meals  a day and avoid unnecessary snacks ( to snack only to correct hypoglycemia).   - I have approached him with the  following individualized plan to manage diabetes and patient agrees:   - He continues to respond and benefit from  the U500  insulin favorably.   -Priority is still to avoid hypoglycemia in this patient.    -He is advised to continue U500 70 units 3 times a day for P[place] blood glucose readings above 90 mg per DL,  associated with strict monitoring of blood glucose 4 times a day-before meals and at bedtime. -He is benefiting from the CGM device.  He is advised to continue to wear it at all times.   He is not utilizing his CGM adequately, he is advised to utilize his CGM to monitor blood glucose at least 4 times a day-before meals and at bedtime.  -He has benefited from glipizide treatment.  He is advised to continue glipizide 5 mg XL p.o. daily at breakfast.   - He does not tolerate metformin due to GI side effects.  -He has benefited from low-dose Trulicity.  His Trulicity was increased to 1.5 mg subcutaneously weekly in the interim.  He is advised to continue.   -Patient is encouraged to call clinic for blood glucose levels less than 70 or above 200 mg /dl.   - Patient specific target  A1c;  LDL, HDL, Triglycerides,  were discussed in detail.  2) BP/HTN:  -Blood pressure is controlled to target. The patient was counseled on the dangers of tobacco use, and  was advised to quit.  Reviewed strategies to maximize success, including removing cigarettes and smoking materials from environment.  He is currently on lisinopril 10 mg p.o. daily.    3) Lipids/HPL: He has had history of severe hypertriglyceridemia, recently improving to 230 from 469, mainly associated with improvement in his glycemic profile.  He is advised to continue Trilipix 135 mg p.o. nightly, and start 20 mg p.o. nightly,  side effects and precautions discussed with him.      4)  Weight/Diet: His BMI is 35.3  -clearly complicating his diabetes care.  He is a candidate for modest weight loss.  CDE Consult has been  initiated ,  exercise, and detailed carbohydrates information provided.  5) vitamin D deficiency: He is on ongoing supplement with vitamin D2 50,000 units weekly for 12 weeks.  6) Chronic Care/Health Maintenance:  -he  is on ACEI/ARB and Statin medications and  is encouraged to continue to follow up with Ophthalmology, Dentist,  Podiatrist at least yearly or according to recommendations, and advised to  quit smoking. I have recommended yearly flu vaccine and pneumonia vaccination at least every 5 years; moderate intensity exercise for up to 150 minutes weekly; and  sleep for at least 7 hours a day.  The patient was counseled on the dangers of tobacco use, and was advised to quit.  Reviewed strategies to maximize success, including removing cigarettes and smoking materials from environment.    - I advised patient to maintain close follow up with Sharilyn Sites, MD for primary care needs.    I spent 41 minutes in the care of the patient today including review of labs from [place], Lipids, Thyroid Function, Hematology (current and previous including abstractions from other facilities); face-to-face time discussing  his blood glucose readings/logs, discussing hypoglycemia and hyperglycemia episodes and symptoms, medications doses, his options of short and long term treatment based on the latest standards of care / guidelines;  discussion about incorporating lifestyle medicine;  and documenting the encounter.    Please refer to Patient Instructions for Blood Glucose Monitoring and Insulin/Medications Dosing Guide"  in media tab for additional information. Please  also refer to " Patient Self Inventory" in the Media  tab for reviewed elements of pertinent patient history.  [name] participated in the discussions, expressed understanding, and voiced agreement with the above plans.  All questions were answered to his satisfaction. he is encouraged to contact clinic should he have any questions or concerns prior  to his return visit.   Follow up plan: - Return in about 3 months (around 01/04/2022) for Bring Meter and Logs- A1c in Office.   Glade Lloyd, MD Phone: [phone]  Fax: [phone]   10/06/2021, 6:59 PM   This note was partially dictated with voice recognition software. Similar sounding words can be transcribed inadequately or may not  be corrected upon review.

## 2021-10-07 NOTE — Progress Notes (Signed)
error 

## 2021-10-07 NOTE — Telephone Encounter (Signed)
Phoned to [facility] in [place] and spoke with [place]. Advised I had faxed again cardiac clearance needing specific level of risk per Roseanne Kaufman, NP. She stated she did not receive it (I have confirmation). Advised her that the pt has already been scheduled and his pre-op is tomorrow. She advises she will reprint and give it to the pt's Dr to get him to specify the risk for pt and fax it back to [place].

## 2021-10-07 NOTE — Telephone Encounter (Signed)
Advised dena to place this on [name]'s desk for review.

## 2021-10-07 NOTE — Telephone Encounter (Signed)
Documentation from Dr. Shirlee More stating pt is a low risk for having a colonoscopy. Will send to scan.

## 2021-10-07 NOTE — Patient Instructions (Signed)
[name]  10/07/2021     @PREFPERIOPPHARMACY @   Your procedure is scheduled on  10/12/2021.   Report to [name] at  1130 A.M.   Call this number if you have problems the morning of surgery:  [phone]   Remember:  Follow the diet and prep instructions given to you by the office.     Your last dose of eliquis should be on 10/09/2021.    Use your inhaler before you come and bring your rescue inhaler with you.    DO NOT take any medications for diabetes before your procedure.    Bring extra supplies with you for your glucose sensor in case it becomes dislodged during your procedure.    Take these medicines the morning of surgery with A SIP OF WATER      amlodipine, valium(if needed), lexapro, antivert(if needed), metoprolol, protonix.     Do not wear jewelry, make-up or nail polish.  Do not wear lotions, powders, or perfumes, or deodorant.  Do not shave 48 hours prior to surgery.  Men may shave face and neck.  Do not bring valuables to the hospital.  [facility] is not responsible for any belongings or valuables.  Contacts, dentures or bridgework may not be worn into surgery.  Leave your suitcase in the car.  After surgery it may be brought to your room.  For patients admitted to the hospital, discharge time will be determined by your treatment team.  Patients discharged the day of surgery will not be allowed to drive home and must have someone with them for 24 hours.    Special instructions:   DO NOT smoke tobacco or vape for 24 hours before your procedure.  Please read over the following fact sheets that you were given. Anesthesia Post-op Instructions and Care and Recovery After Surgery      Colonoscopy, Adult, Care After This sheet gives you information about how to care for yourself after your procedure. Your health care provider may also give you more specific instructions. If you have problems or questions, contact your health care  provider. What can I expect after the procedure? After the procedure, it is common to have: A small amount of blood in your stool for 24 hours after the procedure. Some gas. Mild cramping or bloating of your abdomen. Follow these instructions at home: Eating and drinking  Drink enough fluid to keep your urine pale yellow. Follow instructions from your health care provider about eating or drinking restrictions. Resume your normal diet as instructed by your health care provider. Avoid heavy or fried foods that are hard to digest. Activity Rest as told by your health care provider. Avoid sitting for a long time without moving. Get up to take short walks every 1-2 hours. This is important to improve blood flow and breathing. Ask for help if you feel weak or unsteady. Return to your normal activities as told by your health care provider. Ask your health care provider what activities are safe for you. Managing cramping and bloating  Try walking around when you have cramps or feel bloated. Apply heat to your abdomen as told by your health care provider. Use the heat source that your health care provider recommends, such as a moist heat pack or a heating pad. Place a towel between your skin and the heat source. Leave the heat on for 20-30 minutes. Remove the heat if your skin turns bright red. This is especially important if  you are unable to feel pain, heat, or cold. You may have a greater risk of getting burned. General instructions If you were given a sedative during the procedure, it can affect you for several hours. Do not drive or operate machinery until your health care provider says that it is safe. For the first 24 hours after the procedure: Do not sign important documents. Do not drink alcohol. Do your regular daily activities at a slower pace than normal. Eat soft foods that are easy to digest. Take over-the-counter and prescription medicines only as told by your health care  provider. Keep all follow-up visits as told by your health care provider. This is important. Contact a health care provider if: You have blood in your stool 2-3 days after the procedure. Get help right away if you have: More than a small spotting of blood in your stool. Large blood clots in your stool. Swelling of your abdomen. Nausea or vomiting. A fever. Increasing pain in your abdomen that is not relieved with medicine. Summary After the procedure, it is common to have a small amount of blood in your stool. You may also have mild cramping and bloating of your abdomen. If you were given a sedative during the procedure, it can affect you for several hours. Do not drive or operate machinery until your health care provider says that it is safe. Get help right away if you have a lot of blood in your stool, nausea or vomiting, a fever, or increased pain in your abdomen. This information is not intended to replace advice given to you by your health care provider. Make sure you discuss any questions you have with your health care provider. Document Revised: 08/24/2019 Document Reviewed: 05/14/2019 Elsevier Patient Education  [place] After This sheet gives you information about how to care for yourself after your procedure. Your health care provider may also give you more specific instructions. If you have problems or questions, contact your health care provider. What can I expect after the procedure? After the procedure, it is common to have: Tiredness. Forgetfulness about what happened after the procedure. Impaired judgment for important decisions. Nausea or vomiting. Some difficulty with balance. Follow these instructions at home: For the time period you were told by your health care provider:   Rest as needed. Do not participate in activities where you could fall or become injured. Do not drive or use machinery. Do not drink alcohol. Do not  take sleeping pills or medicines that cause drowsiness. Do not make important decisions or sign legal documents. Do not take care of children on your own. Eating and drinking Follow the diet that is recommended by your health care provider. Drink enough fluid to keep your urine pale yellow. If you vomit: Drink water, juice, or soup when you can drink without vomiting. Make sure you have little or no nausea before eating solid foods. General instructions Have a responsible adult stay with you for the time you are told. It is important to have someone help care for you until you are awake and alert. Take over-the-counter and prescription medicines only as told by your health care provider. If you have sleep apnea, surgery and certain medicines can increase your risk for breathing problems. Follow instructions from your health care provider about wearing your sleep device: Anytime you are sleeping, including during daytime naps. While taking prescription pain medicines, sleeping medicines, or medicines that make you drowsy. Avoid smoking. Keep all follow-up visits  as told by your health care provider. This is important. Contact a health care provider if: You keep feeling nauseous or you keep vomiting. You feel light-headed. You are still sleepy or having trouble with balance after 24 hours. You develop a rash. You have a fever. You have redness or swelling around the IV site. Get help right away if: You have trouble breathing. You have new-onset confusion at home. Summary For several hours after your procedure, you may feel tired. You may also be forgetful and have poor judgment. Have a responsible adult stay with you for the time you are told. It is important to have someone help care for you until you are awake and alert. Rest as told. Do not drive or operate machinery. Do not drink alcohol or take sleeping pills. Get help right away if you have trouble breathing, or if you suddenly  become confused. This information is not intended to replace advice given to you by your health care provider. Make sure you discuss any questions you have with your health care provider. Document Revised: 07/03/2020 Document Reviewed: 09/20/2019 Elsevier Patient Education  2022 [employer].

## 2021-10-08 ENCOUNTER — Other Ambulatory Visit: Payer: Self-pay

## 2021-10-08 ENCOUNTER — Encounter (HOSPITAL_COMMUNITY)
Admission: RE | Admit: 2021-10-08 | Discharge: 2021-10-08 | Disposition: A | Discharge status: Home / Self Care | Payer: Medicare HMO | Source: Ambulatory Visit | Attending: Internal Medicine | Admitting: Internal Medicine

## 2021-10-08 ENCOUNTER — Ambulatory Visit: Payer: Medicare HMO | Admitting: Pulmonary Disease

## 2021-10-08 ENCOUNTER — Encounter: Payer: Self-pay | Admitting: Pulmonary Disease

## 2021-10-08 ENCOUNTER — Encounter (HOSPITAL_COMMUNITY): Payer: Self-pay

## 2021-10-08 VITALS — BP 136/82 | HR 90 | Temp 98.2°F | Ht 70.0 in | Wt 325.1 lb

## 2021-10-08 DIAGNOSIS — G4733 Obstructive sleep apnea (adult) (pediatric): Secondary | ICD-10-CM | POA: Diagnosis not present

## 2021-10-08 DIAGNOSIS — J449 Chronic obstructive pulmonary disease, unspecified: Secondary | ICD-10-CM | POA: Diagnosis not present

## 2021-10-08 DIAGNOSIS — Z01810 Encounter for preprocedural cardiovascular examination: Secondary | ICD-10-CM | POA: Insufficient documentation

## 2021-10-08 DIAGNOSIS — C3411 Malignant neoplasm of upper lobe, right bronchus or lung: Secondary | ICD-10-CM | POA: Diagnosis not present

## 2021-10-08 DIAGNOSIS — R69 Illness, unspecified: Secondary | ICD-10-CM | POA: Diagnosis not present

## 2021-10-08 DIAGNOSIS — J432 Centrilobular emphysema: Secondary | ICD-10-CM

## 2021-10-08 DIAGNOSIS — F17200 Nicotine dependence, unspecified, uncomplicated: Secondary | ICD-10-CM

## 2021-10-08 MED ORDER — ALBUTEROL SULFATE (2.5 MG/3ML) 0.083% IN NEBU: 2.5000 mg | INHALATION_SOLUTION | Freq: Four times a day (QID) | RESPIRATORY_TRACT | 12 refills | Status: DC | PRN

## 2021-10-08 NOTE — Patient Instructions (Signed)
CT chest Wo contrast in 1 year to follow up on lung cancer  X Rx for nebuliser & albuterol nebs x 30  You have to QUIT smoking !!

## 2021-10-08 NOTE — Assessment & Plan Note (Signed)
Smoking cessation was again emphasized is the most important intervention that would add years to his life.  He is not willing to make a quit attempt

## 2021-10-08 NOTE — Addendum Note (Signed)
Addended by: Fritzi Mandes D on: 10/08/2021 12:32 PM   Modules accepted: Orders

## 2021-10-08 NOTE — Assessment & Plan Note (Signed)
He received SBRT in June and follow-up CT chest in November shows decrease in size of nodule to 7 mm.  This is a good response. Other nodules are stable. We will continue surveillance CT in 12 months

## 2021-10-08 NOTE — Assessment & Plan Note (Signed)
CPAP download will be reviewed.  He is very compliant by history he has oxygen blended into CPAP He still has persistent hypersomnolence for which she uses modafinil daily  Weight loss encouraged, compliance with goal of at least 4-6 hrs every night is the expectation. Advised against medications with sedative side effects Cautioned against driving when sleepy - understanding that sleepiness will vary on a day to day basis

## 2021-10-08 NOTE — Assessment & Plan Note (Signed)
We will provide him a prescription for a new albuterol nebulizer and albuterol nebs.  Hopefully this will be enough to help him get over this exacerbation.  He will continue on Trelegy. I am hesitant to give him steroids due to his insulin requiring diabetes and fear of worsening hyperglycemia but he will call should his breathing worsen

## 2021-10-08 NOTE — Progress Notes (Signed)
   Subjective:    Patient ID: [name], male    DOB: 10/07/1961, 60 y.o.   MRN: [id number]  HPI  60 yo obese smoker  for FU of  COPD and OSA and lung nodules. S/p SBRT 04/2021 for enlarging right upper lobe nodule/lung cancer OSA -on CPAP 8 cm + 3L O2   PMH -CABG 2008 follows with [facility] cardiology,  diabetes, insulin requiring , PAF on eliquis Cor angio 03/2020  2/4 grafts patent, LIMA to LAD and SVG to Ramus. Occluded SVG to RCA, occluded SVG to OM. Both of these are old findings nml LVEF    Cardiolite SPECT study 01/2020 revealed large perfusion abnormality in the inferior/lateral region.   He smoked more than 40 pack years, continues about a pack per day Lung nodules were initially noted on a CT scan in 2017 ,  Chief Complaint  Patient presents with   Follow-up    Using O2 and cpap machine. Feels machine is working okay.    He underwent SBRT in June and has done well since then.  We reviewed follow-up CT chest from 09/2021  He reports compliance with his CPAP machine and denies any problems with mask or pressure however he states that he still sleepy in the daytime.  He remains on modafinil 400 mg.  He reports 1 instance where he slept from Friday night straight into Sunday.  He complains of increased shortness of breath with intermittent wheezing.  He has misplaced his nebulizer and does not have albuterol nebs.  He is compliant with his Lasix, weight is unchanged.  He continues to smoke about a pack per day    Significant tests/ events reviewed  CT chest without con 09/2021 -decreased size of right upper lobe nodule to 7 mm other nodules are stable.  CT chest wo con 03/2021 >> subsolid 1.5 x 1.5 cm nodule RUL increasing in size & density , other nodules stable   PFTs 06/2020 moderate airway obstruction with ratio of 70, FEV1 1.90/51%, FVC 2.70/55%, no bronchodilator response, TLC 76%, DLCO 16.5/58%   PET 09/2020 >> SUV 2.1   CT chest 06/2020 RUL GG 12 x 10 mmnodule      CT chest 12/2018 advanced changes of emphysema , multiple noncalcified nodules unchanged compared to 09/2018, largest right upper lobe 9 mm slight increased compared to 12/2016   NPSG 11/2007 mild OSA 5.7/hour 07/2015 NPSG AHI 16/h   06/2016 CPAP titration-2 9 7  pounds, no O2 required CPAP 8cm  Review of Systems neg for any significant sore throat, dysphagia, itching, sneezing, nasal congestion or excess/ purulent secretions, fever, chills, sweats, unintended wt loss, pleuritic or exertional cp, hempoptysis, orthopnea pnd or change in chronic leg swelling. Also denies presyncope, palpitations, heartburn, abdominal pain, nausea, vomiting, diarrhea or change in bowel or urinary habits, dysuria,hematuria, rash, arthralgias, visual complaints, headache, numbness weakness or ataxia.     Objective:   Physical Exam  Gen. Pleasant, obese, in no distress ENT - no lesions, no post nasal drip Neck: No JVD, no thyromegaly, no carotid bruits Lungs: no use of accessory muscles, no dullness to percussion, decreased without rales , faint scattered rhonchi  Cardiovascular: Rhythm regular, heart sounds  normal, no murmurs or gallops, no peripheral edema Musculoskeletal: No deformities, no cyanosis or clubbing , no tremors       Assessment & Plan:

## 2021-10-12 ENCOUNTER — Ambulatory Visit: Payer: Medicare HMO | Admitting: "Endocrinology

## 2021-10-12 ENCOUNTER — Encounter (HOSPITAL_COMMUNITY)
Admission: RE | Disposition: A | Discharge status: Home / Self Care | Payer: Self-pay | Source: Home / Self Care | Attending: Internal Medicine

## 2021-10-12 ENCOUNTER — Ambulatory Visit (HOSPITAL_COMMUNITY): Payer: Medicare HMO | Admitting: Anesthesiology

## 2021-10-12 ENCOUNTER — Ambulatory Visit (HOSPITAL_COMMUNITY)
Admission: RE | Admit: 2021-10-12 | Discharge: 2021-10-12 | Disposition: A | Discharge status: Home / Self Care | Payer: Medicare HMO | Attending: Internal Medicine | Admitting: Internal Medicine

## 2021-10-12 DIAGNOSIS — G4733 Obstructive sleep apnea (adult) (pediatric): Secondary | ICD-10-CM | POA: Insufficient documentation

## 2021-10-12 DIAGNOSIS — I129 Hypertensive chronic kidney disease with stage 1 through stage 4 chronic kidney disease, or unspecified chronic kidney disease: Secondary | ICD-10-CM | POA: Insufficient documentation

## 2021-10-12 DIAGNOSIS — I509 Heart failure, unspecified: Secondary | ICD-10-CM | POA: Diagnosis not present

## 2021-10-12 DIAGNOSIS — D123 Benign neoplasm of transverse colon: Secondary | ICD-10-CM | POA: Diagnosis not present

## 2021-10-12 DIAGNOSIS — Z1211 Encounter for screening for malignant neoplasm of colon: Secondary | ICD-10-CM | POA: Diagnosis not present

## 2021-10-12 DIAGNOSIS — F17210 Nicotine dependence, cigarettes, uncomplicated: Secondary | ICD-10-CM | POA: Diagnosis not present

## 2021-10-12 DIAGNOSIS — I252 Old myocardial infarction: Secondary | ICD-10-CM | POA: Diagnosis not present

## 2021-10-12 DIAGNOSIS — K635 Polyp of colon: Secondary | ICD-10-CM | POA: Insufficient documentation

## 2021-10-12 DIAGNOSIS — Z951 Presence of aortocoronary bypass graft: Secondary | ICD-10-CM | POA: Diagnosis not present

## 2021-10-12 DIAGNOSIS — I480 Paroxysmal atrial fibrillation: Secondary | ICD-10-CM | POA: Diagnosis not present

## 2021-10-12 DIAGNOSIS — K648 Other hemorrhoids: Secondary | ICD-10-CM | POA: Diagnosis not present

## 2021-10-12 DIAGNOSIS — N189 Chronic kidney disease, unspecified: Secondary | ICD-10-CM | POA: Insufficient documentation

## 2021-10-12 DIAGNOSIS — K219 Gastro-esophageal reflux disease without esophagitis: Secondary | ICD-10-CM | POA: Insufficient documentation

## 2021-10-12 DIAGNOSIS — Z85118 Personal history of other malignant neoplasm of bronchus and lung: Secondary | ICD-10-CM | POA: Diagnosis not present

## 2021-10-12 DIAGNOSIS — D124 Benign neoplasm of descending colon: Secondary | ICD-10-CM | POA: Diagnosis not present

## 2021-10-12 DIAGNOSIS — E1122 Type 2 diabetes mellitus with diabetic chronic kidney disease: Secondary | ICD-10-CM | POA: Insufficient documentation

## 2021-10-12 DIAGNOSIS — Z955 Presence of coronary angioplasty implant and graft: Secondary | ICD-10-CM | POA: Diagnosis not present

## 2021-10-12 DIAGNOSIS — E1136 Type 2 diabetes mellitus with diabetic cataract: Secondary | ICD-10-CM | POA: Diagnosis not present

## 2021-10-12 DIAGNOSIS — Z86010 Personal history of colonic polyps: Secondary | ICD-10-CM | POA: Diagnosis not present

## 2021-10-12 DIAGNOSIS — J449 Chronic obstructive pulmonary disease, unspecified: Secondary | ICD-10-CM | POA: Diagnosis not present

## 2021-10-12 DIAGNOSIS — F32A Depression, unspecified: Secondary | ICD-10-CM | POA: Diagnosis not present

## 2021-10-12 DIAGNOSIS — R69 Illness, unspecified: Secondary | ICD-10-CM | POA: Diagnosis not present

## 2021-10-12 DIAGNOSIS — Z8673 Personal history of transient ischemic attack (TIA), and cerebral infarction without residual deficits: Secondary | ICD-10-CM | POA: Insufficient documentation

## 2021-10-12 HISTORY — PX: COLONOSCOPY WITH PROPOFOL: SHX5780

## 2021-10-12 HISTORY — PX: POLYPECTOMY: SHX5525

## 2021-10-12 HISTORY — PX: BIOPSY: SHX5522

## 2021-10-12 LAB — GLUCOSE, CAPILLARY: Glucose-Capillary: 133 mg/dL — ABNORMAL HIGH (ref 70–99)

## 2021-10-12 SURGERY — COLONOSCOPY WITH PROPOFOL
Anesthesia: General

## 2021-10-12 MED ORDER — IPRATROPIUM-ALBUTEROL 0.5-2.5 (3) MG/3ML IN SOLN
RESPIRATORY_TRACT | Status: AC
  Filled 2021-10-12: qty 3

## 2021-10-12 MED ORDER — IPRATROPIUM-ALBUTEROL 0.5-2.5 (3) MG/3ML IN SOLN
3.0000 mL | Freq: Once | RESPIRATORY_TRACT | Status: AC
  Administered 2021-10-12: 3 mL via RESPIRATORY_TRACT

## 2021-10-12 MED ORDER — PROPOFOL 10 MG/ML IV BOLUS
INTRAVENOUS | Status: DC | PRN
  Administered 2021-10-12: 30 mg via INTRAVENOUS
  Administered 2021-10-12: 50 mg via INTRAVENOUS
  Administered 2021-10-12: 30 mg via INTRAVENOUS
  Administered 2021-10-12: 100 mg via INTRAVENOUS

## 2021-10-12 MED ORDER — LACTATED RINGERS IV SOLN: INTRAVENOUS | Status: DC

## 2021-10-12 MED ORDER — LIDOCAINE HCL (CARDIAC) PF 100 MG/5ML IV SOSY
PREFILLED_SYRINGE | INTRAVENOUS | Status: DC | PRN
  Administered 2021-10-12: 100 mg via INTRAVENOUS

## 2021-10-12 NOTE — Anesthesia Postprocedure Evaluation (Signed)
Anesthesia Post Note  Patient: [name]  Procedure(s) Performed: COLONOSCOPY WITH PROPOFOL BIOPSY POLYPECTOMY  Patient location during evaluation: Phase II Anesthesia Type: General Level of consciousness: awake and alert and oriented Pain management: pain level controlled Vital Signs Assessment: post-procedure vital signs reviewed and stable Respiratory status: spontaneous breathing, nonlabored ventilation and respiratory function stable Cardiovascular status: blood pressure returned to baseline and stable Postop Assessment: no apparent nausea or vomiting Anesthetic complications: no   No notable events documented.   Last Vitals:  Vitals:   10/12/21 1215 10/12/21 1315  BP:  135/83  Pulse: 83 94  Resp: 17 18  Temp:  (!) 34.4 C  SpO2: 97% 94%    Last Pain:  Vitals:   10/12/21 1315  TempSrc: Oral  PainSc: 0-No pain                 Campbell Kray C Aamiyah Derrick

## 2021-10-12 NOTE — Progress Notes (Signed)
Patient had question about resuming home medications. Instructed to resume all home meds except eliquis and he is to resume that tomorrow. Verbalized understanding

## 2021-10-12 NOTE — Discharge Instructions (Signed)
  Colonoscopy Discharge Instructions  Read the instructions outlined below and refer to this sheet in the next few weeks. These discharge instructions provide you with general information on caring for yourself after you leave the hospital. Your doctor may also give you specific instructions. While your treatment has been planned according to the most current medical practices available, unavoidable complications occasionally occur.   ACTIVITY You may resume your regular activity, but move at a slower pace for the next 24 hours.  Take frequent rest periods for the next 24 hours.  Walking will help get rid of the air and reduce the bloated feeling in your belly (abdomen).  No driving for 24 hours (because of the medicine (anesthesia) used during the test).   Do not sign any important legal documents or operate any machinery for 24 hours (because of the anesthesia used during the test).  NUTRITION Drink plenty of fluids.  You may resume your normal diet as instructed by your doctor.  Begin with a light meal and progress to your normal diet. Heavy or fried foods are harder to digest and may make you feel sick to your stomach (nauseated).  Avoid alcoholic beverages for 24 hours or as instructed.  MEDICATIONS You may resume your normal medications unless your doctor tells you otherwise.  WHAT YOU CAN EXPECT TODAY Some feelings of bloating in the abdomen.  Passage of more gas than usual.  Spotting of blood in your stool or on the toilet paper.  IF YOU HAD POLYPS REMOVED DURING THE COLONOSCOPY: No aspirin products for 7 days or as instructed.  No alcohol for 7 days or as instructed.  Eat a soft diet for the next 24 hours.  FINDING OUT THE RESULTS OF YOUR TEST Not all test results are available during your visit. If your test results are not back during the visit, make an appointment with your caregiver to find out the results. Do not assume everything is normal if you have not heard from your  caregiver or the medical facility. It is important for you to follow up on all of your test results.  SEEK IMMEDIATE MEDICAL ATTENTION IF: You have more than a spotting of blood in your stool.  Your belly is swollen (abdominal distention).  You are nauseated or vomiting.  You have a temperature over 101.  You have abdominal pain or discomfort that is severe or gets worse throughout the day.   Your colonoscopy revealed 4 polyp(s) which I removed successfully. Await pathology results, my office will contact you. I recommend repeating colonoscopy in 5 years for surveillance purposes. Otherwise follow up with Gi as needed   I hope you have a great rest of your week!  Elon Alas. Abbey Chatters, D.O. Gastroenterology and Hepatology [facility]

## 2021-10-12 NOTE — Interval H&P Note (Signed)
History and Physical Interval Note:  10/12/2021 10:53 AM  [name]  has presented today for surgery, with the diagnosis of history of polyps.  The various methods of treatment have been discussed with the patient and family. After consideration of risks, benefits and other options for treatment, the patient has consented to  Procedure(s) with comments: COLONOSCOPY WITH PROPOFOL (N/A) - 1:30pm as a surgical intervention.  The patient's history has been reviewed, patient examined, no change in status, stable for surgery.  I have reviewed the patient's chart and labs.  Questions were answered to the patient's satisfaction.     Eloise Harman

## 2021-10-12 NOTE — Anesthesia Preprocedure Evaluation (Addendum)
Anesthesia Evaluation  Patient identified by MRN, date of birth, ID band Patient awake    Reviewed: Allergy & Precautions, H&P , NPO status , Patient's Chart, lab work & pertinent test results  Airway Mallampati: III  TM Distance: >3 FB Neck ROM: Full    Dental  (+) Upper Dentures, Lower Dentures   Pulmonary sleep apnea, Continuous Positive Airway Pressure Ventilation and Oxygen sleep apnea , COPD, Current Smoker and Patient abstained from smoking.,     + wheezing      Cardiovascular hypertension, + angina + CAD, + Past MI, + Cardiac Stents, + CABG and +CHF  + dysrhythmias Atrial Fibrillation  Rhythm:Irregular Rate:Normal     Neuro/Psych PSYCHIATRIC DISORDERS Depression CVA    GI/Hepatic Neg liver ROS, GERD  Medicated and Controlled,  Endo/Other  diabetesMorbid obesity  Renal/GU Renal InsufficiencyRenal disease  negative genitourinary   Musculoskeletal negative musculoskeletal ROS (+)   Abdominal   Peds negative pediatric ROS (+)  Hematology negative hematology ROS (+)   Anesthesia Other Findings   Reproductive/Obstetrics negative OB ROS                           Anesthesia Physical Anesthesia Plan  ASA: 4  Anesthesia Plan: General   Post-op Pain Management: Minimal or no pain anticipated   Induction: Intravenous  PONV Risk Score and Plan: TIVA  Airway Management Planned: Nasal Cannula, Natural Airway and Simple Face Mask  Additional Equipment:   Intra-op Plan:   Post-operative Plan:   Informed Consent: I have reviewed the patients History and Physical, chart, labs and discussed the procedure including the risks, benefits and alternatives for the proposed anesthesia with the patient or authorized representative who has indicated his/her understanding and acceptance.     Dental advisory given  Plan Discussed with: Surgeon  Anesthesia Plan Comments: (Will give duoneb  treatment before the procedure.)       Anesthesia Quick Evaluation

## 2021-10-12 NOTE — Op Note (Signed)
[facility] Patient Name: [name] Procedure Date: 10/12/2021 12:29 PM MRN: [id number] Date of Birth: 1961/04/20 Attending MD: Elon Alas. Edgar Frisk CSN: [id number] Age: 60 Admit Type: Outpatient Procedure:                Colonoscopy Indications:              High risk colon cancer surveillance: Personal                            history of colonic polyps Providers:                Elon Alas. Abbey Chatters, DO, Lambert Mody, Caprice Kluver Referring MD:              Medicines:                See the Anesthesia note for documentation of the                            administered medications Complications:            No immediate complications. Estimated Blood Loss:     Estimated blood loss was minimal. Procedure:                Pre-Anesthesia Assessment:                           - The anesthesia plan was to use monitored                            anesthesia care (MAC).                           After obtaining informed consent, the colonoscope                            was passed under direct vision. Throughout the                            procedure, the patient's blood pressure, pulse, and                            oxygen saturations were monitored continuously. The                            PCF-HQ190L ([id number] scope was introduced through                            the anus and advanced to the the cecum, identified                            by appendiceal orifice and ileocecal valve. The                            colonoscopy was performed without difficulty. The                            patient tolerated the  procedure well. The quality                            of the bowel preparation was evaluated using the                            BBPS [facility] Bowel Preparation Scale) with scores                            of: Right Colon = 2 (minor amount of residual                            staining, small fragments of stool and/or opaque                             liquid, but mucosa seen well), Transverse Colon = 2                            (minor amount of residual staining, small fragments                            of stool and/or opaque liquid, but mucosa seen                            well) and Left Colon = 2 (minor amount of residual                            staining, small fragments of stool and/or opaque                            liquid, but mucosa seen well). The total BBPS score                            equals 6. The quality of the bowel preparation was                            fair. Scope In: 12:44:09 PM Scope Out: 1:06:38 PM Scope Withdrawal Time: 0 hours 20 minutes 32 seconds  Total Procedure Duration: 0 hours 22 minutes 29 seconds  Findings:      The perianal and digital rectal examinations were normal.      Non-bleeding internal hemorrhoids were found during endoscopy.      A 2 mm polyp was found in the transverse colon. The polyp was sessile.       The polyp was removed with a cold biopsy forceps. Resection and       retrieval were complete.      Three sessile polyps were found in the descending colon. The polyps were       4 to 6 mm in size. These polyps were removed with a cold snare.       Resection and retrieval were complete.      The exam was otherwise without abnormality. Impression:               - Preparation of the colon was fair.                           -  Non-bleeding internal hemorrhoids.                           - One 2 mm polyp in the transverse colon, removed                            with a cold biopsy forceps. Resected and retrieved.                           - Three 4 to 6 mm polyps in the descending colon,                            removed with a cold snare. Resected and retrieved.                           - The examination was otherwise normal. Moderate Sedation:      Per Anesthesia Care Recommendation:           - Patient has a contact number available for                             emergencies. The signs and symptoms of potential                            delayed complications were discussed with the                            patient. Return to normal activities tomorrow.                            Written discharge instructions were provided to the                            patient.                           - Resume previous diet.                           - Continue present medications.                           - Await pathology results.                           - Repeat colonoscopy in 5 years for surveillance.                           - Return to GI clinic PRN. Procedure Code(s):        --- Professional ---                           [phone], Colonoscopy, flexible; with removal of                            tumor(s), polyp(s), or  other lesion(s) by snare                            technique                           45380, 59, Colonoscopy, flexible; with biopsy,                            single or multiple Diagnosis Code(s):        --- Professional ---                           K63.5, Polyp of colon                           Z86.010, Personal history of colonic polyps                           K64.8, Other hemorrhoids CPT copyright 2019 [employer]. All rights reserved. The codes documented in this report are preliminary and upon coder review may  be revised to meet current compliance requirements. Elon Alas. Abbey Chatters, DO Casey Abbey Chatters, DO 10/12/2021 1:09:30 PM This report has been signed electronically. Number of Addenda: 0

## 2021-10-12 NOTE — Transfer of Care (Signed)
Immediate Anesthesia Transfer of Care Note  Patient: [name]  Procedure(s) Performed: COLONOSCOPY WITH PROPOFOL BIOPSY POLYPECTOMY  Patient Location: PACU  Anesthesia Type:General  Level of Consciousness: awake, alert  and oriented  Airway & Oxygen Therapy: Patient Spontanous Breathing  Post-op Assessment: Report given to RN and Post -op Vital signs reviewed and stable  Post vital signs: Reviewed and stable  Last Vitals:  Vitals Value Taken Time  BP 128/90 10/12/21 1315  Temp 37 C 10/12/21 1315  Pulse 95 10/12/21 1315  Resp 16 10/12/21 1315  SpO2 94 % 10/12/21 1315    Last Pain:  Vitals:   10/12/21 1315  TempSrc: Tympanic  PainSc:          Complications: No notable events documented.

## 2021-10-12 NOTE — Anesthesia Procedure Notes (Signed)
Date/Time: 10/12/2021 12:44 PM Performed by: Orlie Dakin, CRNA Pre-anesthesia Checklist: Patient identified, Emergency Drugs available, Suction available and Patient being monitored Patient Re-evaluated:Patient Re-evaluated prior to induction Oxygen Delivery Method: Non-rebreather mask Induction Type: IV induction Placement Confirmation: positive ETCO2

## 2021-10-13 LAB — SURGICAL PATHOLOGY

## 2021-10-14 ENCOUNTER — Encounter (HOSPITAL_COMMUNITY): Payer: Self-pay | Admitting: Internal Medicine

## 2021-10-19 DIAGNOSIS — E538 Deficiency of other specified B group vitamins: Secondary | ICD-10-CM | POA: Diagnosis not present

## 2021-10-19 DIAGNOSIS — E1159 Type 2 diabetes mellitus with other circulatory complications: Secondary | ICD-10-CM | POA: Diagnosis not present

## 2021-10-27 DIAGNOSIS — G4733 Obstructive sleep apnea (adult) (pediatric): Secondary | ICD-10-CM | POA: Diagnosis not present

## 2021-10-27 DIAGNOSIS — J449 Chronic obstructive pulmonary disease, unspecified: Secondary | ICD-10-CM | POA: Diagnosis not present

## 2021-10-30 DIAGNOSIS — I10 Essential (primary) hypertension: Secondary | ICD-10-CM | POA: Diagnosis not present

## 2021-10-30 DIAGNOSIS — E1165 Type 2 diabetes mellitus with hyperglycemia: Secondary | ICD-10-CM | POA: Diagnosis not present

## 2021-10-30 DIAGNOSIS — J449 Chronic obstructive pulmonary disease, unspecified: Secondary | ICD-10-CM | POA: Diagnosis not present

## 2021-11-14 DIAGNOSIS — E261 Secondary hyperaldosteronism: Secondary | ICD-10-CM | POA: Diagnosis not present

## 2021-11-14 DIAGNOSIS — E1151 Type 2 diabetes mellitus with diabetic peripheral angiopathy without gangrene: Secondary | ICD-10-CM | POA: Diagnosis not present

## 2021-11-14 DIAGNOSIS — F17210 Nicotine dependence, cigarettes, uncomplicated: Secondary | ICD-10-CM | POA: Diagnosis not present

## 2021-11-14 DIAGNOSIS — E1142 Type 2 diabetes mellitus with diabetic polyneuropathy: Secondary | ICD-10-CM | POA: Diagnosis not present

## 2021-11-14 DIAGNOSIS — E1159 Type 2 diabetes mellitus with other circulatory complications: Secondary | ICD-10-CM | POA: Diagnosis not present

## 2021-11-14 DIAGNOSIS — E11620 Type 2 diabetes mellitus with diabetic dermatitis: Secondary | ICD-10-CM | POA: Diagnosis not present

## 2021-11-14 DIAGNOSIS — E1165 Type 2 diabetes mellitus with hyperglycemia: Secondary | ICD-10-CM | POA: Diagnosis not present

## 2021-11-14 DIAGNOSIS — D6869 Other thrombophilia: Secondary | ICD-10-CM | POA: Diagnosis not present

## 2021-11-14 DIAGNOSIS — E785 Hyperlipidemia, unspecified: Secondary | ICD-10-CM | POA: Diagnosis not present

## 2021-11-14 DIAGNOSIS — E1143 Type 2 diabetes mellitus with diabetic autonomic (poly)neuropathy: Secondary | ICD-10-CM | POA: Diagnosis not present

## 2021-11-14 DIAGNOSIS — Z008 Encounter for other general examination: Secondary | ICD-10-CM | POA: Diagnosis not present

## 2021-11-14 DIAGNOSIS — F329 Major depressive disorder, single episode, unspecified: Secondary | ICD-10-CM | POA: Diagnosis not present

## 2021-11-16 ENCOUNTER — Telehealth: Payer: Self-pay | Admitting: Pulmonary Disease

## 2021-11-16 NOTE — Telephone Encounter (Signed)
Called patient and he states he has been trying to get correct filter for oxygen machine from [employer].  Gave patient [employer] pt number [phone].  ATC number and received VM prompt. LVM to call  patient.

## 2021-11-19 DIAGNOSIS — E1159 Type 2 diabetes mellitus with other circulatory complications: Secondary | ICD-10-CM | POA: Diagnosis not present

## 2021-11-23 DIAGNOSIS — I10 Essential (primary) hypertension: Secondary | ICD-10-CM | POA: Diagnosis not present

## 2021-11-23 DIAGNOSIS — G4714 Hypersomnia due to medical condition: Secondary | ICD-10-CM | POA: Diagnosis not present

## 2021-11-23 DIAGNOSIS — G4733 Obstructive sleep apnea (adult) (pediatric): Secondary | ICD-10-CM | POA: Diagnosis not present

## 2021-11-27 DIAGNOSIS — J449 Chronic obstructive pulmonary disease, unspecified: Secondary | ICD-10-CM | POA: Diagnosis not present

## 2021-11-27 DIAGNOSIS — G4733 Obstructive sleep apnea (adult) (pediatric): Secondary | ICD-10-CM | POA: Diagnosis not present

## 2021-12-01 DIAGNOSIS — E1165 Type 2 diabetes mellitus with hyperglycemia: Secondary | ICD-10-CM | POA: Diagnosis not present

## 2021-12-01 DIAGNOSIS — I10 Essential (primary) hypertension: Secondary | ICD-10-CM | POA: Diagnosis not present

## 2021-12-01 DIAGNOSIS — J449 Chronic obstructive pulmonary disease, unspecified: Secondary | ICD-10-CM | POA: Diagnosis not present

## 2021-12-08 DIAGNOSIS — E6609 Other obesity due to excess calories: Secondary | ICD-10-CM | POA: Diagnosis not present

## 2021-12-08 DIAGNOSIS — Z1331 Encounter for screening for depression: Secondary | ICD-10-CM | POA: Diagnosis not present

## 2021-12-08 DIAGNOSIS — G459 Transient cerebral ischemic attack, unspecified: Secondary | ICD-10-CM | POA: Diagnosis not present

## 2021-12-08 DIAGNOSIS — I2510 Atherosclerotic heart disease of native coronary artery without angina pectoris: Secondary | ICD-10-CM | POA: Diagnosis not present

## 2021-12-08 DIAGNOSIS — E538 Deficiency of other specified B group vitamins: Secondary | ICD-10-CM | POA: Diagnosis not present

## 2021-12-08 DIAGNOSIS — I700 Atherosclerosis of aorta: Secondary | ICD-10-CM | POA: Diagnosis not present

## 2021-12-08 DIAGNOSIS — I25700 Atherosclerosis of coronary artery bypass graft(s), unspecified, with unstable angina pectoris: Secondary | ICD-10-CM | POA: Diagnosis not present

## 2021-12-08 DIAGNOSIS — E782 Mixed hyperlipidemia: Secondary | ICD-10-CM | POA: Diagnosis not present

## 2021-12-08 DIAGNOSIS — Z6833 Body mass index (BMI) 33.0-33.9, adult: Secondary | ICD-10-CM | POA: Diagnosis not present

## 2021-12-11 DIAGNOSIS — I25708 Atherosclerosis of coronary artery bypass graft(s), unspecified, with other forms of angina pectoris: Secondary | ICD-10-CM | POA: Diagnosis not present

## 2021-12-11 DIAGNOSIS — R0602 Shortness of breath: Secondary | ICD-10-CM | POA: Diagnosis not present

## 2021-12-11 DIAGNOSIS — I480 Paroxysmal atrial fibrillation: Secondary | ICD-10-CM | POA: Diagnosis not present

## 2021-12-20 DIAGNOSIS — E1159 Type 2 diabetes mellitus with other circulatory complications: Secondary | ICD-10-CM | POA: Diagnosis not present

## 2021-12-28 DIAGNOSIS — G4733 Obstructive sleep apnea (adult) (pediatric): Secondary | ICD-10-CM | POA: Diagnosis not present

## 2021-12-28 DIAGNOSIS — J449 Chronic obstructive pulmonary disease, unspecified: Secondary | ICD-10-CM | POA: Diagnosis not present

## 2021-12-29 DIAGNOSIS — E1165 Type 2 diabetes mellitus with hyperglycemia: Secondary | ICD-10-CM | POA: Diagnosis not present

## 2021-12-29 DIAGNOSIS — I10 Essential (primary) hypertension: Secondary | ICD-10-CM | POA: Diagnosis not present

## 2021-12-29 DIAGNOSIS — J449 Chronic obstructive pulmonary disease, unspecified: Secondary | ICD-10-CM | POA: Diagnosis not present

## 2022-01-04 ENCOUNTER — Ambulatory Visit: Payer: Medicare HMO | Admitting: "Endocrinology

## 2022-01-04 ENCOUNTER — Other Ambulatory Visit: Payer: Self-pay

## 2022-01-04 ENCOUNTER — Encounter: Payer: Self-pay | Admitting: "Endocrinology

## 2022-01-04 VITALS — BP 98/65 | HR 84 | Ht 70.0 in | Wt 319.4 lb

## 2022-01-04 DIAGNOSIS — E782 Mixed hyperlipidemia: Secondary | ICD-10-CM | POA: Diagnosis not present

## 2022-01-04 DIAGNOSIS — R69 Illness, unspecified: Secondary | ICD-10-CM | POA: Diagnosis not present

## 2022-01-04 DIAGNOSIS — E1159 Type 2 diabetes mellitus with other circulatory complications: Secondary | ICD-10-CM

## 2022-01-04 DIAGNOSIS — F17200 Nicotine dependence, unspecified, uncomplicated: Secondary | ICD-10-CM

## 2022-01-04 DIAGNOSIS — I10 Essential (primary) hypertension: Secondary | ICD-10-CM

## 2022-01-04 LAB — POCT GLYCOSYLATED HEMOGLOBIN (HGB A1C): HbA1c, POC (controlled diabetic range): 7 % (ref 0.0–7.0)

## 2022-01-04 NOTE — Patient Instructions (Signed)

## 2022-01-04 NOTE — Progress Notes (Signed)
01/04/2022  Endocrinology follow-up note   Subjective:    Patient ID: [name], male    DOB: 31-Jul-1961.  he is being seen in follow-up  for management of currently uncontrolled symptomatic type 2 diabetes, hypertension, hyperlipidemia, obesity. PMD:  Sharilyn Sites, MD.   Past Medical History:  Diagnosis Date   Abnormal myocardial perfusion study 01/01/2011   there a small to moderate sized inferobasal scar   Barrett's esophagus    Cataracts, bilateral    Chronic kidney disease    hx of kidney stones   Claudication ([place]) 11/16/2011   PV test perform shows normal   COPD (chronic obstructive pulmonary disease) ([place])    Depression    Diabetes ([place])    type 2 diabetes mellitus   Dysrhythmia    GERD (gastroesophageal reflux disease)    Glaucoma    Hernia of abdominal wall    History of radiation therapy    Right lung, SBRT- 04/16/21-04/30/21- Dr. Gery Pray   HTN (hypertension)    Hyperlipidemia    Morbid obesity ([place])    Myocardial infarction ([place]) 2008,2009,2009   OSA (obstructive sleep apnea)    on cpap   PAF (paroxysmal atrial fibrillation) ([place])    S/P colonoscopy 2009   3-4 mm transverse colon erosions likely secondary to  ASA   S/P endoscopy 10/2010   moderate erosive gastritis, Barrett's esophagus 1-2cm   Sleep apnea    SOB (shortness of breath) 11/03/2007   2D Echo EF 50%-55%   Stroke ([place]) 2017   Past Surgical History:  Procedure Laterality Date   BIOPSY N/A 04/29/2015   Procedure: BIOPSY;  Surgeon: Danie Binder, MD;  Location: [facility];  Service: Endoscopy;  Laterality: N/A;   BIOPSY  08/01/2018   Procedure: BIOPSY;  Surgeon: Danie Binder, MD;  [facility]: [facility];  Service: Endoscopy;;  esophageal   BIOPSY  10/12/2021   Procedure: BIOPSY;  Surgeon: Eloise Harman, DO;  [facility];  Service: Endoscopy;;   CABG X 4  03/2008   CARDIAC CATHETERIZATION  2009   stent placement to the left circumflex a 2.25    CARDIAC  CATHETERIZATION  07/08/2010   CARDIAC CATHETERIZATION N/A 11/02/2019   COLONOSCOPY WITH PROPOFOL N/A 09/27/2017   normal ileum, twenty 4 to 8 mm polyps in the sigmoid colon, descending colon, splenic flexure, transverse colon, ascending colon, cecum.  An additional three 2 to 4 mm polyps in the rectum and the descending colon.  Diverticulosis, internal hemorrhoids.  Surgical pathology found the polyps to be one fragment of hyperplastic polyp and 22 fragments of tubular adenoma..  Recommended repeat colonoscop   COLONOSCOPY WITH PROPOFOL N/A 10/12/2021   Procedure: COLONOSCOPY WITH PROPOFOL;  Surgeon: Eloise Harman, DO;  [facility];  Service: Endoscopy;  Laterality: N/A;  1:30pm   CORONARY ARTERY BYPASS GRAFT  2008   4 vessels   CORONARY STENT PLACEMENT  12/29/12   CORONARY STENT PLACEMENT  12/2012   ESOPHAGEAL DILATION N/A 04/29/2015   Procedure: ESOPHAGEAL DILATION 15 mm, 16 mm;  Surgeon: Danie Binder, MD;  Location: [facility];  Service: Endoscopy;  Laterality: N/A;   ESOPHAGOGASTRODUODENOSCOPY  09/2011   Barrett's esophagus, no dysplasia on biopsies. Distal esophagitis. Status post dilation. Moderate gastritis and duodenitis, but biopsies benign. Next EGD in November 2015 for surveillance of Barrett's esophagus.   ESOPHAGOGASTRODUODENOSCOPY (EGD) WITH PROPOFOL N/A 04/29/2015   SLF: 1. Barretts esophagus 2. Moderate non-erosive gastritis.  ESOPHAGOGASTRODUODENOSCOPY (EGD) WITH PROPOFOL N/A 08/01/2018   Barrett's, repeat in 5 years. Empiric dilatation, mild gastritis   HERNIA REPAIR     ventral hernia repair   LEFT HEART CATHETERIZATION WITH CORONARY/GRAFT ANGIOGRAM N/A 12/29/2012   Procedure: LEFT HEART CATHETERIZATION WITH Beatrix Fetters;  Surgeon: Troy Sine, MD;  Location: [facility] CATH LAB;  Service: Cardiovascular;  Laterality: N/A;   ORIF FIBULA FRACTURE Right 06/03/2015   Procedure: OPEN REDUCTION INTERNAL FIXATION (ORIF) DISTAL FIBULA  FRACTURE;  Surgeon: Garald Balding, MD;  Location: [facility];  Service: Orthopedics;  Laterality: Right;   PERCUTANEOUS CORONARY STENT INTERVENTION (PCI-S)  12/29/2012   Procedure: PERCUTANEOUS CORONARY STENT INTERVENTION (PCI-S);  Surgeon: Troy Sine, MD;  Location: [facility] CATH LAB;  Service: Cardiovascular;;   POLYPECTOMY  09/27/2017   Procedure: POLYPECTOMY;  Surgeon: Danie Binder, MD;  [facility];  Service: Endoscopy;;  cecal polyp, ascending polyps x6, transverse colon polyps x6, splenic flexure polyps x2, descending colon polyps x6, sigmoid  colon polyp x1, rectal polyp x1    POLYPECTOMY  10/12/2021   Procedure: POLYPECTOMY;  Surgeon: Eloise Harman, DO;  [facility];  Service: Endoscopy;;   SAVORY DILATION  09/06/2011   Procedure: SAVORY DILATION;  Surgeon: Dorothyann Peng, MD;  [facility];  Service: Endoscopy;  Laterality: N/A;  Dilated with 80m   SAVORY DILATION N/A 08/01/2018   Procedure: SAVORY DILATION;  Surgeon: FDanie Binder MD;  [facility];  Service: Endoscopy;  Laterality: N/A;   Social History   Socioeconomic History   Marital status: Divorced    Spouse name: Not on file   Number of children: Not on file   Years of education: Not on file   Highest education level: Not on file  Occupational History   Not on file  Tobacco Use   Smoking status: Every Day    Packs/day: 0.50    Years: 30.00    Pack years: 15.00    Types: Cigarettes   Smokeless tobacco: Never   Tobacco comments:    smokes 1/2 pack per day 09/15/21  Vaping Use   Vaping Use: Never used  Substance and Sexual Activity   Alcohol use: Yes    Comment: occ beer   Drug use: No   Sexual activity: Never  Other Topics Concern   Not on file  Social History Narrative   Not on file   Social Determinants of Health   Financial Resource Strain: Not on file  Food Insecurity: Not on file  Transportation Needs: Not on file  Physical Activity: Not on file  Stress: Not on file  Social  Connections: Not on file   Outpatient Encounter Medications as of 01/04/2022  Medication Sig   albuterol (PROVENTIL) (2.5 MG/3ML) 0.083% nebulizer solution Take 3 mLs (2.5 mg total) by nebulization every 6 (six) hours as needed for wheezing or shortness of breath.   albuterol (VENTOLIN HFA) 108 (90 Base) MCG/ACT inhaler INHALE TWO PUFFS into THE lungs EVERY 6 HOURS AS NEEDED FOR SHORTNESS OF BREATH OR wheezing   amLODipine (NORVASC) 10 MG tablet Take 10 mg by mouth in the morning.   apixaban (ELIQUIS) 5 MG TABS tablet Take 1 tablet (5 mg total) by mouth 2 (two) times daily.   blood glucose meter kit and supplies Dispense based on patient and insurance preference. Use up to two times daily as directed. (FOR ICD-E11.65)   Blood Glucose Monitoring Suppl (ONETOUCH VERIO) w/Device KIT 1 each  by Does not apply route as needed.   Continuous Blood Gluc Receiver (FREESTYLE LIBRE 2 READER) DEVI As directed   Continuous Blood Gluc Sensor (FREESTYLE LIBRE 2 SENSOR) MISC 1 Piece by Does not apply route every 14 (fourteen) days.   Cyanocobalamin (VITAMIN B-12 IJ) Inject 1,000 mcg as directed every 30 (thirty) days.   diazepam (VALIUM) 5 MG tablet Take 5 mg by mouth 2 (two) times daily.   Dulaglutide 1.5 MG/0.5ML SOPN Inject 1.5 mg into the skin once a week.   escitalopram (LEXAPRO) 20 MG tablet Take 20 mg by mouth in the morning.   fenofibrate (TRICOR) 145 MG tablet Take 145 mg by mouth in the morning.   Fluticasone-Umeclidin-Vilant 100-62.5-25 MCG/INH AEPB Inhale 1 puff into the lungs daily. Trelegy   furosemide (LASIX) 80 MG tablet Take 80 mg by mouth in the morning.   glipiZIDE (GLUCOTROL XL) 5 MG 24 hr tablet Take 1 tablet (5 mg total) by mouth daily with breakfast.   insulin regular human CONCENTRATED (HUMULIN R U-500 KWIKPEN) 500 UNIT/ML kwikpen Inject 70 Units into the skin 3 (three) times daily with meals. Only when blood glucose is above 90 and eating.   isosorbide mononitrate (IMDUR) 120 MG 24 hr  tablet Take 120 mg by mouth in the morning.   Lancets (ONETOUCH DELICA PLUS [id number]) MISC USE TO check blood sugar FOUR TIMES DAILY AS DIRECTED   lisinopril (ZESTRIL) 10 MG tablet Take 1 tablet (10 mg total) by mouth daily.   meclizine (ANTIVERT) 25 MG tablet Take 25 mg by mouth every 6 (six) hours as needed for dizziness or nausea.   metoprolol tartrate (LOPRESSOR) 100 MG tablet Take 1 tablet (100 mg total) by mouth 2 (two) times daily. OFFICE VISIT NEEDED BEFORE ADDITIONAL REFILLS   modafinil (PROVIGIL) 200 MG tablet Take 400 mg by mouth in the morning.   nitroGLYCERIN (NITROLINGUAL) 0.4 MG/SPRAY spray Place 1 spray under the tongue every 5 (five) minutes x 3 doses as needed for chest pain.   ONETOUCH VERIO test strip USE TO check blood sugar FOUR TIMES DAILY AS DIRECTED   pantoprazole (PROTONIX) 40 MG tablet Take 1 tablet (40 mg total) by mouth 2 (two) times daily before a meal.   polyethylene glycol-electrolytes (TRILYTE) 420 g solution Take 4,000 mLs by mouth as directed.   potassium chloride SA (K-DUR,KLOR-CON) 20 MEQ tablet Take 1 tablet (20 mEq total) by mouth 2 (two) times daily.   rosuvastatin (CRESTOR) 20 MG tablet Take 20 mg by mouth in the morning.   TRUEPLUS PEN NEEDLES 31G X 8 MM MISC USE AS DIRECTED TWICE DAILY   Vitamin D, Ergocalciferol, (DRISDOL) 1.25 MG (50000 UNIT) CAPS capsule Take 1 capsule (50,000 Units total) by mouth every 7 (seven) days. (Patient taking differently: Take 50,000 Units by mouth every Friday.)   No facility-administered encounter medications on file as of 01/04/2022.    ALLERGIES: Allergies  Allergen Reactions   Contrast Media [Iodinated Contrast Media] Other (See Comments)    Pt must be premedicated before given contrast media - stops heart   Iohexol Other (See Comments)     Consult with radiologist before pre meds are given.Desc: PT. STATES HEART STOPPED HAS TO BE PREMED.     VACCINATION STATUS: Immunization History  Administered Date(s)  Administered   Influenza,inj,Quad PF,6+ Mos 09/22/2020    Diabetes He presents for his follow-up diabetic visit. He has type 2 diabetes mellitus. Onset time: He was diagnosed at approximate age of 89 years. His disease course  has been stable. There are no hypoglycemic associated symptoms. Pertinent negatives for hypoglycemia include no confusion, headaches, pallor or seizures. Pertinent negatives for diabetes include no blurred vision, no chest pain, no fatigue, no polydipsia, no polyphagia, no polyuria and no weakness. There are no hypoglycemic complications. Symptoms are stable. Diabetic complications include a CVA and heart disease. Risk factors for coronary artery disease include diabetes mellitus, dyslipidemia, hypertension, male sex, obesity, tobacco exposure, sedentary lifestyle and family history. Current diabetic treatments: He was supposed to be on basal insulin, unfortunately he ran out and could not afford refills and he did not call clinic for alternatives. He is compliant with treatment none of the time. His weight is decreasing steadily. He is following a generally unhealthy diet. When asked about meal planning, he reported none. He has not had a previous visit with a dietitian. He never participates in exercise. His home blood glucose trend is decreasing steadily. His breakfast blood glucose range is generally 130-140 mg/dl. His lunch blood glucose range is generally 130-140 mg/dl. His dinner blood glucose range is generally 140-180 mg/dl. His bedtime blood glucose range is generally 140-180 mg/dl. His overall blood glucose range is 140-180 mg/dl. Obeso presents with his CGM.  AGP shows continued improvement in his profile.  72% time range, 22% slightly above range.  No significant sustained hypoglycemia.  His point-of-care A1c is 7%, progressively improving from 11%.      ) An ACE inhibitor/angiotensin II receptor blocker is being taken. He does not see a podiatrist.Eye exam is not  current.  Hyperlipidemia This is a chronic problem. The current episode started more than 1 year ago. The problem is uncontrolled. Recent lipid tests were reviewed and are high. Exacerbating diseases include diabetes and obesity. Pertinent negatives include no chest pain, myalgias or shortness of breath. Current antihyperlipidemic treatment includes statins. Compliance problems include medication cost and psychosocial issues.  Risk factors for coronary artery disease include dyslipidemia, diabetes mellitus, hypertension, male sex, obesity, family history and a sedentary lifestyle.  Hypertension This is a chronic problem. The current episode started more than 1 year ago. The problem is uncontrolled. Pertinent negatives include no blurred vision, chest pain, headaches, neck pain, palpitations or shortness of breath. Risk factors for coronary artery disease include diabetes mellitus, dyslipidemia, male gender, obesity, family history, sedentary lifestyle and smoking/tobacco exposure. Past treatments include ACE inhibitors. Hypertensive end-organ damage includes CVA.    Review of systems  Constitutional: + Minimally fluctuating body weight,  current  Body mass index is 45.83 kg/m. , no fatigue, no subjective hyperthermia, no subjective hypothermia    Objective:    BP 98/65    Pulse 84    Ht 5' 10" (1.778 m)    Wt (!) 319 lb 6.4 oz (144.9 kg)    BMI 45.83 kg/m   Wt Readings from Last 3 Encounters:  01/04/22 (!) 319 lb 6.4 oz (144.9 kg)  10/08/21 (!) 325 lb 1.3 oz (147.5 kg)  10/06/21 (!) 324 lb 12.8 oz (147.3 kg)      Physical Exam- Limited  Constitutional:  Body mass index is 45.83 kg/m. , not in acute distress, normal state of mind    CMP ( most recent) CMP     Component Value Date/Time   NA 140 09/23/2021 1412   K 4.3 09/23/2021 1412   CL 101 09/23/2021 1412   CO2 21 09/23/2021 1412   GLUCOSE 153 (H) 09/23/2021 1412   GLUCOSE 209 (H) 01/30/2019 1320   BUN 14 09/23/2021 1412  CREATININE 1.28 (H) 09/23/2021 1412   CALCIUM 9.9 09/23/2021 1412   PROT 6.9 09/23/2021 1412   ALBUMIN 4.1 09/23/2021 1412   AST 25 09/23/2021 1412   ALT 27 09/23/2021 1412   ALKPHOS 71 09/23/2021 1412   BILITOT 0.4 09/23/2021 1412   GFRNONAA 80 12/04/2020 0000   GFRAA 93 12/04/2020 0000   Diabetic Labs (most recent): Lab Results  Component Value Date   HGBA1C 7.0 01/04/2022   HGBA1C 7.5 (A) 10/06/2021   HGBA1C 6.3 06/09/2021     Lipid Panel ( most recent) Lipid Panel     Component Value Date/Time   CHOL 149 12/04/2020 0000   CHOL 132 07/03/2019 1324   TRIG 230 (A) 12/04/2020 0000   HDL 30 (A) 12/04/2020 0000   HDL 30 (L) 07/03/2019 1324   CHOLHDL 6.1 04/28/2014 0121   VLDL 61 (H) 04/28/2014 0121   LDLCALC 80 12/04/2020 0000   LDLCALC 53 07/03/2019 1324     Assessment & Plan:   1. DM type 2 causing vascular disease ([place])  - Patient has currently uncontrolled symptomatic type 2 DM since  61 years of age.  Ra presents with his CGM.  AGP shows continued improvement in his profile.  72% time range, 22% slightly above range.  No significant sustained hypoglycemia.  His point-of-care A1c is 7%, progressively improving from 11%.    -his diabetes is complicated by coronary artery disease, CVA, obesity/sedentary life, chronic heavy smoking and MARKEVIUS TROMBETTA remains at extremely high risk  for more acute and chronic complications which include CAD, CVA, CKD, retinopathy, and neuropathy. These are all discussed in detail with the patient.  - I have counseled him on diet management and weight loss, by adopting a carbohydrate restricted/protein rich diet.  - he acknowledges that there is a room for improvement in his food and drink choices. - Suggestion is made for him to avoid simple carbohydrates  from his diet including Cakes, Sweet Desserts, Ice Cream, Soda (diet and regular), Sweet Tea, Candies, Chips, Cookies, Store Bought Juices, Alcohol in Excess of  1-2 drinks a  day, Artificial Sweeteners,  Coffee Creamer, and "Sugar-free" Products, Lemonade. This will help patient to have more stable blood glucose profile and potentially avoid unintended weight gain.  - I encouraged him to switch to  unprocessed or minimally processed complex starch and increased protein intake (animal or plant source), fruits, and vegetables.  - he is advised to stick to a routine mealtimes to eat 3 meals  a day and avoid unnecessary snacks ( to snack only to correct hypoglycemia).   - I have approached him with the following individualized plan to manage diabetes and patient agrees:   - He continues to respond and benefit from  the U500  insulin favorably.   -Priority is still to avoid hypoglycemia in this patient.    -He is advised to continue U500 70 units 3 times a day for [place] blood glucose readings above 90 mg per DL,  associated with strict monitoring of blood glucose 4 times a day-before meals and at bedtime. -He is benefiting from the CGM device.  He is advised to continue to wear his CGM at all times.    -He has benefited from glipizide treatment.  He is advised to continue glipizide 5 mg XL p.o. daily at breakfast.   - He does not tolerate metformin due to GI side effects.  -He has benefited from low-dose Trulicity.  His Trulicity was increased to 1.5 mg subcutaneously  weekly in the interim.  He is advised to continue.   -Patient is encouraged to call clinic for blood glucose levels less than 70 or above 200 mg /dl.   - Patient specific target  A1c;  LDL, HDL, Triglycerides,  were discussed in detail.  2) BP/HTN:  -His blood pressure is controlled to target. The patient was counseled on the dangers of tobacco use, and was advised to quit.  Reviewed strategies to maximize success, including removing cigarettes and smoking materials from environment.  He is currently on lisinopril 10 mg p.o. daily.    3) Lipids/HPL: He has had history of severe  hypertriglyceridemia, recently improving to 230 from 469, mainly associated with improvement in his glycemic profile.  He is advised to continue Trilipix 135 mg p.o. nightly, continue Crestor 20 mg p.o. nightly, fenofibrate 145 mg p.o. nightly.       4)  Weight/Diet: His BMI is 16.1-  -clearly complicating his diabetes care.  He is a candidate for modest weight loss.  CDE Consult has been  initiated , exercise, and detailed carbohydrates information provided.  5) vitamin D deficiency: He is on ongoing supplement with vitamin D2 50,000 units weekly for 12 weeks.  6) Chronic Care/Health Maintenance:  -he  is on ACEI/ARB and Statin medications and  is encouraged to continue to follow up with Ophthalmology, Dentist,  Podiatrist at least yearly or according to recommendations, and advised to  quit smoking. I have recommended yearly flu vaccine and pneumonia vaccination at least every 5 years; moderate intensity exercise for up to 150 minutes weekly; and  sleep for at least 7 hours a day.  The patient was counseled on the dangers of tobacco use, and was advised to quit.  Reviewed strategies to maximize success, including removing cigarettes and smoking materials from environment.    - I advised patient to maintain close follow up with Sharilyn Sites, MD for primary care needs.    I spent 44 minutes in the care of the patient today including review of labs from [place], Lipids, Thyroid Function, Hematology (current and previous including abstractions from other facilities); face-to-face time discussing  his blood glucose readings/logs, discussing hypoglycemia and hyperglycemia episodes and symptoms, medications doses, his options of short and long term treatment based on the latest standards of care / guidelines;  discussion about incorporating lifestyle medicine;  and documenting the encounter.    Please refer to Patient Instructions for Blood Glucose Monitoring and Insulin/Medications Dosing Guide"  in  media tab for additional information. Please  also refer to " Patient Self Inventory" in the Media  tab for reviewed elements of pertinent patient history.  [name] participated in the discussions, expressed understanding, and voiced agreement with the above plans.  All questions were answered to his satisfaction. he is encouraged to contact clinic should he have any questions or concerns prior to his return visit.    Follow up plan: - Return in about 4 months (around 05/06/2022) for F/U with Pre-visit Labs, Meter, Logs, A1c here.Glade Lloyd, MD Phone: [phone]  Fax: [phone]   01/04/2022, 5:46 PM   This note was partially dictated with voice recognition software. Similar sounding words can be transcribed inadequately or may not  be corrected upon review.

## 2022-01-05 DIAGNOSIS — E6609 Other obesity due to excess calories: Secondary | ICD-10-CM | POA: Diagnosis not present

## 2022-01-05 DIAGNOSIS — I700 Atherosclerosis of aorta: Secondary | ICD-10-CM | POA: Diagnosis not present

## 2022-01-05 DIAGNOSIS — E559 Vitamin D deficiency, unspecified: Secondary | ICD-10-CM | POA: Diagnosis not present

## 2022-01-05 DIAGNOSIS — E538 Deficiency of other specified B group vitamins: Secondary | ICD-10-CM | POA: Diagnosis not present

## 2022-01-05 DIAGNOSIS — E1159 Type 2 diabetes mellitus with other circulatory complications: Secondary | ICD-10-CM | POA: Diagnosis not present

## 2022-01-05 DIAGNOSIS — G4733 Obstructive sleep apnea (adult) (pediatric): Secondary | ICD-10-CM | POA: Diagnosis not present

## 2022-01-05 DIAGNOSIS — E782 Mixed hyperlipidemia: Secondary | ICD-10-CM | POA: Diagnosis not present

## 2022-01-05 DIAGNOSIS — I25700 Atherosclerosis of coronary artery bypass graft(s), unspecified, with unstable angina pectoris: Secondary | ICD-10-CM | POA: Diagnosis not present

## 2022-01-05 DIAGNOSIS — Z6833 Body mass index (BMI) 33.0-33.9, adult: Secondary | ICD-10-CM | POA: Diagnosis not present

## 2022-01-05 DIAGNOSIS — J441 Chronic obstructive pulmonary disease with (acute) exacerbation: Secondary | ICD-10-CM | POA: Diagnosis not present

## 2022-01-06 LAB — COMPREHENSIVE METABOLIC PANEL
ALT: 24 IU/L (ref 0–44)
AST: 22 IU/L (ref 0–40)
Albumin/Globulin Ratio: 1.7 (ref 1.2–2.2)
Albumin: 4.2 g/dL (ref 3.8–4.9)
Alkaline Phosphatase: 75 IU/L (ref 44–121)
BUN/Creatinine Ratio: 9 (ref 9–20)
BUN: 9 mg/dL (ref 6–24)
Bilirubin Total: 0.5 mg/dL (ref 0.0–1.2)
CO2: 22 mmol/L (ref 20–29)
Calcium: 10 mg/dL (ref 8.7–10.2)
Chloride: 100 mmol/L (ref 96–106)
Creatinine, Ser: 0.97 mg/dL (ref 0.76–1.27)
Globulin, Total: 2.5 g/dL (ref 1.5–4.5)
Glucose: 192 mg/dL — ABNORMAL HIGH (ref 70–99)
Potassium: 4.1 mmol/L (ref 3.5–5.2)
Sodium: 139 mmol/L (ref 134–144)
Total Protein: 6.7 g/dL (ref 6.0–8.5)
eGFR: 90 mL/min/{1.73_m2} (ref >59)

## 2022-01-06 LAB — LIPID PANEL
Chol/HDL Ratio: 5.1 ratio — ABNORMAL HIGH (ref 0.0–5.0)
Cholesterol, Total: 147 mg/dL (ref 100–199)
HDL: 29 mg/dL — ABNORMAL LOW (ref >39)
LDL Chol Calc (NIH): 77 mg/dL (ref 0–99)
Triglycerides: 244 mg/dL — ABNORMAL HIGH (ref 0–149)
VLDL Cholesterol Cal: 41 mg/dL — ABNORMAL HIGH (ref 5–40)

## 2022-01-06 LAB — T4, FREE: Free T4: 1.39 ng/dL (ref 0.82–1.77)

## 2022-01-06 LAB — TSH: TSH: 1.75 u[IU]/mL (ref 0.450–4.500)

## 2022-01-12 ENCOUNTER — Other Ambulatory Visit: Payer: Self-pay

## 2022-01-12 DIAGNOSIS — E782 Mixed hyperlipidemia: Secondary | ICD-10-CM

## 2022-01-12 DIAGNOSIS — E1159 Type 2 diabetes mellitus with other circulatory complications: Secondary | ICD-10-CM

## 2022-01-12 DIAGNOSIS — E538 Deficiency of other specified B group vitamins: Secondary | ICD-10-CM | POA: Diagnosis not present

## 2022-01-12 NOTE — Progress Notes (Signed)
Lab orders updated

## 2022-01-20 DIAGNOSIS — E1165 Type 2 diabetes mellitus with hyperglycemia: Secondary | ICD-10-CM | POA: Diagnosis not present

## 2022-01-26 DIAGNOSIS — G4733 Obstructive sleep apnea (adult) (pediatric): Secondary | ICD-10-CM | POA: Diagnosis not present

## 2022-01-26 DIAGNOSIS — J449 Chronic obstructive pulmonary disease, unspecified: Secondary | ICD-10-CM | POA: Diagnosis not present

## 2022-01-29 DIAGNOSIS — I10 Essential (primary) hypertension: Secondary | ICD-10-CM | POA: Diagnosis not present

## 2022-01-29 DIAGNOSIS — E1165 Type 2 diabetes mellitus with hyperglycemia: Secondary | ICD-10-CM | POA: Diagnosis not present

## 2022-01-29 DIAGNOSIS — J449 Chronic obstructive pulmonary disease, unspecified: Secondary | ICD-10-CM | POA: Diagnosis not present

## 2022-02-02 DIAGNOSIS — G4733 Obstructive sleep apnea (adult) (pediatric): Secondary | ICD-10-CM | POA: Diagnosis not present

## 2022-02-02 DIAGNOSIS — G2581 Restless legs syndrome: Secondary | ICD-10-CM | POA: Diagnosis not present

## 2022-02-02 DIAGNOSIS — J9611 Chronic respiratory failure with hypoxia: Secondary | ICD-10-CM | POA: Diagnosis not present

## 2022-02-02 DIAGNOSIS — J449 Chronic obstructive pulmonary disease, unspecified: Secondary | ICD-10-CM | POA: Diagnosis not present

## 2022-02-04 ENCOUNTER — Telehealth: Payer: Self-pay | Admitting: Pulmonary Disease

## 2022-02-04 DIAGNOSIS — J432 Centrilobular emphysema: Secondary | ICD-10-CM

## 2022-02-04 DIAGNOSIS — G4733 Obstructive sleep apnea (adult) (pediatric): Secondary | ICD-10-CM

## 2022-02-04 NOTE — Telephone Encounter (Signed)
Called and spoke with patient who is calling because he needs filters for [employer] oxygen machine. DME order has been placed. Advised him if he needed anything else to call [employer] and speak with them. He expressed understanding. Nothing further needed at this time.  ?

## 2022-02-12 ENCOUNTER — Other Ambulatory Visit: Payer: Self-pay | Admitting: "Endocrinology

## 2022-02-20 DIAGNOSIS — E1165 Type 2 diabetes mellitus with hyperglycemia: Secondary | ICD-10-CM | POA: Diagnosis not present

## 2022-02-23 DIAGNOSIS — R070 Pain in throat: Secondary | ICD-10-CM | POA: Diagnosis not present

## 2022-02-23 DIAGNOSIS — K219 Gastro-esophageal reflux disease without esophagitis: Secondary | ICD-10-CM | POA: Diagnosis not present

## 2022-02-26 ENCOUNTER — Other Ambulatory Visit: Payer: Self-pay | Admitting: Pulmonary Disease

## 2022-02-26 DIAGNOSIS — J449 Chronic obstructive pulmonary disease, unspecified: Secondary | ICD-10-CM | POA: Diagnosis not present

## 2022-02-26 DIAGNOSIS — G4733 Obstructive sleep apnea (adult) (pediatric): Secondary | ICD-10-CM | POA: Diagnosis not present

## 2022-02-28 DIAGNOSIS — I5030 Unspecified diastolic (congestive) heart failure: Secondary | ICD-10-CM | POA: Diagnosis not present

## 2022-02-28 DIAGNOSIS — N1831 Chronic kidney disease, stage 3a: Secondary | ICD-10-CM | POA: Diagnosis not present

## 2022-02-28 DIAGNOSIS — I130 Hypertensive heart and chronic kidney disease with heart failure and stage 1 through stage 4 chronic kidney disease, or unspecified chronic kidney disease: Secondary | ICD-10-CM | POA: Diagnosis not present

## 2022-02-28 DIAGNOSIS — I4891 Unspecified atrial fibrillation: Secondary | ICD-10-CM | POA: Diagnosis not present

## 2022-03-03 ENCOUNTER — Other Ambulatory Visit: Payer: Self-pay | Admitting: Nurse Practitioner

## 2022-03-03 ENCOUNTER — Telehealth: Payer: Self-pay

## 2022-03-03 NOTE — Telephone Encounter (Signed)
[facility] patient [name] asking him to bring Sd card from cpap machine to appt tomorrow ?

## 2022-03-04 ENCOUNTER — Encounter: Payer: Self-pay | Admitting: Pulmonary Disease

## 2022-03-04 ENCOUNTER — Ambulatory Visit: Payer: Medicare HMO | Admitting: Pulmonary Disease

## 2022-03-04 VITALS — BP 132/86 | HR 82 | Temp 97.6°F | Ht 70.0 in | Wt 323.6 lb

## 2022-03-04 DIAGNOSIS — F17200 Nicotine dependence, unspecified, uncomplicated: Secondary | ICD-10-CM

## 2022-03-04 DIAGNOSIS — G4733 Obstructive sleep apnea (adult) (pediatric): Secondary | ICD-10-CM | POA: Diagnosis not present

## 2022-03-04 DIAGNOSIS — Z720 Tobacco use: Secondary | ICD-10-CM

## 2022-03-04 DIAGNOSIS — J449 Chronic obstructive pulmonary disease, unspecified: Secondary | ICD-10-CM | POA: Diagnosis not present

## 2022-03-04 DIAGNOSIS — G2581 Restless legs syndrome: Secondary | ICD-10-CM | POA: Diagnosis not present

## 2022-03-04 DIAGNOSIS — E538 Deficiency of other specified B group vitamins: Secondary | ICD-10-CM | POA: Diagnosis not present

## 2022-03-04 DIAGNOSIS — J432 Centrilobular emphysema: Secondary | ICD-10-CM | POA: Diagnosis not present

## 2022-03-04 DIAGNOSIS — R69 Illness, unspecified: Secondary | ICD-10-CM | POA: Diagnosis not present

## 2022-03-04 DIAGNOSIS — C3411 Malignant neoplasm of upper lobe, right bronchus or lung: Secondary | ICD-10-CM

## 2022-03-04 DIAGNOSIS — J9611 Chronic respiratory failure with hypoxia: Secondary | ICD-10-CM | POA: Diagnosis not present

## 2022-03-04 NOTE — Patient Instructions (Signed)
?  Use Trelegy daily ?Rx to DME for full face mask ? ?You have to QUIT smoking !! ? ?CT chest wo con in Nov 2023 ?

## 2022-03-04 NOTE — Assessment & Plan Note (Signed)
1 year follow-up, surveillance CT in November 2023 will be scheduled ?

## 2022-03-04 NOTE — Assessment & Plan Note (Signed)
Continue on Trelegy. ?Albuterol for rescue ?

## 2022-03-04 NOTE — Assessment & Plan Note (Signed)
Unable to obtain download on his CPAP.  We will provide him prescription for a larger size mask. ?He is compliant by report ?Weight loss encouraged, compliance with goal of at least 4-6 hrs every night is the expectation. ?Advised against medications with sedative side effects ?Cautioned against driving when sleepy - understanding that sleepiness will vary on a day to day basis ? ?

## 2022-03-04 NOTE — Progress Notes (Signed)
? ?  Subjective:  ? ? Patient ID: [name], male    DOB: 02/23/1961, 61 y.o.   MRN: [id number] ? ?HPI ? ?61 yo obese smoker  for FU of  COPD and OSA and lung nodules. ?S/p SBRT 04/2021 for enlarging right upper lobe nodule/lung cancer ?OSA -on CPAP 8 cm + 3L O2 ?He smoked more than 40 pack years ?  ?PMH -CABG 2008 follows with [facility],  ?diabetes, insulin requiring , PAF on eliquis ?Cor angio 03/2020  2/4 grafts patent, LIMA to LAD and SVG to Ramus. Occluded SVG to RCA, occluded SVG to OM. Both of these are old findings ?nml LVEF ?  ? Cardiolite SPECT study 01/2020 revealed large perfusion abnormality in the inferior/lateral region. ?  ?Lung nodules were initially noted on a CT scan in 2017  ?He continues to smoke about half pack per day.  Breathing is at baseline.  Uses albuterol MDI for rescue has not needed albuterol nebs much. ?Compliant with Trelegy. ?Weight is unchanged. ?CPAP pressure is okay, no dryness, feels like mask may be too small and he has a large leak ? ? ? ?Significant tests/ events reviewed ? ?CT chest without con 09/2021 -decreased size of right upper lobe nodule to 7 mm other nodules are stable. ?  ?CT chest wo con 03/2021 >> subsolid 1.5 x 1.5 cm nodule RUL increasing in size & density , other nodules stable ?  ?PFTs 06/2020 moderate airway obstruction with ratio of 70, FEV1 1.90/51%, FVC 2.70/55%, no bronchodilator response, TLC 76%, DLCO 16.5/58% ?  ?PET 09/2020 >> SUV 2.1 ?  ?CT chest 06/2020 RUL GG 12 x 10 mmnodule   ?  ?CT chest 12/2018 advanced changes of emphysema , multiple noncalcified nodules unchanged compared to 09/2018, largest right upper lobe 9 mm slight increased compared to 12/2016 ?  ?NPSG 11/2007 mild OSA 5.7/hour ?07/2015 NPSG AHI 16/h ?  ?06/2016 CPAP titration-2 9 7  pounds, no O2 required CPAP 8cm ? ?Review of Systems ?neg for any significant sore throat, dysphagia, itching, sneezing, nasal congestion or excess/ purulent secretions, fever, chills, sweats, unintended  wt loss, pleuritic or exertional cp, hempoptysis, orthopnea pnd or change in chronic leg swelling. Also denies presyncope, palpitations, heartburn, abdominal pain, nausea, vomiting, diarrhea or change in bowel or urinary habits, dysuria,hematuria, rash, arthralgias, visual complaints, headache, numbness weakness or ataxia. ? ?   ?Objective:  ? Physical Exam ? ?Gen. Pleasant, obese, in no distress ?ENT - no lesions, no post nasal drip ?Neck: No JVD, no thyromegaly, no carotid bruits ?Lungs: no use of accessory muscles, no dullness to percussion, decreased without rales or rhonchi  ?Cardiovascular: Rhythm regular, heart sounds  normal, no murmurs or gallops, no peripheral edema ?Musculoskeletal: No deformities, no cyanosis or clubbing , no tremors ? ? ? ?   ?Assessment & Plan:  ? ? ?

## 2022-03-04 NOTE — Assessment & Plan Note (Signed)
Counseling was provided for tobacco cessation.  He has cut down to half pack per day and will try to cut down further.  Was not ready to commit to a quit date ?

## 2022-03-08 DIAGNOSIS — G4733 Obstructive sleep apnea (adult) (pediatric): Secondary | ICD-10-CM | POA: Diagnosis not present

## 2022-03-08 DIAGNOSIS — Z79899 Other long term (current) drug therapy: Secondary | ICD-10-CM | POA: Diagnosis not present

## 2022-03-08 DIAGNOSIS — G4714 Hypersomnia due to medical condition: Secondary | ICD-10-CM | POA: Diagnosis not present

## 2022-03-08 DIAGNOSIS — E662 Morbid (severe) obesity with alveolar hypoventilation: Secondary | ICD-10-CM | POA: Diagnosis not present

## 2022-03-11 DIAGNOSIS — I10 Essential (primary) hypertension: Secondary | ICD-10-CM | POA: Diagnosis not present

## 2022-03-11 DIAGNOSIS — I25708 Atherosclerosis of coronary artery bypass graft(s), unspecified, with other forms of angina pectoris: Secondary | ICD-10-CM | POA: Diagnosis not present

## 2022-03-11 DIAGNOSIS — I480 Paroxysmal atrial fibrillation: Secondary | ICD-10-CM | POA: Diagnosis not present

## 2022-03-16 DIAGNOSIS — M25551 Pain in right hip: Secondary | ICD-10-CM | POA: Diagnosis not present

## 2022-03-16 DIAGNOSIS — E1140 Type 2 diabetes mellitus with diabetic neuropathy, unspecified: Secondary | ICD-10-CM | POA: Diagnosis not present

## 2022-03-16 DIAGNOSIS — I5030 Unspecified diastolic (congestive) heart failure: Secondary | ICD-10-CM | POA: Diagnosis not present

## 2022-03-16 DIAGNOSIS — Z6841 Body Mass Index (BMI) 40.0 and over, adult: Secondary | ICD-10-CM | POA: Diagnosis not present

## 2022-03-16 DIAGNOSIS — M1990 Unspecified osteoarthritis, unspecified site: Secondary | ICD-10-CM | POA: Diagnosis not present

## 2022-03-23 DIAGNOSIS — E1165 Type 2 diabetes mellitus with hyperglycemia: Secondary | ICD-10-CM | POA: Diagnosis not present

## 2022-03-28 DIAGNOSIS — J449 Chronic obstructive pulmonary disease, unspecified: Secondary | ICD-10-CM | POA: Diagnosis not present

## 2022-03-28 DIAGNOSIS — G4733 Obstructive sleep apnea (adult) (pediatric): Secondary | ICD-10-CM | POA: Diagnosis not present

## 2022-03-31 ENCOUNTER — Telehealth: Payer: Self-pay | Admitting: "Endocrinology

## 2022-03-31 DIAGNOSIS — J449 Chronic obstructive pulmonary disease, unspecified: Secondary | ICD-10-CM | POA: Diagnosis not present

## 2022-03-31 DIAGNOSIS — I10 Essential (primary) hypertension: Secondary | ICD-10-CM | POA: Diagnosis not present

## 2022-03-31 DIAGNOSIS — E1165 Type 2 diabetes mellitus with hyperglycemia: Secondary | ICD-10-CM | POA: Diagnosis not present

## 2022-03-31 NOTE — Telephone Encounter (Signed)
Spoke with pt, he stated he receives his Trulicity through patient assistance program. Gave pt contact number for [employer] pt assistance and advised him to call them to make sure they are not waiting for information from him to send out his refills. Pt voiced understanding.

## 2022-03-31 NOTE — Telephone Encounter (Signed)
Patient states Dr Dorris Fetch had the company of [employer] send him the RX. He said he is out and was not sure if he needed to call them or if that is something we would handle. Please Advise

## 2022-04-04 DIAGNOSIS — G4733 Obstructive sleep apnea (adult) (pediatric): Secondary | ICD-10-CM | POA: Diagnosis not present

## 2022-04-04 DIAGNOSIS — G2581 Restless legs syndrome: Secondary | ICD-10-CM | POA: Diagnosis not present

## 2022-04-04 DIAGNOSIS — J449 Chronic obstructive pulmonary disease, unspecified: Secondary | ICD-10-CM | POA: Diagnosis not present

## 2022-04-04 DIAGNOSIS — J9611 Chronic respiratory failure with hypoxia: Secondary | ICD-10-CM | POA: Diagnosis not present

## 2022-04-08 ENCOUNTER — Telehealth: Payer: Self-pay | Admitting: Pulmonary Disease

## 2022-04-08 NOTE — Telephone Encounter (Signed)
Patient states that his PCP called him when he was walking through his house and wanted the patient to let Dr. Elsworth Soho know that he was SOB while walking. Patient states he is not more SOB than usual. Offered to send a message regarding SOB to DOD since Dr. Elsworth Soho is out of office for the next week but patient states he would rather wait until Dr. Elsworth Soho returns to let him know of situation since he is not more SOB than usual and does not feel the need to seek immediate care.   Dr. Elsworth Soho routing to you as an Juluis Rainier

## 2022-04-13 NOTE — Telephone Encounter (Signed)
ATC patient.  Left detailed message on patients cell phone vm with recommendations from Dr. Elsworth Soho. Advised patient to call back for any further concerns with breathing. Nothing further needed.

## 2022-04-23 DIAGNOSIS — E1165 Type 2 diabetes mellitus with hyperglycemia: Secondary | ICD-10-CM | POA: Diagnosis not present

## 2022-04-26 DIAGNOSIS — J449 Chronic obstructive pulmonary disease, unspecified: Secondary | ICD-10-CM | POA: Diagnosis not present

## 2022-04-26 DIAGNOSIS — G4733 Obstructive sleep apnea (adult) (pediatric): Secondary | ICD-10-CM | POA: Diagnosis not present

## 2022-04-26 DIAGNOSIS — E782 Mixed hyperlipidemia: Secondary | ICD-10-CM | POA: Diagnosis not present

## 2022-04-26 DIAGNOSIS — E1159 Type 2 diabetes mellitus with other circulatory complications: Secondary | ICD-10-CM | POA: Diagnosis not present

## 2022-04-27 LAB — COMPREHENSIVE METABOLIC PANEL
ALT: 19 IU/L (ref 0–44)
AST: 21 IU/L (ref 0–40)
Albumin/Globulin Ratio: 1.6 (ref 1.2–2.2)
Albumin: 4.1 g/dL (ref 3.8–4.9)
Alkaline Phosphatase: 58 IU/L (ref 44–121)
BUN/Creatinine Ratio: 9 (ref 9–20)
BUN: 11 mg/dL (ref 6–24)
Bilirubin Total: 0.5 mg/dL (ref 0.0–1.2)
CO2: 23 mmol/L (ref 20–29)
Calcium: 9.5 mg/dL (ref 8.7–10.2)
Chloride: 103 mmol/L (ref 96–106)
Creatinine, Ser: 1.22 mg/dL (ref 0.76–1.27)
Globulin, Total: 2.5 g/dL (ref 1.5–4.5)
Glucose: 108 mg/dL — ABNORMAL HIGH (ref 70–99)
Potassium: 4 mmol/L (ref 3.5–5.2)
Sodium: 142 mmol/L (ref 134–144)
Total Protein: 6.6 g/dL (ref 6.0–8.5)
eGFR: 68 mL/min/{1.73_m2} (ref >59)

## 2022-04-27 LAB — TSH: TSH: 1.4 u[IU]/mL (ref 0.450–4.500)

## 2022-04-27 LAB — LIPID PANEL
Chol/HDL Ratio: 4.7 ratio (ref 0.0–5.0)
Cholesterol, Total: 122 mg/dL (ref 100–199)
HDL: 26 mg/dL — ABNORMAL LOW (ref >39)
LDL Chol Calc (NIH): 62 mg/dL (ref 0–99)
Triglycerides: 206 mg/dL — ABNORMAL HIGH (ref 0–149)
VLDL Cholesterol Cal: 34 mg/dL (ref 5–40)

## 2022-04-27 LAB — T4, FREE: Free T4: 1.5 ng/dL (ref 0.82–1.77)

## 2022-04-30 DIAGNOSIS — E1165 Type 2 diabetes mellitus with hyperglycemia: Secondary | ICD-10-CM | POA: Diagnosis not present

## 2022-04-30 DIAGNOSIS — I10 Essential (primary) hypertension: Secondary | ICD-10-CM | POA: Diagnosis not present

## 2022-04-30 DIAGNOSIS — J449 Chronic obstructive pulmonary disease, unspecified: Secondary | ICD-10-CM | POA: Diagnosis not present

## 2022-05-04 DIAGNOSIS — J9611 Chronic respiratory failure with hypoxia: Secondary | ICD-10-CM | POA: Diagnosis not present

## 2022-05-04 DIAGNOSIS — G4733 Obstructive sleep apnea (adult) (pediatric): Secondary | ICD-10-CM | POA: Diagnosis not present

## 2022-05-04 DIAGNOSIS — G2581 Restless legs syndrome: Secondary | ICD-10-CM | POA: Diagnosis not present

## 2022-05-04 DIAGNOSIS — J449 Chronic obstructive pulmonary disease, unspecified: Secondary | ICD-10-CM | POA: Diagnosis not present

## 2022-05-06 ENCOUNTER — Encounter: Payer: Self-pay | Admitting: "Endocrinology

## 2022-05-06 ENCOUNTER — Ambulatory Visit (INDEPENDENT_AMBULATORY_CARE_PROVIDER_SITE_OTHER): Payer: Medicare HMO | Admitting: "Endocrinology

## 2022-05-06 VITALS — BP 122/58 | HR 88 | Ht 70.0 in | Wt 325.4 lb

## 2022-05-06 DIAGNOSIS — I10 Essential (primary) hypertension: Secondary | ICD-10-CM | POA: Diagnosis not present

## 2022-05-06 DIAGNOSIS — E1159 Type 2 diabetes mellitus with other circulatory complications: Secondary | ICD-10-CM

## 2022-05-06 DIAGNOSIS — F17200 Nicotine dependence, unspecified, uncomplicated: Secondary | ICD-10-CM

## 2022-05-06 DIAGNOSIS — E782 Mixed hyperlipidemia: Secondary | ICD-10-CM

## 2022-05-06 DIAGNOSIS — R69 Illness, unspecified: Secondary | ICD-10-CM | POA: Diagnosis not present

## 2022-05-06 LAB — POCT GLYCOSYLATED HEMOGLOBIN (HGB A1C): HbA1c, POC (controlled diabetic range): 5.6 % (ref 0.0–7.0)

## 2022-05-06 MED ORDER — HUMULIN R U-500 KWIKPEN 500 UNIT/ML ~~LOC~~ SOPN: 50.0000 [IU] | PEN_INJECTOR | Freq: Three times a day (TID) | SUBCUTANEOUS | 2 refills | Status: DC

## 2022-05-06 NOTE — Patient Instructions (Signed)

## 2022-05-06 NOTE — Progress Notes (Signed)
05/06/2022  Endocrinology follow-up note   Subjective:    Patient ID: [name], male    DOB: April 27, 1961.  he is being seen in follow-up  for management of currently uncontrolled symptomatic type 2 diabetes, hypertension, hyperlipidemia, obesity. PMD:  Sharilyn Sites, MD.   Past Medical History:  Diagnosis Date   Abnormal myocardial perfusion study 01/01/2011   there a small to moderate sized inferobasal scar   Barrett's esophagus    Cataracts, bilateral    Chronic kidney disease    hx of kidney stones   Claudication ([place]) 11/16/2011   PV test perform shows normal   COPD (chronic obstructive pulmonary disease) ([place])    Depression    Diabetes ([place])    type 2 diabetes mellitus   Dysrhythmia    GERD (gastroesophageal reflux disease)    Glaucoma    Hernia of abdominal wall    History of radiation therapy    Right lung, SBRT- 04/16/21-04/30/21- Dr. Gery Pray   HTN (hypertension)    Hyperlipidemia    Morbid obesity ([place])    Myocardial infarction ([place]) 2008,2009,2009   OSA (obstructive sleep apnea)    on cpap   PAF (paroxysmal atrial fibrillation) ([place])    S/P colonoscopy 2009   3-4 mm transverse colon erosions likely secondary to  ASA   S/P endoscopy 10/2010   moderate erosive gastritis, Barrett's esophagus 1-2cm   Sleep apnea    SOB (shortness of breath) 11/03/2007   2D Echo EF 50%-55%   Stroke ([place]) 2017   Past Surgical History:  Procedure Laterality Date   BIOPSY N/A 04/29/2015   Procedure: BIOPSY;  Surgeon: Danie Binder, MD;  Location: [facility];  Service: Endoscopy;  Laterality: N/A;   BIOPSY  08/01/2018   Procedure: BIOPSY;  Surgeon: Danie Binder, MD;  Location: [facility];  Service: Endoscopy;;  esophageal   BIOPSY  10/12/2021   Procedure: BIOPSY;  Surgeon: Eloise Harman, DO;  [facility];  Service: Endoscopy;;   CABG X 4  03/2008   CARDIAC CATHETERIZATION  2009   stent placement to the left circumflex a 2.25    CARDIAC  CATHETERIZATION  07/08/2010   CARDIAC CATHETERIZATION N/A 11/02/2019   COLONOSCOPY WITH PROPOFOL N/A 09/27/2017   normal ileum, twenty 4 to 8 mm polyps in the sigmoid colon, descending colon, splenic flexure, transverse colon, ascending colon, cecum.  An additional three 2 to 4 mm polyps in the rectum and the descending colon.  Diverticulosis, internal hemorrhoids.  Surgical pathology found the polyps to be one fragment of hyperplastic polyp and 22 fragments of tubular adenoma..  Recommended repeat colonoscop   COLONOSCOPY WITH PROPOFOL N/A 10/12/2021   Procedure: COLONOSCOPY WITH PROPOFOL;  Surgeon: Eloise Harman, DO;  [facility];  Service: Endoscopy;  Laterality: N/A;  1:30pm   CORONARY ARTERY BYPASS GRAFT  2008   4 vessels   CORONARY STENT PLACEMENT  12/29/12   CORONARY STENT PLACEMENT  12/2012   ESOPHAGEAL DILATION N/A 04/29/2015   Procedure: ESOPHAGEAL DILATION 15 mm, 16 mm;  Surgeon: Danie Binder, MD;  Location: [facility];  Service: Endoscopy;  Laterality: N/A;   ESOPHAGOGASTRODUODENOSCOPY  09/2011   Barrett's esophagus, no dysplasia on biopsies. Distal esophagitis. Status post dilation. Moderate gastritis and duodenitis, but biopsies benign. Next EGD in November 2015 for surveillance of Barrett's esophagus.   ESOPHAGOGASTRODUODENOSCOPY (EGD) WITH PROPOFOL N/A 04/29/2015   SLF: 1. Barretts esophagus 2. Moderate non-erosive gastritis.    ESOPHAGOGASTRODUODENOSCOPY (  EGD) WITH PROPOFOL N/A 08/01/2018   Barrett's, repeat in 5 years. Empiric dilatation, mild gastritis   HERNIA REPAIR     ventral hernia repair   LEFT HEART CATHETERIZATION WITH CORONARY/GRAFT ANGIOGRAM N/A 12/29/2012   Procedure: LEFT HEART CATHETERIZATION WITH Beatrix Fetters;  Surgeon: Troy Sine, MD;  Location: [facility] CATH LAB;  Service: Cardiovascular;  Laterality: N/A;   ORIF FIBULA FRACTURE Right 06/03/2015   Procedure: OPEN REDUCTION INTERNAL FIXATION (ORIF) DISTAL FIBULA  FRACTURE;  Surgeon: Garald Balding, MD;  Location: [facility];  Service: Orthopedics;  Laterality: Right;   PERCUTANEOUS CORONARY STENT INTERVENTION (PCI-S)  12/29/2012   Procedure: PERCUTANEOUS CORONARY STENT INTERVENTION (PCI-S);  Surgeon: Troy Sine, MD;  Location: [facility] CATH LAB;  Service: Cardiovascular;;   POLYPECTOMY  09/27/2017   Procedure: POLYPECTOMY;  Surgeon: Danie Binder, MD;  [facility];  Service: Endoscopy;;  cecal polyp, ascending polyps x6, transverse colon polyps x6, splenic flexure polyps x2, descending colon polyps x6, sigmoid  colon polyp x1, rectal polyp x1    POLYPECTOMY  10/12/2021   Procedure: POLYPECTOMY;  Surgeon: Eloise Harman, DO;  [facility];  Service: Endoscopy;;   SAVORY DILATION  09/06/2011   Procedure: SAVORY DILATION;  Surgeon: Dorothyann Peng, MD;  Location: [facility];  Service: Endoscopy;  Laterality: N/A;  Dilated with 54m   SAVORY DILATION N/A 08/01/2018   Procedure: SAVORY DILATION;  Surgeon: FDanie Binder MD;  [facility];  Service: Endoscopy;  Laterality: N/A;   Social History   Socioeconomic History   Marital status: Divorced    Spouse name: Not on file   Number of children: Not on file   Years of education: Not on file   Highest education level: Not on file  Occupational History   Not on file  Tobacco Use   Smoking status: Every Day    Packs/day: 0.50    Years: 30.00    Total pack years: 15.00    Types: Cigarettes   Smokeless tobacco: Never   Tobacco comments:    smokes 1/2 pack per day 09/15/21  Vaping Use   Vaping Use: Never used  Substance and Sexual Activity   Alcohol use: Yes    Comment: occ beer   Drug use: No   Sexual activity: Never  Other Topics Concern   Not on file  Social History Narrative   Not on file   Social Determinants of Health   Financial Resource Strain: Not on file  Food Insecurity: Not on file  Transportation Needs: Not on file  Physical Activity: Not on file  Stress: Not on file  Social  Connections: Not on file   Outpatient Encounter Medications as of 05/06/2022  Medication Sig   albuterol (PROVENTIL) (2.5 MG/3ML) 0.083% nebulizer solution Take 3 mLs (2.5 mg total) by nebulization every 6 (six) hours as needed for wheezing or shortness of breath.   albuterol (VENTOLIN HFA) 108 (90 Base) MCG/ACT inhaler INHALE TWO PUFFS into THE lungs EVERY 6 HOURS AS NEEDED FOR SHORTNESS OF BREATH OR wheezing   apixaban (ELIQUIS) 5 MG TABS tablet Take 1 tablet (5 mg total) by mouth 2 (two) times daily.   blood glucose meter kit and supplies Dispense based on patient and insurance preference. Use up to two times daily as directed. (FOR ICD-E11.65)   Blood Glucose Monitoring Suppl (ONETOUCH VERIO) w/Device KIT 1 each by Does not apply route as needed.   Continuous Blood Gluc Receiver (FREESTYLE LNeshanic  2 READER) DEVI As directed   Continuous Blood Gluc Sensor (FREESTYLE LIBRE 2 SENSOR) MISC 1 Piece by Does not apply route every 14 (fourteen) days.   Cyanocobalamin (VITAMIN B-12 IJ) Inject 1,000 mcg as directed every 30 (thirty) days.   diazepam (VALIUM) 5 MG tablet Take 5 mg by mouth 2 (two) times daily.   Dulaglutide 1.5 MG/0.5ML SOPN Inject 1.5 mg into the skin once a week.   escitalopram (LEXAPRO) 20 MG tablet Take 20 mg by mouth in the morning.   fenofibrate (TRICOR) 145 MG tablet Take 145 mg by mouth in the morning.   Fluticasone-Umeclidin-Vilant 100-62.5-25 MCG/INH AEPB Inhale 1 puff into the lungs daily. Trelegy   furosemide (LASIX) 80 MG tablet Take 80 mg by mouth in the morning.   insulin regular human CONCENTRATED (HUMULIN R U-500 KWIKPEN) 500 UNIT/ML KwikPen Inject 50 Units into the skin 3 (three) times daily with meals. Only when blood glucose is above 90 and eating.   isosorbide mononitrate (IMDUR) 120 MG 24 hr tablet Take 120 mg by mouth in the morning.   Lancets (ONETOUCH DELICA PLUS [id number]) MISC USE TO check blood sugar FOUR TIMES DAILY AS DIRECTED   lisinopril (ZESTRIL) 10 MG  tablet Take 1 tablet (10 mg total) by mouth daily.   meclizine (ANTIVERT) 25 MG tablet Take 25 mg by mouth every 6 (six) hours as needed for dizziness or nausea.   metoprolol tartrate (LOPRESSOR) 100 MG tablet Take 1 tablet (100 mg total) by mouth 2 (two) times daily. OFFICE VISIT NEEDED BEFORE ADDITIONAL REFILLS   modafinil (PROVIGIL) 200 MG tablet Take 400 mg by mouth in the morning.   nitroGLYCERIN (NITROLINGUAL) 0.4 MG/SPRAY spray Place 1 spray under the tongue every 5 (five) minutes x 3 doses as needed for chest pain.   ONETOUCH VERIO test strip USE TO check blood sugar FOUR TIMES DAILY AS DIRECTED   pantoprazole (PROTONIX) 40 MG tablet TAKE ONE TABLET BY MOUTH TWICE DAILY BEFORE A meal   polyethylene glycol-electrolytes (TRILYTE) 420 g solution Take 4,000 mLs by mouth as directed.   potassium chloride SA (K-DUR,KLOR-CON) 20 MEQ tablet Take 1 tablet (20 mEq total) by mouth 2 (two) times daily.   rosuvastatin (CRESTOR) 20 MG tablet Take 20 mg by mouth in the morning.   TRUEPLUS PEN NEEDLES 31G X 8 MM MISC USE AS DIRECTED TWICE DAILY   Vitamin D, Ergocalciferol, (DRISDOL) 1.25 MG (50000 UNIT) CAPS capsule Take 1 capsule (50,000 Units total) by mouth every 7 (seven) days. (Patient taking differently: Take 50,000 Units by mouth every Friday.)   [DISCONTINUED] amLODipine (NORVASC) 10 MG tablet Take 10 mg by mouth in the morning.   [DISCONTINUED] glipiZIDE (GLUCOTROL XL) 5 MG 24 hr tablet TAKE ONE TABLET BY MOUTH EVERY MORNING WITH BREAKFAST   [DISCONTINUED] insulin regular human CONCENTRATED (HUMULIN R U-500 KWIKPEN) 500 UNIT/ML kwikpen Inject 70 Units into the skin 3 (three) times daily with meals. Only when blood glucose is above 90 and eating.   No facility-administered encounter medications on file as of 05/06/2022.    ALLERGIES: Allergies  Allergen Reactions   Contrast Media [Iodinated Contrast Media] Other (See Comments)    Pt must be premedicated before given contrast media - stops heart    Iohexol Other (See Comments)     Consult with radiologist before pre meds are given.Desc: PT. STATES HEART STOPPED HAS TO BE PREMED.     VACCINATION STATUS: Immunization History  Administered Date(s) Administered   Influenza,inj,Quad PF,6+ Mos 09/22/2020  Diabetes He presents for his follow-up diabetic visit. He has type 2 diabetes mellitus. Onset time: He was diagnosed at approximate age of 89 years. His disease course has been improving. There are no hypoglycemic associated symptoms. Pertinent negatives for hypoglycemia include no confusion, headaches, pallor or seizures. Pertinent negatives for diabetes include no blurred vision, no chest pain, no fatigue, no polydipsia, no polyphagia, no polyuria and no weakness. There are no hypoglycemic complications. Symptoms are improving. Diabetic complications include a CVA and heart disease. Risk factors for coronary artery disease include diabetes mellitus, dyslipidemia, hypertension, male sex, obesity, tobacco exposure, sedentary lifestyle and family history. Current diabetic treatments: He was supposed to be on basal insulin, unfortunately he ran out and could not afford refills and he did not call clinic for alternatives. He is compliant with treatment none of the time. His weight is fluctuating minimally. He is following a generally unhealthy diet. When asked about meal planning, he reported none. He has not had a previous visit with a dietitian. He never participates in exercise. His home blood glucose trend is decreasing steadily. His breakfast blood glucose range is generally 110-130 mg/dl. His lunch blood glucose range is generally 110-130 mg/dl. His dinner blood glucose range is generally 130-140 mg/dl. His bedtime blood glucose range is generally 130-140 mg/dl. His overall blood glucose range is 130-140 mg/dl. [place] presents with his CGM.  He is CGM was downloaded and advised.  AGP report shows continued improvement in his glycemic profile.   He is 73% time in range, 90% daily 1 hypoglycemia, 16% daily 1 hypoglycemia.  His point-of-care A1c is 5.6%, significantly improving progressively from 11%.      ) An ACE inhibitor/angiotensin II receptor blocker is being taken. He does not see a podiatrist.Eye exam is not current.  Hyperlipidemia This is a chronic problem. The current episode started more than 1 year ago. The problem is uncontrolled. Recent lipid tests were reviewed and are high. Exacerbating diseases include diabetes and obesity. Pertinent negatives include no chest pain, myalgias or shortness of breath. Current antihyperlipidemic treatment includes statins. Compliance problems include medication cost and psychosocial issues.  Risk factors for coronary artery disease include dyslipidemia, diabetes mellitus, hypertension, male sex, obesity, family history and a sedentary lifestyle.  Hypertension This is a chronic problem. The current episode started more than 1 year ago. The problem is uncontrolled. Pertinent negatives include no blurred vision, chest pain, headaches, neck pain, palpitations or shortness of breath. Risk factors for coronary artery disease include diabetes mellitus, dyslipidemia, male gender, obesity, family history, sedentary lifestyle and smoking/tobacco exposure. Past treatments include ACE inhibitors. Hypertensive end-organ damage includes CVA.     Review of systems  Constitutional: + Minimally fluctuating body weight,  current  Body mass index is 46.69 kg/m. , no fatigue, no subjective hyperthermia, no subjective hypothermia    Objective:    BP (!) 122/58   Pulse 88   Ht '5\' 10"'  (1.778 m)   Wt (!) 325 lb 6.4 oz (147.6 kg)   BMI 46.69 kg/m   Wt Readings from Last 3 Encounters:  05/06/22 (!) 325 lb 6.4 oz (147.6 kg)  03/04/22 (!) 323 lb 9.6 oz (146.8 kg)  01/04/22 (!) 319 lb 6.4 oz (144.9 kg)      Physical Exam- Limited  Constitutional:  Body mass index is 46.69 kg/m. , not in acute distress,  normal state of mind    CMP ( most recent) CMP     Component Value Date/Time   NA 142  04/26/2022 1353   K 4.0 04/26/2022 1353   CL 103 04/26/2022 1353   CO2 23 04/26/2022 1353   GLUCOSE 108 (H) 04/26/2022 1353   GLUCOSE 209 (H) 01/30/2019 1320   BUN 11 04/26/2022 1353   CREATININE 1.22 04/26/2022 1353   CALCIUM 9.5 04/26/2022 1353   PROT 6.6 04/26/2022 1353   ALBUMIN 4.1 04/26/2022 1353   AST 21 04/26/2022 1353   ALT 19 04/26/2022 1353   ALKPHOS 58 04/26/2022 1353   BILITOT 0.5 04/26/2022 1353   GFRNONAA 80 12/04/2020 0000   GFRAA 93 12/04/2020 0000   Diabetic Labs (most recent): Lab Results  Component Value Date   HGBA1C 5.6 05/06/2022   HGBA1C 7.0 01/04/2022   HGBA1C 7.5 (A) 10/06/2021   MICROALBUR 10 06/26/2020     Lipid Panel ( most recent) Lipid Panel     Component Value Date/Time   CHOL 122 04/26/2022 1353   TRIG 206 (H) 04/26/2022 1353   HDL 26 (L) 04/26/2022 1353   CHOLHDL 4.7 04/26/2022 1353   CHOLHDL 6.1 04/28/2014 0121   VLDL 61 (H) 04/28/2014 0121   LDLCALC 62 04/26/2022 1353     Assessment & Plan:   1. DM type 2 causing vascular disease ([place])  - Patient has currently uncontrolled symptomatic type 2 DM since  61 years of age.  [place] presents with his CGM.  He is CGM was downloaded and advised.  AGP report shows continued improvement in his glycemic profile.  He is 73% time in range, 90% daily 1 hypoglycemia, 16% daily 1 hypoglycemia.  His point-of-care A1c is 5.6%, significantly improving progressively from 11%.    -his diabetes is complicated by coronary artery disease, CVA, obesity/sedentary life, chronic heavy smoking and RILEE WENDLING remains at extremely high risk  for more acute and chronic complications which include CAD, CVA, CKD, retinopathy, and neuropathy. These are all discussed in detail with the patient.  - I have counseled him on diet management and weight loss, by adopting a carbohydrate restricted/protein rich diet.   -  he acknowledges that there is a room for improvement in his food and drink choices. - Suggestion is made for him to avoid simple carbohydrates  from his diet including Cakes, Sweet Desserts, Ice Cream, Soda (diet and regular), Sweet Tea, Candies, Chips, Cookies, Store Bought Juices, Alcohol in Excess of  1-2 drinks a day, Artificial Sweeteners,  Coffee Creamer, and "Sugar-free" Products, Lemonade. This will help patient to have more stable blood glucose profile and potentially avoid unintended weight gain. - I encouraged him to switch to  unprocessed or minimally processed complex starch and increased protein intake (animal or plant source), fruits, and vegetables.  Whole food, plant-based diet was discussed in detail with him.  - he is advised to stick to a routine mealtimes to eat 3 meals  a day and avoid unnecessary snacks ( to snack only to correct hypoglycemia).   - I have approached him with the following individualized plan to manage diabetes and patient agrees:   - He continues to respond and benefit from  the U500  insulin favorably.   -Priority is still to avoid hypoglycemia in this patient.    -He is advised to lower his U500  to 50 units 3 times a day for [place] blood glucose readings above 90 mg per DL,  associated with strict monitoring of blood glucose 4 times a day-before meals and at bedtime. -He is benefiting from the CGM device.  He is advised to  continue to wear his CGM at all times.    -He will be taken off of glipizide after he finishes his current supply.    - He does not tolerate metformin due to GI side effects.  -He has benefited from low-dose Trulicity.  His Trulicity was increased to 1.5 mg subcutaneously weekly in the interim.  He is advised to continue.   -Patient is encouraged to call clinic for blood glucose levels less than 70 or above 200 mg /dl.   - Patient specific target  A1c;  LDL, HDL, Triglycerides,  were discussed in detail.  2) BP/HTN:  -Blood  pressure is controlled to target. The patient was counseled on the dangers of tobacco use, and was advised to quit.  Reviewed strategies to maximize success, including removing cigarettes and smoking materials from environment.  He is currently on lisinopril 10 mg p.o. daily.    3) Lipids/HPL: He has had history of severe hypertriglyceridemia, recently improving to 230 from 469, mainly associated with improvement in his glycemic profile.  LDL is controlled at 61.  He is advised to continue Trilipix 135 mg p.o. nightly, continue Crestor 20 mg p.o. nightly, fenofibrate 145 mg p.o. nightly.       4)  Weight/Diet: His BMI is 82.9-  -clearly complicating his diabetes care.  He is a candidate for modest weight loss.  CDE Consult has been  initiated , exercise, and detailed carbohydrates information provided.  5) vitamin D deficiency: He is on ongoing supplement with vitamin D2 50,000 units weekly for 12 weeks.  6) Chronic Care/Health Maintenance:  -he  is on ACEI/ARB and Statin medications and  is encouraged to continue to follow up with Ophthalmology, Dentist,  Podiatrist at least yearly or according to recommendations, and advised to  quit smoking. I have recommended yearly flu vaccine and pneumonia vaccination at least every 5 years; moderate intensity exercise for up to 150 minutes weekly; and  sleep for at least 7 hours a day. The patient was counseled on the dangers of tobacco use, and was advised to quit.  Reviewed strategies to maximize success, including removing cigarettes and smoking materials from environment.  - I advised patient to maintain close follow up with Sharilyn Sites, MD for primary care needs.   I spent 41 minutes in the care of the patient today including review of labs from [place], Lipids, Thyroid Function, Hematology (current and previous including abstractions from other facilities); face-to-face time discussing  his blood glucose readings/logs, discussing hypoglycemia and  hyperglycemia episodes and symptoms, medications doses, his options of short and long term treatment based on the latest standards of care / guidelines;  discussion about incorporating lifestyle medicine;  and documenting the encounter. Risk reduction counseling performed per USPSTF guidelines to reduce obesity and cardiovascular risk factors.     Please refer to Patient Instructions for Blood Glucose Monitoring and Insulin/Medications Dosing Guide"  in media tab for additional information. Please  also refer to " Patient Self Inventory" in the Media  tab for reviewed elements of pertinent patient history.  [name] participated in the discussions, expressed understanding, and voiced agreement with the above plans.  All questions were answered to his satisfaction. he is encouraged to contact clinic should he have any questions or concerns prior to his return visit.     Follow up plan: - Return in about 3 months (around 08/06/2022) for Bring Meter/CGM Device/Logs- A1c in Office.   Glade Lloyd, MD Phone: [phone]  Fax: [phone]  05/06/2022, 4:30 PM   This note was partially dictated with voice recognition software. Similar sounding words can be transcribed inadequately or may not  be corrected upon review.

## 2022-05-10 ENCOUNTER — Telehealth: Payer: Self-pay | Admitting: "Endocrinology

## 2022-05-10 NOTE — Telephone Encounter (Signed)
Confirmed with [name] at [facility] that pt is to discontinue his glipizide once he finishes the supply he has at home. Understanding voiced.

## 2022-05-10 NOTE — Telephone Encounter (Signed)
[name] with [facility] is needing a call back confirming the patient Glipizide was discontinued. [phone]

## 2022-05-14 ENCOUNTER — Encounter: Payer: Self-pay | Admitting: Nurse Practitioner

## 2022-05-14 ENCOUNTER — Telehealth: Payer: Self-pay | Admitting: Nurse Practitioner

## 2022-05-14 ENCOUNTER — Ambulatory Visit (INDEPENDENT_AMBULATORY_CARE_PROVIDER_SITE_OTHER): Payer: Medicare HMO | Admitting: Nurse Practitioner

## 2022-05-14 ENCOUNTER — Ambulatory Visit (INDEPENDENT_AMBULATORY_CARE_PROVIDER_SITE_OTHER): Payer: Medicare HMO

## 2022-05-14 VITALS — BP 112/62 | HR 84 | Temp 98.7°F | Ht 70.0 in | Wt 324.2 lb

## 2022-05-14 DIAGNOSIS — S2220XA Unspecified fracture of sternum, initial encounter for closed fracture: Secondary | ICD-10-CM | POA: Diagnosis not present

## 2022-05-14 DIAGNOSIS — J441 Chronic obstructive pulmonary disease with (acute) exacerbation: Secondary | ICD-10-CM

## 2022-05-14 DIAGNOSIS — R918 Other nonspecific abnormal finding of lung field: Secondary | ICD-10-CM | POA: Diagnosis not present

## 2022-05-14 DIAGNOSIS — C3411 Malignant neoplasm of upper lobe, right bronchus or lung: Secondary | ICD-10-CM

## 2022-05-14 DIAGNOSIS — R0602 Shortness of breath: Secondary | ICD-10-CM | POA: Diagnosis not present

## 2022-05-14 DIAGNOSIS — R911 Solitary pulmonary nodule: Secondary | ICD-10-CM

## 2022-05-14 MED ORDER — PREDNISONE 10 MG PO TABS: ORAL_TABLET | ORAL | 0 refills | Status: DC

## 2022-05-14 NOTE — Assessment & Plan Note (Signed)
Progression of disease versus acute exacerbation versus recurrence of malignancy.  Nontoxic and vital signs were stable.  He was initially thought to be hypotensive upon CMA work-up.  BP was rechecked by provider and was 112/62 with large cuff in right arm.  He did get short of breath with walking oximetry.  Provided him with ED precautions and advised to monitor his oxygen levels at home.  Goal greater than 88-90%. Given his lung exam, we will treat him with prednisone taper.  He has a minimal nonproductive cough, relatively unchanged from baseline, and no other infectious symptoms so think we can hold off on antibiotics at this point.  CXR is concerning for recurrence of malignancy.  We have ordered a CT chest for further evaluation.  Advised that he continue triple therapy with Trelegy and as needed albuterol.  Can use Mucinex for chest congestion.  Patient Instructions  Continue Trelegy 1 puff daily. Brush tongue and rinse mouth afterwards  Continue Albuterol inhaler 2 puffs or 3 mL neb every 6 hours as needed for shortness of breath or wheezing. Notify if symptoms persist despite rescue inhaler/neb use.  Prednisone taper. 4 tabs for 2 days, then 3 tabs for 2 days, 2 tabs for 2 days, then 1 tab for 2 days, then stop. Take in AM with food. Monitor blood sugar while take. If persistently above 350-500 despite insulin, go to the ED. Mucinex 600 mg Twice daily for chest congestion  CT chest - someone will contact you for scheduling  Follow up in one week with Dr. Elsworth Soho or Alanson Aly. If symptoms do not improve or worsen, please contact office for sooner follow up or seek emergency care.

## 2022-05-14 NOTE — Patient Instructions (Addendum)
Continue Trelegy 1 puff daily. Brush tongue and rinse mouth afterwards  Continue Albuterol inhaler 2 puffs or 3 mL neb every 6 hours as needed for shortness of breath or wheezing. Notify if symptoms persist despite rescue inhaler/neb use.  Prednisone taper. 4 tabs for 2 days, then 3 tabs for 2 days, 2 tabs for 2 days, then 1 tab for 2 days, then stop. Take in AM with food. Monitor blood sugar while take. If persistently above 350-500 despite insulin, go to the ED. Mucinex 600 mg Twice daily for chest congestion  CT chest - someone will contact you for scheduling  Follow up in one week with Dr. Elsworth Soho or Alanson Aly. If symptoms do not improve or worsen, please contact office for sooner follow up or seek emergency care.

## 2022-05-14 NOTE — Assessment & Plan Note (Addendum)
S/p SBRT to RUL nodule in 04/2021. CT from Nov stable. CXR today showed a 2.8 cm nodular opacity in RUL with subtle surrounding ill defined opacity. Given his progressive dyspnea and symptoms, highly suspicious for recurrence of disease. CT chest ordered for further evaluation.

## 2022-05-14 NOTE — Progress Notes (Signed)
'@Patient'  ID: [name], male    DOB: [birth year], 61 y.o.   MRN: [id number]  Chief Complaint  Patient presents with   Follow-up    Patient is here to talk about his shortness of breath.     Referring provider: Sharilyn Sites, MD  HPI: 61 year old male, active smoker followed for COPD, OSA on CPAP, right upper lobe lung cancer.  He is a patient of Dr. Bari Mantis and last seen in office 03/04/2022.  Past medical history significant for hypertension, history of MI, CAD, PAF on Eliquis, CHF, allergic rhinitis, GERD, history of CVA, CKD, HLD, obesity, RLS, depression.  TEST/EVENTS:  11/2007 NPSG: Mild OSA 5.7/h 07/2015 NPSG AHI 16/h 06/2016 CPAP titration: 297 pounds, no O2 required; optimal CPAP pressure 8 cm 12/2018 CT chest: Advanced changes of emphysema, multiple noncalcified nodules unchanged when compared to 09/2018, largest right upper lobe 9 mm which is slightly increased when compared to 2018 06/2020 CT chest: RUL GG 12x10 mm nodule 06/2020 PFTs: moderate airway obstruction with ratio of 70, FEV1 51%, FVC 55%, no BD, DLCO 58% 09/2020 PET: SUV 2.1 03/2021 CT chest: subsolid 1.5x1.5 cm nodule RUL increasing in size and density 09/2021 CT chest: decreased size of RUL nodule to 7 mm; other nodules stable.   03/04/2022: OV with Dr. Elsworth Soho.  Breathing is at his baseline.  He is compliant with Trelegy.  Not required albuterol inhaler or nebs much recently.  Doing okay on CPAP.  He does feel like his mask may be too small and has a large leak.  Unable to obtain download on his CPAP.  We will provide him with prescription for larger mask.  Compliant by report.  Caution against driving drowsy. CT chest from November was stable.  Plan for 1 year follow-up in November 2023.  He was counseled on smoking cessation.  05/14/2022: Today - acute Patient presents today for acute visit.  He feels as though over the last few months he has had a progressive decline of his breathing.  Feels like he gets winded even  with minimal activity.  He is limited on what all he can do because of it.  Feels like even simple house chores are difficult.  He does have some occasional wheezing as well.  He has an occasional cough, which is unchanged from baseline and nonproductive.  Denies any weight loss, anorexia, hemoptysis, lower extremity edema, fevers, night sweats.  He is on Trelegy daily, which she would like to remain on as he has a lot of samples at home.  He has not had to use his albuterol more and is using it a few times a day, especially over the last few weeks.  He is also feeling just a little more fatigued.  Does not feel like he is having any issues with the CPAP and wears it nightly.  Denies any drowsy driving.  Allergies  Allergen Reactions   Contrast Media [Iodinated Contrast Media] Other (See Comments)    Pt must be premedicated before given contrast media - stops heart   Iohexol Other (See Comments)     Consult with radiologist before pre meds are given.Desc: PT. STATES HEART STOPPED HAS TO BE PREMED.     Immunization History  Administered Date(s) Administered   Influenza,inj,Quad PF,6+ Mos 09/22/2020    Past Medical History:  Diagnosis Date   Abnormal myocardial perfusion study 01/01/2011   there a small to moderate sized inferobasal scar   Barrett's esophagus    Cataracts, bilateral  Chronic kidney disease    hx of kidney stones   Claudication ([place]) 11/16/2011   PV test perform shows normal   COPD (chronic obstructive pulmonary disease) (HCC)    Depression    Diabetes ([place])    type 2 diabetes mellitus   Dysrhythmia    GERD (gastroesophageal reflux disease)    Glaucoma    Hernia of abdominal wall    History of radiation therapy    Right lung, SBRT- 04/16/21-04/30/21- Dr. Gery Pray   HTN (hypertension)    Hyperlipidemia    Morbid obesity ([place])    Myocardial infarction ([place]) 2008,2009,2009   OSA (obstructive sleep apnea)    on cpap   PAF (paroxysmal atrial fibrillation)  ([place])    S/P colonoscopy 2009   3-4 mm transverse colon erosions likely secondary to  ASA   S/P endoscopy 10/2010   moderate erosive gastritis, Barrett's esophagus 1-2cm   Sleep apnea    SOB (shortness of breath) 11/03/2007   2D Echo EF 50%-55%   Stroke ([place]) 2017    Tobacco History: Social History   Tobacco Use  Smoking Status Every Day   Packs/day: 0.50   Years: 30.00   Total pack years: 15.00   Types: Cigarettes  Smokeless Tobacco Never  Tobacco Comments   smokes 1/2 pack per day 09/15/21   Ready to quit: Not Answered Counseling given: Not Answered Tobacco comments: smokes 1/2 pack per day 09/15/21   Outpatient Medications Prior to Visit  Medication Sig Dispense Refill   albuterol (VENTOLIN HFA) 108 (90 Base) MCG/ACT inhaler INHALE TWO PUFFS into THE lungs EVERY 6 HOURS AS NEEDED FOR SHORTNESS OF BREATH OR wheezing 8.5 g 3   apixaban (ELIQUIS) 5 MG TABS tablet Take 1 tablet (5 mg total) by mouth 2 (two) times daily. 1 tablet 0   blood glucose meter kit and supplies Dispense based on patient and insurance preference. Use up to two times daily as directed. (FOR ICD-E11.65) 1 each 5   Blood Glucose Monitoring Suppl (ONETOUCH VERIO) w/Device KIT 1 each by Does not apply route as needed. 1 kit 0   Continuous Blood Gluc Receiver (FREESTYLE LIBRE 2 READER) DEVI As directed 1 each 0   Continuous Blood Gluc Sensor (FREESTYLE LIBRE 2 SENSOR) MISC 1 Piece by Does not apply route every 14 (fourteen) days. 2 each 3   Cyanocobalamin (VITAMIN B-12 IJ) Inject 1,000 mcg as directed every 30 (thirty) days.     diazepam (VALIUM) 5 MG tablet Take 5 mg by mouth 2 (two) times daily.     Dulaglutide 1.5 MG/0.5ML SOPN Inject 1.5 mg into the skin once a week.     escitalopram (LEXAPRO) 20 MG tablet Take 20 mg by mouth in the morning.     fenofibrate (TRICOR) 145 MG tablet Take 145 mg by mouth in the morning.     Fluticasone-Umeclidin-Vilant 100-62.5-25 MCG/INH AEPB Inhale 1 puff into the lungs  daily. Trelegy     furosemide (LASIX) 80 MG tablet Take 80 mg by mouth in the morning.  4   insulin regular human CONCENTRATED (HUMULIN R U-500 KWIKPEN) 500 UNIT/ML KwikPen Inject 50 Units into the skin 3 (three) times daily with meals. Only when blood glucose is above 90 and eating. 12 mL 2   isosorbide mononitrate (IMDUR) 120 MG 24 hr tablet Take 120 mg by mouth in the morning.     Lancets (ONETOUCH DELICA PLUS [id number]) MISC USE TO check blood sugar FOUR TIMES DAILY AS DIRECTED 200 each 1  lisinopril (ZESTRIL) 10 MG tablet Take 1 tablet (10 mg total) by mouth daily. 90 tablet 1   meclizine (ANTIVERT) 25 MG tablet Take 25 mg by mouth every 6 (six) hours as needed for dizziness or nausea.     metoprolol tartrate (LOPRESSOR) 100 MG tablet Take 1 tablet (100 mg total) by mouth 2 (two) times daily. OFFICE VISIT NEEDED BEFORE ADDITIONAL REFILLS 28 tablet 0   modafinil (PROVIGIL) 200 MG tablet Take 400 mg by mouth in the morning.     nitroGLYCERIN (NITROLINGUAL) 0.4 MG/SPRAY spray Place 1 spray under the tongue every 5 (five) minutes x 3 doses as needed for chest pain. 12 g 0   ONETOUCH VERIO test strip USE TO check blood sugar FOUR TIMES DAILY AS DIRECTED 200 strip 1   pantoprazole (PROTONIX) 40 MG tablet TAKE ONE TABLET BY MOUTH TWICE DAILY BEFORE A meal 180 tablet 3   polyethylene glycol-electrolytes (TRILYTE) 420 g solution Take 4,000 mLs by mouth as directed. 4000 mL 0   potassium chloride SA (K-DUR,KLOR-CON) 20 MEQ tablet Take 1 tablet (20 mEq total) by mouth 2 (two) times daily. 30 tablet 0   rosuvastatin (CRESTOR) 20 MG tablet Take 20 mg by mouth in the morning.     TRUEPLUS PEN NEEDLES 31G X 8 MM MISC USE AS DIRECTED TWICE DAILY 100 each 3   Vitamin D, Ergocalciferol, (DRISDOL) 1.25 MG (50000 UNIT) CAPS capsule Take 1 capsule (50,000 Units total) by mouth every 7 (seven) days. (Patient taking differently: Take 50,000 Units by mouth every Friday.) 12 capsule 0   albuterol (PROVENTIL) (2.5  MG/3ML) 0.083% nebulizer solution Take 3 mLs (2.5 mg total) by nebulization every 6 (six) hours as needed for wheezing or shortness of breath. 75 mL 12   No facility-administered medications prior to visit.     Review of Systems:   Constitutional: No weight loss or gain, night sweats, fevers, chills. +fatigue HEENT: No headaches, difficulty swallowing, tooth/dental problems, or sore throat. No sneezing, itching, ear ache, nasal congestion, or post nasal drip CV:  No chest pain, orthopnea, PND, swelling in lower extremities, anasarca, dizziness, palpitations, syncope Resp: +shortness of breath with exertion; occasional nonproductive cough; wheezing.. No excess mucus or change in color of mucus.  No hemoptysis. No chest wall deformity GI:  No heartburn, indigestion, abdominal pain, nausea, vomiting, diarrhea, change in bowel habits, loss of appetite, bloody stools.  Skin: No rash, lesions, ulcerations MSK:  No joint pain or swelling.  No decreased range of motion.  No back pain. Neuro: No dizziness or lightheadedness.  Psych: No depression or anxiety. Mood stable.     Physical Exam:  BP 112/62 Comment: Rechecked by provider in R arm  Pulse 84   Temp 98.7 F (37.1 C) (Oral)   Ht '5\' 10"'  (1.778 m)   Wt (!) 324 lb 3.2 oz (147.1 kg)   SpO2 92%   BMI 46.52 kg/m   GEN: Pleasant, interactive, chronically-ill appearing; morbidly obese; in no acute distress. HEENT:  Normocephalic and atraumatic. PERRLA. Sclera white. Nasal turbinates pink, moist and patent bilaterally. No rhinorrhea present. Oropharynx pink and moist, without exudate or edema. No lesions, ulcerations, or postnasal drip.  NECK:  Supple w/ fair ROM. No JVD present. Normal carotid impulses w/o bruits. Thyroid symmetrical with no goiter or nodules palpated. No lymphadenopathy.   CV: RRR, no m/r/g, no peripheral edema. Pulses intact, +2 bilaterally. No cyanosis, pallor or clubbing. PULMONARY:  Unlabored, regular breathing at rest.   Became short of breath  with walking oximetry but quickly recovered after resting; no desaturation on room air.  Scattered rhonchi bilaterally A&P. No accessory muscle use. No dullness to percussion. GI: BS present and normoactive. Soft, non-tender to palpation. No organomegaly or masses detected. No CVA tenderness. MSK: No erythema, warmth or tenderness. Cap refil <2 sec all extrem. No deformities or joint swelling noted.  Neuro: A/Ox3. No focal deficits noted.   Skin: Warm, no lesions or rashe Psych: Normal affect and behavior. Judgement and thought content appropriate.     Lab Results:  CBC    Component Value Date/Time   WBC 11.8 (H) 01/30/2019 1320   RBC 4.95 01/30/2019 1320   HGB 15.9 01/30/2019 1320   HCT 45.9 01/30/2019 1320   PLT 235 01/30/2019 1320   MCV 92.7 01/30/2019 1320   MCH 32.1 01/30/2019 1320   MCHC 34.6 01/30/2019 1320   RDW 12.2 01/30/2019 1320   LYMPHSABS 2.8 06/02/2015 1526   MONOABS 1.0 06/02/2015 1526   EOSABS 0.6 06/02/2015 1526   BASOSABS 0.1 06/02/2015 1526    BMET    Component Value Date/Time   NA 142 04/26/2022 1353   K 4.0 04/26/2022 1353   CL 103 04/26/2022 1353   CO2 23 04/26/2022 1353   GLUCOSE 108 (H) 04/26/2022 1353   GLUCOSE 209 (H) 01/30/2019 1320   BUN 11 04/26/2022 1353   CREATININE 1.22 04/26/2022 1353   CALCIUM 9.5 04/26/2022 1353   GFRNONAA 80 12/04/2020 0000   GFRAA 93 12/04/2020 0000    BNP    Component Value Date/Time   BNP 18.0 11/27/2017 1511     Imaging:  DG Chest 2 View  Result Date: 05/14/2022 CLINICAL DATA:  Provided history: Increased shortness of breath. Acute exacerbation of COPD versus fluid overload. EXAM: CHEST - 2 VIEW COMPARISON:  CT chest 09/08/2021. Prior chest radiographs 04/01/2019 and earlier. FINDINGS: Prior median sternotomy. As before, there are multiple fractures within the sternal cerclage wires. Heart size within normal limits. A 2.8 cm nodular opacity projects in the region of the right  upper lobe on the PA radiograph. There is also subtle surrounding ill-defined opacity. No appreciable airspace consolidation within the left lung. No evidence of pleural effusion or pneumothorax. No acute bony abnormality identified. Degenerative changes of the spine. IMPRESSION: Apparent 2.8 cm nodule with surrounding ill-defined opacity in the right upper lobe. Differential considerations include pulmonary malignancy, an infectious/inflammatory process or scarring, among others. A chest CT is recommended for further evaluation Electronically Signed   By: Kellie Simmering D.O.   On: 05/14/2022 16:20         Latest Ref Rng & Units 03/20/2020    3:38 PM  PFT Results  FVC-Pre L 2.70  P  FVC-Predicted Pre % 55  P  FVC-Post L 2.75  P  FVC-Predicted Post % 56  P  Pre FEV1/FVC % % 70  P  Post FEV1/FCV % % 74  P  FEV1-Pre L 1.90  P  FEV1-Predicted Pre % 51  P  FEV1-Post L 2.04  P  DLCO uncorrected ml/min/mmHg 16.47  P  DLCO UNC% % 58  P  DLCO corrected ml/min/mmHg 16.47  P  DLCO COR %Predicted % 58  P  DLVA Predicted % 85  P  TLC L 5.35  P  TLC % Predicted % 76  P  RV % Predicted % 112  P    P Preliminary result    No results found for: "NITRICOXIDE"      Assessment &  Plan:   COPD with acute exacerbation ([place]) Progression of disease versus acute exacerbation versus recurrence of malignancy.  Nontoxic and vital signs were stable.  He was initially thought to be hypotensive upon CMA work-up.  BP was rechecked by provider and was 112/62 with large cuff in right arm.  He did get short of breath with walking oximetry.  Provided him with ED precautions and advised to monitor his oxygen levels at home.  Goal greater than 88-90%. Given his lung exam, we will treat him with prednisone taper.  He has a minimal nonproductive cough, relatively unchanged from baseline, and no other infectious symptoms so think we can hold off on antibiotics at this point.  CXR is concerning for recurrence of malignancy.   We have ordered a CT chest for further evaluation.  Advised that he continue triple therapy with Trelegy and as needed albuterol.  Can use Mucinex for chest congestion.  Patient Instructions  Continue Trelegy 1 puff daily. Brush tongue and rinse mouth afterwards  Continue Albuterol inhaler 2 puffs or 3 mL neb every 6 hours as needed for shortness of breath or wheezing. Notify if symptoms persist despite rescue inhaler/neb use.  Prednisone taper. 4 tabs for 2 days, then 3 tabs for 2 days, 2 tabs for 2 days, then 1 tab for 2 days, then stop. Take in AM with food. Monitor blood sugar while take. If persistently above 350-500 despite insulin, go to the ED. Mucinex 600 mg Twice daily for chest congestion  CT chest - someone will contact you for scheduling  Follow up in one week with Dr. Elsworth Soho or Alanson Aly. If symptoms do not improve or worsen, please contact office for sooner follow up or seek emergency care.     Lung cancer ([place]) S/p SBRT to RUL nodule in 04/2021. CT from Nov stable. CXR today showed a 2.8 cm nodular opacity in RUL with subtle surrounding ill defined opacity. Given his progressive dyspnea and symptoms, highly suspicious for recurrence of disease. CT chest ordered for further evaluation.    I spent 42 minutes of dedicated to the care of this patient on the date of this encounter to include pre-visit review of records, face-to-face time with the patient discussing conditions above, post visit ordering of testing, clinical documentation with the electronic health record, making appropriate referrals as documented, and communicating necessary findings to members of the patients care team.  Clayton Bibles, NP 05/14/2022  Pt aware and understands NP's role.

## 2022-05-17 ENCOUNTER — Ambulatory Visit (HOSPITAL_COMMUNITY)
Admission: RE | Admit: 2022-05-17 | Discharge: 2022-05-17 | Disposition: A | Discharge status: Home / Self Care | Payer: Medicare HMO | Source: Ambulatory Visit | Attending: Nurse Practitioner | Admitting: Nurse Practitioner

## 2022-05-17 ENCOUNTER — Telehealth: Payer: Self-pay | Admitting: "Endocrinology

## 2022-05-17 DIAGNOSIS — R911 Solitary pulmonary nodule: Secondary | ICD-10-CM | POA: Diagnosis not present

## 2022-05-17 DIAGNOSIS — R918 Other nonspecific abnormal finding of lung field: Secondary | ICD-10-CM | POA: Diagnosis not present

## 2022-05-17 NOTE — Telephone Encounter (Signed)
Spoke with pt about his libre sensors. He stated he would bring the sensors he received to the office to check compatibility.

## 2022-05-17 NOTE — Telephone Encounter (Signed)
I called and left patient voicemail at both numbers about appt today

## 2022-05-17 NOTE — Telephone Encounter (Signed)
Pt is requesting a call back about his sensor (he called after he left today)

## 2022-05-18 ENCOUNTER — Telehealth: Payer: Self-pay | Admitting: Nurse Practitioner

## 2022-05-18 NOTE — Telephone Encounter (Signed)
Spoke with patient regarding CT scan. The area that we were concerned about on the chest x ray from Friday in his right lung look to be radiation changes on the CT scan and not recurrence of his cancer, which is good news. I had Dr. Elsworth Soho review the imaging as well and he agrees with the interpretation. He does have a few new small nodules throughout the right lung that we will need to obtain repeat CT chest in 3 months for. Verbalized understanding and all questions answered. Nothing further needed.

## 2022-05-20 DIAGNOSIS — E538 Deficiency of other specified B group vitamins: Secondary | ICD-10-CM | POA: Diagnosis not present

## 2022-05-21 ENCOUNTER — Encounter: Payer: Self-pay | Admitting: Nurse Practitioner

## 2022-05-21 ENCOUNTER — Ambulatory Visit (INDEPENDENT_AMBULATORY_CARE_PROVIDER_SITE_OTHER): Payer: Medicare HMO | Admitting: Nurse Practitioner

## 2022-05-21 VITALS — BP 108/68 | HR 85 | Ht 66.0 in | Wt 319.4 lb

## 2022-05-21 DIAGNOSIS — C3411 Malignant neoplasm of upper lobe, right bronchus or lung: Secondary | ICD-10-CM | POA: Diagnosis not present

## 2022-05-21 DIAGNOSIS — J441 Chronic obstructive pulmonary disease with (acute) exacerbation: Secondary | ICD-10-CM

## 2022-05-21 DIAGNOSIS — R918 Other nonspecific abnormal finding of lung field: Secondary | ICD-10-CM | POA: Diagnosis not present

## 2022-05-21 NOTE — Assessment & Plan Note (Signed)
S/p SBRT to RUL nodule in 04/2021. CT from Nov stable. CXR at previous OV showed a 2.8 cm nodular opacity in RUL with subtle surrounding ill defined opacity. Given his progressive dyspnea and symptoms, highly suspicious for recurrence of disease. CT chest was ordered for further evaluation, which was reassuring and appeared that this area was postradiation changes.  He did have some new nodules that will need to be followed up in 3 months.  Reviewed the imaging with Dr. Elsworth Soho.

## 2022-05-21 NOTE — Patient Instructions (Addendum)
Continue Trelegy 1 puff daily. Brush tongue and rinse mouth afterwards  Continue Albuterol inhaler 2 puffs or 3 mL neb every 6 hours as needed for shortness of breath or wheezing. Notify if symptoms persist despite rescue inhaler/neb use. Continue prednisone taper as previously prescribed  Continue Mucinex 600 mg Twice daily for chest congestion Continue CPAP nightly with O2 bled through   CT chest in 3 months   Follow up in 3 months after CT chest with Dr. Elsworth Soho. If symptoms do not improve or worsen, please contact office for sooner follow up or seek emergency care.

## 2022-05-21 NOTE — Progress Notes (Addendum)
'@Patient'  ID: [name], male    DOB: 01-Dec-1960, 61 y.o.   MRN: [id number]  Chief Complaint  Patient presents with   Follow-up    Referring provider: Sharilyn Sites, MD  HPI: 61 year old male, active smoker followed for COPD, OSA on CPAP, right upper lobe lung cancer.  He is a patient of Dr. Bari Mantis and last seen in office 05/14/2022 by [facility] NP.  Past medical history significant for hypertension, history of MI, CAD, PAF on Eliquis, CHF, allergic rhinitis, GERD, history of CVA, CKD, HLD, obesity, RLS, depression.  TEST/EVENTS:  11/2007 NPSG: Mild OSA 5.7/h 07/2015 NPSG AHI 16/h 06/2016 CPAP titration: 297 pounds, no O2 required; optimal CPAP pressure 8 cm 12/2018 CT chest: Advanced changes of emphysema, multiple noncalcified nodules unchanged when compared to 09/2018, largest right upper lobe 9 mm which is slightly increased when compared to 2018 06/2020 CT chest: RUL GG 12x10 mm nodule 06/2020 PFTs: moderate airway obstruction with ratio of 70, FEV1 51%, FVC 55%, no BD, DLCO 58% 09/2020 PET: SUV 2.1 03/2021 CT chest: subsolid 1.5x1.5 cm nodule RUL increasing in size and density 09/2021 CT chest: decreased size of RUL nodule to 7 mm; other nodules stable.  05/14/2022 CXR 2 view: There is a 2.8 cm nodular opacity in the right upper lobe on the PA radiograph.  There is also subtle surrounding ill-defined opacity. 05/17/2022 CT chest without contrast: Atherosclerosis.  Bandlike changes of added density along the subpleural lung in the right upper lobe, findings have developed since previous imaging obscuring the right upper lobe nodule and consistent with radiation changes.  Other areas of nodularity in the chest wall, most without change.  There is also signs of prior granulomatous disease.  There is a potential new nodule 5 mm in size.  There is a new small nodule in the superior segment of the right lower lobe measuring 7 mm.  There are scattered subpleural parenchymal nodules in the left upper  lobe which are stable.  Changes of pulmonary emphysema.  Moderate hepatic steatosis.  03/04/2022: OV with Dr. Elsworth Soho.  Breathing is at his baseline.  He is compliant with Trelegy.  Not required albuterol inhaler or nebs much recently.  Doing okay on CPAP.  He does feel like his mask may be too small and has a large leak.  Unable to obtain download on his CPAP.  We will provide him with prescription for larger mask.  Compliant by report.  Caution against driving drowsy. CT chest from November was stable.  Plan for 1 year follow-up in November 2023.  He was counseled on smoking cessation.  05/14/2022: OV with Hatem Cull NP for acute visit.  He feels as though over the last few months he has had a progressive decline of his breathing.  Feels like he gets winded even with minimal activity.  He is limited on what all he can do because of it.  Feels like even simple house chores are difficult.  He does have some occasional wheezing as well.  He has an occasional cough, which is unchanged from baseline and nonproductive.  Denies any weight loss, anorexia, hemoptysis, lower extremity edema, fevers, night sweats.  He is on Trelegy daily, which she would like to remain on as he has a lot of samples at home.  He has not had to use his albuterol more and is using it a few times a day, especially over the last few weeks.  He is also feeling just a little more fatigued.  Does  not feel like he is having any issues with the CPAP and wears it nightly.  Denies any drowsy driving.  CXR was concerning with a 2.8 cm nodular opacity in the right upper lobe, which is consistent with previous malignancy.  CT chest was ordered for further evaluation, seems area was postradiation changes.  No evidence of superimposed infection.  He did have some new nodules so we will plan for repeat CT in 3 months.  Reviewed imaging with Dr. Elsworth Soho..  He was also treated for possible AECOPD with prednisone taper.  05/21/2022: Today-follow-up Patient presents today  for follow-up after being treated for AECOPD and undergoing CT chest.  He reports feeling better today.  He is still on the prednisone taper but feels as though it is helping his breathing.  Also feels like he has little bit more energy.  Cough is unchanged from his baseline and remains nonproductive and occasional.  He denies any weight loss, anorexia, hemoptysis, lower extremity edema, fevers, night sweats, orthopnea.  He is on Trelegy daily.  Not using his albuterol much over the past few days.  Continues on CPAP nightly.  Allergies  Allergen Reactions   Contrast Media [Iodinated Contrast Media] Other (See Comments)    Pt must be premedicated before given contrast media - stops heart   Iohexol Other (See Comments)     Consult with radiologist before pre meds are given.Desc: PT. STATES HEART STOPPED HAS TO BE PREMED.     Immunization History  Administered Date(s) Administered   Influenza,inj,Quad PF,6+ Mos 09/22/2020    Past Medical History:  Diagnosis Date   Abnormal myocardial perfusion study 01/01/2011   there a small to moderate sized inferobasal scar   Barrett's esophagus    Cataracts, bilateral    Chronic kidney disease    hx of kidney stones   Claudication ([place]) 11/16/2011   PV test perform shows normal   COPD (chronic obstructive pulmonary disease) ([place])    Depression    Diabetes ([place])    type 2 diabetes mellitus   Dysrhythmia    GERD (gastroesophageal reflux disease)    Glaucoma    Hernia of abdominal wall    History of radiation therapy    Right lung, SBRT- 04/16/21-04/30/21- Dr. Gery Pray   HTN (hypertension)    Hyperlipidemia    Morbid obesity ([place])    Myocardial infarction ([place]) 2008,2009,2009   OSA (obstructive sleep apnea)    on cpap   PAF (paroxysmal atrial fibrillation) ([place])    S/P colonoscopy 2009   3-4 mm transverse colon erosions likely secondary to  ASA   S/P endoscopy 10/2010   moderate erosive gastritis, Barrett's esophagus 1-2cm   Sleep  apnea    SOB (shortness of breath) 11/03/2007   2D [place] EF 50%-55%   Stroke ([place]) 2017    Tobacco History: Social History   Tobacco Use  Smoking Status Every Day   Packs/day: 0.50   Years: 30.00   Total pack years: 15.00   Types: Cigarettes  Smokeless Tobacco Never  Tobacco Comments   smokes 1/2 pack per day 05/21/22   Ready to quit: Not Answered Counseling given: Not Answered Tobacco comments: smokes 1/2 pack per day 05/21/22   Outpatient Medications Prior to Visit  Medication Sig Dispense Refill   albuterol (VENTOLIN HFA) 108 (90 Base) MCG/ACT inhaler INHALE TWO PUFFS into THE lungs EVERY 6 HOURS AS NEEDED FOR SHORTNESS OF BREATH OR wheezing 8.5 g 3   apixaban (ELIQUIS) 5 MG TABS tablet Take  1 tablet (5 mg total) by mouth 2 (two) times daily. 1 tablet 0   blood glucose meter kit and supplies Dispense based on patient and insurance preference. Use up to two times daily as directed. (FOR ICD-E11.65) 1 each 5   Blood Glucose Monitoring Suppl (ONETOUCH VERIO) w/Device KIT 1 each by Does not apply route as needed. 1 kit 0   Continuous Blood Gluc Receiver (FREESTYLE LIBRE 2 READER) DEVI As directed 1 each 0   Continuous Blood Gluc Sensor (FREESTYLE LIBRE 2 SENSOR) MISC 1 Piece by Does not apply route every 14 (fourteen) days. 2 each 3   Cyanocobalamin (VITAMIN B-12 IJ) Inject 1,000 mcg as directed every 30 (thirty) days.     diazepam (VALIUM) 5 MG tablet Take 5 mg by mouth 2 (two) times daily.     Dulaglutide 1.5 MG/0.5ML SOPN Inject 1.5 mg into the skin once a week.     escitalopram (LEXAPRO) 20 MG tablet Take 20 mg by mouth in the morning.     fenofibrate (TRICOR) 145 MG tablet Take 145 mg by mouth in the morning.     Fluticasone-Umeclidin-Vilant 100-62.5-25 MCG/INH AEPB Inhale 1 puff into the lungs daily. Trelegy     furosemide (LASIX) 80 MG tablet Take 80 mg by mouth in the morning.  4   insulin regular human CONCENTRATED (HUMULIN R U-500 KWIKPEN) 500 UNIT/ML KwikPen Inject 50  Units into the skin 3 (three) times daily with meals. Only when blood glucose is above 90 and eating. 12 mL 2   isosorbide mononitrate (IMDUR) 120 MG 24 hr tablet Take 120 mg by mouth in the morning.     Lancets (ONETOUCH DELICA PLUS [id number]) MISC USE TO check blood sugar FOUR TIMES DAILY AS DIRECTED 200 each 1   lisinopril (ZESTRIL) 10 MG tablet Take 1 tablet (10 mg total) by mouth daily. 90 tablet 1   meclizine (ANTIVERT) 25 MG tablet Take 25 mg by mouth every 6 (six) hours as needed for dizziness or nausea.     metoprolol tartrate (LOPRESSOR) 100 MG tablet Take 1 tablet (100 mg total) by mouth 2 (two) times daily. OFFICE VISIT NEEDED BEFORE ADDITIONAL REFILLS 28 tablet 0   modafinil (PROVIGIL) 200 MG tablet Take 400 mg by mouth in the morning.     nitroGLYCERIN (NITROLINGUAL) 0.4 MG/SPRAY spray Place 1 spray under the tongue every 5 (five) minutes x 3 doses as needed for chest pain. 12 g 0   ONETOUCH VERIO test strip USE TO check blood sugar FOUR TIMES DAILY AS DIRECTED 200 strip 1   pantoprazole (PROTONIX) 40 MG tablet TAKE ONE TABLET BY MOUTH TWICE DAILY BEFORE A meal 180 tablet 3   polyethylene glycol-electrolytes (TRILYTE) 420 g solution Take 4,000 mLs by mouth as directed. 4000 mL 0   potassium chloride SA (K-DUR,KLOR-CON) 20 MEQ tablet Take 1 tablet (20 mEq total) by mouth 2 (two) times daily. 30 tablet 0   predniSONE (DELTASONE) 10 MG tablet 4 tabs for 2 days, then 3 tabs for 2 days, 2 tabs for 2 days, then 1 tab for 2 days, then stop 20 tablet 0   rosuvastatin (CRESTOR) 20 MG tablet Take 20 mg by mouth in the morning.     Vitamin D, Ergocalciferol, (DRISDOL) 1.25 MG (50000 UNIT) CAPS capsule Take 1 capsule (50,000 Units total) by mouth every 7 (seven) days. (Patient taking differently: Take 50,000 Units by mouth every Friday.) 12 capsule 0   TRUEPLUS PEN NEEDLES 31G X 8 MM MISC USE  AS DIRECTED TWICE DAILY 100 each 3   albuterol (PROVENTIL) (2.5 MG/3ML) 0.083% nebulizer solution Take 3  mLs (2.5 mg total) by nebulization every 6 (six) hours as needed for wheezing or shortness of breath. 75 mL 12   No facility-administered medications prior to visit.     Review of Systems:   Constitutional: No weight loss or gain, night sweats, fevers, chills. +fatigue (improved) HEENT: No headaches, difficulty swallowing, tooth/dental problems, or sore throat. No sneezing, itching, ear ache, nasal congestion, or post nasal drip CV:  No chest pain, orthopnea, PND, swelling in lower extremities, anasarca, dizziness, palpitations, syncope Resp: +shortness of breath with exertion (improved); occasional nonproductive cough. No wheezing. No excess mucus or change in color of mucus.  No hemoptysis. No chest wall deformity GI:  No heartburn, indigestion, abdominal pain, nausea, vomiting, diarrhea, change in bowel habits, loss of appetite, bloody stools.  Skin: No rash, lesions, ulcerations MSK:  No joint pain or swelling.  No decreased range of motion.  No back pain. Neuro: No dizziness or lightheadedness.  Psych: No depression or anxiety. Mood stable.     Physical Exam:  BP 108/68 (BP Location: Left Arm, Cuff Size: Normal)   Pulse 85   Ht '5\' 6"'  (1.676 m)   Wt (!) 319 lb 6.4 oz (144.9 kg)   SpO2 92%   BMI 51.55 kg/m   GEN: Pleasant, interactive, chronically-ill appearing; morbidly obese; in no acute distress. HEENT:  Normocephalic and atraumatic. PERRLA. Sclera white. Nasal turbinates pink, moist and patent bilaterally. No rhinorrhea present. Oropharynx pink and moist, without exudate or edema. No lesions, ulcerations, or postnasal drip.  NECK:  Supple w/ fair ROM. No JVD present. Normal carotid impulses w/o bruits. Thyroid symmetrical with no goiter or nodules palpated. No lymphadenopathy.   CV: RRR, no m/r/g, no peripheral edema. Pulses intact, +2 bilaterally. No cyanosis, pallor or clubbing. PULMONARY:  Unlabored, regular breathing.  Diminished posteriorly w/o wheezes/rales/rhonchi. No  accessory muscle use. No dullness to percussion. GI: BS present and normoactive. Soft, non-tender to palpation. No organomegaly or masses detected. No CVA tenderness. MSK: No erythema, warmth or tenderness. Cap refil <2 sec all extrem. No deformities or joint swelling noted.  Neuro: A/Ox3. No focal deficits noted.   Skin: Warm, no lesions or rashe Psych: Normal affect and behavior. Judgement and thought content appropriate.     Lab Results:  CBC    Component Value Date/Time   WBC 11.8 (H) 01/30/2019 1320   RBC 4.95 01/30/2019 1320   HGB 15.9 01/30/2019 1320   HCT 45.9 01/30/2019 1320   PLT 235 01/30/2019 1320   MCV 92.7 01/30/2019 1320   MCH 32.1 01/30/2019 1320   MCHC 34.6 01/30/2019 1320   RDW 12.2 01/30/2019 1320   LYMPHSABS 2.8 06/02/2015 1526   MONOABS 1.0 06/02/2015 1526   EOSABS 0.6 06/02/2015 1526   BASOSABS 0.1 06/02/2015 1526    BMET    Component Value Date/Time   NA 142 04/26/2022 1353   K 4.0 04/26/2022 1353   CL 103 04/26/2022 1353   CO2 23 04/26/2022 1353   GLUCOSE 108 (H) 04/26/2022 1353   GLUCOSE 209 (H) 01/30/2019 1320   BUN 11 04/26/2022 1353   CREATININE 1.22 04/26/2022 1353   CALCIUM 9.5 04/26/2022 1353   GFRNONAA 80 12/04/2020 0000   GFRAA 93 12/04/2020 0000    BNP    Component Value Date/Time   BNP 18.0 11/27/2017 1511     Imaging:  CT Chest Wo Contrast  Result  Date: 05/17/2022 CLINICAL DATA:  History of lung cancer post treatment with pulmonary nodule.w * Tracking Code: BO * EXAM: CT CHEST WITHOUT CONTRAST TECHNIQUE: Multidetector CT imaging of the chest was performed following the standard protocol without IV contrast. RADIATION DOSE REDUCTION: This exam was performed according to the departmental dose-optimization program which includes automated exposure control, adjustment of the mA and/or kV according to patient size and/or use of iterative reconstruction technique. COMPARISON:  September 08, 2021. FINDINGS: Cardiovascular: Calcified  aortic atherosclerosis. Normal heart size. Normal caliber of the thoracic aorta. Normal caliber of central pulmonary vessels. Post median sternotomy for CABG. Limited assessment of cardiovascular structures given lack of intravenous contrast. Mediastinum/Nodes: Esophagus grossly normal. No mediastinal lymphadenopathy. No gross hilar adenopathy. Lungs/Pleura: Bandlike changes of added density along the subpleural lung in the RIGHT upper lobe, findings have developed since previous imaging obscuring the RIGHT upper lobe nodule. Other areas of nodularity in the chest most without change 5 mm nodule in the RIGHT upper lobe as an example on image 40/4. There also signs of prior granulomatous disease. In the anterior RIGHT upper lobe on image 62/4 there is a 5 mm nodule also previously 5 mm. Potential new small nodule on image 66/4) also approaching 5 mm size New small nodule in the superior segment of the RIGHT lower lobe (image 72/4) 7 mm. Scattered subpleural and parenchymal nodules in the LEFT upper lobe are stable. Upper Abdomen: Moderate hepatic steatosis. No upper abdominal lymphadenopathy. Imaged portions the pancreas, spleen and adrenal glands as well as upper aspects of LEFT and RIGHT kidney are unremarkable. No acute findings. Musculoskeletal: Separation of the sternotomy without change. No acute bone finding. No destructive bone process. Spinal degenerative changes. Degenerative changes are moderate. IMPRESSION: 1. Post radiation changes along the minor fissure in the RIGHT chest obscuring the upper lobe nodule that was present on previous imaging. 2. New small nodule in the RIGHT upper lobe and RIGHT lower lobe, in this patient with multiple pulmonary nodules potentially infectious or inflammatory but would suggest short interval follow-up in short interval, perhaps 3 months to assess for stability/resolution. 3. Moderate hepatic steatosis.  Post median sternotomy for CABG. 4. Aortic atherosclerosis. 5.  Pulmonary emphysema. Aortic Atherosclerosis (ICD10-I70.0) and Emphysema (ICD10-J43.9). Electronically Signed   By: Zetta Bills M.D.   On: 05/17/2022 10:43   DG Chest 2 View  Result Date: 05/14/2022 CLINICAL DATA:  Provided history: Increased shortness of breath. Acute exacerbation of COPD versus fluid overload. EXAM: CHEST - 2 VIEW COMPARISON:  CT chest 09/08/2021. Prior chest radiographs 04/01/2019 and earlier. FINDINGS: Prior median sternotomy. As before, there are multiple fractures within the sternal cerclage wires. Heart size within normal limits. A 2.8 cm nodular opacity projects in the region of the right upper lobe on the PA radiograph. There is also subtle surrounding ill-defined opacity. No appreciable airspace consolidation within the left lung. No evidence of pleural effusion or pneumothorax. No acute bony abnormality identified. Degenerative changes of the spine. IMPRESSION: Apparent 2.8 cm nodule with surrounding ill-defined opacity in the right upper lobe. Differential considerations include pulmonary malignancy, an infectious/inflammatory process or scarring, among others. A chest CT is recommended for further evaluation Electronically Signed   By: Kellie Simmering D.O.   On: 05/14/2022 16:20         Latest Ref Rng & Units 03/20/2020    3:38 PM  PFT Results  FVC-Pre L 2.70  P  FVC-Predicted Pre % 55  P  FVC-Post L 2.75  P  FVC-Predicted Post % 56  P  Pre FEV1/FVC % % 70  P  Post FEV1/FCV % % 74  P  FEV1-Pre L 1.90  P  FEV1-Predicted Pre % 51  P  FEV1-Post L 2.04  P  DLCO uncorrected ml/min/mmHg 16.47  P  DLCO UNC% % 58  P  DLCO corrected ml/min/mmHg 16.47  P  DLCO COR %Predicted % 58  P  DLVA Predicted % 85  P  TLC L 5.35  P  TLC % Predicted % 76  P  RV % Predicted % 112  P    P Preliminary result    No results found for: "NITRICOXIDE"      Assessment & Plan:   COPD with acute exacerbation (HCC) Resolving AECOPD.  He is clinically improved; still on prednisone  and not quite back to his baseline.  Advised that he continue prednisone taper as previously prescribed.  Vital signs again stable in office today.  Walking oximetry at last visit without any desaturations.  Continue triple therapy with Trelegy and as needed albuterol.  Patient Instructions  Continue Trelegy 1 puff daily. Brush tongue and rinse mouth afterwards  Continue Albuterol inhaler 2 puffs or 3 mL neb every 6 hours as needed for shortness of breath or wheezing. Notify if symptoms persist despite rescue inhaler/neb use. Continue prednisone taper as previously prescribed  Continue Mucinex 600 mg Twice daily for chest congestion Continue CPAP nightly with O2 bled through   CT chest in 3 months   Follow up in 3 months after CT chest with Dr. Elsworth Soho. If symptoms do not improve or worsen, please contact office for sooner follow up or seek emergency care.     Lung cancer ([place]) S/p SBRT to RUL nodule in 04/2021. CT from Nov stable. CXR at previous OV showed a 2.8 cm nodular opacity in RUL with subtle surrounding ill defined opacity. Given his progressive dyspnea and symptoms, highly suspicious for recurrence of disease. CT chest was ordered for further evaluation, which was reassuring and appeared that this area was postradiation changes.  He did have some new nodules that will need to be followed up in 3 months.  Reviewed the imaging with Dr. Elsworth Soho.    I spent 28 minutes of dedicated to the care of this patient on the date of this encounter to include pre-visit review of records, face-to-face time with the patient discussing conditions above, post visit ordering of testing, clinical documentation with the electronic health record, making appropriate referrals as documented, and communicating necessary findings to members of the patients care team.  Clayton Bibles, NP 06/11/2022  Pt aware and understands NP's role.

## 2022-05-21 NOTE — Assessment & Plan Note (Addendum)
Resolving AECOPD.  He is clinically improved; still on prednisone and not quite back to his baseline.  Advised that he continue prednisone taper as previously prescribed.  Vital signs again stable in office today.  Walking oximetry at last visit without any desaturations.  Continue triple therapy with Trelegy and as needed albuterol.  Patient Instructions  Continue Trelegy 1 puff daily. Brush tongue and rinse mouth afterwards  Continue Albuterol inhaler 2 puffs or 3 mL neb every 6 hours as needed for shortness of breath or wheezing. Notify if symptoms persist despite rescue inhaler/neb use. Continue prednisone taper as previously prescribed  Continue Mucinex 600 mg Twice daily for chest congestion Continue CPAP nightly with O2 bled through   CT chest in 3 months   Follow up in 3 months after CT chest with Dr. Elsworth Soho. If symptoms do not improve or worsen, please contact office for sooner follow up or seek emergency care.

## 2022-05-23 ENCOUNTER — Other Ambulatory Visit: Payer: Self-pay | Admitting: "Endocrinology

## 2022-05-24 DIAGNOSIS — E1165 Type 2 diabetes mellitus with hyperglycemia: Secondary | ICD-10-CM | POA: Diagnosis not present

## 2022-05-26 DIAGNOSIS — J449 Chronic obstructive pulmonary disease, unspecified: Secondary | ICD-10-CM | POA: Diagnosis not present

## 2022-05-26 DIAGNOSIS — G4733 Obstructive sleep apnea (adult) (pediatric): Secondary | ICD-10-CM | POA: Diagnosis not present

## 2022-05-31 DIAGNOSIS — J449 Chronic obstructive pulmonary disease, unspecified: Secondary | ICD-10-CM | POA: Diagnosis not present

## 2022-05-31 DIAGNOSIS — E1165 Type 2 diabetes mellitus with hyperglycemia: Secondary | ICD-10-CM | POA: Diagnosis not present

## 2022-05-31 DIAGNOSIS — I10 Essential (primary) hypertension: Secondary | ICD-10-CM | POA: Diagnosis not present

## 2022-06-04 DIAGNOSIS — J9611 Chronic respiratory failure with hypoxia: Secondary | ICD-10-CM | POA: Diagnosis not present

## 2022-06-04 DIAGNOSIS — J449 Chronic obstructive pulmonary disease, unspecified: Secondary | ICD-10-CM | POA: Diagnosis not present

## 2022-06-04 DIAGNOSIS — G2581 Restless legs syndrome: Secondary | ICD-10-CM | POA: Diagnosis not present

## 2022-06-04 DIAGNOSIS — G4733 Obstructive sleep apnea (adult) (pediatric): Secondary | ICD-10-CM | POA: Diagnosis not present

## 2022-06-10 DIAGNOSIS — I10 Essential (primary) hypertension: Secondary | ICD-10-CM | POA: Diagnosis not present

## 2022-06-10 DIAGNOSIS — I480 Paroxysmal atrial fibrillation: Secondary | ICD-10-CM | POA: Diagnosis not present

## 2022-06-10 DIAGNOSIS — I25708 Atherosclerosis of coronary artery bypass graft(s), unspecified, with other forms of angina pectoris: Secondary | ICD-10-CM | POA: Diagnosis not present

## 2022-06-11 ENCOUNTER — Telehealth: Payer: Self-pay | Admitting: Nurse Practitioner

## 2022-06-11 MED ORDER — PREDNISONE 10 MG PO TABS: ORAL_TABLET | ORAL | 0 refills | Status: DC

## 2022-06-11 NOTE — Telephone Encounter (Signed)
Called and spoke with patient directly. He stated he is always wheezing but has not noticed an increase in his wheezing lately. He has been more SOB over the past few days. He has been using his albuterol inhaler at least 6 times a day while using his Trelegy inhaler once daily. He has a productive cough with clear phlegm. Denied any chest pain, fever or runny nose. Confirmed he is still using his oxygen at night with his cpap machine.   He stated otherwise he feels fine besides having the albuterol more during the day.   Katie, do you have any recommendations for him? Thanks!

## 2022-06-11 NOTE — Telephone Encounter (Signed)
Unsure regarding the suggestion for POC; did cardiology note that he had desaturations during their visit? We walked him at previous OV and he did not desaturate on room air so wouldn't qualify.  Symptoms consistent with possible AECOPD. Please send rx for prednisone taper with 10 mg tabs - 4 tabs for 2 days, then 3 tabs for 2 days, 2 tabs for 2 days, then 1 tab for 2 days, then stop. Take in AM with food. Please schedule OV next week for follow up. If worsening symptoms develop over the weekend, please seek emergency care. Thanks!

## 2022-06-11 NOTE — Telephone Encounter (Signed)
Called and spoke with patient. He verbalized understanding. RX has been sent in.   Nothing further needed at time of call.

## 2022-06-15 ENCOUNTER — Telehealth: Payer: Self-pay | Admitting: "Endocrinology

## 2022-06-15 NOTE — Telephone Encounter (Signed)
New message    Patient has blood sugar reading    8/14 last night 545 finger stick    8/15 this morning 25? finger stick

## 2022-06-16 NOTE — Telephone Encounter (Signed)
F/u    06/14/22 in the evening  545  06/14/22 @ 10:28am 347  06/15/22 in the morning 295   06/15/22 Last night 544  06/16/22 This morning  238

## 2022-06-16 NOTE — Telephone Encounter (Signed)
Tried to obtain more readings for the past 2-3 days but pt was not at home to look at his meter. Pt states his libre 2 CGM sensors have not been working well. Pt stated he would call me back with more readings once he got home.

## 2022-06-17 NOTE — Telephone Encounter (Signed)
Pt aware. He said his sugar was 352 this morning he ate.

## 2022-06-17 NOTE — Telephone Encounter (Signed)
Left a message requesting pt return call to the office. ?

## 2022-06-24 DIAGNOSIS — E1165 Type 2 diabetes mellitus with hyperglycemia: Secondary | ICD-10-CM | POA: Diagnosis not present

## 2022-06-24 DIAGNOSIS — E538 Deficiency of other specified B group vitamins: Secondary | ICD-10-CM | POA: Diagnosis not present

## 2022-06-26 ENCOUNTER — Other Ambulatory Visit: Payer: Self-pay | Admitting: Pulmonary Disease

## 2022-06-26 DIAGNOSIS — J449 Chronic obstructive pulmonary disease, unspecified: Secondary | ICD-10-CM | POA: Diagnosis not present

## 2022-06-26 DIAGNOSIS — G4733 Obstructive sleep apnea (adult) (pediatric): Secondary | ICD-10-CM | POA: Diagnosis not present

## 2022-07-01 DIAGNOSIS — J449 Chronic obstructive pulmonary disease, unspecified: Secondary | ICD-10-CM | POA: Diagnosis not present

## 2022-07-01 DIAGNOSIS — I10 Essential (primary) hypertension: Secondary | ICD-10-CM | POA: Diagnosis not present

## 2022-07-01 DIAGNOSIS — E1165 Type 2 diabetes mellitus with hyperglycemia: Secondary | ICD-10-CM | POA: Diagnosis not present

## 2022-07-02 ENCOUNTER — Telehealth: Payer: Self-pay | Admitting: *Deleted

## 2022-07-02 NOTE — Patient Outreach (Signed)
  Care Coordination   07/02/2022 Name: [name] MRN: [id number] DOB: 12/03/1960   Care Coordination Outreach Attempts:  An unsuccessful telephone outreach was attempted today to offer the patient information about available care coordination services as a benefit of their health plan.   Follow Up Plan:  Additional outreach attempts will be made to offer the patient care coordination information and services.   Encounter Outcome:  No Answer  Care Coordination Interventions Activated:  No   Care Coordination Interventions:  No, not indicated    Chong Sicilian, BSN, RN-BC [facility], community Dial: [phone]

## 2022-07-05 DIAGNOSIS — J9611 Chronic respiratory failure with hypoxia: Secondary | ICD-10-CM | POA: Diagnosis not present

## 2022-07-05 DIAGNOSIS — G4733 Obstructive sleep apnea (adult) (pediatric): Secondary | ICD-10-CM | POA: Diagnosis not present

## 2022-07-05 DIAGNOSIS — G2581 Restless legs syndrome: Secondary | ICD-10-CM | POA: Diagnosis not present

## 2022-07-05 DIAGNOSIS — J449 Chronic obstructive pulmonary disease, unspecified: Secondary | ICD-10-CM | POA: Diagnosis not present

## 2022-07-12 ENCOUNTER — Telehealth: Payer: Self-pay | Admitting: "Endocrinology

## 2022-07-12 NOTE — Telephone Encounter (Signed)
Readings:  9/7-356 in the morning, 562 at bedtime 9/8- 385 in the morning, did not check it at bedtime 9/9-406 in the morning, 352 at bedtime 9/10- did not check sugar - slept most of the day 9/11- 473 in the morning  Has been taking 60 units.  Joy, Patient wants you to call him about the Elenor Legato - he is having issues

## 2022-07-13 NOTE — Telephone Encounter (Signed)
Discussed with pt, understanding voiced. 

## 2022-07-15 DIAGNOSIS — Z7984 Long term (current) use of oral hypoglycemic drugs: Secondary | ICD-10-CM | POA: Diagnosis not present

## 2022-07-15 DIAGNOSIS — E1136 Type 2 diabetes mellitus with diabetic cataract: Secondary | ICD-10-CM | POA: Diagnosis not present

## 2022-07-15 DIAGNOSIS — H25813 Combined forms of age-related cataract, bilateral: Secondary | ICD-10-CM | POA: Diagnosis not present

## 2022-07-15 DIAGNOSIS — Z794 Long term (current) use of insulin: Secondary | ICD-10-CM | POA: Diagnosis not present

## 2022-07-25 DIAGNOSIS — J449 Chronic obstructive pulmonary disease, unspecified: Secondary | ICD-10-CM | POA: Diagnosis not present

## 2022-07-25 DIAGNOSIS — G4733 Obstructive sleep apnea (adult) (pediatric): Secondary | ICD-10-CM | POA: Diagnosis not present

## 2022-08-04 DIAGNOSIS — I5030 Unspecified diastolic (congestive) heart failure: Secondary | ICD-10-CM | POA: Diagnosis not present

## 2022-08-04 DIAGNOSIS — N1831 Chronic kidney disease, stage 3a: Secondary | ICD-10-CM | POA: Diagnosis not present

## 2022-08-04 DIAGNOSIS — Z125 Encounter for screening for malignant neoplasm of prostate: Secondary | ICD-10-CM | POA: Diagnosis not present

## 2022-08-04 DIAGNOSIS — G2581 Restless legs syndrome: Secondary | ICD-10-CM | POA: Diagnosis not present

## 2022-08-04 DIAGNOSIS — E1140 Type 2 diabetes mellitus with diabetic neuropathy, unspecified: Secondary | ICD-10-CM | POA: Diagnosis not present

## 2022-08-04 DIAGNOSIS — J9611 Chronic respiratory failure with hypoxia: Secondary | ICD-10-CM | POA: Diagnosis not present

## 2022-08-04 DIAGNOSIS — Z0001 Encounter for general adult medical examination with abnormal findings: Secondary | ICD-10-CM | POA: Diagnosis not present

## 2022-08-04 DIAGNOSIS — Z1331 Encounter for screening for depression: Secondary | ICD-10-CM | POA: Diagnosis not present

## 2022-08-04 DIAGNOSIS — J449 Chronic obstructive pulmonary disease, unspecified: Secondary | ICD-10-CM | POA: Diagnosis not present

## 2022-08-04 DIAGNOSIS — I130 Hypertensive heart and chronic kidney disease with heart failure and stage 1 through stage 4 chronic kidney disease, or unspecified chronic kidney disease: Secondary | ICD-10-CM | POA: Diagnosis not present

## 2022-08-04 DIAGNOSIS — E538 Deficiency of other specified B group vitamins: Secondary | ICD-10-CM | POA: Diagnosis not present

## 2022-08-04 DIAGNOSIS — Z6841 Body Mass Index (BMI) 40.0 and over, adult: Secondary | ICD-10-CM | POA: Diagnosis not present

## 2022-08-04 DIAGNOSIS — G4733 Obstructive sleep apnea (adult) (pediatric): Secondary | ICD-10-CM | POA: Diagnosis not present

## 2022-08-04 DIAGNOSIS — Z23 Encounter for immunization: Secondary | ICD-10-CM | POA: Diagnosis not present

## 2022-08-04 DIAGNOSIS — E559 Vitamin D deficiency, unspecified: Secondary | ICD-10-CM | POA: Diagnosis not present

## 2022-08-09 ENCOUNTER — Encounter: Payer: Self-pay | Admitting: "Endocrinology

## 2022-08-09 ENCOUNTER — Ambulatory Visit: Payer: Medicare HMO | Admitting: "Endocrinology

## 2022-08-09 VITALS — BP 118/76 | HR 80 | Ht 66.0 in | Wt 319.0 lb

## 2022-08-09 DIAGNOSIS — E782 Mixed hyperlipidemia: Secondary | ICD-10-CM

## 2022-08-09 DIAGNOSIS — I10 Essential (primary) hypertension: Secondary | ICD-10-CM

## 2022-08-09 DIAGNOSIS — F17200 Nicotine dependence, unspecified, uncomplicated: Secondary | ICD-10-CM

## 2022-08-09 DIAGNOSIS — E1159 Type 2 diabetes mellitus with other circulatory complications: Secondary | ICD-10-CM

## 2022-08-09 DIAGNOSIS — R69 Illness, unspecified: Secondary | ICD-10-CM | POA: Diagnosis not present

## 2022-08-09 LAB — POCT GLYCOSYLATED HEMOGLOBIN (HGB A1C): HbA1c, POC (controlled diabetic range): 8.9 % — AB (ref 0.0–7.0)

## 2022-08-09 MED ORDER — FREESTYLE LIBRE 2 SENSOR MISC: 1.0000 | 3 refills | Status: DC

## 2022-08-09 MED ORDER — HUMULIN R U-500 KWIKPEN 500 UNIT/ML ~~LOC~~ SOPN: 80.0000 [IU] | PEN_INJECTOR | Freq: Three times a day (TID) | SUBCUTANEOUS | 2 refills | Status: DC

## 2022-08-09 NOTE — Progress Notes (Signed)
08/09/2022  Endocrinology follow-up note   Subjective:    Patient ID: [name], male    DOB: [birth year].  he is being seen in follow-up  for management of currently uncontrolled symptomatic type 2 diabetes, hypertension, hyperlipidemia, obesity. PMD:  Sharilyn Sites, MD.   Past Medical History:  Diagnosis Date   Abnormal myocardial perfusion study 01/01/2011   there a small to moderate sized inferobasal scar   Barrett's esophagus    Cataracts, bilateral    Chronic kidney disease    hx of kidney stones   Claudication ([place]) 11/16/2011   PV test perform shows normal   COPD (chronic obstructive pulmonary disease) ([place])    Depression    Diabetes ([place])    type 2 diabetes mellitus   Dysrhythmia    GERD (gastroesophageal reflux disease)    Glaucoma    Hernia of abdominal wall    History of radiation therapy    Right lung, SBRT- 04/16/21-04/30/21- Dr. Gery Pray   HTN (hypertension)    Hyperlipidemia    Morbid obesity ([place])    Myocardial infarction ([place]) 2008,2009,2009   OSA (obstructive sleep apnea)    on cpap   PAF (paroxysmal atrial fibrillation) ([place])    S/P colonoscopy 2009   3-4 mm transverse colon erosions likely secondary to  ASA   S/P endoscopy 10/2010   moderate erosive gastritis, Barrett's esophagus 1-2cm   Sleep apnea    SOB (shortness of breath) 11/03/2007   2D Echo EF 50%-55%   Stroke ([place]) 2017   Past Surgical History:  Procedure Laterality Date   BIOPSY N/A 04/29/2015   Procedure: BIOPSY;  Surgeon: Danie Binder, MD;  Location: [facility];  Service: Endoscopy;  Laterality: N/A;   BIOPSY  08/01/2018   Procedure: BIOPSY;  Surgeon: Danie Binder, MD;  [facility];  Service: Endoscopy;;  esophageal   BIOPSY  10/12/2021   Procedure: BIOPSY;  Surgeon: Eloise Harman, DO;  [facility];  Service: Endoscopy;;   CABG X 4  03/2008   CARDIAC CATHETERIZATION  2009   stent placement to the left circumflex a 2.25    CARDIAC  CATHETERIZATION  07/08/2010   CARDIAC CATHETERIZATION N/A 11/02/2019   COLONOSCOPY WITH PROPOFOL N/A 09/27/2017   normal ileum, twenty 4 to 8 mm polyps in the sigmoid colon, descending colon, splenic flexure, transverse colon, ascending colon, cecum.  An additional three 2 to 4 mm polyps in the rectum and the descending colon.  Diverticulosis, internal hemorrhoids.  Surgical pathology found the polyps to be one fragment of hyperplastic polyp and 22 fragments of tubular adenoma..  Recommended repeat colonoscop   COLONOSCOPY WITH PROPOFOL N/A 10/12/2021   Procedure: COLONOSCOPY WITH PROPOFOL;  Surgeon: Eloise Harman, DO;  [facility];  Service: Endoscopy;  Laterality: N/A;  1:30pm   CORONARY ARTERY BYPASS GRAFT  2008   4 vessels   CORONARY STENT PLACEMENT  12/29/12   CORONARY STENT PLACEMENT  12/2012   ESOPHAGEAL DILATION N/A 04/29/2015   Procedure: ESOPHAGEAL DILATION 15 mm, 16 mm;  Surgeon: Danie Binder, MD;  Location: [facility];  Service: Endoscopy;  Laterality: N/A;   ESOPHAGOGASTRODUODENOSCOPY  09/2011   Barrett's esophagus, no dysplasia on biopsies. Distal esophagitis. Status post dilation. Moderate gastritis and duodenitis, but biopsies benign. Next EGD in November 2015 for surveillance of Barrett's esophagus.   ESOPHAGOGASTRODUODENOSCOPY (EGD) WITH PROPOFOL N/A 04/29/2015   SLF: 1. Barretts esophagus 2. Moderate non-erosive gastritis.    ESOPHAGOGASTRODUODENOSCOPY (  EGD) WITH PROPOFOL N/A 08/01/2018   Barrett's, repeat in 5 years. Empiric dilatation, mild gastritis   HERNIA REPAIR     ventral hernia repair   LEFT HEART CATHETERIZATION WITH CORONARY/GRAFT ANGIOGRAM N/A 12/29/2012   Procedure: LEFT HEART CATHETERIZATION WITH Beatrix Fetters;  Surgeon: Troy Sine, MD;  Location: [facility] CATH LAB;  Service: Cardiovascular;  Laterality: N/A;   ORIF FIBULA FRACTURE Right 06/03/2015   Procedure: OPEN REDUCTION INTERNAL FIXATION (ORIF) DISTAL FIBULA  FRACTURE;  Surgeon: Garald Balding, MD;  Location: [place];  Service: Orthopedics;  Laterality: Right;   PERCUTANEOUS CORONARY STENT INTERVENTION (PCI-S)  12/29/2012   Procedure: PERCUTANEOUS CORONARY STENT INTERVENTION (PCI-S);  Surgeon: Troy Sine, MD;  Location: [facility] CATH LAB;  Service: Cardiovascular;;   POLYPECTOMY  09/27/2017   Procedure: POLYPECTOMY;  Surgeon: Danie Binder, MD;  [facility];  Service: Endoscopy;;  cecal polyp, ascending polyps x6, transverse colon polyps x6, splenic flexure polyps x2, descending colon polyps x6, sigmoid  colon polyp x1, rectal polyp x1    POLYPECTOMY  10/12/2021   Procedure: POLYPECTOMY;  Surgeon: Eloise Harman, DO;  [facility];  Service: Endoscopy;;   SAVORY DILATION  09/06/2011   Procedure: SAVORY DILATION;  Surgeon: Dorothyann Peng, MD;  Location: [facility];  Service: Endoscopy;  Laterality: N/A;  Dilated with 74m   SAVORY DILATION N/A 08/01/2018   Procedure: SAVORY DILATION;  Surgeon: FDanie Binder MD;  [facility];  Service: Endoscopy;  Laterality: N/A;   Social History   Socioeconomic History   Marital status: Divorced    Spouse name: Not on file   Number of children: Not on file   Years of education: Not on file   Highest education level: Not on file  Occupational History   Not on file  Tobacco Use   Smoking status: Every Day    Packs/day: 0.50    Years: 30.00    Total pack years: 15.00    Types: Cigarettes   Smokeless tobacco: Never   Tobacco comments:    smokes 1/2 pack per day 05/21/22  Vaping Use   Vaping Use: Never used  Substance and Sexual Activity   Alcohol use: Yes    Comment: occ beer   Drug use: No   Sexual activity: Never  Other Topics Concern   Not on file  Social History Narrative   Not on file   Social Determinants of Health   Financial Resource Strain: Not on file  Food Insecurity: Not on file  Transportation Needs: Not on file  Physical Activity: Not on file  Stress: Not on file  Social  Connections: Not on file   Outpatient Encounter Medications as of 08/09/2022  Medication Sig   albuterol (PROVENTIL) (2.5 MG/3ML) 0.083% nebulizer solution Take 3 mLs (2.5 mg total) by nebulization every 6 (six) hours as needed for wheezing or shortness of breath.   albuterol (VENTOLIN HFA) 108 (90 Base) MCG/ACT inhaler INHALE TWO PUFFS BY MOUTH INTO LUNGS EVERY 6 HOURS AS NEEDED FOR SHORTNESS OF BREATH OR FOR WHEEZING   apixaban (ELIQUIS) 5 MG TABS tablet Take 1 tablet (5 mg total) by mouth 2 (two) times daily.   B-D ULTRAFINE III SHORT PEN 31G X 8 MM MISC USE AS DIRECTED TWICE DAILY   blood glucose meter kit and supplies Dispense based on patient and insurance preference. Use up to two times daily as directed. (FOR ICD-E11.65)   Blood Glucose Monitoring Suppl (ONETOUCH VERIO)  w/Device KIT 1 each by Does not apply route as needed.   Continuous Blood Gluc Receiver (FREESTYLE LIBRE 2 READER) DEVI As directed   Continuous Blood Gluc Sensor (FREESTYLE LIBRE 2 SENSOR) MISC 1 Piece by Does not apply route every 14 (fourteen) days.   Cyanocobalamin (VITAMIN B-12 IJ) Inject 1,000 mcg as directed every 30 (thirty) days.   diazepam (VALIUM) 5 MG tablet Take 5 mg by mouth 2 (two) times daily.   Dulaglutide 1.5 MG/0.5ML SOPN Inject 1.5 mg into the skin once a week.   escitalopram (LEXAPRO) 20 MG tablet Take 20 mg by mouth in the morning.   fenofibrate (TRICOR) 145 MG tablet Take 145 mg by mouth in the morning.   Fluticasone-Umeclidin-Vilant 100-62.5-25 MCG/INH AEPB Inhale 1 puff into the lungs daily. Trelegy   furosemide (LASIX) 80 MG tablet Take 80 mg by mouth in the morning.   insulin regular human CONCENTRATED (HUMULIN R U-500 KWIKPEN) 500 UNIT/ML KwikPen Inject 80 Units into the skin 3 (three) times daily with meals. Only when blood glucose is above 90 and eating.   isosorbide mononitrate (IMDUR) 120 MG 24 hr tablet Take 120 mg by mouth in the morning.   Lancets (ONETOUCH DELICA PLUS [id number]) MISC USE  TO check blood sugar FOUR TIMES DAILY AS DIRECTED   lisinopril (ZESTRIL) 10 MG tablet Take 1 tablet (10 mg total) by mouth daily.   meclizine (ANTIVERT) 25 MG tablet Take 25 mg by mouth every 6 (six) hours as needed for dizziness or nausea.   metoprolol tartrate (LOPRESSOR) 100 MG tablet Take 1 tablet (100 mg total) by mouth 2 (two) times daily. OFFICE VISIT NEEDED BEFORE ADDITIONAL REFILLS   modafinil (PROVIGIL) 200 MG tablet Take 400 mg by mouth in the morning.   nitroGLYCERIN (NITROLINGUAL) 0.4 MG/SPRAY spray Place 1 spray under the tongue every 5 (five) minutes x 3 doses as needed for chest pain.   ONETOUCH VERIO test strip USE TO check blood sugar FOUR TIMES DAILY AS DIRECTED   pantoprazole (PROTONIX) 40 MG tablet TAKE ONE TABLET BY MOUTH TWICE DAILY BEFORE A meal   polyethylene glycol-electrolytes (TRILYTE) 420 g solution Take 4,000 mLs by mouth as directed.   potassium chloride SA (K-DUR,KLOR-CON) 20 MEQ tablet Take 1 tablet (20 mEq total) by mouth 2 (two) times daily.   predniSONE (DELTASONE) 10 MG tablet 4 tabs for 2 days, then 3 tabs for 2 days, 2 tabs for 2 days, then 1 tab for 2 days, then stop   predniSONE (DELTASONE) 10 MG tablet Take 4 tabs for 2 days, then 3 tabs for 2 days, 2 tabs for 2 days, then 1 tab for 2 days, then stop.   rosuvastatin (CRESTOR) 20 MG tablet Take 20 mg by mouth in the morning.   Vitamin D, Ergocalciferol, (DRISDOL) 1.25 MG (50000 UNIT) CAPS capsule Take 1 capsule (50,000 Units total) by mouth every 7 (seven) days. (Patient taking differently: Take 50,000 Units by mouth every Friday.)   [DISCONTINUED] Continuous Blood Gluc Sensor (FREESTYLE LIBRE 2 SENSOR) MISC 1 Piece by Does not apply route every 14 (fourteen) days.   [DISCONTINUED] insulin regular human CONCENTRATED (HUMULIN R U-500 KWIKPEN) 500 UNIT/ML KwikPen Inject 50 Units into the skin 3 (three) times daily with meals. Only when blood glucose is above 90 and eating. (Patient taking differently: Inject 70  Units into the skin 3 (three) times daily with meals. Only when blood glucose is above 90 and eating.)   No facility-administered encounter medications on file as of  08/09/2022.    ALLERGIES: Allergies  Allergen Reactions   Contrast Media [Iodinated Contrast Media] Other (See Comments)    Pt must be premedicated before given contrast media - stops heart   Iohexol Other (See Comments)     Consult with radiologist before pre meds are given.Desc: PT. STATES HEART STOPPED HAS TO BE PREMED.     VACCINATION STATUS: Immunization History  Administered Date(s) Administered   Influenza,inj,Quad PF,6+ Mos 09/22/2020    Diabetes He presents for his follow-up diabetic visit. He has type 2 diabetes mellitus. Onset time: He was diagnosed at approximate age of 29 years. His disease course has been worsening. There are no hypoglycemic associated symptoms. Pertinent negatives for hypoglycemia include no confusion, headaches, pallor or seizures. Pertinent negatives for diabetes include no blurred vision, no chest pain, no fatigue, no polydipsia, no polyphagia, no polyuria and no weakness. There are no hypoglycemic complications. Symptoms are worsening. Diabetic complications include a CVA and heart disease. Risk factors for coronary artery disease include diabetes mellitus, dyslipidemia, hypertension, male sex, obesity, tobacco exposure, sedentary lifestyle and family history. Current diabetic treatments: He was supposed to be on basal insulin, unfortunately he ran out and could not afford refills and he did not call clinic for alternatives. He is compliant with treatment none of the time. His weight is fluctuating minimally. He is following a generally unhealthy diet. When asked about meal planning, he reported none. He has not had a previous visit with a dietitian. He never participates in exercise. His home blood glucose trend is increasing steadily. His breakfast blood glucose range is generally >200 mg/dl.  His lunch blood glucose range is generally >200 mg/dl. His dinner blood glucose range is generally >200 mg/dl. His bedtime blood glucose range is generally >200 mg/dl. His overall blood glucose range is >200 mg/dl. [name] presents with his CGM which he has not used for the last month.  Reports shows she was 20% time in range, 17% level 1 hyperglycemia, 67% level 2 hyperglycemia.  He did not document hypoglycemia.  Lately, he switched to his meter which he is not using completely.  His point-of-care A1c is 8.9% increasing from 5.6% during his last presentation.       ) An ACE inhibitor/angiotensin II receptor blocker is being taken. He does not see a podiatrist.Eye exam is not current.  Hyperlipidemia This is a chronic problem. The current episode started more than 1 year ago. The problem is uncontrolled. Recent lipid tests were reviewed and are high. Exacerbating diseases include diabetes and obesity. Pertinent negatives include no chest pain, myalgias or shortness of breath. Current antihyperlipidemic treatment includes statins. Compliance problems include medication cost and psychosocial issues.  Risk factors for coronary artery disease include dyslipidemia, diabetes mellitus, hypertension, male sex, obesity, family history and a sedentary lifestyle.  Hypertension This is a chronic problem. The current episode started more than 1 year ago. The problem is uncontrolled. Pertinent negatives include no blurred vision, chest pain, headaches, neck pain, palpitations or shortness of breath. Risk factors for coronary artery disease include diabetes mellitus, dyslipidemia, male gender, obesity, family history, sedentary lifestyle and smoking/tobacco exposure. Past treatments include ACE inhibitors. Hypertensive end-organ damage includes CVA.     Review of systems  Constitutional: + Minimally fluctuating body weight,  current  Body mass index is 51.49 kg/m. , no fatigue, no subjective hyperthermia, no  subjective hypothermia    Objective:    BP 118/76   Pulse 80   Ht '5\' 6"'  (1.676 m)  Wt (!) 319 lb (144.7 kg)   BMI 51.49 kg/m   Wt Readings from Last 3 Encounters:  08/09/22 (!) 319 lb (144.7 kg)  05/21/22 (!) 319 lb 6.4 oz (144.9 kg)  05/14/22 (!) 324 lb 3.2 oz (147.1 kg)      Physical Exam- Limited  Constitutional:  Body mass index is 51.49 kg/m. , not in acute distress, normal state of mind    CMP ( most recent) CMP     Component Value Date/Time   NA 142 04/26/2022 1353   K 4.0 04/26/2022 1353   CL 103 04/26/2022 1353   CO2 23 04/26/2022 1353   GLUCOSE 108 (H) 04/26/2022 1353   GLUCOSE 209 (H) 01/30/2019 1320   BUN 11 04/26/2022 1353   CREATININE 1.22 04/26/2022 1353   CALCIUM 9.5 04/26/2022 1353   PROT 6.6 04/26/2022 1353   ALBUMIN 4.1 04/26/2022 1353   AST 21 04/26/2022 1353   ALT 19 04/26/2022 1353   ALKPHOS 58 04/26/2022 1353   BILITOT 0.5 04/26/2022 1353   GFRNONAA 80 12/04/2020 0000   GFRAA 93 12/04/2020 0000   Diabetic Labs (most recent): Lab Results  Component Value Date   HGBA1C 8.9 (A) 08/09/2022   HGBA1C 5.6 05/06/2022   HGBA1C 7.0 01/04/2022   MICROALBUR 10 06/26/2020     Lipid Panel ( most recent) Lipid Panel     Component Value Date/Time   CHOL 122 04/26/2022 1353   TRIG 206 (H) 04/26/2022 1353   HDL 26 (L) 04/26/2022 1353   CHOLHDL 4.7 04/26/2022 1353   CHOLHDL 6.1 04/28/2014 0121   VLDL 61 (H) 04/28/2014 0121   LDLCALC 62 04/26/2022 1353     Assessment & Plan:   1. DM type 2 causing vascular disease ([place])  - Patient has currently uncontrolled symptomatic type 2 DM since  61 years of age.  [name] presents with his CGM which he has not used for the last month.  Reports shows she was 20% time in range, 17% level 1 hyperglycemia, 67% level 2 hyperglycemia.  He did not document hypoglycemia.  Lately, he switched to his meter which he is not using completely.  His point-of-care A1c is 8.9% increasing from 5.6% during his last  presentation.     -his diabetes is complicated by coronary artery disease, CVA, obesity/sedentary life, chronic heavy smoking and [name] remains at extremely high risk  for more acute and chronic complications which include CAD, CVA, CKD, retinopathy, and neuropathy. These are all discussed in detail with the patient.  - I have counseled him on diet management and weight loss, by adopting a carbohydrate restricted/protein rich diet.  - he acknowledges that there is a room for improvement in his food and drink choices. - Suggestion is made for him to avoid simple carbohydrates  from his diet including Cakes, Sweet Desserts, Ice Cream, Soda (diet and regular), Sweet Tea, Candies, Chips, Cookies, Store Bought Juices, Alcohol in Excess of  1-2 drinks a day, Artificial Sweeteners,  Coffee Creamer, and "Sugar-free" Products, Lemonade. This will help patient to have more stable blood glucose profile and potentially avoid unintended weight gain. - I encouraged him to switch to  unprocessed or minimally processed complex starch and increased protein intake (animal or plant source), fruits, and vegetables.   Whole food, plant-based diet was discussed in detail with him.  - he is advised to stick to a routine mealtimes to eat 3 meals  a day and avoid unnecessary snacks ( to snack only  to correct hypoglycemia).   - I have approached him with the following individualized plan to manage diabetes and patient agrees:   -He has disengaged from self-care recently.  He is not using his CGM properly. -Priority is still to avoid hypoglycemia in this patient.    -He is advised to increase his U500  to 80 units 3 times a day for P[place]al blood glucose readings above 90 mg per DL,  associated with strict monitoring of blood glucose 4 times a day-before meals and at bedtime, using his CGM continuously. -He is advised to use his meter as a backup when he has problem with his CGM.  - He does not tolerate  metformin due to GI side effects.  -He has benefited from low-dose Trulicity.  He is advised to continue Trulicity 1.5 mg subcutaneously weekly.    -Patient is encouraged to call clinic for blood glucose levels less than 70 or above 200 mg /dl.   - Patient specific target  A1c;  LDL, HDL, Triglycerides,  were discussed in detail.  2) BP/HTN:  -His blood pressure is controlled to target. The patient was counseled on the dangers of tobacco use, and was advised to quit.  Reviewed strategies to maximize success, including removing cigarettes and smoking materials from environment.  He is currently on lisinopril 10 mg p.o. daily.    3) Lipids/HPL: He has had history of severe hypertriglyceridemia, recently improving to 230 from 469, mainly associated with improvement in his glycemic profile.  LDL is controlled at 61.  He is advised to continue Trilipix 135 mg p.o. nightly, continue Crestor 20 mg p.o. nightly, fenofibrate 145 mg p.o. nightly.       4)  Weight/Diet: His BMI is 64.15- -clearly complicating his diabetes care.  He is a candidate for modest weight loss.  CDE Consult has been  initiated , exercise, and detailed carbohydrates information provided.  5) vitamin D deficiency: He is on ongoing supplement with vitamin D2 50,000 units weekly for 12 weeks.  6) Chronic Care/Health Maintenance:  -he  is on ACEI/ARB and Statin medications and  is encouraged to continue to follow up with Ophthalmology, Dentist,  Podiatrist at least yearly or according to recommendations, and advised to  quit smoking. I have recommended yearly flu vaccine and pneumonia vaccination at least every 5 years; moderate intensity exercise for up to 150 minutes weekly; and  sleep for at least 7 hours a day. The patient was counseled on the dangers of tobacco use, and was advised to quit.  Reviewed strategies to maximize success, including removing cigarettes and smoking materials from environment.   - I advised patient  to maintain close follow up with Sharilyn Sites, MD for primary care needs.   I spent 41 minutes in the care of the patient today including review of labs from [facility], Lipids, Thyroid Function, Hematology (current and previous including abstractions from other facilities); face-to-face time discussing  his blood glucose readings/logs, discussing hypoglycemia and hyperglycemia episodes and symptoms, medications doses, his options of short and long term treatment based on the latest standards of care / guidelines;  discussion about incorporating lifestyle medicine;  and documenting the encounter. Risk reduction counseling performed per USPSTF guidelines to reduce  obesity and cardiovascular risk factors.     Please refer to Patient Instructions for Blood Glucose Monitoring and Insulin/Medications Dosing Guide"  in media tab for additional information. Please  also refer to " Patient Self Inventory" in the Media  tab for reviewed elements of  pertinent patient history.  [name] participated in the discussions, expressed understanding, and voiced agreement with the above plans.  All questions were answered to his satisfaction. he is encouraged to contact clinic should he have any questions or concerns prior to his return visit.    Follow up plan: - Return in about 3 months (around 11/09/2022) for F/U with Pre-visit Labs, Meter/CGM/Logs, A1c here.   Glade Lloyd, MD Phone: [phone]  Fax: [phone]   08/09/2022, 3:30 PM   This note was partially dictated with voice recognition software. Similar sounding words can be transcribed inadequately or may not  be corrected upon review.

## 2022-08-09 NOTE — Patient Instructions (Signed)
                                     Advice for Weight Management  -For most of us the best way to lose weight is by diet management. Generally speaking, diet management means consuming less calories intentionally which over time brings about progressive weight loss.  This can be achieved more effectively by avoiding ultra processed carbohydrates, processed meats, unhealthy fats.    It is critically important to know your numbers: how much calorie you are consuming and how much calorie you need. More importantly, our carbohydrates sources should be unprocessed naturally occurring  complex starch food items.  It is always important to balance nutrition also by  appropriate intake of proteins (mainly plant-based), healthy fats/oils, plenty of fruits and vegetables.   -The American College of Lifestyle Medicine (ACL M) recommends nutrition derived mostly from Whole Food, Plant Predominant Sources example an apple instead of applesauce or apple pie. Eat Plenty of vegetables, Mushrooms, fruits, Legumes, Whole Grains, Nuts, seeds in lieu of processed meats, processed snacks/pastries red meat, poultry, eggs.  Use only water or unsweetened tea for hydration.  The College also recommends the need to stay away from risky substances including alcohol, smoking; obtaining 7-9 hours of restorative sleep, at least 150 minutes of moderate intensity exercise weekly, importance of healthy social connections, and being mindful of stress and seek help when it is overwhelming.    -Sticking to a routine mealtime to eat 3 meals a day and avoiding unnecessary snacks is shown to have a big role in weight control. Under normal circumstances, the only time we burn stored energy is when we are hungry, so allow  some hunger to take place- hunger means no food between appropriate meal times, only water.  It is not advisable to starve.   -It is better to avoid simple carbohydrates including:  Cakes, Sweet Desserts, Ice Cream, Soda (diet and regular), Sweet Tea, Candies, Chips, Cookies, Store Bought Juices, Alcohol in Excess of  1-2 drinks a day, Lemonade,  Artificial Sweeteners, Doughnuts, Coffee Creamers, "Sugar-free" Products, etc, etc.  This is not a complete list.....    -Consulting with certified diabetes educators is proven to provide you with the most accurate and current information on diet.  Also, you may be  interested in discussing diet options/exchanges , we can schedule a visit with [name], RDN, CDE for individualized nutrition education.  -Exercise: If you are able: 30 -60 minutes a day ,4 days a week, or 150 minutes of moderate intensity exercise weekly.    The longer the better if tolerated.  Combine stretch, strength, and aerobic activities.  If you were told in the past that you have high risk for cardiovascular diseases, or if you are currently symptomatic, you may seek evaluation by your heart doctor prior to initiating moderate to intense exercise programs.                                  Additional Care Considerations for Diabetes/Prediabetes   -Diabetes  is a chronic disease.  The most important care consideration is regular follow-up with your diabetes care provider with the goal being avoiding or delaying its complications and to take advantage of advances in medications and technology.  If appropriate actions are taken early enough, type 2 diabetes can even be   reversed.  Seek information from the right source.  - Whole Food, Plant Predominant Nutrition is highly recommended: Eat Plenty of vegetables, Mushrooms, fruits, Legumes, Whole Grains, Nuts, seeds in lieu of processed meats, processed snacks/pastries red meat, poultry, eggs as recommended by American College of  Lifestyle Medicine (ACLM).  -Type 2 diabetes is known to coexist with other important comorbidities such as high blood pressure and high cholesterol.  It is critical to control not only the  diabetes but also the high blood pressure and high cholesterol to minimize and delay the risk of complications including coronary artery disease, stroke, amputations, blindness, etc.  The good news is that this diet recommendation for type 2 diabetes is also very helpful for managing high cholesterol and high blood blood pressure.  - Studies showed that people with diabetes will benefit from a class of medications known as ACE inhibitors and statins.  Unless there are specific reasons not to be on these medications, the standard of care is to consider getting one from these groups of medications at an optimal doses.  These medications are generally considered safe and proven to help protect the heart and the kidneys.    - People with diabetes are encouraged to initiate and maintain regular follow-up with eye doctors, foot doctors, dentists , and if necessary heart and kidney doctors.     - It is highly recommended that people with diabetes quit smoking or stay away from smoking, and get yearly  flu vaccine and pneumonia vaccine at least every 5 years.  See above for additional recommendations on exercise, sleep, stress management , and healthy social connections.      

## 2022-08-13 ENCOUNTER — Ambulatory Visit (HOSPITAL_COMMUNITY)
Admission: RE | Admit: 2022-08-13 | Discharge: 2022-08-13 | Disposition: A | Discharge status: Home / Self Care | Payer: Medicare HMO | Source: Ambulatory Visit | Attending: Family Medicine | Admitting: Family Medicine

## 2022-08-13 DIAGNOSIS — R918 Other nonspecific abnormal finding of lung field: Secondary | ICD-10-CM | POA: Diagnosis not present

## 2022-08-13 DIAGNOSIS — J439 Emphysema, unspecified: Secondary | ICD-10-CM | POA: Diagnosis not present

## 2022-08-16 DIAGNOSIS — E1151 Type 2 diabetes mellitus with diabetic peripheral angiopathy without gangrene: Secondary | ICD-10-CM | POA: Diagnosis not present

## 2022-08-17 ENCOUNTER — Telehealth: Payer: Self-pay | Admitting: *Deleted

## 2022-08-17 DIAGNOSIS — E1165 Type 2 diabetes mellitus with hyperglycemia: Secondary | ICD-10-CM | POA: Diagnosis not present

## 2022-08-17 NOTE — Patient Outreach (Signed)
  Care Coordination   08/17/2022 Name: FINNIGAN WARRINER MRN: [id number] DOB: 08-24-1961   Care Coordination Outreach Attempts:  A second unsuccessful outreach was attempted today to offer the patient with information about available care coordination services as a benefit of their health plan.     Follow Up Plan:  Additional outreach attempts will be made to offer the patient care coordination information and services.   Encounter Outcome:  No Answer  Care Coordination Interventions Activated:  No   Care Coordination Interventions:  No, not indicated    Valente David, RN, MSN, [facility] Care Management Care Management Coordinator [phone]

## 2022-08-20 ENCOUNTER — Encounter: Payer: Self-pay | Admitting: *Deleted

## 2022-08-20 ENCOUNTER — Telehealth: Payer: Self-pay | Admitting: *Deleted

## 2022-08-20 DIAGNOSIS — E1169 Type 2 diabetes mellitus with other specified complication: Secondary | ICD-10-CM

## 2022-08-20 NOTE — Patient Outreach (Signed)
  Care Coordination   Initial Visit Note   08/20/2022 Name: [name] MRN: [id number] DOB: [birth year]  [name] is a 61 y.o. year old male who sees Sharilyn Sites, MD for primary care. I spoke with  [name] by phone today.  What matters to the patients health and wellness today?  State he has "good days and bad days."  Feels a little better since change in insulin, but waiting on results from CT for possibly more lung nodules (history of lung cancer).    Goals Addressed             This Visit's Progress    Adequate Management of DM       Care Coordination Interventions: Provided education to patient about basic DM disease process Reviewed medications with patient and discussed importance of medication adherence Counseled on importance of regular laboratory monitoring as prescribed Discussed plans with patient for ongoing care management follow up and provided patient with direct contact information for care management team Reviewed scheduled/upcoming provider appointments including: ENT on 10/24, Pulmonary on 10/31, Endocrinology on 1/9.  AWV with PCP was done on 10/4 Advised patient, providing education and rationale, to check cbg 3 times a day and record, calling provider for findings outside established parameters Referral made to community resources care guide team for assistance with food and utility insecurity Assessed social determinant of health barriers Reviewed recent A1C (8.9) and goal of less than 7 Reviewed new changes to insulin and glucose readings since change (today was 185, usually range 300s prior to change) Confirmed he has routine eye and foot care         SDOH assessments and interventions completed:  Yes  SDOH Interventions Today    Flowsheet Row Most Recent Value  SDOH Interventions   Food Insecurity Interventions Other (Comment)  [Community resource care guide referral]  Housing Interventions Intervention Not Indicated   Transportation Interventions Intervention Not Indicated  Utilities Interventions Intervention Not Indicated        Care Coordination Interventions Activated:  Yes  Care Coordination Interventions:  Yes, provided   Follow up plan: Follow up call scheduled for 11/14    Encounter Outcome:  Pt. Visit Completed   Valente David, RN, MSN, [place] Care Management Care Management Coordinator [phone]

## 2022-08-20 NOTE — Patient Instructions (Signed)
Visit Information  Thank you for taking time to visit with me today. Please don't hesitate to contact me if I can be of assistance to you before our next scheduled telephone appointment.  Following are the goals we discussed today:  Adhere to diabetic diet and insulin changes Listen for call from community resource care guide  Our next appointment is by telephone on 11/14  Please call the care guide team at [phone] if you need to cancel or reschedule your appointment.   Please call the Suicide and Crisis Lifeline: 988 call the [place] National Suicide Prevention Lifeline: 760 403 1106 or TTY: 365-342-5194 TTY 7874567785) to talk to a trained counselor call 1-800-273-TALK (toll free, 24 hour hotline) call the [facility]: 712-844-6022 call 911 if you are experiencing a Mental Health or [place] or need someone to talk to.  The patient verbalized understanding of instructions, educational materials, and care plan provided today and agreed to receive a mailed copy of patient instructions, educational materials, and care plan.   The patient has been provided with contact information for the care management team and has been advised to call with any health related questions or concerns.   Valente David, RN, MSN, [place] Care Management Care Management Coordinator [phone]

## 2022-08-23 ENCOUNTER — Telehealth: Payer: Self-pay

## 2022-08-23 NOTE — Telephone Encounter (Signed)
   Telephone encounter was:  Successful.  08/23/2022 Name: [name] MRN: [id number] DOB: May 12, 1961  [name] is a 61 y.o. year old male who is a primary care patient of Sharilyn Sites, MD . The community resource team was consulted for assistance with Food Insecurity and Financial Difficulties related to financial strain  Care guide performed the following interventions: Patient provided with information about care guide support team and interviewed to confirm resource needs.Patient has cancer and had several heart attacks. Patient lives alone and cant afford food and utilities due to financial strain. I have added a referral for [place] care [facility] and mailed resources  Follow Up Plan:  No further follow up planned at this time. The patient has been provided with needed resources.    [facility], Care Management  [phone] [address], [place], [place] [address] Phone: [phone] Email: [email or web address]

## 2022-08-24 DIAGNOSIS — R070 Pain in throat: Secondary | ICD-10-CM | POA: Diagnosis not present

## 2022-08-24 DIAGNOSIS — K219 Gastro-esophageal reflux disease without esophagitis: Secondary | ICD-10-CM | POA: Diagnosis not present

## 2022-08-24 DIAGNOSIS — J449 Chronic obstructive pulmonary disease, unspecified: Secondary | ICD-10-CM | POA: Diagnosis not present

## 2022-08-24 DIAGNOSIS — G4733 Obstructive sleep apnea (adult) (pediatric): Secondary | ICD-10-CM | POA: Diagnosis not present

## 2022-08-25 NOTE — Progress Notes (Signed)
Please notify patient that his CT chest showed stable to slightly increased scattered nodules. He will need repeat CT in 3-6 months for monitoring. Thanks.

## 2022-08-30 DIAGNOSIS — Z79899 Other long term (current) drug therapy: Secondary | ICD-10-CM | POA: Diagnosis not present

## 2022-08-30 DIAGNOSIS — G4733 Obstructive sleep apnea (adult) (pediatric): Secondary | ICD-10-CM | POA: Diagnosis not present

## 2022-08-30 DIAGNOSIS — G4714 Hypersomnia due to medical condition: Secondary | ICD-10-CM | POA: Diagnosis not present

## 2022-08-30 DIAGNOSIS — E662 Morbid (severe) obesity with alveolar hypoventilation: Secondary | ICD-10-CM | POA: Diagnosis not present

## 2022-08-31 ENCOUNTER — Encounter: Payer: Self-pay | Admitting: Pulmonary Disease

## 2022-08-31 ENCOUNTER — Ambulatory Visit: Payer: Medicare HMO | Admitting: Pulmonary Disease

## 2022-08-31 VITALS — BP 118/80 | HR 78 | Temp 98.0°F | Ht 66.0 in | Wt 316.6 lb

## 2022-08-31 DIAGNOSIS — I10 Essential (primary) hypertension: Secondary | ICD-10-CM | POA: Diagnosis not present

## 2022-08-31 DIAGNOSIS — C3411 Malignant neoplasm of upper lobe, right bronchus or lung: Secondary | ICD-10-CM | POA: Diagnosis not present

## 2022-08-31 DIAGNOSIS — F17200 Nicotine dependence, unspecified, uncomplicated: Secondary | ICD-10-CM | POA: Diagnosis not present

## 2022-08-31 DIAGNOSIS — R69 Illness, unspecified: Secondary | ICD-10-CM | POA: Diagnosis not present

## 2022-08-31 DIAGNOSIS — J441 Chronic obstructive pulmonary disease with (acute) exacerbation: Secondary | ICD-10-CM | POA: Diagnosis not present

## 2022-08-31 DIAGNOSIS — G4733 Obstructive sleep apnea (adult) (pediatric): Secondary | ICD-10-CM

## 2022-08-31 DIAGNOSIS — J449 Chronic obstructive pulmonary disease, unspecified: Secondary | ICD-10-CM

## 2022-08-31 DIAGNOSIS — E1165 Type 2 diabetes mellitus with hyperglycemia: Secondary | ICD-10-CM | POA: Diagnosis not present

## 2022-08-31 MED ORDER — TRELEGY ELLIPTA 100-62.5-25 MCG/ACT IN AEPB: 1.0000 | INHALATION_SPRAY | Freq: Every day | RESPIRATORY_TRACT | 2 refills | Status: DC

## 2022-08-31 NOTE — Assessment & Plan Note (Signed)
He completed SBRT. Ongoing surveillance reveals minimal enlargement of pulmonary nodules largest being 8 mm. We will obtain a 29-month follow-up CT chest in April 2024.  He is at moderate risk for recurrent lung malignancy

## 2022-08-31 NOTE — Assessment & Plan Note (Signed)
Tobacco cessation was discussed

## 2022-08-31 NOTE — Progress Notes (Signed)
   Subjective:    Patient ID: [name], male    DOB: [birth year], 61 y.o.   MRN: [id number]  HPI  61 yo obese smoker  for FU of  COPD and OSA and lung nodules. S/p SBRT 04/2021 for enlarging right upper lobe nodule/lung cancer OSA -on CPAP 8 cm + 3L O2 He smoked more than 40 pack years   PMH -CABG 2008 follows with [facility] cardiology,  diabetes, insulin requiring , PAF on eliquis Cor angio 03/2020  2/4 grafts patent, LIMA to LAD and SVG to Ramus. Occluded SVG to RCA, occluded SVG to OM. Both of these are old findings nml LVEF   Chief Complaint  Patient presents with   Follow-up    He is doing well and switching his CPAP to our office. He has had his oxygen machine for a long time and needs a new one.    6 month FU visit He continues to smoke half to 1 pack/day. He requests a new oxygen machine He is compliant with Trelegy. His sleep physician is retiring and would like to switch to [place].  He brings in a note from has nurse practitioner requesting [place] to take over prescribing modafinil for residual hypersomnia related to OSA We also reviewed CT chest  Significant tests/ events reviewed   CT chest wo con 08/2022 >> Paraseptal and centrilobular emphysema, Scattered pulmonary nodules bilaterally, stable to slightly increased in size from the prior exam. The largest nodule measures 53mm.  CT chest without con 09/2021 -decreased size of right upper lobe nodule to 7 mm other nodules are stable.   CT chest wo con 03/2021 >> subsolid 1.5 x 1.5 cm nodule RUL increasing in size & density , other nodules stable   PFTs 06/2020 moderate airway obstruction with ratio of 70, FEV1 1.90/51%, FVC 2.70/55%, no bronchodilator response, TLC 76%, DLCO 16.5/58%   PET 09/2020 >> SUV 2.1   CT chest 06/2020 RUL GG 12 x 10 mmnodule     CT chest 12/2018 advanced changes of emphysema , multiple noncalcified nodules unchanged compared to 09/2018, largest right upper lobe 9 mm slight increased compared to  12/2016   NPSG 11/2007 mild OSA 5.7/hour 07/2015 NPSG AHI 16/h   06/2016 CPAP titration-2 9 7  pounds, no O2 required CPAP 8cm  Review of Systems neg for any significant sore throat, dysphagia, itching, sneezing, nasal congestion or excess/ purulent secretions, fever, chills, sweats, unintended wt loss, pleuritic or exertional cp, hempoptysis, orthopnea pnd or change in chronic leg swelling. Also denies presyncope, palpitations, heartburn, abdominal pain, nausea, vomiting, diarrhea or change in bowel or urinary habits, dysuria,hematuria, rash, arthralgias, visual complaints, headache, numbness weakness or ataxia.     Objective:   Physical Exam  Gen. Pleasant, obese, in no distress ENT - no lesions, no post nasal drip Neck: No JVD, no thyromegaly, no carotid bruits Lungs: no use of accessory muscles, no dullness to percussion, decreased without rales or rhonchi  Cardiovascular: Rhythm regular, heart sounds  normal, no murmurs or gallops, no peripheral edema Musculoskeletal: No deformities, no cyanosis or clubbing , no tremors       Assessment & Plan:    Chronic respiratory failure with hypoxia -he requests filters for his oxygen concentrator and we will ask DME to provide

## 2022-08-31 NOTE — Assessment & Plan Note (Signed)
Continue Trelegy. Use albuterol for rescue. We discussed COPD action plan and signs and symptoms of COPD exacerbation

## 2022-08-31 NOTE — Patient Instructions (Signed)
  X CT chest  wo con in April 2024  X refills on trelegy  [employer] for filters

## 2022-08-31 NOTE — Assessment & Plan Note (Signed)
He is compliant with CPAP and this is certainly helped improve his daytime somnolence and fatigue. He will continue modafinil 200 mg twice daily for persistent hypersomnolence in spite of CPAP use  Weight loss encouraged, compliance with goal of at least 4-6 hrs every night is the expectation. Advised against medications with sedative side effects Cautioned against driving when sleepy - understanding that sleepiness will vary on a day to day basis

## 2022-09-04 DIAGNOSIS — G2581 Restless legs syndrome: Secondary | ICD-10-CM | POA: Diagnosis not present

## 2022-09-04 DIAGNOSIS — J449 Chronic obstructive pulmonary disease, unspecified: Secondary | ICD-10-CM | POA: Diagnosis not present

## 2022-09-04 DIAGNOSIS — J9611 Chronic respiratory failure with hypoxia: Secondary | ICD-10-CM | POA: Diagnosis not present

## 2022-09-04 DIAGNOSIS — G4733 Obstructive sleep apnea (adult) (pediatric): Secondary | ICD-10-CM | POA: Diagnosis not present

## 2022-09-14 ENCOUNTER — Encounter: Payer: Self-pay | Admitting: *Deleted

## 2022-09-14 ENCOUNTER — Telehealth: Payer: Self-pay | Admitting: *Deleted

## 2022-09-14 NOTE — Telephone Encounter (Signed)
  Care Coordination Note  09/14/2022 Name: [name] MRN: [id number] DOB: [birth year]  [name] is a 61 y.o. year old male who is a primary care patient of Sharilyn Sites, MD and is actively engaged with the care management team. I reached out to [name] by phone today to assist with re-scheduling a follow up visit with the RN Case Manager  Follow up plan: Telephone appointment with care management team member scheduled for:09/17/22  [place]  Direct Dial: [phone]

## 2022-09-14 NOTE — Progress Notes (Signed)
  Care Coordination Note  09/14/2022 Name: [name] MRN: [id number] DOB: [birth year]  [name] is a 61 y.o. year old male who is a primary care patient of Sharilyn Sites, MD and is actively engaged with the care management team. I reached out to [name] by phone today to assist with re-scheduling a follow up visit with the RN Case Manager  Follow up plan: Unsuccessful telephone outreach attempt made. A HIPAA compliant phone message was left for the patient providing contact information and requesting a return call.   [place]  Direct Dial: [phone]

## 2022-09-15 ENCOUNTER — Telehealth: Payer: Self-pay | Admitting: *Deleted

## 2022-09-17 ENCOUNTER — Ambulatory Visit: Payer: Self-pay | Admitting: *Deleted

## 2022-09-17 DIAGNOSIS — E1165 Type 2 diabetes mellitus with hyperglycemia: Secondary | ICD-10-CM | POA: Diagnosis not present

## 2022-09-17 NOTE — Patient Outreach (Signed)
  Care Coordination   09/17/2022 Name: [name] MRN: [id number] DOB: [birth year]   Care Coordination Outreach Attempts:  An unsuccessful telephone outreach was attempted for a scheduled appointment today.  Follow Up Plan:  Additional outreach attempts will be made to offer the patient care coordination information and services.   Encounter Outcome:  No Answer  Care Coordination Interventions Activated:  No   Care Coordination Interventions:  No, not indicated    Chong Sicilian, BSN, RN-BC RN Care Coordinator [place]: [phone] Main #: [phone]

## 2022-09-24 DIAGNOSIS — G4733 Obstructive sleep apnea (adult) (pediatric): Secondary | ICD-10-CM | POA: Diagnosis not present

## 2022-09-24 DIAGNOSIS — J449 Chronic obstructive pulmonary disease, unspecified: Secondary | ICD-10-CM | POA: Diagnosis not present

## 2022-09-27 DIAGNOSIS — E538 Deficiency of other specified B group vitamins: Secondary | ICD-10-CM | POA: Diagnosis not present

## 2022-09-30 DIAGNOSIS — E1165 Type 2 diabetes mellitus with hyperglycemia: Secondary | ICD-10-CM | POA: Diagnosis not present

## 2022-09-30 DIAGNOSIS — J449 Chronic obstructive pulmonary disease, unspecified: Secondary | ICD-10-CM | POA: Diagnosis not present

## 2022-09-30 DIAGNOSIS — I10 Essential (primary) hypertension: Secondary | ICD-10-CM | POA: Diagnosis not present

## 2022-10-04 DIAGNOSIS — G2581 Restless legs syndrome: Secondary | ICD-10-CM | POA: Diagnosis not present

## 2022-10-04 DIAGNOSIS — G4733 Obstructive sleep apnea (adult) (pediatric): Secondary | ICD-10-CM | POA: Diagnosis not present

## 2022-10-04 DIAGNOSIS — J449 Chronic obstructive pulmonary disease, unspecified: Secondary | ICD-10-CM | POA: Diagnosis not present

## 2022-10-04 DIAGNOSIS — J9611 Chronic respiratory failure with hypoxia: Secondary | ICD-10-CM | POA: Diagnosis not present

## 2022-10-06 ENCOUNTER — Telehealth: Payer: Self-pay | Admitting: *Deleted

## 2022-10-06 NOTE — Progress Notes (Signed)
  Care Coordination Note  10/06/2022 Name: [name] MRN: [id number] DOB: 1961/04/18  [name] is a 61 y.o. year old male who is a primary care patient of Sharilyn Sites, MD and is actively engaged with the care management team. I reached out to [name] by phone today to assist with re-scheduling a follow up visit with the RN Case Manager  Follow up plan: Unsuccessful telephone outreach attempt made. A HIPAA compliant phone message was left for the patient providing contact information and requesting a return call.   [place]  Direct Dial: [phone]

## 2022-10-12 ENCOUNTER — Inpatient Hospital Stay (HOSPITAL_COMMUNITY)
Admission: EM | Admit: 2022-10-12 | Discharge: 2022-10-14 | DRG: 177 | Disposition: A | Discharge status: Home / Self Care | Payer: Medicare HMO | Attending: Family Medicine | Admitting: Family Medicine

## 2022-10-12 ENCOUNTER — Other Ambulatory Visit: Payer: Self-pay

## 2022-10-12 ENCOUNTER — Emergency Department (HOSPITAL_COMMUNITY): Payer: Medicare HMO

## 2022-10-12 ENCOUNTER — Encounter (HOSPITAL_COMMUNITY): Payer: Self-pay

## 2022-10-12 DIAGNOSIS — Z794 Long term (current) use of insulin: Secondary | ICD-10-CM | POA: Diagnosis not present

## 2022-10-12 DIAGNOSIS — G4733 Obstructive sleep apnea (adult) (pediatric): Secondary | ICD-10-CM | POA: Diagnosis not present

## 2022-10-12 DIAGNOSIS — E1122 Type 2 diabetes mellitus with diabetic chronic kidney disease: Secondary | ICD-10-CM | POA: Diagnosis present

## 2022-10-12 DIAGNOSIS — F32A Depression, unspecified: Secondary | ICD-10-CM | POA: Diagnosis not present

## 2022-10-12 DIAGNOSIS — H269 Unspecified cataract: Secondary | ICD-10-CM | POA: Diagnosis present

## 2022-10-12 DIAGNOSIS — Z6841 Body Mass Index (BMI) 40.0 and over, adult: Secondary | ICD-10-CM

## 2022-10-12 DIAGNOSIS — J1282 Pneumonia due to coronavirus disease 2019: Secondary | ICD-10-CM | POA: Diagnosis present

## 2022-10-12 DIAGNOSIS — I5032 Chronic diastolic (congestive) heart failure: Secondary | ICD-10-CM | POA: Diagnosis present

## 2022-10-12 DIAGNOSIS — I639 Cerebral infarction, unspecified: Secondary | ICD-10-CM | POA: Diagnosis present

## 2022-10-12 DIAGNOSIS — F419 Anxiety disorder, unspecified: Secondary | ICD-10-CM | POA: Diagnosis present

## 2022-10-12 DIAGNOSIS — E1100 Type 2 diabetes mellitus with hyperosmolarity without nonketotic hyperglycemic-hyperosmolar coma (NKHHC): Secondary | ICD-10-CM | POA: Insufficient documentation

## 2022-10-12 DIAGNOSIS — N181 Chronic kidney disease, stage 1: Secondary | ICD-10-CM | POA: Diagnosis present

## 2022-10-12 DIAGNOSIS — I10 Essential (primary) hypertension: Secondary | ICD-10-CM | POA: Diagnosis present

## 2022-10-12 DIAGNOSIS — I5030 Unspecified diastolic (congestive) heart failure: Secondary | ICD-10-CM | POA: Diagnosis present

## 2022-10-12 DIAGNOSIS — J9611 Chronic respiratory failure with hypoxia: Secondary | ICD-10-CM | POA: Insufficient documentation

## 2022-10-12 DIAGNOSIS — J441 Chronic obstructive pulmonary disease with (acute) exacerbation: Secondary | ICD-10-CM | POA: Diagnosis not present

## 2022-10-12 DIAGNOSIS — I480 Paroxysmal atrial fibrillation: Secondary | ICD-10-CM | POA: Diagnosis present

## 2022-10-12 DIAGNOSIS — I130 Hypertensive heart and chronic kidney disease with heart failure and stage 1 through stage 4 chronic kidney disease, or unspecified chronic kidney disease: Secondary | ICD-10-CM | POA: Diagnosis not present

## 2022-10-12 DIAGNOSIS — R0602 Shortness of breath: Secondary | ICD-10-CM | POA: Diagnosis present

## 2022-10-12 DIAGNOSIS — Z9981 Dependence on supplemental oxygen: Secondary | ICD-10-CM

## 2022-10-12 DIAGNOSIS — R079 Chest pain, unspecified: Secondary | ICD-10-CM | POA: Diagnosis not present

## 2022-10-12 DIAGNOSIS — Z28310 Unvaccinated for covid-19: Secondary | ICD-10-CM

## 2022-10-12 DIAGNOSIS — Z7901 Long term (current) use of anticoagulants: Secondary | ICD-10-CM

## 2022-10-12 DIAGNOSIS — Z86010 Personal history of colonic polyps: Secondary | ICD-10-CM

## 2022-10-12 DIAGNOSIS — N189 Chronic kidney disease, unspecified: Secondary | ICD-10-CM | POA: Diagnosis present

## 2022-10-12 DIAGNOSIS — J432 Centrilobular emphysema: Secondary | ICD-10-CM | POA: Diagnosis not present

## 2022-10-12 DIAGNOSIS — K2100 Gastro-esophageal reflux disease with esophagitis, without bleeding: Secondary | ICD-10-CM | POA: Diagnosis present

## 2022-10-12 DIAGNOSIS — Z923 Personal history of irradiation: Secondary | ICD-10-CM

## 2022-10-12 DIAGNOSIS — E782 Mixed hyperlipidemia: Secondary | ICD-10-CM | POA: Diagnosis not present

## 2022-10-12 DIAGNOSIS — Z79899 Other long term (current) drug therapy: Secondary | ICD-10-CM

## 2022-10-12 DIAGNOSIS — E876 Hypokalemia: Secondary | ICD-10-CM | POA: Insufficient documentation

## 2022-10-12 DIAGNOSIS — J440 Chronic obstructive pulmonary disease with acute lower respiratory infection: Secondary | ICD-10-CM | POA: Diagnosis present

## 2022-10-12 DIAGNOSIS — U071 COVID-19: Principal | ICD-10-CM | POA: Diagnosis present

## 2022-10-12 DIAGNOSIS — J9621 Acute and chronic respiratory failure with hypoxia: Secondary | ICD-10-CM | POA: Diagnosis not present

## 2022-10-12 DIAGNOSIS — R0789 Other chest pain: Secondary | ICD-10-CM | POA: Diagnosis not present

## 2022-10-12 DIAGNOSIS — K2270 Barrett's esophagus without dysplasia: Secondary | ICD-10-CM | POA: Diagnosis present

## 2022-10-12 DIAGNOSIS — E1165 Type 2 diabetes mellitus with hyperglycemia: Secondary | ICD-10-CM | POA: Diagnosis present

## 2022-10-12 DIAGNOSIS — Z743 Need for continuous supervision: Secondary | ICD-10-CM | POA: Diagnosis not present

## 2022-10-12 DIAGNOSIS — R69 Illness, unspecified: Secondary | ICD-10-CM | POA: Diagnosis not present

## 2022-10-12 DIAGNOSIS — J80 Acute respiratory distress syndrome: Secondary | ICD-10-CM | POA: Diagnosis not present

## 2022-10-12 DIAGNOSIS — Z8673 Personal history of transient ischemic attack (TIA), and cerebral infarction without residual deficits: Secondary | ICD-10-CM

## 2022-10-12 DIAGNOSIS — T380X5A Adverse effect of glucocorticoids and synthetic analogues, initial encounter: Secondary | ICD-10-CM | POA: Diagnosis present

## 2022-10-12 DIAGNOSIS — Z8249 Family history of ischemic heart disease and other diseases of the circulatory system: Secondary | ICD-10-CM

## 2022-10-12 DIAGNOSIS — Z955 Presence of coronary angioplasty implant and graft: Secondary | ICD-10-CM

## 2022-10-12 DIAGNOSIS — R0603 Acute respiratory distress: Principal | ICD-10-CM

## 2022-10-12 DIAGNOSIS — I252 Old myocardial infarction: Secondary | ICD-10-CM

## 2022-10-12 DIAGNOSIS — I2510 Atherosclerotic heart disease of native coronary artery without angina pectoris: Secondary | ICD-10-CM | POA: Diagnosis present

## 2022-10-12 DIAGNOSIS — F17210 Nicotine dependence, cigarettes, uncomplicated: Secondary | ICD-10-CM | POA: Diagnosis present

## 2022-10-12 DIAGNOSIS — J90 Pleural effusion, not elsewhere classified: Secondary | ICD-10-CM | POA: Diagnosis not present

## 2022-10-12 DIAGNOSIS — Z8719 Personal history of other diseases of the digestive system: Secondary | ICD-10-CM

## 2022-10-12 DIAGNOSIS — R0902 Hypoxemia: Secondary | ICD-10-CM | POA: Diagnosis not present

## 2022-10-12 DIAGNOSIS — K219 Gastro-esophageal reflux disease without esophagitis: Secondary | ICD-10-CM | POA: Diagnosis present

## 2022-10-12 DIAGNOSIS — Z87442 Personal history of urinary calculi: Secondary | ICD-10-CM

## 2022-10-12 DIAGNOSIS — Z833 Family history of diabetes mellitus: Secondary | ICD-10-CM

## 2022-10-12 DIAGNOSIS — Z85118 Personal history of other malignant neoplasm of bronchus and lung: Secondary | ICD-10-CM

## 2022-10-12 DIAGNOSIS — Z800 Family history of malignant neoplasm of digestive organs: Secondary | ICD-10-CM

## 2022-10-12 DIAGNOSIS — Z951 Presence of aortocoronary bypass graft: Secondary | ICD-10-CM

## 2022-10-12 DIAGNOSIS — R0609 Other forms of dyspnea: Secondary | ICD-10-CM | POA: Diagnosis present

## 2022-10-12 DIAGNOSIS — Z91041 Radiographic dye allergy status: Secondary | ICD-10-CM

## 2022-10-12 DIAGNOSIS — E669 Obesity, unspecified: Secondary | ICD-10-CM | POA: Diagnosis present

## 2022-10-12 LAB — TROPONIN I (HIGH SENSITIVITY)
Troponin I (High Sensitivity): 7 ng/L (ref <18)
Troponin I (High Sensitivity): 7 ng/L (ref <18)

## 2022-10-12 LAB — BASIC METABOLIC PANEL
Anion gap: 14 (ref 5–15)
BUN: 11 mg/dL (ref 6–20)
CO2: 23 mmol/L (ref 22–32)
Calcium: 8.4 mg/dL — ABNORMAL LOW (ref 8.9–10.3)
Chloride: 104 mmol/L (ref 98–111)
Creatinine, Ser: 1.17 mg/dL (ref 0.61–1.24)
GFR, Estimated: >60 mL/min (ref >60)
Glucose, Bld: 143 mg/dL — ABNORMAL HIGH (ref 70–99)
Potassium: 3.1 mmol/L — ABNORMAL LOW (ref 3.5–5.1)
Sodium: 141 mmol/L (ref 135–145)

## 2022-10-12 LAB — PROCALCITONIN: Procalcitonin: <0.1 ng/mL

## 2022-10-12 LAB — RESP PANEL BY RT-PCR (RSV, FLU A&B, COVID)  RVPGX2
Influenza A by PCR: NEGATIVE
Influenza B by PCR: NEGATIVE
Resp Syncytial Virus by PCR: NEGATIVE
SARS Coronavirus 2 by RT PCR: POSITIVE — AB

## 2022-10-12 LAB — CBC
HCT: 46 % (ref 39.0–52.0)
Hemoglobin: 15.6 g/dL (ref 13.0–17.0)
MCH: 31.8 pg (ref 26.0–34.0)
MCHC: 33.9 g/dL (ref 30.0–36.0)
MCV: 93.9 fL (ref 80.0–100.0)
Platelets: 205 10*3/uL (ref 150–400)
RBC: 4.9 MIL/uL (ref 4.22–5.81)
RDW: 12.7 % (ref 11.5–15.5)
WBC: 3.7 10*3/uL — ABNORMAL LOW (ref 4.0–10.5)
nRBC: 0 % (ref 0.0–0.2)

## 2022-10-12 LAB — GLUCOSE, CAPILLARY
Glucose-Capillary: 274 mg/dL — ABNORMAL HIGH (ref 70–99)
Glucose-Capillary: 298 mg/dL — ABNORMAL HIGH (ref 70–99)

## 2022-10-12 LAB — HIV ANTIBODY (ROUTINE TESTING W REFLEX): HIV Screen 4th Generation wRfx: NONREACTIVE

## 2022-10-12 LAB — D-DIMER, QUANTITATIVE: D-Dimer, Quant: 0.34 ug/mL-FEU (ref 0.00–0.50)

## 2022-10-12 LAB — BRAIN NATRIURETIC PEPTIDE: B Natriuretic Peptide: 19 pg/mL (ref 0.0–100.0)

## 2022-10-12 MED ORDER — DIAZEPAM 5 MG PO TABS
5.0000 mg | ORAL_TABLET | Freq: Two times a day (BID) | ORAL | Status: DC
  Administered 2022-10-12 – 2022-10-14 (×4): 5 mg via ORAL
  Filled 2022-10-12 (×4): qty 1

## 2022-10-12 MED ORDER — ARFORMOTEROL TARTRATE 15 MCG/2ML IN NEBU
15.0000 ug | INHALATION_SOLUTION | Freq: Two times a day (BID) | RESPIRATORY_TRACT | Status: DC
  Administered 2022-10-12 – 2022-10-14 (×5): 15 ug via RESPIRATORY_TRACT
  Filled 2022-10-12 (×5): qty 2

## 2022-10-12 MED ORDER — NIRMATRELVIR/RITONAVIR (PAXLOVID)TABLET
3.0000 | ORAL_TABLET | Freq: Two times a day (BID) | ORAL | Status: DC
  Administered 2022-10-12 – 2022-10-13 (×4): 3 via ORAL
  Filled 2022-10-12: qty 30

## 2022-10-12 MED ORDER — ZINC SULFATE 220 (50 ZN) MG PO CAPS
220.0000 mg | ORAL_CAPSULE | Freq: Every day | ORAL | Status: DC
  Administered 2022-10-12 – 2022-10-14 (×3): 220 mg via ORAL
  Filled 2022-10-12 (×3): qty 1

## 2022-10-12 MED ORDER — NITROGLYCERIN 0.4 MG SL SUBL: 0.4000 mg | SUBLINGUAL_TABLET | SUBLINGUAL | Status: DC | PRN

## 2022-10-12 MED ORDER — PANTOPRAZOLE SODIUM 40 MG PO TBEC
40.0000 mg | DELAYED_RELEASE_TABLET | Freq: Every day | ORAL | Status: DC
  Administered 2022-10-13 – 2022-10-14 (×2): 40 mg via ORAL
  Filled 2022-10-12 (×2): qty 1

## 2022-10-12 MED ORDER — ALBUTEROL SULFATE (2.5 MG/3ML) 0.083% IN NEBU: 2.5000 mg | INHALATION_SOLUTION | RESPIRATORY_TRACT | Status: DC | PRN

## 2022-10-12 MED ORDER — METOPROLOL TARTRATE 50 MG PO TABS
100.0000 mg | ORAL_TABLET | Freq: Two times a day (BID) | ORAL | Status: DC
  Administered 2022-10-12 – 2022-10-14 (×4): 100 mg via ORAL
  Filled 2022-10-12 (×4): qty 2

## 2022-10-12 MED ORDER — ENOXAPARIN SODIUM 150 MG/ML IJ SOSY
140.0000 mg | PREFILLED_SYRINGE | Freq: Two times a day (BID) | INTRAMUSCULAR | Status: DC
  Administered 2022-10-13 – 2022-10-14 (×3): 140 mg via SUBCUTANEOUS
  Filled 2022-10-12 (×5): qty 1

## 2022-10-12 MED ORDER — TRAZODONE HCL 50 MG PO TABS: 50.0000 mg | ORAL_TABLET | Freq: Every evening | ORAL | Status: DC | PRN

## 2022-10-12 MED ORDER — ACETAMINOPHEN 325 MG PO TABS: 650.0000 mg | ORAL_TABLET | Freq: Four times a day (QID) | ORAL | Status: DC | PRN

## 2022-10-12 MED ORDER — NIRMATRELVIR/RITONAVIR (PAXLOVID)TABLET
3.0000 | ORAL_TABLET | Freq: Two times a day (BID) | ORAL | Status: DC
  Filled 2022-10-12: qty 30

## 2022-10-12 MED ORDER — OXYCODONE HCL 5 MG PO TABS: 5.0000 mg | ORAL_TABLET | ORAL | Status: DC | PRN

## 2022-10-12 MED ORDER — MODAFINIL 200 MG PO TABS
200.0000 mg | ORAL_TABLET | Freq: Two times a day (BID) | ORAL | Status: DC
  Administered 2022-10-12 – 2022-10-14 (×4): 200 mg via ORAL
  Filled 2022-10-12 (×4): qty 1

## 2022-10-12 MED ORDER — BUDESONIDE 0.5 MG/2ML IN SUSP
0.5000 mg | Freq: Two times a day (BID) | RESPIRATORY_TRACT | Status: DC
  Administered 2022-10-12 – 2022-10-14 (×5): 0.5 mg via RESPIRATORY_TRACT
  Filled 2022-10-12 (×6): qty 2

## 2022-10-12 MED ORDER — FLUTICASONE FUROATE-VILANTEROL 100-25 MCG/ACT IN AEPB: 1.0000 | INHALATION_SPRAY | Freq: Every day | RESPIRATORY_TRACT | Status: DC

## 2022-10-12 MED ORDER — SENNA 8.6 MG PO TABS
1.0000 | ORAL_TABLET | Freq: Two times a day (BID) | ORAL | Status: DC
  Administered 2022-10-12 – 2022-10-14 (×4): 8.6 mg via ORAL
  Filled 2022-10-12 (×5): qty 1

## 2022-10-12 MED ORDER — POTASSIUM CHLORIDE CRYS ER 20 MEQ PO TBCR
20.0000 meq | EXTENDED_RELEASE_TABLET | Freq: Two times a day (BID) | ORAL | Status: DC
  Administered 2022-10-12 – 2022-10-14 (×4): 20 meq via ORAL
  Filled 2022-10-12 (×4): qty 1

## 2022-10-12 MED ORDER — INSULIN REGULAR HUMAN (CONC) 500 UNIT/ML ~~LOC~~ SOPN
80.0000 [IU] | PEN_INJECTOR | Freq: Three times a day (TID) | SUBCUTANEOUS | Status: DC
  Administered 2022-10-12 – 2022-10-14 (×6): 80 [IU] via SUBCUTANEOUS
  Filled 2022-10-12: qty 3

## 2022-10-12 MED ORDER — HYDROCOD POLI-CHLORPHE POLI ER 10-8 MG/5ML PO SUER
5.0000 mL | Freq: Two times a day (BID) | ORAL | Status: DC
  Administered 2022-10-12 – 2022-10-14 (×5): 5 mL via ORAL
  Filled 2022-10-12 (×5): qty 5

## 2022-10-12 MED ORDER — ASPIRIN 81 MG PO CHEW
324.0000 mg | CHEWABLE_TABLET | Freq: Once | ORAL | Status: AC
  Administered 2022-10-12: 324 mg via ORAL
  Filled 2022-10-12: qty 4

## 2022-10-12 MED ORDER — METHYLPREDNISOLONE SODIUM SUCC 125 MG IJ SOLR
125.0000 mg | Freq: Once | INTRAMUSCULAR | Status: AC
  Administered 2022-10-12: 125 mg via INTRAVENOUS
  Filled 2022-10-12: qty 2

## 2022-10-12 MED ORDER — UMECLIDINIUM BROMIDE 62.5 MCG/ACT IN AEPB: 1.0000 | INHALATION_SPRAY | Freq: Every day | RESPIRATORY_TRACT | Status: DC

## 2022-10-12 MED ORDER — ENOXAPARIN SODIUM 150 MG/ML IJ SOSY
140.0000 mg | PREFILLED_SYRINGE | Freq: Two times a day (BID) | INTRAMUSCULAR | Status: DC
  Filled 2022-10-12 (×2): qty 1

## 2022-10-12 MED ORDER — REVEFENACIN 175 MCG/3ML IN SOLN
175.0000 ug | Freq: Every day | RESPIRATORY_TRACT | Status: DC
  Administered 2022-10-12 – 2022-10-14 (×3): 175 ug via RESPIRATORY_TRACT
  Filled 2022-10-12 (×4): qty 3

## 2022-10-12 MED ORDER — VITAMIN C 500 MG PO TABS
500.0000 mg | ORAL_TABLET | Freq: Every day | ORAL | Status: DC
  Administered 2022-10-12 – 2022-10-14 (×3): 500 mg via ORAL
  Filled 2022-10-12 (×3): qty 1

## 2022-10-12 MED ORDER — ALBUTEROL (5 MG/ML) CONTINUOUS INHALATION SOLN
10.0000 mg/h | INHALATION_SOLUTION | RESPIRATORY_TRACT | Status: DC
  Administered 2022-10-12: 10 mg/h via RESPIRATORY_TRACT
  Filled 2022-10-12: qty 20

## 2022-10-12 MED ORDER — APIXABAN 5 MG PO TABS: 5.0000 mg | ORAL_TABLET | Freq: Two times a day (BID) | ORAL | Status: DC

## 2022-10-12 MED ORDER — ONDANSETRON HCL 4 MG/2ML IJ SOLN: 4.0000 mg | Freq: Four times a day (QID) | INTRAMUSCULAR | Status: DC | PRN

## 2022-10-12 MED ORDER — PREDNISONE 20 MG PO TABS: 50.0000 mg | ORAL_TABLET | Freq: Every day | ORAL | Status: DC

## 2022-10-12 MED ORDER — GUAIFENESIN-DM 100-10 MG/5ML PO SYRP
10.0000 mL | ORAL_SOLUTION | Freq: Three times a day (TID) | ORAL | Status: DC
  Administered 2022-10-12 – 2022-10-14 (×6): 10 mL via ORAL
  Filled 2022-10-12 (×6): qty 10

## 2022-10-12 MED ORDER — INSULIN ASPART 100 UNIT/ML IJ SOLN
0.0000 [IU] | Freq: Three times a day (TID) | INTRAMUSCULAR | Status: DC
  Administered 2022-10-13: 7 [IU] via SUBCUTANEOUS
  Administered 2022-10-13: 11 [IU] via SUBCUTANEOUS
  Administered 2022-10-13: 4 [IU] via SUBCUTANEOUS
  Administered 2022-10-14: 15 [IU] via SUBCUTANEOUS
  Administered 2022-10-14: 4 [IU] via SUBCUTANEOUS

## 2022-10-12 MED ORDER — ONDANSETRON HCL 4 MG PO TABS: 4.0000 mg | ORAL_TABLET | Freq: Four times a day (QID) | ORAL | Status: DC | PRN

## 2022-10-12 MED ORDER — METHYLPREDNISOLONE SODIUM SUCC 125 MG IJ SOLR
1.0000 mg/kg | Freq: Two times a day (BID) | INTRAMUSCULAR | Status: DC
  Administered 2022-10-12 – 2022-10-14 (×4): 140.625 mg via INTRAVENOUS
  Filled 2022-10-12 (×4): qty 4

## 2022-10-12 MED ORDER — ISOSORBIDE MONONITRATE ER 60 MG PO TB24
120.0000 mg | ORAL_TABLET | Freq: Every day | ORAL | Status: DC
  Administered 2022-10-13 – 2022-10-14 (×2): 120 mg via ORAL
  Filled 2022-10-12 (×2): qty 2

## 2022-10-12 MED ORDER — FUROSEMIDE 40 MG PO TABS
80.0000 mg | ORAL_TABLET | Freq: Every morning | ORAL | Status: DC
  Administered 2022-10-13 – 2022-10-14 (×2): 80 mg via ORAL
  Filled 2022-10-12 (×4): qty 1

## 2022-10-12 MED ORDER — ESCITALOPRAM OXALATE 10 MG PO TABS
20.0000 mg | ORAL_TABLET | Freq: Every day | ORAL | Status: DC
  Administered 2022-10-13 – 2022-10-14 (×2): 20 mg via ORAL
  Filled 2022-10-12 (×2): qty 2

## 2022-10-12 MED ORDER — GABAPENTIN 300 MG PO CAPS
300.0000 mg | ORAL_CAPSULE | Freq: Three times a day (TID) | ORAL | Status: DC
  Administered 2022-10-12 – 2022-10-14 (×6): 300 mg via ORAL
  Filled 2022-10-12 (×7): qty 1

## 2022-10-12 NOTE — ED Notes (Signed)
Pt CBG was 153. Wouldn't cross over on the machine.

## 2022-10-12 NOTE — Assessment & Plan Note (Signed)
-  Atypical chest pain -Intermittent chest pain, denies any chest pain at this time -Atypical, exacerbated by respiratory distress, cough, shortness of breath -Recycle troponin -No changes in EKG  -As needed nitroglycerin, aspirin, continue home medication -

## 2022-10-12 NOTE — Assessment & Plan Note (Signed)
Due to COPD exacerbation, exacerbated in SARS-CoV-2 infection -Treatment as above

## 2022-10-12 NOTE — Assessment & Plan Note (Addendum)
-   Continue supportive therapy, supplemental oxygen, -Consulting pharmacy, assessing patient if he would be a candidate for oral Paxlovid The benefit and initiation of Paxlovid outweighs the risks -Resuming inhaler Trelegy/Ellipta, will substitute

## 2022-10-12 NOTE — Progress Notes (Signed)
ANTICOAGULATION CONSULT NOTE - Initial Consult  Pharmacy Consult for Lovenox Indication: atrial fibrillation  Allergies  Allergen Reactions   Contrast Media [Iodinated Contrast Media] Other (See Comments)    Pt must be premedicated before given contrast media - stops heart   Iohexol Other (See Comments)     Consult with radiologist before pre meds are given.Desc: PT. STATES HEART STOPPED HAS TO BE PREMED.     Patient Measurements: Height: 5\' 6"  (167.6 cm) Weight: (!) 140.6 kg (310 lb) IBW/kg (Calculated) : 63.8  Labs: Recent Labs    10/12/22 1021 10/12/22 1204  HGB 15.6  --   HCT 46.0  --   PLT 205  --   CREATININE 1.17  --   TROPONINIHS 7 7    Estimated Creatinine Clearance: 90.9 mL/min (by C-G formula based on SCr of 1.17 mg/dL).  Assessment: 61 year old male hx mulitple medical problems including COPD, Obesity, Afib admitted for resp failure 2nd Covid. Pharmacy consulted to transition patient to lovenox. Spoke with patient who seemed somewhat confused - initially indicated apixaban was discontinued but then clarified that he was taking it. Prescription refill history unable to corroborate.   Goal of Therapy:  Monitor platelets by anticoagulation protocol: Yes   Plan:  Lovenox 1mg /kg q 12h CBC q 72 hours   Shakaria Raphael Chika 10/12/2022,1:55 PM

## 2022-10-12 NOTE — Assessment & Plan Note (Signed)
-   Monitoring, advised on weight loss healthy diet and exercise Body mass index is 50.04 kg/m.

## 2022-10-12 NOTE — Assessment & Plan Note (Signed)
-   Currently normal sinus on metoprolol -Supposed to be on Eliquis

## 2022-10-12 NOTE — Assessment & Plan Note (Signed)
-   Uncontrolled DM type II last A1c was 8.9, on 08/09/2022 -Rechecking A1c -Checking CBG QA CHS with SSI coverage -On concentrated insulin-will try to mimic and initiate Otherwise will substitute with long-acting insulin

## 2022-10-12 NOTE — Consult Note (Addendum)
NAME:  [name], MRN:  [id number], DOB:  [birth year], LOS: 0 ADMISSION DATE:  10/12/2022, CONSULTATION DATE:  10/12/2022  REFERRING MD:  shahmehdi, CHIEF COMPLAINT: Respiratory distress  History of Present Illness:  61 year old smoker with COPD, chronic respiratory failure and known pulmonary nodule admitted with shortness of breath for 1 to 2 weeks.  He reports increased wheezing, cough productive of white sputum, diaphoresis without fevers.  He reports intermittent substernal chest pain that started 1 week ago, relieved by nitro He reports persistent chest pain for the last 2 days radiating to his left arm, stepson called EMS Placed on 5 L oxygen in ED, given bronchodilator nebs, IV Solu-Medrol, found to be COVID-positive  Pertinent  Medical History  CABG 2008 follows with [facility],  diabetes, insulin requiring ,  PAF on eliquis Cor angio 03/2020  2/4 grafts patent, LIMA to LAD and SVG to Ramus. Occluded SVG to RCA, occluded SVG to OM. Both of these are old findings nml LVEF S/p SBRT 04/2021 for enlarging right upper lobe nodule/lung cancer OSA -on CPAP 8 cm + 3L O2 He smoked more than 40 pack years  COPD - PFTs 06/2020 moderate airway obstruction with ratio of 70, FEV1 1.90/51%, FVC 2.70/55%, no bronchodilator response, TLC 76%, DLCO 16.5/58%  CT chest wo con 08/2022 >> Paraseptal and centrilobular emphysema, Scattered pulmonary nodules bilaterally, stable to slightly increased in size from the prior exam. The largest nodule measures 51m.   Significant Hospital Events: Including procedures, antibiotic start and stop dates in addition to other pertinent events     Interim History / Subjective:  Reports feeling better after treatment in the ED  Objective   Blood pressure 104/68, pulse (!) 106, temperature 98 F (36.7 C), temperature source Oral, resp. rate (!) 26, height 5' 6" (1.676 m), weight (!) 140.6 kg, SpO2 91 %.       No intake or output data in the 24  hours ending 10/12/22 1535 Filed Weights   10/12/22 0956  Weight: (!) 140.6 kg    Examination: General: Morbidly obese man lying supine in ED stretcher, no distress HENT: No pallor, no icterus, short neck, cannot assess JVD Lungs: Decreased breath sounds bilateral, scattered expiratory rhonchi, no accessory muscle use Cardiovascular: S1-S2 distant Abdomen: Soft, obese, no guarding Extremities: No edema, no deformity Neuro: Alert oriented x 3, no focal deficits  Labs show hypokalemia, no leukocytosis PCR positive for COVID Chest x-ray independently reviewed shows interstitial prominence system interstitial pneumonia versus CHF with small bilateral effusions  Resolved Hospital Problem list     Assessment & Plan:  COPD exacerbation likely related to COVID infection.  He is unvaccinated   Acute on chronic hypoxic respiratory failure COPD exacerbation  -Titrate oxygen 90% and above sats, currently requiring 4 L nasal cannula -Use budesonide/Brovana and Yupelri instead of his home Trelegy -IV Solu-Medrol 40 every 12 , expect sugars to rise    COVID-19 positive test (U07.1, COVID-19) with Acute Pneumonia (J12.89, Other viral pneumonia) (If respiratory failure or sepsis present, add as separate assessment)  -Paxlovid and steroids, pharmacy assisting  HFpEF -Check BNP, repeat echo if elevated -Home dose of Lasix 80 mg daily  OSA -resume home CPAP during sleep. [place] request BiPAP but I do not think he will qualify  PCCM will be available as needed, he is at risk for clinical deterioration since he is unvaccinated but hopefully with treatment he will continue to improve  Best Practice (right click and "Reselect all SmartList Selections" daily)  Per [facility]  Labs   CBC: Recent Labs  Lab 10/12/22 1021  WBC 3.7*  HGB 15.6  HCT 46.0  MCV 93.9  PLT 518    Basic Metabolic Panel: Recent Labs  Lab 10/12/22 1021  NA 141  K 3.1*  CL 104  CO2 23  GLUCOSE 143*  BUN  11  CREATININE 1.17  CALCIUM 8.4*   GFR: Estimated Creatinine Clearance: 90.9 mL/min (by C-G formula based on SCr of 1.17 mg/dL). Recent Labs  Lab 10/12/22 1021  WBC 3.7*    Liver Function Tests: No results for input(s): "AST", "ALT", "ALKPHOS", "BILITOT", "PROT", "ALBUMIN" in the last 168 hours. No results for input(s): "LIPASE", "AMYLASE" in the last 168 hours. No results for input(s): "AMMONIA" in the last 168 hours.  ABG    Component Value Date/Time   PHART 7.402 09/19/2007 1651   PCO2ART 35.0 09/19/2007 1651   PO2ART 77.0 (L) 09/18/2007 1708   HCO3 21.8 09/19/2007 1651   TCO2 23 09/19/2007 1651   ACIDBASEDEF 2.0 09/19/2007 1651   O2SAT 94.0 09/18/2007 1708     Coagulation Profile: No results for input(s): "INR", "PROTIME" in the last 168 hours.  Cardiac Enzymes: No results for input(s): "CKTOTAL", "CKMB", "CKMBINDEX", "TROPONINI" in the last 168 hours.  HbA1C: Hemoglobin A1C  Date/Time Value Ref Range Status  06/09/2021 12:00 AM 6.3  Final  10/01/2020 12:00 AM 7.4  Final   HbA1c, POC (controlled diabetic range)  Date/Time Value Ref Range Status  08/09/2022 01:58 PM 8.9 (A) 0.0 - 7.0 % Final  05/06/2022 03:16 PM 5.6 0.0 - 7.0 % Final    CBG: No results for input(s): "GLUCAP" in the last 168 hours.  Review of Systems:   Shortness of breath Cough, white phlegm Chest pain, left arm pain  Constitutional: negative for anorexia, fevers and sweats  Eyes: negative for irritation, redness and visual disturbance  Ears, nose, mouth, throat, and face: negative for earaches, epistaxis, nasal congestion and sore throat  Cardiovascular: negative for lower extremity edema, orthopnea, palpitations and syncope  Gastrointestinal: negative for abdominal pain, constipation, diarrhea, melena, nausea and vomiting  Genitourinary:negative for dysuria, frequency and hematuria  Hematologic/lymphatic: negative for bleeding, easy bruising and lymphadenopathy   Musculoskeletal:negative for arthralgias, muscle weakness and stiff joints  Neurological: negative for coordination problems, gait problems, headaches and weakness  Endocrine: negative for diabetic symptoms including polydipsia, polyuria and weight loss   Past Medical History:  He,  has a past medical history of Abnormal myocardial perfusion study (01/01/2011), Barrett's esophagus, Cataracts, bilateral, Chronic kidney disease, Claudication ([place]) (11/16/2011), COPD (chronic obstructive pulmonary disease) ([place]), Depression, Diabetes ([place]), Dysrhythmia, GERD (gastroesophageal reflux disease), Glaucoma, Hernia of abdominal wall, History of radiation therapy, HTN (hypertension), Hyperlipidemia, Morbid obesity ([place]), Myocardial infarction ([place]) (2008,2009,2009), OSA (obstructive sleep apnea), PAF (paroxysmal atrial fibrillation) ([place]), S/P colonoscopy (2009), S/P endoscopy (10/2010), Sleep apnea, SOB (shortness of breath) (11/03/2007), and Stroke ([place]) (2017).   Surgical History:   Past Surgical History:  Procedure Laterality Date   BIOPSY N/A 04/29/2015   Procedure: BIOPSY;  Surgeon: Danie Binder, MD;  Location: [facility];  Service: Endoscopy;  Laterality: N/A;   BIOPSY  08/01/2018   Procedure: BIOPSY;  Surgeon: Danie Binder, MD;  [facility];  Service: Endoscopy;;  esophageal   BIOPSY  10/12/2021   Procedure: BIOPSY;  Surgeon: Eloise Harman, DO;  [facility];  Service: Endoscopy;;   CABG X 4  03/2008   CARDIAC CATHETERIZATION  2009  stent placement to the left circumflex a 2.25    CARDIAC CATHETERIZATION  07/08/2010   CARDIAC CATHETERIZATION N/A 11/02/2019   COLONOSCOPY WITH PROPOFOL N/A 09/27/2017   normal ileum, twenty 4 to 8 mm polyps in the sigmoid colon, descending colon, splenic flexure, transverse colon, ascending colon, cecum.  An additional three 2 to 4 mm polyps in the rectum and the descending colon.  Diverticulosis, internal hemorrhoids.  Surgical  pathology found the polyps to be one fragment of hyperplastic polyp and 22 fragments of tubular adenoma..  Recommended repeat colonoscop   COLONOSCOPY WITH PROPOFOL N/A 10/12/2021   Procedure: COLONOSCOPY WITH PROPOFOL;  Surgeon: Eloise Harman, DO;  [facility];  Service: Endoscopy;  Laterality: N/A;  1:30pm   CORONARY ARTERY BYPASS GRAFT  2008   4 vessels   CORONARY STENT PLACEMENT  12/29/12   CORONARY STENT PLACEMENT  12/2012   ESOPHAGEAL DILATION N/A 04/29/2015   Procedure: ESOPHAGEAL DILATION 15 mm, 16 mm;  Surgeon: Danie Binder, MD;  Location: [facility];  Service: Endoscopy;  Laterality: N/A;   ESOPHAGOGASTRODUODENOSCOPY  09/2011   Barrett's esophagus, no dysplasia on biopsies. Distal esophagitis. Status post dilation. Moderate gastritis and duodenitis, but biopsies benign. Next EGD in November 2015 for surveillance of Barrett's esophagus.   ESOPHAGOGASTRODUODENOSCOPY (EGD) WITH PROPOFOL N/A 04/29/2015   SLF: 1. Barretts esophagus 2. Moderate non-erosive gastritis.    ESOPHAGOGASTRODUODENOSCOPY (EGD) WITH PROPOFOL N/A 08/01/2018   Barrett's, repeat in 5 years. Empiric dilatation, mild gastritis   HERNIA REPAIR     ventral hernia repair   LEFT HEART CATHETERIZATION WITH CORONARY/GRAFT ANGIOGRAM N/A 12/29/2012   Procedure: LEFT HEART CATHETERIZATION WITH Beatrix Fetters;  Surgeon: Troy Sine, MD;  Location: [facility] CATH LAB;  Service: Cardiovascular;  Laterality: N/A;   ORIF FIBULA FRACTURE Right 06/03/2015   Procedure: OPEN REDUCTION INTERNAL FIXATION (ORIF) DISTAL FIBULA  FRACTURE;  Surgeon: Garald Balding, MD;  Location: [facility];  Service: Orthopedics;  Laterality: Right;   PERCUTANEOUS CORONARY STENT INTERVENTION (PCI-S)  12/29/2012   Procedure: PERCUTANEOUS CORONARY STENT INTERVENTION (PCI-S);  Surgeon: Troy Sine, MD;  Location: [facility] CATH LAB;  Service: Cardiovascular;;   POLYPECTOMY  09/27/2017   Procedure: POLYPECTOMY;  Surgeon: Danie Binder, MD;  [facility];  Service: Endoscopy;;  cecal polyp, ascending polyps x6, transverse colon polyps x6, splenic flexure polyps x2, descending colon polyps x6, sigmoid  colon polyp x1, rectal polyp x1    POLYPECTOMY  10/12/2021   Procedure: POLYPECTOMY;  Surgeon: Eloise Harman, DO;  [facility];  Service: Endoscopy;;   SAVORY DILATION  09/06/2011   Procedure: SAVORY DILATION;  Surgeon: Dorothyann Peng, MD;  [facility]: [facility];  Service: Endoscopy;  Laterality: N/A;  Dilated with 46m   SAVORY DILATION N/A 08/01/2018   Procedure: SAVORY DILATION;  Surgeon: FDanie Binder MD;  [facility];  Service: Endoscopy;  Laterality: N/A;     Social History:   reports that he has been smoking cigarettes. He has a 15.00 pack-year smoking history. He has never used smokeless tobacco. He reports current alcohol use. He reports that he does not use drugs.   Family History:  His family history includes Diabetes in his father and mother; Heart disease in his father and mother; Pancreatic cancer in his brother. There is no history of Colon cancer, Anesthesia problems, Hypotension, Malignant hyperthermia, or Pseudochol deficiency.   Allergies Allergies  Allergen Reactions   Contrast Media [Iodinated Contrast Media]  Other (See Comments)    Pt must be premedicated before given contrast media - stops heart   Iohexol Other (See Comments)     Consult with radiologist before pre meds are given.Desc: PT. STATES HEART STOPPED HAS TO BE PREMED.      Home Medications  Prior to Admission medications   Medication Sig Start Date End Date Taking? Authorizing Provider  albuterol (VENTOLIN HFA) 108 (90 Base) MCG/ACT inhaler INHALE TWO PUFFS BY MOUTH INTO LUNGS EVERY 6 HOURS AS NEEDED FOR SHORTNESS OF BREATH OR FOR WHEEZING 06/29/22  Yes Rigoberto Noel, MD  apixaban (ELIQUIS) 5 MG TABS tablet Take 1 tablet (5 mg total) by mouth 2 (two) times daily. 06/04/15  Yes Petrarca, Mike Craze, PA-C  Cyanocobalamin  (VITAMIN B-12 IJ) Inject 1,000 mcg as directed every 30 (thirty) days.   Yes [provider]  diazepam (VALIUM) 5 MG tablet Take 5 mg by mouth 2 (two) times daily.   Yes [provider]  escitalopram (LEXAPRO) 20 MG tablet Take 20 mg by mouth in the morning. 04/27/14  Yes [provider]  fenofibrate (TRICOR) 145 MG tablet Take 145 mg by mouth in the morning.   Yes [provider]  Fluticasone-Umeclidin-Vilant (TRELEGY ELLIPTA) 100-62.5-25 MCG/ACT AEPB Inhale 1 puff into the lungs daily. 08/31/22  Yes Rigoberto Noel, MD  furosemide (LASIX) 80 MG tablet Take 80 mg by mouth in the morning. 04/22/15  Yes [provider]  gabapentin (NEURONTIN) 300 MG capsule Take 300 mg by mouth 3 (three) times daily. 08/11/22  Yes [provider]  insulin regular human CONCENTRATED (HUMULIN R U-500 KWIKPEN) 500 UNIT/ML KwikPen Inject 80 Units into the skin 3 (three) times daily with meals. Only when blood glucose is above 90 and eating. 08/09/22  Yes Cassandria Anger, MD  isosorbide mononitrate (IMDUR) 120 MG 24 hr tablet Take 120 mg by mouth in the morning.   Yes [provider]  lisinopril (ZESTRIL) 10 MG tablet Take 1 tablet (10 mg total) by mouth daily. 03/04/20  Yes Cassandria Anger, MD  meclizine (ANTIVERT) 25 MG tablet Take 25 mg by mouth every 6 (six) hours as needed for dizziness or nausea.   Yes [provider]  metoprolol tartrate (LOPRESSOR) 100 MG tablet Take 1 tablet (100 mg total) by mouth 2 (two) times daily. OFFICE VISIT NEEDED BEFORE ADDITIONAL REFILLS 07/16/19  Yes Branch, Alphonse Guild, MD  modafinil (PROVIGIL) 200 MG tablet Take 200 mg by mouth 2 (two) times daily. 09/11/19  Yes [provider]  nitroGLYCERIN (NITROLINGUAL) 0.4 MG/SPRAY spray Place 1 spray under the tongue every 5 (five) minutes x 3 doses as needed for chest pain. 01/30/19  Yes Long, Wonda Olds, MD  pantoprazole (PROTONIX) 40 MG tablet TAKE ONE TABLET BY  MOUTH TWICE DAILY BEFORE A meal 03/04/22  Yes Annitta Needs, NP  rosuvastatin (CRESTOR) 20 MG tablet Take 20 mg by mouth in the morning.   Yes [provider]  Vitamin D, Ergocalciferol, (DRISDOL) 1.25 MG (50000 UNIT) CAPS capsule Take 1 capsule (50,000 Units total) by mouth every 7 (seven) days. Patient taking differently: Take 50,000 Units by mouth every Friday. 10/01/20  Yes Cassandria Anger, MD  albuterol (PROVENTIL) (2.5 MG/3ML) 0.083% nebulizer solution Take 3 mLs (2.5 mg total) by nebulization every 6 (six) hours as needed for wheezing or shortness of breath. 10/08/21 11/07/21  Rigoberto Noel, MD  B-D ULTRAFINE III SHORT PEN 31G X 8 MM MISC USE AS DIRECTED  TWICE DAILY 05/24/22   Cassandria Anger, MD  blood glucose meter kit and supplies Dispense based on patient and insurance preference. Use up to two times daily as directed. (FOR ICD-E11.65) 08/06/19   Cassandria Anger, MD  Blood Glucose Monitoring Suppl (ONETOUCH VERIO) w/Device KIT 1 each by Does not apply route as needed. 06/11/21   Cassandria Anger, MD  Continuous Blood Gluc Receiver (FREESTYLE LIBRE 2 READER) DEVI As directed 06/26/20   Cassandria Anger, MD  Continuous Blood Gluc Sensor (FREESTYLE LIBRE 2 SENSOR) MISC 1 Piece by Does not apply route every 14 (fourteen) days. 08/09/22   Cassandria Anger, MD  Lancets (ONETOUCH DELICA PLUS [id number]) MISC USE TO check blood sugar FOUR TIMES DAILY AS DIRECTED 06/11/20   Nida, Marella Chimes, MD  ONETOUCH VERIO test strip USE TO check blood sugar FOUR TIMES DAILY AS DIRECTED 06/11/20   Nida, Marella Chimes, MD  polyethylene glycol-electrolytes (TRILYTE) 420 g solution Take 4,000 mLs by mouth as directed. Patient not taking: Reported on 10/12/2022 09/23/21   Eloise Harman, DO  potassium chloride SA (K-DUR,KLOR-CON) 20 MEQ tablet Take 1 tablet (20 mEq total) by mouth 2 (two) times daily. Patient not taking: Reported on 10/12/2022 07/22/14   Croitoru, Dani Gobble, MD   predniSONE (DELTASONE) 10 MG tablet Take 4 tabs for 2 days, then 3 tabs for 2 days, 2 tabs for 2 days, then 1 tab for 2 days, then stop. Patient not taking: Reported on 10/12/2022 06/11/22   Clayton Bibles, NP     Kara Mead MD. FCCP. [facility] Pager : [phone]  If no response to pager , please call [phone] until 7 pm After 7:00 pm call Elink  [phone]   10/12/2022

## 2022-10-12 NOTE — Assessment & Plan Note (Signed)
-   Chronic likely stage Ia  - Monitoring closely, -avoiding nephrotoxins -Stable BUN/creatinine Lab Results  Component Value Date   CREATININE 1.17 10/12/2022   CREATININE 1.22 04/26/2022   CREATININE 0.97 01/05/2022

## 2022-10-12 NOTE — Assessment & Plan Note (Signed)
Continue PPI ?

## 2022-10-12 NOTE — Assessment & Plan Note (Signed)
-   Monitoring daily weight, I's and O's  -Patient seems to be on p.o. Lasix of 80 mg daily -

## 2022-10-12 NOTE — H&P (Signed)
History and Physical   Patient: [name]                            PCP: Sharilyn Sites, MD                    DOB: 08/16/1961            DOA: 10/12/2022 OVF:[id number]             DOS: 10/12/2022, 2:51 PM  Sharilyn Sites, MD  Patient coming from:   HOME  I have personally reviewed patient's medical records, in electronic medical records, including:  [place] [facility], and care everywhere.    Chief Complaint:   Chief Complaint  Patient presents with   Chest Pain    History of present illness:    [name] is a 61 year old male with multiple comorbidities including COPD, O2 dependent, former smoker, morbid obesity, DM 2, hypertension, anxiety, depression, P A-fib, CHF... Presented with progressive shortness of breath and generalized weaknesses. Shortness of breath is progressively getting worse over the past few days, along with generalized weaknesses, he has difficulty with his activities daily living and function now. Also reporting of substernal intermittent chest pain which has been exacerbated by cough, shortness of breath and past few days.  Chest pains were sternal and intermittent with no radiation or diaphoresis. Denies of having the chest pain at this time   ED course:  Blood pressure 106/71, pulse 100, temperature 98.5 F (36.9 C), temperature source Oral, resp. rate 17, height _0  (1.676 m), weight (!) 140.6 kg, SpO2 95 %.  On 5 L of oxygen  CMP: Within normal limits exception of potassium 3.1, glucose 143, calcium 8.4 CBC: Normal limits with WBC of 3.7  SARS-CoV-2 positive  Patient was started on breathing treatment, IV steroids... Discussed with pharmacy regarding initiation of Paxlovid  Requested to be admitted for acute on chronic respiratory failure due to [place]   Patient Denies having: Fever, Chills, Cough,Abd pain, N/V/D, headache, dizziness, lightheadedness,  Dysuria, Joint pain, rash, open wounds    Review of Systems: As per HPI,  otherwise 10 point review of systems were negative.   ----------------------------------------------------------------------------------------------------------------------  Allergies  Allergen Reactions   Contrast Media [Iodinated Contrast Media] Other (See Comments)    Pt must be premedicated before given contrast media - stops heart   Iohexol Other (See Comments)     Consult with radiologist before pre meds are given.Desc: PT. STATES HEART STOPPED HAS TO BE PREMED.     Home MEDs:  Prior to Admission medications   Medication Sig Start Date End Date Taking? Authorizing Provider  albuterol (VENTOLIN HFA) 108 (90 Base) MCG/ACT inhaler INHALE TWO PUFFS BY MOUTH INTO LUNGS EVERY 6 HOURS AS NEEDED FOR SHORTNESS OF BREATH OR FOR WHEEZING 06/29/22  Yes Rigoberto Noel, MD  Cyanocobalamin (VITAMIN B-12 IJ) Inject 1,000 mcg as directed every 30 (thirty) days.   Yes [provider]  diazepam (VALIUM) 5 MG tablet Take 5 mg by mouth 2 (two) times daily.   Yes [provider]  escitalopram (LEXAPRO) 20 MG tablet Take 20 mg by mouth in the morning. 04/27/14  Yes [provider]  fenofibrate (TRICOR) 145 MG tablet Take 145 mg by mouth in the morning.   Yes [provider]  Fluticasone-Umeclidin-Vilant (TRELEGY ELLIPTA) 100-62.5-25 MCG/ACT AEPB Inhale 1 puff into the lungs daily. 08/31/22  Yes Rigoberto Noel, MD  furosemide (LASIX) 80 MG tablet Take 80 mg by mouth in the morning. 04/22/15  Yes [provider]  gabapentin (NEURONTIN) 300 MG capsule Take 300 mg by mouth 3 (three) times daily. 08/11/22  Yes [provider]  insulin regular human CONCENTRATED (HUMULIN R U-500 KWIKPEN) 500 UNIT/ML KwikPen Inject 80 Units into the skin 3 (three) times daily with meals. Only when blood glucose is above 90 and eating. 08/09/22  Yes Cassandria Anger, MD  isosorbide mononitrate (IMDUR) 120 MG 24 hr tablet Take 120 mg by mouth in the morning.   Yes [provider]  lisinopril (ZESTRIL) 10 MG tablet Take 1 tablet (10 mg total) by mouth daily. 03/04/20  Yes Cassandria Anger, MD  meclizine (ANTIVERT) 25 MG tablet Take 25 mg by mouth every 6 (six) hours as needed for dizziness or nausea.   Yes [provider]  metoprolol tartrate (LOPRESSOR) 100 MG tablet Take 1 tablet (100 mg total) by mouth 2 (two) times daily. OFFICE VISIT NEEDED BEFORE ADDITIONAL REFILLS 07/16/19  Yes Branch, Alphonse Guild, MD  modafinil (PROVIGIL) 200 MG tablet Take 200 mg by mouth 2 (two) times daily. 09/11/19  Yes [provider]  nitroGLYCERIN (NITROLINGUAL) 0.4 MG/SPRAY spray Place 1 spray under the tongue every 5 (five) minutes x 3 doses as needed for chest pain. 01/30/19  Yes Long, Wonda Olds, MD  pantoprazole (PROTONIX) 40 MG tablet TAKE ONE TABLET BY MOUTH TWICE DAILY BEFORE A meal 03/04/22  Yes Annitta Needs, NP  rosuvastatin (CRESTOR) 20 MG tablet Take 20 mg by mouth in the morning.   Yes [provider]  Vitamin D, Ergocalciferol, (DRISDOL) 1.25 MG (50000 UNIT) CAPS capsule Take 1 capsule (50,000 Units total) by mouth every 7 (seven) days. Patient taking differently: Take 50,000 Units by mouth every Friday. 10/01/20  Yes Cassandria Anger, MD  albuterol (PROVENTIL) (2.5 MG/3ML) 0.083% nebulizer solution Take 3 mLs (2.5 mg total) by nebulization every 6 (six) hours as needed for wheezing or shortness of breath. 10/08/21 11/07/21  Rigoberto Noel, MD  apixaban (ELIQUIS) 5 MG TABS tablet Take 1 tablet (5 mg total) by mouth 2 (two) times daily. Patient not taking: Reported on 10/12/2022 06/04/15   Biagio Borg D, PA-C  B-D ULTRAFINE III SHORT PEN 31G X 8 MM MISC USE AS DIRECTED TWICE DAILY 05/24/22   Cassandria Anger, MD  blood glucose meter kit and supplies Dispense based on patient and insurance preference. Use up to two times daily as directed. (FOR ICD-E11.65) 08/06/19   Cassandria Anger, MD  Blood Glucose Monitoring Suppl (ONETOUCH VERIO)  w/Device KIT 1 each by Does not apply route as needed. 06/11/21   Cassandria Anger, MD  Continuous Blood Gluc Receiver (FREESTYLE LIBRE 2 READER) DEVI As directed 06/26/20   Cassandria Anger, MD  Continuous Blood Gluc Sensor (FREESTYLE LIBRE 2 SENSOR) MISC 1 Piece by Does not apply route every 14 (fourteen) days. 08/09/22   Cassandria Anger, MD  Lancets (ONETOUCH DELICA PLUS [id number]) MISC USE TO check blood sugar FOUR TIMES DAILY AS DIRECTED 06/11/20   Nida, Marella Chimes, MD  ONETOUCH VERIO test strip USE TO check blood sugar FOUR TIMES DAILY AS DIRECTED 06/11/20   Nida, Marella Chimes, MD  polyethylene glycol-electrolytes (TRILYTE) 420 g solution Take 4,000 mLs by mouth as directed. Patient not taking: Reported on 10/12/2022 09/23/21   Eloise Harman, DO  potassium chloride SA (K-DUR,KLOR-CON) 20 MEQ tablet Take 1 tablet (20 mEq  total) by mouth 2 (two) times daily. Patient not taking: Reported on 10/12/2022 07/22/14   Croitoru, Dani Gobble, MD  predniSONE (DELTASONE) 10 MG tablet Take 4 tabs for 2 days, then 3 tabs for 2 days, 2 tabs for 2 days, then 1 tab for 2 days, then stop. Patient not taking: Reported on 10/12/2022 06/11/22   Clayton Bibles, NP    PRN MEDs: acetaminophen, nitroGLYCERIN, ondansetron **OR** ondansetron (ZOFRAN) IV, oxyCODONE, traZODone  Past Medical History:  Diagnosis Date   Abnormal myocardial perfusion study 01/01/2011   there a small to moderate sized inferobasal scar   Barrett's esophagus    Cataracts, bilateral    Chronic kidney disease    hx of kidney stones   Claudication ([place]) 11/16/2011   PV test perform shows normal   COPD (chronic obstructive pulmonary disease) ([place])    Depression    Diabetes ([place])    type 2 diabetes mellitus   Dysrhythmia    GERD (gastroesophageal reflux disease)    Glaucoma    Hernia of abdominal wall    History of radiation therapy    Right lung, SBRT- 04/16/21-04/30/21- Dr. Gery Pray   HTN (hypertension)     Hyperlipidemia    Morbid obesity ([place])    Myocardial infarction ([place]) 2008,2009,2009   OSA (obstructive sleep apnea)    on cpap   PAF (paroxysmal atrial fibrillation) ([place])    S/P colonoscopy 2009   3-4 mm transverse colon erosions likely secondary to  ASA   S/P endoscopy 10/2010   moderate erosive gastritis, Barrett's esophagus 1-2cm   Sleep apnea    SOB (shortness of breath) 11/03/2007   2D Echo EF 50%-55%   Stroke ([place]) 2017    Past Surgical History:  Procedure Laterality Date   BIOPSY N/A 04/29/2015   Procedure: BIOPSY;  Surgeon: Danie Binder, MD;  Location: [facility];  Service: Endoscopy;  Laterality: N/A;   BIOPSY  08/01/2018   Procedure: BIOPSY;  Surgeon: Danie Binder, MD;  [facility];  Service: Endoscopy;;  esophageal   BIOPSY  10/12/2021   Procedure: BIOPSY;  Surgeon: Eloise Harman, DO;  [facility];  Service: Endoscopy;;   CABG X 4  03/2008   CARDIAC CATHETERIZATION  2009   stent placement to the left circumflex a 2.25    CARDIAC CATHETERIZATION  07/08/2010   CARDIAC CATHETERIZATION N/A 11/02/2019   COLONOSCOPY WITH PROPOFOL N/A 09/27/2017   normal ileum, twenty 4 to 8 mm polyps in the sigmoid colon, descending colon, splenic flexure, transverse colon, ascending colon, cecum.  An additional three 2 to 4 mm polyps in the rectum and the descending colon.  Diverticulosis, internal hemorrhoids.  Surgical pathology found the polyps to be one fragment of hyperplastic polyp and 22 fragments of tubular adenoma..  Recommended repeat colonoscop   COLONOSCOPY WITH PROPOFOL N/A 10/12/2021   Procedure: COLONOSCOPY WITH PROPOFOL;  Surgeon: Eloise Harman, DO;  [facility];  Service: Endoscopy;  Laterality: N/A;  1:30pm   CORONARY ARTERY BYPASS GRAFT  2008   4 vessels   CORONARY STENT PLACEMENT  12/29/12   CORONARY STENT PLACEMENT  12/2012   ESOPHAGEAL DILATION N/A 04/29/2015   Procedure: ESOPHAGEAL DILATION 15 mm, 16 mm;  Surgeon: Danie Binder, MD;  Location: [facility];  Service: Endoscopy;  Laterality: N/A;   ESOPHAGOGASTRODUODENOSCOPY  09/2011   Barrett's esophagus, no dysplasia on biopsies. Distal esophagitis. Status post dilation. Moderate gastritis and duodenitis, but biopsies benign. Next EGD  in November 2015 for surveillance of Barrett's esophagus.   ESOPHAGOGASTRODUODENOSCOPY (EGD) WITH PROPOFOL N/A 04/29/2015   SLF: 1. Barretts esophagus 2. Moderate non-erosive gastritis.    ESOPHAGOGASTRODUODENOSCOPY (EGD) WITH PROPOFOL N/A 08/01/2018   Barrett's, repeat in 5 years. Empiric dilatation, mild gastritis   HERNIA REPAIR     ventral hernia repair   LEFT HEART CATHETERIZATION WITH CORONARY/GRAFT ANGIOGRAM N/A 12/29/2012   Procedure: LEFT HEART CATHETERIZATION WITH Beatrix Fetters;  Surgeon: Troy Sine, MD;  Location[facility] CATH LAB;  Service: Cardiovascular;  Laterality: N/A;   ORIF FIBULA FRACTURE Right 06/03/2015   Procedure: OPEN REDUCTION INTERNAL FIXATION (ORIF) DISTAL FIBULA  FRACTURE;  Surgeon: Garald Balding, MD;  Location: [place];  Service: Orthopedics;  Laterality: Right;   PERCUTANEOUS CORONARY STENT INTERVENTION (PCI-S)  12/29/2012   Procedure: PERCUTANEOUS CORONARY STENT INTERVENTION (PCI-S);  Surgeon: Troy Sine, MD;  Location: [facility] CATH LAB;  Service: Cardiovascular;;   POLYPECTOMY  09/27/2017   Procedure: POLYPECTOMY;  Surgeon: Danie Binder, MD;  [facility];  Service: Endoscopy;;  cecal polyp, ascending polyps x6, transverse colon polyps x6, splenic flexure polyps x2, descending colon polyps x6, sigmoid  colon polyp x1, rectal polyp x1    POLYPECTOMY  10/12/2021   Procedure: POLYPECTOMY;  Surgeon: Eloise Harman, DO;  [facility];  Service: Endoscopy;;   SAVORY DILATION  09/06/2011   Procedure: SAVORY DILATION;  Surgeon: Dorothyann Peng, MD;  Location: [facility];  Service: Endoscopy;  Laterality: N/A;  Dilated with 67m   SAVORY DILATION N/A 08/01/2018   Procedure: SAVORY  DILATION;  Surgeon: FDanie Binder MD;  [facility];  Service: Endoscopy;  Laterality: N/A;     reports that he has been smoking cigarettes. He has a 15.00 pack-year smoking history. He has never used smokeless tobacco. He reports current alcohol use. He reports that he does not use drugs.   Family History  Problem Relation Age of Onset   Diabetes Mother    Heart disease Mother    Diabetes Father    Heart disease Father    Pancreatic cancer Brother        age 59 doing well   Colon cancer Neg Hx    Anesthesia problems Neg Hx    Hypotension Neg Hx    Malignant hyperthermia Neg Hx    Pseudochol deficiency Neg Hx     Physical Exam:   Vitals:   10/12/22 1230 10/12/22 1300 10/12/22 1330 10/12/22 1430  BP: 106/71 112/72 (!) 105/57 111/70  Pulse: 100 (!) 108 (!) 108 (!) 105  Resp: 17 (!) 28 16 (!) 35  Temp:   98.2 F (36.8 C) 98 F (36.7 C)  TempSrc:   Oral Oral  SpO2: 95% 93% 90% 93%  Weight:      Height:       Constitutional: NAD, calm, comfortable Eyes: PERRL, lids and conjunctivae normal ENMT: Mucous membranes are moist. Posterior pharynx clear of any exudate or lesions.Normal dentition.  Neck: normal, supple, no masses, no thyromegaly Respiratory: Diffuse rhonchi, and wheezing, mild labored breathing, positive air sounds in all 4 lobes Cardiovascular: Regular rate and rhythm, no murmurs / rubs / gallops. No extremity edema. 2+ pedal pulses. No carotid bruits.  Abdomen: no tenderness, no masses palpated. No hepatosplenomegaly. Bowel sounds positive.  Musculoskeletal: no clubbing / cyanosis. No joint deformity upper and lower extremities. Good ROM, no contractures. Normal muscle tone.  Neurologic: CN II-XII grossly intact. Sensation intact, DTR normal.  Strength 5/5 in all 4.  Psychiatric: Normal judgment and insight. Alert and oriented x 3. Normal mood.  Skin: no rashes, lesions, ulcers. No induration Decubitus/ulcers:  Wounds: per nursing documentation          Labs on admission:    I have personally reviewed following labs and imaging studies  CBC: Recent Labs  Lab 10/12/22 1021  WBC 3.7*  HGB 15.6  HCT 46.0  MCV 93.9  PLT 938   Basic Metabolic Panel: Recent Labs  Lab 10/12/22 1021  NA 141  K 3.1*  CL 104  CO2 23  GLUCOSE 143*  BUN 11  CREATININE 1.17  CALCIUM 8.4*    Urine analysis:    Component Value Date/Time   COLORURINE YELLOW 06/02/2015 1525   APPEARANCEUR CLEAR 06/02/2015 1525   LABSPEC 1.010 06/02/2015 1525   PHURINE 7.5 06/02/2015 1525   GLUCOSEU 100 (A) 06/02/2015 1525   GLUCOSEU NEG mg/dL 06/01/2010 2238   HGBUR NEGATIVE 06/02/2015 [place] 06/02/2015 1525   KETONESUR NEGATIVE 06/02/2015 1525   PROTEINUR NEGATIVE 06/02/2015 1525   UROBILINOGEN 1.0 06/02/2015 1525   NITRITE NEGATIVE 06/02/2015 1525   LEUKOCYTESUR NEGATIVE 06/02/2015 1525    Last A1C:  Lab Results  Component Value Date   HGBA1C 8.9 (A) 08/09/2022     Radiologic Exams on Admission:   DG Chest Portable 1 View  Result Date: 10/12/2022 CLINICAL DATA:  Chest pain EXAM: PORTABLE CHEST 1 VIEW COMPARISON:  Previous studies including the examination of 05/14/2022 and CT done on 08/13/2022 FINDINGS: Transverse diameter of heart is slightly increased. There is poor inspiration. There is prominence of interstitial markings in parahilar regions and lower lung fields. There is slight improvement in aeration in right parahilar region. There is no new focal pulmonary consolidation. There is blunting of both lateral CP angles. There is no pneumothorax. IMPRESSION: There is residual prominence of interstitial markings in parahilar regions and lower lung fields suggesting interstitial edema or interstitial pneumonia. Part of this finding may suggest underlying scarring. There is interval appearance of small bilateral pleural effusions, more so on the right side. Electronically Signed   By: Elmer Picker M.D.   On:  10/12/2022 10:41    EKG:   Independently reviewed.  Orders placed or performed during the hospital encounter of 10/12/22   ED EKG   ED EKG   ---------------------------------------------------------------------------------------------------------------------------------------    Assessment / Plan:   Principal Problem:   Acute on chronic respiratory failure with hypoxia (HCC) Active Problems:   Chest pain   Hypokalemia   DM hyperosmolarity type II, uncontrolled (HCC)   Mixed hyperlipidemia   Essential hypertension, benign   CAD (coronary artery disease)   PAF (paroxysmal atrial fibrillation) (HCC)   GERD (gastroesophageal reflux disease)   Barrett's esophagus   Depression   Obesity, unspecified   COPD with acute exacerbation (HCC)   Chronic kidney disease   H/O Stroke   SOB (shortness of breath)   Diastolic congestive heart failure, NYHA class 2 ([place])   COVID-19 virus infection   Assessment and Plan: * Acute on chronic respiratory failure with hypoxia (HCC) -SARS-CoV-2 positive -Underlying history of COPD -Morbid obesity with likely obstructive sleep apnea, CHF  Chronically O2 dependent  Acute on chronic respiratory failure multifactorial as mentioned above -Continue O2 by nasal cannula, currently requiring 5 L, -Maintaining O2 sat 88-92% --will titrate supplemental oxygen down -Initiating IV steroids -Mucolytic's-including Tussionex  Treating COVID: Will evaluate if patient is a candidate for Paxlovid -Isolation,  Continue supportive therapy  Chest pain -Atypical chest pain -Intermittent chest pain, denies any chest pain at this time -Atypical, exacerbated by respiratory distress, cough, shortness of breath -Recycle troponin -No changes in EKG  -As needed nitroglycerin, aspirin, continue home medication -  DM hyperosmolarity type II, uncontrolled (HCC) - Uncontrolled DM type II last A1c was 8.9, on 08/09/2022 -Rechecking A1c -Checking CBG QA CHS with  SSI coverage -On concentrated insulin-will try to mimic and initiate Otherwise will substitute with long-acting insulin   Hypokalemia - Monitoring repleting orally  COVID-19 virus infection - Continue supportive therapy, supplemental oxygen, -Consulting pharmacy, assessing patient if he would be a candidate for oral Paxlovid The benefit and initiation of Paxlovid outweighs the risks -Resuming inhaler Trelegy/Ellipta, will substitute  Diastolic congestive heart failure, NYHA class 2 ([place]) - Monitoring daily weight, I's and O's  -Patient seems to be on p.o. Lasix of 80 mg daily -  SOB (shortness of breath) Due to COPD exacerbation, exacerbated in SARS-CoV-2 infection -Treatment as above  H/O Stroke - Continue statins, patient seems to be on Eliquis but is on hold We will investigate further  Chronic kidney disease - Chronic likely stage Ia  - Monitoring closely, -avoiding nephrotoxins -Stable BUN/creatinine Lab Results  Component Value Date   CREATININE 1.17 10/12/2022   CREATININE 1.22 04/26/2022   CREATININE 0.97 01/05/2022     COPD with acute exacerbation (HCC) - Chronically O2 dependent -Currently requiring 5 L of oxygen, to maintain O2 sat of 95%, -Continue IV steroids, bronchodilators,  Obesity, unspecified - Monitoring, advised on weight loss healthy diet and exercise Body mass index is 50.04 kg/m.   Depression -Stable  - will resume home medications: Including Valium, Lexapro,  Barrett's esophagus -Stable continue PPI  GERD (gastroesophageal reflux disease) Continue PPI  PAF (paroxysmal atrial fibrillation) (HCC) - Currently normal sinus on metoprolol -Supposed to be on Eliquis  CAD (coronary artery disease) - Stable resuming home medication: Including isosorbide, metoprolol, Will hold her lisinopril  Essential hypertension, benign - Currently stable, resuming home medication with exception of lisinopril  Mixed hyperlipidemia - We will  holding statins to reduce risk of rhabdomyolysis somewhat Paxlovid  Cerebral infarction (HCC)-resolved as of 10/12/2022 -               Consults called:   Pulmonary Dr. Elsworth Soho -------------------------------------------------------------------------------------------------------------------------------------------- DVT prophylaxis:  SCDs Start: 10/12/22 1250   Code Status:   Code Status: Full Code   Admission status: Patient will be admitted as Inpatient, with a greater than 2 midnight length of stay. Level of care: Telemetry   Family Communication:  none at bedside  (The above findings and plan of care has been discussed with patient in detail, the patient expressed understanding and agreement of above plan)  --------------------------------------------------------------------------------------------------------------------------------------------------  Disposition Plan:  Anticipated 1-2 days Status is: Inpatient Remains inpatient appropriate because: Chronic respiratory failure, needing supplemental oxygen, breathing treatment, close evaluation, treatment for COVID     ----------------------------------------------------------------------------------------------------------------------------------------------------  Time spent: > than  2  Min.   SIGNED: Deatra James, MD, FHM. [facility],  Pager (Please use [email or web address] to page to text)  If 7PM-7AM, please contact night-coverage [email or web address],  10/12/2022, 2:51 PM

## 2022-10-12 NOTE — Assessment & Plan Note (Signed)
-  Stable continue PPI

## 2022-10-12 NOTE — Assessment & Plan Note (Signed)
-  Stable  - will resume home medications: Including Valium, Lexapro,

## 2022-10-12 NOTE — Assessment & Plan Note (Signed)
-   Monitoring repleting orally

## 2022-10-12 NOTE — Hospital Course (Addendum)
[name] is a 61 year old male with multiple comorbidities including COPD, O2 dependent, former smoker, morbid obesity, DM 2, hypertension, anxiety, depression, P A-fib, CHF... Presented with progressive shortness of breath and generalized weaknesses. Shortness of breath is progressively getting worse over the past few days, along with generalized weaknesses, he has difficulty with his activities daily living and function now. Also reporting of substernal intermittent chest pain which has been exacerbated by cough, shortness of breath and past few days.  Chest pains were sternal and intermittent with no radiation or diaphoresis. Denies of having the chest pain at this time   ED course:  Blood pressure 106/71, pulse 100, temperature 98.5 F (36.9 C), temperature source Oral, resp. rate 17, height 5\' 6"  (1.676 m), weight (!) 140.6 kg, SpO2 95 %.  On 5 L of oxygen  CMP: Within normal limits exception of potassium 3.1, glucose 143, calcium 8.4 CBC: Normal limits with WBC of 3.7  SARS-CoV-2 positive  Patient was started on breathing treatment, IV steroids... Discussed with pharmacy regarding initiation of Paxlovid  Requested to be admitted for acute on chronic respiratory failure due to COVID

## 2022-10-12 NOTE — ED Triage Notes (Signed)
"  Chest pain x 2 days, radiates to left arm, worse today, nausea, shortness of breath. Took nitro at home and said it helped ease the pain some.  Was having some wheezing so gave albuterol/atrovent in route" per EMS

## 2022-10-12 NOTE — Assessment & Plan Note (Signed)
-   Continue statins, patient seems to be on Eliquis but is on hold We will investigate further

## 2022-10-12 NOTE — Assessment & Plan Note (Signed)
-   Chronically O2 dependent -Currently requiring 5 L of oxygen, to maintain O2 sat of 95%, -Continue IV steroids, bronchodilators,

## 2022-10-12 NOTE — Progress Notes (Signed)
   10/12/22 1541  Assess: MEWS Score  Temp 98.5 F (36.9 C)  BP 120/71  MAP (mmHg) 87  Pulse Rate (!) 108  Resp (!) 36  Level of Consciousness Alert  SpO2 95 %  O2 Device Nasal Cannula  O2 Flow Rate (L/min) 3 L/min  Assess: MEWS Score  MEWS Temp 0  MEWS Systolic 0  MEWS Pulse 1  MEWS RR 3  MEWS LOC 0  MEWS Score 4  MEWS Score Color Red  Assess: if the MEWS score is Yellow or Red  Were vital signs taken at a resting state? Yes  Focused Assessment No change from prior assessment  Does the patient meet 2 or more of the SIRS criteria? No  MEWS guidelines implemented *See Row Information* Yes  Treat  MEWS Interventions Consulted Respiratory Therapy  Take Vital Signs  Increase Vital Sign Frequency  Red: Q 1hr X 4 then Q 4hr X 4, if remains red, continue Q 4hrs  Notify: Charge Nurse/RN  Name of Charge Nurse/RN Notified angel ray  Date Charge Nurse/RN Notified 10/12/22  Time Charge Nurse/RN Notified 1550  Provider Notification  Provider Name/Title [email or web address]  Date Provider Notified 10/12/22  Time Provider Notified 1550  Method of Notification Page  Notification Reason Other (Comment) (red mews)  Assess: SIRS CRITERIA  SIRS Temperature  0  SIRS Pulse 1  SIRS Respirations  1  SIRS WBC 1  SIRS Score Sum  3   MD Shahmehdi and charge nurse angel notified. Respiratory called and in patient's room at the moment.

## 2022-10-12 NOTE — ED Provider Notes (Addendum)
[facility] EMERGENCY DEPARTMENT Provider Note   CSN: [id number] Arrival date & time: 10/12/22  0930     History  Chief Complaint  Patient presents with   Chest Pain    [name] is a 61 y.o. male.  HPI Patient presents with 2 family members who assist with history.  He presents due to dyspnea, fatigue.  He has multiple medical issues including CHF, diastolic dysfunction, COPD, is a former smoker, morbid obesity, diabetes and hypertension.  Patient also has ongoing evaluation for possible pulmonary nodule, but no further evaluation thus far. He notes that in particular over the past few days he has been worse than usual, though he notes at baseline he has difficulty performing ADL and functioning.     Home Medications Prior to Admission medications   Medication Sig Start Date End Date Taking? Authorizing Provider  albuterol (VENTOLIN HFA) 108 (90 Base) MCG/ACT inhaler INHALE TWO PUFFS BY MOUTH INTO LUNGS EVERY 6 HOURS AS NEEDED FOR SHORTNESS OF BREATH OR FOR WHEEZING 06/29/22  Yes Rigoberto Noel, MD  Cyanocobalamin (VITAMIN B-12 IJ) Inject 1,000 mcg as directed every 30 (thirty) days.   Yes [provider]  diazepam (VALIUM) 5 MG tablet Take 5 mg by mouth 2 (two) times daily.   Yes [provider]  escitalopram (LEXAPRO) 20 MG tablet Take 20 mg by mouth in the morning. 04/27/14  Yes [provider]  fenofibrate (TRICOR) 145 MG tablet Take 145 mg by mouth in the morning.   Yes [provider]  Fluticasone-Umeclidin-Vilant (TRELEGY ELLIPTA) 100-62.5-25 MCG/ACT AEPB Inhale 1 puff into the lungs daily. 08/31/22  Yes Rigoberto Noel, MD  furosemide (LASIX) 80 MG tablet Take 80 mg by mouth in the morning. 04/22/15  Yes [provider]  gabapentin (NEURONTIN) 300 MG capsule Take 300 mg by mouth 3 (three) times daily. 08/11/22  Yes [provider]  insulin regular human CONCENTRATED (HUMULIN R U-500 KWIKPEN) 500 UNIT/ML KwikPen  Inject 80 Units into the skin 3 (three) times daily with meals. Only when blood glucose is above 90 and eating. 08/09/22  Yes Cassandria Anger, MD  isosorbide mononitrate (IMDUR) 120 MG 24 hr tablet Take 120 mg by mouth in the morning.   Yes [provider]  lisinopril (ZESTRIL) 10 MG tablet Take 1 tablet (10 mg total) by mouth daily. 03/04/20  Yes Cassandria Anger, MD  meclizine (ANTIVERT) 25 MG tablet Take 25 mg by mouth every 6 (six) hours as needed for dizziness or nausea.   Yes [provider]  metoprolol tartrate (LOPRESSOR) 100 MG tablet Take 1 tablet (100 mg total) by mouth 2 (two) times daily. OFFICE VISIT NEEDED BEFORE ADDITIONAL REFILLS 07/16/19  Yes Branch, Alphonse Guild, MD  modafinil (PROVIGIL) 200 MG tablet Take 200 mg by mouth 2 (two) times daily. 09/11/19  Yes [provider]  nitroGLYCERIN (NITROLINGUAL) 0.4 MG/SPRAY spray Place 1 spray under the tongue every 5 (five) minutes x 3 doses as needed for chest pain. 01/30/19  Yes Long, Wonda Olds, MD  pantoprazole (PROTONIX) 40 MG tablet TAKE ONE TABLET BY MOUTH TWICE DAILY BEFORE A meal 03/04/22  Yes Annitta Needs, NP  rosuvastatin (CRESTOR) 20 MG tablet Take 20 mg by mouth in the morning.   Yes [provider]  Vitamin D, Ergocalciferol, (DRISDOL) 1.25 MG (50000 UNIT) CAPS capsule Take 1 capsule (50,000 Units total) by mouth every 7 (seven) days. Patient taking differently: Take 50,000 Units by mouth every Friday. 10/01/20  Yes Cassandria Anger, MD  albuterol (PROVENTIL) (2.5 MG/3ML) 0.083% nebulizer solution Take 3 mLs (2.5 mg total) by nebulization every 6 (six) hours as needed for wheezing or shortness of breath. 10/08/21 11/07/21  Rigoberto Noel, MD  apixaban (ELIQUIS) 5 MG TABS tablet Take 1 tablet (5 mg total) by mouth 2 (two) times daily. Patient not taking: Reported on 10/12/2022 06/04/15   Biagio Borg D, PA-C  B-D ULTRAFINE III SHORT PEN 31G X 8 MM MISC USE AS DIRECTED TWICE DAILY 05/24/22    Cassandria Anger, MD  blood glucose meter kit and supplies Dispense based on patient and insurance preference. Use up to two times daily as directed. (FOR ICD-E11.65) 08/06/19   Cassandria Anger, MD  Blood Glucose Monitoring Suppl (ONETOUCH VERIO) w/Device KIT 1 each by Does not apply route as needed. 06/11/21   Cassandria Anger, MD  Continuous Blood Gluc Receiver (FREESTYLE LIBRE 2 READER) DEVI As directed 06/26/20   Cassandria Anger, MD  Continuous Blood Gluc Sensor (FREESTYLE LIBRE 2 SENSOR) MISC 1 Piece by Does not apply route every 14 (fourteen) days. 08/09/22   Cassandria Anger, MD  Lancets (ONETOUCH DELICA PLUS [id number]) MISC USE TO check blood sugar FOUR TIMES DAILY AS DIRECTED 06/11/20   Nida, Marella Chimes, MD  ONETOUCH VERIO test strip USE TO check blood sugar FOUR TIMES DAILY AS DIRECTED 06/11/20   Nida, Marella Chimes, MD  polyethylene glycol-electrolytes (TRILYTE) 420 g solution Take 4,000 mLs by mouth as directed. Patient not taking: Reported on 10/12/2022 09/23/21   Eloise Harman, DO  potassium chloride SA (K-DUR,KLOR-CON) 20 MEQ tablet Take 1 tablet (20 mEq total) by mouth 2 (two) times daily. Patient not taking: Reported on 10/12/2022 07/22/14   Croitoru, Dani Gobble, MD  predniSONE (DELTASONE) 10 MG tablet Take 4 tabs for 2 days, then 3 tabs for 2 days, 2 tabs for 2 days, then 1 tab for 2 days, then stop. Patient not taking: Reported on 10/12/2022 06/11/22   Clayton Bibles, NP      Allergies    Contrast media [iodinated contrast media] and Iohexol    Review of Systems   Review of Systems  All other systems reviewed and are negative.   Physical Exam Updated Vital Signs BP 106/71   Pulse 100   Temp 98.5 F (36.9 C) (Oral)   Resp 17   Ht _0  (1.676 m)   Wt (!) 140.6 kg   SpO2 95%   BMI 50.04 kg/m  Physical Exam Vitals and nursing note reviewed.  Constitutional:      General: He is not in acute distress.    Appearance: He is  well-developed. He is obese.  HENT:     Head: Normocephalic and atraumatic.  Eyes:     Conjunctiva/sclera: Conjunctivae normal.  Cardiovascular:     Rate and Rhythm: Normal rate and regular rhythm.  Pulmonary:     Effort: Pulmonary effort is normal.     Breath sounds: Wheezing present.  Abdominal:     General: There is no distension.     Palpations: Abdomen is soft.     Tenderness: There is no abdominal tenderness.  Skin:    General: Skin is warm and dry.  Neurological:     Mental Status: He is alert and oriented to person, place, and time.     ED Results / Procedures / Treatments   Labs (all labs ordered are listed, but only abnormal results are displayed) Labs Reviewed  RESP PANEL BY RT-PCR (RSV, FLU A&B, COVID)  [id number] - Abnormal; Notable for the following components:      Result Value   SARS Coronavirus 2 by RT PCR POSITIVE (*)    All other components within normal limits  BASIC METABOLIC PANEL - Abnormal; Notable for the following components:   Potassium 3.1 (*)    Glucose, Bld 143 (*)    Calcium 8.4 (*)    All other components within normal limits  CBC - Abnormal; Notable for the following components:   WBC 3.7 (*)    All other components within normal limits  BRAIN NATRIURETIC PEPTIDE  D-DIMER, QUANTITATIVE  HIV ANTIBODY (ROUTINE TESTING W REFLEX)  HEMOGLOBIN A1C  CBG MONITORING, ED  TROPONIN I (HIGH SENSITIVITY)  TROPONIN I (HIGH SENSITIVITY)    EKG None  Radiology DG Chest Portable 1 View  Result Date: 10/12/2022 CLINICAL DATA:  Chest pain EXAM: PORTABLE CHEST 1 VIEW COMPARISON:  Previous studies including the examination of 05/14/2022 and CT done on 08/13/2022 FINDINGS: Transverse diameter of heart is slightly increased. There is poor inspiration. There is prominence of interstitial markings in parahilar regions and lower lung fields. There is slight improvement in aeration in right parahilar region. There is no new focal pulmonary consolidation. There  is blunting of both lateral CP angles. There is no pneumothorax. IMPRESSION: There is residual prominence of interstitial markings in parahilar regions and lower lung fields suggesting interstitial edema or interstitial pneumonia. Part of this finding may suggest underlying scarring. There is interval appearance of small bilateral pleural effusions, more so on the right side. Electronically Signed   By: Elmer Picker M.D.   On: 10/12/2022 10:41    Procedures Procedures    Medications Ordered in ED Medications  albuterol (PROVENTIL,VENTOLIN) solution continuous neb (10 mg/hr Nebulization New Bag/Given 10/12/22 1148)  nirmatrelvir/ritonavir EUA (PAXLOVID) 3 tablet (has no administration in time range)  methylPREDNISolone sodium succinate (SOLU-MEDROL) 125 mg/2 mL injection 140.625 mg (has no administration in time range)    Followed by  predniSONE (DELTASONE) tablet 50 mg (has no administration in time range)  guaiFENesin-dextromethorphan (ROBITUSSIN DM) 100-10 MG/5ML syrup 10 mL (has no administration in time range)  chlorpheniramine-HYDROcodone (TUSSIONEX) 10-8 MG/5ML suspension 5 mL (has no administration in time range)  acetaminophen (TYLENOL) tablet 650 mg (has no administration in time range)  oxyCODONE (Oxy IR/ROXICODONE) immediate release tablet 5 mg (has no administration in time range)  traZODone (DESYREL) tablet 50 mg (has no administration in time range)  senna (SENOKOT) tablet 8.6 mg (has no administration in time range)  ondansetron (ZOFRAN) tablet 4 mg (has no administration in time range)    Or  ondansetron (ZOFRAN) injection 4 mg (has no administration in time range)  ascorbic acid (VITAMIN C) tablet 500 mg (has no administration in time range)  zinc sulfate capsule 220 mg (has no administration in time range)  furosemide (LASIX) tablet 80 mg (has no administration in time range)  isosorbide mononitrate (IMDUR) 24 hr tablet 120 mg (has no administration in time range)   metoprolol tartrate (LOPRESSOR) tablet 100 mg (has no administration in time range)  nitroGLYCERIN (NITROLINGUAL) 0.4 MG/SPRAY spray 1 spray (has no administration in time range)  diazepam (VALIUM) tablet 5 mg (has no administration in time range)  escitalopram (LEXAPRO) tablet 20 mg (has no administration in time range)  modafinil (PROVIGIL) tablet 200 mg (has no administration in time range)  insulin regular human CONCENTRATED (HUMULIN R) 500 UNIT/ML KwikPen 80 Units (has no administration  in time range)  pantoprazole (PROTONIX) EC tablet 40 mg (has no administration in time range)  apixaban (ELIQUIS) tablet 5 mg (has no administration in time range)  gabapentin (NEURONTIN) capsule 300 mg (has no administration in time range)  fluticasone furoate-vilanterol (BREO ELLIPTA) 100-25 MCG/ACT 1 puff (has no administration in time range)    And  umeclidinium bromide (INCRUSE ELLIPTA) 62.5 MCG/ACT 1 puff (has no administration in time range)  insulin aspart (novoLOG) injection 0-20 Units (has no administration in time range)  aspirin chewable tablet 324 mg (324 mg Oral Given 10/12/22 1045)  methylPREDNISolone sodium succinate (SOLU-MEDROL) 125 mg/2 mL injection 125 mg (125 mg Intravenous Given 10/12/22 1154)    ED Course/ Medical Decision Making/ A&P This patient with a Hx of multiple medical issues including obesity, heart failure, hypertension, diabetes, COPD presents to the ED for concern of fatigue, dyspnea, this involves an extensive number of treatment options, and is a complaint that carries with it a high risk of complications and morbidity.    The differential diagnosis includes COPD exacerbation, CHF, pneumonia, bacteremia, sepsis, COVID, likely multifactorial etiology   Social Determinants of Health:  Admit obesity, home oxygen dependency  Additional history obtained:  Additional history and/or information obtained from family members at bedside, notable for HPI   After the  initial evaluation, orders, including: Labs monitoring bronchodilator continuous were initiated.   Patient placed on Cardiac and Pulse-Oximetry Monitors. The patient was maintained on a cardiac monitor.  The cardiac monitored showed an rhythm of 110 sinus tach abnormal The patient was also maintained on pulse oximetry. The readings were typically 92% 3 L abnormal   On repeat evaluation of the patient improved  Lab Tests:  I personally interpreted labs.  The pertinent results include: COVID-positive  Imaging Studies ordered:  I independently visualized and interpreted imaging which showed system abnormalities including effusion bilaterally on x-ray I agree with the radiologist interpretation  ConsDispostion / Final MDM:  After consideration of the diagnostic results and the patient's response to treatment, male presents with respiratory distress.  History suggestive of multifactorial etiology of patient's findings are consistent with this with suspicion for COVID complicating COPD with new oxygen requirement.  Patient started on bronchodilators I discussed his case with our pharmacist, and internal medicine.  Patient started on appropriate antiviral regimen given his history of anticoagulation, required admission for continuous monitoring, management. CRITICAL CARE Performed by: Carmin Muskrat Total critical care time: 35 minutes Critical care time was exclusive of separately billable procedures and treating other patients. Critical care was necessary to treat or prevent imminent or life-threatening deterioration. Critical care was time spent personally by me on the following activities: development of treatment plan with patient and/or surrogate as well as nursing, discussions with consultants, evaluation of patient's response to treatment, examination of patient, obtaining history from patient or surrogate, ordering and performing treatments and interventions, ordering and review of  laboratory studies, ordering and review of radiographic studies, pulse oximetry and re-evaluation of patient's condition.  Final Clinical Impression(s) / ED Diagnoses Final diagnoses:  Respiratory distress  COVID     Carmin Muskrat, MD 10/12/22 1527    Carmin Muskrat, MD 10/12/22 [phone]

## 2022-10-12 NOTE — Assessment & Plan Note (Signed)
-   Currently stable, resuming home medication with exception of lisinopril

## 2022-10-12 NOTE — Assessment & Plan Note (Signed)
-   We will holding statins to reduce risk of rhabdomyolysis somewhat Paxlovid

## 2022-10-12 NOTE — Assessment & Plan Note (Signed)
-  Still requiring 4 L oxygen, satting 97% -Shortness of breath with minimal exertion  -SARS-CoV-2 positive -Underlying history of COPD -Morbid obesity with likely obstructive sleep apnea, CHF  Chronically O2 dependent 3 L at baseline  Acute on chronic respiratory failure multifactorial as mentioned above -On admission he was hypoxic, required up to 5 L of oxygen via nasal cannula, -Maintaining O2 sat 88-92% --will titrate supplemental oxygen down -Continue IV steroids -Mucolytic's-including Tussionex  Treating COVID: candidate for  (due to interaction, his Eliquis has been switched to Lovenox subcu for now) -Isolation, Continue supportive therapy

## 2022-10-12 NOTE — Assessment & Plan Note (Signed)
-   Stable resuming home medication: Including isosorbide, metoprolol, Will hold lisinopril

## 2022-10-13 DIAGNOSIS — J9621 Acute and chronic respiratory failure with hypoxia: Secondary | ICD-10-CM | POA: Diagnosis not present

## 2022-10-13 LAB — COMPREHENSIVE METABOLIC PANEL
ALT: 24 U/L (ref 0–44)
AST: 34 U/L (ref 15–41)
Albumin: 3.5 g/dL (ref 3.5–5.0)
Alkaline Phosphatase: 50 U/L (ref 38–126)
Anion gap: 13 (ref 5–15)
BUN: 12 mg/dL (ref 6–20)
CO2: 24 mmol/L (ref 22–32)
Calcium: 8.6 mg/dL — ABNORMAL LOW (ref 8.9–10.3)
Chloride: 104 mmol/L (ref 98–111)
Creatinine, Ser: 0.97 mg/dL (ref 0.61–1.24)
GFR, Estimated: >60 mL/min (ref >60)
Glucose, Bld: 283 mg/dL — ABNORMAL HIGH (ref 70–99)
Potassium: 3.7 mmol/L (ref 3.5–5.1)
Sodium: 141 mmol/L (ref 135–145)
Total Bilirubin: 1 mg/dL (ref 0.3–1.2)
Total Protein: 7.2 g/dL (ref 6.5–8.1)

## 2022-10-13 LAB — CBC
HCT: 47.5 % (ref 39.0–52.0)
Hemoglobin: 16.1 g/dL (ref 13.0–17.0)
MCH: 31.8 pg (ref 26.0–34.0)
MCHC: 33.9 g/dL (ref 30.0–36.0)
MCV: 93.9 fL (ref 80.0–100.0)
Platelets: 215 10*3/uL (ref 150–400)
RBC: 5.06 MIL/uL (ref 4.22–5.81)
RDW: 12.6 % (ref 11.5–15.5)
WBC: 3.4 10*3/uL — ABNORMAL LOW (ref 4.0–10.5)
nRBC: 0 % (ref 0.0–0.2)

## 2022-10-13 LAB — GLUCOSE, CAPILLARY
Glucose-Capillary: 153 mg/dL — ABNORMAL HIGH (ref 70–99)
Glucose-Capillary: 250 mg/dL — ABNORMAL HIGH (ref 70–99)
Glucose-Capillary: 277 mg/dL — ABNORMAL HIGH (ref 70–99)
Glucose-Capillary: 171 mg/dL — ABNORMAL HIGH (ref 70–99)
Glucose-Capillary: 75 mg/dL (ref 70–99)
Glucose-Capillary: 105 mg/dL — ABNORMAL HIGH (ref 70–99)

## 2022-10-13 LAB — C-REACTIVE PROTEIN: CRP: 7 mg/dL — ABNORMAL HIGH (ref <1.0)

## 2022-10-13 LAB — D-DIMER, QUANTITATIVE: D-Dimer, Quant: 0.37 ug/mL-FEU (ref 0.00–0.50)

## 2022-10-13 MED ORDER — PROSOURCE PLUS PO LIQD
30.0000 mL | Freq: Two times a day (BID) | ORAL | Status: DC
  Administered 2022-10-13 – 2022-10-14 (×2): 30 mL via ORAL
  Filled 2022-10-13 (×3): qty 30

## 2022-10-13 NOTE — TOC Transition Note (Signed)
Transition of Care [facility]) - CM/SW Discharge Note   Patient Details  Name: [name] MRN: [id number] Date of Birth: 07/26/1961  Transition of Care [facility]) CM/SW Contact:  Boneta Lucks, RN Phone Number: 10/13/2022, 1:37 PM   Clinical Narrative:   Patient admitted with COVID, has a high risk for readmission. Patient has a nephew that lives with him. Patient drives himself and has a cane to use if needed. He wears a CPAP for sleep. He is ready to discharge home with no needs.     Final next level of care: Home/Self Care Barriers to Discharge: Continued Medical Work up   Discharge Plan and [place] no needs  Social Determinants of Health (SDOH) Interventions Housing Interventions: Intervention Not Indicated   Readmission Risk Interventions    10/13/2022    1:36 PM  Readmission Risk Prevention Plan  Transportation Screening Complete  PCP or Specialist Appt within 3-5 Days Not Complete  HRI or [place] Complete  Social Work Consult for [place] Planning/Counseling Complete  Palliative Care Screening Not Applicable  Medication Review [employer] photographer) Complete

## 2022-10-13 NOTE — Progress Notes (Signed)
   10/13/22 1713  Assess: MEWS Score  Temp (!) 97.2 F (36.2 C)  BP (!) 97/52  MAP (mmHg) 65  Pulse Rate 84  Resp (!) 22  Level of Consciousness Alert  SpO2 91 %  O2 Device Nasal Cannula  O2 Flow Rate (L/min) 4 L/min  Assess: MEWS Score  MEWS Temp 0  MEWS Systolic 1  MEWS Pulse 0  MEWS RR 1  MEWS LOC 0  MEWS Score 2  MEWS Score Color Yellow  Assess: if the MEWS score is Yellow or Red  Were vital signs taken at a resting state? Yes  Focused Assessment Change from prior assessment (see assessment flowsheet)  Does the patient meet 2 or more of the SIRS criteria? No  MEWS guidelines implemented *See Row Information* No, vital signs rechecked  Document  Patient Outcome Other (Comment) (rechecked bp manually and stable)  Progress note created (see row info) Yes  Assess: SIRS CRITERIA  SIRS Temperature  0  SIRS Pulse 0  SIRS Respirations  1  SIRS WBC 0  SIRS Score Sum  1

## 2022-10-13 NOTE — Progress Notes (Signed)
Hospital

## 2022-10-13 NOTE — Progress Notes (Signed)
PROGRESS NOTE    Patient: [name]                            PCP: Sharilyn Sites, MD                    DOB: 1961-05-28            DOA: 10/12/2022 BJY:[id number]             DOS: 10/13/2022, 10:17 AM   LOS: 1 day   Date of Service: The patient was seen and examined on 10/13/2022  Subjective:   The patient was seen and examined this morning. Blood pressure stable, still tachycardic, tachypneic, temp 97.0  Requiring 4 L of oxygen, satting 97%, excessive shortness of breath with minimal exertion  Brief Narrative:   [name] is a 61 year old male with multiple comorbidities including COPD, O2 dependent, former smoker, morbid obesity, DM 2, hypertension, anxiety, depression, P A-fib, CHF... Presented with progressive shortness of breath and generalized weaknesses. Shortness of breath is progressively getting worse over the past few days, along with generalized weaknesses, he has difficulty with his activities daily living and function now. Also reporting of substernal intermittent chest pain which has been exacerbated by cough, shortness of breath and past few days.  Chest pains were sternal and intermittent with no radiation or diaphoresis. Denies of having the chest pain at this time   ED course:  Blood pressure 106/71, pulse 100, temperature 98.5 F (36.9 C), temperature source Oral, resp. rate 17, height 5\' 6"  (1.676 m), weight (!) 140.6 kg, SpO2 95 %.  On 5 L of oxygen  CMP: Within normal limits exception of potassium 3.1, glucose 143, calcium 8.4 CBC: Normal limits with WBC of 3.7  SARS-CoV-2 positive  Patient was started on breathing treatment, IV steroids... Discussed with pharmacy regarding initiation of Paxlovid  Requested to be admitted for acute on chronic respiratory failure due to [place]:   Principal Problem:   Acute on chronic respiratory failure with hypoxia ([place]) Active Problems:   Chest pain   Hypokalemia   DM  hyperosmolarity type II, uncontrolled (HCC)   Mixed hyperlipidemia   Essential hypertension, benign   CAD (coronary artery disease)   PAF (paroxysmal atrial fibrillation) (HCC)   GERD (gastroesophageal reflux disease)   Barrett's esophagus   Depression   Obesity, unspecified   COPD with acute exacerbation (HCC)   Chronic kidney disease   H/O Stroke   SOB (shortness of breath)   Diastolic congestive heart failure, NYHA class 2 ([name])   COVID-19 virus infection     Assessment and Plan: * Acute on chronic respiratory failure with hypoxia (HCC) -Still requiring 4 L oxygen, satting 97% -Shortness of breath with minimal exertion  -SARS-CoV-2 positive -Underlying history of COPD -Morbid obesity with likely obstructive sleep apnea, CHF  Chronically O2 dependent 3 L at baseline  Acute on chronic respiratory failure multifactorial as mentioned above -On admission he was hypoxic, required up to 5 L of oxygen via nasal cannula, -Maintaining O2 sat 88-92% --will titrate supplemental oxygen down -Continue IV steroids -Mucolytic's-including Tussionex  Treating COVID: candidate for  (due to interaction, his Eliquis has been switched to Lovenox subcu for now) -Isolation, Continue supportive therapy  Chest pain -Atypical chest pain -Intermittent chest pain, denies any chest pain at this time -Atypical, exacerbated by respiratory distress, cough, shortness of breath -Negative for elevation  of troponin last Trope 7 -No changes in EKG  -As needed nitroglycerin, aspirin, continue home medication   DM hyperosmolarity type II, uncontrolled ([place]) - Uncontrolled DM type II last A1c was 8.9, on 08/09/2022 -Rechecking A1c -Checking CBG QA CHS with SSI coverage -On concentrated insulin-will try to mimic and initiate Otherwise will substitute with long-acting insulin   Hypokalemia - Monitoring repleting orally  COVID-19 virus infection - Continue supportive therapy, supplemental  oxygen, -Consulting pharmacy, assessing patient if he would be a candidate for oral Paxlovid The benefit and initiation of Paxlovid outweighs the risks  (Due to interaction with Paxlovid - Eliquis has been held, therapeutic subcu Lovenox, also statin on hold) -Resuming inhaler substituted by pulmonologist Dr. Elsworth Soho  Diastolic congestive heart failure, NYHA class 2 ([name]) - Monitoring daily weight, I's and O's  -Patient seems to be on p.o. Lasix of 80 mg daily -  SOB (shortness of breath) Due to COPD exacerbation, exacerbated in SARS-CoV-2 infection -Treatment as above  H/O Stroke - Continue statins, patient seems to be on Eliquis but is on hold We will investigate further  Chronic kidney disease - Chronic likely stage Ia  - Monitoring closely, -avoiding nephrotoxins -Stable BUN/creatinine Lab Results  Component Value Date   CREATININE 0.97 10/13/2022   CREATININE 1.17 10/12/2022   CREATININE 1.22 04/26/2022     COPD with acute exacerbation (HCC) - Chronically O2 dependent 3 L at baseline -requiring 5 L of oxygen, to maintain O2 sat of 95%, -Continue IV steroids, bronchodilators,  Obesity, unspecified - Monitoring, advised on weight loss healthy diet and exercise Body mass index is 50.04 kg/m.   Depression -Stable  - will resume home medications: Including Valium, Lexapro,  Barrett's esophagus -Stable continue PPI  GERD (gastroesophageal reflux disease) Continue PPI  PAF (paroxysmal atrial fibrillation) (HCC) - Currently normal sinus on metoprolol -Supposed to be on Eliquis  CAD (coronary artery disease) - Stable resuming home medication: Including isosorbide, metoprolol, Will hold lisinopril  Essential hypertension, benign - Currently stable, resuming home medication with exception of lisinopril  Mixed hyperlipidemia - We will holding statins to reduce risk of rhabdomyolysis somewhat Paxlovid  Cerebral infarction (HCC)-resolved as of 10/12/2022 -     ------------------------------------------------------------------------------------------------------------------------------------ Nutritional status:  The patient's BMI is: Body mass index is 43.4 kg/m. I agree with the assessment and plan as outlined -------------------------------------------------------------------------------------------------------------------------------------  DVT prophylaxis:  SCDs Start: 10/12/22 1250   Code Status:   Code Status: Full Code  Family Communication: Stepson at bedside updated The above findings and plan of care has been discussed with patient (and family)  in detail,  they expressed understanding and agreement of above. -Advance care planning has been discussed.    Disposition: From home anticipating discharging home in 1-2 days  Admission status:   Status is: Inpatient Remains inpatient appropriate because: Needing high flow oxygen, breathing treatments,     Procedures:   No admission procedures for hospital encounter.   Antimicrobials:  Anti-infectives (From admission, onward)    Start     Dose/Rate Route Frequency Ordered Stop   10/12/22 1345  nirmatrelvir/ritonavir EUA (PAXLOVID) 3 tablet        3 tablet Oral 2 times daily 10/12/22 1334 10/17/22 0959   10/12/22 1300  nirmatrelvir/ritonavir EUA (PAXLOVID) 3 tablet  Status:  Discontinued        3 tablet Oral 2 times daily 10/12/22 1203 10/12/22 1312        Medication:   arformoterol  15 mcg Nebulization BID   ascorbic acid  500 mg Oral Daily   budesonide (PULMICORT) nebulizer solution  0.5 mg Nebulization BID   chlorpheniramine-HYDROcodone  5 mL Oral Q12H   diazepam  5 mg Oral BID   enoxaparin (LOVENOX) injection  140 mg Subcutaneous Q12H   escitalopram  20 mg Oral Daily   furosemide  80 mg Oral q AM   gabapentin  300 mg Oral TID   guaiFENesin-dextromethorphan  10 mL Oral Q8H   insulin aspart  0-20 Units Subcutaneous TID WC   insulin regular human  CONCENTRATED  80 Units Subcutaneous TID WC   isosorbide mononitrate  120 mg Oral Daily   methylPREDNISolone (SOLU-MEDROL) injection  1 mg/kg Intravenous Q12H   Followed by   Derrill Memo ON 10/16/2022] predniSONE  50 mg Oral Daily   metoprolol tartrate  100 mg Oral BID   modafinil  200 mg Oral BID   nirmatrelvir/ritonavir EUA  3 tablet Oral BID   pantoprazole  40 mg Oral Daily   potassium chloride SA  20 mEq Oral BID   revefenacin  175 mcg Nebulization Daily   senna  1 tablet Oral BID   zinc sulfate  220 mg Oral Daily    acetaminophen, albuterol, nitroGLYCERIN, ondansetron **OR** ondansetron (ZOFRAN) IV, oxyCODONE   Objective:   Vitals:   10/12/22 2014 10/12/22 2245 10/13/22 0517 10/13/22 0735  BP: (!) 107/56  127/88   Pulse: (!) 110 (!) 108 89 (!) 101  Resp: (!) 22 18 16  (!) 22  Temp: 98.5 F (36.9 C)  (!) 97 F (36.1 C)   TempSrc: Oral     SpO2: 93% 92% 93% 97%  Weight:   (!) 137.2 kg   Height:        Intake/Output Summary (Last 24 hours) at 10/13/2022 1017 Last data filed at 10/13/2022 0559 Gross per 24 hour  Intake 720 ml  Output --  Net 720 ml   Filed Weights   10/12/22 0956 10/12/22 1500 10/13/22 0517  Weight: (!) 140.6 kg (!) 139.2 kg (!) 137.2 kg     Examination:   Physical Exam  Constitution:  Alert, cooperative, no distress,  Appears calm and comfortable  Psychiatric:   Normal and stable mood and affect, cognition intact,   HEENT:        Normocephalic, PERRL, otherwise with in Normal limits  Chest:         Chest symmetric Cardio vascular:  S1/S2, RRR, No murmure, No Rubs or Gallops  pulmonary: Mildly labored respiration, shortness of breath with exertion, diffuse rhonchi, wheezing, negative any crackles, positive breath sounds diffuse wheezing in all lobes Abdomen: Soft, non-tender, non-distended, bowel sounds,no masses, no organomegaly Muscular skeletal: Limited exam - in bed, able to move all 4 extremities,   Neuro: CNII-XII intact. , normal motor and  sensation, reflexes intact  Extremities: No pitting edema lower extremities, +2 pulses  Skin: Dry, warm to touch, negative for any Rashes, No open wounds Wounds: per nursing documentation   ------------------------------------------------------------------------------------------------------------------------------------------    LABs:     Latest Ref Rng & Units 10/13/2022    4:34 AM 10/12/2022   10:21 AM 01/30/2019    1:20 PM  CBC  WBC 4.0 - 10.5 K/uL 3.4  3.7  11.8   Hemoglobin 13.0 - 17.0 g/dL 16.1  15.6  15.9   Hematocrit 39.0 - 52.0 % 47.5  46.0  45.9   Platelets 150 - 400 K/uL 215  205  235       Latest Ref Rng & Units 10/13/2022  4:34 AM 10/12/2022   10:21 AM 04/26/2022    1:53 PM  CMP  Glucose 70 - 99 mg/dL 283  143  108   BUN 6 - 20 mg/dL 12  11  11    Creatinine 0.61 - 1.24 mg/dL 0.97  1.17  1.22   Sodium 135 - 145 mmol/L 141  141  142   Potassium 3.5 - 5.1 mmol/L 3.7  3.1  4.0   Chloride 98 - 111 mmol/L 104  104  103   CO2 22 - 32 mmol/L 24  23  23    Calcium 8.9 - 10.3 mg/dL 8.6  8.4  9.5   Total Protein 6.5 - 8.1 g/dL 7.2   6.6   Total Bilirubin 0.3 - 1.2 mg/dL 1.0   0.5   Alkaline Phos 38 - 126 U/L 50   58   AST 15 - 41 U/L 34   21   ALT 0 - 44 U/L 24   19        Micro Results Recent Results (from the past 240 hour(s))  Resp panel by RT-PCR (RSV, Flu A&B, Covid) Anterior Nasal Swab     Status: Abnormal   Collection Time: 10/12/22 11:11 AM   Specimen: Anterior Nasal Swab  Result Value Ref Range Status   SARS Coronavirus 2 by RT PCR POSITIVE (A) NEGATIVE Final    Comment: (NOTE) SARS-CoV-2 target nucleic acids are DETECTED.  The SARS-CoV-2 RNA is generally detectable in upper respiratory specimens during the acute phase of infection. Positive results are indicative of the presence of the identified virus, but do not rule out bacterial infection or co-infection with other pathogens not detected by the test. Clinical correlation with patient history  and other diagnostic information is necessary to determine patient infection status. The expected result is Negative.  Fact Sheet for Patients: EntrepreneurPulse.com.au  Fact Sheet for Healthcare Providers: IncredibleEmployment.be  This test is not yet approved or cleared by the [place] FDA and  has been authorized for detection and/or diagnosis of SARS-CoV-2 by FDA under an Emergency Use Authorization (EUA).  This EUA will remain in effect (meaning this test can be used) for the duration of  the COVID-19 declaration under Section 564(b)(1) of the A ct, 21 U.S.C. section 360bbb-3(b)(1), unless the authorization is terminated or revoked sooner.     Influenza A by PCR NEGATIVE NEGATIVE Final   Influenza B by PCR NEGATIVE NEGATIVE Final    Comment: (NOTE) The Xpert Xpress SARS-CoV-2/FLU/RSV plus assay is intended as an aid in the diagnosis of influenza from Nasopharyngeal swab specimens and should not be used as a sole basis for treatment. Nasal washings and aspirates are unacceptable for Xpert Xpress SARS-CoV-2/FLU/RSV testing.  Fact Sheet for Patients: EntrepreneurPulse.com.au  Fact Sheet for Healthcare Providers: IncredibleEmployment.be  This test is not yet approved or cleared by the [place] FDA and has been authorized for detection and/or diagnosis of SARS-CoV-2 by FDA under an Emergency Use Authorization (EUA). This EUA will remain in effect (meaning this test can be used) for the duration of the COVID-19 declaration under Section 564(b)(1) of the Act, 21 U.S.C. section 360bbb-3(b)(1), unless the authorization is terminated or revoked.     Resp Syncytial Virus by PCR NEGATIVE NEGATIVE Final    Comment: (NOTE) Fact Sheet for Patients: EntrepreneurPulse.com.au  Fact Sheet for Healthcare Providers: IncredibleEmployment.be  This test is not yet  approved or cleared by the [place] FDA and has been authorized for detection and/or diagnosis of SARS-CoV-2 by  FDA under an Emergency Use Authorization (EUA). This EUA will remain in effect (meaning this test can be used) for the duration of the COVID-19 declaration under Section 564(b)(1) of the Act, 21 U.S.C. section 360bbb-3(b)(1), unless the authorization is terminated or revoked.  Performed at [facility], [address], [place], [place] [address]     Radiology Reports DG Chest Portable 1 View  Result Date: 10/12/2022 CLINICAL DATA:  Chest pain EXAM: PORTABLE CHEST 1 VIEW COMPARISON:  Previous studies including the examination of 05/14/2022 and CT done on 08/13/2022 FINDINGS: Transverse diameter of heart is slightly increased. There is poor inspiration. There is prominence of interstitial markings in parahilar regions and lower lung fields. There is slight improvement in aeration in right parahilar region. There is no new focal pulmonary consolidation. There is blunting of both lateral CP angles. There is no pneumothorax. IMPRESSION: There is residual prominence of interstitial markings in parahilar regions and lower lung fields suggesting interstitial edema or interstitial pneumonia. Part of this finding may suggest underlying scarring. There is interval appearance of small bilateral pleural effusions, more so on the right side. Electronically Signed   By: Elmer Picker M.D.   On: 10/12/2022 10:41    SIGNED: Deatra James, MD, FHM. [facility],  Pager (please use [email or web address] to page/text) Please use Epic Secure Chat for non-urgent communication (7AM-7PM)  If 7PM-7AM, please contact night-coverage [email or web address], 10/13/2022, 10:17 AM

## 2022-10-13 NOTE — Progress Notes (Signed)
Initial Nutrition Assessment  DOCUMENTATION CODES:   Morbid obesity  INTERVENTION:  CHO modified diet  ProSource Plus 30 ml BID  Diabetes Diet education attached to AVS  NUTRITION DIAGNOSIS:   Increased nutrient needs related to acute illness (COVID+) as evidenced by estimated needs.  GOAL:  Patient will meet greater than or equal to 90% of their needs  MONITOR:  PO intake, Labs, Weight trends, Supplement acceptance  REASON FOR ASSESSMENT:   Consult  Assess nutrition status   ASSESSMENT: Patient is a 61 yo male presents with chest pain and dyspnea. Hx of CHF, COPD, DM, HTN and pulmonary nodule. Patient requiring 4L of O2 currently.   Patient is eating well 75-100% of meals. Lives alone but has a step son who assist him when needed. CHO modified diet encouraged. Patient feeds himself and is able to prepare his meals when well.   Weight loss 4% over the past 2 months per review (not significant for time frame)  Medications: Vitamin C, Lasix, Novolog, Protonix, Klor-Con, Senokot, Zinc, Senna.   CBG (last 3)  Recent Labs    10/12/22 2149 10/13/22 0756 10/13/22 1159  GLUCAP 298* 250* 277*    08/09/22: A1C-8.9%     Latest Ref Rng & Units 10/13/2022    4:34 AM 10/12/2022   10:21 AM 04/26/2022    1:53 PM  BMP  Glucose 70 - 99 mg/dL 283  143  108   BUN 6 - 20 mg/dL 12  11  11    Creatinine 0.61 - 1.24 mg/dL 0.97  1.17  1.22   BUN/Creat Ratio 9 - 20   9   Sodium 135 - 145 mmol/L 141  141  142   Potassium 3.5 - 5.1 mmol/L 3.7  3.1  4.0   Chloride 98 - 111 mmol/L 104  104  103   CO2 22 - 32 mmol/L 24  23  23    Calcium 8.9 - 10.3 mg/dL 8.6  8.4  9.5       NUTRITION - FOCUSED PHYSICAL EXAM:  Flowsheet Row Most Recent Value  Orbital Region No depletion  Upper Arm Region No depletion  Thoracic and Lumbar Region No depletion  Buccal Region No depletion  Temple Region No depletion  Clavicle Bone Region No depletion  Clavicle and Acromion Bone Region No depletion   Dorsal Hand No depletion  Edema (RD Assessment) Mild  Hair Reviewed  Eyes Reviewed  Mouth Reviewed  Skin Reviewed  Nails Reviewed       Diet Order:   Diet Order             Diet Carb Modified Fluid consistency: Thin; Room service appropriate? Yes  Diet effective now                   EDUCATION NEEDS:  Education needs have been addressed  Skin:  Skin Assessment: Reviewed RN Assessment  Last BM:  12/12  Height:   Ht Readings from Last 1 Encounters:  10/12/22 5\' 10"  (1.778 m)    Weight:   Wt Readings from Last 1 Encounters:  10/13/22 (!) 137.2 kg    Ideal Body Weight:   75 kg  BMI:  Body mass index is 43.4 kg/m.  Estimated Nutritional Needs:   Kcal:  2400-2600  Protein:  120-130 gr  Fluid:  < 2 liters daily   Colman Cater MS,RD,CSG,LDN Contact: Shea Evans

## 2022-10-13 NOTE — Inpatient Diabetes Management (Signed)
Inpatient Diabetes Program Recommendations  AACE/ADA: New Consensus Statement on Inpatient Glycemic Control (2015)  Target Ranges:  Prepandial:   less than 140 mg/dL      Peak postprandial:   less than 180 mg/dL (1-2 hours)      Critically ill patients:  140 - 180 mg/dL   Lab Results  Component Value Date   GLUCAP 250 (H) 10/13/2022   HGBA1C 8.9 (A) 08/09/2022    Review of Glycemic Control  Latest Reference Range & Units 10/12/22 17:10 10/12/22 21:49 10/13/22 07:56  Glucose-Capillary 70 - 99 mg/dL 274 (H) 298 (H) 250 (H)  (H): Data is abnormally high Diabetes history: Type 2 DM Outpatient Diabetes medications: U-500- 80 units TID Current orders for Inpatient glycemic control: U-500- 80 units TID, Novolog 0-20 units TID Solumedrol 140 mg BID  Inpatient Diabetes Program Recommendations:    While on steroids: Consider increasing U-500 90 units TID.  Thanks, Bronson Curb, MSN, RNC-OB Diabetes Coordinator [phone] (8a-5p)

## 2022-10-14 DIAGNOSIS — J9621 Acute and chronic respiratory failure with hypoxia: Secondary | ICD-10-CM | POA: Diagnosis not present

## 2022-10-14 LAB — D-DIMER, QUANTITATIVE: D-Dimer, Quant: 0.33 ug/mL-FEU (ref 0.00–0.50)

## 2022-10-14 LAB — COMPREHENSIVE METABOLIC PANEL
ALT: 28 U/L (ref 0–44)
AST: 45 U/L — ABNORMAL HIGH (ref 15–41)
Albumin: 3.7 g/dL (ref 3.5–5.0)
Alkaline Phosphatase: 49 U/L (ref 38–126)
Anion gap: 12 (ref 5–15)
BUN: 21 mg/dL — ABNORMAL HIGH (ref 6–20)
CO2: 26 mmol/L (ref 22–32)
Calcium: 9 mg/dL (ref 8.9–10.3)
Chloride: 102 mmol/L (ref 98–111)
Creatinine, Ser: 1.15 mg/dL (ref 0.61–1.24)
GFR, Estimated: >60 mL/min (ref >60)
Glucose, Bld: 98 mg/dL (ref 70–99)
Potassium: 3.2 mmol/L — ABNORMAL LOW (ref 3.5–5.1)
Sodium: 140 mmol/L (ref 135–145)
Total Bilirubin: 0.7 mg/dL (ref 0.3–1.2)
Total Protein: 7.5 g/dL (ref 6.5–8.1)

## 2022-10-14 LAB — CBC
HCT: 49.3 % (ref 39.0–52.0)
Hemoglobin: 16.6 g/dL (ref 13.0–17.0)
MCH: 32.1 pg (ref 26.0–34.0)
MCHC: 33.7 g/dL (ref 30.0–36.0)
MCV: 95.4 fL (ref 80.0–100.0)
Platelets: 313 10*3/uL (ref 150–400)
RBC: 5.17 MIL/uL (ref 4.22–5.81)
RDW: 12.8 % (ref 11.5–15.5)
WBC: 15.2 10*3/uL — ABNORMAL HIGH (ref 4.0–10.5)
nRBC: 0 % (ref 0.0–0.2)

## 2022-10-14 LAB — C-REACTIVE PROTEIN: CRP: 2.9 mg/dL — ABNORMAL HIGH (ref <1.0)

## 2022-10-14 LAB — HEMOGLOBIN A1C
Hgb A1c MFr Bld: 7.5 % — ABNORMAL HIGH (ref 4.8–5.6)
Mean Plasma Glucose: 169 mg/dL

## 2022-10-14 LAB — GLUCOSE, CAPILLARY
Glucose-Capillary: 181 mg/dL — ABNORMAL HIGH (ref 70–99)
Glucose-Capillary: 338 mg/dL — ABNORMAL HIGH (ref 70–99)
Glucose-Capillary: 338 mg/dL — ABNORMAL HIGH (ref 70–99)

## 2022-10-14 MED ORDER — NIRMATRELVIR/RITONAVIR (PAXLOVID)TABLET: 3.0000 | ORAL_TABLET | Freq: Two times a day (BID) | ORAL | 0 refills | Status: AC

## 2022-10-14 MED ORDER — ALBUTEROL SULFATE (2.5 MG/3ML) 0.083% IN NEBU: 2.5000 mg | INHALATION_SOLUTION | Freq: Four times a day (QID) | RESPIRATORY_TRACT | 12 refills | Status: DC | PRN

## 2022-10-14 MED ORDER — SENNOSIDES-DOCUSATE SODIUM 8.6-50 MG PO TABS: 2.0000 | ORAL_TABLET | Freq: Every day | ORAL | 1 refills | Status: DC

## 2022-10-14 MED ORDER — ZINC SULFATE 220 (50 ZN) MG PO CAPS: 220.0000 mg | ORAL_CAPSULE | Freq: Every day | ORAL | 0 refills | Status: DC

## 2022-10-14 MED ORDER — PREDNISONE 20 MG PO TABS: 40.0000 mg | ORAL_TABLET | Freq: Every day | ORAL | 0 refills | Status: AC

## 2022-10-14 MED ORDER — ASCORBIC ACID 500 MG PO TABS: 500.0000 mg | ORAL_TABLET | Freq: Every day | ORAL | 0 refills | Status: DC

## 2022-10-14 MED ORDER — POTASSIUM CHLORIDE CRYS ER 20 MEQ PO TBCR
40.0000 meq | EXTENDED_RELEASE_TABLET | ORAL | Status: DC
  Filled 2022-10-14: qty 2

## 2022-10-14 MED ORDER — POTASSIUM CHLORIDE CRYS ER 20 MEQ PO TBCR: 20.0000 meq | EXTENDED_RELEASE_TABLET | Freq: Every day | ORAL | 0 refills | Status: AC

## 2022-10-14 MED ORDER — APIXABAN 5 MG PO TABS: 5.0000 mg | ORAL_TABLET | Freq: Two times a day (BID) | ORAL | 1 refills | Status: AC

## 2022-10-14 NOTE — Progress Notes (Signed)
pt remained at 90-91 for 3 mins on RA then dropped at 84 at the 4 min mark.Marland Kitchen and then back up to 90 when walking back to bed at 5 min mark message sent to attending via requested for determination of pt O2 sats while ambulating

## 2022-10-14 NOTE — Discharge Instructions (Signed)
1) You are strongly advised to isolate/quarantine for at least 10 days from the date of your diagnosis with COVID-19 infection--please always wear a mask if you have to go outside the house  2)Please take medications as prescribed--- please note that there were some changes to your medications  3)you need oxygen at home at 3 L via nasal cannula continuously while awake and while asleep--- smoking or having open fires around oxygen can cause fire, significant injury and death  4)Repeat CBC and CMP -blood test with your primary care physician in about 10 days  5) please continue CPAP at bedtime

## 2022-10-14 NOTE — Discharge Summary (Signed)
[name], is a 61 y.o. male  DOB 18-May-1961  MRN [id number].  Admission date:  10/12/2022  Admitting Physician  Deatra James, MD  Discharge Date:  10/14/2022   Primary MD  Sharilyn Sites, MD  Recommendations for primary care physician for things to follow:   1) You are strongly advised to isolate/quarantine for at least 10 days from the date of your diagnosis with COVID-19 infection--please always wear a mask if you have to go outside the house  2)Please take medications as prescribed--- please note that there were some changes to your medications  3)you need oxygen at home at 3 L via nasal cannula continuously while awake and while asleep--- smoking or having open fires around oxygen can cause fire, significant injury and death  4)Repeat CBC and CMP -blood test with your primary care physician in about 10 days  5) please continue CPAP at bedtime  Admission Diagnosis  Respiratory distress [R06.03] COVID [U07.1] COVID-19 virus infection [U07.1]   Discharge Diagnosis  Respiratory distress [R06.03] COVID [U07.1] COVID-19 virus infection [U07.1]    Principal Problem:   Acute on chronic respiratory failure with hypoxia ([name]) Active Problems:   Chest pain   Hypokalemia   DM hyperosmolarity type II, uncontrolled ([name])   Mixed hyperlipidemia   Essential hypertension, benign   CAD (coronary artery disease)   PAF (paroxysmal atrial fibrillation) (HCC)   GERD (gastroesophageal reflux disease)   Barrett's esophagus   Depression   Obesity, unspecified   COPD with acute exacerbation ([name])   Chronic kidney disease   H/O Stroke   SOB (shortness of breath)   Diastolic congestive heart failure, NYHA class 2 ([name])   COVID-19 virus infection      Past Medical History:  Diagnosis Date   Abnormal myocardial perfusion study 01/01/2011   there a small to moderate sized inferobasal scar    Barrett's esophagus    Cataracts, bilateral    Chronic kidney disease    hx of kidney stones   Claudication ([name]) 11/16/2011   PV test perform shows normal   COPD (chronic obstructive pulmonary disease) ([name])    Depression    Diabetes ([name])    type 2 diabetes mellitus   Dysrhythmia    GERD (gastroesophageal reflux disease)    Glaucoma    Hernia of abdominal wall    History of radiation therapy    Right lung, SBRT- 04/16/21-04/30/21- Dr. Gery Pray   HTN (hypertension)    Hyperlipidemia    Morbid obesity ([place])    Myocardial infarction ([name]) 2008,2009,2009   OSA (obstructive sleep apnea)    on cpap   PAF (paroxysmal atrial fibrillation) ([name])    S/P colonoscopy 2009   3-4 mm transverse colon erosions likely secondary to  ASA   S/P endoscopy 10/2010   moderate erosive gastritis, Barrett's esophagus 1-2cm   Sleep apnea    SOB (shortness of breath) 11/03/2007   2D Echo EF 50%-55%   Stroke ([name]) 2017    Past Surgical History:  Procedure Laterality  Date   BIOPSY N/A 04/29/2015   Procedure: BIOPSY;  Surgeon: Danie Binder, MD;  Location: [facility];  Service: Endoscopy;  Laterality: N/A;   BIOPSY  08/01/2018   Procedure: BIOPSY;  Surgeon: Danie Binder, MD;  Location: [facility];  Service: Endoscopy;;  esophageal   BIOPSY  10/12/2021   Procedure: BIOPSY;  Surgeon: Eloise Harman, DO;  [facility];  Service: Endoscopy;;   CABG X 4  03/2008   CARDIAC CATHETERIZATION  2009   stent placement to the left circumflex a 2.25    CARDIAC CATHETERIZATION  07/08/2010   CARDIAC CATHETERIZATION N/A 11/02/2019   COLONOSCOPY WITH PROPOFOL N/A 09/27/2017   normal ileum, twenty 4 to 8 mm polyps in the sigmoid colon, descending colon, splenic flexure, transverse colon, ascending colon, cecum.  An additional three 2 to 4 mm polyps in the rectum and the descending colon.  Diverticulosis, internal hemorrhoids.  Surgical pathology found the polyps to be one fragment of hyperplastic  polyp and 22 fragments of tubular adenoma..  Recommended repeat colonoscop   COLONOSCOPY WITH PROPOFOL N/A 10/12/2021   Procedure: COLONOSCOPY WITH PROPOFOL;  Surgeon: Eloise Harman, DO;  [facility];  Service: Endoscopy;  Laterality: N/A;  1:30pm   CORONARY ARTERY BYPASS GRAFT  2008   4 vessels   CORONARY STENT PLACEMENT  12/29/12   CORONARY STENT PLACEMENT  12/2012   ESOPHAGEAL DILATION N/A 04/29/2015   Procedure: ESOPHAGEAL DILATION 15 mm, 16 mm;  Surgeon: Danie Binder, MD;  Location: [facility];  Service: Endoscopy;  Laterality: N/A;   ESOPHAGOGASTRODUODENOSCOPY  09/2011   Barrett's esophagus, no dysplasia on biopsies. Distal esophagitis. Status post dilation. Moderate gastritis and duodenitis, but biopsies benign. Next EGD in November 2015 for surveillance of Barrett's esophagus.   ESOPHAGOGASTRODUODENOSCOPY (EGD) WITH PROPOFOL N/A 04/29/2015   SLF: 1. Barretts esophagus 2. Moderate non-erosive gastritis.    ESOPHAGOGASTRODUODENOSCOPY (EGD) WITH PROPOFOL N/A 08/01/2018   Barrett's, repeat in 5 years. Empiric dilatation, mild gastritis   HERNIA REPAIR     ventral hernia repair   LEFT HEART CATHETERIZATION WITH CORONARY/GRAFT ANGIOGRAM N/A 12/29/2012   Procedure: LEFT HEART CATHETERIZATION WITH Beatrix Fetters;  Surgeon: Troy Sine, MD;  Location[facility] C[facility] LAB;  Service: Cardiovascular;  Laterality: N/A;   ORIF FIBULA FRACTURE Right 06/03/2015   Procedure: OPEN REDUCTION INTERNAL FIXATION (ORIF) DISTAL FIBULA  FRACTURE;  Surgeon: Garald Balding, MD;  Location: [facility];  Service: Orthopedics;  Laterality: Right;   PERCUTANEOUS CORONARY STENT INTERVENTION (PCI-S)  12/29/2012   Procedure: PERCUTANEOUS CORONARY STENT INTERVENTION (PCI-S);  Surgeon: Troy Sine, MD;  Location: [facility] CATH LAB;  Service: Cardiovascular;;   POLYPECTOMY  09/27/2017   Procedure: POLYPECTOMY;  Surgeon: Danie Binder, MD;  [facility];  Service: Endoscopy;;  cecal polyp, ascending  polyps x6, transverse colon polyps x6, splenic flexure polyps x2, descending colon polyps x6, sigmoid  colon polyp x1, rectal polyp x1    POLYPECTOMY  10/12/2021   Procedure: POLYPECTOMY;  Surgeon: Eloise Harman, DO;  [facility];  Service: Endoscopy;;   SAVORY DILATION  09/06/2011   Procedure: SAVORY DILATION;  Surgeon: Dorothyann Peng, MD;  [facility]: [facility];  Service: Endoscopy;  Laterality: N/A;  Dilated with 93m   SAVORY DILATION N/A 08/01/2018   Procedure: SAVORY DILATION;  Surgeon: FDanie Binder MD;  [facility];  Service: Endoscopy;  Laterality: N/A;     HPI  from the  history and physical done on the day of admission:   [name] is a 61 year old male with multiple comorbidities including COPD, O2 dependent, former smoker, morbid obesity, DM 2, hypertension, anxiety, depression, P A-fib, CHF... Presented with progressive shortness of breath and generalized weaknesses. Shortness of breath is progressively getting worse over the past few days, along with generalized weaknesses, he has difficulty with his activities daily living and function now. Also reporting of substernal intermittent chest pain which has been exacerbated by cough, shortness of breath and past few days.  Chest pains were sternal and intermittent with no radiation or diaphoresis. Denies of having the chest pain at this time     ED course:  Blood pressure 106/71, pulse 100, temperature 98.5 F (36.9 C), temperature source Oral, resp. rate 17, height _0  (1.676 m), weight (!) 140.6 kg, SpO2 95 %.  On 5 L of oxygen   CMP: Within normal limits exception of potassium 3.1, glucose 143, calcium 8.4 CBC: Normal limits with WBC of 3.7   SARS-CoV-2 positive   Patient was started on breathing treatment, IV steroids... Discussed with pharmacy regarding initiation of Paxlovid   Requested to be admitted for acute on chronic respiratory failure due to [place]     Patient Denies having:  Fever, Chills, Cough,Abd pain, N/V/D, headache, dizziness, lightheadedness,  Dysuria, Joint pain, rash, open wounds       Review of Systems: As per HPI, otherwise 10 point review of systems were negative.      Hospital Course:    Assessment and Plan:    Acute on chronic respiratory failure with hypoxia (HCC) -Hypoxia and oxygen requirement improved  -Patient oxygen requirement is back to baseline of 2 to 3 L at this time --at the time of discharge required 2 L at rest and 2 L with activity -No further dyspnea at rest, dyspnea on exertion significantly improved   -SARS-CoV-2 positive -Underlying history of COPD -Morbid obesity with likely obstructive sleep apnea, CHF -No acute exacerbation at this time -Hypoxia improved as above   Acute on chronic hypoxic respiratory failure multifactorial as mentioned above -On admission he was hypoxic, required up to 5 L of oxygen via nasal cannula, -Maintaining O2 sat 88-92% --will titrate supplemental oxygen down -Significantly improved with steroids bronchodilators and mucolytics -    Chest pain -Atypical chest pain---cough and COVID related -Intermittent chest pain, denies any chest pain at this time --Already ruled out for ACS by EKG and cardiac enzymes -As needed nitroglycerin, aspirin, continue home medication     DM hyperosmolarity type II, uncontrolled (HCC) -A1c 7.5 reflecting uncontrolled DM with hyperglycemia PTA -Anticipate worsening hyperglycemia with steroids -Continue preadmission regimen follow-up with PCP for further adjustments     Hypokalemia --Replaced   COVID-19 virus infection - Continue supportive therapy, supplemental oxygen, --Continue bronchodilators, anti-viral and mucolytics   Diastolic congestive heart failure, NYHA class 2 (HCC) -No acute exacerbation at this time -Resume home Lasix dose -   H/O Stroke - Continue statins, and Eliquis  Chronic kidney disease - Chronic likely stage Ia   -Follow-up with PCP for ongoing management  Discharge Condition: Stable, oxygen requirement is back to baseline  Follow UP   Follow-up Information     Sharilyn Sites, MD Follow up in 1 week(s).   Specialty: Family Medicine Contact information: [address] [place] [address] [phone]                 Diet and Activity recommendation:  As advised  Discharge Instructions    Discharge Instructions     Call MD for:  difficulty breathing, headache or visual disturbances   Complete by: As directed    Call MD for:  persistant dizziness or light-headedness   Complete by: As directed    Call MD for:  persistant nausea and vomiting   Complete by: As directed    Call MD for:  temperature >100.4   Complete by: As directed    Diet - low sodium heart healthy   Complete by: As directed    Discharge instructions   Complete by: As directed    1) You are strongly advised to isolate/quarantine for at least 10 days from the date of your diagnosis with COVID-19 infection--please always wear a mask if you have to go outside the house  2)Please take medications as prescribed--- please note that there were some changes to your medications  3)you need oxygen at home at 3 L via nasal cannula continuously while awake and while asleep--- smoking or having open fires around oxygen can cause fire, significant injury and death  4)Repeat CBC and CMP -blood test with your primary care physician in about 10 days  5) please continue CPAP at bedtime   Increase activity slowly   Complete by: As directed         Discharge Medications     Allergies as of 10/14/2022       Reactions   Contrast Media [iodinated Contrast Media] Other (See Comments)   Pt must be premedicated before given contrast media - stops heart   Iohexol Other (See Comments)    Consult with radiologist before pre meds are given.Desc: PT. STATES HEART STOPPED HAS TO BE PREMED.        Medication List      STOP taking these medications    polyethylene glycol-electrolytes 420 g solution Commonly known as: TriLyte       TAKE these medications    albuterol 108 (90 Base) MCG/ACT inhaler Commonly known as: VENTOLIN HFA INHALE TWO PUFFS BY MOUTH INTO LUNGS EVERY 6 HOURS AS NEEDED FOR SHORTNESS OF BREATH OR FOR WHEEZING   albuterol (2.5 MG/3ML) 0.083% nebulizer solution Commonly known as: PROVENTIL Take 3 mLs (2.5 mg total) by nebulization every 6 (six) hours as needed for wheezing or shortness of breath.   apixaban 5 MG Tabs tablet Commonly known as: Eliquis Take 1 tablet (5 mg total) by mouth 2 (two) times daily.   ascorbic acid 500 MG tablet Commonly known as: VITAMIN C Take 1 tablet (500 mg total) by mouth daily. Start taking on: October 15, 2022   B-D ULTRAFINE III SHORT PEN 31G X 8 MM Misc Generic drug: Insulin Pen Needle USE AS DIRECTED TWICE DAILY   blood glucose meter kit and supplies Dispense based on patient and insurance preference. Use up to two times daily as directed. (FOR ICD-E11.65)   diazepam 5 MG tablet Commonly known as: VALIUM Take 5 mg by mouth 2 (two) times daily.   escitalopram 20 MG tablet Commonly known as: LEXAPRO Take 20 mg by mouth in the morning.   fenofibrate 145 MG tablet Commonly known as: TRICOR Take 145 mg by mouth in the morning.   FreeStyle Libre 2 Reader Kerrin Mo As directed   [employer] 2 Sensor Misc 1 Piece by Does not apply route every 14 (fourteen) days.   furosemide 80 MG tablet Commonly known as: LASIX Take 80 mg by mouth in the morning.   gabapentin 300 MG capsule  Commonly known as: NEURONTIN Take 300 mg by mouth 3 (three) times daily.   HumuLIN R U-500 KwikPen 500 UNIT/ML KwikPen Generic drug: insulin regular human CONCENTRATED Inject 80 Units into the skin 3 (three) times daily with meals. Only when blood glucose is above 90 and eating.   isosorbide mononitrate 120 MG 24 hr tablet Commonly known as:  IMDUR Take 120 mg by mouth in the morning.   lisinopril 10 MG tablet Commonly known as: ZESTRIL Take 1 tablet (10 mg total) by mouth daily.   meclizine 25 MG tablet Commonly known as: ANTIVERT Take 25 mg by mouth every 6 (six) hours as needed for dizziness or nausea.   metoprolol tartrate 100 MG tablet Commonly known as: LOPRESSOR Take 1 tablet (100 mg total) by mouth 2 (two) times daily. OFFICE VISIT NEEDED BEFORE ADDITIONAL REFILLS   modafinil 200 MG tablet Commonly known as: PROVIGIL Take 200 mg by mouth 2 (two) times daily.   nirmatrelvir/ritonavir EUA 20 x 150 MG & 10 x 100MG Tabs Commonly known as: PAXLOVID Take 3 tablets by mouth 2 (two) times daily for 5 days. Take nirmatrelvir (150 mg) two tablets twice daily for 5 days and ritonavir (100 mg) one tablet twice daily for 5 days.   nitroGLYCERIN 0.4 MG/SPRAY spray Commonly known as: Nitrolingual Place 1 spray under the tongue every 5 (five) minutes x 3 doses as needed for chest pain.   OneTouch Delica Plus [id number] Misc USE TO check blood sugar FOUR TIMES DAILY AS DIRECTED   OneTouch Verio test strip Generic drug: glucose blood USE TO check blood sugar FOUR TIMES DAILY AS DIRECTED   OneTouch Verio w/Device Kit 1 each by Does not apply route as needed.   pantoprazole 40 MG tablet Commonly known as: PROTONIX TAKE ONE TABLET BY MOUTH TWICE DAILY BEFORE A meal   potassium chloride SA 20 MEQ tablet Commonly known as: KLOR-CON M Take 1 tablet (20 mEq total) by mouth daily. While Taking Lasix/Furosemide What changed:  when to take this additional instructions   predniSONE 20 MG tablet Commonly known as: DELTASONE Take 2 tablets (40 mg total) by mouth daily with breakfast for 5 days. What changed:  medication strength how much to take how to take this when to take this additional instructions   rosuvastatin 20 MG tablet Commonly known as: CRESTOR Take 20 mg by mouth in the morning.   senna-docusate 8.6-50  MG tablet Commonly known as: Senokot-S Take 2 tablets by mouth at bedtime.   Trelegy Ellipta 100-62.5-25 MCG/ACT Aepb Generic drug: Fluticasone-Umeclidin-Vilant Inhale 1 puff into the lungs daily.   VITAMIN B-12 IJ Inject 1,000 mcg as directed every 30 (thirty) days.   Vitamin D (Ergocalciferol) 1.25 MG (50000 UNIT) Caps capsule Commonly known as: DRISDOL Take 1 capsule (50,000 Units total) by mouth every 7 (seven) days. What changed: when to take this   zinc sulfate 220 (50 Zn) MG capsule Take 1 capsule (220 mg total) by mouth daily. Start taking on: October 15, 2022        Major procedures and Radiology Reports - PLEASE review detailed and final reports for all details, in brief -   DG Chest Portable 1 View  Result Date: 10/12/2022 CLINICAL DATA:  Chest pain EXAM: PORTABLE CHEST 1 VIEW COMPARISON:  Previous studies including the examination of 05/14/2022 and CT done on 08/13/2022 FINDINGS: Transverse diameter of heart is slightly increased. There is poor inspiration. There is prominence of interstitial markings in parahilar regions and lower lung fields.  There is slight improvement in aeration in right parahilar region. There is no new focal pulmonary consolidation. There is blunting of both lateral CP angles. There is no pneumothorax. IMPRESSION: There is residual prominence of interstitial markings in parahilar regions and lower lung fields suggesting interstitial edema or interstitial pneumonia. Part of this finding may suggest underlying scarring. There is interval appearance of small bilateral pleural effusions, more so on the right side. Electronically Signed   By: Elmer Picker M.D.   On: 10/12/2022 10:41    Micro Results   Recent Results (from the past 240 hour(s))  Resp panel by RT-PCR (RSV, Flu A&B, Covid) Anterior Nasal Swab     Status: Abnormal   Collection Time: 10/12/22 11:11 AM   Specimen: Anterior Nasal Swab  Result Value Ref Range Status   SARS  Coronavirus 2 by RT PCR POSITIVE (A) NEGATIVE Final    Comment: (NOTE) SARS-CoV-2 target nucleic acids are DETECTED.  The SARS-CoV-2 RNA is generally detectable in upper respiratory specimens during the acute phase of infection. Positive results are indicative of the presence of the identified virus, but do not rule out bacterial infection or co-infection with other pathogens not detected by the test. Clinical correlation with patient history and other diagnostic information is necessary to determine patient infection status. The expected result is Negative.  Fact Sheet for Patients: EntrepreneurPulse.com.au  Fact Sheet for Healthcare Providers: IncredibleEmployment.be  This test is not yet approved or cleared by the [place] FDA and  has been authorized for detection and/or diagnosis of SARS-CoV-2 by FDA under an Emergency Use Authorization (EUA).  This EUA will remain in effect (meaning this test can be used) for the duration of  the COVID-19 declaration under Section 564(b)(1) of the A ct, 21 U.S.C. section 360bbb-3(b)(1), unless the authorization is terminated or revoked sooner.     Influenza A by PCR NEGATIVE NEGATIVE Final   Influenza B by PCR NEGATIVE NEGATIVE Final    Comment: (NOTE) The Xpert Xpress SARS-CoV-2/FLU/RSV plus assay is intended as an aid in the diagnosis of influenza from Nasopharyngeal swab specimens and should not be used as a sole basis for treatment. Nasal washings and aspirates are unacceptable for Xpert Xpress SARS-CoV-2/FLU/RSV testing.  Fact Sheet for Patients: EntrepreneurPulse.com.au  Fact Sheet for Healthcare Providers: IncredibleEmployment.be  This test is not yet approved or cleared by the [place] FDA and has been authorized for detection and/or diagnosis of SARS-CoV-2 by FDA under an Emergency Use Authorization (EUA). This EUA will remain in effect (meaning  this test can be used) for the duration of the COVID-19 declaration under Section 564(b)(1) of the Act, 21 U.S.C. section 360bbb-3(b)(1), unless the authorization is terminated or revoked.     Resp Syncytial Virus by PCR NEGATIVE NEGATIVE Final    Comment: (NOTE) Fact Sheet for Patients: EntrepreneurPulse.com.au  Fact Sheet for Healthcare Providers: IncredibleEmployment.be  This test is not yet approved or cleared by the [place] FDA and has been authorized for detection and/or diagnosis of SARS-CoV-2 by FDA under an Emergency Use Authorization (EUA). This EUA will remain in effect (meaning this test can be used) for the duration of the COVID-19 declaration under Section 564(b)(1) of the Act, 21 U.S.C. section 360bbb-3(b)(1), unless the authorization is terminated or revoked.  Performed at [facility], [address] [place], [place] [address]    Today   Subjective    [name] today has no new complaints No fever  Or chills   No Nausea, Vomiting or  Diarrhea         Patient has been seen and examined prior to discharge   Objective   Blood pressure 112/71, pulse 74, temperature (!) 96.8 F (36 C), resp. rate 14, height _0  (1.778 m), weight (!) 137 kg, SpO2 95 %.   Intake/Output Summary (Last 24 hours) at 10/14/2022 1547 Last data filed at 10/14/2022 1300 Gross per 24 hour  Intake 960 ml  Output --  Net 960 ml    Exam Gen:- Awake Alert, no acute distress , morbidly obese, no conversational dyspnea HEENT:- [place].AT, No sclera icterus Nose- [place] 3L/min Neck-Supple Neck,No JVD,.  Lungs- -improved air movement, no wheezing CV- S1, S2 normal, irregular Abd-  +ve B.Sounds, Abd Soft, No tenderness, increased truncal adiposity Extremity/Skin:- No  edema,   good pulses Psych-affect is appropriate, oriented x3 Neuro-no new focal deficits, no tremors    Data Review   CBC w Diff:  Lab Results  Component Value Date    WBC 15.2 (H) 10/14/2022   HGB 16.6 10/14/2022   HCT 49.3 10/14/2022   PLT 313 10/14/2022   LYMPHOPCT 21 06/02/2015   MONOPCT 8 06/02/2015   EOSPCT 5 06/02/2015   BASOPCT 1 06/02/2015    CMP:  Lab Results  Component Value Date   NA 140 10/14/2022   NA 142 04/26/2022   K 3.2 (L) 10/14/2022   CL 102 10/14/2022   CO2 26 10/14/2022   BUN 21 (H) 10/14/2022   BUN 11 04/26/2022   CREATININE 1.15 10/14/2022   GLU 206 12/04/2020   PROT 7.5 10/14/2022   PROT 6.6 04/26/2022   ALBUMIN 3.7 10/14/2022   ALBUMIN 4.1 04/26/2022   BILITOT 0.7 10/14/2022   BILITOT 0.5 04/26/2022   ALKPHOS 49 10/14/2022   AST 45 (H) 10/14/2022   ALT 28 10/14/2022  .  Total Discharge time is about 33 minutes  Roxan Hockey M.D on 10/14/2022 at 3:47 PM  Go to [email or web address] -  for contact info  [facility] - Office  [phone]

## 2022-10-14 NOTE — Care Management Important Message (Signed)
Important Message  Patient Details  Name: [name] MRN: [id number] Date of Birth: 01/24/1961   Medicare Important Message Given:  N/A - LOS <3 / Initial given by admissions     Tommy Medal 10/14/2022, 11:07 AM

## 2022-10-18 DIAGNOSIS — E1165 Type 2 diabetes mellitus with hyperglycemia: Secondary | ICD-10-CM | POA: Diagnosis not present

## 2022-10-18 NOTE — Progress Notes (Signed)
  Care Coordination Note  10/18/2022 Name: YEHOSHUA VITELLI MRN: [id number] DOB: [birth year]  ELLIOT MELDRUM is a 61 y.o. year old male who is a primary care patient of Sharilyn Sites, MD and is actively engaged with the care management team. I reached out to [name] by phone today to assist with re-scheduling a follow up visit with the RN Case Manager  Follow up plan: Unsuccessful telephone outreach attempt made. A HIPAA compliant phone message was left for the patient providing contact information and requesting a return call.   [place]  Direct Dial: [phone]

## 2022-10-22 DIAGNOSIS — J441 Chronic obstructive pulmonary disease with (acute) exacerbation: Secondary | ICD-10-CM | POA: Diagnosis not present

## 2022-10-22 DIAGNOSIS — F320 Major depressive disorder, single episode, mild: Secondary | ICD-10-CM | POA: Diagnosis not present

## 2022-10-22 DIAGNOSIS — R69 Illness, unspecified: Secondary | ICD-10-CM | POA: Diagnosis not present

## 2022-10-22 DIAGNOSIS — U099 Post covid-19 condition, unspecified: Secondary | ICD-10-CM | POA: Diagnosis not present

## 2022-10-22 DIAGNOSIS — E119 Type 2 diabetes mellitus without complications: Secondary | ICD-10-CM | POA: Diagnosis not present

## 2022-10-22 DIAGNOSIS — Z6841 Body Mass Index (BMI) 40.0 and over, adult: Secondary | ICD-10-CM | POA: Diagnosis not present

## 2022-10-22 DIAGNOSIS — J069 Acute upper respiratory infection, unspecified: Secondary | ICD-10-CM | POA: Diagnosis not present

## 2022-10-22 DIAGNOSIS — R6889 Other general symptoms and signs: Secondary | ICD-10-CM | POA: Diagnosis not present

## 2022-10-22 DIAGNOSIS — Z20828 Contact with and (suspected) exposure to other viral communicable diseases: Secondary | ICD-10-CM | POA: Diagnosis not present

## 2022-10-23 DIAGNOSIS — J449 Chronic obstructive pulmonary disease, unspecified: Secondary | ICD-10-CM | POA: Diagnosis not present

## 2022-10-23 DIAGNOSIS — G4733 Obstructive sleep apnea (adult) (pediatric): Secondary | ICD-10-CM | POA: Diagnosis not present

## 2022-10-26 NOTE — Progress Notes (Signed)
  Care Coordination Note  10/26/2022 Name: [name] MRN: [id number] DOB: [birth year]  [name] is a 61 y.o. year old male who is a primary care patient of Sharilyn Sites, MD and is actively engaged with the care management team. I reached out to [name] by phone today to assist with re-scheduling a follow up visit with the RN Case Manager  Follow up plan: Telephone appointment with care management team member scheduled for:10/27/22  [place]: [phone]

## 2022-10-27 ENCOUNTER — Telehealth: Payer: Self-pay | Admitting: "Endocrinology

## 2022-10-27 ENCOUNTER — Encounter: Payer: Medicare HMO | Admitting: *Deleted

## 2022-10-27 ENCOUNTER — Telehealth: Payer: Self-pay | Admitting: *Deleted

## 2022-10-27 DIAGNOSIS — E1159 Type 2 diabetes mellitus with other circulatory complications: Secondary | ICD-10-CM

## 2022-10-27 MED ORDER — FREESTYLE LIBRE 2 SENSOR MISC: 1.0000 | 2 refills | Status: DC

## 2022-10-27 NOTE — Progress Notes (Signed)
  Care Coordination Note  10/27/2022 Name: JADARRIUS MASELLI MRN: [id number] DOB: 11-18-1960  [name] is a 61 y.o. year old male who is a primary care patient of Sharilyn Sites, MD and is actively engaged with the care management team. I reached out to [name] by phone today to assist with re-scheduling a follow up visit with the RN Case Manager  Follow up plan: Unsuccessful telephone outreach attempt made. A HIPAA compliant phone message was left for the patient providing contact information and requesting a return call.   [place]  Direct Dial: [phone]

## 2022-10-27 NOTE — Telephone Encounter (Signed)
Rx refill sent to [facility].

## 2022-10-27 NOTE — Telephone Encounter (Signed)
Pt is asking if you can order him some new libre 2 sensors. He said he is not sure where you normally order them from for him. Thank you

## 2022-10-27 NOTE — Progress Notes (Signed)
  Care Coordination Note  10/27/2022 Name: [name] MRN: [id number] DOB: [birth year]  [name] is a 61 y.o. year old male who is a primary care patient of Sharilyn Sites, MD and is actively engaged with the care management team. I reached out to [name] by phone today to assist with re-scheduling a follow up visit with the RN Case Manager  Follow up plan: Telephone appointment with care management team member scheduled for:10/29/22  [place]  Direct Dial: [phone]

## 2022-10-29 ENCOUNTER — Ambulatory Visit: Payer: Self-pay | Admitting: *Deleted

## 2022-10-29 NOTE — Patient Outreach (Signed)
  Care Coordination   10/29/2022 Name: [name] MRN: [id number] DOB: [birth year]   Care Coordination Outreach Attempts:  An unsuccessful telephone outreach was attempted for a scheduled appointment today. 2nd unsuccessful attempt.  Follow Up Plan:  Additional outreach attempts will be made to offer the patient care coordination information and services.   Encounter Outcome:  No Answer   Care Coordination Interventions:  No, not indicated    Chong Sicilian, BSN, RN-BC RN Care Coordinator [place]: [phone] Main #: [phone]

## 2022-10-31 DIAGNOSIS — J449 Chronic obstructive pulmonary disease, unspecified: Secondary | ICD-10-CM | POA: Diagnosis not present

## 2022-10-31 DIAGNOSIS — E1165 Type 2 diabetes mellitus with hyperglycemia: Secondary | ICD-10-CM | POA: Diagnosis not present

## 2022-10-31 DIAGNOSIS — I10 Essential (primary) hypertension: Secondary | ICD-10-CM | POA: Diagnosis not present

## 2022-11-03 DIAGNOSIS — E1159 Type 2 diabetes mellitus with other circulatory complications: Secondary | ICD-10-CM | POA: Diagnosis not present

## 2022-11-04 DIAGNOSIS — G2581 Restless legs syndrome: Secondary | ICD-10-CM | POA: Diagnosis not present

## 2022-11-04 DIAGNOSIS — J9611 Chronic respiratory failure with hypoxia: Secondary | ICD-10-CM | POA: Diagnosis not present

## 2022-11-04 DIAGNOSIS — J449 Chronic obstructive pulmonary disease, unspecified: Secondary | ICD-10-CM | POA: Diagnosis not present

## 2022-11-04 DIAGNOSIS — G4733 Obstructive sleep apnea (adult) (pediatric): Secondary | ICD-10-CM | POA: Diagnosis not present

## 2022-11-04 LAB — COMPREHENSIVE METABOLIC PANEL
ALT: 20 IU/L (ref 0–44)
AST: 14 IU/L (ref 0–40)
Albumin/Globulin Ratio: 1.8 (ref 1.2–2.2)
Albumin: 3.5 g/dL — ABNORMAL LOW (ref 3.8–4.9)
Alkaline Phosphatase: 58 IU/L (ref 44–121)
BUN/Creatinine Ratio: 8 — ABNORMAL LOW (ref 9–20)
BUN: 8 mg/dL (ref 6–24)
Bilirubin Total: 0.3 mg/dL (ref 0.0–1.2)
CO2: 25 mmol/L (ref 20–29)
Calcium: 9.3 mg/dL (ref 8.7–10.2)
Chloride: 102 mmol/L (ref 96–106)
Creatinine, Ser: 0.98 mg/dL (ref 0.76–1.27)
Globulin, Total: 1.9 g/dL (ref 1.5–4.5)
Glucose: 183 mg/dL — ABNORMAL HIGH (ref 70–99)
Potassium: 3.7 mmol/L (ref 3.5–5.2)
Sodium: 140 mmol/L (ref 134–144)
Total Protein: 5.4 g/dL — ABNORMAL LOW (ref 6.0–8.5)
eGFR: 89 mL/min/{1.73_m2} (ref >59)

## 2022-11-09 ENCOUNTER — Ambulatory Visit: Payer: Medicare HMO | Admitting: "Endocrinology

## 2022-11-09 ENCOUNTER — Ambulatory Visit: Payer: Self-pay | Admitting: *Deleted

## 2022-11-09 NOTE — Patient Outreach (Signed)
  Care Coordination   11/09/2022 Name: [name] MRN: [id number] DOB: June 15, 1961   Care Coordination Outreach Attempts:  An unsuccessful telephone outreach was attempted for a scheduled appointment today. 3rd unsuccessful telephone follow-up.   Follow Up Plan:  No further outreach attempts will be made at this time. We have been unable to contact the patient to offer or enroll patient in care coordination services  Encounter Outcome:  No Answer. Left HIPAA compliant VM that patient can return my call if he has any care coordination or resource needs.   Care Coordination Interventions:  No, not indicated    Chong Sicilian, BSN, RN-BC RN Care Coordinator [place] Direct Dial: [phone] Main #: [phone]

## 2022-11-11 ENCOUNTER — Ambulatory Visit: Payer: Medicare HMO | Admitting: "Endocrinology

## 2022-11-11 ENCOUNTER — Encounter: Payer: Self-pay | Admitting: "Endocrinology

## 2022-11-11 VITALS — BP 122/66 | HR 92 | Ht 66.0 in | Wt 305.4 lb

## 2022-11-11 DIAGNOSIS — I10 Essential (primary) hypertension: Secondary | ICD-10-CM

## 2022-11-11 DIAGNOSIS — E1159 Type 2 diabetes mellitus with other circulatory complications: Secondary | ICD-10-CM

## 2022-11-11 DIAGNOSIS — E782 Mixed hyperlipidemia: Secondary | ICD-10-CM | POA: Diagnosis not present

## 2022-11-11 DIAGNOSIS — F17200 Nicotine dependence, unspecified, uncomplicated: Secondary | ICD-10-CM

## 2022-11-11 DIAGNOSIS — R69 Illness, unspecified: Secondary | ICD-10-CM | POA: Diagnosis not present

## 2022-11-11 MED ORDER — HUMULIN R U-500 KWIKPEN 500 UNIT/ML ~~LOC~~ SOPN: 60.0000 [IU] | PEN_INJECTOR | Freq: Three times a day (TID) | SUBCUTANEOUS | 2 refills | Status: DC

## 2022-11-11 NOTE — Progress Notes (Signed)
11/11/2022  Endocrinology follow-up note   Subjective:    Patient ID: [name], male    DOB: September 15, 1961.  he is being seen in follow-up  for management of currently uncontrolled symptomatic type 2 diabetes, hypertension, hyperlipidemia, obesity. PMD:  Sharilyn Sites, MD.   Past Medical History:  Diagnosis Date   Abnormal myocardial perfusion study 01/01/2011   there a small to moderate sized inferobasal scar   Barrett's esophagus    Cataracts, bilateral    Chronic kidney disease    hx of kidney stones   Claudication ([place]) 11/16/2011   PV test perform shows normal   COPD (chronic obstructive pulmonary disease) ([place])    Depression    Diabetes ([place])    type 2 diabetes mellitus   Dysrhythmia    GERD (gastroesophageal reflux disease)    Glaucoma    Hernia of abdominal wall    History of radiation therapy    Right lung, SBRT- 04/16/21-04/30/21- Dr. Gery Pray   HTN (hypertension)    Hyperlipidemia    Morbid obesity ([place])    Myocardial infarction ([place]) 2008,2009,2009   OSA (obstructive sleep apnea)    on cpap   PAF (paroxysmal atrial fibrillation) ([place])    S/P colonoscopy 2009   3-4 mm transverse colon erosions likely secondary to  ASA   S/P endoscopy 10/2010   moderate erosive gastritis, Barrett's esophagus 1-2cm   Sleep apnea    SOB (shortness of breath) 11/03/2007   2D Echo EF 50%-55%   Stroke ([place]) 2017   Past Surgical History:  Procedure Laterality Date   BIOPSY N/A 04/29/2015   Procedure: BIOPSY;  Surgeon: Danie Binder, MD;  Location: [facility];  Service: Endoscopy;  Laterality: N/A;   BIOPSY  08/01/2018   Procedure: BIOPSY;  Surgeon: Danie Binder, MD;  Location: [facility];  Service: Endoscopy;;  esophageal   BIOPSY  10/12/2021   Procedure: BIOPSY;  Surgeon: Eloise Harman, DO;  [facility];  Service: Endoscopy;;   CABG X 4  03/2008   CARDIAC CATHETERIZATION  2009   stent placement to the left circumflex a 2.25    CARDIAC  CATHETERIZATION  07/08/2010   CARDIAC CATHETERIZATION N/A 11/02/2019   COLONOSCOPY WITH PROPOFOL N/A 09/27/2017   normal ileum, twenty 4 to 8 mm polyps in the sigmoid colon, descending colon, splenic flexure, transverse colon, ascending colon, cecum.  An additional three 2 to 4 mm polyps in the rectum and the descending colon.  Diverticulosis, internal hemorrhoids.  Surgical pathology found the polyps to be one fragment of hyperplastic polyp and 22 fragments of tubular adenoma..  Recommended repeat colonoscop   COLONOSCOPY WITH PROPOFOL N/A 10/12/2021   Procedure: COLONOSCOPY WITH PROPOFOL;  Surgeon: Eloise Harman, DO;  [facility];  Service: Endoscopy;  Laterality: N/A;  1:30pm   CORONARY ARTERY BYPASS GRAFT  2008   4 vessels   CORONARY STENT PLACEMENT  12/29/12   CORONARY STENT PLACEMENT  12/2012   ESOPHAGEAL DILATION N/A 04/29/2015   Procedure: ESOPHAGEAL DILATION 15 mm, 16 mm;  Surgeon: Danie Binder, MD;  Location: [facility];  Service: Endoscopy;  Laterality: N/A;   ESOPHAGOGASTRODUODENOSCOPY  09/2011   Barrett's esophagus, no dysplasia on biopsies. Distal esophagitis. Status post dilation. Moderate gastritis and duodenitis, but biopsies benign. Next EGD in November 2015 for surveillance of Barrett's esophagus.   ESOPHAGOGASTRODUODENOSCOPY (EGD) WITH PROPOFOL N/A 04/29/2015   SLF: 1. Barretts esophagus 2. Moderate non-erosive gastritis.    ESOPHAGOGASTRODUODENOSCOPY (  EGD) WITH PROPOFOL N/A 08/01/2018   Barrett's, repeat in 5 years. Empiric dilatation, mild gastritis   HERNIA REPAIR     ventral hernia repair   LEFT HEART CATHETERIZATION WITH CORONARY/GRAFT ANGIOGRAM N/A 12/29/2012   Procedure: LEFT HEART CATHETERIZATION WITH Beatrix Fetters;  Surgeon: Troy Sine, MD;  Location: [facility] CATH LAB;  Service: Cardiovascular;  Laterality: N/A;   ORIF FIBULA FRACTURE Right 06/03/2015   Procedure: OPEN REDUCTION INTERNAL FIXATION (ORIF) DISTAL FIBULA  FRACTURE;  Surgeon: Garald Balding, MD;  Location: [facility];  Service: Orthopedics;  Laterality: Right;   PERCUTANEOUS CORONARY STENT INTERVENTION (PCI-S)  12/29/2012   Procedure: PERCUTANEOUS CORONARY STENT INTERVENTION (PCI-S);  Surgeon: Troy Sine, MD;  Location: [facility] CATH LAB;  Service: Cardiovascular;;   POLYPECTOMY  09/27/2017   Procedure: POLYPECTOMY;  Surgeon: Danie Binder, MD;  [facility];  Service: Endoscopy;;  cecal polyp, ascending polyps x6, transverse colon polyps x6, splenic flexure polyps x2, descending colon polyps x6, sigmoid  colon polyp x1, rectal polyp x1    POLYPECTOMY  10/12/2021   Procedure: POLYPECTOMY;  Surgeon: Eloise Harman, DO;  [facility];  Service: Endoscopy;;   SAVORY DILATION  09/06/2011   Procedure: SAVORY DILATION;  Surgeon: Dorothyann Peng, MD;  Location: [facility];  Service: Endoscopy;  Laterality: N/A;  Dilated with 47mm   SAVORY DILATION N/A 08/01/2018   Procedure: SAVORY DILATION;  Surgeon: Danie Binder, MD;  [facility];  Service: Endoscopy;  Laterality: N/A;   Social History   Socioeconomic History   Marital status: Divorced    Spouse name: Not on file   Number of children: Not on file   Years of education: Not on file   Highest education level: Not on file  Occupational History   Not on file  Tobacco Use   Smoking status: Every Day    Packs/day: 0.50    Years: 30.00    Total pack years: 15.00    Types: Cigarettes   Smokeless tobacco: Never   Tobacco comments:    smokes 1/2 pack per day 08/31/22.  Vaping Use   Vaping Use: Never used  Substance and Sexual Activity   Alcohol use: Yes    Comment: occ beer   Drug use: No   Sexual activity: Never  Other Topics Concern   Not on file  Social History Narrative   Not on file   Social Determinants of Health   Financial Resource Strain: High Risk (08/23/2022)   Overall Financial Resource Strain (CARDIA)    Difficulty of Paying Living Expenses: Very hard  Food Insecurity:  No Food Insecurity (10/13/2022)   Hunger Vital Sign    Worried About Running Out of Food in the Last Year: Never true    Ran Out of Food in the Last Year: Never true  Recent Concern: Food Insecurity - Food Insecurity Present (08/23/2022)   Hunger Vital Sign    Worried About Running Out of Food in the Last Year: Often true    Ran Out of Food in the Last Year: Often true  Transportation Needs: No Transportation Needs (10/13/2022)   PRAPARE - Hydrologist (Medical): No    Lack of Transportation (Non-Medical): No  Physical Activity: Not on file  Stress: Not on file  Social Connections: Not on file   Outpatient Encounter Medications as of 11/11/2022  Medication Sig   albuterol (PROVENTIL) (2.5 MG/3ML) 0.083% nebulizer solution Take 3 mLs (  2.5 mg total) by nebulization every 6 (six) hours as needed for wheezing or shortness of breath.   albuterol (VENTOLIN HFA) 108 (90 Base) MCG/ACT inhaler INHALE TWO PUFFS BY MOUTH INTO LUNGS EVERY 6 HOURS AS NEEDED FOR SHORTNESS OF BREATH OR FOR WHEEZING   apixaban (ELIQUIS) 5 MG TABS tablet Take 1 tablet (5 mg total) by mouth 2 (two) times daily.   ascorbic acid (VITAMIN C) 500 MG tablet Take 1 tablet (500 mg total) by mouth daily.   B-D ULTRAFINE III SHORT PEN 31G X 8 MM MISC USE AS DIRECTED TWICE DAILY   blood glucose meter kit and supplies Dispense based on patient and insurance preference. Use up to two times daily as directed. (FOR ICD-E11.65)   Blood Glucose Monitoring Suppl (ONETOUCH VERIO) w/Device KIT 1 each by Does not apply route as needed.   Continuous Blood Gluc Receiver (FREESTYLE LIBRE 2 READER) DEVI As directed   Continuous Blood Gluc Sensor (FREESTYLE LIBRE 2 SENSOR) MISC 1 Piece by Does not apply route every 14 (fourteen) days.   Cyanocobalamin (VITAMIN B-12 IJ) Inject 1,000 mcg as directed every 30 (thirty) days.   diazepam (VALIUM) 5 MG tablet Take 5 mg by mouth 2 (two) times daily.   escitalopram (LEXAPRO) 20  MG tablet Take 20 mg by mouth in the morning.   fenofibrate (TRICOR) 145 MG tablet Take 145 mg by mouth in the morning.   Fluticasone-Umeclidin-Vilant (TRELEGY ELLIPTA) 100-62.5-25 MCG/ACT AEPB Inhale 1 puff into the lungs daily.   furosemide (LASIX) 80 MG tablet Take 80 mg by mouth in the morning.   gabapentin (NEURONTIN) 300 MG capsule Take 300 mg by mouth 3 (three) times daily.   insulin regular human CONCENTRATED (HUMULIN R U-500 KWIKPEN) 500 UNIT/ML KwikPen Inject 60 Units into the skin 3 (three) times daily with meals. Only when blood glucose is above 90 and eating.   isosorbide mononitrate (IMDUR) 120 MG 24 hr tablet Take 120 mg by mouth in the morning.   Lancets (ONETOUCH DELICA PLUS [id number]) MISC USE TO check blood sugar FOUR TIMES DAILY AS DIRECTED   lisinopril (ZESTRIL) 10 MG tablet Take 1 tablet (10 mg total) by mouth daily.   meclizine (ANTIVERT) 25 MG tablet Take 25 mg by mouth every 6 (six) hours as needed for dizziness or nausea.   metoprolol tartrate (LOPRESSOR) 100 MG tablet Take 1 tablet (100 mg total) by mouth 2 (two) times daily. OFFICE VISIT NEEDED BEFORE ADDITIONAL REFILLS   modafinil (PROVIGIL) 200 MG tablet Take 200 mg by mouth 2 (two) times daily.   nitroGLYCERIN (NITROLINGUAL) 0.4 MG/SPRAY spray Place 1 spray under the tongue every 5 (five) minutes x 3 doses as needed for chest pain.   ONETOUCH VERIO test strip USE TO check blood sugar FOUR TIMES DAILY AS DIRECTED   pantoprazole (PROTONIX) 40 MG tablet TAKE ONE TABLET BY MOUTH TWICE DAILY BEFORE A meal   potassium chloride SA (KLOR-CON M) 20 MEQ tablet Take 1 tablet (20 mEq total) by mouth daily. While Taking Lasix/Furosemide   rosuvastatin (CRESTOR) 20 MG tablet Take 20 mg by mouth in the morning.   senna-docusate (SENOKOT-S) 8.6-50 MG tablet Take 2 tablets by mouth at bedtime.   Vitamin D, Ergocalciferol, (DRISDOL) 1.25 MG (50000 UNIT) CAPS capsule Take 1 capsule (50,000 Units total) by mouth every 7 (seven) days.  (Patient taking differently: Take 50,000 Units by mouth every Friday.)   zinc sulfate 220 (50 Zn) MG capsule Take 1 capsule (220 mg total) by mouth daily.   [  DISCONTINUED] insulin regular human CONCENTRATED (HUMULIN R U-500 KWIKPEN) 500 UNIT/ML KwikPen Inject 80 Units into the skin 3 (three) times daily with meals. Only when blood glucose is above 90 and eating.   No facility-administered encounter medications on file as of 11/11/2022.    ALLERGIES: Allergies  Allergen Reactions   Contrast Media [Iodinated Contrast Media] Other (See Comments)    Pt must be premedicated before given contrast media - stops heart   Iohexol Other (See Comments)     Consult with radiologist before pre meds are given.Desc: PT. STATES HEART STOPPED HAS TO BE PREMED.     VACCINATION STATUS: Immunization History  Administered Date(s) Administered   Influenza,inj,Quad PF,6+ Mos 09/22/2020, 07/31/2022    Diabetes He presents for his follow-up diabetic visit. He has type 2 diabetes mellitus. Onset time: He was diagnosed at approximate age of 74 years. His disease course has been improving. There are no hypoglycemic associated symptoms. Pertinent negatives for hypoglycemia include no confusion, headaches, pallor or seizures. Pertinent negatives for diabetes include no blurred vision, no chest pain, no fatigue, no polydipsia, no polyphagia, no polyuria and no weakness. There are no hypoglycemic complications. Symptoms are improving. Diabetic complications include a CVA and heart disease. Risk factors for coronary artery disease include diabetes mellitus, dyslipidemia, hypertension, male sex, obesity, tobacco exposure, sedentary lifestyle and family history. Current diabetic treatments: He was supposed to be on basal insulin, unfortunately he ran out and could not afford refills and he did not call clinic for alternatives. He is compliant with treatment none of the time. His weight is fluctuating minimally. He is following a  generally unhealthy diet. When asked about meal planning, he reported none. He has not had a previous visit with a dietitian. He never participates in exercise. His home blood glucose trend is decreasing steadily. His breakfast blood glucose range is generally 140-180 mg/dl. His lunch blood glucose range is generally 140-180 mg/dl. His dinner blood glucose range is generally 140-180 mg/dl. His bedtime blood glucose range is generally 140-180 mg/dl. His overall blood glucose range is 140-180 mg/dl. [place] presents with his CGM which shows significantly proved glycemic profile averaging 150 for the last 14 days.  He has 68% time in range, 19% level 1 hyperglycemia, 6% level 2 hyperglycemia.  He has 6% tightening of blood glucose between 54 and 69.   His recent labs show A1c of 7.5% improving from 8.9%.    ) An ACE inhibitor/angiotensin II receptor blocker is being taken. He does not see a podiatrist.Eye exam is not current.  Hyperlipidemia This is a chronic problem. The current episode started more than 1 year ago. The problem is uncontrolled. Recent lipid tests were reviewed and are high. Exacerbating diseases include diabetes and obesity. Pertinent negatives include no chest pain, myalgias or shortness of breath. Current antihyperlipidemic treatment includes statins. Compliance problems include medication cost and psychosocial issues.  Risk factors for coronary artery disease include dyslipidemia, diabetes mellitus, hypertension, male sex, obesity, family history and a sedentary lifestyle.  Hypertension This is a chronic problem. The current episode started more than 1 year ago. The problem is uncontrolled. Pertinent negatives include no blurred vision, chest pain, headaches, neck pain, palpitations or shortness of breath. Risk factors for coronary artery disease include diabetes mellitus, dyslipidemia, male gender, obesity, family history, sedentary lifestyle and smoking/tobacco exposure. Past treatments  include ACE inhibitors. Hypertensive end-organ damage includes CVA.     Review of systems  Constitutional: + Minimally fluctuating body weight,  current  Body  mass index is 49.29 kg/m. , no fatigue, no subjective hyperthermia, no subjective hypothermia    Objective:    BP 122/66   Pulse 92   Ht 5\' 6"  (1.676 m)   Wt (!) 305 lb 6.4 oz (138.5 kg)   BMI 49.29 kg/m   Wt Readings from Last 3 Encounters:  11/11/22 (!) 305 lb 6.4 oz (138.5 kg)  10/14/22 (!) 302 lb 0.5 oz (137 kg)  08/31/22 (!) 316 lb 9.6 oz (143.6 kg)      Physical Exam- Limited  Constitutional:  Body mass index is 49.29 kg/m. , not in acute distress, normal state of mind    CMP ( most recent) CMP     Component Value Date/Time   NA 140 11/03/2022 0951   K 3.7 11/03/2022 0951   CL 102 11/03/2022 0951   CO2 25 11/03/2022 0951   GLUCOSE 183 (H) 11/03/2022 0951   GLUCOSE 98 10/14/2022 0415   BUN 8 11/03/2022 0951   CREATININE 0.98 11/03/2022 0951   CALCIUM 9.3 11/03/2022 0951   PROT 5.4 (L) 11/03/2022 0951   ALBUMIN 3.5 (L) 11/03/2022 0951   AST 14 11/03/2022 0951   ALT 20 11/03/2022 0951   ALKPHOS 58 11/03/2022 0951   BILITOT 0.3 11/03/2022 0951   GFRNONAA >60 10/14/2022 0415   GFRAA 93 12/04/2020 0000   Diabetic Labs (most recent): Lab Results  Component Value Date   HGBA1C 7.5 (H) 10/12/2022   HGBA1C 8.9 (A) 08/09/2022   HGBA1C 5.6 05/06/2022   MICROALBUR 10 06/26/2020     Lipid Panel ( most recent) Lipid Panel     Component Value Date/Time   CHOL 122 04/26/2022 1353   TRIG 206 (H) 04/26/2022 1353   HDL 26 (L) 04/26/2022 1353   CHOLHDL 4.7 04/26/2022 1353   CHOLHDL 6.1 04/28/2014 0121   VLDL 61 (H) 04/28/2014 0121   LDLCALC 62 04/26/2022 1353     Assessment & Plan:   1. DM type 2 causing vascular disease ([place])  - Patient has currently uncontrolled symptomatic type 2 DM since  62 years of age.  [place] presents with his CGM which shows significantly proved glycemic profile  averaging 150 for the last 14 days.  He has 68% time in range, 19% level 1 hyperglycemia, 6% level 2 hyperglycemia.  He has 6% tightening of blood glucose between 54 and 69.   His recent labs show A1c of 7.5% improving from 8.9%.  -his diabetes is complicated by coronary artery disease, CVA, obesity/sedentary life, chronic heavy smoking and JACORI MULROONEY remains at extremely high risk  for more acute and chronic complications which include CAD, CVA, CKD, retinopathy, and neuropathy. These are all discussed in detail with the patient.  - I have counseled him on diet management and weight loss, by adopting a carbohydrate restricted/protein rich diet.   - he acknowledges that there is a room for improvement in his food and drink choices. - Suggestion is made for him to avoid simple carbohydrates  from his diet including Cakes, Sweet Desserts, Ice Cream, Soda (diet and regular), Sweet Tea, Candies, Chips, Cookies, Store Bought Juices, Alcohol in Excess of  1-2 drinks a day, Artificial Sweeteners,  Coffee Creamer, and "Sugar-free" Products, Lemonade. This will help patient to have more stable blood glucose profile and potentially avoid unintended weight gain. - I encouraged him to switch to  unprocessed or minimally processed complex starch and increased protein intake (animal or plant source), fruits, and vegetables. Whole food, plant-based diet  was discussed in detail with him.  - he is advised to stick to a routine mealtimes to eat 3 meals  a day and avoid unnecessary snacks ( to snack only to correct hypoglycemia).   - I have approached him with the following individualized plan to manage diabetes and patient agrees:   -He has social problems, not enough support.  Reportedly sleeps extended hours, could be missing some meals.  He is at risk of hypoglycemia.   He has no interest of safety, I advised him to lower his insulin Humulin RU 500 to 60 units   3 times a day for [place] blood glucose  readings above 90 mg per DL,  associated with strict monitoring of blood glucose 4 times a day-before meals and at bedtime, using his CGM continuously. -He is advised to use his meter as a backup when he has problem with his CGM.  - He does not tolerate metformin due to GI side effects.  -He has benefited from low-dose Trulicity.  He is advised to continue Trulicity 1.5 mg subcutaneously weekly.    -Patient is encouraged to call clinic for blood glucose levels less than 70 or above 200 mg /dl.   - Patient specific target  A1c;  LDL, HDL, Triglycerides,  were discussed in detail.  2) BP/HTN:  -His blood pressure is controlled to target.  He remains on his lisinopril 10 mg p.o. daily, metoprolol 100 mg p.o. twice daily, and advised to continue. The patient was counseled on the dangers of tobacco use, and was advised to quit.  Reviewed strategies to maximize success, including removing cigarettes and smoking materials from environment.   3) Lipids/HPL: He has had history of severe hypertriglyceridemia, recently improving to 230 from 469, mainly associated with improvement in his glycemic profile.  LDL is controlled at 61.  He is advised to continue Trilipix 135 mg p.o. nightly, continue Crestor 20 mg p.o. nightly,  fenofibrate 145 mg p.o. nightly.       4)  Weight/Diet: His BMI is 16.10--RUEAVWU complicating his diabetes care.  He is a candidate for modest weight loss.  CDE Consult has been  initiated , exercise, and detailed carbohydrates information provided.  5) vitamin D deficiency: He is on ongoing supplement with vitamin D2 50,000 units weekly for 12 weeks.  6) Chronic Care/Health Maintenance:  -he  is on ACEI/ARB and Statin medications and  is encouraged to continue to follow up with Ophthalmology, Dentist,  Podiatrist at least yearly or according to recommendations, and advised to  quit smoking. I have recommended yearly flu vaccine and pneumonia vaccination at least every 5 years;  moderate intensity exercise for up to 150 minutes weekly; and  sleep for at least 7 hours a day.   - I advised patient to maintain close follow up with Sharilyn Sites, MD for primary care needs.  I spent 26 minutes in the care of the patient today including review of labs from [facility], Lipids, Thyroid Function, Hematology (current and previous including abstractions from other facilities); face-to-face time discussing  his blood glucose readings/logs, discussing hypoglycemia and hyperglycemia episodes and symptoms, medications doses, his options of short and long term treatment based on the latest standards of care / guidelines;  discussion about incorporating lifestyle medicine;  and documenting the encounter. Risk reduction counseling performed per USPSTF guidelines to reduce  obesity and cardiovascular risk factors.     Please refer to Patient Instructions for Blood Glucose Monitoring and Insulin/Medications Dosing Guide"  in media tab  for additional information. Please  also refer to " Patient Self Inventory" in the Media  tab for reviewed elements of pertinent patient history.  [name] participated in the discussions, expressed understanding, and voiced agreement with the above plans.  All questions were answered to his satisfaction. he is encouraged to contact clinic should he have any questions or concerns prior to his return visit.   Follow up plan: - Return in about 3 months (around 02/10/2023) for Bring Meter/CGM Device/Logs- A1c in Office.  Glade Lloyd, MD Phone: [phone]  Fax: [phone]   11/11/2022, 1:56 PM   This note was partially dictated with voice recognition software. Similar sounding words can be transcribed inadequately or may not  be corrected upon review.

## 2022-11-11 NOTE — Patient Instructions (Signed)

## 2022-11-16 DIAGNOSIS — I10 Essential (primary) hypertension: Secondary | ICD-10-CM | POA: Diagnosis not present

## 2022-11-16 DIAGNOSIS — R0602 Shortness of breath: Secondary | ICD-10-CM | POA: Diagnosis not present

## 2022-11-16 DIAGNOSIS — I25708 Atherosclerosis of coronary artery bypass graft(s), unspecified, with other forms of angina pectoris: Secondary | ICD-10-CM | POA: Diagnosis not present

## 2022-11-16 DIAGNOSIS — I480 Paroxysmal atrial fibrillation: Secondary | ICD-10-CM | POA: Diagnosis not present

## 2022-11-18 DIAGNOSIS — E1165 Type 2 diabetes mellitus with hyperglycemia: Secondary | ICD-10-CM | POA: Diagnosis not present

## 2022-11-23 DIAGNOSIS — I10 Essential (primary) hypertension: Secondary | ICD-10-CM | POA: Diagnosis not present

## 2022-11-23 DIAGNOSIS — R918 Other nonspecific abnormal finding of lung field: Secondary | ICD-10-CM | POA: Diagnosis not present

## 2022-11-23 DIAGNOSIS — J449 Chronic obstructive pulmonary disease, unspecified: Secondary | ICD-10-CM | POA: Diagnosis not present

## 2022-11-23 DIAGNOSIS — R0602 Shortness of breath: Secondary | ICD-10-CM | POA: Diagnosis not present

## 2022-11-23 DIAGNOSIS — I480 Paroxysmal atrial fibrillation: Secondary | ICD-10-CM | POA: Diagnosis not present

## 2022-11-23 DIAGNOSIS — I25708 Atherosclerosis of coronary artery bypass graft(s), unspecified, with other forms of angina pectoris: Secondary | ICD-10-CM | POA: Diagnosis not present

## 2022-11-23 DIAGNOSIS — G4733 Obstructive sleep apnea (adult) (pediatric): Secondary | ICD-10-CM | POA: Diagnosis not present

## 2022-11-24 DIAGNOSIS — N529 Male erectile dysfunction, unspecified: Secondary | ICD-10-CM | POA: Diagnosis not present

## 2022-11-24 DIAGNOSIS — R69 Illness, unspecified: Secondary | ICD-10-CM | POA: Diagnosis not present

## 2022-11-24 DIAGNOSIS — F411 Generalized anxiety disorder: Secondary | ICD-10-CM | POA: Diagnosis not present

## 2022-11-24 DIAGNOSIS — G2581 Restless legs syndrome: Secondary | ICD-10-CM | POA: Diagnosis not present

## 2022-11-24 DIAGNOSIS — E1142 Type 2 diabetes mellitus with diabetic polyneuropathy: Secondary | ICD-10-CM | POA: Diagnosis not present

## 2022-11-24 DIAGNOSIS — R32 Unspecified urinary incontinence: Secondary | ICD-10-CM | POA: Diagnosis not present

## 2022-11-24 DIAGNOSIS — R42 Dizziness and giddiness: Secondary | ICD-10-CM | POA: Diagnosis not present

## 2022-11-24 DIAGNOSIS — K219 Gastro-esophageal reflux disease without esophagitis: Secondary | ICD-10-CM | POA: Diagnosis not present

## 2022-11-24 DIAGNOSIS — I252 Old myocardial infarction: Secondary | ICD-10-CM | POA: Diagnosis not present

## 2022-11-24 DIAGNOSIS — E785 Hyperlipidemia, unspecified: Secondary | ICD-10-CM | POA: Diagnosis not present

## 2022-11-24 DIAGNOSIS — F4321 Adjustment disorder with depressed mood: Secondary | ICD-10-CM | POA: Diagnosis not present

## 2022-11-24 DIAGNOSIS — G4733 Obstructive sleep apnea (adult) (pediatric): Secondary | ICD-10-CM | POA: Diagnosis not present

## 2022-11-25 ENCOUNTER — Other Ambulatory Visit: Payer: Self-pay | Admitting: Pulmonary Disease

## 2022-11-29 DIAGNOSIS — E538 Deficiency of other specified B group vitamins: Secondary | ICD-10-CM | POA: Diagnosis not present

## 2022-11-30 ENCOUNTER — Other Ambulatory Visit: Payer: Self-pay | Admitting: Pulmonary Disease

## 2022-12-01 DIAGNOSIS — E1165 Type 2 diabetes mellitus with hyperglycemia: Secondary | ICD-10-CM | POA: Diagnosis not present

## 2022-12-01 DIAGNOSIS — J449 Chronic obstructive pulmonary disease, unspecified: Secondary | ICD-10-CM | POA: Diagnosis not present

## 2022-12-01 DIAGNOSIS — I10 Essential (primary) hypertension: Secondary | ICD-10-CM | POA: Diagnosis not present

## 2022-12-03 NOTE — Telephone Encounter (Signed)
Ok refill albuterol (Ventolin) inhaler per OV note 08/31/2022.

## 2022-12-20 DIAGNOSIS — E1165 Type 2 diabetes mellitus with hyperglycemia: Secondary | ICD-10-CM | POA: Diagnosis not present

## 2022-12-21 DIAGNOSIS — I480 Paroxysmal atrial fibrillation: Secondary | ICD-10-CM | POA: Diagnosis not present

## 2022-12-21 DIAGNOSIS — R0602 Shortness of breath: Secondary | ICD-10-CM | POA: Diagnosis not present

## 2022-12-21 DIAGNOSIS — I10 Essential (primary) hypertension: Secondary | ICD-10-CM | POA: Diagnosis not present

## 2022-12-21 DIAGNOSIS — I25708 Atherosclerosis of coronary artery bypass graft(s), unspecified, with other forms of angina pectoris: Secondary | ICD-10-CM | POA: Diagnosis not present

## 2022-12-24 DIAGNOSIS — J449 Chronic obstructive pulmonary disease, unspecified: Secondary | ICD-10-CM | POA: Diagnosis not present

## 2022-12-24 DIAGNOSIS — G4733 Obstructive sleep apnea (adult) (pediatric): Secondary | ICD-10-CM | POA: Diagnosis not present

## 2022-12-29 ENCOUNTER — Ambulatory Visit: Payer: Medicare HMO | Admitting: Emergency Medicine

## 2022-12-29 ENCOUNTER — Ambulatory Visit (INDEPENDENT_AMBULATORY_CARE_PROVIDER_SITE_OTHER): Payer: Medicare HMO

## 2022-12-29 ENCOUNTER — Encounter: Payer: Self-pay | Admitting: Emergency Medicine

## 2022-12-29 VITALS — BP 136/78 | HR 91 | Temp 99.0°F | Ht 70.0 in | Wt 314.0 lb

## 2022-12-29 DIAGNOSIS — G4733 Obstructive sleep apnea (adult) (pediatric): Secondary | ICD-10-CM | POA: Diagnosis not present

## 2022-12-29 DIAGNOSIS — J811 Chronic pulmonary edema: Secondary | ICD-10-CM | POA: Diagnosis not present

## 2022-12-29 DIAGNOSIS — J90 Pleural effusion, not elsewhere classified: Secondary | ICD-10-CM | POA: Diagnosis not present

## 2022-12-29 DIAGNOSIS — R0602 Shortness of breath: Secondary | ICD-10-CM

## 2022-12-29 DIAGNOSIS — J441 Chronic obstructive pulmonary disease with (acute) exacerbation: Secondary | ICD-10-CM | POA: Diagnosis not present

## 2022-12-29 MED ORDER — PREDNISONE 10 MG PO TABS: ORAL_TABLET | ORAL | 0 refills | Status: DC

## 2022-12-29 MED ORDER — DOXYCYCLINE HYCLATE 100 MG PO TABS: 100.0000 mg | ORAL_TABLET | Freq: Two times a day (BID) | ORAL | 0 refills | Status: DC

## 2022-12-29 NOTE — Assessment & Plan Note (Signed)
CPAP +3 L/min.  Reliable and benefiting.

## 2022-12-29 NOTE — Assessment & Plan Note (Signed)
Subacute and almost certainly multifactorial.  That said has not changed with a slight increase in his diuretics.  He had a chest x-ray 11/23/2022 that reportedly showed a right upper lobe infiltrate.  We will repeat a chest x-ray today.  He is wheezing on exam and I will treat him for possible acute exacerbation of his COPD +/- CAP.  Need to assess and rule out occult amatory hypoxemia.

## 2022-12-29 NOTE — Progress Notes (Signed)
Subjective:    Patient ID: [name], male    DOB: [birth year], 62 y.o.   MRN: [id number]  HPI Acute office visit 12/29/2022 --  62 year old man with a history of active tobacco use (40 pk-yrs) followed by Dr. Elsworth Soho in our office for COPD, OSA on CPAP, chronic hypoxemic respiratory failure.  He has also undergone SBRT to enlarging right upper lobe pulmonary nodule in 04/2021.  Finally he had COVID-19 in mid December 2023.  Maintenance: Trelegy, using O2 bled into his CPAP but he does not have any O2 to use w exertion.  PMH otherwise significant for CAD/CABG, diabetes, paroxysmal atrial fibrillation on AC, diastolic CHF on diuretics  He presents today reporting that he has been experiencing exertional and resting SOB for several weeks, possibly months. Evaluated by Cardiology 11/23/22, furosemide was changed to '40mg'$  qd (he was taking '80mg'$  about every 3rd day). He has dyspnea, R sided chest discomfort with ambulation.  Breathing has not changed significantly with the change in furosemide.  He hears wheeze daily. Taking Trelegy reliably. He is coughing up clear to white mucous, no hemoptysis. No fever. He is using albuterol about 2-3x a day, does not give him full relief.    Review of Systems As per HPI  Past Medical History:  Diagnosis Date   Abnormal myocardial perfusion study 01/01/2011   there a small to moderate sized inferobasal scar   Barrett's esophagus    Cataracts, bilateral    Chronic kidney disease    hx of kidney stones   Claudication ([place]) 11/16/2011   PV test perform shows normal   COPD (chronic obstructive pulmonary disease) (HCC)    Depression    Diabetes (HCC)    type 2 diabetes mellitus   Dysrhythmia    GERD (gastroesophageal reflux disease)    Glaucoma    Hernia of abdominal wall    History of radiation therapy    Right lung, SBRT- 04/16/21-04/30/21- Dr. Gery Pray   HTN (hypertension)    Hyperlipidemia    Morbid obesity ([place])    Myocardial infarction  ([place]) 2008,2009,2009   OSA (obstructive sleep apnea)    on cpap   PAF (paroxysmal atrial fibrillation) (HCC)    S/P colonoscopy 2009   3-4 mm transverse colon erosions likely secondary to  ASA   S/P endoscopy 10/2010   moderate erosive gastritis, Barrett's esophagus 1-2cm   Sleep apnea    SOB (shortness of breath) 11/03/2007   2D Echo EF 50%-55%   Stroke ([place]) 2017     Family History  Problem Relation Age of Onset   Diabetes Mother    Heart disease Mother    Diabetes Father    Heart disease Father    Pancreatic cancer Brother        age 9, doing well   Colon cancer Neg Hx    Anesthesia problems Neg Hx    Hypotension Neg Hx    Malignant hyperthermia Neg Hx    Pseudochol deficiency Neg Hx      Social History   Socioeconomic History   Marital status: Divorced    Spouse name: Not on file   Number of children: Not on file   Years of education: Not on file   Highest education level: Not on file  Occupational History   Not on file  Tobacco Use   Smoking status: Every Day    Packs/day: 0.50    Years: 30.00    Total pack years: 15.00    Types:  Cigarettes   Smokeless tobacco: Never   Tobacco comments:    Smokes 0.25 packs per day [facility], 12/29/22  Vaping Use   Vaping Use: Never used  Substance and Sexual Activity   Alcohol use: Yes    Comment: occ beer   Drug use: No   Sexual activity: Never  Other Topics Concern   Not on file  Social History Narrative   Not on file   Social Determinants of Health   Financial Resource Strain: High Risk (08/23/2022)   Overall Financial Resource Strain (CARDIA)    Difficulty of Paying Living Expenses: Very hard  Food Insecurity: No Food Insecurity (10/13/2022)   Hunger Vital Sign    Worried About Running Out of Food in the Last Year: Never true    Ran Out of Food in the Last Year: Never true  Recent Concern: Food Insecurity - Food Insecurity Present (08/23/2022)   Hunger Vital Sign    Worried About Running Out of Food in the  Last Year: Often true    Ran Out of Food in the Last Year: Often true  Transportation Needs: No Transportation Needs (10/13/2022)   PRAPARE - Hydrologist (Medical): No    Lack of Transportation (Non-Medical): No  Physical Activity: Not on file  Stress: Not on file  Social Connections: Not on file  Intimate Partner Violence: Not At Risk (10/13/2022)   Humiliation, Afraid, Rape, and Kick questionnaire    Fear of Current or Ex-Partner: No    Emotionally Abused: No    Physically Abused: No    Sexually Abused: No     Allergies  Allergen Reactions   Contrast Media [Iodinated Contrast Media] Other (See Comments)    Pt must be premedicated before given contrast media - stops heart   Iohexol Other (See Comments)     Consult with radiologist before pre meds are given.Desc: PT. STATES HEART STOPPED HAS TO BE PREMED.      Outpatient Medications Prior to Visit  Medication Sig Dispense Refill   albuterol (PROVENTIL) (2.5 MG/3ML) 0.083% nebulizer solution Take 3 mLs (2.5 mg total) by nebulization every 6 (six) hours as needed for wheezing or shortness of breath. 75 mL 12   albuterol (VENTOLIN HFA) 108 (90 Base) MCG/ACT inhaler INHALE TWO PUFFS BY MOUTH INTO LUNGS EVERY 6 HOURS AS NEEDED FOR WHEEZING AND/OR SHORTNESS OF BREATH 8.5 g 3   apixaban (ELIQUIS) 5 MG TABS tablet Take 1 tablet (5 mg total) by mouth 2 (two) times daily. 60 tablet 1   ascorbic acid (VITAMIN C) 500 MG tablet Take 1 tablet (500 mg total) by mouth daily. 30 tablet 0   B-D ULTRAFINE III SHORT PEN 31G X 8 MM MISC USE AS DIRECTED TWICE DAILY 100 each 4   blood glucose meter kit and supplies Dispense based on patient and insurance preference. Use up to two times daily as directed. (FOR ICD-E11.65) 1 each 5   Blood Glucose Monitoring Suppl (ONETOUCH VERIO) w/Device KIT 1 each by Does not apply route as needed. 1 kit 0   Continuous Blood Gluc Receiver (FREESTYLE LIBRE 2 READER) DEVI As directed 1 each 0    Continuous Blood Gluc Sensor (FREESTYLE LIBRE 2 SENSOR) MISC 1 Piece by Does not apply route every 14 (fourteen) days. 2 each 2   Cyanocobalamin (VITAMIN B-12 IJ) Inject 1,000 mcg as directed every 30 (thirty) days.     diazepam (VALIUM) 5 MG tablet Take 5 mg by mouth 2 (two) times  daily.     escitalopram (LEXAPRO) 20 MG tablet Take 20 mg by mouth in the morning.     fenofibrate (TRICOR) 145 MG tablet Take 145 mg by mouth in the morning.     Fluticasone-Umeclidin-Vilant (TRELEGY ELLIPTA) 100-62.5-25 MCG/ACT AEPB Inhale 1 puff into the lungs daily. 60 each 2   furosemide (LASIX) 80 MG tablet Take 80 mg by mouth in the morning.  4   gabapentin (NEURONTIN) 300 MG capsule Take 300 mg by mouth 3 (three) times daily.     insulin regular human CONCENTRATED (HUMULIN R U-500 KWIKPEN) 500 UNIT/ML KwikPen Inject 60 Units into the skin 3 (three) times daily with meals. Only when blood glucose is above 90 and eating. 12 mL 2   isosorbide mononitrate (IMDUR) 120 MG 24 hr tablet Take 120 mg by mouth in the morning.     Lancets (ONETOUCH DELICA PLUS 123XX123) MISC USE TO check blood sugar FOUR TIMES DAILY AS DIRECTED 200 each 1   lisinopril (ZESTRIL) 10 MG tablet Take 1 tablet (10 mg total) by mouth daily. 90 tablet 1   meclizine (ANTIVERT) 25 MG tablet Take 25 mg by mouth every 6 (six) hours as needed for dizziness or nausea.     metoprolol tartrate (LOPRESSOR) 100 MG tablet Take 1 tablet (100 mg total) by mouth 2 (two) times daily. OFFICE VISIT NEEDED BEFORE ADDITIONAL REFILLS 28 tablet 0   modafinil (PROVIGIL) 200 MG tablet Take 200 mg by mouth 2 (two) times daily.     nitroGLYCERIN (NITROLINGUAL) 0.4 MG/SPRAY spray Place 1 spray under the tongue every 5 (five) minutes x 3 doses as needed for chest pain. 12 g 0   ONETOUCH VERIO test strip USE TO check blood sugar FOUR TIMES DAILY AS DIRECTED 200 strip 1   pantoprazole (PROTONIX) 40 MG tablet TAKE ONE TABLET BY MOUTH TWICE DAILY BEFORE A meal 180 tablet 3    potassium chloride SA (KLOR-CON M) 20 MEQ tablet Take 1 tablet (20 mEq total) by mouth daily. While Taking Lasix/Furosemide 30 tablet 0   rosuvastatin (CRESTOR) 20 MG tablet Take 20 mg by mouth in the morning.     senna-docusate (SENOKOT-S) 8.6-50 MG tablet Take 2 tablets by mouth at bedtime. 60 tablet 1   Vitamin D, Ergocalciferol, (DRISDOL) 1.25 MG (50000 UNIT) CAPS capsule Take 1 capsule (50,000 Units total) by mouth every 7 (seven) days. (Patient taking differently: Take 50,000 Units by mouth every Friday.) 12 capsule 0   zinc sulfate 220 (50 Zn) MG capsule Take 1 capsule (220 mg total) by mouth daily. 30 capsule 0   No facility-administered medications prior to visit.        Objective:   Physical Exam Vitals:   12/29/22 1408  BP: 136/78  Pulse: 91  Temp: 99 F (37.2 C)  TempSrc: Oral  SpO2: 96%  Weight: (!) 314 lb (142.4 kg)  Height: '5\' 10"'$  (1.778 m)    Gen: Pleasant, well-nourished, in no distress,  normal affect  ENT: No lesions,  mouth clear,  oropharynx clear, no postnasal drip  Neck: No JVD, no stridor  Lungs: No use of accessory muscles, no crackles or wheezing on normal respiration, no wheeze on forced expiration  Cardiovascular: RRR, heart sounds normal, no murmur or gallops, no peripheral edema  Musculoskeletal: No deformities, no cyanosis or clubbing  Neuro: alert, awake, non focal  Skin: Warm, no lesions or rash       Assessment & Plan:   SOB (shortness of breath) Subacute and  almost certainly multifactorial.  That said has not changed with a slight increase in his diuretics.  He had a chest x-ray 11/23/2022 that reportedly showed a right upper lobe infiltrate.  We will repeat a chest x-ray today.  He is wheezing on exam and I will treat him for possible acute exacerbation of his COPD +/- CAP.  Need to assess and rule out occult amatory hypoxemia.  COPD with acute exacerbation (HCC) Doxycycline x 7 days, prednisone taper Continue Trelegy, albuterol as  needed.  OSA (obstructive sleep apnea) CPAP +3 L/min.  Reliable and benefiting.  Time spent 43 minutes  Baltazar Apo, MD, PhD 12/29/2022, 2:35 PM [facility] [phone] or if no answer before 7:00PM call [phone] For any issues after 7:00PM please call eLink [phone]

## 2022-12-29 NOTE — Addendum Note (Signed)
Addended by: Dierdre Highman on: 12/29/2022 02:51 PM   Modules accepted: Orders

## 2022-12-29 NOTE — Patient Instructions (Addendum)
Chest x-ray today Please continue your Trelegy 1 inhalation once daily.  Rinse and gargle after using. Keep albuterol available to use 2 puffs or 1 nebulizer treatment when you needed for shortness of breath, chest tightness, wheezing. Please take prednisone as directed until completely gone Please take doxycycline as directed until completely gone Continue CPAP + oxygen 3 L/min at night while sleeping We will perform a walking oximetry today on room air to see if you qualify for oxygen while awake and while exerting yourself. Follow Dr. Elsworth Soho next available to assess your status.  Please call us if you are worsening in any way.

## 2022-12-29 NOTE — Assessment & Plan Note (Signed)
Doxycycline x 7 days, prednisone taper Continue Trelegy, albuterol as needed.

## 2022-12-30 DIAGNOSIS — I10 Essential (primary) hypertension: Secondary | ICD-10-CM | POA: Diagnosis not present

## 2022-12-30 DIAGNOSIS — E1165 Type 2 diabetes mellitus with hyperglycemia: Secondary | ICD-10-CM | POA: Diagnosis not present

## 2022-12-30 DIAGNOSIS — E538 Deficiency of other specified B group vitamins: Secondary | ICD-10-CM | POA: Diagnosis not present

## 2022-12-30 DIAGNOSIS — J449 Chronic obstructive pulmonary disease, unspecified: Secondary | ICD-10-CM | POA: Diagnosis not present

## 2023-01-03 DIAGNOSIS — J449 Chronic obstructive pulmonary disease, unspecified: Secondary | ICD-10-CM | POA: Diagnosis not present

## 2023-01-05 ENCOUNTER — Telehealth: Payer: Self-pay | Admitting: Emergency Medicine

## 2023-01-05 NOTE — Telephone Encounter (Signed)
[facility] calling req for clinical note from 12/29/2022  Fax: [phone]

## 2023-01-05 NOTE — Telephone Encounter (Signed)
Went over chest xray results with patient. He verbalized understanding. Nothing further needed.

## 2023-01-05 NOTE — Telephone Encounter (Signed)
OV notes faxed, closing encounter.

## 2023-01-05 NOTE — Telephone Encounter (Signed)
Called and spoke w/ Gilmore Laroche from [facility], she is needing OV notes to close referral

## 2023-01-05 NOTE — Telephone Encounter (Signed)
Dr. Lamonte Sakai can you please advise on chest xray results

## 2023-01-05 NOTE — Telephone Encounter (Signed)
Please let the patient know that his CXR showed improvement compared with his prior > less lung fluid present, decreased pleural effusions.

## 2023-01-05 NOTE — Telephone Encounter (Signed)
Pt calling for XRay results

## 2023-01-20 DIAGNOSIS — E1165 Type 2 diabetes mellitus with hyperglycemia: Secondary | ICD-10-CM | POA: Diagnosis not present

## 2023-01-21 DIAGNOSIS — G4733 Obstructive sleep apnea (adult) (pediatric): Secondary | ICD-10-CM | POA: Diagnosis not present

## 2023-01-21 DIAGNOSIS — J449 Chronic obstructive pulmonary disease, unspecified: Secondary | ICD-10-CM | POA: Diagnosis not present

## 2023-01-30 DIAGNOSIS — E1165 Type 2 diabetes mellitus with hyperglycemia: Secondary | ICD-10-CM | POA: Diagnosis not present

## 2023-01-30 DIAGNOSIS — J449 Chronic obstructive pulmonary disease, unspecified: Secondary | ICD-10-CM | POA: Diagnosis not present

## 2023-01-30 DIAGNOSIS — I10 Essential (primary) hypertension: Secondary | ICD-10-CM | POA: Diagnosis not present

## 2023-01-31 DIAGNOSIS — E538 Deficiency of other specified B group vitamins: Secondary | ICD-10-CM | POA: Diagnosis not present

## 2023-02-03 DIAGNOSIS — J449 Chronic obstructive pulmonary disease, unspecified: Secondary | ICD-10-CM | POA: Diagnosis not present

## 2023-02-04 ENCOUNTER — Ambulatory Visit (HOSPITAL_COMMUNITY): Payer: Medicare HMO

## 2023-02-04 ENCOUNTER — Ambulatory Visit (HOSPITAL_COMMUNITY)
Admission: RE | Admit: 2023-02-04 | Discharge: 2023-02-04 | Disposition: A | Discharge status: Home / Self Care | Payer: Medicare HMO | Source: Ambulatory Visit | Attending: Pulmonary Disease | Admitting: Pulmonary Disease

## 2023-02-04 DIAGNOSIS — R911 Solitary pulmonary nodule: Secondary | ICD-10-CM | POA: Diagnosis not present

## 2023-02-04 DIAGNOSIS — J439 Emphysema, unspecified: Secondary | ICD-10-CM | POA: Diagnosis not present

## 2023-02-04 DIAGNOSIS — J441 Chronic obstructive pulmonary disease with (acute) exacerbation: Secondary | ICD-10-CM | POA: Diagnosis not present

## 2023-02-04 DIAGNOSIS — J449 Chronic obstructive pulmonary disease, unspecified: Secondary | ICD-10-CM | POA: Insufficient documentation

## 2023-02-07 ENCOUNTER — Encounter (HOSPITAL_BASED_OUTPATIENT_CLINIC_OR_DEPARTMENT_OTHER): Payer: Self-pay | Admitting: Pulmonary Disease

## 2023-02-07 ENCOUNTER — Ambulatory Visit (HOSPITAL_BASED_OUTPATIENT_CLINIC_OR_DEPARTMENT_OTHER): Payer: Medicare HMO | Admitting: Pulmonary Disease

## 2023-02-07 VITALS — BP 140/60 | HR 88 | Ht 66.0 in | Wt 313.8 lb

## 2023-02-07 DIAGNOSIS — J9611 Chronic respiratory failure with hypoxia: Secondary | ICD-10-CM

## 2023-02-07 DIAGNOSIS — R69 Illness, unspecified: Secondary | ICD-10-CM | POA: Diagnosis not present

## 2023-02-07 DIAGNOSIS — R0602 Shortness of breath: Secondary | ICD-10-CM

## 2023-02-07 DIAGNOSIS — R918 Other nonspecific abnormal finding of lung field: Secondary | ICD-10-CM

## 2023-02-07 DIAGNOSIS — F17200 Nicotine dependence, unspecified, uncomplicated: Secondary | ICD-10-CM

## 2023-02-07 DIAGNOSIS — J441 Chronic obstructive pulmonary disease with (acute) exacerbation: Secondary | ICD-10-CM | POA: Diagnosis not present

## 2023-02-07 DIAGNOSIS — C3411 Malignant neoplasm of upper lobe, right bronchus or lung: Secondary | ICD-10-CM | POA: Diagnosis not present

## 2023-02-07 MED ORDER — TRELEGY ELLIPTA 100-62.5-25 MCG/ACT IN AEPB: 1.0000 | INHALATION_SPRAY | Freq: Every day | RESPIRATORY_TRACT | 2 refills | Status: DC

## 2023-02-07 NOTE — Assessment & Plan Note (Signed)
Status post SBRT with nodular infiltrate in the right upper lobe which appears slightly increased in the interval from last scan.  I still think this is the radiation changes in the right upper lobe but will obtain a 7-month follow-up CT scan as advised by radiology

## 2023-02-07 NOTE — Assessment & Plan Note (Signed)
Smoking cessation was again emphasized is the most important intervention that would add years to his life 

## 2023-02-07 NOTE — Assessment & Plan Note (Signed)
His oxygen saturation was 87% on walking to the exam room.  He recovered with oxygen.  He was able to maintain at 91% on 3 L pulsed.  No shortness of breath recommend 3 L POC with ambulation and 2 L continuous blended into CPAP should suffice

## 2023-02-07 NOTE — Assessment & Plan Note (Signed)
His breathing is worse but he does not appear to be having a COPD exacerbation.  There is no bronchospasm.  He does not appear to be in overt fluid overload, weight is unchanged We will provide him refills on Trelegy.  He will use albuterol on an as-needed basis. We discussed signs and symptoms of COPD exacerbation and action plan

## 2023-02-07 NOTE — Patient Instructions (Signed)
X Ct chest wo con in  54months  X refills on trelegy  X walk on POC 3-4 L

## 2023-02-07 NOTE — Progress Notes (Signed)
   Subjective:    Patient ID: [name], male    DOB: [birth year], 62 y.o.   MRN: [id number]  HPI  62 yo obese smoker  for FU of  COPD, OSA, lung nodules & chronic hypoxic resp failure S/p SBRT 04/2021 for enlarging right upper lobe nodule/lung cancer OSA -on CPAP 8 cm + 3L O2 He smoked more than 40 pack years   PMH -CABG 2008 follows with [facility]ology,  diabetes, insulin requiring , PAF on eliquis Cor angio 03/2020  2/4 grafts patent, LIMA to LAD and SVG to Ramus. Occluded SVG to RCA, occluded SVG to OM. Both of these are old findings nml LVEF    Chief Complaint  Patient presents with   Follow-up    Doe from lobby to room 87% recovered to 91% sitting   60-month follow-up visit with me.  His breathing is "up-and-down".  His weight is unchanged.  He continues to smoke half pack per day. He had a cardiology visit 11/2022, Lasix time was changed to 40 mg daily. He had an interim sick visit 12/2022 with my partner for shortness of breath, had increased wheezing, was given doxycycline and prednisone. Chest x-ray showed increased interstitial markings perihilar regions and clearing of bilateral pleural effusions. We reviewed CT chest today   Significant tests/ events reviewed   CT chest without con 02/04/23 patchy right upper lobe infiltrate, nodular, 2.2 x 1.4 cm slightly increased other nodules stable   CT chest wo con 08/2022 >> Paraseptal and centrilobular emphysema, Scattered pulmonary nodules bilaterally, stable to slightly increased in size from the prior exam. The largest nodule measures 54mm.   CT chest wo con 03/2021 >> subsolid 1.5 x 1.5 cm nodule RUL increasing in size & density , other nodules stable   PFTs 06/2020 moderate airway obstruction with ratio of 70, FEV1 1.90/51%, FVC 2.70/55%, no bronchodilator response, TLC 76%, DLCO 16.5/58%   PET 09/2020 >> SUV 2.1   CT chest 06/2020 RUL GG 12 x 10 mmnodule     CT chest 12/2018 advanced changes of emphysema ,  multiple noncalcified nodules unchanged compared to 09/2018, largest right upper lobe 9 mm slight increased compared to 12/2016   NPSG 11/2007 mild OSA 5.7/hour 07/2015 NPSG AHI 16/h   06/2016 CPAP titration-2 9 7  pounds, no O2 required CPAP 8cm  Review of Systems neg for any significant sore throat, dysphagia, itching, sneezing, nasal congestion or excess/ purulent secretions, fever, chills, sweats, unintended wt loss, pleuritic or exertional cp, hempoptysis, orthopnea pnd or change in chronic leg swelling. Also denies presyncope, palpitations, heartburn, abdominal pain, nausea, vomiting, diarrhea or change in bowel or urinary habits, dysuria,hematuria, rash, arthralgias, visual complaints, headache, numbness weakness or ataxia.     Objective:   Physical Exam  Gen. Pleasant, obese, in no distress ENT - no lesions, no post nasal drip Neck: No JVD, no thyromegaly, no carotid bruits Lungs: no use of accessory muscles, no dullness to percussion, decreased without rales or rhonchi  Cardiovascular: Rhythm regular, heart sounds  normal, no murmurs or gallops, no peripheral edema Musculoskeletal: No deformities, no cyanosis or clubbing , no tremors       Assessment & Plan:

## 2023-02-09 DIAGNOSIS — J449 Chronic obstructive pulmonary disease, unspecified: Secondary | ICD-10-CM | POA: Diagnosis not present

## 2023-02-09 DIAGNOSIS — R0602 Shortness of breath: Secondary | ICD-10-CM | POA: Diagnosis not present

## 2023-02-10 ENCOUNTER — Encounter: Payer: Self-pay | Admitting: "Endocrinology

## 2023-02-10 ENCOUNTER — Ambulatory Visit: Payer: Medicare HMO | Admitting: "Endocrinology

## 2023-02-10 VITALS — BP 92/66 | HR 88 | Ht 70.0 in | Wt 312.8 lb

## 2023-02-10 DIAGNOSIS — E1159 Type 2 diabetes mellitus with other circulatory complications: Secondary | ICD-10-CM | POA: Diagnosis not present

## 2023-02-10 DIAGNOSIS — I10 Essential (primary) hypertension: Secondary | ICD-10-CM | POA: Diagnosis not present

## 2023-02-10 DIAGNOSIS — E782 Mixed hyperlipidemia: Secondary | ICD-10-CM

## 2023-02-10 DIAGNOSIS — F17200 Nicotine dependence, unspecified, uncomplicated: Secondary | ICD-10-CM

## 2023-02-10 DIAGNOSIS — R69 Illness, unspecified: Secondary | ICD-10-CM | POA: Diagnosis not present

## 2023-02-10 LAB — POCT GLYCOSYLATED HEMOGLOBIN (HGB A1C): HbA1c, POC (controlled diabetic range): 8.8 % — AB (ref 0.0–7.0)

## 2023-02-10 NOTE — Progress Notes (Signed)
02/10/2023  Endocrinology follow-up note   Subjective:    Patient ID: [name], male    DOB: 20-Apr-1961.  he is being seen in follow-up  for management of currently uncontrolled symptomatic type 2 diabetes, hypertension, hyperlipidemia, obesity. PMD:  Assunta Found, MD.   Past Medical History:  Diagnosis Date   Abnormal myocardial perfusion study 01/01/2011   there a small to moderate sized inferobasal scar   Barrett's esophagus    Cataracts, bilateral    Chronic kidney disease    hx of kidney stones   Claudication 11/16/2011   PV test perform shows normal   COPD (chronic obstructive pulmonary disease)    Depression    Diabetes    type 2 diabetes mellitus   Dysrhythmia    GERD (gastroesophageal reflux disease)    Glaucoma    Hernia of abdominal wall    History of radiation therapy    Right lung, SBRT- 04/16/21-04/30/21- Dr. Antony Blackbird   HTN (hypertension)    Hyperlipidemia    Morbid obesity    Myocardial infarction 2008,2009,2009   OSA (obstructive sleep apnea)    on cpap   PAF (paroxysmal atrial fibrillation)    S/P colonoscopy 2009   3-4 mm transverse colon erosions likely secondary to  ASA   S/P endoscopy 10/2010   moderate erosive gastritis, Barrett's esophagus 1-2cm   Sleep apnea    SOB (shortness of breath) 11/03/2007   2D Echo EF 50%-55%   Stroke 2017   Past Surgical History:  Procedure Laterality Date   BIOPSY N/A 04/29/2015   Procedure: BIOPSY;  Surgeon: West Bali, MD;  Location: [facility];  Service: Endoscopy;  Laterality: N/A;   BIOPSY  08/01/2018   Procedure: BIOPSY;  Surgeon: West Bali, MD;  Location: [facility];  Service: Endoscopy;;  esophageal   BIOPSY  10/12/2021   Procedure: BIOPSY;  Surgeon: Lanelle Bal, DO;  Location: [facility];  Service: Endoscopy;;   CABG X 4  03/2008   CARDIAC CATHETERIZATION  2009   stent placement to the left circumflex a 2.25    CARDIAC CATHETERIZATION  07/08/2010   CARDIAC  CATHETERIZATION N/A 11/02/2019   COLONOSCOPY WITH PROPOFOL N/A 09/27/2017   normal ileum, twenty 4 to 8 mm polyps in the sigmoid colon, descending colon, splenic flexure, transverse colon, ascending colon, cecum.  An additional three 2 to 4 mm polyps in the rectum and the descending colon.  Diverticulosis, internal hemorrhoids.  Surgical pathology found the polyps to be one fragment of hyperplastic polyp and 22 fragments of tubular adenoma..  Recommended repeat colonoscop   COLONOSCOPY WITH PROPOFOL N/A 10/12/2021   Procedure: COLONOSCOPY WITH PROPOFOL;  Surgeon: Lanelle Bal, DO;  Location: [facility];  Service: Endoscopy;  Laterality: N/A;  1:30pm   CORONARY ARTERY BYPASS GRAFT  2008   4 vessels   CORONARY STENT PLACEMENT  12/29/12   CORONARY STENT PLACEMENT  12/2012   ESOPHAGEAL DILATION N/A 04/29/2015   Procedure: ESOPHAGEAL DILATION 15 mm, 16 mm;  Surgeon: West Bali, MD;  Location: [facility];  Service: Endoscopy;  Laterality: N/A;   ESOPHAGOGASTRODUODENOSCOPY  09/2011   Barrett's esophagus, no dysplasia on biopsies. Distal esophagitis. Status post dilation. Moderate gastritis and duodenitis, but biopsies benign. Next EGD in November 2015 for surveillance of Barrett's esophagus.   ESOPHAGOGASTRODUODENOSCOPY (EGD) WITH PROPOFOL N/A 04/29/2015   SLF: 1. Barretts esophagus 2. Moderate non-erosive gastritis.    ESOPHAGOGASTRODUODENOSCOPY (EGD) WITH PROPOFOL N/A 08/01/2018  Barrett's, repeat in 5 years. Empiric dilatation, mild gastritis   HERNIA REPAIR     ventral hernia repair   LEFT HEART CATHETERIZATION WITH CORONARY/GRAFT ANGIOGRAM N/A 12/29/2012   Procedure: LEFT HEART CATHETERIZATION WITH Isabel Caprice;  Surgeon: Lennette Bihari, MD;  Location: [facility] C[facility];  Service: Cardiovascular;  Laterality: N/A;   ORIF FIBULA FRACTURE Right 06/03/2015   Procedure: OPEN REDUCTION INTERNAL FIXATION (ORIF) DISTAL FIBULA  FRACTURE;  Surgeon: Valeria Batman, MD;  Location: [facility];  Service:  Orthopedics;  Laterality: Right;   PERCUTANEOUS CORONARY STENT INTERVENTION (PCI-S)  12/29/2012   Procedure: PERCUTANEOUS CORONARY STENT INTERVENTION (PCI-S);  Surgeon: Lennette Bihari, MD;  Location: [facility] C[facility] LAB;  Service: Cardiovascular;;   POLYPECTOMY  09/27/2017   Procedure: POLYPECTOMY;  Surgeon: West Bali, MD;  Location: [facility];  Service: Endoscopy;;  cecal polyp, ascending polyps x6, transverse colon polyps x6, splenic flexure polyps x2, descending colon polyps x6, sigmoid  colon polyp x1, rectal polyp x1    POLYPECTOMY  10/12/2021   Procedure: POLYPECTOMY;  Surgeon: Lanelle Bal, DO;  Location: [facility];  Service: Endoscopy;;   SAVORY DILATION  09/06/2011   Procedure: SAVORY DILATION;  Surgeon: Arlyce Harman, MD;  Location: [facility];  Service: Endoscopy;  Laterality: N/A;  Dilated with 15mm   SAVORY DILATION N/A 08/01/2018   Procedure: SAVORY DILATION;  Surgeon: West Bali, MD;  Location: [facility];  Service: Endoscopy;  Laterality: N/A;   Social History   Socioeconomic History   Marital status: Divorced    Spouse name: Not on file   Number of children: Not on file   Years of education: Not on file   Highest education level: Not on file  Occupational History   Not on file  Tobacco Use   Smoking status: Every Day    Packs/day: 0.50    Years: 30.00    Additional pack years: 0.00    Total pack years: 15.00    Types: Cigarettes   Smokeless tobacco: Never   Tobacco comments:    Smokes 0.25 packs per day [facility], 12/29/22  Vaping Use   Vaping Use: Never used  Substance and Sexual Activity   Alcohol use: Yes    Comment: occ beer   Drug use: No   Sexual activity: Never  Other Topics Concern   Not on file  Social History Narrative   Not on file   Social Determinants of Health   Financial Resource Strain: High Risk (08/23/2022)   Overall Financial Resource Strain (CARDIA)    Difficulty of Paying Living Expenses: Very hard  Food Insecurity: No Food  Insecurity (10/13/2022)   Hunger Vital Sign    Worried About Running Out of Food in the Last Year: Never true    Ran Out of Food in the Last Year: Never true  Recent Concern: Food Insecurity - Food Insecurity Present (08/23/2022)   Hunger Vital Sign    Worried About Running Out of Food in the Last Year: Often true    Ran Out of Food in the Last Year: Often true  Transportation Needs: No Transportation Needs (10/13/2022)   PRAPARE - Administrator, Civil Service (Medical): No    Lack of Transportation (Non-Medical): No  Physical Activity: Not on file  Stress: Not on file  Social Connections: Not on file   Outpatient Encounter Medications as of 02/10/2023  Medication Sig   albuterol (PROVENTIL) (2.5 MG/3ML) 0.083% nebulizer solution Take 3  mLs (2.5 mg total) by nebulization every 6 (six) hours as needed for wheezing or shortness of breath.   albuterol (VENTOLIN HFA) 108 (90 Base) MCG/ACT inhaler INHALE TWO PUFFS BY MOUTH INTO LUNGS EVERY 6 HOURS AS NEEDED FOR WHEEZING AND/OR SHORTNESS OF BREATH   apixaban (ELIQUIS) 5 MG TABS tablet Take 1 tablet (5 mg total) by mouth 2 (two) times daily.   ascorbic acid (VITAMIN C) 500 MG tablet Take 1 tablet (500 mg total) by mouth daily.   B-D ULTRAFINE III SHORT PEN 31G X 8 MM MISC USE AS DIRECTED TWICE DAILY   blood glucose meter kit and supplies Dispense based on patient and insurance preference. Use up to two times daily as directed. (FOR ICD-E11.65)   Blood Glucose Monitoring Suppl (ONETOUCH VERIO) w/Device KIT 1 each by Does not apply route as needed.   Continuous Blood Gluc Receiver (FREESTYLE LIBRE 2 READER) DEVI As directed   Continuous Blood Gluc Sensor (FREESTYLE LIBRE 2 SENSOR) MISC 1 Piece by Does not apply route every 14 (fourteen) days.   Cyanocobalamin (VITAMIN B-12 IJ) Inject 1,000 mcg as directed every 30 (thirty) days.   diazepam (VALIUM) 5 MG tablet Take 5 mg by mouth 2 (two) times daily.   doxycycline (VIBRA-TABS) 100 MG  tablet Take 1 tablet (100 mg total) by mouth 2 (two) times daily.   escitalopram (LEXAPRO) 20 MG tablet Take 20 mg by mouth in the morning.   fenofibrate (TRICOR) 145 MG tablet Take 145 mg by mouth in the morning.   Fluticasone-Umeclidin-Vilant (TRELEGY ELLIPTA) 100-62.5-25 MCG/ACT AEPB Inhale 1 puff into the lungs daily.   furosemide (LASIX) 80 MG tablet Take 80 mg by mouth in the morning.   gabapentin (NEURONTIN) 300 MG capsule Take 300 mg by mouth 3 (three) times daily.   insulin regular human CONCENTRATED (HUMULIN R U-500 KWIKPEN) 500 UNIT/ML KwikPen Inject 60 Units into the skin 3 (three) times daily with meals. Only when blood glucose is above 90 and eating.   isosorbide mononitrate (IMDUR) 120 MG 24 hr tablet Take 120 mg by mouth in the morning.   Lancets (ONETOUCH DELICA PLUS LANCET33G) MISC USE TO check blood sugar FOUR TIMES DAILY AS DIRECTED   lisinopril (ZESTRIL) 10 MG tablet Take 1 tablet (10 mg total) by mouth daily.   meclizine (ANTIVERT) 25 MG tablet Take 25 mg by mouth every 6 (six) hours as needed for dizziness or nausea.   metoprolol tartrate (LOPRESSOR) 100 MG tablet Take 1 tablet (100 mg total) by mouth 2 (two) times daily. OFFICE VISIT NEEDED BEFORE ADDITIONAL REFILLS   modafinil (PROVIGIL) 200 MG tablet Take 200 mg by mouth 2 (two) times daily.   nitroGLYCERIN (NITROLINGUAL) 0.4 MG/SPRAY spray Place 1 spray under the tongue every 5 (five) minutes x 3 doses as needed for chest pain.   ONETOUCH VERIO test strip USE TO check blood sugar FOUR TIMES DAILY AS DIRECTED   pantoprazole (PROTONIX) 40 MG tablet TAKE ONE TABLET BY MOUTH TWICE DAILY BEFORE A meal   potassium chloride SA (KLOR-CON M) 20 MEQ tablet Take 1 tablet (20 mEq total) by mouth daily. While Taking Lasix/Furosemide   rosuvastatin (CRESTOR) 20 MG tablet Take 20 mg by mouth in the morning.   senna-docusate (SENOKOT-S) 8.6-50 MG tablet Take 2 tablets by mouth at bedtime.   Vitamin D, Ergocalciferol, (DRISDOL) 1.25 MG  (50000 UNIT) CAPS capsule Take 1 capsule (50,000 Units total) by mouth every 7 (seven) days. (Patient taking differently: Take 50,000 Units by mouth every  Friday.)   zinc sulfate 220 (50 Zn) MG capsule Take 1 capsule (220 mg total) by mouth daily.   No facility-administered encounter medications on file as of 02/10/2023.    ALLERGIES: Allergies  Allergen Reactions   Contrast Media [Iodinated Contrast Media] Other (See Comments)    Pt must be premedicated before given contrast media - stops heart   Iohexol Other (See Comments)     Consult with radiologist before pre meds are given.Desc: PT. STATES HEART STOPPED HAS TO BE PREMED.     VACCINATION STATUS: Immunization History  Administered Date(s) Administered   Influenza Inj Mdck Quad Pf 09/25/2018   Influenza Split 08/23/2014, 10/23/2015   Influenza,inj,Quad PF,6+ Mos 08/03/2016, 07/10/2019, 09/22/2020, 09/14/2021, 07/31/2022    Diabetes He presents for his follow-up diabetic visit. He has type 2 diabetes mellitus. Onset time: He was diagnosed at approximate age of 42 years. His disease course has been worsening. There are no hypoglycemic associated symptoms. Pertinent negatives for hypoglycemia include no confusion, headaches, pallor or seizures. Pertinent negatives for diabetes include no blurred vision, no chest pain, no fatigue, no polydipsia, no polyphagia, no polyuria and no weakness. There are no hypoglycemic complications. Symptoms are worsening. Diabetic complications include a CVA and heart disease. Risk factors for coronary artery disease include diabetes mellitus, dyslipidemia, hypertension, male sex, obesity, tobacco exposure, sedentary lifestyle and family history. Current diabetic treatments: He was supposed to be on basal insulin, unfortunately he ran out and could not afford refills and he did not call clinic for alternatives. He is compliant with treatment none of the time. His weight is fluctuating minimally. He is following  a generally unhealthy diet. When asked about meal planning, he reported none. He has not had a previous visit with a dietitian. He never participates in exercise. His home blood glucose trend is increasing steadily. His breakfast blood glucose range is generally >200 mg/dl. His lunch blood glucose range is generally >200 mg/dl. His dinner blood glucose range is generally >200 mg/dl. His bedtime blood glucose range is generally >200 mg/dl. His overall blood glucose range is >200 mg/dl. [name](Depaul presents with his CGM which shows significant disconnect in self-care.  He is not utilizing his CGM.  He is viewing only once on average for the last 90 days.  His point-of-care A1c is 8.8% increasing from 7.5% during his last visit.  He has only 16% time in range, 50% level 1 hyperglycemia, 34% level 2 hyperglycemia for the last 14 days.  His average blood glucose is 234 in this period of time.      ) An ACE inhibitor/angiotensin II receptor blocker is being taken. He does not see a podiatrist.Eye exam is not current.  Hyperlipidemia This is a chronic problem. The current episode started more than 1 year ago. The problem is uncontrolled. Recent lipid tests were reviewed and are high. Exacerbating diseases include diabetes and obesity. Pertinent negatives include no chest pain, myalgias or shortness of breath. Current antihyperlipidemic treatment includes statins. Compliance problems include medication cost and psychosocial issues.  Risk factors for coronary artery disease include dyslipidemia, diabetes mellitus, hypertension, male sex, obesity, family history and a sedentary lifestyle.  Hypertension This is a chronic problem. The current episode started more than 1 year ago. The problem is uncontrolled. Pertinent negatives include no blurred vision, chest pain, headaches, neck pain, palpitations or shortness of breath. Risk factors for coronary artery disease include diabetes mellitus, dyslipidemia, male gender,  obesity, family history, sedentary lifestyle and smoking/tobacco exposure. Past treatments include  ACE inhibitors. Hypertensive end-organ damage includes CVA.     Review of systems  Constitutional: + Minimally fluctuating body weight,  current  Body mass index is 44.88 kg/m. , no fatigue, no subjective hyperthermia, no subjective hypothermia    Objective:    BP 92/66   Pulse 88   Ht  (1.778 m)   Wt (!) 312 lb 12.8 oz (141.9 kg)   BMI 44.88 kg/m   Wt Readings from Last 3 Encounters:  02/10/23 (!) 312 lb 12.8 oz (141.9 kg)  02/07/23 (!) 313 lb 12.8 oz (142.3 kg)  12/29/22 (!) 314 lb (142.4 kg)      Physical Exam- Limited  Constitutional:  Body mass index is 44.88 kg/m. , not in acute distress, normal state of mind    CMP ( most recent) CMP     Component Value Date/Time   NA 140 11/03/2022 0951   K 3.7 11/03/2022 0951   CL 102 11/03/2022 0951   CO2 25 11/03/2022 0951   GLUCOSE 183 (H) 11/03/2022 0951   GLUCOSE 98 10/14/2022 0415   BUN 8 11/03/2022 0951   CREATININE 0.98 11/03/2022 0951   CALCIUM 9.3 11/03/2022 0951   PROT 5.4 (L) 11/03/2022 0951   ALBUMIN 3.5 (L) 11/03/2022 0951   AST 14 11/03/2022 0951   ALT 20 11/03/2022 0951   ALKPHOS 58 11/03/2022 0951   BILITOT 0.3 11/03/2022 0951   GFRNONAA >60 10/14/2022 0415   GFRAA 93 12/04/2020 0000   Diabetic Labs (most recent): Lab Results  Component Value Date   HGBA1C 8.8 (A) 02/10/2023   HGBA1C 7.5 (H) 10/12/2022   HGBA1C 8.9 (A) 08/09/2022   MICROALBUR 10 06/26/2020     Lipid Panel ( most recent) Lipid Panel     Component Value Date/Time   CHOL 122 04/26/2022 1353   TRIG 206 (H) 04/26/2022 1353   HDL 26 (L) 04/26/2022 1353   CHOLHDL 4.7 04/26/2022 1353   CHOLHDL 6.1 04/28/2014 0121   VLDL 61 (H) 04/28/2014 0121   LDLCALC 62 04/26/2022 1353     Assessment & Plan:   1. DM type 2 causing vascular disease (HCC)  - Patient has currently uncontrolled symptomatic type 2 DM since  62 years of  age.  [name] presents with his CGM which shows significant disconnect in self-care.  He is not utilizing his CGM.  He is viewing only once on average for the last 90 days.  His point-of-care A1c is 8.8% increasing from 7.5% during his last visit.  He has only 16% time in range, 50% level 1 hyperglycemia, 34% level 2 hyperglycemia for the last 14 days.  His average blood glucose is 234 in this period of time.    -his diabetes is complicated by coronary artery disease, CVA, obesity/sedentary life, chronic heavy smoking and [name] remains at extremely high risk  for more acute and chronic complications which include CAD, CVA, CKD, retinopathy, and neuropathy. These are all discussed in detail with the patient.  - I have counseled him on diet management and weight loss, by adopting a carbohydrate restricted/protein rich diet.  [name] is really struggling to connect the dots in his self-care.  Admittedly, he does not follow the routine recommended.  However he wishes to stay on the same regimen of insulin and Trulicity. - he acknowledges that there is a room for improvement in his food and drink choices. - Suggestion is made for him to avoid simple carbohydrates  from his diet including Cakes, Sweet  Desserts, Ice Cream, Soda (diet and regular), Sweet Tea, Candies, Chips, Cookies, Store Bought Juices, Alcohol in Excess of  1-2 drinks a day, Artificial Sweeteners,  Coffee Creamer, and "Sugar-free" Products, Lemonade. This will help patient to have more stable blood glucose profile and potentially avoid unintended weight gain. - I encouraged him to switch to  unprocessed or minimally processed complex starch and increased protein intake (animal or plant source), fruits, and vegetables. Whole food, plant-based diet was discussed in detail with him.  - he is advised to stick to a routine mealtimes to eat 3 meals  a day and avoid unnecessary snacks ( to snack only to correct hypoglycemia).   - I  have approached him with the following individualized plan to manage diabetes and patient agrees:   -He has social problems, not enough support.  Reportedly sleeps extended hours, could be missing some meals.  He is at risk of hypoglycemia.   In the interest of safety, he is advised to stay on his current dose of insulin,   Humulin RU 500  60 units  3 times a day for Premeal blood glucose readings above 90 mg per DL,  associated with strict monitoring of blood glucose 4 times a day-before meals and at bedtime, using his CGM continuously. -He is advised to use his meter as a backup when he has problem with his CGM.  - He does not tolerate metformin due to GI side effects.  -He is tolerating Trulicity.  He is advised to continue Trulicity 1.5 mg subcutaneously weekly.   -Patient is encouraged to call clinic for blood glucose levels less than 70 or above 200 mg /dl.   - Patient specific target  A1c;  LDL, HDL, Triglycerides,  were discussed in detail.  2) BP/HTN:  -His blood pressure is controlled to target.  He remains on his lisinopril 10 mg p.o. daily, metoprolol 100 mg p.o. twice daily, and advised to continue. The patient was counseled on the dangers of tobacco use, and was advised to quit.  Reviewed strategies to maximize success, including removing cigarettes and smoking materials from environment.   3) Lipids/HPL: He has had history of severe hypertriglyceridemia, recently improving to 230 from 469, mainly associated with improvement in his glycemic profile.  His LDL is on target at 62.  He is advised to continue Trilipix 135 mg p.o. nightly, continue Crestor 20 mg p.o. nightly,  fenofibrate 145 mg p.o. nightly.       4)  Weight/Diet: His BMI is 44.88--clearly complicating his diabetes care.  He is a candidate for modest weight loss.  CDE Consult has been  initiated , exercise, and detailed carbohydrates information provided.  5) vitamin D deficiency: He is on ongoing supplement with  vitamin D2 50,000 units weekly for 12 weeks.  6) Chronic Care/Health Maintenance:  -he  is on ACEI/ARB and Statin medications and  is encouraged to continue to follow up with Ophthalmology, Dentist,  Podiatrist at least yearly or according to recommendations, and advised to  quit smoking. I have recommended yearly flu vaccine and pneumonia vaccination at least every 5 years; moderate intensity exercise for up to 150 minutes weekly; and  sleep for at least 7 hours a day.   - I advised patient to maintain close follow up with Assunta Found, MD for primary care needs.  I spent  41  minutes in the care of the patient today including review of labs from CMP, Lipids, Thyroid Function, Hematology (current and previous including abstractions  from other facilities); face-to-face time discussing  his blood glucose readings/logs, discussing hypoglycemia and hyperglycemia episodes and symptoms, medications doses, his options of short and long term treatment based on the latest standards of care / guidelines;  discussion about incorporating lifestyle medicine;  and documenting the encounter. Risk reduction counseling performed per USPSTF guidelines to reduce  obesity and cardiovascular risk factors.     Please refer to Patient Instructions for Blood Glucose Monitoring and Insulin/Medications Dosing Guide"  in media tab for additional information. Please  also refer to " Patient Self Inventory" in the Media  tab for reviewed elements of pertinent patient history.  Christie Beckers participated in the discussions, expressed understanding, and voiced agreement with the above plans.  All questions were answered to his satisfaction. he is encouraged to contact clinic should he have any questions or concerns prior to his return visit.   Follow up plan: - Return in about 3 months (around 05/12/2023) for F/U with Pre-visit Labs, Meter/CGM/Logs, A1c here.  Marquis Lunch, MD Phone: [phone]  Fax: [phone]    02/10/2023, 8:01 PM   This note was partially dictated with voice recognition software. Similar sounding words can be transcribed inadequately or may not  be corrected upon review.

## 2023-02-10 NOTE — Patient Instructions (Signed)
                                     Advice for Weight Management  -For most of us the best way to lose weight is by diet management. Generally speaking, diet management means consuming less calories intentionally which over time brings about progressive weight loss.  This can be achieved more effectively by avoiding ultra processed carbohydrates, processed meats, unhealthy fats.    It is critically important to know your numbers: how much calorie you are consuming and how much calorie you need. More importantly, our carbohydrates sources should be unprocessed naturally occurring  complex starch food items.  It is always important to balance nutrition also by  appropriate intake of proteins (mainly plant-based), healthy fats/oils, plenty of fruits and vegetables.   -The American College of Lifestyle Medicine (ACL M) recommends nutrition derived mostly from Whole Food, Plant Predominant Sources example an apple instead of applesauce or apple pie. Eat Plenty of vegetables, Mushrooms, fruits, Legumes, Whole Grains, Nuts, seeds in lieu of processed meats, processed snacks/pastries red meat, poultry, eggs.  Use only water or unsweetened tea for hydration.  The College also recommends the need to stay away from risky substances including alcohol, smoking; obtaining 7-9 hours of restorative sleep, at least 150 minutes of moderate intensity exercise weekly, importance of healthy social connections, and being mindful of stress and seek help when it is overwhelming.    -Sticking to a routine mealtime to eat 3 meals a day and avoiding unnecessary snacks is shown to have a big role in weight control. Under normal circumstances, the only time we burn stored energy is when we are hungry, so allow  some hunger to take place- hunger means no food between appropriate meal times, only water.  It is not advisable to starve.   -It is better to avoid simple carbohydrates including:  Cakes, Sweet Desserts, Ice Cream, Soda (diet and regular), Sweet Tea, Candies, Chips, Cookies, Store Bought Juices, Alcohol in Excess of  1-2 drinks a day, Lemonade,  Artificial Sweeteners, Doughnuts, Coffee Creamers, "Sugar-free" Products, etc, etc.  This is not a complete list.....    -Consulting with certified diabetes educators is proven to provide you with the most accurate and current information on diet.  Also, you may be  interested in discussing diet options/exchanges , we can schedule a visit with [name], RDN, CDE for individualized nutrition education.  -Exercise: If you are able: 30 -60 minutes a day ,4 days a week, or 150 minutes of moderate intensity exercise weekly.    The longer the better if tolerated.  Combine stretch, strength, and aerobic activities.  If you were told in the past that you have high risk for cardiovascular diseases, or if you are currently symptomatic, you may seek evaluation by your heart doctor prior to initiating moderate to intense exercise programs.                                  Additional Care Considerations for Diabetes/Prediabetes   -Diabetes  is a chronic disease.  The most important care consideration is regular follow-up with your diabetes care provider with the goal being avoiding or delaying its complications and to take advantage of advances in medications and technology.  If appropriate actions are taken early enough, type 2 diabetes can even be   reversed.  Seek information from the right source.  - Whole Food, Plant Predominant Nutrition is highly recommended: Eat Plenty of vegetables, Mushrooms, fruits, Legumes, Whole Grains, Nuts, seeds in lieu of processed meats, processed snacks/pastries red meat, poultry, eggs as recommended by American College of  Lifestyle Medicine (ACLM).  -Type 2 diabetes is known to coexist with other important comorbidities such as high blood pressure and high cholesterol.  It is critical to control not only the  diabetes but also the high blood pressure and high cholesterol to minimize and delay the risk of complications including coronary artery disease, stroke, amputations, blindness, etc.  The good news is that this diet recommendation for type 2 diabetes is also very helpful for managing high cholesterol and high blood blood pressure.  - Studies showed that people with diabetes will benefit from a class of medications known as ACE inhibitors and statins.  Unless there are specific reasons not to be on these medications, the standard of care is to consider getting one from these groups of medications at an optimal doses.  These medications are generally considered safe and proven to help protect the heart and the kidneys.    - People with diabetes are encouraged to initiate and maintain regular follow-up with eye doctors, foot doctors, dentists , and if necessary heart and kidney doctors.     - It is highly recommended that people with diabetes quit smoking or stay away from smoking, and get yearly  flu vaccine and pneumonia vaccine at least every 5 years.  See above for additional recommendations on exercise, sleep, stress management , and healthy social connections.      

## 2023-02-16 ENCOUNTER — Other Ambulatory Visit: Payer: Self-pay

## 2023-02-16 DIAGNOSIS — E1159 Type 2 diabetes mellitus with other circulatory complications: Secondary | ICD-10-CM

## 2023-02-16 MED ORDER — HUMULIN R U-500 KWIKPEN 500 UNIT/ML ~~LOC~~ SOPN: 60.0000 [IU] | PEN_INJECTOR | Freq: Three times a day (TID) | SUBCUTANEOUS | 1 refills | Status: DC

## 2023-02-20 DIAGNOSIS — E1165 Type 2 diabetes mellitus with hyperglycemia: Secondary | ICD-10-CM | POA: Diagnosis not present

## 2023-02-21 DIAGNOSIS — J449 Chronic obstructive pulmonary disease, unspecified: Secondary | ICD-10-CM | POA: Diagnosis not present

## 2023-02-21 DIAGNOSIS — G4733 Obstructive sleep apnea (adult) (pediatric): Secondary | ICD-10-CM | POA: Diagnosis not present

## 2023-02-22 ENCOUNTER — Telehealth: Payer: Self-pay | Admitting: Nurse Practitioner

## 2023-02-22 DIAGNOSIS — K219 Gastro-esophageal reflux disease without esophagitis: Secondary | ICD-10-CM | POA: Diagnosis not present

## 2023-02-22 DIAGNOSIS — R070 Pain in throat: Secondary | ICD-10-CM | POA: Diagnosis not present

## 2023-02-22 NOTE — Telephone Encounter (Signed)
Patient was ask to call [employer] at [phone]. To see if he is still enrolled and if they might could tell him why he has not heard anything from them or received insulin. The last paper work that is seen is from July 2023.

## 2023-02-22 NOTE — Telephone Encounter (Signed)
Talked with the patient. He says that [facility] is the one who ask him to call our office to check on this. He says that he has not received anything from patient assistance in a while, he has 1 box left of his insulin. He would like to be on the patient assistance.

## 2023-02-22 NOTE — Telephone Encounter (Signed)
Pt left a VM stating he got a call from the pharmacy about picking up his Humlin. He states he gets pt assistance. He is asking for a call back

## 2023-03-01 DIAGNOSIS — I10 Essential (primary) hypertension: Secondary | ICD-10-CM | POA: Diagnosis not present

## 2023-03-01 DIAGNOSIS — E1165 Type 2 diabetes mellitus with hyperglycemia: Secondary | ICD-10-CM | POA: Diagnosis not present

## 2023-03-01 DIAGNOSIS — J449 Chronic obstructive pulmonary disease, unspecified: Secondary | ICD-10-CM | POA: Diagnosis not present

## 2023-03-05 DIAGNOSIS — J449 Chronic obstructive pulmonary disease, unspecified: Secondary | ICD-10-CM | POA: Diagnosis not present

## 2023-03-09 ENCOUNTER — Other Ambulatory Visit: Payer: Self-pay | Admitting: Gastroenterology

## 2023-03-09 NOTE — Telephone Encounter (Signed)
I have refilled but needs office visit. Any APP. Thanks!

## 2023-03-11 DIAGNOSIS — J449 Chronic obstructive pulmonary disease, unspecified: Secondary | ICD-10-CM | POA: Diagnosis not present

## 2023-03-11 DIAGNOSIS — R0602 Shortness of breath: Secondary | ICD-10-CM | POA: Diagnosis not present

## 2023-03-14 ENCOUNTER — Telehealth: Payer: Self-pay | Admitting: Nurse Practitioner

## 2023-03-14 NOTE — Telephone Encounter (Signed)
Left a message with [employer] [facility] for pt's Rx of Humulin R U-500 60 units tid ac meals, 4 month supply  with 3 refills with maximum daily dose of 180 units.

## 2023-03-14 NOTE — Telephone Encounter (Signed)
Pt received pt assistance for Trulicity but has not heard about the Humolin, pt stated [employer] said someone could just call to add to his pt assistance.

## 2023-03-15 ENCOUNTER — Ambulatory Visit: Payer: Medicare HMO | Admitting: Pulmonary Disease

## 2023-03-15 ENCOUNTER — Telehealth: Payer: Self-pay | Admitting: "Endocrinology

## 2023-03-15 DIAGNOSIS — E1159 Type 2 diabetes mellitus with other circulatory complications: Secondary | ICD-10-CM

## 2023-03-15 DIAGNOSIS — E538 Deficiency of other specified B group vitamins: Secondary | ICD-10-CM | POA: Diagnosis not present

## 2023-03-15 MED ORDER — FREESTYLE LIBRE 2 SENSOR MISC: 1.0000 | 2 refills | Status: DC

## 2023-03-15 NOTE — Telephone Encounter (Signed)
Sent to [facility]

## 2023-03-15 NOTE — Telephone Encounter (Signed)
Pt states he only has 1 libre sensor left and needs a new prescription sent into wherever they were sent in last time that sent them to his house.

## 2023-03-22 DIAGNOSIS — R944 Abnormal results of kidney function studies: Secondary | ICD-10-CM | POA: Diagnosis not present

## 2023-03-22 DIAGNOSIS — I480 Paroxysmal atrial fibrillation: Secondary | ICD-10-CM | POA: Diagnosis not present

## 2023-03-22 DIAGNOSIS — R079 Chest pain, unspecified: Secondary | ICD-10-CM | POA: Diagnosis not present

## 2023-03-22 DIAGNOSIS — D72829 Elevated white blood cell count, unspecified: Secondary | ICD-10-CM | POA: Diagnosis not present

## 2023-03-22 DIAGNOSIS — I25708 Atherosclerosis of coronary artery bypass graft(s), unspecified, with other forms of angina pectoris: Secondary | ICD-10-CM | POA: Diagnosis not present

## 2023-03-22 DIAGNOSIS — I10 Essential (primary) hypertension: Secondary | ICD-10-CM | POA: Diagnosis not present

## 2023-03-22 DIAGNOSIS — R0602 Shortness of breath: Secondary | ICD-10-CM | POA: Diagnosis not present

## 2023-03-22 DIAGNOSIS — E1165 Type 2 diabetes mellitus with hyperglycemia: Secondary | ICD-10-CM | POA: Diagnosis not present

## 2023-03-23 DIAGNOSIS — G4733 Obstructive sleep apnea (adult) (pediatric): Secondary | ICD-10-CM | POA: Diagnosis not present

## 2023-03-23 DIAGNOSIS — J449 Chronic obstructive pulmonary disease, unspecified: Secondary | ICD-10-CM | POA: Diagnosis not present

## 2023-03-23 NOTE — Progress Notes (Unsigned)
GI Office Note    Referring Provider: Assunta Found, MD Primary Care Physician:  Assunta Found, MD Primary Gastroenterologist: Hennie Duos. [name], DO  Date:  03/24/2023  ID:  Christie Beckers, DOB [birth year], MRN [id number]   Chief Complaint   Chief Complaint  Patient presents with   Follow-up    Pt following up for refills   History of Present Illness  DENARI MANGLICMOT is a 62 y.o. male with a history of GERD, barrett's esophagus, Mi in 2008/2009 s/p CABG in 2008, COPD, Afib on eliquis, HTN, HLD, OSA on CPAP, and stroke in 2017 presenting today for visit to obtain medication refills.   EGD 2019: -normal esophagus s/p dilation -mild gastritis -repeat in 5 years  Last office visit 09/15/21. Any abdominal pain, nausea, vomiting, GI bleed with cardiology and pulmonology.  Stated his breathing felt worse, he continues to wear CPAP at night.  Was due for surveillance therefore he was scheduled after obtaining clearance from cardiology and pulmonology.  Colonoscopy 10/12/21: - Preparation of the colon was fair. - Non-bleeding internal hemorrhoids. - One 2 mm polyp in the transverse colon - Three 4 to 6 mm polyps in the descending colon. - The examination was otherwise normal. - Repeat in 5 years.   Seen pulmonology 02/07/23 noting breathing to be up and down.  Reportedly unchanged.  Smokes half pack per day.  Was treated in February 2024 for COPD exacerbation with doxycycline and prednisone.  Chest x-ray with increased interstitial markings perihilar regions and clearing of bilateral pleural effusions.  Noted to have OSA and using CPAP. CT chest without con 02/04/23 patchy right upper lobe infiltrate, nodular, 2.2 x 1.4 cm slightly increased other nodules stable.  Patient s/p SBRT with nodular infiltrate in the right lobe which appears to be slightly increased in the interval from last scan.  Feels as though this was radiation changes but will obtain 92-month follow-up CT.  O2 87% on  walking into the exam room.  Advised 3 L POC with ambulation and 2 L continuous blended into CPAP at night.  Trelegy refilled.  Seen by cardiology 03/22/2023 with [facility].  Noted to have paroxysmal A-fib on Eliquis, morbid obesity, CAD.  Plan to continue Eliquis as well as oxygen therapy and follow-up with pulmonology.  Will obtain blood work.  Does not appear any changes to hypertensive regimen was made.  Continued on amlodipine 10 mg daily and Lasix 40 mg as needed.  Noted by him metoprolol 100 mg twice daily.  Today:  Yesterday BP was 88/56. Wears oxygen at night with his CPAP.  Wears some oxygen during the day at home as well when sitting in his chair. Short of breath at baseline from his COPD. States he has had some lung nodules as well - did one round of radiation therapy for the nodule.   States at times at night he has some leakage from his rectum about 1-2 times per week. He thinks some of his medication causes him to have some diarrhea.   Reflux is better and well controlled on his medication as long as he takes it. Does have some heaving at times and sometimes it is hard to swallow. Reports he seen ENT and had a light ran down and told he had a little swelling in his throat and was placed on valium. That has helped the dysphagia/choking sensation and difficulty breathing.   Has rare toilet tissue hematochezia.   Currently denies any dizziness, lightheadedness, syncope, or presyncope.  Denies  any chest pain.  As noted above he has shortness of breath at baseline, not worsened.  Current Outpatient Medications  Medication Sig Dispense Refill   albuterol (PROVENTIL) (2.5 MG/3ML) 0.083% nebulizer solution Take 3 mLs (2.5 mg total) by nebulization every 6 (six) hours as needed for wheezing or shortness of breath. 75 mL 12   albuterol (VENTOLIN HFA) 108 (90 Base) MCG/ACT inhaler INHALE TWO PUFFS BY MOUTH INTO LUNGS EVERY 6 HOURS AS NEEDED FOR WHEEZING AND/OR SHORTNESS OF BREATH 8.5 g 3    apixaban (ELIQUIS) 5 MG TABS tablet Take 1 tablet (5 mg total) by mouth 2 (two) times daily. 60 tablet 1   ascorbic acid (VITAMIN C) 500 MG tablet Take 1 tablet (500 mg total) by mouth daily. 30 tablet 0   B-D ULTRAFINE III SHORT PEN 31G X 8 MM MISC USE AS DIRECTED TWICE DAILY 100 each 4   blood glucose meter kit and supplies Dispense based on patient and insurance preference. Use up to two times daily as directed. (FOR ICD-E11.65) 1 each 5   Blood Glucose Monitoring Suppl (ONETOUCH VERIO) w/Device KIT 1 each by Does not apply route as needed. 1 kit 0   Cyanocobalamin (VITAMIN B-12 IJ) Inject 1,000 mcg as directed every 30 (thirty) days.     diazepam (VALIUM) 5 MG tablet Take 5 mg by mouth 2 (two) times daily.     escitalopram (LEXAPRO) 20 MG tablet Take 20 mg by mouth in the morning.     fenofibrate (TRICOR) 145 MG tablet Take 145 mg by mouth in the morning.     Fluticasone-Umeclidin-Vilant (TRELEGY ELLIPTA) 100-62.5-25 MCG/ACT AEPB Inhale 1 puff into the lungs daily. 60 each 2   furosemide (LASIX) 80 MG tablet Take 80 mg by mouth in the morning.  4   gabapentin (NEURONTIN) 300 MG capsule Take 300 mg by mouth 3 (three) times daily.     insulin regular human CONCENTRATED (HUMULIN R U-500 KWIKPEN) 500 UNIT/ML KwikPen Inject 60 Units into the skin 3 (three) times daily with meals. Only when blood glucose is above 90 and eating. 12 mL 1   isosorbide mononitrate (IMDUR) 120 MG 24 hr tablet Take 120 mg by mouth in the morning.     Lancets (ONETOUCH DELICA PLUS LANCET33G) MISC USE TO check blood sugar FOUR TIMES DAILY AS DIRECTED 200 each 1   lisinopril (ZESTRIL) 10 MG tablet Take 1 tablet (10 mg total) by mouth daily. 90 tablet 1   meclizine (ANTIVERT) 25 MG tablet Take 25 mg by mouth every 6 (six) hours as needed for dizziness or nausea.     metoprolol tartrate (LOPRESSOR) 100 MG tablet Take 1 tablet (100 mg total) by mouth 2 (two) times daily. OFFICE VISIT NEEDED BEFORE ADDITIONAL REFILLS 28 tablet 0    modafinil (PROVIGIL) 200 MG tablet Take 200 mg by mouth 2 (two) times daily.     nitroGLYCERIN (NITROLINGUAL) 0.4 MG/SPRAY spray Place 1 spray under the tongue every 5 (five) minutes x 3 doses as needed for chest pain. 12 g 0   ONETOUCH VERIO test strip USE TO check blood sugar FOUR TIMES DAILY AS DIRECTED 200 strip 1   potassium chloride SA (KLOR-CON M) 20 MEQ tablet Take 1 tablet (20 mEq total) by mouth daily. While Taking Lasix/Furosemide 30 tablet 0   rosuvastatin (CRESTOR) 20 MG tablet Take 20 mg by mouth in the morning.     senna-docusate (SENOKOT-S) 8.6-50 MG tablet Take 2 tablets by mouth at bedtime. 60 tablet 1  Vitamin D, Ergocalciferol, (DRISDOL) 1.25 MG (50000 UNIT) CAPS capsule Take 1 capsule (50,000 Units total) by mouth every 7 (seven) days. (Patient taking differently: Take 50,000 Units by mouth every Friday.) 12 capsule 0   zinc sulfate 220 (50 Zn) MG capsule Take 1 capsule (220 mg total) by mouth daily. 30 capsule 0   pantoprazole (PROTONIX) 40 MG tablet Take 1 tablet (40 mg total) by mouth 2 (two) times daily before a meal. 180 tablet 3   No current facility-administered medications for this visit.    Past Medical History:  Diagnosis Date   Abnormal myocardial perfusion study 01/01/2011   there a small to moderate sized inferobasal scar   Barrett's esophagus    Cataracts, bilateral    Chronic kidney disease    hx of kidney stones   Claudication (HCC) 11/16/2011   PV test perform shows normal   COPD (chronic obstructive pulmonary disease) (HCC)    Depression    Diabetes (HCC)    type 2 diabetes mellitus   Dysrhythmia    GERD (gastroesophageal reflux disease)    Glaucoma    Hernia of abdominal wall    History of radiation therapy    Right lung, SBRT- 04/16/21-04/30/21- Dr. Antony Blackbird   HTN (hypertension)    Hyperlipidemia    Morbid obesity (HCC)    Myocardial infarction (HCC) 2008,2009,2009   OSA (obstructive sleep apnea)    on cpap   PAF (paroxysmal  atrial fibrillation) (HCC)    S/P colonoscopy 2009   3-4 mm transverse colon erosions likely secondary to  ASA   S/P endoscopy 10/2010   moderate erosive gastritis, Barrett's esophagus 1-2cm   Sleep apnea    SOB (shortness of breath) 11/03/2007   2D Echo EF 50%-55%   Stroke (HCC) 2017    Past Surgical History:  Procedure Laterality Date   BIOPSY N/A 04/29/2015   Procedure: BIOPSY;  Surgeon: West Bali, MD;  Location: [facility];  Service: Endoscopy;  Laterality: N/A;   BIOPSY  08/01/2018   Procedure: BIOPSY;  Surgeon: West Bali, MD;  Location: [facility];  Service: Endoscopy;;  esophageal   BIOPSY  10/12/2021   Procedure: BIOPSY;  Surgeon: Lanelle Bal, DO;  Location: [facility];  Service: Endoscopy;;   CABG X 4  03/2008   CARDIAC CATHETERIZATION  2009   stent placement to the left circumflex a 2.25    CARDIAC CATHETERIZATION  07/08/2010   CARDIAC CATHETERIZATION N/A 11/02/2019   COLONOSCOPY WITH PROPOFOL N/A 09/27/2017   normal ileum, twenty 4 to 8 mm polyps in the sigmoid colon, descending colon, splenic flexure, transverse colon, ascending colon, cecum.  An additional three 2 to 4 mm polyps in the rectum and the descending colon.  Diverticulosis, internal hemorrhoids.  Surgical pathology found the polyps to be one fragment of hyperplastic polyp and 22 fragments of tubular adenoma..  Recommended repeat colonoscop   COLONOSCOPY WITH PROPOFOL N/A 10/12/2021   Procedure: COLONOSCOPY WITH PROPOFOL;  Surgeon: Lanelle Bal, DO;  [facility]: [facility];  Service: Endoscopy;  Laterality: N/A;  1:30pm   CORONARY ARTERY BYPASS GRAFT  2008   4 vessels   CORONARY STENT PLACEMENT  12/29/12   CORONARY STENT PLACEMENT  12/2012   ESOPHAGEAL DILATION N/A 04/29/2015   Procedure: ESOPHAGEAL DILATION 15 mm, 16 mm;  Surgeon: West Bali, MD;  Location: [facility];  Service: Endoscopy;  Laterality: N/A;   ESOPHAGOGASTRODUODENOSCOPY  09/2011   Barrett's esophagus, no dysplasia on  biopsies. Distal esophagitis. Status post dilation. Moderate gastritis and duodenitis, but biopsies benign. Next EGD in November 2015 for surveillance of Barrett's esophagus.   ESOPHAGOGASTRODUODENOSCOPY (EGD) WITH PROPOFOL N/A 04/29/2015   SLF: 1. Barretts esophagus 2. Moderate non-erosive gastritis.    ESOPHAGOGASTRODUODENOSCOPY (EGD) WITH PROPOFOL N/A 08/01/2018   Barrett's, repeat in 5 years. Empiric dilatation, mild gastritis   HERNIA REPAIR     ventral hernia repair   LEFT HEART CATHETERIZATION WITH CORONARY/GRAFT ANGIOGRAM N/A 12/29/2012   Procedure: LEFT HEART CATHETERIZATION WITH Isabel Caprice;  Surgeon: Lennette Bihari, MD;  Location: [facility] CATH LAB;  Service: Cardiovascular;  Laterality: N/A;   ORIF FIBULA FRACTURE Right 06/03/2015   Procedure: OPEN REDUCTION INTERNAL FIXATION (ORIF) DISTAL FIBULA  FRACTURE;  Surgeon: Valeria Batman, MD;  Location: [facility];  Service: Orthopedics;  Laterality: Right;   PERCUTANEOUS CORONARY STENT INTERVENTION (PCI-S)  12/29/2012   Procedure: PERCUTANEOUS CORONARY STENT INTERVENTION (PCI-S);  Surgeon: Lennette Bihari, MD;  Location[facility] C[facility] LAB;  Service: Cardiovascular;;   POLYPECTOMY  09/27/2017   Procedure: POLYPECTOMY;  Surgeon: West Bali, MD;  [facility];  Service: Endoscopy;;  cecal polyp, ascending polyps x6, transverse colon polyps x6, splenic flexure polyps x2, descending colon polyps x6, sigmoid  colon polyp x1, rectal polyp x1    POLYPECTOMY  10/12/2021   Procedure: POLYPECTOMY;  Surgeon: Lanelle Bal, DO;  [facility];  Service: Endoscopy;;   SAVORY DILATION  09/06/2011   Procedure: SAVORY DILATION;  Surgeon: Arlyce Harman, MD;  Location: [facility];  Service: Endoscopy;  Laterality: N/A;  Dilated with 15mm   SAVORY DILATION N/A 08/01/2018   Procedure: SAVORY DILATION;  Surgeon: West Bali, MD;  Location: [facility];  Service: Endoscopy;  Laterality: N/A;    Family History  Problem Relation Age of  Onset   Diabetes Mother    Heart disease Mother    Diabetes Father    Heart disease Father    Pancreatic cancer Brother        age 63, doing well   Colon cancer Neg Hx    Anesthesia problems Neg Hx    Hypotension Neg Hx    Malignant hyperthermia Neg Hx    Pseudochol deficiency Neg Hx     Allergies as of 03/24/2023 - Review Complete 03/24/2023  Allergen Reaction Noted   Contrast media [iodinated contrast media] Other (See Comments) 03/14/2013   Iohexol Other (See Comments) 01/02/2008    Social History   Socioeconomic History   Marital status: Divorced    Spouse name: Not on file   Number of children: Not on file   Years of education: Not on file   Highest education level: Not on file  Occupational History   Not on file  Tobacco Use   Smoking status: Every Day    Packs/day: 0.50    Years: 30.00    Additional pack years: 0.00    Total pack years: 15.00    Types: Cigarettes   Smokeless tobacco: Never   Tobacco comments:    Smokes 0.25 packs per day [facility], 12/29/22  Vaping Use   Vaping Use: Never used  Substance and Sexual Activity   Alcohol use: Yes    Comment: occ beer   Drug use: No   Sexual activity: Never  Other Topics Concern   Not on file  Social History Narrative   Not on file   Social Determinants of Health   Financial Resource Strain: High Risk (  08/23/2022)   Overall Financial Resource Strain (CARDIA)    Difficulty of Paying Living Expenses: Very hard  Food Insecurity: No Food Insecurity (10/13/2022)   Hunger Vital Sign    Worried About Running Out of Food in the Last Year: Never true    Ran Out of Food in the Last Year: Never true  Recent Concern: Food Insecurity - Food Insecurity Present (08/23/2022)   Hunger Vital Sign    Worried About Running Out of Food in the Last Year: Often true    Ran Out of Food in the Last Year: Often true  Transportation Needs: No Transportation Needs (10/13/2022)   PRAPARE - Administrator, Civil Service  (Medical): No    Lack of Transportation (Non-Medical): No  Physical Activity: Not on file  Stress: Not on file  Social Connections: Not on file     Review of Systems   Gen: Denies fever, chills, anorexia. Denies fatigue, weakness, weight loss.  CV: Denies chest pain, palpitations, syncope, peripheral edema, and claudication. Resp: Denies dyspnea at rest, cough, wheezing, coughing up blood, and pleurisy. GI: See HPI Derm: Denies rash, itching, dry skin Psych: Denies depression, anxiety, memory loss, confusion. No homicidal or suicidal ideation.  Heme: Denies bruising, bleeding, and enlarged lymph nodes.   Physical Exam   BP (!) 89/60   Pulse 97   Temp 97.8 F (36.6 C)   Ht 5' 10.5" (1.791 m)   Wt (!) 319 lb 3.2 oz (144.8 kg)   SpO2 94%   BMI 45.15 kg/m   General:   Alert and oriented. No distress noted. Pleasant and cooperative.  Head:  Normocephalic and atraumatic. Eyes:  Conjuctiva clear without scleral icterus. Mouth:  Oral mucosa pink and moist. Good dentition. No lesions. Lungs:  Clear to auscultation bilaterally. Expiratory wheezing bilaterally. Shallow breaths. Mildly tachypnea.   Heart:  S1, S2 present without murmurs appreciated.  Abdomen:  +BS, soft, rounded, small mid right-sided hernia without tenderness.  No rebound or guarding. No HSM or masses noted. Rectal: deferred Msk:  Symmetrical without gross deformities. Normal posture.  Unsteady gait, uses single-point cane Neurologic:  Alert and  oriented x4 Psych:  Alert and cooperative. Normal mood and affect.   Assessment  [name] is a 62 y.o. male with a history of GERD, barrett's esophagus, Mi in 2008/2009, COPD, Afib on eliquis, HTN, HLD, OSA on CPAP, and stroke in 2017 presenting today for visit to obtain medication refills.  GERD, Barrett's esophagus, dysphagia: GERD well-controlled on pantoprazole 40 mg twice daily.  Has been experiencing some difficulty with swallowing solids and liquids over  the last few months.  Stated he has seen ENT already underwent direct localization of the airways and was placed on Valium due to concern of some swelling in his airway which has somewhat improved his symptoms but he continues to have some issues.  Last EGD in 2019 with Barrett's esophagus and required dilation for dysphagia.  Ultimately due in October for surveillance EGD of Barrett's however given he is experiencing dysphagia with appointment scheduled after obtaining clearance from pulmonology and cardiology.  Mild fecal leakage/soiling: No overt constipation or diarrhea however does report that he has had some mild loose leakage at night that occurs 1-2 times per week and is usually very small amount.  He feels as though some of his looser stools are secondary to his medications and diabetes.  For now advised him to start a fiber supplement 3-4 times a week to help bulk  up the stools to help with this leakage.  Rectal exam deferred today however have a history of hemorrhoids which could be contributing to this issue.  This is also likely the cause of his rare toilet tissue hematochezia.   Presents with hypotension but is overall asymptomatic.  He states his blood pressure was like this as well yesterday but just saw cardiology 2 days ago with normal blood pressure.  I have asked him to not take any more blood pressure medication today until he contact his cardiologist to let him know about his readings from last 2 days as he may need a medication adjustment.  PLAN   Proceed with upper endoscopy + dilation with propofol by Dr. [name] in near future: the risks, benefits, and alternatives have been discussed with the patient in detail. The patient states understanding and desires to proceed. ASA 4 (EF 40-45%) Clearance from pulmonology and cardiology Also need clearance to hold Eliquis for 2 days. No insulin morning of procedure  Continue pantoprazole 40 mg BID Discuss hypotension with Cardiology, may  need medications adjusted.  Fiber supplement 3-4 times weekly or high fiber diet for loose stools Follow up in 4 months.    Brooke Bonito, MSN, FNP-BC, AGACNP-BC [facility]

## 2023-03-24 ENCOUNTER — Telehealth: Payer: Self-pay | Admitting: *Deleted

## 2023-03-24 ENCOUNTER — Telehealth: Payer: Self-pay | Admitting: "Endocrinology

## 2023-03-24 ENCOUNTER — Other Ambulatory Visit: Payer: Self-pay

## 2023-03-24 ENCOUNTER — Encounter: Payer: Self-pay | Admitting: Gastroenterology

## 2023-03-24 ENCOUNTER — Ambulatory Visit: Payer: Medicare HMO | Admitting: Gastroenterology

## 2023-03-24 VITALS — BP 89/60 | HR 97 | Temp 97.8°F | Ht 70.5 in | Wt 319.2 lb

## 2023-03-24 DIAGNOSIS — K219 Gastro-esophageal reflux disease without esophagitis: Secondary | ICD-10-CM | POA: Diagnosis not present

## 2023-03-24 DIAGNOSIS — I959 Hypotension, unspecified: Secondary | ICD-10-CM

## 2023-03-24 DIAGNOSIS — K2270 Barrett's esophagus without dysplasia: Secondary | ICD-10-CM

## 2023-03-24 DIAGNOSIS — R151 Fecal smearing: Secondary | ICD-10-CM

## 2023-03-24 DIAGNOSIS — E1159 Type 2 diabetes mellitus with other circulatory complications: Secondary | ICD-10-CM

## 2023-03-24 MED ORDER — PANTOPRAZOLE SODIUM 40 MG PO TBEC: 40.0000 mg | DELAYED_RELEASE_TABLET | Freq: Two times a day (BID) | ORAL | 3 refills | Status: DC

## 2023-03-24 MED ORDER — INSULIN LISPRO PROT & LISPRO (75-25 MIX) 100 UNIT/ML KWIKPEN: 60.0000 [IU] | PEN_INJECTOR | Freq: Two times a day (BID) | SUBCUTANEOUS | 0 refills | Status: DC

## 2023-03-24 NOTE — Telephone Encounter (Signed)
Pt has run out of [place] 2 sensors and wants to know if there is a company he can get them from cheaper than $27 a month.

## 2023-03-24 NOTE — Telephone Encounter (Signed)
FYI  Tried 3 more times to get a pharmacist on the phone from [place] [facility] pt assistance so Dr Fransico Him can verify dosage of U-500. Left 3 messages.   [phone]

## 2023-03-24 NOTE — Telephone Encounter (Signed)
Pt told me the sensors were $27 each. Can you try to send this to [employer]

## 2023-03-24 NOTE — Telephone Encounter (Signed)
Patient needing Pulmonary clearance.  What type of surgery is being performed?  EGD/esophageal dilation  When is surgery scheduled? TBD  Name of physician performing surgery?  Dr. Earnest Bailey [facility] at [facility] Phone: [phone] Fax: [phone]  Anethesia type (none, local, MAC, general)? MAC

## 2023-03-24 NOTE — Telephone Encounter (Signed)
  Request for patient to stop medication prior to procedure or is needing cleareance  03/24/23  [name] 1961-01-03  What type of surgery is being performed? EGD/Esophageal dilation  When is surgery scheduled? TBD  What type of clearance is required (medical or pharmacy to hold medication or both? both  Are there any medications that need to be held prior to surgery and how long? Eliquis x 2 days prior  Name of physician performing surgery?  Dr. Earnest Bailey [facility] at [facility] Phone: [phone] Fax: [phone]  Anethesia type (none, local, MAC, general)? MAC

## 2023-03-24 NOTE — Telephone Encounter (Signed)
   Patient Name: [name]  DOB: [birth year] MRN: [id number]  Primary Cardiologist: None  Chart reviewed as part of pre-operative protocol coverage.   Please advise that is established with [facility] cardiology and is not a patient currently followed by a cardiologist at [facility] heart care  Napoleon Form, Leodis Rains, NP 03/24/2023, 3:21 PM

## 2023-03-24 NOTE — Patient Instructions (Addendum)
I am attaching some education for you regarding a high-fiber diet.  You can try increasing her intake of these types of foods or obtain a fiber supplement 3-4 times weekly to help you with some of the looser stools that cause you to have some leakage at night.  Sometimes this leakage occurs due to hemorrhoids therefore if we bulk up your stools this should help reduce the frequency.  I have refilled her pantoprazole.  You should continue to take 40 mg twice daily, 30 minutes prior to breakfast and dinner.  We are going to schedule you for an upper endoscopy with possible dilation with Dr. [name] to assess your swallowing and surveillance for Barrett's esophagus.  We will need to obtain clearance from cardiology and pulmonology as well as approval to hold her Eliquis for 2 days prior to getting you scheduled.  Please contact your cardiologist and let him know about your blood pressures as they may want to reduce some of your medications!!  I would recommend that you don't take any of your other blood pressure medications today until you speak with them. If your blood pressure becomes much lower I would like for you to go to the ED for evaluation.  It was a pleasure to see you today. I want to create trusting relationships with patients. If you receive a survey regarding your visit,  I greatly appreciate you taking time to fill this out on paper or through your MyChart. I value your feedback.  Brooke Bonito, MSN, FNP-BC, AGACNP-BC [facility]

## 2023-03-24 NOTE — Telephone Encounter (Signed)
Sent order for [place] 2 sensors to [employer].

## 2023-03-24 NOTE — Telephone Encounter (Signed)
Spoke with the representative at [facility] for pts Humulin R U-500. It was approved but the pharmacist at [facility] needs to speak with Dr Fransico Him concerning the U-500. Once they speak with him to verify dosage it can be shipped. The [employer] representative tried to get a pharmacist on the phone to speak with Dr Fransico Him. It went straight to VM. Left msg for them to call back. They will also send the pharmacist a msg to call us back and speak with Dr Fransico Him. The pts trulicity has already been shipped.   [facility] [phone].

## 2023-03-24 NOTE — Telephone Encounter (Signed)
Pt is out of insulin u500. Per Dr Fransico Him Humalog 75/25 can be sent in temporarily 60 units bid until we can get a call back from the pharmacist at [facility] patient assistance to speak with Dr Fransico Him.

## 2023-03-25 NOTE — Telephone Encounter (Signed)
Spoke with pharmacist giving her approval and verification of dosing for Humulin R U-500 per Dr.Nida's orders.

## 2023-03-25 NOTE — Telephone Encounter (Signed)
Note faxed to [facility]

## 2023-04-01 DIAGNOSIS — E782 Mixed hyperlipidemia: Secondary | ICD-10-CM | POA: Diagnosis not present

## 2023-04-01 DIAGNOSIS — I10 Essential (primary) hypertension: Secondary | ICD-10-CM | POA: Diagnosis not present

## 2023-04-01 DIAGNOSIS — J449 Chronic obstructive pulmonary disease, unspecified: Secondary | ICD-10-CM | POA: Diagnosis not present

## 2023-04-01 DIAGNOSIS — E1165 Type 2 diabetes mellitus with hyperglycemia: Secondary | ICD-10-CM | POA: Diagnosis not present

## 2023-04-01 NOTE — Telephone Encounter (Signed)
Clearance received. See scanned in media. thanks

## 2023-04-01 NOTE — Telephone Encounter (Signed)
LMOVM to call back to schedule EGD/ED with Dr. [name] ASA 13m stop eliquis x 2 days prior, no insulin morning of procedure

## 2023-04-01 NOTE — Telephone Encounter (Signed)
Spoke with pt. He has been scheduled for 6/20. Aware will need pre-op appt prior. Will call back with appt. Advised will mail instructions. Also advised to stop eliquis 2 days prior. He voiced understanding.

## 2023-04-04 MED ORDER — FREESTYLE LIBRE 2 SENSOR MISC
0 refills | Status: DC
Start: 2023-04-04 — End: 2023-09-28

## 2023-04-04 NOTE — Telephone Encounter (Signed)
Pt aware of pre-op appt details 

## 2023-04-04 NOTE — Telephone Encounter (Signed)
Pt has not received any sensors yet. Please Advise

## 2023-04-04 NOTE — Telephone Encounter (Signed)
Pt refused libre 2 sensors from [employer]. Stated the company he had received sensors from previously did not charge him a copay. Pt record shows he has received supplies from [employer]. Will send a Rx for sensors to [employer].

## 2023-04-04 NOTE — Telephone Encounter (Signed)
LMOVM to call back to give pre-op appt and to see if he can pick up instructions at office

## 2023-04-04 NOTE — Addendum Note (Signed)
Addended by: Derrell Lolling on: 04/04/2023 04:04 PM   Modules accepted: Orders

## 2023-04-05 DIAGNOSIS — J449 Chronic obstructive pulmonary disease, unspecified: Secondary | ICD-10-CM | POA: Diagnosis not present

## 2023-04-13 ENCOUNTER — Telehealth: Payer: Self-pay | Admitting: *Deleted

## 2023-04-13 NOTE — Telephone Encounter (Signed)
Pt called back. Informed pt do not see where anyone from office has tried to contact him. Pt verbalized understanding.

## 2023-04-13 NOTE — Telephone Encounter (Signed)
Pt left vm stating he was returning a call.  LMOVM stating that do not see where anyone has tried to call him. His procedure has been scheduled. If any questions, to call back.

## 2023-04-14 NOTE — Telephone Encounter (Signed)
Pt called and left a VM stating he is out of [employer] and would like a cal back

## 2023-04-14 NOTE — Telephone Encounter (Signed)
Spoke to pt, advised him we had sent in a Rx for freestyle libre 2 sensors to [facility] on 04/04/23. Advised pt to contact [facility] to insure they have all his needed information. Pt voiced understanding.

## 2023-04-15 DIAGNOSIS — E538 Deficiency of other specified B group vitamins: Secondary | ICD-10-CM | POA: Diagnosis not present

## 2023-04-18 NOTE — Patient Instructions (Signed)
[name]  04/18/2023     @PREFPERIOPPHARMACY @   Your procedure is scheduled on  04/21/2023.   Report to [facility] at   0600 A.M.   Call this number if you have problems the morning of surgery:  [phone]  If you experience any cold or flu symptoms such as cough, fever, chills, shortness of breath, etc. between now and your scheduled surgery, please notify us at the above number.   Remember:  Follow the diet instructions given to you by the office.          Use your nebulizer and your inhaler before you come and bring your rescue inhaler with you.    Take these medicines the morning of surgery with A SIP OF WATER         diazepam, escitalopram, gabapentin, isosorbide, meclizine(if needed), metoprolol, pantoprazole.    Do not wear jewelry, make-up or nail polish, including gel polish,  artificial nails, or any other type of covering on natural nails (fingers and  toes).  Do not wear lotions, powders, or perfumes, or deodorant.  Do not shave 48 hours prior to surgery.  Men may shave face and neck.  Do not bring valuables to the hospital.  [facility] is not responsible for any belongings or valuables.  Contacts, dentures or bridgework may not be worn into surgery.  Leave your suitcase in the car.  After surgery it may be brought to your room.  For patients admitted to the hospital, discharge time will be determined by your treatment team.  Patients discharged the day of surgery will not be allowed to drive home and must have someone with them for 24 hours.    Special instructions:   DO NOT smoke tobacco or vape for 24 hours before your procedure.  Please read over the following fact sheets that you were given. Anesthesia Post-op Instructions and Care and Recovery After Surgery      Upper Endoscopy, Adult, Care After After the procedure, it is common to have a sore throat. It is also common to have: Mild stomach pain or  discomfort. Bloating. Nausea. Follow these instructions at home: The instructions below may help you care for yourself at home. Your health care provider may give you more instructions. If you have questions, ask your health care provider. If you were given a sedative during the procedure, it can affect you for several hours. Do not drive or operate machinery until your health care provider says that it is safe. If you will be going home right after the procedure, plan to have a responsible adult: Take you home from the hospital or clinic. You will not be allowed to drive. Care for you for the time you are told. Follow instructions from your health care provider about what you may eat and drink. Return to your normal activities as told by your health care provider. Ask your health care provider what activities are safe for you. Take over-the-counter and prescription medicines only as told by your health care provider. Contact a health care provider if you: Have a sore throat that lasts longer than one day. Have trouble swallowing. Have a fever. Get help right away if you: Vomit blood or your vomit looks like coffee grounds. Have bloody, black, or tarry stools. Have a very bad sore throat or you cannot swallow. Have difficulty breathing or very bad pain in your chest or abdomen. These symptoms may be an emergency. Get help right  away. Call 911. Do not wait to see if the symptoms will go away. Do not drive yourself to the hospital. Summary After the procedure, it is common to have a sore throat, mild stomach discomfort, bloating, and nausea. If you were given a sedative during the procedure, it can affect you for several hours. Do not drive until your health care provider says that it is safe. Follow instructions from your health care provider about what you may eat and drink. Return to your normal activities as told by your health care provider. This information is not intended to replace  advice given to you by your health care provider. Make sure you discuss any questions you have with your health care provider. Document Revised: 01/27/2022 Document Reviewed: 01/27/2022 Elsevier Patient Education  2024 [employer] Esophageal Dilatation Esophageal dilatation, also called esophageal dilation, is a procedure to widen or open a blocked or narrowed part of the esophagus. The esophagus is the part of the body that moves food and liquid from the mouth to the stomach. You may need this procedure if: You have a buildup of scar tissue in your esophagus that makes it difficult, painful, or impossible to swallow. This can be caused by gastroesophageal reflux disease (GERD). You have cancer of the esophagus. There is a problem with how food moves through your esophagus. In some cases, you may need this procedure repeated at a later time to dilate the esophagus gradually. Tell a health care provider about: Any allergies you have. All medicines you are taking, including vitamins, herbs, eye drops, creams, and over-the-counter medicines. Any problems you or family members have had with anesthetic medicines. Any blood disorders you have. Any surgeries you have had. Any medical conditions you have. Any antibiotic medicines you are required to take before dental procedures. Whether you are pregnant or may be pregnant. What are the risks? Generally, this is a safe procedure. However, problems may occur, including: Bleeding due to a tear in the lining of the esophagus. A hole, or perforation, in the esophagus. What happens before the procedure? Ask your health care provider about: Changing or stopping your regular medicines. This is especially important if you are taking diabetes medicines or blood thinners. Taking medicines such as aspirin and ibuprofen. These medicines can thin your blood. Do not take these medicines unless your health care provider tells you to take them. Taking  over-the-counter medicines, vitamins, herbs, and supplements. Follow instructions from your health care provider about eating or drinking restrictions. Plan to have a responsible adult take you home from the hospital or clinic. Plan to have a responsible adult care for you for the time you are told after you leave the hospital or clinic. This is important. What happens during the procedure? You may be given a medicine to help you relax (sedative). A numbing medicine may be sprayed into the back of your throat, or you may gargle the medicine. Your health care provider may perform the dilatation using various surgical instruments, such as: Simple dilators. This instrument is carefully placed in the esophagus to stretch it. Guided wire bougies. This involves using an endoscope to insert a wire into the esophagus. A dilator is passed over this wire to enlarge the esophagus. Then the wire is removed. Balloon dilators. An endoscope with a small balloon is inserted into the esophagus. The balloon is inflated to stretch the esophagus and open it up. The procedure may vary among health care providers and hospitals. What can I expect after the  procedure? Your blood pressure, heart rate, breathing rate, and blood oxygen level will be monitored until you leave the hospital or clinic. Your throat may feel slightly sore and numb. This will get better over time. You will not be allowed to eat or drink until your throat is no longer numb. When you are able to drink, urinate, and sit on the edge of the bed without nausea or dizziness, you may be able to return home. Follow these instructions at home: Take over-the-counter and prescription medicines only as told by your health care provider. If you were given a sedative during the procedure, it can affect you for several hours. Do not drive or operate machinery until your health care provider says that it is safe. Plan to have a responsible adult care for you for  the time you are told. This is important. Follow instructions from your health care provider about any eating or drinking restrictions. Do not use any products that contain nicotine or tobacco, such as cigarettes, e-cigarettes, and chewing tobacco. If you need help quitting, ask your health care provider. Keep all follow-up visits. This is important. Contact a health care provider if: You have a fever. You have pain that is not relieved by medicine. Get help right away if: You have chest pain. You have trouble breathing. You have trouble swallowing. You vomit blood. You have black, tarry, or bloody stools. These symptoms may represent a serious problem that is an emergency. Do not wait to see if the symptoms will go away. Get medical help right away. Call your local emergency services (911 in the [place] Do not drive yourself to the hospital. Summary Esophageal dilatation, also called esophageal dilation, is a procedure to widen or open a blocked or narrowed part of the esophagus. Plan to have a responsible adult take you home from the hospital or clinic. For this procedure, a numbing medicine may be sprayed into the back of your throat, or you may gargle the medicine. Do not drive or operate machinery until your health care provider says that it is safe. This information is not intended to replace advice given to you by your health care provider. Make sure you discuss any questions you have with your health care provider. Document Revised: 03/05/2020 Document Reviewed: 03/05/2020 Elsevier Patient Education  2024 Else[employer]. Monitored Anesthesia Care, Care After The following information offers guidance on how to care for yourself after your procedure. Your health care provider may also give you more specific instructions. If you have problems or questions, contact your health care provider. What can I expect after the procedure? After the procedure, it is common to  have: Tiredness. Little or no memory about what happened during or after the procedure. Impaired judgment when it comes to making decisions. Nausea or vomiting. Some trouble with balance. Follow these instructions at home: For the time period you were told by your health care provider:  Rest. Do not participate in activities where you could fall or become injured. Do not drive or use machinery. Do not drink alcohol. Do not take sleeping pills or medicines that cause drowsiness. Do not make important decisions or sign legal documents. Do not take care of children on your own. Medicines Take over-the-counter and prescription medicines only as told by your health care provider. If you were prescribed antibiotics, take them as told by your health care provider. Do not stop using the antibiotic even if you start to feel better. Eating and drinking Follow instructions from your  health care provider about what you may eat and drink. Drink enough fluid to keep your urine pale yellow. If you vomit: Drink clear fluids slowly and in small amounts as you are able. Clear fluids include water, ice chips, low-calorie sports drinks, and fruit juice that has water added to it (diluted fruit juice). Eat light and bland foods in small amounts as you are able. These foods include bananas, applesauce, rice, lean meats, toast, and crackers. General instructions  Have a responsible adult stay with you for the time you are told. It is important to have someone help care for you until you are awake and alert. If you have sleep apnea, surgery and some medicines can increase your risk for breathing problems. Follow instructions from your health care provider about wearing your sleep device: When you are sleeping. This includes during daytime naps. While taking prescription pain medicines, sleeping medicines, or medicines that make you drowsy. Do not use any products that contain nicotine or tobacco. These  products include cigarettes, chewing tobacco, and vaping devices, such as e-cigarettes. If you need help quitting, ask your health care provider. Contact a health care provider if: You feel nauseous or vomit every time you eat or drink. You feel light-headed. You are still sleepy or having trouble with balance after 24 hours. You get a rash. You have a fever. You have redness or swelling around the IV site. Get help right away if: You have trouble breathing. You have new confusion after you get home. These symptoms may be an emergency. Get help right away. Call 911. Do not wait to see if the symptoms will go away. Do not drive yourself to the hospital. This information is not intended to replace advice given to you by your health care provider. Make sure you discuss any questions you have with your health care provider. Document Revised: 03/15/2022 Document Reviewed: 03/15/2022 Elsevier Patient Education  2024 [employer].

## 2023-04-19 ENCOUNTER — Other Ambulatory Visit: Payer: Self-pay

## 2023-04-19 ENCOUNTER — Telehealth: Payer: Self-pay | Admitting: *Deleted

## 2023-04-19 ENCOUNTER — Encounter (HOSPITAL_COMMUNITY)
Admission: RE | Admit: 2023-04-19 | Discharge: 2023-04-19 | Disposition: A | Discharge status: Home / Self Care | Payer: Medicare HMO | Source: Ambulatory Visit | Attending: Internal Medicine | Admitting: Internal Medicine

## 2023-04-19 VITALS — BP 140/83 | HR 88 | Temp 97.8°F | Resp 18 | Ht 70.5 in | Wt 319.2 lb

## 2023-04-19 DIAGNOSIS — Z01812 Encounter for preprocedural laboratory examination: Secondary | ICD-10-CM | POA: Insufficient documentation

## 2023-04-19 DIAGNOSIS — E1159 Type 2 diabetes mellitus with other circulatory complications: Secondary | ICD-10-CM | POA: Insufficient documentation

## 2023-04-19 LAB — BASIC METABOLIC PANEL
Anion gap: 11 (ref 5–15)
BUN: 9 mg/dL (ref 6–20)
CO2: 25 mmol/L (ref 22–32)
Calcium: 9.3 mg/dL (ref 8.9–10.3)
Chloride: 102 mmol/L (ref 98–111)
Creatinine, Ser: 0.98 mg/dL (ref 0.61–1.24)
GFR, Estimated: >60 mL/min (ref >60)
Glucose, Bld: 123 mg/dL — ABNORMAL HIGH (ref 70–99)
Potassium: 3.5 mmol/L (ref 3.5–5.1)
Sodium: 138 mmol/L (ref 135–145)

## 2023-04-19 NOTE — Telephone Encounter (Signed)
SPOKE WITH PT. He is feeling better now. He has been rescheduled to 7/8. When he saw Toni Amend he did not have trulicity on meds therefore was not told to hold as did not know he was taking. Pt not sure when he started medication. Aware he will need to hold x 7 days prior to procedure along with his eliquis x 2 days prior. No DM meds/insulin day of procedure.

## 2023-04-19 NOTE — Telephone Encounter (Addendum)
-----   Message from Jethro Bolus, RN sent at 04/19/2023  9:11 AM EDT ----- Regarding: Cancellation Hey! Patient didn't stop his Trulicity so he will need to be rescheduled. He was having chest pains on arrival so we will do an EKG and will take his Nitro. Cardiology is aware of his chest pains and he has Nitro on hand. The last cardiology visit was in May 2004.

## 2023-04-19 NOTE — Telephone Encounter (Signed)
Pt see's cardiology at [facility] and we saw them in may. We already received cardiology clearance from them and was scanned in media.

## 2023-04-20 ENCOUNTER — Encounter: Payer: Self-pay | Admitting: *Deleted

## 2023-04-20 NOTE — Telephone Encounter (Signed)
Called pt, LMOVM with pre-op appt details 

## 2023-04-20 NOTE — Telephone Encounter (Signed)
Pt stopped by office and was given his instructions and pre-op date.

## 2023-04-21 DIAGNOSIS — J449 Chronic obstructive pulmonary disease, unspecified: Secondary | ICD-10-CM | POA: Diagnosis not present

## 2023-04-21 DIAGNOSIS — E1159 Type 2 diabetes mellitus with other circulatory complications: Secondary | ICD-10-CM | POA: Diagnosis not present

## 2023-04-21 DIAGNOSIS — G4733 Obstructive sleep apnea (adult) (pediatric): Secondary | ICD-10-CM | POA: Diagnosis not present

## 2023-04-23 ENCOUNTER — Other Ambulatory Visit: Payer: Self-pay | Admitting: Pulmonary Disease

## 2023-04-28 ENCOUNTER — Other Ambulatory Visit (HOSPITAL_BASED_OUTPATIENT_CLINIC_OR_DEPARTMENT_OTHER): Payer: Self-pay | Admitting: Pulmonary Disease

## 2023-05-01 ENCOUNTER — Other Ambulatory Visit: Payer: Self-pay | Admitting: "Endocrinology

## 2023-05-01 DIAGNOSIS — I480 Paroxysmal atrial fibrillation: Secondary | ICD-10-CM | POA: Diagnosis not present

## 2023-05-01 DIAGNOSIS — J449 Chronic obstructive pulmonary disease, unspecified: Secondary | ICD-10-CM | POA: Diagnosis not present

## 2023-05-05 DIAGNOSIS — J449 Chronic obstructive pulmonary disease, unspecified: Secondary | ICD-10-CM | POA: Diagnosis not present

## 2023-05-06 ENCOUNTER — Encounter (HOSPITAL_COMMUNITY): Payer: Self-pay

## 2023-05-06 ENCOUNTER — Encounter (HOSPITAL_COMMUNITY): Admission: RE | Admit: 2023-05-06 | Payer: Medicare HMO | Source: Ambulatory Visit

## 2023-05-06 DIAGNOSIS — Z79899 Other long term (current) drug therapy: Secondary | ICD-10-CM

## 2023-05-06 NOTE — Patient Instructions (Signed)
[name]  05/06/2023     @PREFPERIOPPHARMACY @   Your procedure is scheduled on 05/09/2023.  Report to [facility] at 11:15 A.M.  Call this number if you have problems the morning of surgery:  [phone]  If you experience any cold or flu symptoms such as cough, fever, chills, shortness of breath, etc. between now and your scheduled surgery, please notify us at the above number.   Remember:    Please follow the diet and prep instructions given to you by Dr Queen [facility]oss[facility] office.    Take these medicines the morning of surgery with A SIP OF WATER : Valium Lexapro Isosorbide Metoprolol Pantoprazole and Gabapentin   Last dose of Eliquis should be 05/06/2023   Last dose of Trulicity should be 05/01/2023   Use your inhaler before coming to the hospital and bring your rescue inhaler with you.    Do not wear jewelry, make-up or nail polish, including gel polish,  artificial nails, or any other type of covering on natural nails (fingers and  toes).  Do not wear lotions, powders, or perfumes, or deodorant.  Do not shave 48 hours prior to surgery.  Men may shave face and neck.  Do not bring valuables to the hospital.  [facility] is not responsible for any belongings or valuables.  Contacts, dentures or bridgework may not be worn into surgery.  Leave your suitcase in the car.  After surgery it may be brought to your room.  For patients admitted to the hospital, discharge time will be determined by your treatment team.  Patients discharged the day of surgery will not be allowed to drive home.   Name and phone number of your driver:   family Special instructions:  n/a  Please read over the following fact sheets that you were given. Care and Recovery After Surgery   Upper Endoscopy, Adult Upper endoscopy is a procedure to look inside the upper GI (gastrointestinal) tract. The upper GI tract is made up of: The esophagus. This is the part of the body that moves food from your mouth  to your stomach. The stomach. The duodenum. This is the first part of your small intestine. This procedure is also called esophagogastroduodenoscopy (EGD) or gastroscopy. In this procedure, your health care provider passes a thin, flexible tube (endoscope) through your mouth and down your esophagus into your stomach and into your duodenum. A small camera is attached to the end of the tube. Images from the camera appear on a monitor in the exam room. During this procedure, your health care provider may also remove a small piece of tissue to be sent to a lab and examined under a microscope (biopsy). Your health care provider may do an upper endoscopy to diagnose cancers of the upper GI tract. You may also have this procedure to find the cause of other conditions, such as: Stomach pain. Heartburn. Pain or problems when swallowing. Nausea and vomiting. Stomach bleeding. Stomach ulcers. Tell a health care provider about: Any allergies you have. All medicines you are taking, including vitamins, herbs, eye drops, creams, and over-the-counter medicines. Any problems you or family members have had with anesthetic medicines. Any bleeding problems you have. Any surgeries you have had. Any medical conditions you have. Whether you are pregnant or may be pregnant. What are the risks? Your healthcare provider will talk with you about risks. These may include: Infection. Bleeding. Allergic reactions to medicines. A tear or hole (perforation) in the esophagus, stomach, or duodenum. What  happens before the procedure? When to stop eating and drinking Follow instructions from your health care provider about what you may eat and drink. These may include: 8 hours before your procedure Stop eating most foods. Do not eat meat, fried foods, or fatty foods. Eat only light foods, such as toast or crackers. All liquids are okay except energy drinks and alcohol. 6 hours before your procedure Stop eating. Drink  only clear liquids, such as water, clear fruit juice, black coffee, plain tea, and sports drinks. Do not drink energy drinks or alcohol. 2 hours before your procedure Stop drinking all liquids. You may be allowed to take medicines with small sips of water. If you do not follow your health care provider's instructions, your procedure may be delayed or canceled. Medicines Ask your health care provider about: Changing or stopping your regular medicines. This is especially important if you are taking diabetes medicines or blood thinners. Taking medicines such as aspirin and ibuprofen. These medicines can thin your blood. Do not take these medicines unless your health care provider tells you to take them. Taking over-the-counter medicines, vitamins, herbs, and supplements. General instructions If you will be going home right after the procedure, plan to have a responsible adult: Take you home from the hospital or clinic. You will not be allowed to drive. Care for you for the time you are told. What happens during the procedure?  An IV will be inserted into one of your veins. You may be given one or more of the following: A medicine to help you relax (sedative). A medicine to numb the throat (local anesthetic). You will lie on your left side on an exam table. Your health care provider will pass the endoscope through your mouth and down your esophagus. Your health care provider will use the scope to check the inside of your esophagus, stomach, and duodenum. Biopsies may be taken. The endoscope will be removed. The procedure may vary among health care providers and hospitals. What happens after the procedure? Your blood pressure, heart rate, breathing rate, and blood oxygen level will be monitored until you leave the hospital or clinic. When your throat is no longer numb, you may be given some fluids to drink. If you were given a sedative during the procedure, it can affect you for several  hours. Do not drive or operate machinery until your health care provider says that it is safe. It is up to you to get the results of your procedure. Ask your health care provider, or the department that is doing the procedure, when your results will be ready. Contact a health care provider if you: Have a sore throat that lasts longer than 1 day. Have a fever. Get help right away if you: Vomit blood or your vomit looks like coffee grounds. Have bloody, black, or tarry stools. Have a very bad sore throat or you cannot swallow. Have difficulty breathing or very bad pain in your chest or abdomen. These symptoms may be an emergency. Get help right away. Call 911. Do not wait to see if the symptoms will go away. Do not drive yourself to the hospital. Summary Upper endoscopy is a procedure to look inside the upper GI tract. During the procedure, an IV will be inserted into one of your veins. You may be given a medicine to help you relax. The endoscope will be passed through your mouth and down your esophagus. Follow instructions from your health care provider about what you can eat and drink.  This information is not intended to replace advice given to you by your health care provider. Make sure you discuss any questions you have with your health care provider. Document Revised: 01/27/2022 Document Reviewed: 01/27/2022 Elsevier Patient Education  2024 [employer]  Monitored Anesthesia Care, Care After The following information offers guidance on how to care for yourself after your procedure. Your health care provider may also give you more specific instructions. If you have problems or questions, contact your health care provider. What can I expect after the procedure? After the procedure, it is common to have: Tiredness. Little or no memory about what happened during or after the procedure. Impaired judgment when it comes to making decisions. Nausea or vomiting. Some trouble with  balance. Follow these instructions at home: For the time period you were told by your health care provider:  Rest. Do not participate in activities where you could fall or become injured. Do not drive or use machinery. Do not drink alcohol. Do not take sleeping pills or medicines that cause drowsiness. Do not make important decisions or sign legal documents. Do not take care of children on your own. Medicines Take over-the-counter and prescription medicines only as told by your health care provider. If you were prescribed antibiotics, take them as told by your health care provider. Do not stop using the antibiotic even if you start to feel better. Eating and drinking Follow instructions from your health care provider about what you may eat and drink. Drink enough fluid to keep your urine pale yellow. If you vomit: Drink clear fluids slowly and in small amounts as you are able. Clear fluids include water, ice chips, low-calorie sports drinks, and fruit juice that has water added to it (diluted fruit juice). Eat light and bland foods in small amounts as you are able. These foods include bananas, applesauce, rice, lean meats, toast, and crackers. General instructions  Have a responsible adult stay with you for the time you are told. It is important to have someone help care for you until you are awake and alert. If you have sleep apnea, surgery and some medicines can increase your risk for breathing problems. Follow instructions from your health care provider about wearing your sleep device: When you are sleeping. This includes during daytime naps. While taking prescription pain medicines, sleeping medicines, or medicines that make you drowsy. Do not use any products that contain nicotine or tobacco. These products include cigarettes, chewing tobacco, and vaping devices, such as e-cigarettes. If you need help quitting, ask your health care provider. Contact a health care provider if: You  feel nauseous or vomit every time you eat or drink. You feel light-headed. You are still sleepy or having trouble with balance after 24 hours. You get a rash. You have a fever. You have redness or swelling around the IV site. Get help right away if: You have trouble breathing. You have new confusion after you get home. These symptoms may be an emergency. Get help right away. Call 911. Do not wait to see if the symptoms will go away. Do not drive yourself to the hospital. This information is not intended to replace advice given to you by your health care provider. Make sure you discuss any questions you have with your health care provider. Document Revised: 03/15/2022 Document Reviewed: 03/15/2022 Elsevier Patient Education  2024 [employer].

## 2023-05-06 NOTE — Pre-Procedure Instructions (Signed)
Attempted pre-op phone call. Left VM for him to call us back. 

## 2023-05-09 ENCOUNTER — Encounter (HOSPITAL_COMMUNITY): Payer: Self-pay

## 2023-05-09 ENCOUNTER — Encounter (HOSPITAL_COMMUNITY)
Admission: RE | Disposition: A | Discharge status: Home / Self Care | Payer: Self-pay | Source: Ambulatory Visit | Attending: Internal Medicine

## 2023-05-09 ENCOUNTER — Ambulatory Visit (HOSPITAL_COMMUNITY): Payer: Medicare HMO | Admitting: Anesthesiology

## 2023-05-09 ENCOUNTER — Other Ambulatory Visit: Payer: Self-pay

## 2023-05-09 ENCOUNTER — Ambulatory Visit (HOSPITAL_COMMUNITY)
Admission: RE | Admit: 2023-05-09 | Discharge: 2023-05-09 | Disposition: A | Discharge status: Home / Self Care | Payer: Medicare HMO | Source: Ambulatory Visit | Attending: Internal Medicine | Admitting: Internal Medicine

## 2023-05-09 DIAGNOSIS — Z955 Presence of coronary angioplasty implant and graft: Secondary | ICD-10-CM | POA: Insufficient documentation

## 2023-05-09 DIAGNOSIS — E1165 Type 2 diabetes mellitus with hyperglycemia: Secondary | ICD-10-CM | POA: Insufficient documentation

## 2023-05-09 DIAGNOSIS — G4733 Obstructive sleep apnea (adult) (pediatric): Secondary | ICD-10-CM | POA: Insufficient documentation

## 2023-05-09 DIAGNOSIS — F32A Depression, unspecified: Secondary | ICD-10-CM | POA: Diagnosis not present

## 2023-05-09 DIAGNOSIS — K449 Diaphragmatic hernia without obstruction or gangrene: Secondary | ICD-10-CM | POA: Insufficient documentation

## 2023-05-09 DIAGNOSIS — K2950 Unspecified chronic gastritis without bleeding: Secondary | ICD-10-CM | POA: Insufficient documentation

## 2023-05-09 DIAGNOSIS — Z7984 Long term (current) use of oral hypoglycemic drugs: Secondary | ICD-10-CM | POA: Insufficient documentation

## 2023-05-09 DIAGNOSIS — I4891 Unspecified atrial fibrillation: Secondary | ICD-10-CM | POA: Diagnosis not present

## 2023-05-09 DIAGNOSIS — I252 Old myocardial infarction: Secondary | ICD-10-CM | POA: Insufficient documentation

## 2023-05-09 DIAGNOSIS — Z8673 Personal history of transient ischemic attack (TIA), and cerebral infarction without residual deficits: Secondary | ICD-10-CM | POA: Insufficient documentation

## 2023-05-09 DIAGNOSIS — Z951 Presence of aortocoronary bypass graft: Secondary | ICD-10-CM | POA: Diagnosis not present

## 2023-05-09 DIAGNOSIS — K2970 Gastritis, unspecified, without bleeding: Secondary | ICD-10-CM

## 2023-05-09 DIAGNOSIS — E1136 Type 2 diabetes mellitus with diabetic cataract: Secondary | ICD-10-CM | POA: Diagnosis not present

## 2023-05-09 DIAGNOSIS — Z8719 Personal history of other diseases of the digestive system: Secondary | ICD-10-CM | POA: Insufficient documentation

## 2023-05-09 DIAGNOSIS — F17200 Nicotine dependence, unspecified, uncomplicated: Secondary | ICD-10-CM | POA: Diagnosis not present

## 2023-05-09 DIAGNOSIS — Z794 Long term (current) use of insulin: Secondary | ICD-10-CM | POA: Insufficient documentation

## 2023-05-09 DIAGNOSIS — I25110 Atherosclerotic heart disease of native coronary artery with unstable angina pectoris: Secondary | ICD-10-CM

## 2023-05-09 DIAGNOSIS — Z9981 Dependence on supplemental oxygen: Secondary | ICD-10-CM | POA: Diagnosis not present

## 2023-05-09 DIAGNOSIS — J449 Chronic obstructive pulmonary disease, unspecified: Secondary | ICD-10-CM | POA: Diagnosis not present

## 2023-05-09 DIAGNOSIS — I25119 Atherosclerotic heart disease of native coronary artery with unspecified angina pectoris: Secondary | ICD-10-CM | POA: Insufficient documentation

## 2023-05-09 DIAGNOSIS — K219 Gastro-esophageal reflux disease without esophagitis: Secondary | ICD-10-CM | POA: Insufficient documentation

## 2023-05-09 DIAGNOSIS — Z6841 Body Mass Index (BMI) 40.0 and over, adult: Secondary | ICD-10-CM | POA: Insufficient documentation

## 2023-05-09 DIAGNOSIS — G2581 Restless legs syndrome: Secondary | ICD-10-CM | POA: Insufficient documentation

## 2023-05-09 DIAGNOSIS — F17210 Nicotine dependence, cigarettes, uncomplicated: Secondary | ICD-10-CM | POA: Diagnosis not present

## 2023-05-09 DIAGNOSIS — I509 Heart failure, unspecified: Secondary | ICD-10-CM | POA: Insufficient documentation

## 2023-05-09 DIAGNOSIS — K2100 Gastro-esophageal reflux disease with esophagitis, without bleeding: Secondary | ICD-10-CM | POA: Diagnosis not present

## 2023-05-09 DIAGNOSIS — K2270 Barrett's esophagus without dysplasia: Secondary | ICD-10-CM

## 2023-05-09 DIAGNOSIS — I10 Essential (primary) hypertension: Secondary | ICD-10-CM | POA: Diagnosis not present

## 2023-05-09 DIAGNOSIS — R1310 Dysphagia, unspecified: Secondary | ICD-10-CM | POA: Insufficient documentation

## 2023-05-09 DIAGNOSIS — K319 Disease of stomach and duodenum, unspecified: Secondary | ICD-10-CM | POA: Diagnosis not present

## 2023-05-09 DIAGNOSIS — I110 Hypertensive heart disease with heart failure: Secondary | ICD-10-CM | POA: Diagnosis not present

## 2023-05-09 HISTORY — PX: BIOPSY: SHX5522

## 2023-05-09 HISTORY — PX: ESOPHAGEAL BRUSHING: SHX6842

## 2023-05-09 HISTORY — PX: ESOPHAGOGASTRODUODENOSCOPY (EGD) WITH PROPOFOL: SHX5813

## 2023-05-09 LAB — GLUCOSE, CAPILLARY
Glucose-Capillary: 373 mg/dL — ABNORMAL HIGH (ref 70–99)
Glucose-Capillary: 450 mg/dL — ABNORMAL HIGH (ref 70–99)

## 2023-05-09 LAB — KOH PREP: KOH Prep: NONE SEEN

## 2023-05-09 SURGERY — ESOPHAGOGASTRODUODENOSCOPY (EGD) WITH PROPOFOL
Anesthesia: General

## 2023-05-09 MED ORDER — PROPOFOL 10 MG/ML IV BOLUS
INTRAVENOUS | Status: DC | PRN
  Administered 2023-05-09: 30 mg via INTRAVENOUS
  Administered 2023-05-09: 80 mg via INTRAVENOUS
  Administered 2023-05-09: 30 mg via INTRAVENOUS

## 2023-05-09 MED ORDER — INSULIN REGULAR BOLUS VIA INFUSION: 10.0000 [IU] | Freq: Once | INTRAVENOUS | Status: DC

## 2023-05-09 MED ORDER — LIDOCAINE VISCOUS HCL 2 % MT SOLN
15.0000 mL | Freq: Once | OROMUCOSAL | Status: AC
  Administered 2023-05-09: 15 mL via OROMUCOSAL

## 2023-05-09 MED ORDER — LIDOCAINE HCL (CARDIAC) PF 100 MG/5ML IV SOSY
PREFILLED_SYRINGE | INTRAVENOUS | Status: DC | PRN
  Administered 2023-05-09: 50 mg via INTRAVENOUS

## 2023-05-09 MED ORDER — INSULIN ASPART 100 UNIT/ML IJ SOLN
10.0000 [IU] | Freq: Once | INTRAMUSCULAR | Status: AC
  Administered 2023-05-09: 10 [IU] via INTRAVENOUS
  Filled 2023-05-09: qty 0.1

## 2023-05-09 MED ORDER — IPRATROPIUM-ALBUTEROL 0.5-2.5 (3) MG/3ML IN SOLN
3.0000 mL | Freq: Once | RESPIRATORY_TRACT | Status: AC
  Administered 2023-05-09: 3 mL via RESPIRATORY_TRACT
  Filled 2023-05-09: qty 3

## 2023-05-09 MED ORDER — INSULIN REGULAR(HUMAN) IN NACL 100-0.9 UT/100ML-% IV SOLN
Freq: Once | INTRAVENOUS | Status: DC
  Filled 2023-05-09: qty 100

## 2023-05-09 MED ORDER — LIDOCAINE VISCOUS HCL 2 % MT SOLN
OROMUCOSAL | Status: AC
  Filled 2023-05-09: qty 15

## 2023-05-09 MED ORDER — LACTATED RINGERS IV SOLN: INTRAVENOUS | Status: DC

## 2023-05-09 MED ORDER — IPRATROPIUM-ALBUTEROL 0.5-2.5 (3) MG/3ML IN SOLN: 3.0000 mL | Freq: Once | RESPIRATORY_TRACT | Status: AC

## 2023-05-09 MED ORDER — IPRATROPIUM-ALBUTEROL 0.5-2.5 (3) MG/3ML IN SOLN
RESPIRATORY_TRACT | Status: AC
  Administered 2023-05-09: 3 mL via RESPIRATORY_TRACT
  Filled 2023-05-09: qty 3

## 2023-05-09 NOTE — H&P (Signed)
Primary Care Physician:  Assunta Found, MD Primary Gastroenterologist:  Dr. [name]  Pre-Procedure History & Physical: HPI:  [name] is a 62 y.o. male is here for an EGD with possible dilation to be performed for GERD dysphagia  Past Medical History:  Diagnosis Date   Abnormal myocardial perfusion study 01/01/2011   there a small to moderate sized inferobasal scar   Barrett's esophagus    Cataracts, bilateral    Chronic kidney disease    hx of kidney stones   Claudication (HCC) 11/16/2011   PV test perform shows normal   COPD (chronic obstructive pulmonary disease) (HCC)    Depression    Diabetes (HCC)    type 2 diabetes mellitus   Dysrhythmia    GERD (gastroesophageal reflux disease)    Glaucoma    Hernia of abdominal wall    History of radiation therapy    Right lung, SBRT- 04/16/21-04/30/21- Dr. Antony Blackbird   HTN (hypertension)    Hyperlipidemia    Morbid obesity (HCC)    Myocardial infarction (HCC) 2008,2009,2009   OSA (obstructive sleep apnea)    on cpap   PAF (paroxysmal atrial fibrillation) (HCC)    S/P colonoscopy 2009   3-4 mm transverse colon erosions likely secondary to  ASA   S/P endoscopy 10/2010   moderate erosive gastritis, Barrett's esophagus 1-2cm   Sleep apnea    SOB (shortness of breath) 11/03/2007   2D Echo EF 50%-55%   Stroke (HCC) 2017    Past Surgical History:  Procedure Laterality Date   BIOPSY N/A 04/29/2015   Procedure: BIOPSY;  Surgeon: West Bali, MD;  Location: [facility];  Service: Endoscopy;  Laterality: N/A;   BIOPSY  08/01/2018   Procedure: BIOPSY;  Surgeon: West Bali, MD;  Location: [facility];  Service: Endoscopy;;  esophageal   BIOPSY  10/12/2021   Procedure: BIOPSY;  Surgeon: Lanelle Bal, DO;  Location: [facility];  Service: Endoscopy;;   CABG X 4  03/2008   CARDIAC CATHETERIZATION  2009   stent placement to the left circumflex a 2.25    CARDIAC CATHETERIZATION  07/08/2010   CARDIAC CATHETERIZATION  N/A 11/02/2019   COLONOSCOPY WITH PROPOFOL N/A 09/27/2017   normal ileum, twenty 4 to 8 mm polyps in the sigmoid colon, descending colon, splenic flexure, transverse colon, ascending colon, cecum.  An additional three 2 to 4 mm polyps in the rectum and the descending colon.  Diverticulosis, internal hemorrhoids.  Surgical pathology found the polyps to be one fragment of hyperplastic polyp and 22 fragments of tubular adenoma..  Recommended repeat colonoscop   COLONOSCOPY WITH PROPOFOL N/A 10/12/2021   Procedure: COLONOSCOPY WITH PROPOFOL;  Surgeon: Lanelle Bal, DO;  Location: [facility];  Service: Endoscopy;  Laterality: N/A;  1:30pm   CORONARY ARTERY BYPASS GRAFT  2008   4 vessels   CORONARY STENT PLACEMENT  12/29/12   CORONARY STENT PLACEMENT  12/2012   ESOPHAGEAL DILATION N/A 04/29/2015   Procedure: ESOPHAGEAL DILATION 15 mm, 16 mm;  Surgeon: West Bali, MD;  Location: [facility];  Service: Endoscopy;  Laterality: N/A;   ESOPHAGOGASTRODUODENOSCOPY  09/2011   Barrett's esophagus, no dysplasia on biopsies. Distal esophagitis. Status post dilation. Moderate gastritis and duodenitis, but biopsies benign. Next EGD in November 2015 for surveillance of Barrett's esophagus.   ESOPHAGOGASTRODUODENOSCOPY (EGD) WITH PROPOFOL N/A 04/29/2015   SLF: 1. Barretts esophagus 2. Moderate non-erosive gastritis.    ESOPHAGOGASTRODUODENOSCOPY (EGD) WITH PROPOFOL N/A 08/01/2018  Barrett's, repeat in 5 years. Empiric dilatation, mild gastritis   HERNIA REPAIR     ventral hernia repair   LEFT HEART CATHETERIZATION WITH CORONARY/GRAFT ANGIOGRAM N/A 12/29/2012   Procedure: LEFT HEART CATHETERIZATION WITH Isabel Caprice;  Surgeon: Lennette Bihari, MD;  Location: [facility] CATH LAB;  Service: Cardiovascular;  Laterality: N/A;   ORIF FIBULA FRACTURE Right 06/03/2015   Procedure: OPEN REDUCTION INTERNAL FIXATION (ORIF) DISTAL FIBULA  FRACTURE;  Surgeon: Valeria Batman, MD;  Location: [facility];  Service: Orthopedics;   Laterality: Right;   PERCUTANEOUS CORONARY STENT INTERVENTION (PCI-S)  12/29/2012   Procedure: PERCUTANEOUS CORONARY STENT INTERVENTION (PCI-S);  Surgeon: Lennette Bihari, MD;  Location: [facility] CATH LAB;  Service: Cardiovascular;;   POLYPECTOMY  09/27/2017   Procedure: POLYPECTOMY;  Surgeon: West Bali, MD;  Location: [facility];  Service: Endoscopy;;  cecal polyp, ascending polyps x6, transverse colon polyps x6, splenic flexure polyps x2, descending colon polyps x6, sigmoid  colon polyp x1, rectal polyp x1    POLYPECTOMY  10/12/2021   Procedure: POLYPECTOMY;  Surgeon: Lanelle Bal, DO;  Location: [facility];  Service: Endoscopy;;   SAVORY DILATION  09/06/2011   Procedure: SAVORY DILATION;  Surgeon: Arlyce Harman, MD;  Location: [facility];  Service: Endoscopy;  Laterality: N/A;  Dilated with 15mm   SAVORY DILATION N/A 08/01/2018   Procedure: SAVORY DILATION;  Surgeon: West Bali, MD;  Location: [facility];  Service: Endoscopy;  Laterality: N/A;    Prior to Admission medications   Medication Sig Start Date End Date Taking? Authorizing Provider  albuterol (PROVENTIL) (2.5 MG/3ML) 0.083% nebulizer solution Take 3 mLs (2.5 mg total) by nebulization every 6 (six) hours as needed for wheezing or shortness of breath. 10/14/22 10/14/23 Yes Emokpae, Courage, MD  apixaban (ELIQUIS) 5 MG TABS tablet Take 1 tablet (5 mg total) by mouth 2 (two) times daily. 10/14/22  Yes Emokpae, Courage, MD  Cholecalciferol (VITAMIN D) 50 MCG (2000 UT) tablet Take 2,000 Units by mouth daily.   Yes [provider]  Cyanocobalamin (VITAMIN B-12 IJ) Inject 1,000 mcg as directed every 30 (thirty) days.   Yes [provider]  diazepam (VALIUM) 5 MG tablet Take 5 mg by mouth 2 (two) times daily.   Yes [provider]  diclofenac Sodium (VOLTAREN) 1 % GEL Apply 1 Application topically 4 (four) times daily as needed (pain).   Yes [provider]  Dulaglutide (TRULICITY) 1.5  MG/0.5ML SOPN Inject 1.5 mg into the skin every Friday.   Yes [provider]  escitalopram (LEXAPRO) 20 MG tablet Take 20 mg by mouth in the morning. 04/27/14  Yes [provider]  fenofibrate (TRICOR) 145 MG tablet Take 145 mg by mouth in the morning.   Yes [provider]  Fluticasone-Umeclidin-Vilant (TRELEGY ELLIPTA) 100-62.5-25 MCG/ACT AEPB Inhale 1 puff into the lungs daily. 02/07/23  Yes Oretha Milch, MD  furosemide (LASIX) 40 MG tablet Take 40 mg by mouth daily.   Yes [provider]  gabapentin (NEURONTIN) 300 MG capsule Take 300 mg by mouth 3 (three) times daily. 08/11/22  Yes [provider]  insulin regular human CONCENTRATED (HUMULIN R U-500 KWIKPEN) 500 UNIT/ML KwikPen Inject 60 Units into the skin 3 (three) times daily with meals. Only when blood glucose is above 90 and eating. 02/16/23  Yes Roma Kayser, MD  isosorbide mononitrate (IMDUR) 120 MG 24 hr tablet Take 120 mg by mouth in the morning.   Yes [provider]  lisinopril (ZESTRIL) 10 MG tablet Take 1 tablet (10 mg total) by mouth daily. 03/04/20  Yes Roma Kayser, MD  meclizine (ANTIVERT) 25 MG tablet Take 25 mg by mouth every 6 (six) hours as needed for dizziness or nausea.   Yes [provider]  metoprolol tartrate (LOPRESSOR) 100 MG tablet Take 1 tablet (100 mg total) by mouth 2 (two) times daily. OFFICE VISIT NEEDED BEFORE ADDITIONAL REFILLS 07/16/19  Yes Branch, Dorothe Pea, MD  modafinil (PROVIGIL) 200 MG tablet Take 200 mg by mouth daily. 09/11/19  Yes [provider]  nitroGLYCERIN (NITROLINGUAL) 0.4 MG/SPRAY spray Place 1 spray under the tongue every 5 (five) minutes x 3 doses as needed for chest pain. 01/30/19  Yes Long, Arlyss Repress, MD  pantoprazole (PROTONIX) 40 MG tablet Take 1 tablet (40 mg total) by mouth 2 (two) times daily before a meal. 03/24/23  Yes Mahon, Courtney L, NP  potassium chloride SA (KLOR-CON M) 20 MEQ tablet Take 1  tablet (20 mEq total) by mouth daily. While Taking Lasix/Furosemide Patient taking differently: Take 20 mEq by mouth 2 (two) times daily. 10/14/22  Yes Emokpae, Courage, MD  rosuvastatin (CRESTOR) 20 MG tablet Take 20 mg by mouth in the morning.   Yes [provider]  acetaminophen (TYLENOL) 500 MG tablet Take 500 mg by mouth every 6 (six) hours as needed for moderate pain.    [provider]  albuterol (VENTOLIN HFA) 108 (90 Base) MCG/ACT inhaler INHALE TWO PUFFS BY MOUTH INTO LUNGS EVERY 6 HOURS AS NEEDED FOR WHEEZING AND/OR SHORTNESS OF BREATH 04/29/23   Oretha Milch, MD  blood glucose meter kit and supplies Dispense based on patient and insurance preference. Use up to two times daily as directed. (FOR ICD-E11.65) 08/06/19   Roma Kayser, MD  Blood Glucose Monitoring Suppl (ONETOUCH VERIO) w/Device KIT 1 each by Does not apply route as needed. 06/11/21   Roma Kayser, MD  Continuous Glucose Sensor (FREESTYLE LIBRE 2 SENSOR) MISC Use to check glucose four times daily 04/04/23   Roma Kayser, MD  Insulin Lispro Prot & Lispro (HUMALOG MIX 75/25 KWIKPEN) (75-25) 100 UNIT/ML Kwikpen Inject 60 Units into the skin 2 (two) times daily. Patient not taking: Reported on 04/13/2023 03/24/23   Roma Kayser, MD  Insulin Pen Needle (B-D ULTRAFINE III SHORT PEN) 31G X 8 MM MISC Use as directed three time daily to inject insulin 05/03/23   Nida, Denman George, MD  Lancets (ONETOUCH DELICA PLUS [place]) MISC USE TO check blood sugar FOUR TIMES DAILY AS DIRECTED 06/11/20   Nida, Denman George, MD  ONETOUCH VERIO test strip USE TO check blood sugar FOUR TIMES DAILY AS DIRECTED 06/11/20   Roma Kayser, MD    Allergies as of 04/01/2023 - Review Complete 03/24/2023  Allergen Reaction Noted   Contrast media [iodinated contrast media] Other (See Comments) 03/14/2013   Iohexol Other (See Comments) 01/02/2008    Family History  Problem Relation Age of Onset    Diabetes Mother    Heart disease Mother    Diabetes Father    Heart disease Father    Pancreatic cancer Brother        age 37, doing well   Colon cancer Neg Hx    Anesthesia problems Neg Hx    Hypotension Neg Hx    Malignant hyperthermia Neg Hx    Pseudochol deficiency Neg Hx     Social History   Socioeconomic History  Marital status: Divorced    Spouse name: Not on file   Number of children: Not on file   Years of education: Not on file   Highest education level: Not on file  Occupational History   Not on file  Tobacco Use   Smoking status: Every Day    Packs/day: 0.50    Years: 30.00    Additional pack years: 0.00    Total pack years: 15.00    Types: Cigarettes   Smokeless tobacco: Never   Tobacco comments:    Smokes 0.25 packs per day [facility], 12/29/22  Vaping Use   Vaping Use: Never used  Substance and Sexual Activity   Alcohol use: Yes    Comment: occ beer   Drug use: No   Sexual activity: Never  Other Topics Concern   Not on file  Social History Narrative   Not on file   Social Determinants of Health   Financial Resource Strain: High Risk (08/23/2022)   Overall Financial Resource Strain (CARDIA)    Difficulty of Paying Living Expenses: Very hard  Food Insecurity: No Food Insecurity (10/13/2022)   Hunger Vital Sign    Worried About Running Out of Food in the Last Year: Never true    Ran Out of Food in the Last Year: Never true  Recent Concern: Food Insecurity - Food Insecurity Present (08/23/2022)   Hunger Vital Sign    Worried About Running Out of Food in the Last Year: Often true    Ran Out of Food in the Last Year: Often true  Transportation Needs: No Transportation Needs (10/13/2022)   PRAPARE - Administrator, Civil Service (Medical): No    Lack of Transportation (Non-Medical): No  Physical Activity: Not on file  Stress: Not on file  Social Connections: Not on file  Intimate Partner Violence: Not At Risk (10/13/2022)   Humiliation,  Afraid, Rape, and Kick questionnaire    Fear of Current or Ex-Partner: No    Emotionally Abused: No    Physically Abused: No    Sexually Abused: No    Review of Systems: General: Negative for fever, chills, fatigue, weakness. Eyes: Negative for vision changes.  ENT: Negative for hoarseness, difficulty swallowing , nasal congestion. CV: Negative for chest pain, angina, palpitations, dyspnea on exertion, peripheral edema.  Respiratory: Negative for dyspnea at rest, dyspnea on exertion, cough, sputum, wheezing.  GI: See history of present illness. GU:  Negative for dysuria, hematuria, urinary incontinence, urinary frequency, nocturnal urination.  MS: Negative for joint pain, low back pain.  Derm: Negative for rash or itching.  Neuro: Negative for weakness, abnormal sensation, seizure, frequent headaches, memory loss, confusion.  Psych: Negative for anxiety, depression Endo: Negative for unusual weight change.  Heme: Negative for bruising or bleeding. Allergy: Negative for rash or hives.  Physical Exam: Vital signs in last 24 hours:     General:   Alert,  Well-developed, well-nourished, pleasant and cooperative in NAD Head:  Normocephalic and atraumatic. Eyes:  Sclera clear, no icterus.   Conjunctiva pink. Ears:  Normal auditory acuity. Nose:  No deformity, discharge,  or lesions. Msk:  Symmetrical without gross deformities. Normal posture. Extremities:  Without clubbing or edema. Neurologic:  Alert and  oriented x4;  grossly normal neurologically. Skin:  Intact without significant lesions or rashes. Psych:  Alert and cooperative. Normal mood and affect.   Impression/Plan: [name] is here for an EGD with possible dilation to be performed for GERD dysphagia  Risks, benefits,  limitations, imponderables and alternatives regarding EGD have been reviewed with the patient. Questions have been answered. All parties agreeable.

## 2023-05-09 NOTE — Discharge Instructions (Signed)
EGD Discharge instructions Please read the instructions outlined below and refer to this sheet in the next few weeks. These discharge instructions provide you with general information on caring for yourself after you leave the hospital. Your doctor may also give you specific instructions. While your treatment has been planned according to the most current medical practices available, unavoidable complications occasionally occur. If you have any problems or questions after discharge, please call your doctor. ACTIVITY You may resume your regular activity but move at a slower pace for the next 24 hours.  Take frequent rest periods for the next 24 hours.  Walking will help expel (get rid of) the air and reduce the bloated feeling in your abdomen.  No driving for 24 hours (because of the anesthesia (medicine) used during the test).  You may shower.  Do not sign any important legal documents or operate any machinery for 24 hours (because of the anesthesia used during the test).  NUTRITION Drink plenty of fluids.  You may resume your normal diet.  Begin with a light meal and progress to your normal diet.  Avoid alcoholic beverages for 24 hours or as instructed by your caregiver.  MEDICATIONS You may resume your normal medications unless your caregiver tells you otherwise.  WHAT YOU CAN EXPECT TODAY You may experience abdominal discomfort such as a feeling of fullness or "gas" pains.  FOLLOW-UP Your doctor will discuss the results of your test with you.  SEEK IMMEDIATE MEDICAL ATTENTION IF ANY OF THE FOLLOWING OCCUR: Excessive nausea (feeling sick to your stomach) and/or vomiting.  Severe abdominal pain and distention (swelling).  Trouble swallowing.  Temperature over 101 F (37.8 C).  Rectal bleeding or vomiting of blood.   Your EGD revealed mild amount inflammation in your stomach.  I took biopsies of this to rule out infection with a bacteria called H. pylori.   You also had evidence of  Barrett's esophagus which also took biopsies.  You may have a mild yeast infection in your esophagus which I also sampled.  Small bowel appeared normal.   Await pathology results, my office will contact you.  Your esophagus was wide open, I did not need to stretch it today.  Continue on pantoprazole twice daily.  Follow-up in GI office in 2 to 3 months.   I hope you have a great rest of your week!  Hennie Duos. [name], D.O. Gastroenterology and Hepatology [facility]

## 2023-05-09 NOTE — Transfer of Care (Signed)
Immediate Anesthesia Transfer of Care Note  Patient: MAHAD CRILLY  Procedure(s) Performed: ESOPHAGOGASTRODUODENOSCOPY (EGD) WITH PROPOFOL BALLOON DILATION BIOPSY ESOPHAGEAL BRUSHING  Patient Location: Short Stay  Anesthesia Type:General  Level of Consciousness: awake and alert   Airway & Oxygen Therapy: Patient Spontanous Breathing and Patient connected to nasal cannula oxygen  Post-op Assessment: Report given to RN and Post -op Vital signs reviewed and stable  Post vital signs: Reviewed and stable  Last Vitals:  Vitals Value Taken Time  BP 100/70 05/09/23 1408  Temp 37 C 05/09/23 1408  Pulse 77 05/09/23 1413  Resp 2 05/09/23 1413  SpO2 92 % 05/09/23 1413    Last Pain:  Vitals:   05/09/23 1408  TempSrc:   PainSc: 0-No pain         Complications: No notable events documented.

## 2023-05-09 NOTE — Anesthesia Preprocedure Evaluation (Addendum)
Anesthesia Evaluation  Patient identified by MRN, date of birth, ID band Patient awake    Reviewed: Allergy & Precautions, H&P , NPO status , Patient's Chart, lab work & pertinent test results  Airway Mallampati: II  TM Distance: >3 FB Neck ROM: Full    Dental  (+) Upper Dentures, Lower Dentures   Pulmonary shortness of breath, with exertion, lying and Long-Term Oxygen Therapy, sleep apnea, Continuous Positive Airway Pressure Ventilation and Oxygen sleep apnea , COPD,  oxygen dependent, Current Smoker and Patient abstained from smoking. LUNG CANCER    + wheezing      Cardiovascular hypertension, Pt. on medications + angina with exertion + CAD, + Past MI, + Cardiac Stents, + CABG, +CHF and + DOE  Normal cardiovascular exam+ dysrhythmias Atrial Fibrillation  Rhythm:Regular Rate:Normal  Echocardiogram 12/2021  Left Ventricle: Systolic function is mildly abnormal. EF: 45-50%.  Left Ventricle: There is mild hypertrophy.  Left Ventricle: Left ventricle is mildly dilated.  Left Ventricle: Wall motion abnormalities as outlined in graphic representation.  Left Ventricle: Doppler parameters indicate normal diastolic function.  Left Atrium: Left atrium size is normal. Left atrium volume index is normal (16-34 mL/m2).  Aortic Valve: There is no regurgitation or stenosis.     Neuro/Psych  PSYCHIATRIC DISORDERS  Depression     Neuromuscular disease (RLS) CVA    GI/Hepatic Neg liver ROS,GERD  Medicated,,  Endo/Other  diabetes, Poorly Controlled, Type 2, Oral Hypoglycemic Agents, Insulin Dependent  Morbid obesity  Renal/GU Renal disease  negative genitourinary   Musculoskeletal negative musculoskeletal ROS (+)    Abdominal   Peds negative pediatric ROS (+)  Hematology negative hematology ROS (+)   Anesthesia Other Findings   Reproductive/Obstetrics negative OB ROS                              Anesthesia Physical Anesthesia Plan  ASA: 4  Anesthesia Plan: General   Post-op Pain Management: Minimal or no pain anticipated   Induction: Intravenous  PONV Risk Score and Plan: 0 and Propofol infusion  Airway Management Planned: Nasal Cannula, Natural Airway and Simple Face Mask  Additional Equipment:   Intra-op Plan:   Post-operative Plan:   Informed Consent: I have reviewed the patients History and Physical, chart, labs and discussed the procedure including the risks, benefits and alternatives for the proposed anesthesia with the patient or authorized representative who has indicated his/her understanding and acceptance.     Dental advisory given  Plan Discussed with: CRNA and Surgeon  Anesthesia Plan Comments: (After 2 doses of duoneb nebulizer treatment, wheezing improved, will give moderate to deep sedation during the procedure. If he desaturates during the procedure, will cancel the procedure, patient agreed with plan.)        Anesthesia Quick Evaluation

## 2023-05-09 NOTE — Op Note (Signed)
[facility] Patient Name: [name] Procedure Date: 05/09/2023 1:50 PM MRN: [id number] Date of Birth: [birth year] Attending MD: Hennie Duos. [name] , [place], [id number] CSN: [id number] Age: 62 Admit Type: Outpatient Procedure:                Upper GI endoscopy Indications:              Surveillance procedure, Dysphagia, Follow-up of                            Barrett's esophagus Providers:                Hennie Duos. [name], DO, Crystal Page, Zena Amos Referring MD:              Medicines:                See the Anesthesia note for documentation of the                            administered medications Complications:            No immediate complications. Estimated Blood Loss:     Estimated blood loss was minimal. Procedure:                Pre-Anesthesia Assessment:                           - The anesthesia plan was to use monitored                            anesthesia care (MAC).                           After obtaining informed consent, the endoscope was                            passed under direct vision. Throughout the                            procedure, the patient's blood pressure, pulse, and                            oxygen saturations were monitored continuously. The                            [id number]-H190 ([id number]) scope was introduced through the                            mouth, and advanced to the second part of duodenum.                            The upper GI endoscopy was accomplished without                            difficulty. The patient tolerated the procedure  well. Scope In: 1:59:36 PM Scope Out: 2:05:35 PM Total Procedure Duration: 0 hours 5 minutes 59 seconds  Findings:      A small hiatal hernia was present.      There were esophageal mucosal changes secondary to established       short-segment Barrett's disease present at the gastroesophageal       junction. The maximum longitudinal  extent of these mucosal changes was 2       cm in length. Mucosa was biopsied with a cold forceps for histology. One       specimen bottle was sent to pathology.      There is no endoscopic evidence of stenosis or stricture in the entire       esophagus.      Diffuse moderate inflammation characterized by erythema was found in the       entire examined stomach. Biopsies were taken with a cold forceps for       Helicobacter pylori testing.      The duodenal bulb, first portion of the duodenum and second portion of       the duodenum were normal.      White nummular lesions were noted in the upper third of the esophagus.       Cells for cytology were obtained by brushing. Impression:               - Small hiatal hernia.                           - Esophageal mucosal changes secondary to                            established short-segment Barrett's disease.                            Biopsied.                           - Gastritis. Biopsied.                           - Normal duodenal bulb, first portion of the                            duodenum and second portion of the duodenum.                           - White nummular lesions in esophageal mucosa.                            Cells for cytology obtained. Moderate Sedation:      Per Anesthesia Care Recommendation:           - Patient has a contact number available for                            emergencies. The signs and symptoms of potential                            delayed complications were discussed with the  patient. Return to normal activities tomorrow.                            Written discharge instructions were provided to the                            patient.                           - Resume previous diet.                           - Continue present medications.                           - Await pathology results.                           - Repeat upper endoscopy in 5 years for                             surveillance.                           - Return to GI clinic in 3 months.                           - Use a proton pump inhibitor PO BID. Procedure Code(s):        --- Professional ---                           [phone], Esophagogastroduodenoscopy, flexible,                            transoral; with biopsy, single or multiple Diagnosis Code(s):        --- Professional ---                           K44.9, Diaphragmatic hernia without obstruction or                            gangrene                           K22.70, Barrett's esophagus without dysplasia                           K29.70, Gastritis, unspecified, without bleeding                           K22.89, Other specified disease of esophagus                           R13.10, Dysphagia, unspecified CPT copyright 2022 [employer]. All rights reserved. The codes documented in this report are preliminary and upon coder review may  be revised to meet current compliance requirements. Hennie Duos. [name], DO Hennie Duos. Shayon Trompeter, DO 05/09/2023 2:11:30 PM This  report has been signed electronically. Number of Addenda: 0

## 2023-05-09 NOTE — Anesthesia Postprocedure Evaluation (Addendum)
Anesthesia Post Note  Patient: [name]  Procedure(s) Performed: ESOPHAGOGASTRODUODENOSCOPY (EGD) WITH PROPOFOL BIOPSY ESOPHAGEAL BRUSHING  Patient location during evaluation: Phase II Anesthesia Type: General Level of consciousness: awake and alert and oriented Pain management: pain level controlled Vital Signs Assessment: post-procedure vital signs reviewed and stable Respiratory status: spontaneous breathing, nonlabored ventilation, respiratory function stable and patient connected to nasal cannula oxygen Cardiovascular status: blood pressure returned to baseline and stable Postop Assessment: no apparent nausea or vomiting Anesthetic complications: no  No notable events documented.   Last Vitals:  Vitals:   05/09/23 1408 05/09/23 1413  BP: 100/70   Pulse: 76 77  Resp: (!) 22   Temp: 37 C   SpO2: 96% 92%    Last Pain:  Vitals:   05/09/23 1413  TempSrc:   PainSc: 0-No pain                 Ranisha Allaire C Haylea Schlichting

## 2023-05-09 NOTE — Addendum Note (Signed)
Addendum  created 05/09/23 1515 by Jamekia Gannett C, MD   Clinical Note Signed    

## 2023-05-10 DIAGNOSIS — E782 Mixed hyperlipidemia: Secondary | ICD-10-CM | POA: Diagnosis not present

## 2023-05-10 DIAGNOSIS — E1159 Type 2 diabetes mellitus with other circulatory complications: Secondary | ICD-10-CM | POA: Diagnosis not present

## 2023-05-11 LAB — COMPREHENSIVE METABOLIC PANEL
ALT: 20 IU/L (ref 0–44)
AST: 22 IU/L (ref 0–40)
Albumin: 4.3 g/dL (ref 3.8–4.9)
Alkaline Phosphatase: 85 IU/L (ref 44–121)
BUN/Creatinine Ratio: 14 (ref 10–24)
BUN: 19 mg/dL (ref 8–27)
Bilirubin Total: 0.5 mg/dL (ref 0.0–1.2)
CO2: 22 mmol/L (ref 20–29)
Calcium: 9.7 mg/dL (ref 8.6–10.2)
Chloride: 95 mmol/L — ABNORMAL LOW (ref 96–106)
Creatinine, Ser: 1.39 mg/dL — ABNORMAL HIGH (ref 0.76–1.27)
Globulin, Total: 2.6 g/dL (ref 1.5–4.5)
Glucose: 333 mg/dL — ABNORMAL HIGH (ref 70–99)
Potassium: 4.3 mmol/L (ref 3.5–5.2)
Sodium: 134 mmol/L (ref 134–144)
Total Protein: 6.9 g/dL (ref 6.0–8.5)
eGFR: 58 mL/min/{1.73_m2} — ABNORMAL LOW (ref >59)

## 2023-05-11 LAB — SURGICAL PATHOLOGY

## 2023-05-11 LAB — LIPID PANEL
Chol/HDL Ratio: 5.6 ratio — ABNORMAL HIGH (ref 0.0–5.0)
Cholesterol, Total: 134 mg/dL (ref 100–199)
HDL: 24 mg/dL — ABNORMAL LOW (ref >39)
LDL Chol Calc (NIH): 40 mg/dL (ref 0–99)
Triglycerides: 487 mg/dL — ABNORMAL HIGH (ref 0–149)
VLDL Cholesterol Cal: 70 mg/dL — ABNORMAL HIGH (ref 5–40)

## 2023-05-13 ENCOUNTER — Encounter (HOSPITAL_COMMUNITY): Payer: Self-pay | Admitting: Internal Medicine

## 2023-05-17 ENCOUNTER — Ambulatory Visit: Payer: Medicare HMO | Admitting: "Endocrinology

## 2023-05-17 ENCOUNTER — Encounter: Payer: Self-pay | Admitting: "Endocrinology

## 2023-05-17 VITALS — BP 110/52 | HR 84 | Ht 70.5 in | Wt 307.6 lb

## 2023-05-17 DIAGNOSIS — E782 Mixed hyperlipidemia: Secondary | ICD-10-CM | POA: Diagnosis not present

## 2023-05-17 DIAGNOSIS — Z794 Long term (current) use of insulin: Secondary | ICD-10-CM | POA: Insufficient documentation

## 2023-05-17 DIAGNOSIS — F17200 Nicotine dependence, unspecified, uncomplicated: Secondary | ICD-10-CM | POA: Diagnosis not present

## 2023-05-17 DIAGNOSIS — E1159 Type 2 diabetes mellitus with other circulatory complications: Secondary | ICD-10-CM

## 2023-05-17 DIAGNOSIS — I10 Essential (primary) hypertension: Secondary | ICD-10-CM

## 2023-05-17 LAB — POCT GLYCOSYLATED HEMOGLOBIN (HGB A1C): HbA1c, POC (controlled diabetic range): 9.2 % — AB (ref 0.0–7.0)

## 2023-05-17 MED ORDER — HUMULIN R U-500 KWIKPEN 500 UNIT/ML ~~LOC~~ SOPN: 70.0000 [IU] | PEN_INJECTOR | Freq: Three times a day (TID) | SUBCUTANEOUS | 2 refills | Status: DC

## 2023-05-17 MED ORDER — TRULICITY 3 MG/0.5ML ~~LOC~~ SOAJ: 3.0000 mg | SUBCUTANEOUS | 2 refills | Status: DC

## 2023-05-17 NOTE — Patient Instructions (Signed)

## 2023-05-17 NOTE — Progress Notes (Signed)
05/17/2023  Endocrinology follow-up note   Subjective:    Patient ID: [name], male    DOB: 20-Aug-1961.  he is being seen in follow-up  for management of currently uncontrolled symptomatic type 2 diabetes, hypertension, hyperlipidemia, obesity. PMD:  Assunta Found, MD.   Past Medical History:  Diagnosis Date   Abnormal myocardial perfusion study 01/01/2011   there a small to moderate sized inferobasal scar   Barrett's esophagus    Cataracts, bilateral    Chronic kidney disease    hx of kidney stones   Claudication (HCC) 11/16/2011   PV test perform shows normal   COPD (chronic obstructive pulmonary disease) (HCC)    Depression    Diabetes (HCC)    type 2 diabetes mellitus   Dysrhythmia    GERD (gastroesophageal reflux disease)    Glaucoma    Hernia of abdominal wall    History of radiation therapy    Right lung, SBRT- 04/16/21-04/30/21- Dr. Antony Blackbird   HTN (hypertension)    Hyperlipidemia    Morbid obesity (HCC)    Myocardial infarction (HCC) 2008,2009,2009   OSA (obstructive sleep apnea)    on cpap   PAF (paroxysmal atrial fibrillation) (HCC)    S/P colonoscopy 2009   3-4 mm transverse colon erosions likely secondary to  ASA   S/P endoscopy 10/2010   moderate erosive gastritis, Barrett's esophagus 1-2cm   Sleep apnea    SOB (shortness of breath) 11/03/2007   2D Echo EF 50%-55%   Stroke (HCC) 2017   Past Surgical History:  Procedure Laterality Date   BIOPSY N/A 04/29/2015   Procedure: BIOPSY;  Surgeon: West Bali, MD;  Location: [facility];  Service: Endoscopy;  Laterality: N/A;   BIOPSY  08/01/2018   Procedure: BIOPSY;  Surgeon: West Bali, MD;  Location: [facility];  Service: Endoscopy;;  esophageal   BIOPSY  10/12/2021   Procedure: BIOPSY;  Surgeon: Lanelle Bal, DO;  Location: [facility];  Service: Endoscopy;;   BIOPSY  05/09/2023   Procedure: BIOPSY;  Surgeon: Lanelle Bal, DO;  Location: [facility];  Service:  Endoscopy;;   CABG X 4  03/2008   CARDIAC CATHETERIZATION  2009   stent placement to the left circumflex a 2.25    CARDIAC CATHETERIZATION  07/08/2010   CARDIAC CATHETERIZATION N/A 11/02/2019   COLONOSCOPY WITH PROPOFOL N/A 09/27/2017   normal ileum, twenty 4 to 8 mm polyps in the sigmoid colon, descending colon, splenic flexure, transverse colon, ascending colon, cecum.  An additional three 2 to 4 mm polyps in the rectum and the descending colon.  Diverticulosis, internal hemorrhoids.  Surgical pathology found the polyps to be one fragment of hyperplastic polyp and 22 fragments of tubular adenoma..  Recommended repeat colonoscop   COLONOSCOPY WITH PROPOFOL N/A 10/12/2021   Procedure: COLONOSCOPY WITH PROPOFOL;  Surgeon: Lanelle Bal, DO;  [facility]: [facility];  Service: Endoscopy;  Laterality: N/A;  1:30pm   CORONARY ARTERY BYPASS GRAFT  2008   4 vessels   CORONARY STENT PLACEMENT  12/29/12   CORONARY STENT PLACEMENT  12/2012   ESOPHAGEAL BRUSHING  05/09/2023   Procedure: ESOPHAGEAL BRUSHING;  Surgeon: Lanelle Bal, DO;  [facility]: [facility];  Service: Endoscopy;;   ESOPHAGEAL DILATION N/A 04/29/2015   Procedure: ESOPHAGEAL DILATION 15 mm, 16 mm;  Surgeon: West Bali, MD;  Location: [facility];  Service: Endoscopy;  Laterality: N/A;   ESOPHAGOGASTRODUODENOSCOPY  09/2011   Barrett's esophagus,  no dysplasia on biopsies. Distal esophagitis. Status post dilation. Moderate gastritis and duodenitis, but biopsies benign. Next EGD in November 2015 for surveillance of Barrett's esophagus.   ESOPHAGOGASTRODUODENOSCOPY (EGD) WITH PROPOFOL N/A 04/29/2015   SLF: 1. Barretts esophagus 2. Moderate non-erosive gastritis.    ESOPHAGOGASTRODUODENOSCOPY (EGD) WITH PROPOFOL N/A 08/01/2018   Barrett's, repeat in 5 years. Empiric dilatation, mild gastritis   ESOPHAGOGASTRODUODENOSCOPY (EGD) WITH PROPOFOL N/A 05/09/2023   Procedure: ESOPHAGOGASTRODUODENOSCOPY (EGD) WITH PROPOFOL;  Surgeon: Lanelle Bal, DO;  [facility];  Service: Endoscopy;  Laterality: N/A;  730am, asa 4   HERNIA REPAIR     ventral hernia repair   LEFT HEART CATHETERIZATION WITH CORONARY/GRAFT ANGIOGRAM N/A 12/29/2012   Procedure: LEFT HEART CATHETERIZATION WITH Isabel Caprice;  Surgeon: Lennette Bihari, MD;  Location: [facility];  Service: Cardiovascular;  Laterality: N/A;   ORIF FIBULA FRACTURE Right 06/03/2015   Procedure: OPEN REDUCTION INTERNAL FIXATION (ORIF) DISTAL FIBULA  FRACTURE;  Surgeon: Valeria Batman, MD;  Location: [facility];  Service: Orthopedics;  Laterality: Right;   PERCUTANEOUS CORONARY STENT INTERVENTION (PCI-S)  12/29/2012   Procedure: PERCUTANEOUS CORONARY STENT INTERVENTION (PCI-S);  Surgeon: Lennette Bihari, MD;  Location: [facility] CATH LAB;  Service: Cardiovascular;;   POLYPECTOMY  09/27/2017   Procedure: POLYPECTOMY;  Surgeon: West Bali, MD;  [facility];  Service: Endoscopy;;  cecal polyp, ascending polyps x6, transverse colon polyps x6, splenic flexure polyps x2, descending colon polyps x6, sigmoid  colon polyp x1, rectal polyp x1    POLYPECTOMY  10/12/2021   Procedure: POLYPECTOMY;  Surgeon: Lanelle Bal, DO;  [facility];  Service: Endoscopy;;   SAVORY DILATION  09/06/2011   Procedure: SAVORY DILATION;  Surgeon: Arlyce Harman, MD;  Location: [facility];  Service: Endoscopy;  Laterality: N/A;  Dilated with 15mm   SAVORY DILATION N/A 08/01/2018   Procedure: SAVORY DILATION;  Surgeon: West Bali, MD;  [facility]: [facility];  Service: Endoscopy;  Laterality: N/A;   Social History   Socioeconomic History   Marital status: Divorced    Spouse name: Not on file   Number of children: Not on file   Years of education: Not on file   Highest education level: Not on file  Occupational History   Not on file  Tobacco Use   Smoking status: Every Day    Current packs/day: 0.50    Average packs/day: 0.5 packs/day for 30.0 years (15.0 ttl pk-yrs)     Types: Cigarettes   Smokeless tobacco: Never   Tobacco comments:    Smokes 0.25 packs per day [facility], 12/29/22  Vaping Use   Vaping status: Never Used  Substance and Sexual Activity   Alcohol use: Yes    Comment: occ beer   Drug use: No   Sexual activity: Never  Other Topics Concern   Not on file  Social History Narrative   Not on file   Social Determinants of Health   Financial Resource Strain: Low Risk  (03/22/2023)   Received from [facility]   Overall Financial Resource Strain (CARDIA)    Difficulty of Paying Living Expenses: Not very hard  Food Insecurity: No Food Insecurity (03/22/2023)   Received from [facility]   Hunger Vital Sign    Worried About Running Out of Food in the Last Year: Never true    Ran Out of Food in the Last Year: Never true  Transportation Needs: No Transportation Needs (03/22/2023)   Received  from [facility]    Lack of Transportation (Medical): No    Lack of Transportation (Non-Medical): No  Physical Activity: Not on file  Stress: Not on file  Social Connections: Unknown (03/03/2022)   Received from [facility]   Social Network    Social Network: Not on file   Outpatient Encounter Medications as of 05/17/2023  Medication Sig   Dulaglutide (TRULICITY) 3 MG/0.5ML SOPN Inject 3 mg as directed once a week.   acetaminophen (TYLENOL) 500 MG tablet Take 500 mg by mouth every 6 (six) hours as needed for moderate pain.   albuterol (PROVENTIL) (2.5 MG/3ML) 0.083% nebulizer solution Take 3 mLs (2.5 mg total) by nebulization every 6 (six) hours as needed for wheezing or shortness of breath.   albuterol (VENTOLIN HFA) 108 (90 Base) MCG/ACT inhaler INHALE TWO PUFFS BY MOUTH INTO LUNGS EVERY 6 HOURS AS NEEDED FOR WHEEZING AND/OR SHORTNESS OF BREATH   apixaban (ELIQUIS) 5 MG TABS tablet Take 1 tablet (5 mg total) by mouth 2 (two) times daily.   blood glucose meter kit and supplies Dispense based on patient and insurance preference.  Use up to two times daily as directed. (FOR ICD-E11.65)   Blood Glucose Monitoring Suppl (ONETOUCH VERIO) w/Device KIT 1 each by Does not apply route as needed.   Cholecalciferol (VITAMIN D) 50 MCG (2000 UT) tablet Take 2,000 Units by mouth daily.   Continuous Glucose Sensor (FREESTYLE LIBRE 2 SENSOR) MISC Use to check glucose four times daily   Cyanocobalamin (VITAMIN B-12 IJ) Inject 1,000 mcg as directed every 30 (thirty) days.   diazepam (VALIUM) 5 MG tablet Take 5 mg by mouth 2 (two) times daily.   diclofenac Sodium (VOLTAREN) 1 % GEL Apply 1 Application topically 4 (four) times daily as needed (pain).   escitalopram (LEXAPRO) 20 MG tablet Take 20 mg by mouth in the morning.   fenofibrate (TRICOR) 145 MG tablet Take 145 mg by mouth in the morning.   Fluticasone-Umeclidin-Vilant (TRELEGY ELLIPTA) 100-62.5-25 MCG/ACT AEPB Inhale 1 puff into the lungs daily.   furosemide (LASIX) 40 MG tablet Take 40 mg by mouth daily.   gabapentin (NEURONTIN) 300 MG capsule Take 300 mg by mouth 3 (three) times daily.   Insulin Pen Needle (B-D ULTRAFINE III SHORT PEN) 31G X 8 MM MISC Use as directed three time daily to inject insulin   insulin regular human CONCENTRATED (HUMULIN R U-500 KWIKPEN) 500 UNIT/ML KwikPen Inject 70 Units into the skin 3 (three) times daily with meals. Only when blood glucose is above 90 and eating.   isosorbide mononitrate (IMDUR) 120 MG 24 hr tablet Take 120 mg by mouth in the morning.   Lancets (ONETOUCH DELICA PLUS LANCET33G) MISC USE TO check blood sugar FOUR TIMES DAILY AS DIRECTED   lisinopril (ZESTRIL) 10 MG tablet Take 1 tablet (10 mg total) by mouth daily.   meclizine (ANTIVERT) 25 MG tablet Take 25 mg by mouth every 6 (six) hours as needed for dizziness or nausea.   metoprolol tartrate (LOPRESSOR) 100 MG tablet Take 1 tablet (100 mg total) by mouth 2 (two) times daily. OFFICE VISIT NEEDED BEFORE ADDITIONAL REFILLS   modafinil (PROVIGIL) 200 MG tablet Take 200 mg by mouth daily.    nitroGLYCERIN (NITROLINGUAL) 0.4 MG/SPRAY spray Place 1 spray under the tongue every 5 (five) minutes x 3 doses as needed for chest pain.   ONETOUCH VERIO test strip USE TO check blood sugar FOUR TIMES DAILY AS DIRECTED   pantoprazole (  PROTONIX) 40 MG tablet Take 1 tablet (40 mg total) by mouth 2 (two) times daily before a meal.   potassium chloride SA (KLOR-CON M) 20 MEQ tablet Take 1 tablet (20 mEq total) by mouth daily. While Taking Lasix/Furosemide (Patient taking differently: Take 20 mEq by mouth 2 (two) times daily.)   rosuvastatin (CRESTOR) 20 MG tablet Take 20 mg by mouth in the morning.   [DISCONTINUED] Dulaglutide (TRULICITY) 1.5 MG/0.5ML SOPN Inject 1.5 mg into the skin every Friday.   [DISCONTINUED] Insulin Lispro Prot & Lispro (HUMALOG MIX 75/25 KWIKPEN) (75-25) 100 UNIT/ML Kwikpen Inject 60 Units into the skin 2 (two) times daily. (Patient not taking: Reported on 04/13/2023)   [DISCONTINUED] insulin regular human CONCENTRATED (HUMULIN R U-500 KWIKPEN) 500 UNIT/ML KwikPen Inject 60 Units into the skin 3 (three) times daily with meals. Only when blood glucose is above 90 and eating.   No facility-administered encounter medications on file as of 05/17/2023.    ALLERGIES: Allergies  Allergen Reactions   Contrast Media [Iodinated Contrast Media] Other (See Comments)    Pt must be premedicated before given contrast media - stops heart   Iohexol Other (See Comments)     Consult with radiologist before pre meds are given.Desc: PT. STATES HEART STOPPED HAS TO BE PREMED.     VACCINATION STATUS: Immunization History  Administered Date(s) Administered   Influenza Inj Mdck Quad Pf 09/25/2018   Influenza Split 08/23/2014, 10/23/2015   Influenza,inj,Quad PF,6+ Mos 08/03/2016, 07/10/2019, 09/22/2020, 09/14/2021, 07/31/2022    Diabetes He presents for his follow-up diabetic visit. He has type 2 diabetes mellitus. Onset time: He was diagnosed at approximate age of 42 years. His disease  course has been worsening. There are no hypoglycemic associated symptoms. Pertinent negatives for hypoglycemia include no confusion, headaches, pallor or seizures. Pertinent negatives for diabetes include no blurred vision, no chest pain, no fatigue, no polydipsia, no polyphagia, no polyuria and no weakness. There are no hypoglycemic complications. Symptoms are worsening. Diabetic complications include a CVA and heart disease. Risk factors for coronary artery disease include diabetes mellitus, dyslipidemia, hypertension, male sex, obesity, tobacco exposure, sedentary lifestyle and family history. Current diabetic treatments: He was supposed to be on basal insulin, unfortunately he ran out and could not afford refills and he did not call clinic for alternatives. He is compliant with treatment none of the time. His weight is fluctuating minimally. He is following a generally unhealthy diet. When asked about meal planning, he reported none. He has not had a previous visit with a dietitian. He never participates in exercise. His home blood glucose trend is increasing steadily. His breakfast blood glucose range is generally >200 mg/dl. His lunch blood glucose range is generally >200 mg/dl. His dinner blood glucose range is generally >200 mg/dl. His bedtime blood glucose range is generally >200 mg/dl. His overall blood glucose range is >200 mg/dl. [place] presents with his CGM which shows continued struggle on his back to engage.  His AGP report shows 27% time in range, 23% and 1 hypoglycemia, 48% 2 hyperglycemia.  His average blood glucose is 262 mg per DL.  His point-of-care A1c is 9.2% increasing from 8.8% during his last visit.  He did not document hypoglycemia.        ) An ACE inhibitor/angiotensin II receptor blocker is being taken. He does not see a podiatrist.Eye exam is not current.  Hyperlipidemia This is a chronic problem. The current episode started more than 1 year ago. The problem is uncontrolled.  Recent lipid  tests were reviewed and are high. Exacerbating diseases include diabetes and obesity. Pertinent negatives include no chest pain, myalgias or shortness of breath. Current antihyperlipidemic treatment includes statins. Compliance problems include medication cost and psychosocial issues.  Risk factors for coronary artery disease include dyslipidemia, diabetes mellitus, hypertension, male sex, obesity, family history and a sedentary lifestyle.  Hypertension This is a chronic problem. The current episode started more than 1 year ago. The problem is uncontrolled. Pertinent negatives include no blurred vision, chest pain, headaches, neck pain, palpitations or shortness of breath. Risk factors for coronary artery disease include diabetes mellitus, dyslipidemia, male gender, obesity, family history, sedentary lifestyle and smoking/tobacco exposure. Past treatments include ACE inhibitors. Hypertensive end-organ damage includes CVA.     Review of systems  Constitutional: + Minimally fluctuating body weight,  current  Body mass index is 43.51 kg/m. , no fatigue, no subjective hyperthermia, no subjective hypothermia    Objective:    BP (!) 110/52   Pulse 84   Ht 5' 10.5" (1.791 m)   Wt (!) 307 lb 9.6 oz (139.5 kg)   BMI 43.51 kg/m   Wt Readings from Last 3 Encounters:  05/17/23 (!) 307 lb 9.6 oz (139.5 kg)  04/19/23 (!) 319 lb 3.6 oz (144.8 kg)  03/24/23 (!) 319 lb 3.2 oz (144.8 kg)      Physical Exam- Limited  Constitutional:  Body mass index is 43.51 kg/m. , not in acute distress, normal state of mind    CMP ( most recent) CMP     Component Value Date/Time   NA 134 05/10/2023 1404   K 4.3 05/10/2023 1404   CL 95 (L) 05/10/2023 1404   CO2 22 05/10/2023 1404   GLUCOSE 333 (H) 05/10/2023 1404   GLUCOSE 123 (H) 04/19/2023 0835   BUN 19 05/10/2023 1404   CREATININE 1.39 (H) 05/10/2023 1404   CALCIUM 9.7 05/10/2023 1404   PROT 6.9 05/10/2023 1404   ALBUMIN 4.3 05/10/2023  1404   AST 22 05/10/2023 1404   ALT 20 05/10/2023 1404   ALKPHOS 85 05/10/2023 1404   BILITOT 0.5 05/10/2023 1404   GFRNONAA >60 04/19/2023 0835   GFRAA 93 12/04/2020 0000   Diabetic Labs (most recent): Lab Results  Component Value Date   HGBA1C 9.2 (A) 05/17/2023   HGBA1C 8.8 (A) 02/10/2023   HGBA1C 7.5 (H) 10/12/2022   MICROALBUR 10 06/26/2020     Lipid Panel ( most recent) Lipid Panel     Component Value Date/Time   CHOL 134 05/10/2023 1404   TRIG 487 (H) 05/10/2023 1404   HDL 24 (L) 05/10/2023 1404   CHOLHDL 5.6 (H) 05/10/2023 1404   CHOLHDL 6.1 04/28/2014 0121   VLDL 61 (H) 04/28/2014 0121   LDLCALC 40 05/10/2023 1404     Assessment & Plan:   1. DM type 2 causing vascular disease (HCC)  - Patient has currently uncontrolled symptomatic type 2 DM since  62 years of age.  [place] presents with his CGM which shows continued struggle on his back to engage.  His AGP report shows 27% time in range, 23% and 1 hypoglycemia, 48% 2 hyperglycemia.  His average blood glucose is 262 mg per DL.  His point-of-care A1c is 9.2% increasing from 8.8% during his last visit.  He did not document hypoglycemia.      -his diabetes is complicated by coronary artery disease, CVA, obesity/sedentary life, chronic heavy smoking and ANDERSSON LARRABEE remains at extremely high risk  for more acute and chronic  complications which include CAD, CVA, CKD, retinopathy, and neuropathy. These are all discussed in detail with the patient.  - I have counseled him on diet management and weight loss, by adopting a carbohydrate restricted/protein rich diet.  Leeman is really struggling to connect the dots in his self-care.  Admittedly, he does not follow the routine meal and incretin times recommended.  However he wishes to stay on the same regimen of insulin and Trulicity.  - he acknowledges that there is a room for improvement in his food and drink choices. - Suggestion is made for him to avoid simple  carbohydrates  from his diet including Cakes, Sweet Desserts, Ice Cream, Soda (diet and regular), Sweet Tea, Candies, Chips, Cookies, Store Bought Juices, Alcohol in Excess of  1-2 drinks a day, Artificial Sweeteners,  Coffee Creamer, and "Sugar-free" Products, Lemonade. This will help patient to have more stable blood glucose profile and potentially avoid unintended weight gain. - I encouraged him to switch to  unprocessed or minimally processed complex starch and increased protein intake (animal or plant source), fruits, and vegetables. Whole food, plant-based diet was discussed in detail with him.  - he is advised to stick to a routine mealtimes to eat 3 meals  a day and avoid unnecessary snacks ( to snack only to correct hypoglycemia).   - I have approached him with the following individualized plan to manage diabetes and patient agrees:   -He has social problems, not enough support.  Reportedly sleeps extended hours, could be missing some meals.  He is at risk of hypoglycemia.   In the interest of safety, his insulin will be adjusted only slowly.  He is advised to increase his U500 insulin to 70 60 units  3 times a day for Premeal blood glucose readings above 90 mg per DL,  associated with strict monitoring of blood glucose 4 times a day-before meals and at bedtime, using his CGM continuously. -He is advised to use his meter as a backup when he has problem with his CGM.  - He does not tolerate metformin due to GI side effects.  -He is tolerating Trulicity.  I discussed and increase his Trulicity to 3 mg subcutaneously weekly.  He remains a smoker, and with significant hypertriglyceridemia, at risk for pancreatitis. -Patient is encouraged to call clinic for blood glucose levels less than 70 or above 200 mg /dl.   - Patient specific target  A1c;  LDL, HDL, Triglycerides,  were discussed in detail.  2) BP/HTN:  -His blood pressure is controlled to target.  He remains on his lisinopril 10 mg  p.o. daily, metoprolol 100 mg p.o. twice daily, and advised to continue. The patient was counseled on the dangers of tobacco use, and was advised to quit.  Reviewed strategies to maximize success, including removing cigarettes and smoking materials from environment.   3) Lipids/HPL: He has had history of severe hypertriglyceridemia, recently improving to 230 from 469, mainly associated with improvement in his glycemic profile.  His LDL is on target at 62.  He is advised to continue Trilipix 135 mg p.o. nightly, continue Crestor 20 mg p.o. nightly.  He is also on fenofibrate 140 mg p.o. nightly.       4)  Weight/Diet: His BMI is 43.51--clearly complicating his diabetes care.  He is a candidate for modest weight loss.  CDE Consult has been  initiated , exercise, and detailed carbohydrates information provided.  5) vitamin D deficiency: He is on ongoing supplement with vitamin D2 50,000  units weekly for 12 weeks.  6) Chronic Care/Health Maintenance:  -he  is on ACEI/ARB and Statin medications and  is encouraged to continue to follow up with Ophthalmology, Dentist,  Podiatrist at least yearly or according to recommendations, and advised to  quit smoking. I have recommended yearly flu vaccine and pneumonia vaccination at least every 5 years; moderate intensity exercise for up to 150 minutes weekly; and  sleep for at least 7 hours a day.   - I advised patient to maintain close follow up with Assunta Found, MD for primary care needs.    I spent  41  minutes in the care of the patient today including review of labs from CMP, Lipids, Thyroid Function, Hematology (current and previous including abstractions from other facilities); face-to-face time discussing  his blood glucose readings/logs, discussing hypoglycemia and hyperglycemia episodes and symptoms, medications doses, his options of short and long term treatment based on the latest standards of care / guidelines;  discussion about incorporating  lifestyle medicine;  and documenting the encounter. Risk reduction counseling performed per USPSTF guidelines to reduce  obesity and cardiovascular risk factors.     Please refer to Patient Instructions for Blood Glucose Monitoring and Insulin/Medications Dosing Guide"  in media tab for additional information. Please  also refer to " Patient Self Inventory" in the Media  tab for reviewed elements of pertinent patient history.  Christie Beckers participated in the discussions, expressed understanding, and voiced agreement with the above plans.  All questions were answered to his satisfaction. he is encouraged to contact clinic should he have any questions or concerns prior to his return visit.   Follow up plan: - Return in about 4 months (around 09/17/2023) for Bring Meter/CGM Device/Logs- A1c in Office.  Marquis Lunch, MD Phone: [phone]  Fax: [phone]   05/17/2023, 7:27 PM   This note was partially dictated with voice recognition software. Similar sounding words can be transcribed inadequately or may not  be corrected upon review.

## 2023-05-21 DIAGNOSIS — E1159 Type 2 diabetes mellitus with other circulatory complications: Secondary | ICD-10-CM | POA: Diagnosis not present

## 2023-05-21 DIAGNOSIS — J449 Chronic obstructive pulmonary disease, unspecified: Secondary | ICD-10-CM | POA: Diagnosis not present

## 2023-05-21 DIAGNOSIS — G4733 Obstructive sleep apnea (adult) (pediatric): Secondary | ICD-10-CM | POA: Diagnosis not present

## 2023-06-05 DIAGNOSIS — J449 Chronic obstructive pulmonary disease, unspecified: Secondary | ICD-10-CM | POA: Diagnosis not present

## 2023-06-06 ENCOUNTER — Other Ambulatory Visit (HOSPITAL_BASED_OUTPATIENT_CLINIC_OR_DEPARTMENT_OTHER): Payer: Self-pay | Admitting: Pulmonary Disease

## 2023-06-09 ENCOUNTER — Ambulatory Visit (HOSPITAL_COMMUNITY)
Admission: RE | Admit: 2023-06-09 | Discharge: 2023-06-09 | Disposition: A | Discharge status: Home / Self Care | Payer: Medicare HMO | Source: Ambulatory Visit | Attending: Pulmonary Disease | Admitting: Pulmonary Disease

## 2023-06-09 DIAGNOSIS — R918 Other nonspecific abnormal finding of lung field: Secondary | ICD-10-CM | POA: Insufficient documentation

## 2023-06-09 DIAGNOSIS — J479 Bronchiectasis, uncomplicated: Secondary | ICD-10-CM | POA: Diagnosis not present

## 2023-06-09 DIAGNOSIS — I700 Atherosclerosis of aorta: Secondary | ICD-10-CM | POA: Diagnosis not present

## 2023-06-09 DIAGNOSIS — J432 Centrilobular emphysema: Secondary | ICD-10-CM | POA: Diagnosis not present

## 2023-06-09 DIAGNOSIS — C3490 Malignant neoplasm of unspecified part of unspecified bronchus or lung: Secondary | ICD-10-CM | POA: Diagnosis not present

## 2023-06-14 DIAGNOSIS — E538 Deficiency of other specified B group vitamins: Secondary | ICD-10-CM | POA: Diagnosis not present

## 2023-06-17 ENCOUNTER — Telehealth (HOSPITAL_BASED_OUTPATIENT_CLINIC_OR_DEPARTMENT_OTHER): Payer: Self-pay

## 2023-06-17 NOTE — Telephone Encounter (Signed)
While giving CT results the patient mentioned his oxygen tank that attached to his CPAP was making a funny sound. He uses [employer] and has been given the number to reach out to them for help.

## 2023-06-20 DIAGNOSIS — E1159 Type 2 diabetes mellitus with other circulatory complications: Secondary | ICD-10-CM | POA: Diagnosis not present

## 2023-06-21 ENCOUNTER — Encounter: Payer: Self-pay | Admitting: Gastroenterology

## 2023-06-21 DIAGNOSIS — G4733 Obstructive sleep apnea (adult) (pediatric): Secondary | ICD-10-CM | POA: Diagnosis not present

## 2023-06-21 DIAGNOSIS — J449 Chronic obstructive pulmonary disease, unspecified: Secondary | ICD-10-CM | POA: Diagnosis not present

## 2023-07-02 DIAGNOSIS — I5030 Unspecified diastolic (congestive) heart failure: Secondary | ICD-10-CM | POA: Diagnosis not present

## 2023-07-02 DIAGNOSIS — J449 Chronic obstructive pulmonary disease, unspecified: Secondary | ICD-10-CM | POA: Diagnosis not present

## 2023-07-02 DIAGNOSIS — I130 Hypertensive heart and chronic kidney disease with heart failure and stage 1 through stage 4 chronic kidney disease, or unspecified chronic kidney disease: Secondary | ICD-10-CM | POA: Diagnosis not present

## 2023-07-02 DIAGNOSIS — I480 Paroxysmal atrial fibrillation: Secondary | ICD-10-CM | POA: Diagnosis not present

## 2023-07-06 DIAGNOSIS — J449 Chronic obstructive pulmonary disease, unspecified: Secondary | ICD-10-CM | POA: Diagnosis not present

## 2023-07-18 DIAGNOSIS — E1122 Type 2 diabetes mellitus with diabetic chronic kidney disease: Secondary | ICD-10-CM | POA: Diagnosis not present

## 2023-07-18 DIAGNOSIS — H524 Presbyopia: Secondary | ICD-10-CM | POA: Diagnosis not present

## 2023-07-18 DIAGNOSIS — Z794 Long term (current) use of insulin: Secondary | ICD-10-CM | POA: Diagnosis not present

## 2023-07-18 DIAGNOSIS — H5203 Hypermetropia, bilateral: Secondary | ICD-10-CM | POA: Diagnosis not present

## 2023-07-18 DIAGNOSIS — Z0100 Encounter for examination of eyes and vision without abnormal findings: Secondary | ICD-10-CM | POA: Diagnosis not present

## 2023-07-18 DIAGNOSIS — H25813 Combined forms of age-related cataract, bilateral: Secondary | ICD-10-CM | POA: Diagnosis not present

## 2023-07-20 DIAGNOSIS — G4733 Obstructive sleep apnea (adult) (pediatric): Secondary | ICD-10-CM | POA: Diagnosis not present

## 2023-07-20 DIAGNOSIS — J449 Chronic obstructive pulmonary disease, unspecified: Secondary | ICD-10-CM | POA: Diagnosis not present

## 2023-07-21 DIAGNOSIS — I10 Essential (primary) hypertension: Secondary | ICD-10-CM | POA: Diagnosis not present

## 2023-07-21 DIAGNOSIS — Z951 Presence of aortocoronary bypass graft: Secondary | ICD-10-CM | POA: Diagnosis not present

## 2023-07-21 DIAGNOSIS — I480 Paroxysmal atrial fibrillation: Secondary | ICD-10-CM | POA: Diagnosis not present

## 2023-07-21 DIAGNOSIS — E1159 Type 2 diabetes mellitus with other circulatory complications: Secondary | ICD-10-CM | POA: Diagnosis not present

## 2023-07-21 DIAGNOSIS — Z955 Presence of coronary angioplasty implant and graft: Secondary | ICD-10-CM | POA: Diagnosis not present

## 2023-07-26 DIAGNOSIS — E538 Deficiency of other specified B group vitamins: Secondary | ICD-10-CM | POA: Diagnosis not present

## 2023-07-31 NOTE — Progress Notes (Unsigned)
GI Office Note    Referring Provider: Assunta Found, MD Primary Care Physician:  Assunta Found, MD Primary Gastroenterologist: Hennie Duos. [name], DO  Date:  08/01/2023  ID:  [name], DOB 12/29/1960, MRN [id number]  Chief Complaint   Chief Complaint  Patient presents with   Follow-up    Follow up. Diarrhea sometimes in the mornings    History of Present Illness  [name] is a 62 y.o. male with a history of GERD, barrett's esophagus, diabetes, MI in 2008/2009, COPD, Afib on eliquis, HTN, HLD, OSA on CPAP, and stroke in 2017 presenting today for follow up.   EGD 2019: -normal esophagus s/p dilation -mild gastritis -repeat in 5 years   Colonoscopy 10/12/21: - Preparation of the colon was fair. - Non-bleeding internal hemorrhoids. - One 2 mm polyp in the transverse colon - Three 4 to 6 mm polyps in the descending colon. - The examination was otherwise normal. - Repeat in 5 years.   Last office visit 03/24/23. Recently with low bp. On O2 nightly with CPAP, some O2 during day. SOB at baseline. Rectal leakage 1-2 times per week. Reflux well controlled on medication. Currently on valium to help with choking sensation/dysphagia. Rare toilet tissue hematochezia. Advised fiber supplement to help bulk stools. Rectal exam offered however deferred by patient.   EGD 05/09/23: -small hiatal hernia -esophageal changes secondary to known barrett's disease sp biopsy -gastritis s/p biopsy -normal duodenum -white nummular lesions in esophagus -Path: reactive gastropathy, reflux esophagitis -KOH negative -Advised PPI BID -Surveillance EGD in 5 years  A1c 9.2 on 7/16  Today: GERD - Still feeling choked a lot when eating. He feels like his throat just shuts off. He states this can occur at random times and even without eating. Still tacking valium to help with this.  Does have some dry heaves but is not nauseas.   Loose stools - Has been trying some fiber at first but has  had difficulty with cost. Sometimes stool is loose and sometimes it hurts and sometimes has to strain to go. Feels like a knife at time and it is hard. Sometimes has leakage at night. In the mornings he will go 2-3 times within an hour and a half and the stools will be looser. Rare toilet tissue hematochezia. No abdominal pain.   Has wound on his back that is draining and taking a while to heal.   Current Outpatient Medications  Medication Sig Dispense Refill   acetaminophen (TYLENOL) 500 MG tablet Take 500 mg by mouth every 6 (six) hours as needed for moderate pain.     albuterol (PROVENTIL) (2.5 MG/3ML) 0.083% nebulizer solution Take 3 mLs (2.5 mg total) by nebulization every 6 (six) hours as needed for wheezing or shortness of breath. 75 mL 12   albuterol (VENTOLIN HFA) 108 (90 Base) MCG/ACT inhaler INHALE TWO PUFFS BY MOUTH INTO LUNGS EVERY 6 HOURS AS NEEDED FOR WHEEZING AND/OR SHORTNESS OF BREATH 8.5 g 3   apixaban (ELIQUIS) 5 MG TABS tablet Take 1 tablet (5 mg total) by mouth 2 (two) times daily. 60 tablet 1   blood glucose meter kit and supplies Dispense based on patient and insurance preference. Use up to two times daily as directed. (FOR ICD-E11.65) 1 each 5   Blood Glucose Monitoring Suppl (ONETOUCH VERIO) w/Device KIT 1 each by Does not apply route as needed. 1 kit 0   Cholecalciferol (VITAMIN D) 50 MCG (2000 UT) tablet Take 2,000 Units by mouth daily.  Continuous Glucose Sensor (FREESTYLE LIBRE 2 SENSOR) MISC Use to check glucose four times daily 6 each 0   Cyanocobalamin (VITAMIN B-12 IJ) Inject 1,000 mcg as directed every 30 (thirty) days.     diazepam (VALIUM) 5 MG tablet Take 5 mg by mouth 2 (two) times daily.     diclofenac Sodium (VOLTAREN) 1 % GEL Apply 1 Application topically 4 (four) times daily as needed (pain).     Dulaglutide (TRULICITY) 3 MG/0.5ML SOPN Inject 3 mg as directed once a week. 2 mL 2   escitalopram (LEXAPRO) 20 MG tablet Take 20 mg by mouth in the morning.      fenofibrate (TRICOR) 145 MG tablet Take 145 mg by mouth in the morning.     furosemide (LASIX) 40 MG tablet Take 40 mg by mouth daily.     gabapentin (NEURONTIN) 300 MG capsule Take 300 mg by mouth 3 (three) times daily.     Insulin Pen Needle (B-D ULTRAFINE III SHORT PEN) 31G X 8 MM MISC Use as directed three time daily to inject insulin 100 each 4   insulin regular human CONCENTRATED (HUMULIN R U-500 KWIKPEN) 500 UNIT/ML KwikPen Inject 70 Units into the skin 3 (three) times daily with meals. Only when blood glucose is above 90 and eating. 12 mL 2   isosorbide mononitrate (IMDUR) 120 MG 24 hr tablet Take 120 mg by mouth in the morning.     Lancets (ONETOUCH DELICA PLUS LANCET33G) MISC USE TO check blood sugar FOUR TIMES DAILY AS DIRECTED 200 each 1   lisinopril (ZESTRIL) 10 MG tablet Take 1 tablet (10 mg total) by mouth daily. 90 tablet 1   meclizine (ANTIVERT) 25 MG tablet Take 25 mg by mouth every 6 (six) hours as needed for dizziness or nausea.     metoprolol tartrate (LOPRESSOR) 100 MG tablet Take 1 tablet (100 mg total) by mouth 2 (two) times daily. OFFICE VISIT NEEDED BEFORE ADDITIONAL REFILLS 28 tablet 0   modafinil (PROVIGIL) 200 MG tablet Take 200 mg by mouth daily.     nitroGLYCERIN (NITROLINGUAL) 0.4 MG/SPRAY spray Place 1 spray under the tongue every 5 (five) minutes x 3 doses as needed for chest pain. 12 g 0   ONETOUCH VERIO test strip USE TO check blood sugar FOUR TIMES DAILY AS DIRECTED 200 strip 1   pantoprazole (PROTONIX) 40 MG tablet Take 1 tablet (40 mg total) by mouth 2 (two) times daily before a meal. 180 tablet 3   potassium chloride SA (KLOR-CON M) 20 MEQ tablet Take 1 tablet (20 mEq total) by mouth daily. While Taking Lasix/Furosemide (Patient taking differently: Take 20 mEq by mouth 2 (two) times daily.) 30 tablet 0   rosuvastatin (CRESTOR) 20 MG tablet Take 20 mg by mouth in the morning.     TRELEGY ELLIPTA 100-62.5-25 MCG/ACT AEPB Inhale 1 puff into the lungs daily. 60  each 2   No current facility-administered medications for this visit.    Past Medical History:  Diagnosis Date   Abnormal myocardial perfusion study 01/01/2011   there a small to moderate sized inferobasal scar   Barrett's esophagus    Cataracts, bilateral    Chronic kidney disease    hx of kidney stones   Claudication (HCC) 11/16/2011   PV test perform shows normal   COPD (chronic obstructive pulmonary disease) (HCC)    Depression    Diabetes (HCC)    type 2 diabetes mellitus   Dysrhythmia    GERD (gastroesophageal reflux disease)  Glaucoma    Hernia of abdominal wall    History of radiation therapy    Right lung, SBRT- 04/16/21-04/30/21- Dr. Antony Blackbird   HTN (hypertension)    Hyperlipidemia    Morbid obesity (HCC)    Myocardial infarction (HCC) 2008,2009,2009   OSA (obstructive sleep apnea)    on cpap   PAF (paroxysmal atrial fibrillation) (HCC)    S/P colonoscopy 2009   3-4 mm transverse colon erosions likely secondary to  ASA   S/P endoscopy 10/2010   moderate erosive gastritis, Barrett's esophagus 1-2cm   Sleep apnea    SOB (shortness of breath) 11/03/2007   2D Echo EF 50%-55%   Stroke (HCC) 2017    Past Surgical History:  Procedure Laterality Date   BIOPSY N/A 04/29/2015   Procedure: BIOPSY;  Surgeon: West Bali, MD;  Location: [facility];  Service: Endoscopy;  Laterality: N/A;   BIOPSY  08/01/2018   Procedure: BIOPSY;  Surgeon: West Bali, MD;  Location: [facility];  Service: Endoscopy;;  esophageal   BIOPSY  10/12/2021   Procedure: BIOPSY;  Surgeon: Lanelle Bal, DO;  Location: [facility];  Service: Endoscopy;;   BIOPSY  05/09/2023   Procedure: BIOPSY;  Surgeon: Lanelle Bal, DO;  Location: [facility];  Service: Endoscopy;;   CABG X 4  03/2008   CARDIAC CATHETERIZATION  2009   stent placement to the left circumflex a 2.25    CARDIAC CATHETERIZATION  07/08/2010   CARDIAC CATHETERIZATION N/A 11/02/2019   COLONOSCOPY WITH PROPOFOL  N/A 09/27/2017   normal ileum, twenty 4 to 8 mm polyps in the sigmoid colon, descending colon, splenic flexure, transverse colon, ascending colon, cecum.  An additional three 2 to 4 mm polyps in the rectum and the descending colon.  Diverticulosis, internal hemorrhoids.  Surgical pathology found the polyps to be one fragment of hyperplastic polyp and 22 fragments of tubular adenoma..  Recommended repeat colonoscop   COLONOSCOPY WITH PROPOFOL N/A 10/12/2021   Procedure: COLONOSCOPY WITH PROPOFOL;  Surgeon: Lanelle Bal, DO;  [facility]: [facility];  Service: Endoscopy;  Laterality: N/A;  1:30pm   CORONARY ARTERY BYPASS GRAFT  2008   4 vessels   CORONARY STENT PLACEMENT  12/29/12   CORONARY STENT PLACEMENT  12/2012   ESOPHAGEAL BRUSHING  05/09/2023   Procedure: ESOPHAGEAL BRUSHING;  Surgeon: Lanelle Bal, DO;  [facility]: [facility];  Service: Endoscopy;;   ESOPHAGEAL DILATION N/A 04/29/2015   Procedure: ESOPHAGEAL DILATION 15 mm, 16 mm;  Surgeon: West Bali, MD;  Location: [facility];  Service: Endoscopy;  Laterality: N/A;   ESOPHAGOGASTRODUODENOSCOPY  09/2011   Barrett's esophagus, no dysplasia on biopsies. Distal esophagitis. Status post dilation. Moderate gastritis and duodenitis, but biopsies benign. Next EGD in November 2015 for surveillance of Barrett's esophagus.   ESOPHAGOGASTRODUODENOSCOPY (EGD) WITH PROPOFOL N/A 04/29/2015   SLF: 1. Barretts esophagus 2. Moderate non-erosive gastritis.    ESOPHAGOGASTRODUODENOSCOPY (EGD) WITH PROPOFOL N/A 08/01/2018   Barrett's, repeat in 5 years. Empiric dilatation, mild gastritis   ESOPHAGOGASTRODUODENOSCOPY (EGD) WITH PROPOFOL N/A 05/09/2023   Procedure: ESOPHAGOGASTRODUODENOSCOPY (EGD) WITH PROPOFOL;  Surgeon: Lanelle Bal, DO;  [facility];  Service: Endoscopy;  Laterality: N/A;  730am, asa 4   HERNIA REPAIR     ventral hernia repair   LEFT HEART CATHETERIZATION WITH CORONARY/GRAFT ANGIOGRAM N/A 12/29/2012   Procedure: LEFT  HEART CATHETERIZATION WITH Isabel Caprice;  Surgeon: Lennette Bihari, MD;  Location: [facility] CATH LAB;  Service: Cardiovascular;  Laterality: N/A;   ORIF FIBULA FRACTURE Right 06/03/2015   Procedure: OPEN REDUCTION INTERNAL FIXATION (ORIF) DISTAL FIBULA  FRACTURE;  Surgeon: Valeria Batman, MD;  Location: [facility];  Service: Orthopedics;  Laterality: Right;   PERCUTANEOUS CORONARY STENT INTERVENTION (PCI-S)  12/29/2012   Procedure: PERCUTANEOUS CORONARY STENT INTERVENTION (PCI-S);  Surgeon: Lennette Bihari, MD;  Location: [facility] CATH LAB;  Service: Cardiovascular;;   POLYPECTOMY  09/27/2017   Procedure: POLYPECTOMY;  Surgeon: West Bali, MD;  [facility];  Service: Endoscopy;;  cecal polyp, ascending polyps x6, transverse colon polyps x6, splenic flexure polyps x2, descending colon polyps x6, sigmoid  colon polyp x1, rectal polyp x1    POLYPECTOMY  10/12/2021   Procedure: POLYPECTOMY;  Surgeon: Lanelle Bal, DO;  [facility]: [facility];  Service: Endoscopy;;   SAVORY DILATION  09/06/2011   Procedure: SAVORY DILATION;  Surgeon: Arlyce Harman, MD;  Location: [facility];  Service: Endoscopy;  Laterality: N/A;  Dilated with 15mm   SAVORY DILATION N/A 08/01/2018   Procedure: SAVORY DILATION;  Surgeon: West Bali, MD;  [facility];  Service: Endoscopy;  Laterality: N/A;    Family History  Problem Relation Age of Onset   Diabetes Mother    Heart disease Mother    Diabetes Father    Heart disease Father    Pancreatic cancer Brother        age 72, doing well   Colon cancer Neg Hx    Anesthesia problems Neg Hx    Hypotension Neg Hx    Malignant hyperthermia Neg Hx    Pseudochol deficiency Neg Hx     Allergies as of 08/01/2023 - Review Complete 08/01/2023  Allergen Reaction Noted   Contrast media [iodinated contrast media] Other (See Comments) 03/14/2013   Iohexol Other (See Comments) 01/02/2008    Social History   Socioeconomic History   Marital status:  Divorced    Spouse name: Not on file   Number of children: Not on file   Years of education: Not on file   Highest education level: Not on file  Occupational History   Not on file  Tobacco Use   Smoking status: Every Day    Current packs/day: 0.50    Average packs/day: 0.5 packs/day for 30.0 years (15.0 ttl pk-yrs)    Types: Cigarettes   Smokeless tobacco: Never   Tobacco comments:    Smokes 0.25 packs per day [facility], 12/29/22  Vaping Use   Vaping status: Never Used  Substance and Sexual Activity   Alcohol use: Yes    Comment: occ beer   Drug use: No   Sexual activity: Never  Other Topics Concern   Not on file  Social History Narrative   Not on file   Social Determinants of Health   Financial Resource Strain: Low Risk  (03/22/2023)   Received from [facility]   Overall Financial Resource Strain (CARDIA)    Difficulty of Paying Living Expenses: Not very hard  Food Insecurity: No Food Insecurity (03/22/2023)   Received [facility]   Hunger Vital Sign    Worried About Running Out of Food in the Last Year: Never true    Ran Out of Food in the Last Year: Never true  Transportation Needs: No Transportation Needs (03/22/2023)   Received from [facility], [facility]   PRAPARE - Transportation    Lack of Transportation (Medical): No    Lack  of Transportation (Non-Medical): No  Physical Activity: Not on file  Stress: Not on file  Social Connections: Unknown (03/03/2022)   Received from [facility]   Social Network    Social Network: Not on file     Review of Systems   Gen: Denies fever, chills, anorexia. Denies fatigue, weakness, weight loss.  CV: Denies chest pain, palpitations, syncope, peripheral edema, and claudication. Resp: Denies dyspnea at rest, cough, wheezing, coughing up blood, and pleurisy. GI: See HPI Derm: Denies rash, itching, dry skin Psych: Denies depression, anxiety, memory loss, confusion. No  homicidal or suicidal ideation.  Heme: Denies bruising, bleeding, and enlarged lymph nodes.   Physical Exam   BP 107/70 (BP Location: Left Arm, Patient Position: Sitting, Cuff Size: Large)   Pulse 93   Temp 97.6 F (36.4 C) (Temporal)   Ht 5\' 10"  (1.778 m)   Wt (!) 311 lb 3.2 oz (141.2 kg)   BMI 44.65 kg/m   General:   Alert and oriented. No distress noted. Pleasant and cooperative.  Head:  Normocephalic and atraumatic. Eyes:  Conjuctiva clear without scleral icterus. Abdomen:  +BS, soft, rounded, non tender. Small periumbilical hernia.  No rebound or guarding. UTA for HSM.  Rectal: deferred Msk:  Symmetrical without gross deformities. Normal posture. Extremities:  Without edema. Neurologic:  Alert and  oriented x4 Skin: midline wound with erythema and small induration to mid upper back.  Band-Aid in place and observed thin yellow drainage.  Psych:  Alert and cooperative. Normal mood and affect.   Assessment  [name] is a 62 y.o. male with a history of GERD, barrett's esophagus, MI in 2008/2009, COPD, Afib on eliquis, HTN, HLD, OSA on CPAP, and stroke in 2017 presenting today for follow up.  GERD, Barrett's esophagus: History of Barrett's esophagus on EGD in 2019.  Also had dilation performed at that time for dysphagia.  Recent EGD in July with esophageal mucosal changes assistant with Barrett's esophagus with biopsies consistent with reflux esophagitis.  EGD also with gastritis revealing reactive gastropathy.  Reports GERD fairly well-controlled with pantoprazole 40 mg twice daily.  Continues to have some intermittent issues with dysphagia however states that Valium has been helping with a choking sensation.   Constipation, Mild fecal leaking/soiling: Continues to have some intermittent fecal leakage/soiling at nighttime usually in very small amounts.  Continues to report a couple of very small looser stools in the morning time.  Originally felt to be secondary to his  medications and diabetes.  States he tried fiber for 1 week however has not continue due to cost.  He does report intermittent issues with hard stools that sometimes feel very sharp on defecation.  Recently has not had any hematochezia.  Patient given a referral to dermatology given a wound on his mid back that is draining yellow thin fluid.  Mild erythema and some mild induration present but no overt pus noted.  Advised him to keep the area clean dry and intact and covered until he is able to see dermatology or primary care.  PLAN   Continue pantoprazole 40mg  BID, refilled today. Start fiber supplement daily.  Gave samples of Metamucil from the office today. Start miralax 17g once daily. Dermatology referral for midline back wound. Follow up 3 months.   Brooke Bonito, MSN, FNP-BC, AGACNP-BC [facility]

## 2023-08-01 ENCOUNTER — Telehealth: Payer: Self-pay | Admitting: Gastroenterology

## 2023-08-01 ENCOUNTER — Ambulatory Visit: Payer: Medicare HMO | Admitting: Gastroenterology

## 2023-08-01 ENCOUNTER — Encounter: Payer: Self-pay | Admitting: Gastroenterology

## 2023-08-01 VITALS — BP 107/70 | HR 93 | Temp 97.6°F | Ht 70.0 in | Wt 311.2 lb

## 2023-08-01 DIAGNOSIS — S21209A Unspecified open wound of unspecified back wall of thorax without penetration into thoracic cavity, initial encounter: Secondary | ICD-10-CM

## 2023-08-01 DIAGNOSIS — K2270 Barrett's esophagus without dysplasia: Secondary | ICD-10-CM

## 2023-08-01 DIAGNOSIS — E1140 Type 2 diabetes mellitus with diabetic neuropathy, unspecified: Secondary | ICD-10-CM | POA: Diagnosis not present

## 2023-08-01 DIAGNOSIS — K5900 Constipation, unspecified: Secondary | ICD-10-CM | POA: Diagnosis not present

## 2023-08-01 DIAGNOSIS — K219 Gastro-esophageal reflux disease without esophagitis: Secondary | ICD-10-CM

## 2023-08-01 DIAGNOSIS — R151 Fecal smearing: Secondary | ICD-10-CM | POA: Diagnosis not present

## 2023-08-01 DIAGNOSIS — J449 Chronic obstructive pulmonary disease, unspecified: Secondary | ICD-10-CM | POA: Diagnosis not present

## 2023-08-01 DIAGNOSIS — Z794 Long term (current) use of insulin: Secondary | ICD-10-CM | POA: Diagnosis not present

## 2023-08-01 MED ORDER — PSYLLIUM 58.6 % PO PACK: 1.0000 | PACK | Freq: Every day | ORAL | 12 refills | Status: DC

## 2023-08-01 MED ORDER — PANTOPRAZOLE SODIUM 40 MG PO TBEC: 40.0000 mg | DELAYED_RELEASE_TABLET | Freq: Two times a day (BID) | ORAL | 3 refills | Status: AC

## 2023-08-01 MED ORDER — POLYETHYLENE GLYCOL 3350 17 GM/SCOOP PO POWD: ORAL | 0 refills | Status: AC

## 2023-08-01 NOTE — Patient Instructions (Signed)
Continue pantoprazole 40 mg twice daily.  I sent in refill to the new pharmacy for you today.  I would for you to to start a daily fiber supplement, Metamucil in 8 ounces of water daily. In addition to this I would also like for you to start MiraLAX 17 g once daily.  I have sent both of these medications to the pharmacy for you.  It was a pleasure to see you today. I want to create trusting relationships with patients. If you receive a survey regarding your visit,  I greatly appreciate you taking time to fill this out on paper or through your MyChart. I value your feedback.  Brooke Bonito, MSN, FNP-BC, AGACNP-BC [facility]

## 2023-08-01 NOTE — Telephone Encounter (Signed)
Will send once OV note done to Dr. Daleen Bo office in [place]

## 2023-08-01 NOTE — Telephone Encounter (Signed)
Observed wound to patient's midline back today.  I placed external dermatology referral but please fax to [facility] dermatology in [facility] given no location in [place].  If patient would like to go to [facility] the end can refer to [facility] dermatology or [place] dermatology.  Brooke Bonito, MSN, APRN, FNP-BC, AGACNP-BC [facility]

## 2023-08-03 ENCOUNTER — Other Ambulatory Visit: Payer: Self-pay

## 2023-08-03 DIAGNOSIS — E1159 Type 2 diabetes mellitus with other circulatory complications: Secondary | ICD-10-CM

## 2023-08-03 MED ORDER — HUMULIN R U-500 KWIKPEN 500 UNIT/ML ~~LOC~~ SOPN
70.0000 [IU] | PEN_INJECTOR | Freq: Three times a day (TID) | SUBCUTANEOUS | 2 refills | Status: DC
Start: 2023-08-03 — End: 2023-08-09

## 2023-08-03 MED ORDER — BD PEN NEEDLE SHORT U/F 31G X 8 MM MISC: 1 refills | Status: AC

## 2023-08-05 DIAGNOSIS — J449 Chronic obstructive pulmonary disease, unspecified: Secondary | ICD-10-CM | POA: Diagnosis not present

## 2023-08-09 ENCOUNTER — Other Ambulatory Visit: Payer: Self-pay

## 2023-08-09 DIAGNOSIS — E1159 Type 2 diabetes mellitus with other circulatory complications: Secondary | ICD-10-CM

## 2023-08-09 MED ORDER — HUMULIN R U-500 KWIKPEN 500 UNIT/ML ~~LOC~~ SOPN
70.0000 [IU] | PEN_INJECTOR | Freq: Three times a day (TID) | SUBCUTANEOUS | 0 refills | Status: DC
Start: 2023-08-09 — End: 2024-02-02

## 2023-08-11 ENCOUNTER — Other Ambulatory Visit (HOSPITAL_BASED_OUTPATIENT_CLINIC_OR_DEPARTMENT_OTHER): Payer: Self-pay

## 2023-08-11 DIAGNOSIS — L02212 Cutaneous abscess of back [any part, except buttock]: Secondary | ICD-10-CM | POA: Diagnosis not present

## 2023-08-11 DIAGNOSIS — R58 Hemorrhage, not elsewhere classified: Secondary | ICD-10-CM | POA: Diagnosis not present

## 2023-08-11 DIAGNOSIS — L2989 Other pruritus: Secondary | ICD-10-CM | POA: Diagnosis not present

## 2023-08-11 DIAGNOSIS — L538 Other specified erythematous conditions: Secondary | ICD-10-CM | POA: Diagnosis not present

## 2023-08-11 DIAGNOSIS — R208 Other disturbances of skin sensation: Secondary | ICD-10-CM | POA: Diagnosis not present

## 2023-08-11 MED ORDER — ALBUTEROL SULFATE HFA 108 (90 BASE) MCG/ACT IN AERS: 1.0000 | INHALATION_SPRAY | Freq: Four times a day (QID) | RESPIRATORY_TRACT | 3 refills | Status: DC | PRN

## 2023-08-17 ENCOUNTER — Telehealth: Payer: Self-pay | Admitting: Pulmonary Disease

## 2023-08-17 NOTE — Telephone Encounter (Signed)
Patient states he is a Dr. Vassie Loll patient here in [place] and Dr. Vassie Loll had been giving him samples of Trelegy because it is so expensive.  He has an appointment with Rubye Oaks, NP at the end of October, but he is totally out of Trelegy.  Can we give him samples?  Please call [phone].

## 2023-08-18 ENCOUNTER — Other Ambulatory Visit: Payer: Self-pay

## 2023-08-18 NOTE — Telephone Encounter (Signed)
Sampled and patient assistance application left at front desk.   LVM to make patient aware.

## 2023-08-19 DIAGNOSIS — G4733 Obstructive sleep apnea (adult) (pediatric): Secondary | ICD-10-CM | POA: Diagnosis not present

## 2023-08-19 DIAGNOSIS — J449 Chronic obstructive pulmonary disease, unspecified: Secondary | ICD-10-CM | POA: Diagnosis not present

## 2023-08-20 DIAGNOSIS — E1159 Type 2 diabetes mellitus with other circulatory complications: Secondary | ICD-10-CM | POA: Diagnosis not present

## 2023-08-24 ENCOUNTER — Encounter (INDEPENDENT_AMBULATORY_CARE_PROVIDER_SITE_OTHER): Payer: Self-pay

## 2023-08-24 ENCOUNTER — Ambulatory Visit (INDEPENDENT_AMBULATORY_CARE_PROVIDER_SITE_OTHER): Payer: Medicare HMO | Admitting: Otolaryngology

## 2023-08-24 VITALS — Ht 70.0 in | Wt 313.0 lb

## 2023-08-24 DIAGNOSIS — R1319 Other dysphagia: Secondary | ICD-10-CM

## 2023-08-24 DIAGNOSIS — K219 Gastro-esophageal reflux disease without esophagitis: Secondary | ICD-10-CM | POA: Diagnosis not present

## 2023-08-24 NOTE — Progress Notes (Signed)
Patient ID: [name], male   DOB: 20-Nov-1960, 62 y.o.   MRN: [id number]  Follow up: LPR, anxiety, choking sensation  HPI: The patient is a 62 year old male who returns today for his follow-up evaluation. The patient has a history of gastroesophageal reflux and laryngopharyngeal reflux. His anxiety and frequent choking sensation were treated with b.i.d. valium (5mg ) regimen. At his last visit, he was noted to have persistent mild posterior laryngeal edema. He was continued on pantoprazole daily regimen. The patient returns today reporting occasional globus sensation and choking.  No other ENT, GI, or respiratory issue noted since the last visit.   Exam: General: Communicates without difficulty, well nourished, no acute distress. Head: Normocephalic, no evidence injury, no tenderness, facial buttresses intact without stepoff. Face/sinus: No tenderness to palpation and percussion. Facial movement is normal and symmetric. Eyes: PERRL, EOMI. No scleral icterus, conjunctivae clear. Neuro: CN II exam reveals vision grossly intact.  No nystagmus at any point of gaze. Ears: Auricles well formed without lesions.  Ear canals are intact without mass or lesion.  No erythema or edema is appreciated.  The TMs are intact without fluid. Nose: External evaluation reveals normal support and skin without lesions.  Dorsum is intact.  Anterior rhinoscopy reveals congested mucosa over anterior aspect of inferior turbinates and intact septum.  No purulence noted. Oral:  Oral cavity and oropharynx are intact, symmetric, without erythema or edema.  Mucosa is moist without lesions.  Indirect laryngoscopy reveals mild posterior laryngeal edema.  Neck: Full range of motion without pain.  There is no significant lymphadenopathy.  No masses palpable.  Thyroid bed within normal limits to palpation.  Parotid glands and submandibular glands equal bilaterally without mass.  Trachea is midline. Neuro:  CN 2-12 grossly intact.     Assessment: 1. Stable mild posterior laryngeal edema, consistent with laryngoscopy pharyngeal reflux.  2. His anxiety and choking sensation are under control. No other abnormality is noted today.  Plan: 1. The physical exam findings are reviewed with the patient.  2. Continue valium and pantoprazole.  3. The patient will return for re-evaluation in 6 months. The patient is encouraged to call with any questions or concerns.

## 2023-08-29 ENCOUNTER — Emergency Department (HOSPITAL_COMMUNITY): Payer: Medicare HMO

## 2023-08-29 ENCOUNTER — Emergency Department (HOSPITAL_COMMUNITY)
Admission: EM | Admit: 2023-08-29 | Discharge: 2023-08-30 | Disposition: A | Discharge status: Home / Self Care | Payer: Medicare HMO

## 2023-08-29 ENCOUNTER — Other Ambulatory Visit: Payer: Self-pay

## 2023-08-29 ENCOUNTER — Encounter (HOSPITAL_COMMUNITY): Payer: Self-pay

## 2023-08-29 DIAGNOSIS — S2241XA Multiple fractures of ribs, right side, initial encounter for closed fracture: Secondary | ICD-10-CM | POA: Diagnosis not present

## 2023-08-29 DIAGNOSIS — Y9301 Activity, walking, marching and hiking: Secondary | ICD-10-CM | POA: Insufficient documentation

## 2023-08-29 DIAGNOSIS — L02212 Cutaneous abscess of back [any part, except buttock]: Secondary | ICD-10-CM | POA: Diagnosis not present

## 2023-08-29 DIAGNOSIS — R109 Unspecified abdominal pain: Secondary | ICD-10-CM | POA: Diagnosis not present

## 2023-08-29 DIAGNOSIS — I10 Essential (primary) hypertension: Secondary | ICD-10-CM | POA: Insufficient documentation

## 2023-08-29 DIAGNOSIS — Z7901 Long term (current) use of anticoagulants: Secondary | ICD-10-CM | POA: Insufficient documentation

## 2023-08-29 DIAGNOSIS — Z79899 Other long term (current) drug therapy: Secondary | ICD-10-CM | POA: Insufficient documentation

## 2023-08-29 DIAGNOSIS — R0789 Other chest pain: Secondary | ICD-10-CM | POA: Diagnosis present

## 2023-08-29 DIAGNOSIS — S3993XA Unspecified injury of pelvis, initial encounter: Secondary | ICD-10-CM | POA: Diagnosis not present

## 2023-08-29 DIAGNOSIS — Z794 Long term (current) use of insulin: Secondary | ICD-10-CM | POA: Diagnosis not present

## 2023-08-29 DIAGNOSIS — S3991XA Unspecified injury of abdomen, initial encounter: Secondary | ICD-10-CM | POA: Diagnosis not present

## 2023-08-29 DIAGNOSIS — J984 Other disorders of lung: Secondary | ICD-10-CM | POA: Diagnosis not present

## 2023-08-29 DIAGNOSIS — W130XXA Fall from, out of or through balcony, initial encounter: Secondary | ICD-10-CM | POA: Insufficient documentation

## 2023-08-29 DIAGNOSIS — R1011 Right upper quadrant pain: Secondary | ICD-10-CM | POA: Diagnosis not present

## 2023-08-29 DIAGNOSIS — D235 Other benign neoplasm of skin of trunk: Secondary | ICD-10-CM | POA: Diagnosis not present

## 2023-08-29 DIAGNOSIS — S20211A Contusion of right front wall of thorax, initial encounter: Secondary | ICD-10-CM | POA: Insufficient documentation

## 2023-08-29 LAB — CBC
HCT: 47.5 % (ref 39.0–52.0)
Hemoglobin: 16.1 g/dL (ref 13.0–17.0)
MCH: 32.3 pg (ref 26.0–34.0)
MCHC: 33.9 g/dL (ref 30.0–36.0)
MCV: 95.2 fL (ref 80.0–100.0)
Platelets: 279 10*3/uL (ref 150–400)
RBC: 4.99 MIL/uL (ref 4.22–5.81)
RDW: 12.7 % (ref 11.5–15.5)
WBC: 14.1 10*3/uL — ABNORMAL HIGH (ref 4.0–10.5)
nRBC: 0 % (ref 0.0–0.2)

## 2023-08-29 LAB — BASIC METABOLIC PANEL
Anion gap: 7 (ref 5–15)
BUN: 10 mg/dL (ref 6–20)
CO2: 31 mmol/L (ref 22–32)
Calcium: 9.1 mg/dL (ref 8.9–10.3)
Chloride: 101 mmol/L (ref 98–111)
Creatinine, Ser: 1.25 mg/dL — ABNORMAL HIGH (ref 0.61–1.24)
GFR, Estimated: >60 mL/min (ref >60)
Glucose, Bld: 95 mg/dL (ref 70–99)
Potassium: 3.6 mmol/L (ref 3.5–5.1)
Sodium: 139 mmol/L (ref 135–145)

## 2023-08-29 MED ORDER — SODIUM CHLORIDE 0.9 % IV BOLUS
1000.0000 mL | Freq: Once | INTRAVENOUS | Status: AC
  Administered 2023-08-29: 1000 mL via INTRAVENOUS

## 2023-08-29 MED ORDER — IPRATROPIUM-ALBUTEROL 0.5-2.5 (3) MG/3ML IN SOLN
3.0000 mL | Freq: Once | RESPIRATORY_TRACT | Status: AC
  Administered 2023-08-29: 3 mL via RESPIRATORY_TRACT
  Filled 2023-08-29: qty 3

## 2023-08-29 NOTE — ED Notes (Signed)
Pt requesting a breathing treatment, EDP made aware. Awaiting orders.

## 2023-08-29 NOTE — ED Triage Notes (Signed)
C/o right rib pain after mechanical fall off porch PTA.  Denies hitting head. Denies loc.  Pt currently on eliquis 3lpm [place] baseline

## 2023-08-29 NOTE — ED Provider Triage Note (Signed)
Emergency Medicine Provider Triage Evaluation Note  [name] , a 62 y.o. male  was evaluated in triage.  Pt complains of R sided chest pain which is worse with palpation, movement, and coughing. Pain began after a fall in the woods. Is on 3.5L chronic O2, but was not using his O2 in the woods and became "dizzy". States this happens "from time to time". No preceding chest pain, syncope/near syncope, fevers. Is on Eliquis. No head trauma or LOC.  Review of Systems  Positive: As above Negative: As above  Physical Exam  BP (!) 94/57 (BP Location: Left Arm)   Pulse 87   Temp 97.8 F (36.6 C)   Resp (!) 26   SpO2 94%  Gen:   Awake, no distress. Obese male. Resp:  Normal effort on chronic nasal cannula MSK:   Moves extremities without difficulty  Other:  TTP to right anterior chest wall without crepitus. No hematoma or contusion to trunk, back.  Medical Decision Making  Medically screening exam initiated at 5:24 PM.  Appropriate orders placed.  [name] was informed that the remainder of the evaluation will be completed by another provider, this initial triage assessment does not replace that evaluation, and the importance of remaining in the ED until their evaluation is complete.  R sided chest wall pain 2/2 fall - imaging pending. No hypoxia on chronic O2 requirement.   Antony Madura, PA-C 08/29/23 1728

## 2023-08-29 NOTE — ED Provider Notes (Signed)
[place] EMERGENCY DEPARTMENT AT [facility] Provider Note   CSN: [id number] Arrival date & time: 08/29/23  1636     History {Add pertinent medical, surgical, social history, OB history to HPI:1} Chief Complaint  Patient presents with   [name] is a 62 y.o. male.  62 year old male with past medical history of atrial fibrillation on Eliquis, hypertension, and hyperlipidemia presenting to the emergency department today with right-sided chest wall pain and abdominal pain.  The patient states that he was walking down the stairs off his porch when he lost his footing.  He states that he did fall against the porch and struck the right side of his chest and abdomen.  The patient states he has been having pain there since.  He did report some lightheadedness after this.  He denies any significant shortness of breath but states it does hurt to take a deep breath then.  He came to the emergency department at time for further evaluation.  He did not hit his head or lose consciousness.  He tells me that his blood pressures at home can run in the 70s to 80s systolic to low 100s at his baseline.  He denies any recent fevers or chills.   Fall Associated symptoms include chest pain and abdominal pain.       Home Medications Prior to Admission medications   Medication Sig Start Date End Date Taking? Authorizing Provider  acetaminophen (TYLENOL) 500 MG tablet Take 500 mg by mouth every 6 (six) hours as needed for moderate pain.    [provider]  albuterol (PROVENTIL) (2.5 MG/3ML) 0.083% nebulizer solution Take 3 mLs (2.5 mg total) by nebulization every 6 (six) hours as needed for wheezing or shortness of breath. 10/14/22 10/14/23  Shon Hale, MD  albuterol (VENTOLIN HFA) 108 (90 Base) MCG/ACT inhaler Inhale 1-2 puffs into the lungs every 6 (six) hours as needed for wheezing or shortness of breath. 08/11/23   Oretha Milch, MD  apixaban (ELIQUIS) 5 MG  TABS tablet Take 1 tablet (5 mg total) by mouth 2 (two) times daily. 10/14/22   Shon Hale, MD  blood glucose meter kit and supplies Dispense based on patient and insurance preference. Use up to two times daily as directed. (FOR ICD-E11.65) 08/06/19   Roma Kayser, MD  Blood Glucose Monitoring Suppl (ONETOUCH VERIO) w/Device KIT 1 each by Does not apply route as needed. 06/11/21   Roma Kayser, MD  Cholecalciferol (VITAMIN D) 50 MCG (2000 UT) tablet Take 2,000 Units by mouth daily.    [provider]  Continuous Glucose Sensor (FREESTYLE LIBRE 2 SENSOR) MISC Use to check glucose four times daily 04/04/23   Roma Kayser, MD  Cyanocobalamin (VITAMIN B-12 IJ) Inject 1,000 mcg as directed every 30 (thirty) days.    [provider]  diazepam (VALIUM) 5 MG tablet Take 5 mg by mouth 2 (two) times daily.    [provider]  diclofenac Sodium (VOLTAREN) 1 % GEL Apply 1 Application topically 4 (four) times daily as needed (pain).    [provider]  Dulaglutide (TRULICITY) 3 MG/0.5ML SOPN Inject 3 mg as directed once a week. 05/17/23   Roma Kayser, MD  escitalopram (LEXAPRO) 20 MG tablet Take 20 mg by mouth in the morning. 04/27/14   [provider]  fenofibrate (TRICOR) 145 MG tablet Take 145 mg by mouth in the morning.    [provider]  furosemide (LASIX) 80  MG tablet Take 80 mg by mouth daily. 06/23/23   [provider]  gabapentin (NEURONTIN) 300 MG capsule Take 300 mg by mouth 3 (three) times daily. 08/11/22   [provider]  Insulin Pen Needle (B-D ULTRAFINE III SHORT PEN) 31G X 8 MM MISC Use as directed three time daily to inject insulin 08/03/23   Nida, Denman George, MD  insulin regular human CONCENTRATED (HUMULIN R U-500 KWIKPEN) 500 UNIT/ML KwikPen Inject 70 Units into the skin 3 (three) times daily with meals. Only when blood glucose is above 90 and eating. 08/09/23   Roma Kayser,  MD  isosorbide mononitrate (IMDUR) 120 MG 24 hr tablet Take 120 mg by mouth in the morning.    [provider]  Lancets [facility] DELICA PLUS LANCET33G) MISC USE TO check blood sugar FOUR TIMES DAILY AS DIRECTED 06/11/20   Roma Kayser, MD  lisinopril (ZESTRIL) 10 MG tablet Take 1 tablet (10 mg total) by mouth daily. 03/04/20   Roma Kayser, MD  meclizine (ANTIVERT) 25 MG tablet Take 25 mg by mouth every 6 (six) hours as needed for dizziness or nausea.    [provider]  metoprolol tartrate (LOPRESSOR) 100 MG tablet Take 1 tablet (100 mg total) by mouth 2 (two) times daily. OFFICE VISIT NEEDED BEFORE ADDITIONAL REFILLS 07/16/19   Antoine Poche, MD  modafinil (PROVIGIL) 200 MG tablet Take 200 mg by mouth daily. 09/11/19   [provider]  mupirocin ointment (BACTROBAN) 2 % Apply 1 Application topically 2 (two) times daily. 08/11/23   [provider]  nitroGLYCERIN (NITROLINGUAL) 0.4 MG/SPRAY spray Place 1 spray under the tongue every 5 (five) minutes x 3 doses as needed for chest pain. 01/30/19   Long, Arlyss Repress, MD  [facility] VERIO test strip USE TO check blood sugar FOUR TIMES DAILY AS DIRECTED 06/11/20   Roma Kayser, MD  pantoprazole (PROTONIX) 40 MG tablet Take 1 tablet (40 mg total) by mouth 2 (two) times daily before a meal. 08/01/23   Mahon, Frederik Schmidt, NP  polyethylene glycol powder (GLYCOLAX/MIRALAX) 17 GM/SCOOP powder Take 17 g once daily in 8 ounces of water. 08/01/23   Aida Raider, NP  potassium chloride SA (KLOR-CON M) 20 MEQ tablet Take 1 tablet (20 mEq total) by mouth daily. While Taking Lasix/Furosemide Patient taking differently: Take 20 mEq by mouth 2 (two) times daily. 10/14/22   Shon Hale, MD  psyllium (METAMUCIL) 58.6 % packet Take 1 packet by mouth daily. 08/01/23   Aida Raider, NP  rosuvastatin (CRESTOR) 20 MG tablet Take 20 mg by mouth in the morning.    [provider]  TRELEGY ELLIPTA  100-62.5-25 MCG/ACT AEPB Inhale 1 puff into the lungs daily. 06/07/23   Oretha Milch, MD      Allergies    Contrast media [iodinated contrast media] and Iohexol    Review of Systems   Review of Systems  Cardiovascular:  Positive for chest pain.  Gastrointestinal:  Positive for abdominal pain.  All other systems reviewed and are negative.   Physical Exam Updated Vital Signs BP 111/73   Pulse 78   Temp 97.8 F (36.6 C)   Resp (!) 23   SpO2 94%  Physical Exam Vitals and nursing note reviewed.   Gen: NAD Eyes: PERRL, EOMI HEENT: no oropharyngeal swelling Neck: trachea midline Resp: clear to auscultation bilaterally, tender over the right lower lateral chest wall with no bruising noted Card: RRR, no murmurs, rubs, or  gallops Abd: The patient is tender over the right upper quadrant without any guarding or rebound Extremities: no calf tenderness, no edema Vascular: 2+ radial pulses bilaterally, 2+ DP pulses bilaterally Skin: no rashes Psyc: acting appropriately   ED Results / Procedures / Treatments   Labs (all labs ordered are listed, but only abnormal results are displayed) Labs Reviewed  BASIC METABOLIC PANEL - Abnormal; Notable for the following components:      Result Value   Creatinine, Ser 1.25 (*)    All other components within normal limits  CBC - Abnormal; Notable for the following components:   WBC 14.1 (*)    All other components within normal limits    EKG EKG Interpretation Date/Time:  Monday August 29 2023 20:47:40 EDT Ventricular Rate:  77 PR Interval:  149 QRS Duration:  90 QT Interval:  414 QTC Calculation: 469 R Axis:   20  Text Interpretation: Sinus rhythm Abnormal R-wave progression, early transition Nonspecific repol abnormality, diffuse leads Confirmed by Beckey Downing [phone]) on 08/29/2023 11:20:22 PM  Radiology DG Ribs Unilateral W/Chest Right  Result Date: 08/29/2023 CLINICAL DATA:  Right-sided rib pain after mechanical fall EXAM:  RIGHT RIBS AND CHEST - 3+ VIEW COMPARISON:  12/29/2022, chest CT 06/2023 FINDINGS: Sternotomy with multiple fractured sternal wires as before. Distortion and scarring within the right mid lung. No acute airspace disease. Normal cardiac size. Right rib series demonstrates multiple chronic right-sided rib fractures. No definite acute displaced right rib fracture is seen. IMPRESSION: 1. No definite acute displaced right rib fracture. Multiple chronic right-sided rib fractures. 2. Distortion and scarring within the right mid lung. Electronically Signed   By: Jasmine Pang M.D.   On: 08/29/2023 20:24    Procedures Procedures  {Document cardiac monitor, telemetry assessment procedure when appropriate:1}  Medications Ordered in ED Medications  sodium chloride 0.9 % bolus 1,000 mL (1,000 mLs Intravenous New Bag/Given 08/29/23 2123)  ipratropium-albuterol (DUONEB) 0.5-2.5 (3) MG/3ML nebulizer solution 3 mL (3 mLs Nebulization Given 08/29/23 2232)    ED Course/ Medical Decision Making/ A&P   {   Click here for ABCD2, HEART and other calculatorsREFRESH Note before signing :1}                              Medical Decision Making 62 year old male with past medical history of atrial fibrillation on Eliquis, hypertension, and hyperlipidemia presenting to the emergency department today with chest wall pain and right upper quadrant abdominal pain after he fell and struck this area earlier today walking off his porch.  The patient is hypertensive here on arrival which he states is not out of the ordinary for him.  I will obtain basic labs here to evaluate for anemia.  Will obtain a CT scan of his abdomen to evaluate for liver injury.  The patient did have a chest x-ray performed at triage that does not show any obvious rib fractures or pneumothorax.  I will give patient IV fluids.  I will reevaluate for ultimate disposition.  Amount and/or Complexity of Data Reviewed Labs: ordered. Radiology:  ordered.  Risk Prescription drug management.   ***  {Document critical care time when appropriate:1} {Document review of labs and clinical decision tools ie heart score, Chads2Vasc2 etc:1}  {Document your independent review of radiology images, and any outside records:1} {Document your discussion with family members, caretakers, and with consultants:1} {Document social determinants of health affecting pt's care:1} {Document your decision making why or  why not admission, treatments were needed:1} Final Clinical Impression(s) / ED Diagnoses Final diagnoses:  None    Rx / DC Orders ED Discharge Orders     None

## 2023-08-29 NOTE — ED Notes (Signed)
Patient transported to CT 

## 2023-08-30 NOTE — ED Provider Notes (Signed)
  Provider Note MRN:  [id number]  Arrival date & time: 08/30/23    ED Course and Medical Decision Making  Assumed care from Dr. Rhae Hammock at shift change.  Fall with blunt trauma to the anterior abdomen, tender on exam, awaiting CT imaging.  Imaging is reassuring.  Patient is well-appearing on reassessment, sitting on the edge of the bed, wants to go home.  Has had some soft blood pressures but this seems to be his baseline.  Appropriate for discharge.  Procedures  Final Clinical Impressions(s) / ED Diagnoses     ICD-10-CM   1. Contusion of right chest wall, initial encounter  S20.211A       ED Discharge Orders     None         Discharge Instructions      Your workup today was reassuring.  Please take Tylenol as needed for pain.  Follow-up with your doctor.  Return to the ER for worsening symptoms.    Elmer Sow. Pilar Plate, MD [facility] Emergency Medicine [facility] [email or web address]    Sabas Sous, MD 08/30/23 [phone]

## 2023-08-30 NOTE — Discharge Instructions (Addendum)
Your workup today was reassuring.  Please take Tylenol as needed for pain.  Follow-up with your doctor.  Return to the ER for worsening symptoms.

## 2023-08-31 ENCOUNTER — Ambulatory Visit: Payer: Medicare HMO | Admitting: Pulmonary Disease

## 2023-09-01 ENCOUNTER — Ambulatory Visit: Payer: Medicare HMO | Admitting: Adult Health

## 2023-09-01 ENCOUNTER — Encounter: Payer: Self-pay | Admitting: Adult Health

## 2023-09-01 VITALS — BP 106/72 | HR 73 | Ht 70.0 in | Wt 313.0 lb

## 2023-09-01 DIAGNOSIS — I130 Hypertensive heart and chronic kidney disease with heart failure and stage 1 through stage 4 chronic kidney disease, or unspecified chronic kidney disease: Secondary | ICD-10-CM | POA: Diagnosis not present

## 2023-09-01 DIAGNOSIS — G4733 Obstructive sleep apnea (adult) (pediatric): Secondary | ICD-10-CM | POA: Diagnosis not present

## 2023-09-01 DIAGNOSIS — R0602 Shortness of breath: Secondary | ICD-10-CM

## 2023-09-01 DIAGNOSIS — J9611 Chronic respiratory failure with hypoxia: Secondary | ICD-10-CM

## 2023-09-01 DIAGNOSIS — E1100 Type 2 diabetes mellitus with hyperosmolarity without nonketotic hyperglycemic-hyperosmolar coma (NKHHC): Secondary | ICD-10-CM

## 2023-09-01 DIAGNOSIS — I5032 Chronic diastolic (congestive) heart failure: Secondary | ICD-10-CM

## 2023-09-01 DIAGNOSIS — J449 Chronic obstructive pulmonary disease, unspecified: Secondary | ICD-10-CM | POA: Diagnosis not present

## 2023-09-01 DIAGNOSIS — J441 Chronic obstructive pulmonary disease with (acute) exacerbation: Secondary | ICD-10-CM | POA: Diagnosis not present

## 2023-09-01 DIAGNOSIS — F17200 Nicotine dependence, unspecified, uncomplicated: Secondary | ICD-10-CM | POA: Diagnosis not present

## 2023-09-01 MED ORDER — IPRATROPIUM-ALBUTEROL 0.5-2.5 (3) MG/3ML IN SOLN
3.0000 mL | Freq: Once | RESPIRATORY_TRACT | Status: AC
  Administered 2023-09-01: 3 mL via RESPIRATORY_TRACT

## 2023-09-01 MED ORDER — METHYLPREDNISOLONE ACETATE 80 MG/ML IJ SUSP
80.0000 mg | Freq: Once | INTRAMUSCULAR | Status: AC
  Administered 2023-09-01: 80 mg via INTRAMUSCULAR

## 2023-09-01 MED ORDER — BENZONATATE 200 MG PO CAPS: 200.0000 mg | ORAL_CAPSULE | Freq: Three times a day (TID) | ORAL | 1 refills | Status: DC | PRN

## 2023-09-01 MED ORDER — PREDNISONE 10 MG PO TABS: ORAL_TABLET | ORAL | 0 refills | Status: DC

## 2023-09-01 NOTE — Progress Notes (Signed)
@Patient  ID: [name], male    DOB: October 27, 1961, 62 y.o.   MRN: [id number]  Chief Complaint  Patient presents with   Follow-up    Referring provider: Assunta Found, MD  HPI: 62 yo male active smoker followed for COPD , OSA and Presumed lung cancer (enlarging RUL nodule ) s/p SBRT and Chronic respiratory failure on O2  Medical history significant for CAD s/p CABG , PAF on Eliquis, DM on Insulin  , DCHF   TEST/EVENTS :  CT chest 06/2023 post XRT scarring RUL  stable scattered lung nodules   2021 PFT Moderate airflow obstruction   09/01/2023 Acute OV : COPD, ER follow up  Patient presents for an acute office visit.  Patient complained over the last 4 days he has had increased cough, shortness of breath, wheezing.  Patient says he tripped and fell at home.  Had significant soreness along his chest.  Patient went to the emergency room on August 29, 2023.  ER records were reviewed.  Chest x-ray with rib detail and CT abdomen pelvis showed chronic changes with no acute process.  Patient says he has been getting progressively worse.  Patient has moderate COPD and chronic respiratory failure.  He is supposed to be on oxygen 24/7.  Patient said he only uses his oxygen at night.  He did get a portable oxygen concentrator last visit in April 2024 but said he did not feel like it worked at all.  So he sent it back to the homecare company.  Patient has sleep apnea and is on nocturnal CPAP with oxygen at 3 L.  Today in the office patient arrived in respiratory distress with increased work of breathing.  O2 saturations were 79% on room air.  Patient required 4 L of oxygen to get O2 saturations above 90%.  Patient was given a DuoNeb treatment in office.  Patient says he has DuoNeb at home but has no nebulizer tubing.  Patient does continue to smoke.  Patient is on CPAP at nighttime. Uses every night and with naps. CPAP download shows excellent compliance with daily average usage at 10.5 hr , AHI 0.6 .      Allergies  Allergen Reactions   Contrast Media [Iodinated Contrast Media] Other (See Comments)    Pt must be premedicated before given contrast media - stops heart   Iohexol Other (See Comments)     Consult with radiologist before pre meds are given.Desc: PT. STATES HEART STOPPED HAS TO BE PREMED.     Immunization History  Administered Date(s) Administered   Influenza Inj Mdck Quad Pf 09/25/2018   Influenza Split 08/23/2014, 10/23/2015   Influenza,inj,Quad PF,6+ Mos 08/03/2016, 07/10/2019, 09/22/2020, 09/14/2021, 07/31/2022    Past Medical History:  Diagnosis Date   Abnormal myocardial perfusion study 01/01/2011   there a small to moderate sized inferobasal scar   Barrett's esophagus    Cataracts, bilateral    Chronic kidney disease    hx of kidney stones   Claudication (HCC) 11/16/2011   PV test perform shows normal   COPD (chronic obstructive pulmonary disease) (HCC)    Depression    Diabetes (HCC)    type 2 diabetes mellitus   Dysrhythmia    GERD (gastroesophageal reflux disease)    Glaucoma    Hernia of abdominal wall    History of radiation therapy    Right lung, SBRT- 04/16/21-04/30/21- Dr. Antony Blackbird   HTN (hypertension)    Hyperlipidemia    Morbid obesity (HCC)  Myocardial infarction (HCC) 2008,2009,2009   OSA (obstructive sleep apnea)    on cpap   PAF (paroxysmal atrial fibrillation) (HCC)    S/P colonoscopy 2009   3-4 mm transverse colon erosions likely secondary to  ASA   S/P endoscopy 10/2010   moderate erosive gastritis, Barrett's esophagus 1-2cm   Sleep apnea    SOB (shortness of breath) 11/03/2007   2D Echo EF 50%-55%   Stroke (HCC) 2017    Tobacco History: Social History   Tobacco Use  Smoking Status Every Day   Current packs/day: 0.50   Average packs/day: 0.5 packs/day for 30.0 years (15.0 ttl pk-yrs)   Types: Cigarettes  Smokeless Tobacco Never  Tobacco Comments   Smokes 0.25 packs per day [facility], 12/29/22   Ready to quit: Not  Answered Counseling given: Not Answered Tobacco comments: Smokes 0.25 packs per day [facility], 12/29/22   Outpatient Medications Prior to Visit  Medication Sig Dispense Refill   acetaminophen (TYLENOL) 500 MG tablet Take 500 mg by mouth every 6 (six) hours as needed for moderate pain.     albuterol (PROVENTIL) (2.5 MG/3ML) 0.083% nebulizer solution Take 3 mLs (2.5 mg total) by nebulization every 6 (six) hours as needed for wheezing or shortness of breath. 75 mL 12   albuterol (VENTOLIN HFA) 108 (90 Base) MCG/ACT inhaler Inhale 1-2 puffs into the lungs every 6 (six) hours as needed for wheezing or shortness of breath. 8.5 g 3   apixaban (ELIQUIS) 5 MG TABS tablet Take 1 tablet (5 mg total) by mouth 2 (two) times daily. 60 tablet 1   blood glucose meter kit and supplies Dispense based on patient and insurance preference. Use up to two times daily as directed. (FOR ICD-E11.65) 1 each 5   Blood Glucose Monitoring Suppl (ONETOUCH VERIO) w/Device KIT 1 each by Does not apply route as needed. 1 kit 0   Cholecalciferol (VITAMIN D) 50 MCG (2000 UT) tablet Take 2,000 Units by mouth daily.     Continuous Glucose Sensor (FREESTYLE LIBRE 2 SENSOR) MISC Use to check glucose four times daily 6 each 0   Cyanocobalamin (VITAMIN B-12 IJ) Inject 1,000 mcg as directed every 30 (thirty) days.     diazepam (VALIUM) 5 MG tablet Take 5 mg by mouth 2 (two) times daily.     diclofenac Sodium (VOLTAREN) 1 % GEL Apply 1 Application topically 4 (four) times daily as needed (pain).     Dulaglutide (TRULICITY) 3 MG/0.5ML SOPN Inject 3 mg as directed once a week. 2 mL 2   escitalopram (LEXAPRO) 20 MG tablet Take 20 mg by mouth in the morning.     fenofibrate (TRICOR) 145 MG tablet Take 145 mg by mouth in the morning.     furosemide (LASIX) 80 MG tablet Take 80 mg by mouth daily.     gabapentin (NEURONTIN) 300 MG capsule Take 300 mg by mouth 3 (three) times daily.     Insulin Pen Needle (B-D ULTRAFINE III SHORT PEN) 31G X 8 MM MISC  Use as directed three time daily to inject insulin 300 each 1   insulin regular human CONCENTRATED (HUMULIN R U-500 KWIKPEN) 500 UNIT/ML KwikPen Inject 70 Units into the skin 3 (three) times daily with meals. Only when blood glucose is above 90 and eating. 36 mL 0   isosorbide mononitrate (IMDUR) 120 MG 24 hr tablet Take 120 mg by mouth in the morning.     Lancets (ONETOUCH DELICA PLUS LANCET33G) MISC USE TO check blood sugar FOUR TIMES  DAILY AS DIRECTED 200 each 1   lisinopril (ZESTRIL) 10 MG tablet Take 1 tablet (10 mg total) by mouth daily. 90 tablet 1   meclizine (ANTIVERT) 25 MG tablet Take 25 mg by mouth every 6 (six) hours as needed for dizziness or nausea.     metoprolol tartrate (LOPRESSOR) 100 MG tablet Take 1 tablet (100 mg total) by mouth 2 (two) times daily. OFFICE VISIT NEEDED BEFORE ADDITIONAL REFILLS 28 tablet 0   modafinil (PROVIGIL) 200 MG tablet Take 200 mg by mouth daily.     mupirocin ointment (BACTROBAN) 2 % Apply 1 Application topically 2 (two) times daily.     nitroGLYCERIN (NITROLINGUAL) 0.4 MG/SPRAY spray Place 1 spray under the tongue every 5 (five) minutes x 3 doses as needed for chest pain. 12 g 0   ONETOUCH VERIO test strip USE TO check blood sugar FOUR TIMES DAILY AS DIRECTED 200 strip 1   pantoprazole (PROTONIX) 40 MG tablet Take 1 tablet (40 mg total) by mouth 2 (two) times daily before a meal. 180 tablet 3   polyethylene glycol powder (GLYCOLAX/MIRALAX) 17 GM/SCOOP powder Take 17 g once daily in 8 ounces of water. 238 g 0   potassium chloride SA (KLOR-CON M) 20 MEQ tablet Take 1 tablet (20 mEq total) by mouth daily. While Taking Lasix/Furosemide (Patient taking differently: Take 20 mEq by mouth 2 (two) times daily.) 30 tablet 0   psyllium (METAMUCIL) 58.6 % packet Take 1 packet by mouth daily. 30 each 12   rosuvastatin (CRESTOR) 20 MG tablet Take 20 mg by mouth in the morning.     TRELEGY ELLIPTA 100-62.5-25 MCG/ACT AEPB Inhale 1 puff into the lungs daily. 60 each 2    No facility-administered medications prior to visit.     Review of Systems:   Constitutional:   No  weight loss, night sweats,  Fevers, chills, +fatigue, or  lassitude.  HEENT:   No headaches,  Difficulty swallowing,  Tooth/dental problems, or  Sore throat,                No sneezing, itching, ear ache, nasal congestion, post nasal drip,   CV:  No chest pain,  Orthopnea, PND, swelling in lower extremities, anasarca, dizziness, palpitations, syncope.   GI  No heartburn, indigestion, abdominal pain, nausea, vomiting, diarrhea, change in bowel habits, loss of appetite, bloody stools.   Resp:  No chest wall deformity  Skin: no rash or lesions.  GU: no dysuria, change in color of urine, no urgency or frequency.  No flank pain, no hematuria   MS:  No joint pain or swelling.  No decreased range of motion.  No back pain.+ right rib pain     Physical Exam  BP 106/72   Pulse 73   Ht 5\' 10"  (1.778 m)   Wt (!) 313 lb (142 kg)   SpO2 93% Comment: with 4L of O2  BMI 44.91 kg/m   GEN: A/Ox3; pleasant , cane   HEENT:  [place]/AT,  EACs-clear, TMs-wnl, NOSE-clear, THROAT-clear, no lesions, no postnasal drip or exudate noted.   NECK:  Supple w/ fair ROM; no JVD; normal carotid impulses w/o bruits; no thyromegaly or nodules palpated; no lymphadenopathy.    RESP  bilateral exp wheezing  no accessory muscle use, no dullness to percussion, speaks in full sentences   CARD:  RRR, no m/r/g, tr  peripheral edema, pulses intact, no cyanosis or clubbing.  GI:   Soft & nt; nml bowel sounds; no organomegaly or masses  detected.   Musco: Warm bil, no deformities or joint swelling noted.   Neuro: alert, no focal deficits noted.    Skin: Warm, no lesions or rashes    Lab Results:  CBC   BNP    Component Value Date/Time   BNP 19.0 10/12/2022 1021    ProBNP    Component Value Date/Time   PROBNP 27.1 03/15/2014 1805    Imaging: CT ABDOMEN PELVIS WO CONTRAST  Result Date:  08/30/2023 CLINICAL DATA:  Blunt trauma, urologic injury suspected. Right rib pain. EXAM: CT ABDOMEN AND PELVIS WITHOUT CONTRAST TECHNIQUE: Multidetector CT imaging of the abdomen and pelvis was performed following the standard protocol without IV contrast. RADIATION DOSE REDUCTION: This exam was performed according to the departmental dose-optimization program which includes automated exposure control, adjustment of the mA and/or kV according to patient size and/or use of iterative reconstruction technique. COMPARISON:  None Available. FINDINGS: Lower chest: No acute abnormality. Hepatobiliary: No hepatic injury or perihepatic hematoma. Gallbladder is unremarkable Pancreas: Unremarkable. No pancreatic ductal dilatation or surrounding inflammatory changes. Spleen: No splenic injury or perisplenic hematoma. Adrenals/Urinary Tract: Limited by respiratory motion artifact. Likely small cyst in the inferior pole the right kidney measuring 11 mm. No acute renal injury identified. No hydronephrosis or perinephric fluid. The adrenal glands, ureters and bladder are within normal limits. Stomach/Bowel: Stomach is within normal limits. Appendix appears normal. No evidence of bowel wall thickening, distention, or inflammatory changes. Vascular/Lymphatic: Aortic atherosclerosis. No enlarged abdominal or pelvic lymph nodes. Reproductive: Prostate is unremarkable. Other: There small fat containing inguinal hernias. There is no free fluid. There is no body wall hematoma identified. Musculoskeletal: No acute fractures are seen. Degenerative changes affect the spine. IMPRESSION: 1. No acute posttraumatic sequelae in the abdomen or pelvis. Aortic Atherosclerosis (ICD10-I70.0). Electronically Signed   By: Darliss Cheney M.D.   On: 08/30/2023 00:43   DG Ribs Unilateral W/Chest Right  Result Date: 08/29/2023 CLINICAL DATA:  Right-sided rib pain after mechanical fall EXAM: RIGHT RIBS AND CHEST - 3+ VIEW COMPARISON:  12/29/2022, chest  CT 06/2023 FINDINGS: Sternotomy with multiple fractured sternal wires as before. Distortion and scarring within the right mid lung. No acute airspace disease. Normal cardiac size. Right rib series demonstrates multiple chronic right-sided rib fractures. No definite acute displaced right rib fracture is seen. IMPRESSION: 1. No definite acute displaced right rib fracture. Multiple chronic right-sided rib fractures. 2. Distortion and scarring within the right mid lung. Electronically Signed   By: Jasmine Pang M.D.   On: 08/29/2023 20:24    ipratropium-albuterol (DUONEB) 0.5-2.5 (3) MG/3ML nebulizer solution 3 mL     Date Action Dose Route User   09/01/2023 1420 Given 3 mL Nebulization (Mouth) Annabell Sabal, CMA      methylPREDNISolone acetate (DEPO-MEDROL) injection 80 mg     Date Action Dose Route User   09/01/2023 1449 Given 80 mg Intramuscular (Left Deltoid) Annabell Sabal, CMA          Latest Ref Rng & Units 03/20/2020    3:38 PM  PFT Results  FVC-Pre L 2.70  P  FVC-Predicted Pre % 55  P  FVC-Post L 2.75  P  FVC-Predicted Post % 56  P  Pre FEV1/FVC % % 70  P  Post FEV1/FCV % % 74  P  FEV1-Pre L 1.90  P  FEV1-Predicted Pre % 51  P  FEV1-Post L 2.04  P  DLCO uncorrected ml/min/mmHg 16.47  P  DLCO UNC% % 58  P  DLCO corrected  ml/min/mmHg 16.47  P  DLCO COR %Predicted % 58  P  DLVA Predicted % 85  P  TLC L 5.35  P  TLC % Predicted % 76  P  RV % Predicted % 112  P    P Preliminary result    No results found for: "NITRICOXIDE"      Assessment & Plan:   COPD with acute exacerbation (HCC) Severe COPD exacerbation.  Patient is high risk for decompensation.  He has acute on chronic respiratory failure.  He is noncompliant with his oxygen.  Today in the office he arrived in respiratory distress with significant hypoxemia with O2 saturations at 79% Patient was recommended for referral to the emergency room via EMS for ongoing evaluation and consideration of  hospitalization.  Patient's recently had chest wall contusion with emergency room evaluation earlier this week.  There was no acute process found on rib films and CT abdomen pelvis however patient has pleuritic pain with coughing . Patient adamantly declined multiple times.  Long discussion with him regarding his potential for decompensation I explained to him that this could continue to worsen and potential complications of hypoxemia. Patient refused to go to the emergency room. Patient was started on oxygen and required 4 L of oxygen to maintain O2 saturations greater than 88 to 90%.  He was given a DuoNeb nebulizer in the office.  He was given tubing to take home as he does not have any.  He is continue on Trelegy.  He was given a Depo-Medrol 80 mg IM injection today in the office.  Wheezing did improve after neb treatment.  O2 saturations were adequate at 93% on 4 L.  He was given a oxygen tank to take home with him.  Also we have contacted his DME company to supply a portable oxygen tanks at home for him. He is to begin a prednisone taper tomorrow.  Patient does have insulin-dependent diabetes.  Explained to him it will be hard to manage his sugars in the outpatient setting with steroids.  Patient continued to decline hospitalization.  Advise it if his blood sugars become greater than 250 he is to contact us or his primary care to adjust his insulin dosing. Patient is vies if symptoms worsen to please go to the emergency room.  Plan  Patient Instructions  I recommended to go to the emergency room to be evaluated and consider for hospitalization as you have a severe COPD exacerbation that is affecting your oxygen levels you are at risk for further decline.  Prednisone taper over the next week- begin tomorrow.  Call if you blood sugars >250.  You need to wear your oxygen 24/7.  Begin oxygen 4 L during the daytime and continue 3 L with CPAP at bedtime Order for portable oxygen tanks at  home. Continue on Trelegy inhaler.  Rinse after use Albuterol or DuoNeb as needed Order for nebulizer tubing to DME. Discussed with primary care provider that lisinopril may be aggravating your cough. Delsym 2 tsp Twice daily  for cough As needed   Tessalon Three times a day  for cough As needed    Continue on CPAP At bedtime, wear all night long .   Warm heat to ribs   Follow up with Dr. Sherene Sires in 4-6 weeks and As needed   Please contact office for sooner follow up if symptoms do not improve or worsen or seek emergency care         Chronic respiratory failure with hypoxia (  HCC) Patient is encouraged to wear his oxygen at all times.  He is to use 4 L of oxygen during the daytime on continuous flow.  And 3 L at nighttime with CPAP.  Diastolic congestive heart failure, NYHA class 2 (HCC) Appears euvolemic on exam.  Continue current management and follow-up with cardiology  DM hyperosmolarity type II, uncontrolled (HCC) Continue to monitor blood sugars closely if become over 250 he is to contact his primary care provider or endocrinologist to adjust his insulin.  Current smoker Smoking cessation discussed in detail  OSA (obstructive sleep apnea) Excellent control and compliance on nocturnal CPAP with oxygen  Plan  Patient Instructions  I recommended to go to the emergency room to be evaluated and consider for hospitalization as you have a severe COPD exacerbation that is affecting your oxygen levels you are at risk for further decline.  Prednisone taper over the next week- begin tomorrow.  Call if you blood sugars >250.  You need to wear your oxygen 24/7.  Begin oxygen 4 L during the daytime and continue 3 L with CPAP at bedtime Order for portable oxygen tanks at home. Continue on Trelegy inhaler.  Rinse after use Albuterol or DuoNeb as needed Order for nebulizer tubing to DME. Discussed with primary care provider that lisinopril may be aggravating your cough. Delsym 2 tsp  Twice daily  for cough As needed   Tessalon Three times a day  for cough As needed    Continue on CPAP At bedtime, wear all night long .   Warm heat to ribs   Follow up with Dr. Sherene Sires in 4-6 weeks and As needed   Please contact office for sooner follow up if symptoms do not improve or worsen or seek emergency care           I spent   53 minutes dedicated to the care of this patient on the date of this encounter to include pre-visit review of records, face-to-face time with the patient discussing conditions above, post visit ordering of testing, clinical documentation with the electronic health record, making appropriate referrals as documented, and communicating necessary findings to members of the patients care team.   Rubye Oaks, NP 09/01/2023

## 2023-09-01 NOTE — Patient Instructions (Addendum)
I recommended to go to the emergency room to be evaluated and consider for hospitalization as you have a severe COPD exacerbation that is affecting your oxygen levels you are at risk for further decline.  Prednisone taper over the next week- begin tomorrow.  Call if you blood sugars >250.  You need to wear your oxygen 24/7.  Begin oxygen 4 L during the daytime and continue 3 L with CPAP at bedtime Order for portable oxygen tanks at home. Continue on Trelegy inhaler.  Rinse after use Albuterol or DuoNeb as needed Order for nebulizer tubing to DME. Discussed with primary care provider that lisinopril may be aggravating your cough. Delsym 2 tsp Twice daily  for cough As needed   Tessalon Three times a day  for cough As needed    Continue on CPAP At bedtime, wear all night long .   Warm heat to ribs   Follow up with Dr. Sherene Sires in 4-6 weeks and As needed   Please contact office for sooner follow up if symptoms do not improve or worsen or seek emergency care

## 2023-09-01 NOTE — Assessment & Plan Note (Signed)
Severe COPD exacerbation.  Patient is high risk for decompensation.  He has acute on chronic respiratory failure.  He is noncompliant with his oxygen.  Today in the office he arrived in respiratory distress with significant hypoxemia with O2 saturations at 79% Patient was recommended for referral to the emergency room via EMS for ongoing evaluation and consideration of hospitalization.  Patient's recently had chest wall contusion with emergency room evaluation earlier this week.  There was no acute process found on rib films and CT abdomen pelvis however patient has pleuritic pain with coughing . Patient adamantly declined multiple times.  Long discussion with him regarding his potential for decompensation I explained to him that this could continue to worsen and potential complications of hypoxemia. Patient refused to go to the emergency room. Patient was started on oxygen and required 4 L of oxygen to maintain O2 saturations greater than 88 to 90%.  He was given a DuoNeb nebulizer in the office.  He was given tubing to take home as he does not have any.  He is continue on Trelegy.  He was given a Depo-Medrol 80 mg IM injection today in the office.  Wheezing did improve after neb treatment.  O2 saturations were adequate at 93% on 4 L.  He was given a oxygen tank to take home with him.  Also we have contacted his DME company to supply a portable oxygen tanks at home for him. He is to begin a prednisone taper tomorrow.  Patient does have insulin-dependent diabetes.  Explained to him it will be hard to manage his sugars in the outpatient setting with steroids.  Patient continued to decline hospitalization.  Advise it if his blood sugars become greater than 250 he is to contact us or his primary care to adjust his insulin dosing. Patient is vies if symptoms worsen to please go to the emergency room.  Plan  Patient Instructions  I recommended to go to the emergency room to be evaluated and consider for  hospitalization as you have a severe COPD exacerbation that is affecting your oxygen levels you are at risk for further decline.  Prednisone taper over the next week- begin tomorrow.  Call if you blood sugars >250.  You need to wear your oxygen 24/7.  Begin oxygen 4 L during the daytime and continue 3 L with CPAP at bedtime Order for portable oxygen tanks at home. Continue on Trelegy inhaler.  Rinse after use Albuterol or DuoNeb as needed Order for nebulizer tubing to DME. Discussed with primary care provider that lisinopril may be aggravating your cough. Delsym 2 tsp Twice daily  for cough As needed   Tessalon Three times a day  for cough As needed    Continue on CPAP At bedtime, wear all night long .   Warm heat to ribs   Follow up with Dr. Sherene Sires in 4-6 weeks and As needed   Please contact office for sooner follow up if symptoms do not improve or worsen or seek emergency care

## 2023-09-01 NOTE — Assessment & Plan Note (Signed)
Appears euvolemic on exam.  Continue current management and follow-up with cardiology

## 2023-09-01 NOTE — Assessment & Plan Note (Signed)
Continue to monitor blood sugars closely if become over 250 he is to contact his primary care provider or endocrinologist to adjust his insulin.

## 2023-09-01 NOTE — Assessment & Plan Note (Signed)
Smoking cessation discussed in detail 

## 2023-09-01 NOTE — Assessment & Plan Note (Signed)
Patient is encouraged to wear his oxygen at all times.  He is to use 4 L of oxygen during the daytime on continuous flow.  And 3 L at nighttime with CPAP.

## 2023-09-01 NOTE — Assessment & Plan Note (Signed)
Excellent control and compliance on nocturnal CPAP with oxygen  Plan  Patient Instructions  I recommended to go to the emergency room to be evaluated and consider for hospitalization as you have a severe COPD exacerbation that is affecting your oxygen levels you are at risk for further decline.  Prednisone taper over the next week- begin tomorrow.  Call if you blood sugars >250.  You need to wear your oxygen 24/7.  Begin oxygen 4 L during the daytime and continue 3 L with CPAP at bedtime Order for portable oxygen tanks at home. Continue on Trelegy inhaler.  Rinse after use Albuterol or DuoNeb as needed Order for nebulizer tubing to DME. Discussed with primary care provider that lisinopril may be aggravating your cough. Delsym 2 tsp Twice daily  for cough As needed   Tessalon Three times a day  for cough As needed    Continue on CPAP At bedtime, wear all night long .   Warm heat to ribs   Follow up with Dr. Sherene Sires in 4-6 weeks and As needed   Please contact office for sooner follow up if symptoms do not improve or worsen or seek emergency care

## 2023-09-05 DIAGNOSIS — J449 Chronic obstructive pulmonary disease, unspecified: Secondary | ICD-10-CM | POA: Diagnosis not present

## 2023-09-19 DIAGNOSIS — J449 Chronic obstructive pulmonary disease, unspecified: Secondary | ICD-10-CM | POA: Diagnosis not present

## 2023-09-19 DIAGNOSIS — G4733 Obstructive sleep apnea (adult) (pediatric): Secondary | ICD-10-CM | POA: Diagnosis not present

## 2023-09-19 DIAGNOSIS — E1159 Type 2 diabetes mellitus with other circulatory complications: Secondary | ICD-10-CM | POA: Diagnosis not present

## 2023-09-28 ENCOUNTER — Telehealth: Payer: Self-pay | Admitting: "Endocrinology

## 2023-09-28 ENCOUNTER — Other Ambulatory Visit: Payer: Self-pay

## 2023-09-28 DIAGNOSIS — E1159 Type 2 diabetes mellitus with other circulatory complications: Secondary | ICD-10-CM

## 2023-09-28 MED ORDER — FREESTYLE LIBRE 2 SENSOR MISC: 3 refills | Status: DC

## 2023-09-28 NOTE — Telephone Encounter (Signed)
Sent to [facility]. This is where [facility] pt assistance refills go to.

## 2023-09-28 NOTE — Telephone Encounter (Signed)
Pt is asking if you can call [facility] and get his Sensors sent to him. Do you normally do this?

## 2023-10-01 DIAGNOSIS — E1140 Type 2 diabetes mellitus with diabetic neuropathy, unspecified: Secondary | ICD-10-CM | POA: Diagnosis not present

## 2023-10-01 DIAGNOSIS — I480 Paroxysmal atrial fibrillation: Secondary | ICD-10-CM | POA: Diagnosis not present

## 2023-10-01 DIAGNOSIS — I5040 Unspecified combined systolic (congestive) and diastolic (congestive) heart failure: Secondary | ICD-10-CM | POA: Diagnosis not present

## 2023-10-01 DIAGNOSIS — J449 Chronic obstructive pulmonary disease, unspecified: Secondary | ICD-10-CM | POA: Diagnosis not present

## 2023-10-01 DIAGNOSIS — D6869 Other thrombophilia: Secondary | ICD-10-CM | POA: Diagnosis not present

## 2023-10-02 NOTE — Progress Notes (Unsigned)
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   No obvious patterns in day to day or daytime  variability or assoc excess/ purulent sputum or mucus plugs or hemoptysis or chest tightness, subjective wheeze or overt sinus or hb symptoms.    Also denies any obvious fluctuation of symptoms with weather or environmental changes or other aggravating or alleviating factors except as outlined above   No unusual exposure hx or h/o childhood pna/ asthma or knowledge of premature birth.  Current Allergies, Complete Past Medical History, Past Surgical History, Family History, and Social History were reviewed in [name] record.  ROS  The following are not active complaints unless bolded Hoarseness, sore throat, dysphagia= globus sensation , dental problems, itching, sneezing,  nasal congestion or discharge of excess mucus or purulent secretions, ear ache,   fever, chills, sweats, unintended wt loss or wt gain, classically pleuritic or exertional cp,  orthopnea pnd or arm/hand swelling  or leg swelling, presyncope,  palpitations, abdominal pain, anorexia, nausea, vomiting, diarrhea  or change in bowel habits or change in bladder habits, change in stools or change in urine, dysuria, hematuria,  rash, arthralgias esp R hip radicular , visual complaints, headache, numbness, weakness or ataxia or problems with walking/ uses suction cane  or coordination,  change in mood or  memory.              Outpatient Medications Prior to Visit - - NOTE:   Unable to verify as accurately reflecting what pt takes    Medication Sig Dispense Refill   acetaminophen (TYLENOL) 500 MG tablet Take 500 mg by mouth every 6 (six) hours as needed for moderate pain.     albuterol (VENTOLIN HFA) 108 (90 Base) MCG/ACT inhaler Inhale 1-2 puffs into the lungs every 6 (six) hours as needed for wheezing or shortness of breath. 8.5 g 3   apixaban (ELIQUIS) 5 MG TABS tablet Take 1 tablet (5 mg total) by mouth 2 (two) times daily. 60 tablet 1   blood glucose meter kit and supplies Dispense based on patient and insurance preference. Use up to two times daily as directed. (FOR ICD-E11.65) 1 each 5   Blood Glucose Monitoring Suppl (ONETOUCH VERIO) w/Device KIT 1 each by Does not apply route as needed. 1 kit 0   Cholecalciferol (VITAMIN D) 50 MCG (2000 UT)  tablet Take 2,000 Units by mouth daily.     Continuous Glucose Sensor (FREESTYLE LIBRE 2 SENSOR) MISC Use to check glucose four times daily 6 each 3   Cyanocobalamin (VITAMIN B-12 IJ) Inject 1,000 mcg as directed every 30 (thirty) days.     diazepam (VALIUM) 5 MG tablet Take 5 mg by mouth 2 (two) times daily.     diclofenac Sodium (VOLTAREN) 1 % GEL Apply 1 Application topically 4 (four) times daily as needed (pain).     Dulaglutide (TRULICITY) 3 MG/0.5ML SOPN Inject 3 mg as directed once a week. 2 mL 2   DULoxetine (CYMBALTA) 30 MG capsule Take 30 mg by mouth daily.     escitalopram (LEXAPRO) 20 MG tablet Take 20 mg by mouth in the morning.     fenofibrate (TRICOR) 145 MG tablet Take 145 mg by  mouth in the morning.     furosemide (LASIX) 40 MG tablet Take 40 mg by mouth daily as needed.     gabapentin (NEURONTIN) 300 MG capsule Take 300 mg by mouth 3 (three) times daily.     Insulin Pen Needle (B-D ULTRAFINE III SHORT PEN) 31G X 8 MM MISC Use as directed three time daily to inject insulin 300 each 1   insulin regular human CONCENTRATED (HUMULIN R U-500 KWIKPEN) 500 UNIT/ML KwikPen Inject 70 Units into the skin 3 (three) times daily with meals. Only when blood glucose is above 90 and eating. 36 mL 0   ipratropium-albuterol (DUONEB) 0.5-2.5 (3) MG/3ML SOLN Take 3 mLs by nebulization every 6 (six) hours as needed.     isosorbide mononitrate (IMDUR) 120 MG 24 hr tablet Take 120 mg by mouth in the morning.     Lancets (ONETOUCH DELICA PLUS LANCET33G) MISC USE TO check blood sugar FOUR TIMES DAILY AS DIRECTED 200 each 1   lisinopril (ZESTRIL) 10 MG tablet Take 1 tablet (10 mg total) by mouth daily. 90 tablet 1   meclizine (ANTIVERT) 25 MG tablet Take 25 mg by mouth every 6 (six) hours as needed for dizziness or nausea.     metoprolol tartrate (LOPRESSOR) 100 MG tablet Take 1 tablet (100 mg total) by mouth 2 (two) times daily. OFFICE VISIT NEEDED BEFORE ADDITIONAL REFILLS 28 tablet 0   modafinil (PROVIGIL) 200 MG tablet Take 200 mg by mouth daily.     mupirocin ointment (BACTROBAN) 2 % Apply 1 Application topically 2 (two) times daily.     nitroGLYCERIN (NITROLINGUAL) 0.4 MG/SPRAY spray Place 1 spray under the tongue every 5 (five) minutes x 3 doses as needed for chest pain. 12 g 0   ONETOUCH VERIO test strip USE TO check blood sugar FOUR TIMES DAILY AS DIRECTED 200 strip 1   pantoprazole (PROTONIX) 40 MG tablet Take 1 tablet (40 mg total) by mouth 2 (two) times daily before a meal. 180 tablet 3   polyethylene glycol powder (GLYCOLAX/MIRALAX) 17 GM/SCOOP powder Take 17 g once daily in 8 ounces of water. 238 g 0   potassium chloride SA (KLOR-CON M) 20 MEQ tablet Take 1 tablet (20 mEq total) by  mouth daily. While Taking Lasix/Furosemide (Patient taking differently: Take 20 mEq by mouth 2 (two) times daily.) 30 tablet 0   psyllium (METAMUCIL) 58.6 % packet Take 1 packet by mouth daily. 30 each 12   rosuvastatin (CRESTOR) 20 MG tablet Take 20 mg by mouth in the morning.     TRELEGY ELLIPTA 100-62.5-25 MCG/ACT AEPB Inhale 1 puff into the lungs daily. 60 each 2  albuterol (PROVENTIL) (2.5 MG/3ML) 0.083% nebulizer solution Take 3 mLs (2.5 mg total) by nebulization every 6 (six) hours as needed for wheezing or shortness of breath. 75 mL 12   benzonatate (TESSALON) 200 MG capsule Take 1 capsule (200 mg total) by mouth 3 (three) times daily as needed. 45 capsule 1   furosemide (LASIX) 80 MG tablet Take 80 mg by mouth daily.     predniSONE (DELTASONE) 10 MG tablet 4 tabs for 2 days, then 3 tabs for 2 days, 2 tabs for 2 days, then 1 tab for 2 days, then stop 20 tablet 0   No facility-administered medications prior to visit.    Past Medical History:  Diagnosis Date   Abnormal myocardial perfusion study 01/01/2011   there a small to moderate sized inferobasal scar   Barrett's esophagus    Cataracts, bilateral    Chronic kidney disease    hx of kidney stones   Claudication (HCC) 11/16/2011   PV test perform shows normal   COPD (chronic obstructive pulmonary disease) (HCC)    Depression    Diabetes (HCC)    type 2 diabetes mellitus   Dysrhythmia    GERD (gastroesophageal reflux disease)    Glaucoma    Hernia of abdominal wall    History of radiation therapy    Right lung, SBRT- 04/16/21-04/30/21- Ronnie. Antony [facility]   HTN (hypertension)    Hyperlipidemia    Morbid obesity (HCC)    Myocardial infarction (HCC) 2008,2009,2009   OSA (obstructive sleep apnea)    on cpap   PAF (paroxysmal atrial fibrillation) (HCC)    S/P colonoscopy 2009   3-4 mm transverse colon erosions likely secondary to  ASA   S/P endoscopy 10/2010   moderate erosive gastritis, Barrett's esophagus 1-2cm   Sleep  apnea    SOB (shortness of breath) 11/03/2007   2D Echo EF 50%-55%   Stroke (HCC) 2017      Objective:     BP 110/74   Pulse 87   Ht 5\' 10"  (1.778 m)   Wt (!) 305 lb (138.3 kg)   SpO2 91%   BMI 43.76 kg/m   SpO2: 91 %  RA  MO ( by bmi) hoarse  amb wm very slowed mentation, easily confused with details of care    HEENT : Oropharynx  clear/     Nasal turbinates nl    NECK :  without  apparent JVD/ palpable Nodes/TM - very prominent pseudowheeze    LUNGS: no acc muscle use,  Nl contour chest which (other than transmitted breath sounds from upper airway ) is clear to Auscultation   bilaterally without cough on insp or exp maneuvers   CV:  RRR  no s3 or murmur or increase in P2, and no edema   ABD:  quite obese but soft and nontender    MS:  Nl gait/ ext warm without deformities Or obvious joint restrictions  calf tenderness, cyanosis or clubbing    SKIN: warm and dry without lesions    NEURO:  alert, approp, nl sensorium with  no motor or cerebellar deficits apparent.    I personally reviewed images and agree with radiology impression as follows:   Chest CT w/o contrast   06/09/23  1. Post radiation scarring in the right upper lobe, stable. 2. Multiple scattered pulmonary nodules, stable dating back to at least 03/06/2020, indicative of benign lesions. No specific follow-up required. 3. Liver appears steatotic. 4.  Aortic atherosclerosis (ICD10-I70.0). 5.  Emphysema (ICD10-J43.9).  Rib detail 08/29/23  old R rib fx       Assessment   DOE (dyspnea on exertion)  Active smoker  - PFT's  03/20/20  FEV1 2.4 (54 % ) ratio 0.74  p 7 % improvement from saba p nothing  prior to study with  and FV curve min concavity  and ERV 25% at wt 303    - 10/03/2023 d/c acei due to prominent pseudowheeze   Pfts are almost purely restrictive with suggestion much of this is due to obesity based on low ERV so rec 1st try off acei and regroup in 6 weeks to consider simplifying care - if  he does have any asthma we'll need to consider In the setting of respiratory symptoms of unknown etiology,  It would be preferable to use bystolic, the most beta -1  selective Beta blocker available in sample form, with bisoprolol the most selective generic choice  on the market, at least on a trial basis, to make sure the spillover Beta 2 effects of the less specific Beta blockers are not contributing to this patient's symptoms.   >>> key to 1st  return with all meds in hand using a trust but verify approach to confirm accurate Medication  Reconciliation The principal here is that until we are certain that the  patients are doing what we've asked, it makes no sense to ask them to do more.   Morbid obesity due to excess calories (HCC) PFT's  03/20/20   ERV 25% at wt 303     Body mass index is 43.76 kg/m.  -    Lab Results  Component Value Date   TSH 1.400 04/26/2022      Contributing to doe and risk of GERD dvt pe  >>>   reviewed the need and the process to achieve and maintain neg calorie balance > defer f/u primary care including intermittently monitoring thyroid status     Essential hypertension, benign D/c ACEi 10/03/2023 due to prominent pseudowheeze  ACE inhibitors are problematic in  pts with airway complaints because  even experienced pulmonologists can't always distinguish ace effects from copd/asthma.  By themselves they don't actually cause a problem, much like oxygen can't by itself start a fire, but they certainly serve as a powerful catalyst or enhancer for any "fire"  or inflammatory process in the upper airway, be it caused by an ET  tube or more commonly reflux (especially in the obese or pts with known GERD or who are on biphoshonates).    In the era of ARB near equivalency until we have a better handle on the reversibility of the airway problem, it just makes sense to avoid ACEI  entirely in the short run and then decide later, having established a level of airway control  using a reasonable limited regimen, whether to add back ace but even then being very careful to observe the pt for worsening airway control and number of meds used/ needed to control symptoms.    >>>try diovan 80 mg daily   and return in 6 weeks         Each maintenance medication was reviewed in detail including emphasizing most importantly the difference between maintenance and prns and under what circumstances the prns are to be triggered using an action plan format where appropriate.  Total time for H and P, chart review, counseling, reviewing hfa/neb/dpi  device(s) and generating customized AVS unique to this office visit / same day charting > 40 min pt new  to me and quite complex        Cigarette smoker 4-5 min discussion re active cigarette smoking in addition to office E&M  Ask about tobacco use:   ongoing  Advise quitting   I took an extended  opportunity with this patient to outline the consequences of continued cigarette use  in airway disorders based on all the data we have from the multiple national lung health studies (perfomed over decades at millions of dollars in cost)  indicating that smoking cessation, not choice of inhalers or pulmonary physicians, is the most important aspect of his care.   Assess willingness:  Not committed at this point Assist in quit attempt:  Per PCP when ready Arrange follow up:   Follow up per Primary Care planned        Sandrea Hughs, MD 10/03/2023

## 2023-10-03 ENCOUNTER — Encounter: Payer: Self-pay | Admitting: Internal Medicine

## 2023-10-03 ENCOUNTER — Ambulatory Visit: Payer: Medicare HMO | Admitting: Internal Medicine

## 2023-10-03 VITALS — BP 110/74 | HR 87 | Ht 70.0 in | Wt 305.0 lb

## 2023-10-03 DIAGNOSIS — I10 Essential (primary) hypertension: Secondary | ICD-10-CM | POA: Diagnosis not present

## 2023-10-03 DIAGNOSIS — R0609 Other forms of dyspnea: Secondary | ICD-10-CM

## 2023-10-03 DIAGNOSIS — F17210 Nicotine dependence, cigarettes, uncomplicated: Secondary | ICD-10-CM

## 2023-10-03 DIAGNOSIS — E538 Deficiency of other specified B group vitamins: Secondary | ICD-10-CM | POA: Diagnosis not present

## 2023-10-03 MED ORDER — VALSARTAN 80 MG PO TABS: 80.0000 mg | ORAL_TABLET | Freq: Every day | ORAL | 11 refills | Status: DC

## 2023-10-03 NOTE — Assessment & Plan Note (Addendum)
D/c ACEi 10/03/2023 due to prominent pseudowheeze  ACE inhibitors are problematic in  pts with airway complaints because  even experienced pulmonologists can't always distinguish ace effects from copd/asthma.  By themselves they don't actually cause a problem, much like oxygen can't by itself start a fire, but they certainly serve as a powerful catalyst or enhancer for any "fire"  or inflammatory process in the upper airway, be it caused by an ET  tube or more commonly reflux (especially in the obese or pts with known GERD or who are on biphoshonates).    In the era of ARB near equivalency until we have a better handle on the reversibility of the airway problem, it just makes sense to avoid ACEI  entirely in the short run and then decide later, having established a level of airway control using a reasonable limited regimen, whether to add back ace but even then being very careful to observe the pt for worsening airway control and number of meds used/ needed to control symptoms.    >>>try diovan 80 mg daily   and return in 6 weeks         Each maintenance medication was reviewed in detail including emphasizing most importantly the difference between maintenance and prns and under what circumstances the prns are to be triggered using an action plan format where appropriate.  Total time for H and P, chart review, counseling, reviewing hfa/neb/dpi  device(s) and generating customized AVS unique to this office visit / same day charting > 40 min pt new to me and quite complex

## 2023-10-03 NOTE — Patient Instructions (Addendum)
Make sure you check your oxygen saturation  AT  your highest level of activity (not after you stop)   to be sure it stays over 90% and adjust  02 flow upward to maintain this level if needed but remember to turn it back to previous settings when you stop (to conserve your supply).   Stop lisinopril and start valsartan 80 mg daily   Please schedule a follow up office visit in 6 weeks, call sooner if needed with all medications /inhalers/ oxygen solutions in hand so we can verify exactly what you are taking. This includes all medications from all doctors and over the counters

## 2023-10-03 NOTE — Assessment & Plan Note (Signed)
Active smoker  - PFT's  03/20/20  FEV1 2.4 (54 % ) ratio 0.74  p 7 % improvement from saba p nothing  prior to study with  and FV curve min concavity  and ERV 25% at wt 303    - 10/03/2023 d/c acei due to prominent pseudowheeze   Pfts are almost purely restrictive with suggestion much of this is due to obesity based on low ERV so rec 1st try off acei and regroup in 6 weeks to consider simplifying care - if he does have any asthma we'll need to consider In the setting of respiratory symptoms of unknown etiology,  It would be preferable to use bystolic, the most beta -1  selective Beta blocker available in sample form, with bisoprolol the most selective generic choice  on the market, at least on a trial basis, to make sure the spillover Beta 2 effects of the less specific Beta blockers are not contributing to this patient's symptoms.   >>> key to 1st  return with all meds in hand using a trust but verify approach to confirm accurate Medication  Reconciliation The principal here is that until we are certain that the  patients are doing what we've asked, it makes no sense to ask them to do more.

## 2023-10-03 NOTE — Assessment & Plan Note (Signed)
PFT's  03/20/20   ERV 25% at wt 303     Body mass index is 43.76 kg/m.  -    Lab Results  Component Value Date   TSH 1.400 04/26/2022      Contributing to doe and risk of GERD dvt pe  >>>   reviewed the need and the process to achieve and maintain neg calorie balance > defer f/u primary care including intermittently monitoring thyroid status

## 2023-10-03 NOTE — Assessment & Plan Note (Signed)

## 2023-10-04 ENCOUNTER — Encounter: Payer: Self-pay | Admitting: "Endocrinology

## 2023-10-04 ENCOUNTER — Ambulatory Visit: Payer: Medicare HMO | Admitting: "Endocrinology

## 2023-10-04 VITALS — BP 132/84 | HR 80 | Ht 70.0 in | Wt 303.6 lb

## 2023-10-04 DIAGNOSIS — F17200 Nicotine dependence, unspecified, uncomplicated: Secondary | ICD-10-CM

## 2023-10-04 DIAGNOSIS — Z794 Long term (current) use of insulin: Secondary | ICD-10-CM

## 2023-10-04 DIAGNOSIS — E782 Mixed hyperlipidemia: Secondary | ICD-10-CM | POA: Diagnosis not present

## 2023-10-04 DIAGNOSIS — I10 Essential (primary) hypertension: Secondary | ICD-10-CM

## 2023-10-04 DIAGNOSIS — E1159 Type 2 diabetes mellitus with other circulatory complications: Secondary | ICD-10-CM | POA: Diagnosis not present

## 2023-10-04 LAB — POCT GLYCOSYLATED HEMOGLOBIN (HGB A1C): HbA1c, POC (controlled diabetic range): 9.2 % — AB (ref 0.0–7.0)

## 2023-10-04 MED ORDER — EMPAGLIFLOZIN 10 MG PO TABS: 10.0000 mg | ORAL_TABLET | Freq: Every day | ORAL | 1 refills | Status: DC

## 2023-10-04 MED ORDER — FREESTYLE LIBRE 3 PLUS SENSOR MISC: 2 refills | Status: DC

## 2023-10-04 MED ORDER — TRULICITY 4.5 MG/0.5ML ~~LOC~~ SOAJ: 4.5000 mg | SUBCUTANEOUS | 1 refills | Status: DC

## 2023-10-04 MED ORDER — FREESTYLE LIBRE 3 READER DEVI: 1.0000 | Freq: Once | 0 refills | Status: AC | PRN

## 2023-10-04 NOTE — Patient Instructions (Signed)
                                     Advice for Weight Management  -For most of us the best way to lose weight is by diet management. Generally speaking, diet management means consuming less calories intentionally which over time brings about progressive weight loss.  This can be achieved more effectively by avoiding ultra processed carbohydrates, processed meats, unhealthy fats.    It is critically important to know your numbers: how much calorie you are consuming and how much calorie you need. More importantly, our carbohydrates sources should be unprocessed naturally occurring  complex starch food items.  It is always important to balance nutrition also by  appropriate intake of proteins (mainly plant-based), healthy fats/oils, plenty of fruits and vegetables.   -The American College of Lifestyle Medicine (ACL M) recommends nutrition derived mostly from Whole Food, Plant Predominant Sources example an apple instead of applesauce or apple pie. Eat Plenty of vegetables, Mushrooms, fruits, Legumes, Whole Grains, Nuts, seeds in lieu of processed meats, processed snacks/pastries red meat, poultry, eggs.  Use only water or unsweetened tea for hydration.  The College also recommends the need to stay away from risky substances including alcohol, smoking; obtaining 7-9 hours of restorative sleep, at least 150 minutes of moderate intensity exercise weekly, importance of healthy social connections, and being mindful of stress and seek help when it is overwhelming.    -Sticking to a routine mealtime to eat 3 meals a day and avoiding unnecessary snacks is shown to have a big role in weight control. Under normal circumstances, the only time we burn stored energy is when we are hungry, so allow  some hunger to take place- hunger means no food between appropriate meal times, only water.  It is not advisable to starve.   -It is better to avoid simple carbohydrates including:  Cakes, Sweet Desserts, Ice Cream, Soda (diet and regular), Sweet Tea, Candies, Chips, Cookies, Store Bought Juices, Alcohol in Excess of  1-2 drinks a day, Lemonade,  Artificial Sweeteners, Doughnuts, Coffee Creamers, "Sugar-free" Products, etc, etc.  This is not a complete list.....    -Consulting with certified diabetes educators is proven to provide you with the most accurate and current information on diet.  Also, you may be  interested in discussing diet options/exchanges , we can schedule a visit with [name], RDN, CDE for individualized nutrition education.  -Exercise: If you are able: 30 -60 minutes a day ,4 days a week, or 150 minutes of moderate intensity exercise weekly.    The longer the better if tolerated.  Combine stretch, strength, and aerobic activities.  If you were told in the past that you have high risk for cardiovascular diseases, or if you are currently symptomatic, you may seek evaluation by your heart doctor prior to initiating moderate to intense exercise programs.                                  Additional Care Considerations for Diabetes/Prediabetes   -Diabetes  is a chronic disease.  The most important care consideration is regular follow-up with your diabetes care provider with the goal being avoiding or delaying its complications and to take advantage of advances in medications and technology.  If appropriate actions are taken early enough, type 2 diabetes can even be   reversed.  Seek information from the right source.  - Whole Food, Plant Predominant Nutrition is highly recommended: Eat Plenty of vegetables, Mushrooms, fruits, Legumes, Whole Grains, Nuts, seeds in lieu of processed meats, processed snacks/pastries red meat, poultry, eggs as recommended by American College of  Lifestyle Medicine (ACLM).  -Type 2 diabetes is known to coexist with other important comorbidities such as high blood pressure and high cholesterol.  It is critical to control not only the  diabetes but also the high blood pressure and high cholesterol to minimize and delay the risk of complications including coronary artery disease, stroke, amputations, blindness, etc.  The good news is that this diet recommendation for type 2 diabetes is also very helpful for managing high cholesterol and high blood blood pressure.  - Studies showed that people with diabetes will benefit from a class of medications known as ACE inhibitors and statins.  Unless there are specific reasons not to be on these medications, the standard of care is to consider getting one from these groups of medications at an optimal doses.  These medications are generally considered safe and proven to help protect the heart and the kidneys.    - People with diabetes are encouraged to initiate and maintain regular follow-up with eye doctors, foot doctors, dentists , and if necessary heart and kidney doctors.     - It is highly recommended that people with diabetes quit smoking or stay away from smoking, and get yearly  flu vaccine and pneumonia vaccine at least every 5 years.  See above for additional recommendations on exercise, sleep, stress management , and healthy social connections.      

## 2023-10-04 NOTE — Progress Notes (Signed)
10/04/2023  Endocrinology follow-up note   Subjective:    Patient ID: [name], male    DOB: 02/17/1961.  he is being seen in follow-up  for management of currently uncontrolled symptomatic type 2 diabetes, hypertension, hyperlipidemia, obesity. PMD:  Assunta Found, MD.   Past Medical History:  Diagnosis Date   Abnormal myocardial perfusion study 01/01/2011   there a small to moderate sized inferobasal scar   Barrett's esophagus    Cataracts, bilateral    Chronic kidney disease    hx of kidney stones   Claudication (HCC) 11/16/2011   PV test perform shows normal   COPD (chronic obstructive pulmonary disease) (HCC)    Depression    Diabetes (HCC)    type 2 diabetes mellitus   Dysrhythmia    GERD (gastroesophageal reflux disease)    Glaucoma    Hernia of abdominal wall    History of radiation therapy    Right lung, SBRT- 04/16/21-04/30/21- Dr. Antony Blackbird   HTN (hypertension)    Hyperlipidemia    Morbid obesity (HCC)    Myocardial infarction (HCC) 2008,2009,2009   OSA (obstructive sleep apnea)    on cpap   PAF (paroxysmal atrial fibrillation) (HCC)    S/P colonoscopy 2009   3-4 mm transverse colon erosions likely secondary to  ASA   S/P endoscopy 10/2010   moderate erosive gastritis, Barrett's esophagus 1-2cm   Sleep apnea    SOB (shortness of breath) 11/03/2007   2D Echo EF 50%-55%   Stroke (HCC) 2017   Past Surgical History:  Procedure Laterality Date   BIOPSY N/A 04/29/2015   Procedure: BIOPSY;  Surgeon: West Bali, MD;  Location: [facility];  Service: Endoscopy;  Laterality: N/A;   BIOPSY  08/01/2018   Procedure: BIOPSY;  Surgeon: West Bali, MD;  Location: [facility];  Service: Endoscopy;;  esophageal   BIOPSY  10/12/2021   Procedure: BIOPSY;  Surgeon: Lanelle Bal, DO;  Location: [facility];  Service: Endoscopy;;   BIOPSY  05/09/2023   Procedure: BIOPSY;  Surgeon: Lanelle Bal, DO;  Location: [facility];  Service:  Endoscopy;;   CABG X 4  03/2008   CARDIAC CATHETERIZATION  2009   stent placement to the left circumflex a 2.25    CARDIAC CATHETERIZATION  07/08/2010   CARDIAC CATHETERIZATION N/A 11/02/2019   COLONOSCOPY WITH PROPOFOL N/A 09/27/2017   normal ileum, twenty 4 to 8 mm polyps in the sigmoid colon, descending colon, splenic flexure, transverse colon, ascending colon, cecum.  An additional three 2 to 4 mm polyps in the rectum and the descending colon.  Diverticulosis, internal hemorrhoids.  Surgical pathology found the polyps to be one fragment of hyperplastic polyp and 22 fragments of tubular adenoma..  Recommended repeat colonoscop   COLONOSCOPY WITH PROPOFOL N/A 10/12/2021   Procedure: COLONOSCOPY WITH PROPOFOL;  Surgeon: Lanelle Bal, DO;  Location: [facility];  Service: Endoscopy;  Laterality: N/A;  1:30pm   CORONARY ARTERY BYPASS GRAFT  2008   4 vessels   CORONARY STENT PLACEMENT  12/29/12   CORONARY STENT PLACEMENT  12/2012   ESOPHAGEAL BRUSHING  05/09/2023   Procedure: ESOPHAGEAL BRUSHING;  Surgeon: Lanelle Bal, DO;  [facility]: [facility];  Service: Endoscopy;;   ESOPHAGEAL DILATION N/A 04/29/2015   Procedure: ESOPHAGEAL DILATION 15 mm, 16 mm;  Surgeon: West Bali, MD;  Location: [facility];  Service: Endoscopy;  Laterality: N/A;   ESOPHAGOGASTRODUODENOSCOPY  09/2011   Barrett's esophagus,  no dysplasia on biopsies. Distal esophagitis. Status post dilation. Moderate gastritis and duodenitis, but biopsies benign. Next EGD in November 2015 for surveillance of Barrett's esophagus.   ESOPHAGOGASTRODUODENOSCOPY (EGD) WITH PROPOFOL N/A 04/29/2015   SLF: 1. Barretts esophagus 2. Moderate non-erosive gastritis.    ESOPHAGOGASTRODUODENOSCOPY (EGD) WITH PROPOFOL N/A 08/01/2018   Barrett's, repeat in 5 years. Empiric dilatation, mild gastritis   ESOPHAGOGASTRODUODENOSCOPY (EGD) WITH PROPOFOL N/A 05/09/2023   Procedure: ESOPHAGOGASTRODUODENOSCOPY (EGD) WITH PROPOFOL;  Surgeon: Lanelle Bal, DO;  [facility]: [facility];  Service: Endoscopy;  Laterality: N/A;  730am, asa 4   HERNIA REPAIR     ventral hernia repair   LEFT HEART CATHETERIZATION WITH CORONARY/GRAFT ANGIOGRAM N/A 12/29/2012   Procedure: LEFT HEART CATHETERIZATION WITH Isabel Caprice;  Surgeon: Lennette Bihari, MD;  Location: [facility] CATH LAB;  Service: Cardiovascular;  Laterality: N/A;   ORIF FIBULA FRACTURE Right 06/03/2015   Procedure: OPEN REDUCTION INTERNAL FIXATION (ORIF) DISTAL FIBULA  FRACTURE;  Surgeon: Valeria Batman, MD;  Location: [facility];  Service: Orthopedics;  Laterality: Right;   PERCUTANEOUS CORONARY STENT INTERVENTION (PCI-S)  12/29/2012   Procedure: PERCUTANEOUS CORONARY STENT INTERVENTION (PCI-S);  Surgeon: Lennette Bihari, MD;  Location: [facility] CATH LAB;  Service: Cardiovascular;;   POLYPECTOMY  09/27/2017   Procedure: POLYPECTOMY;  Surgeon: West Bali, MD;  [facility]: [facility];  Service: Endoscopy;;  cecal polyp, ascending polyps x6, transverse colon polyps x6, splenic flexure polyps x2, descending colon polyps x6, sigmoid  colon polyp x1, rectal polyp x1    POLYPECTOMY  10/12/2021   Procedure: POLYPECTOMY;  Surgeon: Lanelle Bal, DO;  [facility]: [facility];  Service: Endoscopy;;   SAVORY DILATION  09/06/2011   Procedure: SAVORY DILATION;  Surgeon: Arlyce Harman, MD;  Location: [facility];  Service: Endoscopy;  Laterality: N/A;  Dilated with 15mm   SAVORY DILATION N/A 08/01/2018   Procedure: SAVORY DILATION;  Surgeon: West Bali, MD;  Location: [facility];  Service: Endoscopy;  Laterality: N/A;   Social History   Socioeconomic History   Marital status: Divorced    Spouse name: Not on file   Number of children: Not on file   Years of education: Not on file   Highest education level: Not on file  Occupational History   Not on file  Tobacco Use   Smoking status: Every Day    Current packs/day: 0.50    Average packs/day: 0.5 packs/day for 30.0 years (15.0 ttl pk-yrs)     Types: Cigarettes   Smokeless tobacco: Never   Tobacco comments:    Smokes 0.25 packs per day [facility]J, 12/29/22  Vaping Use   Vaping status: Never Used  Substance and Sexual Activity   Alcohol use: Yes    Comment: occ beer   Drug use: No   Sexual activity: Never  Other Topics Concern   Not on file  Social History Narrative   Not on file   Social Determinants of Health   Financial Resource Strain: Low Risk  (03/22/2023)   Received from [facility], [facility]   Overall Financial Resource Strain (CARDIA)    Difficulty of Paying Living Expenses: Not very hard  Food Insecurity: No Food Insecurity (03/22/2023)   Received from [facility], [facility]   Hunger Vital Sign    Worried About Running Out of Food in the Last Year: Never true    Ran Out of Food in the Last Year: Never true  Transportation Needs: No Transportation Needs (  03/22/2023)   Received from [facility], [facility]   PRAPARE - Transportation    Lack of Transportation (Medical): No    Lack of Transportation (Non-Medical): No  Physical Activity: Not on file  Stress: Not on file  Social Connections: Unknown (03/03/2022)   Received from [facility], [facility]   Social Network    Social Network: Not on file   Outpatient Encounter Medications as of 10/04/2023  Medication Sig   Continuous Glucose Receiver (FREESTYLE LIBRE 3 READER) DEVI 1 Piece by Does not apply route once as needed for up to 1 dose.   Continuous Glucose Sensor (FREESTYLE LIBRE 3 PLUS SENSOR) MISC Change sensor every 15 days.   Dulaglutide (TRULICITY) 4.5 MG/0.5ML SOAJ Inject 4.5 mg as directed once a week.   empagliflozin (JARDIANCE) 10 MG TABS tablet Take 1 tablet (10 mg total) by mouth daily before breakfast.   acetaminophen (TYLENOL) 500 MG tablet Take 500 mg by mouth every 6 (six) hours as needed for moderate pain.   albuterol (VENTOLIN HFA) 108 (90 Base) MCG/ACT inhaler Inhale 1-2 puffs into the lungs every 6 (six) hours as needed for  wheezing or shortness of breath.   apixaban (ELIQUIS) 5 MG TABS tablet Take 1 tablet (5 mg total) by mouth 2 (two) times daily.   blood glucose meter kit and supplies Dispense based on patient and insurance preference. Use up to two times daily as directed. (FOR ICD-E11.65)   Blood Glucose Monitoring Suppl (ONETOUCH VERIO) w/Device KIT 1 each by Does not apply route as needed.   Cholecalciferol (VITAMIN D) 50 MCG (2000 UT) tablet Take 2,000 Units by mouth daily.   Cyanocobalamin (VITAMIN B-12 IJ) Inject 1,000 mcg as directed every 30 (thirty) days.   diazepam (VALIUM) 5 MG tablet Take 5 mg by mouth 2 (two) times daily.   diclofenac Sodium (VOLTAREN) 1 % GEL Apply 1 Application topically 4 (four) times daily as needed (pain).   DULoxetine (CYMBALTA) 30 MG capsule Take 30 mg by mouth daily.   escitalopram (LEXAPRO) 20 MG tablet Take 20 mg by mouth in the morning.   fenofibrate (TRICOR) 145 MG tablet Take 145 mg by mouth in the morning.   furosemide (LASIX) 40 MG tablet Take 40 mg by mouth daily as needed.   gabapentin (NEURONTIN) 300 MG capsule Take 300 mg by mouth 3 (three) times daily.   Insulin Pen Needle (B-D ULTRAFINE III SHORT PEN) 31G X 8 MM MISC Use as directed three time daily to inject insulin   insulin regular human CONCENTRATED (HUMULIN R U-500 KWIKPEN) 500 UNIT/ML KwikPen Inject 70 Units into the skin 3 (three) times daily with meals. Only when blood glucose is above 90 and eating.   ipratropium-albuterol (DUONEB) 0.5-2.5 (3) MG/3ML SOLN Take 3 mLs by nebulization every 6 (six) hours as needed.   isosorbide mononitrate (IMDUR) 120 MG 24 hr tablet Take 120 mg by mouth in the morning.   Lancets (ONETOUCH DELICA PLUS LANCET33G) MISC USE TO check blood sugar FOUR TIMES DAILY AS DIRECTED   meclizine (ANTIVERT) 25 MG tablet Take 25 mg by mouth every 6 (six) hours as needed for dizziness or nausea.   metoprolol tartrate (LOPRESSOR) 100 MG tablet Take 1 tablet (100 mg total) by mouth 2 (two)  times daily. OFFICE VISIT NEEDED BEFORE ADDITIONAL REFILLS   modafinil (PROVIGIL) 200 MG tablet Take 200 mg by mouth daily.   mupirocin ointment (BACTROBAN) 2 % Apply 1 Application topically 2 (two) times daily.   nitroGLYCERIN (NITROLINGUAL)  0.4 MG/SPRAY spray Place 1 spray under the tongue every 5 (five) minutes x 3 doses as needed for chest pain.   ONETOUCH VERIO test strip USE TO check blood sugar FOUR TIMES DAILY AS DIRECTED   pantoprazole (PROTONIX) 40 MG tablet Take 1 tablet (40 mg total) by mouth 2 (two) times daily before a meal.   polyethylene glycol powder (GLYCOLAX/MIRALAX) 17 GM/SCOOP powder Take 17 g once daily in 8 ounces of water.   potassium chloride SA (KLOR-CON M) 20 MEQ tablet Take 1 tablet (20 mEq total) by mouth daily. While Taking Lasix/Furosemide (Patient taking differently: Take 20 mEq by mouth 2 (two) times daily.)   psyllium (METAMUCIL) 58.6 % packet Take 1 packet by mouth daily.   rosuvastatin (CRESTOR) 20 MG tablet Take 20 mg by mouth in the morning.   TRELEGY ELLIPTA 100-62.5-25 MCG/ACT AEPB Inhale 1 puff into the lungs daily.   valsartan (DIOVAN) 80 MG tablet Take 1 tablet (80 mg total) by mouth daily.   [DISCONTINUED] Continuous Glucose Sensor (FREESTYLE LIBRE 2 SENSOR) MISC Use to check glucose four times daily   [DISCONTINUED] Dulaglutide (TRULICITY) 3 MG/0.5ML SOPN Inject 3 mg as directed once a week.   No facility-administered encounter medications on file as of 10/04/2023.    ALLERGIES: Allergies  Allergen Reactions   Contrast Media [Iodinated Contrast Media] Other (See Comments)    Pt must be premedicated before given contrast media - stops heart   Iohexol Other (See Comments)     Consult with radiologist before pre meds are given.Desc: PT. STATES HEART STOPPED HAS TO BE PREMED.     VACCINATION STATUS: Immunization History  Administered Date(s) Administered   Influenza Inj Mdck Quad Pf 09/25/2018   Influenza Split 08/23/2014, 10/23/2015    Influenza,inj,Quad PF,6+ Mos 08/03/2016, 07/10/2019, 09/22/2020, 09/14/2021, 07/31/2022    Diabetes He presents for his follow-up diabetic visit. He has type 2 diabetes mellitus. Onset time: He was diagnosed at approximate age of 42 years. His disease course has been fluctuating. There are no hypoglycemic associated symptoms. Pertinent negatives for hypoglycemia include no confusion, headaches, pallor or seizures. Pertinent negatives for diabetes include no blurred vision, no chest pain, no fatigue, no polydipsia, no polyphagia, no polyuria and no weakness. There are no hypoglycemic complications. Symptoms are worsening. Diabetic complications include a CVA and heart disease. Risk factors for coronary artery disease include diabetes mellitus, dyslipidemia, hypertension, male sex, obesity, tobacco exposure, sedentary lifestyle and family history. Current diabetic treatments: He was supposed to be on basal insulin, unfortunately he ran out and could not afford refills and he did not call clinic for alternatives. He is compliant with treatment none of the time. His weight is fluctuating minimally. He is following a generally unhealthy diet. When asked about meal planning, he reported none. He has not had a previous visit with a dietitian. He never participates in exercise. His home blood glucose trend is fluctuating minimally. His breakfast blood glucose range is generally >200 mg/dl. His lunch blood glucose range is generally >200 mg/dl. His dinner blood glucose range is generally >200 mg/dl. His bedtime blood glucose range is generally >200 mg/dl. His overall blood glucose range is >200 mg/dl. Geathers presents with his CGM which shows continued struggle , but slightly better profile done last visit.  He has 47% time in range, 34% level 1 hyperglycemia, 19% level 2 hyperglycemia.  Has no hypoglycemia.  His point-of-care A1c is unchanged at 9.2%.  His average blood glucose is 200 mg per DL for the  last 14 days.       ) An ACE inhibitor/angiotensin II receptor blocker is being taken. He does not see a podiatrist.Eye exam is not current.  Hyperlipidemia This is a chronic problem. The current episode started more than 1 year ago. The problem is uncontrolled. Recent lipid tests were reviewed and are high. Exacerbating diseases include diabetes and obesity. Pertinent negatives include no chest pain, myalgias or shortness of breath. Current antihyperlipidemic treatment includes statins. Compliance problems include medication cost and psychosocial issues.  Risk factors for coronary artery disease include dyslipidemia, diabetes mellitus, hypertension, male sex, obesity, family history and a sedentary lifestyle.  Hypertension This is a chronic problem. The current episode started more than 1 year ago. The problem is uncontrolled. Pertinent negatives include no blurred vision, chest pain, headaches, neck pain, palpitations or shortness of breath. Risk factors for coronary artery disease include diabetes mellitus, dyslipidemia, male gender, obesity, family history, sedentary lifestyle and smoking/tobacco exposure. Past treatments include ACE inhibitors. Hypertensive end-organ damage includes CVA.     Review of systems  Constitutional: + Minimally fluctuating body weight,  current  Body mass index is 43.56 kg/m. , no fatigue, no subjective hyperthermia, no subjective hypothermia    Objective:    BP 132/84   Pulse 80   Ht 5\' 10"  (1.778 m)   Wt (!) 303 lb 9.6 oz (137.7 kg)   BMI 43.56 kg/m   Wt Readings from Last 3 Encounters:  10/04/23 (!) 303 lb 9.6 oz (137.7 kg)  10/03/23 (!) 305 lb (138.3 kg)  09/01/23 (!) 313 lb (142 kg)      Physical Exam- Limited  Constitutional:  Body mass index is 43.56 kg/m. , not in acute distress, normal state of mind    CMP ( most recent) CMP     Component Value Date/Time   NA 139 08/29/2023 2124   NA 134 05/10/2023 1404   K 3.6 08/29/2023 2124   CL 101  08/29/2023 2124   CO2 31 08/29/2023 2124   GLUCOSE 95 08/29/2023 2124   BUN 10 08/29/2023 2124   BUN 19 05/10/2023 1404   CREATININE 1.25 (H) 08/29/2023 2124   CALCIUM 9.1 08/29/2023 2124   PROT 6.9 05/10/2023 1404   ALBUMIN 4.3 05/10/2023 1404   AST 22 05/10/2023 1404   ALT 20 05/10/2023 1404   ALKPHOS 85 05/10/2023 1404   BILITOT 0.5 05/10/2023 1404   GFRNONAA >60 08/29/2023 2124   GFRAA 93 12/04/2020 0000   Diabetic Labs (most recent): Lab Results  Component Value Date   HGBA1C 9.2 (A) 05/17/2023   HGBA1C 8.8 (A) 02/10/2023   HGBA1C 7.5 (H) 10/12/2022   MICROALBUR 10 06/26/2020     Lipid Panel ( most recent) Lipid Panel     Component Value Date/Time   CHOL 134 05/10/2023 1404   TRIG 487 (H) 05/10/2023 1404   HDL 24 (L) 05/10/2023 1404   CHOLHDL 5.6 (H) 05/10/2023 1404   CHOLHDL 6.1 04/28/2014 0121   VLDL 61 (H) 04/28/2014 0121   LDLCALC 40 05/10/2023 1404     Assessment & Plan:   1. DM type 2 causing vascular disease (HCC)  - Patient has currently uncontrolled symptomatic type 2 DM since  62 years of age.  Lennie presents with his CGM which shows continued struggle , but slightly better profile done last visit.  He has 47% time in range, 34% level 1 hyperglycemia, 19% level 2 hyperglycemia.  Has no hypoglycemia.  His point-of-care A1c is unchanged at 9.2%.  His average blood glucose is 200 mg per DL for the last 14 days.    -his diabetes is complicated by coronary artery disease, CVA, obesity/sedentary life, chronic heavy smoking and WAYLEN LEVI remains at extremely high risk  for more acute and chronic complications which include CAD, CVA, CKD, retinopathy, and neuropathy. These are all discussed in detail with the patient.  - I have counseled him on diet management and weight loss, by adopting a carbohydrate restricted/protein rich diet.  Faas is really struggling to connect the dots in his self-care.  Admittedly, he does not follow the routine meal  and incretin times recommended.  However he wishes to stay on the same regimen of insulin and Trulicity.  - he acknowledges that there is a room for improvement in his food and drink choices. - Suggestion is made for him to avoid simple carbohydrates  from his diet including Cakes, Sweet Desserts, Ice Cream, Soda (diet and regular), Sweet Tea, Candies, Chips, Cookies, Store Bought Juices, Alcohol in Excess of  1-2 drinks a day, Artificial Sweeteners,  Coffee Creamer, and "Sugar-free" Products, Lemonade. This will help patient to have more stable blood glucose profile and potentially avoid unintended weight gain.  - I encouraged him to switch to  unprocessed or minimally processed complex starch and increased protein intake (animal or plant source), fruits, and vegetables. Whole food, plant-based diet was discussed in detail with him.  - he is advised to stick to a routine mealtimes to eat 3 meals  a day and avoid unnecessary snacks ( to snack only to correct hypoglycemia).   - I have approached him with the following individualized plan to manage diabetes and patient agrees:   -He has social problems, not enough support.  Reportedly sleeps extended hours, could be missing some meals.  He is at risk of hypoglycemia.   In the interest of safety, his insulin will be adjusted only slowly.  He is advised to increase his U500 insulin to 70  units  3 times a day for Premeal blood glucose readings above 90 mg per DL,  associated with strict monitoring of blood glucose 4 times a day-before meals and at bedtime, using his CGM continuously. -He is advised to use his meter as a backup when he has problem with his CGM.  - He does not tolerate metformin due to GI side effects.  -He is tolerating Trulicity.  I discussed and increased his Trulicity to 4.5 mg subcutaneously weekly after he finishes his current supplies for Trulicity 3 mg subcutaneously weekly.   He remains a smoker, and with significant  hypertriglyceridemia, at risk for pancreatitis. -Patient is encouraged to call clinic for blood glucose levels less than 70 or above 200 mg /dl. -He may benefit from low-dose SGLT2 inhibitors.  I discussed and prescribed Jardiance 10 mg p.o. daily at breakfast.  Side effects and precautions specifically urinary tract infection and genital mycotic infections was discussed with him.   - Patient specific target  A1c;  LDL, HDL, Triglycerides,  were discussed in detail.  2) BP/HTN:  -His blood pressure is controlled to target.  He remains on his lisinopril 10 mg p.o. daily, metoprolol 100 mg p.o. twice daily, and advised to continue. The patient was counseled on the dangers of tobacco use, and was advised to quit.  Reviewed strategies to maximize success, including removing cigarettes and smoking materials from environment.   3) Lipids/HPL: He has had history of severe hypertriglyceridemia, recently improving to 230 from 469, mainly  associated with improvement in his glycemic profile.  His LDL is on target at 62.  He is advised to continue Trilipix 135 mg p.o. nightly, continue Crestor 20 mg p.o. nightly.  He is also on fenofibrate 140 mg p.o. nightly.       4)  Weight/Diet: His BMI is 43.56-clearly complicating his diabetes care.  He is a candidate for modest weight loss.  CDE Consult has been  initiated , exercise, and detailed carbohydrates information provided.  5) vitamin D deficiency: He is on ongoing supplement with vitamin D2 50,000 units weekly for 12 weeks.  6) Chronic Care/Health Maintenance:  -he  is on ACEI/ARB and Statin medications and  is encouraged to continue to follow up with Ophthalmology, Dentist,  Podiatrist at least yearly or according to recommendations, and advised to  quit smoking. I have recommended yearly flu vaccine and pneumonia vaccination at least every 5 years; moderate intensity exercise for up to 150 minutes weekly; and  sleep for at least 7 hours a day.   - I  advised patient to maintain close follow up with Assunta Found, MD for primary care needs.   I spent  42  minutes in the care of the patient today including review of labs from CMP, Lipids, Thyroid Function, Hematology (current and previous including abstractions from other facilities); face-to-face time discussing  his blood glucose readings/logs, discussing hypoglycemia and hyperglycemia episodes and symptoms, medications doses, his options of short and long term treatment based on the latest standards of care / guidelines;  discussion about incorporating lifestyle medicine;  and documenting the encounter. Risk reduction counseling performed per USPSTF guidelines to reduce  obesity and cardiovascular risk factors.     Please refer to Patient Instructions for Blood Glucose Monitoring and Insulin/Medications Dosing Guide"  in media tab for additional information. Please  also refer to " Patient Self Inventory" in the Media  tab for reviewed elements of pertinent patient history.  Christie Beckers participated in the discussions, expressed understanding, and voiced agreement with the above plans.  All questions were answered to his satisfaction. he is encouraged to contact clinic should he have any questions or concerns prior to his return visit.    Follow up plan: - Return in about 4 months (around 02/02/2024) for Bring Meter/CGM Device/Logs- A1c in Office.  Marquis Lunch, MD Phone: [phone]  Fax: [phone]   10/04/2023, 2:12 PM   This note was partially dictated with voice recognition software. Similar sounding words can be transcribed inadequately or may not  be corrected upon review.

## 2023-10-05 DIAGNOSIS — Z794 Long term (current) use of insulin: Secondary | ICD-10-CM | POA: Diagnosis not present

## 2023-10-05 DIAGNOSIS — Z6841 Body Mass Index (BMI) 40.0 and over, adult: Secondary | ICD-10-CM | POA: Diagnosis not present

## 2023-10-05 DIAGNOSIS — J449 Chronic obstructive pulmonary disease, unspecified: Secondary | ICD-10-CM | POA: Diagnosis not present

## 2023-10-05 DIAGNOSIS — I130 Hypertensive heart and chronic kidney disease with heart failure and stage 1 through stage 4 chronic kidney disease, or unspecified chronic kidney disease: Secondary | ICD-10-CM | POA: Diagnosis not present

## 2023-10-05 DIAGNOSIS — R6889 Other general symptoms and signs: Secondary | ICD-10-CM | POA: Diagnosis not present

## 2023-10-05 DIAGNOSIS — I480 Paroxysmal atrial fibrillation: Secondary | ICD-10-CM | POA: Diagnosis not present

## 2023-10-05 DIAGNOSIS — Z20828 Contact with and (suspected) exposure to other viral communicable diseases: Secondary | ICD-10-CM | POA: Diagnosis not present

## 2023-10-05 DIAGNOSIS — I5030 Unspecified diastolic (congestive) heart failure: Secondary | ICD-10-CM | POA: Diagnosis not present

## 2023-10-05 DIAGNOSIS — D6869 Other thrombophilia: Secondary | ICD-10-CM | POA: Diagnosis not present

## 2023-10-05 DIAGNOSIS — D235 Other benign neoplasm of skin of trunk: Secondary | ICD-10-CM | POA: Diagnosis not present

## 2023-10-05 DIAGNOSIS — U099 Post covid-19 condition, unspecified: Secondary | ICD-10-CM | POA: Diagnosis not present

## 2023-10-05 DIAGNOSIS — J069 Acute upper respiratory infection, unspecified: Secondary | ICD-10-CM | POA: Diagnosis not present

## 2023-10-06 DIAGNOSIS — E1159 Type 2 diabetes mellitus with other circulatory complications: Secondary | ICD-10-CM | POA: Diagnosis not present

## 2023-10-07 ENCOUNTER — Other Ambulatory Visit: Payer: Self-pay

## 2023-10-07 ENCOUNTER — Telehealth: Payer: Self-pay | Admitting: Internal Medicine

## 2023-10-07 MED ORDER — TRELEGY ELLIPTA 100-62.5-25 MCG/ACT IN AEPB
1.0000 | INHALATION_SPRAY | Freq: Once | RESPIRATORY_TRACT | Status: AC
Start: 2023-10-07 — End: 2023-10-07

## 2023-10-07 MED ORDER — TRELEGY ELLIPTA 100-62.5-25 MCG/ACT IN AEPB: 1.0000 | INHALATION_SPRAY | Freq: Every day | RESPIRATORY_TRACT | 2 refills | Status: DC

## 2023-10-07 NOTE — Telephone Encounter (Signed)
Called and informed pt that  we have left samples up front for him to put up

## 2023-10-07 NOTE — Telephone Encounter (Signed)
Patient is asking if we have any samples of Trelegy 100-62.5-25 he could have--please call [phone]

## 2023-10-07 NOTE — Telephone Encounter (Signed)
Called  and spoke with patient he wanted to if could get a  sample  b/c he was out of his medication. I informed pt that we can send in refill to the pharmacy to last until his appt. Pt was to know if he could get a sample because he could afford to pick up his medication , please advise

## 2023-10-07 NOTE — Telephone Encounter (Signed)
That's fine

## 2023-10-11 ENCOUNTER — Telehealth: Payer: Self-pay | Admitting: "Endocrinology

## 2023-10-11 NOTE — Telephone Encounter (Signed)
Pt states last night he ate "potato skins" for dinner at 5:30pm and did not have any type of snack in between dinner and bedtime. States he takes Humulin R U-500 70 units tid with meals and recently started taking Jardiance 10mg  daily. Stated is BG Monday was 230 before breakfast and 175 before supper. This morning his BG was 58 at 8:30 this morning, 15 minutes later went up to 61 and pt rechecked his BG at 10:30 which resulted 106.

## 2023-10-11 NOTE — Telephone Encounter (Signed)
Pt left a VM stating his BS this am was 58. He said its not been that low and wanted to know could you call him back

## 2023-10-11 NOTE — Telephone Encounter (Signed)
Spoke with pt advising him to lower his Humulin R U-500 to 40 units tidAC only if he eats meals and his pre-meal glucose is above 90mg /dl per Dr.Nida's orders Pt voiced understanding.

## 2023-10-31 ENCOUNTER — Encounter: Payer: Self-pay | Admitting: Gastroenterology

## 2023-10-31 ENCOUNTER — Ambulatory Visit (INDEPENDENT_AMBULATORY_CARE_PROVIDER_SITE_OTHER): Payer: Medicare HMO | Admitting: Gastroenterology

## 2023-10-31 VITALS — BP 94/64 | HR 78 | Temp 98.1°F | Ht 70.0 in | Wt 311.7 lb

## 2023-10-31 DIAGNOSIS — K2270 Barrett's esophagus without dysplasia: Secondary | ICD-10-CM

## 2023-10-31 DIAGNOSIS — K625 Hemorrhage of anus and rectum: Secondary | ICD-10-CM

## 2023-10-31 DIAGNOSIS — K219 Gastro-esophageal reflux disease without esophagitis: Secondary | ICD-10-CM | POA: Diagnosis not present

## 2023-10-31 DIAGNOSIS — R1310 Dysphagia, unspecified: Secondary | ICD-10-CM | POA: Diagnosis not present

## 2023-10-31 DIAGNOSIS — Z8719 Personal history of other diseases of the digestive system: Secondary | ICD-10-CM

## 2023-10-31 DIAGNOSIS — R151 Fecal smearing: Secondary | ICD-10-CM

## 2023-10-31 DIAGNOSIS — K5900 Constipation, unspecified: Secondary | ICD-10-CM

## 2023-10-31 MED ORDER — HYDROCORTISONE (PERIANAL) 2.5 % EX CREA: 1.0000 | TOPICAL_CREAM | Freq: Two times a day (BID) | CUTANEOUS | 1 refills | Status: AC

## 2023-10-31 NOTE — Patient Instructions (Addendum)
I am providing you with some samples of Linzess 145 mcg for you to take 1 tablet daily on empty stomach, at least 30 minutes prior to breakfast.  This is to help keep you more regular with your bowel movements given they have been hard.  How to take Linzess: Once a day every day on empty stomach, at least 30 minutes before your first meal of the day. It is best to keep medications at a stable temperature Medication is best kept in its original bottle with the disket present.  It is a medication that is meant for everyday use and not to be used as needed.   What to expect: Constipation relief is typically felt in about 1 week Relief of abdominal pain, discomfort, and bloating begins in about 1 week with symptoms typically improving over 12 weeks and beyond. Diarrhea is most common side effect and typically begins within the first 2 weeks and can take 3-4 weeks to resolve It would be helpful to begin treatment over the weekend or when you can be closer to a bathroom   You can go to [email or web address] for patient support and sign up for daily medication reminders.    Increase your water intake.  Please ensure you are getting at least 4-6 glasses of water daily.  Continue taking pantoprazole 40 mg twice daily and your Valium as needed.  To help with the swallowing I recommend small bites and alternate with sips of liquids allowing adequate time for food to go down prior to eating another bite.  I have sent in Anusol rectal cream for you to apply externally and internally twice a day for 1 week.  If you continue to have some rectal bleeding we will need to do a rectal exam in the office and check labs.  Follow-up in 6 weeks, sooner if needed.  It was a pleasure to see you today. I want to create trusting relationships with patients. If you receive a survey regarding your visit,  I greatly appreciate you taking time to fill this out on paper or through your MyChart. I value your  feedback.  Brooke Bonito, MSN, FNP-BC, AGACNP-BC [facility]

## 2023-10-31 NOTE — Progress Notes (Signed)
GI Office Note    Referring Provider: Assunta Found, MD Primary Care Physician:  Assunta Found, MD Primary Gastroenterologist: Hennie Duos. [name], DO  Date:  10/31/2023  ID:  [name], DOB [birth year], MRN [id number]   Chief Complaint   Chief Complaint  Patient presents with   Constipation    Follow up on constipation. Tried miralax one pack a day and states it did not help. States stool is hard as a rock.    Gastroesophageal Reflux    Follow up on GERD and barrett's. States he does get choked at times.    History of Present Illness  [name] is a 62 y.o. male with a history of Barrett's esophagus, GERD, MI in 2000 06/2008, COPD, A-fib on Eliquis, HTN, HLD, OSA on CPAP, and stroke in 2017 presenting today for follow-up of GERD, constipation.  EGD 2019: -normal esophagus s/p dilation -mild gastritis -repeat in 5 years   Colonoscopy 10/12/21: - Preparation of the colon was fair. - Non-bleeding internal hemorrhoids. - One 2 mm polyp in the transverse colon - Three 4 to 6 mm polyps in the descending colon. - The examination was otherwise normal. - Repeat in 5 years.    OV 03/24/23. Recently with low bp. On O2 nightly with CPAP, some O2 during day. SOB at baseline. Rectal leakage 1-2 times per week. Reflux well controlled on medication. Currently on valium to help with choking sensation/dysphagia. Rare toilet tissue hematochezia. Advised fiber supplement to help bulk stools. Rectal exam offered however deferred by patient.    EGD 05/09/23: -small hiatal hernia -esophageal changes secondary to known barrett's disease sp biopsy -gastritis s/p biopsy -normal duodenum -white nummular lesions in esophagus -Path: reactive gastropathy, reflux esophagitis -KOH negative -Advised PPI BID -Surveillance EGD in 5 years   A1c 9.2 on 7/16  Last office visit 08/01/23.  Feeling took a lot when eating, feeling like his throat shuts off.  This can occur randomly at times even  without eating.  Taking Valium to help with this.  Does have some dry heaves but not nauseous.  Tried some fiber but had difficulties with cost, sometimes going loose and sometimes it hurts and has to strain to go feels like a knife at times when it is hard.  Has some leakage at night.  In the mornings goes 2-3 times within an hour and a half.  Rare toilet tissue hematochezia.  Referred to dermatology given wound on his back.  Advised continue pantoprazole 40 mg twice daily.  Advised to continue fiber supplementation, given samples of Metamucil.  Advised to start MiraLAX once daily, follow-up in 3 months..  Today: Patient reports he has tried MiraLAX once daily for a while but continues to have hard stools. Has been having issues with straining. Will go 3 times in the morning usually. Having incomplete emptying. Sometimes he wipes blood. Does not feel tissue when he wipes. Has some left sided pain usually.   Continues to have some intermittent dysphagia. Reflux is doing okay. Valium does help with the swallowing but once in a while feels like he gets choked while watching tv. Has been short of breath.   Just got some glucose sensors today. Glucose ranges from 230-400. He stats he does not each much.   Wt Readings from Last 3 Encounters:  10/31/23 (!) 311 lb 11.2 oz (141.4 kg)  10/04/23 (!) 303 lb 9.6 oz (137.7 kg)  10/03/23 (!) 305 lb (138.3 kg)    Current Outpatient Medications  Medication Sig Dispense Refill   acetaminophen (TYLENOL) 500 MG tablet Take 500 mg by mouth every 6 (six) hours as needed for moderate pain.     apixaban (ELIQUIS) 5 MG TABS tablet Take 1 tablet (5 mg total) by mouth 2 (two) times daily. 60 tablet 1   blood glucose meter kit and supplies Dispense based on patient and insurance preference. Use up to two times daily as directed. (FOR ICD-E11.65) 1 each 5   Blood Glucose Monitoring Suppl (ONETOUCH VERIO) w/Device KIT 1 each by Does not apply route as needed. 1 kit 0    Cholecalciferol (VITAMIN D) 50 MCG (2000 UT) tablet Take 2,000 Units by mouth daily.     Continuous Glucose Receiver (FREESTYLE LIBRE 3 READER) DEVI 1 Piece by Does not apply route once as needed for up to 1 dose. 1 each 0   Continuous Glucose Sensor (FREESTYLE LIBRE 3 PLUS SENSOR) MISC Change sensor every 15 days. 2 each 2   Cyanocobalamin (VITAMIN B-12 IJ) Inject 1,000 mcg as directed every 30 (thirty) days.     diazepam (VALIUM) 5 MG tablet Take 5 mg by mouth 2 (two) times daily.     diclofenac Sodium (VOLTAREN) 1 % GEL Apply 1 Application topically 4 (four) times daily as needed (pain).     Dulaglutide (TRULICITY) 4.5 MG/0.5ML SOAJ Inject 4.5 mg as directed once a week. 6 mL 1   DULoxetine (CYMBALTA) 30 MG capsule Take 30 mg by mouth daily.     empagliflozin (JARDIANCE) 10 MG TABS tablet Take 1 tablet (10 mg total) by mouth daily before breakfast. 90 tablet 1   escitalopram (LEXAPRO) 20 MG tablet Take 20 mg by mouth in the morning.     fenofibrate (TRICOR) 145 MG tablet Take 145 mg by mouth in the morning.     Fluticasone-Umeclidin-Vilant (TRELEGY ELLIPTA) 100-62.5-25 MCG/ACT AEPB Inhale 1 puff into the lungs daily. 60 each 2   furosemide (LASIX) 40 MG tablet Take 40 mg by mouth daily.     gabapentin (NEURONTIN) 300 MG capsule Take 300 mg by mouth 3 (three) times daily.     hydrocortisone (ANUSOL-HC) 2.5 % rectal cream Place 1 Application rectally 2 (two) times daily. 30 g 1   Insulin Pen Needle (B-D ULTRAFINE III SHORT PEN) 31G X 8 MM MISC Use as directed three time daily to inject insulin 300 each 1   insulin regular human CONCENTRATED (HUMULIN R U-500 KWIKPEN) 500 UNIT/ML KwikPen Inject 70 Units into the skin 3 (three) times daily with meals. Only when blood glucose is above 90 and eating. 36 mL 0   ipratropium-albuterol (DUONEB) 0.5-2.5 (3) MG/3ML SOLN Take 3 mLs by nebulization every 6 (six) hours as needed.     isosorbide mononitrate (IMDUR) 120 MG 24 hr tablet Take 120 mg by mouth in the  morning.     Lancets (ONETOUCH DELICA PLUS LANCET33G) MISC USE TO check blood sugar FOUR TIMES DAILY AS DIRECTED 200 each 1   meclizine (ANTIVERT) 25 MG tablet Take 25 mg by mouth every 6 (six) hours as needed for dizziness or nausea.     metoprolol tartrate (LOPRESSOR) 100 MG tablet Take 1 tablet (100 mg total) by mouth 2 (two) times daily. OFFICE VISIT NEEDED BEFORE ADDITIONAL REFILLS 28 tablet 0   modafinil (PROVIGIL) 200 MG tablet Take 200 mg by mouth daily.     mupirocin ointment (BACTROBAN) 2 % Apply 1 Application topically 2 (two) times daily.     nitroGLYCERIN (NITROLINGUAL) 0.4 MG/SPRAY spray  Place 1 spray under the tongue every 5 (five) minutes x 3 doses as needed for chest pain. 12 g 0   ONETOUCH VERIO test strip USE TO check blood sugar FOUR TIMES DAILY AS DIRECTED 200 strip 1   pantoprazole (PROTONIX) 40 MG tablet Take 1 tablet (40 mg total) by mouth 2 (two) times daily before a meal. 180 tablet 3   potassium chloride SA (KLOR-CON M) 20 MEQ tablet Take 1 tablet (20 mEq total) by mouth daily. While Taking Lasix/Furosemide (Patient taking differently: Take 20 mEq by mouth 2 (two) times daily.) 30 tablet 0   rosuvastatin (CRESTOR) 20 MG tablet Take 20 mg by mouth in the morning.     valsartan (DIOVAN) 80 MG tablet Take 1 tablet (80 mg total) by mouth daily. 30 tablet 11   albuterol (VENTOLIN HFA) 108 (90 Base) MCG/ACT inhaler Inhale 1-2 puffs into the lungs every 6 (six) hours as needed for wheezing or shortness of breath. (Patient not taking: Reported on 10/31/2023) 8.5 g 3   polyethylene glycol powder (GLYCOLAX/MIRALAX) 17 GM/SCOOP powder Take 17 g once daily in 8 ounces of water. (Patient not taking: Reported on 10/31/2023) 238 g 0   No current facility-administered medications for this visit.    Past Medical History:  Diagnosis Date   Abnormal myocardial perfusion study 01/01/2011   there a small to moderate sized inferobasal scar   Barrett's esophagus    Cataracts, bilateral     Chronic kidney disease    hx of kidney stones   Claudication (HCC) 11/16/2011   PV test perform shows normal   COPD (chronic obstructive pulmonary disease) (HCC)    Depression    Diabetes (HCC)    type 2 diabetes mellitus   Dysrhythmia    GERD (gastroesophageal reflux disease)    Glaucoma    Hernia of abdominal wall    History of radiation therapy    Right lung, SBRT- 04/16/21-04/30/21- Dr. Antony Blackbird   HTN (hypertension)    Hyperlipidemia    Morbid obesity (HCC)    Myocardial infarction (HCC) 2008,2009,2009   OSA (obstructive sleep apnea)    on cpap   PAF (paroxysmal atrial fibrillation) (HCC)    S/P colonoscopy 2009   3-4 mm transverse colon erosions likely secondary to  ASA   S/P endoscopy 10/2010   moderate erosive gastritis, Barrett's esophagus 1-2cm   Sleep apnea    SOB (shortness of breath) 11/03/2007   2D Echo EF 50%-55%   Stroke (HCC) 2017    Past Surgical History:  Procedure Laterality Date   BIOPSY N/A 04/29/2015   Procedure: BIOPSY;  Surgeon: West Bali, MD;  Location: [facility];  Service: Endoscopy;  Laterality: N/A;   BIOPSY  08/01/2018   Procedure: BIOPSY;  Surgeon: West Bali, MD;  Location: [facility];  Service: Endoscopy;;  esophageal   BIOPSY  10/12/2021   Procedure: BIOPSY;  Surgeon: Lanelle Bal, DO;  [facility]: [facility];  Service: Endoscopy;;   BIOPSY  05/09/2023   Procedure: BIOPSY;  Surgeon: Lanelle Bal, DO;  [facility]: [facility];  Service: Endoscopy;;   CABG X 4  03/2008   CARDIAC CATHETERIZATION  2009   stent placement to the left circumflex a 2.25    CARDIAC CATHETERIZATION  07/08/2010   CARDIAC CATHETERIZATION N/A 11/02/2019   COLONOSCOPY WITH PROPOFOL N/A 09/27/2017   normal ileum, twenty 4 to 8 mm polyps in the sigmoid colon, descending colon, splenic flexure, transverse colon, ascending colon, cecum.  An additional three 2 to 4 mm polyps in the rectum and the descending colon.  Diverticulosis, internal hemorrhoids.   Surgical pathology found the polyps to be one fragment of hyperplastic polyp and 22 fragments of tubular adenoma..  Recommended repeat colonoscop   COLONOSCOPY WITH PROPOFOL N/A 10/12/2021   Procedure: COLONOSCOPY WITH PROPOFOL;  Surgeon: Lanelle Bal, DO;  [facility]: [facility];  Service: Endoscopy;  Laterality: N/A;  1:30pm   CORONARY ARTERY BYPASS GRAFT  2008   4 vessels   CORONARY STENT PLACEMENT  12/29/12   CORONARY STENT PLACEMENT  12/2012   ESOPHAGEAL BRUSHING  05/09/2023   Procedure: ESOPHAGEAL BRUSHING;  Surgeon: Lanelle Bal, DO;  [facility]: [facility];  Service: Endoscopy;;   ESOPHAGEAL DILATION N/A 04/29/2015   Procedure: ESOPHAGEAL DILATION 15 mm, 16 mm;  Surgeon: West Bali, MD;  Location: [facility];  Service: Endoscopy;  Laterality: N/A;   ESOPHAGOGASTRODUODENOSCOPY  09/2011   Barrett's esophagus, no dysplasia on biopsies. Distal esophagitis. Status post dilation. Moderate gastritis and duodenitis, but biopsies benign. Next EGD in November 2015 for surveillance of Barrett's esophagus.   ESOPHAGOGASTRODUODENOSCOPY (EGD) WITH PROPOFOL N/A 04/29/2015   SLF: 1. Barretts esophagus 2. Moderate non-erosive gastritis.    ESOPHAGOGASTRODUODENOSCOPY (EGD) WITH PROPOFOL N/A 08/01/2018   Barrett's, repeat in 5 years. Empiric dilatation, mild gastritis   ESOPHAGOGASTRODUODENOSCOPY (EGD) WITH PROPOFOL N/A 05/09/2023   Procedure: ESOPHAGOGASTRODUODENOSCOPY (EGD) WITH PROPOFOL;  Surgeon: Lanelle Bal, DO;  [facility];  Service: Endoscopy;  Laterality: N/A;  730am, asa 4   HERNIA REPAIR     ventral hernia repair   LEFT HEART CATHETERIZATION WITH CORONARY/GRAFT ANGIOGRAM N/A 12/29/2012   Procedure: LEFT HEART CATHETERIZATION WITH Isabel Caprice;  Surgeon: Lennette Bihari, MD;  [facility];  Service: Cardiovascular;  Laterality: N/A;   ORIF FIBULA FRACTURE Right 06/03/2015   Procedure: OPEN REDUCTION INTERNAL FIXATION (ORIF) DISTAL FIBULA  FRACTURE;   Surgeon: Valeria Batman, MD;  Location: [facility];  Service: Orthopedics;  Laterality: Right;   PERCUTANEOUS CORONARY STENT INTERVENTION (PCI-S)  12/29/2012   Procedure: PERCUTANEOUS CORONARY STENT INTERVENTION (PCI-S);  Surgeon: Lennette Bihari, MD;  Location: [facility] CATH LAB;  Service: Cardiovascular;;   POLYPECTOMY  09/27/2017   Procedure: POLYPECTOMY;  Surgeon: West Bali, MD;  [facility];  Service: Endoscopy;;  cecal polyp, ascending polyps x6, transverse colon polyps x6, splenic flexure polyps x2, descending colon polyps x6, sigmoid  colon polyp x1, rectal polyp x1    POLYPECTOMY  10/12/2021   Procedure: POLYPECTOMY;  Surgeon: Lanelle Bal, DO;  [facility]: [facility];  Service: Endoscopy;;   SAVORY DILATION  09/06/2011   Procedure: SAVORY DILATION;  Surgeon: Arlyce Harman, MD;  Location: [facility];  Service: Endoscopy;  Laterality: N/A;  Dilated with 15mm   SAVORY DILATION N/A 08/01/2018   Procedure: SAVORY DILATION;  Surgeon: West Bali, MD;  Location: [facility];  Service: Endoscopy;  Laterality: N/A;    Family History  Problem Relation Age of Onset   Diabetes Mother    Heart disease Mother    Diabetes Father    Heart disease Father    Pancreatic cancer Brother        age 84, doing well   Colon cancer Neg Hx    Anesthesia problems Neg Hx    Hypotension Neg Hx    Malignant hyperthermia Neg Hx    Pseudochol deficiency Neg Hx     Allergies as  of 10/31/2023 - Review Complete 10/31/2023  Allergen Reaction Noted   Contrast media [iodinated contrast media] Other (See Comments) 03/14/2013   Iohexol Other (See Comments) 01/02/2008    Social History   Socioeconomic History   Marital status: Divorced    Spouse name: Not on file   Number of children: Not on file   Years of education: Not on file   Highest education level: Not on file  Occupational History   Not on file  Tobacco Use   Smoking status: Every Day    Current packs/day: 0.50    Average  packs/day: 0.5 packs/day for 30.0 years (15.0 ttl pk-yrs)    Types: Cigarettes   Smokeless tobacco: Never   Tobacco comments:    Smokes 0.25 packs per day ARJ, 12/29/22  Vaping Use   Vaping status: Never Used  Substance and Sexual Activity   Alcohol use: Yes    Comment: occ beer   Drug use: No   Sexual activity: Never  Other Topics Concern   Not on file  Social History Narrative   Not on file   Social Drivers of Health   Financial Resource Strain: Low Risk  (03/22/2023)   Received from [facility] [address]   Overall Financial Resource Strain (CARDIA)    Difficulty of Paying Living Expenses: Not very hard  Food Insecurity: No Food Insecurity (03/22/2023)   Received from [facility], [facility]   Hunger Vital Sign    Worried About Running Out of Food in the Last Year: Never true    Ran Out of Food in the Last Year: Never true  Transportation Needs: No Transportation Needs (03/22/2023)   [facility], [facility]   PRAPARE - Transportation    Lack of Transportation (Medical): No    Lack of Transportation (Non-Medical): No  Physical Activity: Not on file  Stress: Not on file  Social Connections: Unknown (03/03/2022)   Received from [facility], [facility]   Social Network    Social Network: Not on file     Review of Systems   Gen: Denies fever, chills, anorexia. Denies fatigue, weakness, weight loss.  CV: Denies chest pain, palpitations, syncope, peripheral edema, and claudication. Resp: + Dyspnea on exertion and at rest.  Denies cough, wheezing, coughing up blood, and pleurisy. GI: See HPI Derm: Denies rash, itching, dry skin Psych: Denies depression, anxiety, memory loss, confusion. No homicidal or suicidal ideation.  Heme: Denies bruising, bleeding, and enlarged lymph nodes.  Physical Exam   BP 94/64 (BP Location: Left Arm, Patient Position: Sitting, Cuff Size: Large)   Pulse 78   Temp 98.1 F (36.7 C) (Oral)   Ht 5\' 10"  (1.778 m)    Wt (!) 311 lb 11.2 oz (141.4 kg)   SpO2 91%   BMI 44.72 kg/m   General:   Alert and oriented. No distress noted. Pleasant and cooperative.  Head:  Normocephalic and atraumatic. Eyes:  Conjuctiva clear without scleral icterus. Mouth:  Oral mucosa pink and moist. Good dentition. No lesions. Mild tachypnea. Abdomen:  +BS, soft, rounded, nontender.  Small periumbilical hernia present. No rebound or guarding.  UTA for HSM secondary to body habitus. Rectal: deferred, advised patient if no improvement at next visit then we will pursue Msk:  Symmetrical without gross deformities. Normal posture. Neurologic:  Alert and  oriented x4 Psych:  Alert and cooperative. Normal mood and affect.  Assessment  [name] is a 62 y.o. male with a history of Barrett's esophagus,  GERD, MI in 2000 06/2008, COPD, A-fib on Eliquis, HTN, HLD, OSA on CPAP, and stroke in 2017 presenting today for follow-up of GERD, constipation.  GERD, Barrett's esophagus, dysphagia: Has some occasional breakthrough reflux symptoms despite pantoprazole 40 mg twice daily.  Suspect some of this is related to body habitus.  Also struggle somewhat with some dysphagia.  EGD in July of this year with some esophagitis, gastritis, and a small hiatal hernia.  We will continue PPI twice daily and advised on dysphagia precautions today.  He states his swallowing issues do improve with use of Valium.  Constipation, mild fecal leakage/soiling: Tried MiraLAX once daily but stools remain hard as a rock.  He has tried fiber supplementation as well without any good improvement.  He still may go 2-3 times in the mornings but they are small in nature and does have to strain.  Will treat with prescription laxative and will trial Linzess to start with.  We can titrate dose up or down if needed.  He was extensively counseled on the washout period.  Denies any abdominal pain.  His previous complaint of some fecal soiling is likely secondary to hemorrhoids  as well.  Rectal bleeding: Presume this is secondary to hemorrhoids given he has been struggling with constipation.  No rectal exam performed today but advised patient that I will prescribe him some Anusol rectal cream to use twice daily for 1 week and if he continues to have rectal bleeding then we will plan for rectal exam at follow-up.  Last colonoscopy in 2022 with evidence of internal hemorrhoids.  Denies any syncope, lightheadedness, or dizziness.  Does have shortness of breath but this is at baseline given his COPD.  He would not be a banding candidate given he is on chronic anticoagulation.  PLAN   Linzess 145 mcg daily. Samples provided.Advised on washout period.   Continue pantoprazole 40 mg twice daily.  Anusol BID for 1 week Increase water intake.  Dysphagia precautions.  Follow up in 6 weeks, potential for rectal exam and CBC/iron panel   Brooke Bonito, MSN, FNP-BC, AGACNP-BC [facility]

## 2023-11-03 ENCOUNTER — Telehealth (INDEPENDENT_AMBULATORY_CARE_PROVIDER_SITE_OTHER): Payer: Self-pay

## 2023-11-03 NOTE — Telephone Encounter (Signed)
 Patient called and stated that he wanted Dr. Karis to refill his diazepam  5mg . He stated the bottle said he had 3 refills and that he was at [facility] and the pharmacy needed a request. Then he stated he wanted this medication to go to [facility] in [place]. Told patient Dr. Karis cannot prescribe the medication he can call his pcp to see if he needed it all the time. They would manage that.

## 2023-11-04 ENCOUNTER — Telehealth (INDEPENDENT_AMBULATORY_CARE_PROVIDER_SITE_OTHER): Payer: Self-pay

## 2023-11-04 NOTE — Telephone Encounter (Signed)
 Patient wants diazepam sent to [facility] in [place]. Notified patient that Dr. Suszanne Conners will not be in until Monday he will address this issue when he comes back.

## 2023-11-07 ENCOUNTER — Telehealth (INDEPENDENT_AMBULATORY_CARE_PROVIDER_SITE_OTHER): Payer: Self-pay | Admitting: Otolaryngology

## 2023-11-07 NOTE — Telephone Encounter (Signed)
 Called patient back and Dr. Suszanne Conners called in his diazepam 5 mg to [facility] in [place]

## 2023-11-07 NOTE — Telephone Encounter (Signed)
 Patient called and requested a refill on medication Diazepam to be sent to [facility] in [place]. Address is [address]

## 2023-11-09 DIAGNOSIS — E538 Deficiency of other specified B group vitamins: Secondary | ICD-10-CM | POA: Diagnosis not present

## 2023-11-13 NOTE — Progress Notes (Signed)
 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  Rec Make sure you check your oxygen  saturation  AT  your highest level of activity (not after you stop)   to be sure it stays over 90%  Stop lisinopril  and start valsartan  80 mg daily  Please schedule a follow up office visit in 6 weeks, call sooner if needed with all medications /inhalers/ oxygen  solutions in hand   11/14/2023  f/u ov/[name] office/[name] re: MO with [facility]   maint on trelegy   did not  bring meds / still smoking  Chief Complaint  Patient presents with   Shortness of Breath  Dyspnea:  no change = 75 ft  Cough: harsh/ slt rattle upper airway pattern  Sleeping: flat bed 3 pillows  plugs in 02 to  cpap at 3.5  SABA use: 2 x daily plus prn duoneb  02: 3lpm at rest but came here today s 02    No obvious day to day or daytime variability or assoc excess/ purulent sputum or mucus plugs or hemoptysis or cp or chest tightness, subjective wheeze or overt sinus or hb symptoms.    Also denies any obvious fluctuation of symptoms with weather or environmental changes or other aggravating  or alleviating factors except as outlined above   No unusual exposure hx or h/o childhood pna/ asthma or knowledge of premature birth.  Current Allergies, Complete Past Medical History, Past Surgical History, Family History, and Social History were reviewed in [employer] record.  ROS  The following are not active complaints unless bolded Hoarseness, sore throat, dysphagia, dental problems, itching, sneezing,  nasal congestion or discharge of excess mucus or purulent secretions, ear ache,   fever, chills, sweats, unintended wt loss or wt gain, classically pleuritic or exertional cp,  orthopnea pnd or arm/hand swelling  or leg swelling, presyncope, palpitations, abdominal pain, anorexia, nausea, vomiting, diarrhea  or change in bowel habits or change in bladder habits, change in stools or change in urine, dysuria, hematuria,  rash, arthralgias, visual complaints, headache, numbness, weakness or ataxia or problems with walking or coordination,  change in mood or  memory.        Current Meds  Medication Sig   acetaminophen  (TYLENOL ) 500 MG tablet Take 500 mg by mouth every 6 (six) hours as needed for moderate pain.   albuterol  (VENTOLIN  HFA) 108 (90 Base) MCG/ACT inhaler  Inhale 1-2 puffs into the lungs every 6 (six) hours as needed for wheezing or shortness of breath.   apixaban  (ELIQUIS ) 5 MG TABS tablet Take 1 tablet (5 mg total) by mouth 2 (two) times daily.   benzonatate  (TESSALON ) 200 MG capsule Take 200 mg by mouth 3 (three) times daily as needed.   blood glucose meter kit and supplies Dispense based on patient and insurance preference. Use up to two times daily as directed. (FOR ICD-E11.65)   Blood Glucose Monitoring Suppl (ONETOUCH VERIO) w/Device KIT 1 each by Does not apply route as needed.   Cholecalciferol (VITAMIN D ) 50 MCG (2000 UT) tablet Take 2,000 Units by mouth daily.   Continuous Glucose Receiver (FREESTYLE LIBRE 3 READER) DEVI 1 Piece by Does not apply route  once as needed for up to 1 dose.   Continuous Glucose Sensor (FREESTYLE LIBRE 3 PLUS SENSOR) MISC Change sensor every 15 days.   Cyanocobalamin  (VITAMIN B-12 IJ) Inject 1,000 mcg as directed every 30 (thirty) days.   diazepam  (VALIUM ) 5 MG tablet Take 5 mg by mouth 2 (two) times daily.   diclofenac Sodium (VOLTAREN) 1 % GEL Apply 1 Application topically 4 (four) times daily as needed (pain).   Dulaglutide  (TRULICITY ) 4.5 MG/0.5ML SOAJ Inject 4.5 mg as directed once a week.   DULoxetine (CYMBALTA) 30 MG capsule Take 30 mg by mouth daily.   empagliflozin  (JARDIANCE ) 10 MG TABS tablet Take 1 tablet (10 mg total) by mouth daily before breakfast.   escitalopram  (LEXAPRO ) 20 MG tablet Take 20 mg by mouth in the morning.   fenofibrate  (TRICOR ) 145 MG tablet Take 145 mg by mouth in the morning.   Fluticasone -Umeclidin-Vilant (TRELEGY ELLIPTA ) 100-62.5-25 MCG/ACT AEPB Inhale 1 puff into the lungs daily.   furosemide  (LASIX ) 40 MG tablet Take 40 mg by mouth daily.   gabapentin  (NEURONTIN ) 300 MG capsule Take 300 mg by mouth 3 (three) times daily.   hydrocortisone  (ANUSOL -HC) 2.5 % rectal cream Place 1 Application rectally 2 (two) times daily.   Insulin  Pen Needle (B-D ULTRAFINE III SHORT PEN) 31G X 8 MM MISC Use as directed three time daily to inject insulin    insulin  regular human CONCENTRATED (HUMULIN  R U-500 KWIKPEN) 500 UNIT/ML KwikPen Inject 70 Units into the skin 3 (three) times daily with meals. Only when blood glucose is above 90 and eating.   ipratropium-albuterol  (DUONEB) 0.5-2.5 (3) MG/3ML SOLN Take 3 mLs by nebulization every 6 (six) hours as needed.   isosorbide  mononitrate (IMDUR ) 120 MG 24 hr tablet Take 120 mg by mouth in the morning.   Lancets (ONETOUCH DELICA PLUS LANCET33G) MISC USE TO check blood sugar FOUR TIMES DAILY AS DIRECTED   meclizine  (ANTIVERT ) 25 MG tablet Take 25 mg by mouth every 6 (six) hours as needed for dizziness or nausea.   metoprolol  tartrate (LOPRESSOR ) 100 MG tablet  Take 1 tablet (100 mg total) by mouth 2 (two) times daily. OFFICE VISIT NEEDED BEFORE ADDITIONAL REFILLS   modafinil  (PROVIGIL ) 200 MG tablet Take 200 mg by mouth daily.   mupirocin ointment (BACTROBAN) 2 % Apply 1 Application topically 2 (two) times daily.   nitroGLYCERIN  (NITROLINGUAL ) 0.4 MG/SPRAY spray Place 1 spray under the tongue every 5 (five) minutes x 3 doses as needed for chest pain.   ONETOUCH VERIO test strip USE TO check blood sugar FOUR TIMES DAILY AS DIRECTED   pantoprazole  (PROTONIX ) 40 MG tablet Take 1 tablet (40 mg total) by mouth 2 (two) times daily before a meal.  polyethylene glycol powder (GLYCOLAX /MIRALAX ) 17 GM/SCOOP powder Take 17 g once daily in 8 ounces of water .   potassium chloride  SA (KLOR-CON  M) 20 MEQ tablet Take 1 tablet (20 mEq total) by mouth daily. While Taking Lasix /Furosemide  (Patient taking differently: Take 20 mEq by mouth 2 (two) times daily.)   rosuvastatin  (CRESTOR ) 20 MG tablet Take 20 mg by mouth in the morning.   valsartan  (DIOVAN ) 80 MG tablet Take 1 tablet (80 mg total) by mouth daily.            Past Medical History:  Diagnosis Date   Abnormal myocardial perfusion study 01/01/2011   there a small to moderate sized inferobasal scar   Barrett's esophagus    Cataracts, bilateral    Chronic kidney disease    hx of kidney stones   Claudication (HCC) 11/16/2011   PV test perform shows normal   COPD (chronic obstructive pulmonary disease) (HCC)    Depression    Diabetes (HCC)    type 2 diabetes mellitus   Dysrhythmia    GERD (gastroesophageal reflux disease)    Glaucoma    Hernia of abdominal wall    History of radiation therapy    Right lung, SBRT- 04/16/21-04/30/21- Dr. Lynwood Nasuti   HTN (hypertension)    Hyperlipidemia    Morbid obesity (HCC)    Myocardial infarction (HCC) 2008,2009,2009   OSA (obstructive sleep apnea)    on cpap   PAF (paroxysmal atrial fibrillation) (HCC)    S/P colonoscopy 2009   3-4 mm transverse colon  erosions likely secondary to  ASA   S/P endoscopy 10/2010   moderate erosive gastritis, Barrett's esophagus 1-2cm   Sleep apnea    SOB (shortness of breath) 11/03/2007   2D Echo EF 50%-55%   Stroke (HCC) 2017      Objective:     Wt Readings from Last 3 Encounters:  11/14/23 (!) 308 lb (139.7 kg)  10/31/23 (!) 311 lb 11.2 oz (141.4 kg)  10/04/23 (!) 303 lb 9.6 oz (137.7 kg)     Vital signs reviewed  11/14/2023  - Note on arrival  02 sats  88% on RA and 91% p sitting   General appearance:    MO (by BMI ) am wm, very slow mentation   very prominent pseudowheeze    HEENT : Oropharynx  clear         NECK :  very short without  apparent JVD/ palpable Nodes/TM    LUNGS: no acc muscle use,  Nl contour chest with lots of transmitter upper airway breath sounds    CV:  RRR  no s3 or murmur or increase in P2, and no edema   ABD:  Massively obese soft and nontender   MS:   ext warm without deformities Or obvious joint restrictions  calf tenderness, cyanosis or clubbing    SKIN: warm and dry without lesions    NEURO:  alert, approp, nl sensorium with  no motor or cerebellar deficits apparent.           Assessment

## 2023-11-14 ENCOUNTER — Ambulatory Visit: Payer: HMO | Admitting: Internal Medicine

## 2023-11-14 ENCOUNTER — Encounter: Payer: Self-pay | Admitting: Internal Medicine

## 2023-11-14 VITALS — BP 119/78 | HR 96 | Ht 70.0 in | Wt 308.0 lb

## 2023-11-14 DIAGNOSIS — F17200 Nicotine dependence, unspecified, uncomplicated: Secondary | ICD-10-CM

## 2023-11-14 DIAGNOSIS — R0609 Other forms of dyspnea: Secondary | ICD-10-CM

## 2023-11-14 DIAGNOSIS — J9611 Chronic respiratory failure with hypoxia: Secondary | ICD-10-CM | POA: Diagnosis not present

## 2023-11-14 MED ORDER — ALBUTEROL SULFATE HFA 108 (90 BASE) MCG/ACT IN AERS: 1.0000 | INHALATION_SPRAY | RESPIRATORY_TRACT | 11 refills | Status: AC | PRN

## 2023-11-14 MED ORDER — IPRATROPIUM-ALBUTEROL 0.5-2.5 (3) MG/3ML IN SOLN: 3.0000 mL | Freq: Four times a day (QID) | RESPIRATORY_TRACT | 11 refills | Status: AC | PRN

## 2023-11-14 NOTE — Assessment & Plan Note (Signed)
 Active smoker  - PFT's  03/20/20  FEV1 2.4 (54 % ) ratio 0.74  p 7 % improvement from saba p nothing  prior to study with  and FV curve min concavity  and ERV 25% at wt 303    - 10/03/2023 d/c acei due to prominent pseudowheeze > no change 11/14/2023 so d/c trelegy and rx duoneb up to qid / continue ppi bid ac  Continues with prominent pseudowheeze which might be related to DPI inhaler so reasonable just to try duoneb up quid and the saba prn when out  - The proper method of use, as well as anticipated side effects, of a metered-dose inhaler were discussed and demonstrated to the patient using teach back method. Improved effectiveness after extensive coaching during this visit to a level of approximately 5 % from a baseline of 25%  % with short Ti the issue

## 2023-11-14 NOTE — Assessment & Plan Note (Addendum)
 HC03  08/09/23   = 31   So likley borderline hypercarbic typical of OHS   Rec target sats low 90s on or off 02  With activity:  Make sure you check your oxygen  saturation  AT  your highest level of activity (not after you stop)   to be sure it stays over 90% and adjust  02 flow upward to maintain this level if needed but remember to turn it back to previous settings when you stop (to conserve your supply).   Addendum:  his sats on arrival were 88% RA so he needs  2lpm walking and  3lpm at hs thru the cpap machine  then we'll do an ONO on the cpap machine settings to verify it's the right settings.  He'll need referral then for best fit for portable 02            Each maintenance medication was reviewed in detail including emphasizing most importantly the difference between maintenance and prns and under what circumstances the prns are to be triggered using an action plan format where appropriate.  Total time for H and P, chart review, counseling, reviewing hfa/dpi/neb/02 /pulse ox  device(s) and generating customized AVS unique to this office visit / same day charting > 30 min for  refractory respiratory  symptoms of uncertain etiology

## 2023-11-14 NOTE — Assessment & Plan Note (Signed)
 4-5 min discussion re active cigarette smoking in addition to office E&M  Ask about tobacco use:  ongoing Advise quitting    matter of life or breath, esp given 02 dependence and danger of fire  Assess willingness:  Not committed at this point Assist in quit attempt:  Per PCP when ready Arrange follow up:   Follow up per Primary Care planned

## 2023-11-14 NOTE — Assessment & Plan Note (Addendum)
 PFT's 03/20/20 ERV 25% at wt 303   Body mass index is 44.19 kg/m.  -  trending slt down Lab Results  Component Value Date   TSH 1.400 04/26/2022      Contributing to doe and risk of GERD/dvt/ PE  >>>   reviewed the need and the process to achieve and maintain neg calorie balance > defer f/u primary care including intermittently monitoring thyroid  status

## 2023-11-14 NOTE — Patient Instructions (Addendum)
 Stop trelegy   Change duoneb can be taken up to 4 x daily - especially before you go out (lasts about 4 hours )   Instead of metaprolol I strongly  prefer you change to  Bisoprolol adjusted to desirable blood pressure - show this instruction to your heart doctor at [facility]   We will refer you to [facility] for a better portable system for you and in meantime:  Make sure you check your oxygen  saturation  AT  your highest level of activity (not after you stop)   to be sure it stays over 90% and adjust  02 flow upward to maintain this level if needed but remember to turn it back to previous settings when you stop (to conserve your supply).    Please schedule a follow up visit in 3 months but call sooner if needed with NP for cpap f/u and wear your portable 02 to office

## 2023-11-15 ENCOUNTER — Telehealth: Payer: Self-pay

## 2023-11-15 ENCOUNTER — Other Ambulatory Visit (HOSPITAL_COMMUNITY): Payer: Self-pay

## 2023-11-15 NOTE — Telephone Encounter (Signed)
 Pharmacy Patient Advocate Encounter   Received notification from CoverMyMeds that prior authorization for Ipratropium-Albuterol  0.5-2.5 (3)MG/3ML solution  is required/requested.   Insurance verification completed.   The patient is insured through [facility] ADV[employer]/RX ADVANCE .   Per test claim: The current 30 day co-pay is, $4.15.  No PA needed at this time. This test claim was processed through [facility]- copay amounts may vary at other pharmacies due to pharmacy/plan contracts, or as the patient moves through the different stages of their insurance plan.

## 2023-11-17 ENCOUNTER — Telehealth: Payer: Self-pay | Admitting: Internal Medicine

## 2023-11-17 NOTE — Telephone Encounter (Signed)
[employer] calling to verify where this PT resides:  [facility]  Medication is :  ipratropium-albuterol (DUONEB) 0.5-2.5 (3) MG/3ML SOLN   [phone] Option 2  Req ID: [id number]

## 2023-11-18 ENCOUNTER — Telehealth: Payer: Self-pay | Admitting: Internal Medicine

## 2023-11-18 NOTE — Telephone Encounter (Signed)
[facility] requires a new Rx, new sats, and provider acknowledgement note/written specifically on Rx/cosigned that they reviewed the sats. Before they can process this order. This is for the Dme order, Referral:[id number].

## 2023-11-22 NOTE — Telephone Encounter (Signed)
Refill was sent on 11/14/23

## 2023-11-23 DIAGNOSIS — Z951 Presence of aortocoronary bypass graft: Secondary | ICD-10-CM | POA: Diagnosis not present

## 2023-11-23 DIAGNOSIS — J449 Chronic obstructive pulmonary disease, unspecified: Secondary | ICD-10-CM | POA: Diagnosis not present

## 2023-11-23 DIAGNOSIS — I25810 Atherosclerosis of coronary artery bypass graft(s) without angina pectoris: Secondary | ICD-10-CM | POA: Diagnosis not present

## 2023-11-23 DIAGNOSIS — Z1330 Encounter for screening examination for mental health and behavioral disorders, unspecified: Secondary | ICD-10-CM | POA: Diagnosis not present

## 2023-11-23 DIAGNOSIS — I480 Paroxysmal atrial fibrillation: Secondary | ICD-10-CM | POA: Diagnosis not present

## 2023-11-23 NOTE — Telephone Encounter (Signed)
NFN 

## 2023-11-23 NOTE — Telephone Encounter (Signed)
Ronnie Cowden, MD to Me     11/18/23  3:37 PM We did not walk him at last and I wasn't sure what he needed walking vs sleeping but I addended his last note with specific recs under problem chronic respiratory failure  Will this do or is there something else?

## 2023-11-24 DIAGNOSIS — F332 Major depressive disorder, recurrent severe without psychotic features: Secondary | ICD-10-CM | POA: Diagnosis not present

## 2023-11-24 DIAGNOSIS — I209 Angina pectoris, unspecified: Secondary | ICD-10-CM | POA: Diagnosis not present

## 2023-11-24 DIAGNOSIS — Z794 Long term (current) use of insulin: Secondary | ICD-10-CM | POA: Diagnosis not present

## 2023-11-24 DIAGNOSIS — I110 Hypertensive heart disease with heart failure: Secondary | ICD-10-CM | POA: Diagnosis not present

## 2023-11-24 DIAGNOSIS — J449 Chronic obstructive pulmonary disease, unspecified: Secondary | ICD-10-CM | POA: Diagnosis not present

## 2023-11-24 DIAGNOSIS — I4891 Unspecified atrial fibrillation: Secondary | ICD-10-CM | POA: Diagnosis not present

## 2023-11-24 DIAGNOSIS — E261 Secondary hyperaldosteronism: Secondary | ICD-10-CM | POA: Diagnosis not present

## 2023-11-24 DIAGNOSIS — D6869 Other thrombophilia: Secondary | ICD-10-CM | POA: Diagnosis not present

## 2023-11-24 DIAGNOSIS — E1169 Type 2 diabetes mellitus with other specified complication: Secondary | ICD-10-CM | POA: Diagnosis not present

## 2023-11-24 DIAGNOSIS — E1151 Type 2 diabetes mellitus with diabetic peripheral angiopathy without gangrene: Secondary | ICD-10-CM | POA: Diagnosis not present

## 2023-11-24 DIAGNOSIS — E1142 Type 2 diabetes mellitus with diabetic polyneuropathy: Secondary | ICD-10-CM | POA: Diagnosis not present

## 2023-11-24 NOTE — Telephone Encounter (Signed)
Yeah that's fine. They have one last question. Are they able to do the POC/best fit in their office? They just want to make sure.

## 2023-11-25 DIAGNOSIS — Z6841 Body Mass Index (BMI) 40.0 and over, adult: Secondary | ICD-10-CM | POA: Diagnosis not present

## 2023-11-25 DIAGNOSIS — I5030 Unspecified diastolic (congestive) heart failure: Secondary | ICD-10-CM | POA: Diagnosis not present

## 2023-11-25 DIAGNOSIS — I480 Paroxysmal atrial fibrillation: Secondary | ICD-10-CM | POA: Diagnosis not present

## 2023-11-25 DIAGNOSIS — D6869 Other thrombophilia: Secondary | ICD-10-CM | POA: Diagnosis not present

## 2023-11-25 DIAGNOSIS — U099 Post covid-19 condition, unspecified: Secondary | ICD-10-CM | POA: Diagnosis not present

## 2023-11-25 DIAGNOSIS — I5040 Unspecified combined systolic (congestive) and diastolic (congestive) heart failure: Secondary | ICD-10-CM | POA: Diagnosis not present

## 2023-11-25 DIAGNOSIS — J069 Acute upper respiratory infection, unspecified: Secondary | ICD-10-CM | POA: Diagnosis not present

## 2023-11-25 DIAGNOSIS — I130 Hypertensive heart and chronic kidney disease with heart failure and stage 1 through stage 4 chronic kidney disease, or unspecified chronic kidney disease: Secondary | ICD-10-CM | POA: Diagnosis not present

## 2023-11-25 DIAGNOSIS — J449 Chronic obstructive pulmonary disease, unspecified: Secondary | ICD-10-CM | POA: Diagnosis not present

## 2023-11-25 DIAGNOSIS — Z794 Long term (current) use of insulin: Secondary | ICD-10-CM | POA: Diagnosis not present

## 2023-12-05 ENCOUNTER — Other Ambulatory Visit: Payer: Self-pay | Admitting: "Endocrinology

## 2023-12-05 DIAGNOSIS — J449 Chronic obstructive pulmonary disease, unspecified: Secondary | ICD-10-CM | POA: Diagnosis not present

## 2023-12-12 ENCOUNTER — Telehealth: Payer: Self-pay

## 2023-12-12 MED ORDER — BENZONATATE 200 MG PO CAPS: 200.0000 mg | ORAL_CAPSULE | Freq: Three times a day (TID) | ORAL | 0 refills | Status: DC | PRN

## 2023-12-12 NOTE — Telephone Encounter (Signed)
 ok

## 2023-12-12 NOTE — Progress Notes (Signed)
GI Office Note    Referring Provider: Assunta Found, MD Primary Care Physician:  Assunta Found, MD Primary Gastroenterologist: Hennie Duos. [name], DO  Date:  12/13/2023  ID:  CROSLEY STEJSKAL, DOB [birth year], MRN [id number]   Chief Complaint   Chief Complaint  Patient presents with   Follow-up    Follow up. No problems    History of Present Illness  [name] is a 63 y.o. male with a history of Barrett's esophagus, GERD, MI in 2000 06/2008, COPD, A-fib on Eliquis, HTN, HLD, OSA on CPAP, and stroke in 2017 presenting today for follow-up of GERD, constipation.   EGD 2019: -normal esophagus s/p dilation -mild gastritis -repeat in 5 years   Colonoscopy 10/12/21: - Preparation of the colon was fair. - Non-bleeding internal hemorrhoids. - One 2 mm polyp in the transverse colon - Three 4 to 6 mm polyps in the descending colon. - The examination was otherwise normal. - Repeat in 5 years.    OV 03/24/23. Recently with low bp. On O2 nightly with CPAP, some O2 during day. SOB at baseline. Rectal leakage 1-2 times per week. Reflux well controlled on medication. Currently on valium to help with choking sensation/dysphagia. Rare toilet tissue hematochezia. Advised fiber supplement to help bulk stools. Rectal exam offered however deferred by patient.    EGD 05/09/23: -small hiatal hernia -esophageal changes secondary to known barrett's disease sp biopsy -gastritis s/p biopsy -normal duodenum -white nummular lesions in esophagus -Path: reactive gastropathy, reflux esophagitis -KOH negative -Advised PPI BID -Surveillance EGD in 5 years   A1c 9.2 on 7/16   OV 08/01/23.  Feeling of food stuck a lot when eating, feeling like his throat shuts off.  This can occur randomly at times even without eating.  Taking Valium to help with this.  Does have some dry heaves but not nauseous.  Tried some fiber but had difficulties with cost, sometimes going loose and sometimes it hurts and has to  strain to go feels like a knife at times when it is hard.  Has some leakage at night.  In the mornings goes 2-3 times within an hour and a half.  Rare toilet tissue hematochezia.  Referred to dermatology given wound on his back.  Advised continue pantoprazole 40 mg twice daily.  Advised to continue fiber supplementation, given samples of Metamucil.  Advised to start MiraLAX once daily, follow-up in 3 months.  Last office visit 10/31/23. Discussed constipation, fecal leakage/soiling, reflux, dysphagia, rectal bleeding. Provided samples of Linzess, advised on washout period.  Continue PPI twice daily.  Use Anusol twice daily for 1 week.  Increase water intake.  Dysphagia precautions advised.  Discussed potential rectal exam and CBC with iron in 6 weeks.     Latest Ref Rng & Units 08/29/2023    9:24 PM 10/14/2022    4:15 AM 10/13/2022    4:34 AM  CBC  WBC 4.0 - 10.5 K/uL 14.1  15.2  3.4   Hemoglobin 13.0 - 17.0 g/dL 78.2  95.6  21.3   Hematocrit 39.0 - 52.0 % 47.5  49.3  47.5   Platelets 150 - 400 K/uL 279  313  215      Today:  He needs medications to be cheaper. Has been using [facility]. Still having accidents in his underwear. No brbpr. Swallowing is okay with his valium but sometimes that does not help. Even liquids are hard to swallow. Denies nausea but does state some dry heaving at times.  Stays short of breath at baseline. He states he has oxygen to take with him but battery dies within a few hours so he does not usually take it with him.   Goes regularly with Bms, getting a little easier when he was tacking the medication. Had 3 BM this morning.   Wt Readings from Last 3 Encounters:  12/13/23 (!) 307 lb 6.4 oz (139.4 kg)  11/14/23 (!) 308 lb (139.7 kg)  10/31/23 (!) 311 lb 11.2 oz (141.4 kg)    Current Outpatient Medications  Medication Sig Dispense Refill   acetaminophen (TYLENOL) 500 MG tablet Take 500 mg by mouth every 6 (six) hours as needed for moderate pain.      albuterol (VENTOLIN HFA) 108 (90 Base) MCG/ACT inhaler Inhale 1-2 puffs into the lungs every 4 (four) hours as needed for wheezing or shortness of breath. 18 g 11   apixaban (ELIQUIS) 5 MG TABS tablet Take 1 tablet (5 mg total) by mouth 2 (two) times daily. 60 tablet 1   benzonatate (TESSALON) 200 MG capsule Take 1 capsule (200 mg total) by mouth 3 (three) times daily as needed. 45 capsule 0   blood glucose meter kit and supplies Dispense based on patient and insurance preference. Use up to two times daily as directed. (FOR ICD-E11.65) 1 each 5   Blood Glucose Monitoring Suppl (ONETOUCH VERIO) w/Device KIT 1 each by Does not apply route as needed. 1 kit 0   Cholecalciferol (VITAMIN D) 50 MCG (2000 UT) tablet Take 2,000 Units by mouth daily.     Continuous Glucose Receiver (FREESTYLE LIBRE 3 READER) DEVI 1 Piece by Does not apply route once as needed for up to 1 dose. 1 each 0   Continuous Glucose Sensor (FREESTYLE LIBRE 3 PLUS SENSOR) MISC Change sensor every 15 days. 2 each 2   Cyanocobalamin (VITAMIN B-12 IJ) Inject 1,000 mcg as directed every 30 (thirty) days.     diazepam (VALIUM) 5 MG tablet Take 5 mg by mouth 2 (two) times daily.     diclofenac Sodium (VOLTAREN) 1 % GEL Apply 1 Application topically 4 (four) times daily as needed (pain).     Dulaglutide (TRULICITY) 4.5 MG/0.5ML SOAJ Inject 4.5 mg as directed once a week. 6 mL 1   DULoxetine (CYMBALTA) 30 MG capsule Take 30 mg by mouth daily.     empagliflozin (JARDIANCE) 10 MG TABS tablet Take 1 tablet (10 mg total) by mouth daily before breakfast. 90 tablet 1   escitalopram (LEXAPRO) 20 MG tablet Take 20 mg by mouth in the morning.     fenofibrate (TRICOR) 145 MG tablet Take 145 mg by mouth in the morning.     Fluticasone-Umeclidin-Vilant (TRELEGY ELLIPTA) 100-62.5-25 MCG/ACT AEPB Inhale 1 puff into the lungs daily. 60 each 2   furosemide (LASIX) 40 MG tablet Take 40 mg by mouth daily.     gabapentin (NEURONTIN) 300 MG capsule Take 300 mg by  mouth 3 (three) times daily.     hydrocortisone (ANUSOL-HC) 2.5 % rectal cream Place 1 Application rectally 2 (two) times daily. 30 g 1   Insulin Pen Needle (B-D ULTRAFINE III SHORT PEN) 31G X 8 MM MISC Use as directed three time daily to inject insulin 300 each 1   insulin regular human CONCENTRATED (HUMULIN R U-500 KWIKPEN) 500 UNIT/ML KwikPen Inject 70 Units into the skin 3 (three) times daily with meals. Only when blood glucose is above 90 and eating. 36 mL 0   ipratropium-albuterol (DUONEB) 0.5-2.5 (3) MG/3ML  SOLN Take 3 mLs by nebulization every 6 (six) hours as needed. 360 mL 11   isosorbide mononitrate (IMDUR) 120 MG 24 hr tablet Take 120 mg by mouth in the morning.     Lancets (ONETOUCH DELICA PLUS LANCET33G) MISC USE TO check blood sugar FOUR TIMES DAILY AS DIRECTED 200 each 1   meclizine (ANTIVERT) 25 MG tablet Take 25 mg by mouth every 6 (six) hours as needed for dizziness or nausea.     metoprolol tartrate (LOPRESSOR) 100 MG tablet Take 1 tablet (100 mg total) by mouth 2 (two) times daily. OFFICE VISIT NEEDED BEFORE ADDITIONAL REFILLS 28 tablet 0   modafinil (PROVIGIL) 200 MG tablet Take 200 mg by mouth daily.     mupirocin ointment (BACTROBAN) 2 % Apply 1 Application topically 2 (two) times daily.     nitroGLYCERIN (NITROLINGUAL) 0.4 MG/SPRAY spray Place 1 spray under the tongue every 5 (five) minutes x 3 doses as needed for chest pain. 12 g 0   ONETOUCH VERIO test strip USE TO check blood sugar FOUR TIMES DAILY AS DIRECTED 200 strip 1   pantoprazole (PROTONIX) 40 MG tablet Take 1 tablet (40 mg total) by mouth 2 (two) times daily before a meal. 180 tablet 3   polyethylene glycol powder (GLYCOLAX/MIRALAX) 17 GM/SCOOP powder Take 17 g once daily in 8 ounces of water. 238 g 0   potassium chloride SA (KLOR-CON M) 20 MEQ tablet Take 1 tablet (20 mEq total) by mouth daily. While Taking Lasix/Furosemide (Patient taking differently: Take 20 mEq by mouth 2 (two) times daily.) 30 tablet 0    rosuvastatin (CRESTOR) 20 MG tablet Take 20 mg by mouth in the morning.     valsartan (DIOVAN) 80 MG tablet Take 1 tablet (80 mg total) by mouth daily. 30 tablet 11   No current facility-administered medications for this visit.    Past Medical History:  Diagnosis Date   Abnormal myocardial perfusion study 01/01/2011   there a small to moderate sized inferobasal scar   Barrett's esophagus    Cataracts, bilateral    Chronic kidney disease    hx of kidney stones   Claudication (HCC) 11/16/2011   PV test perform shows normal   COPD (chronic obstructive pulmonary disease) (HCC)    Depression    Diabetes (HCC)    type 2 diabetes mellitus   Dysrhythmia    GERD (gastroesophageal reflux disease)    Glaucoma    Hernia of abdominal wall    History of radiation therapy    Right lung, SBRT- 04/16/21-04/30/21- Dr. Antony Blackbird   HTN (hypertension)    Hyperlipidemia    Morbid obesity (HCC)    Myocardial infarction (HCC) 2008,2009,2009   OSA (obstructive sleep apnea)    on cpap   PAF (paroxysmal atrial fibrillation) (HCC)    S/P colonoscopy 2009   3-4 mm transverse colon erosions likely secondary to  ASA   S/P endoscopy 10/2010   moderate erosive gastritis, Barrett's esophagus 1-2cm   Sleep apnea    SOB (shortness of breath) 11/03/2007   2D Echo EF 50%-55%   Stroke (HCC) 2017    Past Surgical History:  Procedure Laterality Date   BIOPSY N/A 04/29/2015   Procedure: BIOPSY;  Surgeon: West Bali, MD;  Location: [facility];  Service: Endoscopy;  Laterality: N/A;   BIOPSY  08/01/2018   Procedure: BIOPSY;  Surgeon: West Bali, MD;  Location: [facility];  Service: Endoscopy;;  esophageal   BIOPSY  10/12/2021  Procedure: BIOPSY;  Surgeon: Lanelle Bal, DO;  Location: [facility];  Service: Endoscopy;;   BIOPSY  05/09/2023   Procedure: BIOPSY;  Surgeon: Lanelle Bal, DO;  [facility]: [facility];  Service: Endoscopy;;   CABG X 4  03/2008   CARDIAC CATHETERIZATION  2009    stent placement to the left circumflex a 2.25    CARDIAC CATHETERIZATION  07/08/2010   CARDIAC CATHETERIZATION N/A 11/02/2019   COLONOSCOPY WITH PROPOFOL N/A 09/27/2017   normal ileum, twenty 4 to 8 mm polyps in the sigmoid colon, descending colon, splenic flexure, transverse colon, ascending colon, cecum.  An additional three 2 to 4 mm polyps in the rectum and the descending colon.  Diverticulosis, internal hemorrhoids.  Surgical pathology found the polyps to be one fragment of hyperplastic polyp and 22 fragments of tubular adenoma..  Recommended repeat colonoscop   COLONOSCOPY WITH PROPOFOL N/A 10/12/2021   Procedure: COLONOSCOPY WITH PROPOFOL;  Surgeon: Lanelle Bal, DO;  Location: [facility];  Service: Endoscopy;  Laterality: N/A;  1:30pm   CORONARY ARTERY BYPASS GRAFT  2008   4 vessels   CORONARY STENT PLACEMENT  12/29/12   CORONARY STENT PLACEMENT  12/2012   ESOPHAGEAL BRUSHING  05/09/2023   Procedure: ESOPHAGEAL BRUSHING;  Surgeon: Lanelle Bal, DO;  [facility]: [facility];  Service: Endoscopy;;   ESOPHAGEAL DILATION N/A 04/29/2015   Procedure: ESOPHAGEAL DILATION 15 mm, 16 mm;  Surgeon: West Bali, MD;  Location: [facility];  Service: Endoscopy;  Laterality: N/A;   ESOPHAGOGASTRODUODENOSCOPY  09/2011   Barrett's esophagus, no dysplasia on biopsies. Distal esophagitis. Status post dilation. Moderate gastritis and duodenitis, but biopsies benign. Next EGD in November 2015 for surveillance of Barrett's esophagus.   ESOPHAGOGASTRODUODENOSCOPY (EGD) WITH PROPOFOL N/A 04/29/2015   SLF: 1. Barretts esophagus 2. Moderate non-erosive gastritis.    ESOPHAGOGASTRODUODENOSCOPY (EGD) WITH PROPOFOL N/A 08/01/2018   Barrett's, repeat in 5 years. Empiric dilatation, mild gastritis   ESOPHAGOGASTRODUODENOSCOPY (EGD) WITH PROPOFOL N/A 05/09/2023   Procedure: ESOPHAGOGASTRODUODENOSCOPY (EGD) WITH PROPOFOL;  Surgeon: Lanelle Bal, DO;  [facility];  Service: Endoscopy;  Laterality:  N/A;  730am, asa 4   HERNIA REPAIR     ventral hernia repair   LEFT HEART CATHETERIZATION WITH CORONARY/GRAFT ANGIOGRAM N/A 12/29/2012   Procedure: LEFT HEART CATHETERIZATION WITH Isabel Caprice;  Surgeon: Lennette Bihari, MD;  Location: [facility] CATH LAB;  Service: Cardiovascular;  Laterality: N/A;   ORIF FIBULA FRACTURE Right 06/03/2015   Procedure: OPEN REDUCTION INTERNAL FIXATION (ORIF) DISTAL FIBULA  FRACTURE;  Surgeon: Valeria Batman, MD;  Location: [facility];  Service: Orthopedics;  Laterality: Right;   PERCUTANEOUS CORONARY STENT INTERVENTION (PCI-S)  12/29/2012   Procedure: PERCUTANEOUS CORONARY STENT INTERVENTION (PCI-S);  Surgeon: Lennette Bihari, MD;  Location: [facility] CATH LAB;  Service: Cardiovascular;;   POLYPECTOMY  09/27/2017   Procedure: POLYPECTOMY;  Surgeon: West Bali, MD;  [facility];  Service: Endoscopy;;  cecal polyp, ascending polyps x6, transverse colon polyps x6, splenic flexure polyps x2, descending colon polyps x6, sigmoid  colon polyp x1, rectal polyp x1    POLYPECTOMY  10/12/2021   Procedure: POLYPECTOMY;  Surgeon: Lanelle Bal, DO;  [facility]: [facility];  Service: Endoscopy;;   SAVORY DILATION  09/06/2011   Procedure: SAVORY DILATION;  Surgeon: Arlyce Harman, MD;  Location: [facility];  Service: Endoscopy;  Laterality: N/A;  Dilated with 15mm   SAVORY DILATION N/A 08/01/2018   Procedure: SAVORY  DILATION;  Surgeon: West Bali, MD;  Location: [facility];  Service: Endoscopy;  Laterality: N/A;    Family History  Problem Relation Age of Onset   Diabetes Mother    Heart disease Mother    Diabetes Father    Heart disease Father    Pancreatic cancer Brother        age 75, doing well   Colon cancer Neg Hx    Anesthesia problems Neg Hx    Hypotension Neg Hx    Malignant hyperthermia Neg Hx    Pseudochol deficiency Neg Hx     Allergies as of 12/13/2023 - Review Complete 11/14/2023  Allergen Reaction Noted   Contrast media [iodinated  contrast media] Other (See Comments) 03/14/2013   Iohexol Other (See Comments) 01/02/2008    Social History   Socioeconomic History   Marital status: Divorced    Spouse name: Not on file   Number of children: Not on file   Years of education: Not on file   Highest education level: Not on file  Occupational History   Not on file  Tobacco Use   Smoking status: Every Day    Current packs/day: 0.50    Average packs/day: 0.5 packs/day for 30.0 years (15.0 ttl pk-yrs)    Types: Cigarettes   Smokeless tobacco: Never   Tobacco comments:    Smokes 0.25 packs per day ARJ, 12/29/22  Vaping Use   Vaping status: Never Used  Substance and Sexual Activity   Alcohol use: Yes    Comment: occ beer   Drug use: No   Sexual activity: Never  Other Topics Concern   Not on file  Social History Narrative   Not on file   Social Drivers of Health   Financial Resource Strain: Low Risk  (03/22/2023)   Received from [facility]   Overall Financial Resource Strain (CARDIA)    Difficulty of Paying Living Expenses: Not very hard  Food Insecurity: No Food Insecurity (03/22/2023)   Received from [facility], [facility]   Hunger Vital Sign    Worried About Running Out of Food in the Last Year: Never true    Ran Out of Food in the Last Year: Never true  Transportation Needs: No Transportation Needs (03/22/2023)   Received from [facility], [facility]   PRAPARE - Transportation    Lack of Transportation (Medical): No    Lack of Transportation (Non-Medical): No  Physical Activity: Not on file  Stress: Not on file  Social Connections: Unknown (03/03/2022)   Received from [facility], [facility]   Social Network    Social Network: Not on file    Review of Systems   Gen: + fatigue. Denies fever, chills, anorexia. Denies weakness, weight loss.  CV: Denies chest pain, palpitations, syncope, peripheral edema, and claudication. Resp: + DOE. Denies cough, wheezing, coughing  up blood, and pleurisy. GI: See HPI Derm: Denies rash, itching, dry skin Psych: Denies depression, anxiety, memory loss, confusion. No homicidal or suicidal ideation.  Heme: Denies bruising, bleeding, and enlarged lymph nodes.  Physical Exam   BP 104/69 (BP Location: Left Arm, Patient Position: Sitting, Cuff Size: Large)   Pulse 97   Temp 97.6 F (36.4 C) (Temporal)   Ht 5' 8.5" (1.74 m)   Wt (!) 307 lb 6.4 oz (139.4 kg)   BMI 46.06 kg/m   General:   Alert and oriented. No distress noted. Pleasant and cooperative.  Head:  Normocephalic and atraumatic. Eyes:  Conjuctiva clear without scleral icterus. Lungs: Increased work of breathing. Abdomen:  +BS, soft, non-tender and non-distended. No rebound or guarding. No HSM or masses noted.  Rectal: deferred Msk: Normal posture.  Unsteady gait, using single-point cane. Extremities:  Without edema. Neurologic:  Alert and  oriented x4 Psych:  Alert and cooperative. Normal mood and affect.  Assessment  [name] is a 63 y.o. male with a history of Barrett's esophagus, GERD, MI in 2000 06/2008, COPD, A-fib on Eliquis, HTN, HLD, OSA on CPAP, and stroke in 2017 presenting today for follow-up of GERD, constipation, and rectal bleeding.   Constipation, fecal leakage: Continues to have some constipation.  Had tried MiraLAX in the past but remained to having hard stools.  Continues to strain at times.  Has also tried fiber supplementation in the past without good improvement.  Still going about 3 times in the morning but usually is small in nature, likely incomplete emptying.  States he did well with the Linzess samples provided to him previously.  Does admit to some occasional intermittent left sided abdominal pain.  Will resubmit for Linzess 145 mcg daily, given patient's fixed income cost may be an issue.  If Linzess is not on formulary we will switch to recommended alternative by insurance.  If medication is too expensive we will submit for  patient assistance.  Rectal bleeding: Resolved. Last hgb in October 2024 was normal.  Reportedly unsure how long he used Anusol cream.  Last colonoscopy in 2022 with evidence of internal hemorrhoids.  No alarm symptoms present.  Denies any rectal pain.  Although this has resolved, we will plan on doing rectal exam at next visit given ongoing fecal leakage/soiling.  Working on getting long-term bowel regimen.  GERD, Barrett's esophagus, dysphagia: Has intermittent reflux symptoms.  Still on pantoprazole 40 mg twice daily.  EGD July 2024 with esophagitis, gastritis, and small hiatal hernia.  For now we will continue PPI twice daily.  Given ongoing swallowing difficulties with liquids and solids, will perform barium pill esophagram.  At times he states his symptoms improve with Valium, but lately sometimes this is not helpful either.  Will assess for any newly developed stenosis or stricturing versus dysmotility.  Could consider speech evaluation if BPE negative.  PLAN   Linzess 145 mcg once daily. May require patient assistance.  Pantoprazole 40 mg BID BPE Dysphagia precautions discussed.  Plan for exam at next visit, room 1.  Follow up in 3 months    Brooke Bonito, MSN, FNP-BC, AGACNP-BC [facility]

## 2023-12-12 NOTE — Telephone Encounter (Signed)
 Rx sent to pharmacy

## 2023-12-12 NOTE — Telephone Encounter (Signed)
 Fax received from [facility] requesting refills on Tessalon  perles.  Last rx'd by Roena Clark, NP on 09/01/23, #45, 1 rf.  Please advise on request for refill. Thank you!

## 2023-12-13 ENCOUNTER — Ambulatory Visit: Payer: HMO | Admitting: Gastroenterology

## 2023-12-13 ENCOUNTER — Encounter: Payer: Self-pay | Admitting: Gastroenterology

## 2023-12-13 VITALS — BP 104/69 | HR 97 | Temp 97.6°F | Ht 68.5 in | Wt 307.4 lb

## 2023-12-13 DIAGNOSIS — Z8719 Personal history of other diseases of the digestive system: Secondary | ICD-10-CM

## 2023-12-13 DIAGNOSIS — K2270 Barrett's esophagus without dysplasia: Secondary | ICD-10-CM

## 2023-12-13 DIAGNOSIS — K219 Gastro-esophageal reflux disease without esophagitis: Secondary | ICD-10-CM | POA: Diagnosis not present

## 2023-12-13 DIAGNOSIS — R1319 Other dysphagia: Secondary | ICD-10-CM

## 2023-12-13 DIAGNOSIS — K5900 Constipation, unspecified: Secondary | ICD-10-CM

## 2023-12-13 DIAGNOSIS — R151 Fecal smearing: Secondary | ICD-10-CM

## 2023-12-13 DIAGNOSIS — R1310 Dysphagia, unspecified: Secondary | ICD-10-CM

## 2023-12-13 DIAGNOSIS — R159 Full incontinence of feces: Secondary | ICD-10-CM

## 2023-12-13 MED ORDER — LINACLOTIDE 145 MCG PO CAPS
145.0000 ug | ORAL_CAPSULE | Freq: Every day | ORAL | 3 refills | Status: DC
Start: 2023-12-13 — End: 2023-12-13

## 2023-12-13 MED ORDER — LINACLOTIDE 145 MCG PO CAPS: 145.0000 ug | ORAL_CAPSULE | Freq: Every day | ORAL | 3 refills | Status: AC

## 2023-12-13 NOTE — Addendum Note (Signed)
Addended by: Aida Raider on: 12/13/2023 02:42 PM   Modules accepted: Orders

## 2023-12-13 NOTE — Patient Instructions (Addendum)
I have sent in Linzess for you to take 1 tablet daily 30 minutes prior to breakfast.  I sent this medication to [facility] in [place] for you. If this medication is too expensive, please let us know and we can fill out papers for patient assistance.  We will need a proof of income and for you to sign the paperwork.  Continue taking pantoprazole 40 mg twice daily. Remember to chew your food adequately taking your time.  Eat small bites and alternate with sips of liquid.  Please stay sitting upright for at least 30 minutes after meals and avoid eating within 2 hours of going to bed.  We will get a barium pill esophagram to further evaluate your swallowing.  Follow-up in 3 months  It was a pleasure to see you today. I want to create trusting relationships with patients. If you receive a survey regarding your visit,  I greatly appreciate you taking time to fill this out on paper or through your MyChart. I value your feedback.  Brooke Bonito, MSN, FNP-BC, AGACNP-BC [facility]

## 2023-12-15 ENCOUNTER — Telehealth: Payer: Self-pay | Admitting: Internal Medicine

## 2023-12-15 NOTE — Telephone Encounter (Signed)
LVM for patient advising there is not another rx alternative to Tessalon. He can try generic OTC Mucinex or Delsym, these are the best OTC meds for cough. He can also use GoodRx at [facility] for a cost of $23.46 for Benzonatate 200mg  #45.   All above information left on vm. Nothing further needed at this time.

## 2023-12-15 NOTE — Telephone Encounter (Signed)
Patient brought in letter from [facility] stating he has been denied for Modafinil. We do not prescribe this medication, and the only documentation in the chart notes it to be a historical med.   LVM to advise patient to contact prescribing provider.

## 2023-12-15 NOTE — Telephone Encounter (Signed)
Patient was a walk in today and brought in a medication request denial from [employer] for Modafinil 200 mg.  He is wanting to know if there is anything Dr. Sherene Sires can do about this----patient call back---[phone]

## 2023-12-15 NOTE — Addendum Note (Signed)
Addended by: Shelby Dubin on: 12/15/2023 04:33 PM   Modules accepted: Orders

## 2023-12-15 NOTE — Telephone Encounter (Signed)
Patient is now asking if Dr. Sherene Sires can call something in place of the Benzonatate--tessalon ---it is too expensive-- call in to [facility]---patient call back [phone]

## 2023-12-19 ENCOUNTER — Ambulatory Visit (HOSPITAL_COMMUNITY)
Admission: RE | Admit: 2023-12-19 | Discharge: 2023-12-19 | Disposition: A | Discharge status: Home / Self Care | Payer: HMO | Source: Ambulatory Visit | Attending: Gastroenterology | Admitting: Gastroenterology

## 2023-12-19 DIAGNOSIS — R1319 Other dysphagia: Secondary | ICD-10-CM | POA: Diagnosis not present

## 2023-12-19 DIAGNOSIS — R1310 Dysphagia, unspecified: Secondary | ICD-10-CM | POA: Diagnosis not present

## 2024-01-02 DIAGNOSIS — J449 Chronic obstructive pulmonary disease, unspecified: Secondary | ICD-10-CM | POA: Diagnosis not present

## 2024-01-03 DIAGNOSIS — E538 Deficiency of other specified B group vitamins: Secondary | ICD-10-CM | POA: Diagnosis not present

## 2024-01-04 ENCOUNTER — Telehealth: Payer: Self-pay | Admitting: Nurse Practitioner

## 2024-01-04 NOTE — Telephone Encounter (Signed)
 Pt left a VM stating that he went to his pharmacy to get his sensor and he had them check it. His sugar was 108. He is asking does he need to change anything with his regimen

## 2024-01-05 NOTE — Telephone Encounter (Signed)
 Pt states he has been without his Libre sensors but will be able to refill his Rx Saturday. States he did get a standard meter today and when he checked his BG at 1:00pm it was 237. Pt is taking Humulin R U-500 60 units TID with meals. Advised pt to continue checking his BG before breakfast, lunch, supper and bedtime. Pt voiced understanding. Please advise of any changes.

## 2024-01-06 NOTE — Telephone Encounter (Signed)
 Noted.

## 2024-01-10 ENCOUNTER — Other Ambulatory Visit: Payer: Self-pay | Admitting: Internal Medicine

## 2024-01-16 ENCOUNTER — Ambulatory Visit: Payer: HMO | Admitting: Adult Health

## 2024-01-16 ENCOUNTER — Encounter: Payer: Self-pay | Admitting: Adult Health

## 2024-01-16 VITALS — BP 99/62 | HR 80 | Ht 68.5 in | Wt 305.4 lb

## 2024-01-16 DIAGNOSIS — J449 Chronic obstructive pulmonary disease, unspecified: Secondary | ICD-10-CM

## 2024-01-16 DIAGNOSIS — J9610 Chronic respiratory failure, unspecified whether with hypoxia or hypercapnia: Secondary | ICD-10-CM

## 2024-01-16 DIAGNOSIS — R918 Other nonspecific abnormal finding of lung field: Secondary | ICD-10-CM

## 2024-01-16 DIAGNOSIS — G4733 Obstructive sleep apnea (adult) (pediatric): Secondary | ICD-10-CM

## 2024-01-16 NOTE — Patient Instructions (Addendum)
 Continue on Oxygen 4l/m during daytime  Continue 3.5 L with CPAP at bedtime Continue on Trelegy inhaler.  Rinse after use Albuterol or DuoNeb as needed Delsym 2 tsp Twice daily  for cough As needed   Tessalon Three times a day  for cough As needed   Work on not smoking   CT Chest in 6 months   Continue on CPAP At bedtime with Oxygen , wear all night long .  Work on weight loss  Do not drive if sleepy   Follow up with Dr. Sherene Sires in 3 months  and As needed   Please contact office for sooner follow up if symptoms do not improve or worsen or seek emergency care

## 2024-01-16 NOTE — Progress Notes (Signed)
 @Patient  ID: [name], male    DOB: 16-Jun-1961, 63 y.o.   MRN: [id number]  Chief Complaint  Patient presents with   Follow-up  Discussed the use of AI scribe software for clinical note transcription with the patient, who gave verbal consent to proceed.  Referring provider: Assunta Found, MD  HPI: 63 year old male active smoker followed for COPD, obstructive sleep apnea and presumed lung cancer (enlarging right upper lobe nodule status post SBRT and chronic respiratory failure on oxygen Medical history significant for coronary artery disease status post CABG, atrial fibrillation on Eliquis, diabetes on insulin and diastolic heart failure  TEST/EVENTS :  CT chest 06/2023 post XRT scarring RUL  stable scattered lung nodules    2021 PFT Moderate airflow obstruction   CPAP titration 2017 optimal control at 8 cmH2o.   01/16/2024 Follow up : COPD, O2 RF, OSA , Lung cancer  Patient returns for a 33-month follow-up.  Patient has moderate COPD.  He continues to smoke.  We discussed smoking cessation.  Last visit Trelegy was discontinued due to upper airway cough.  Changed to DuoNeb 4 times daily.  Patient says that he did not change and is still on Trelegy daily.  Uses his DuoNeb only as needed.  Patient did have his lisinopril changed and seems that cough has gotten better.  He continues to get short of breath with activities but overall says he is doing better.  He does continue smoke.  We discussed smoking cessation.  Patient remains on oxygen oxygen 4 L during the daytime and 3.5 L with CPAP at bedtime.  Patient has presumed lung cancer with enlarging right upper lobe nodule status post SBRT.  CT chest August 2024 showed radiation changes and stable scattered lung nodules.  We discussed a follow-up CT chest in 6 months for surveillance.  He has obstructive sleep apnea and uses a CPAP machine with a full face mask every night, achieving about eleven hours of usage. He reports no issues  with the CPAP, although the cost of oxygen rental is a concern. His sleep apnea is well-controlled with the current setup.        Allergies  Allergen Reactions   Contrast Media [Iodinated Contrast Media] Other (See Comments)    Pt must be premedicated before given contrast media - stops heart   Iohexol Other (See Comments)     Consult with radiologist before pre meds are given.Desc: PT. STATES HEART STOPPED HAS TO BE PREMED.     Immunization History  Administered Date(s) Administered   Influenza Inj Mdck Quad Pf 09/25/2018   Influenza Split 08/23/2014, 10/23/2015   Influenza,inj,Quad PF,6+ Mos 08/03/2016, 07/10/2019, 09/22/2020, 09/14/2021, 07/31/2022   Influenza-Unspecified 09/26/2012    Past Medical History:  Diagnosis Date   Abnormal myocardial perfusion study 01/01/2011   there a small to moderate sized inferobasal scar   Barrett's esophagus    Cataracts, bilateral    Chronic kidney disease    hx of kidney stones   Claudication (HCC) 11/16/2011   PV test perform shows normal   COPD (chronic obstructive pulmonary disease) (HCC)    Depression    Diabetes (HCC)    type 2 diabetes mellitus   Dysrhythmia    GERD (gastroesophageal reflux disease)    Glaucoma    Hernia of abdominal wall    History of radiation therapy    Right lung, SBRT- 04/16/21-04/30/21- Dr. Antony Blackbird   HTN (hypertension)    Hyperlipidemia    Morbid obesity (HCC)  Myocardial infarction (HCC) 2008,2009,2009   OSA (obstructive sleep apnea)    on cpap   PAF (paroxysmal atrial fibrillation) (HCC)    S/P colonoscopy 2009   3-4 mm transverse colon erosions likely secondary to  ASA   S/P endoscopy 10/2010   moderate erosive gastritis, Barrett's esophagus 1-2cm   Sleep apnea    SOB (shortness of breath) 11/03/2007   2D Echo EF 50%-55%   Stroke (HCC) 2017    Tobacco History: Social History   Tobacco Use  Smoking Status Every Day   Current packs/day: 0.50   Average packs/day: 0.5 packs/day  for 30.0 years (15.0 ttl pk-yrs)   Types: Cigarettes  Smokeless Tobacco Never  Tobacco Comments   Smokes 0.25 packs per day [facility], 12/29/22   Ready to quit: Not Answered Counseling given: Not Answered Tobacco comments: Smokes 0.25 packs per day [facility], 12/29/22   Outpatient Medications Prior to Visit  Medication Sig Dispense Refill   acetaminophen (TYLENOL) 500 MG tablet Take 500 mg by mouth every 6 (six) hours as needed for moderate pain.     albuterol (VENTOLIN HFA) 108 (90 Base) MCG/ACT inhaler Inhale 1-2 puffs into the lungs every 4 (four) hours as needed for wheezing or shortness of breath. 18 g 11   apixaban (ELIQUIS) 5 MG TABS tablet Take 1 tablet (5 mg total) by mouth 2 (two) times daily. 60 tablet 1   benzonatate (TESSALON) 200 MG capsule Take 1 capsule (200 mg total) by mouth 3 (three) times daily as needed. 45 capsule 0   bisoprolol (ZEBETA) 5 MG tablet Take 5 mg by mouth daily.     blood glucose meter kit and supplies Dispense based on patient and insurance preference. Use up to two times daily as directed. (FOR ICD-E11.65) 1 each 5   Blood Glucose Monitoring Suppl (ONETOUCH VERIO) w/Device KIT 1 each by Does not apply route as needed. 1 kit 0   Cholecalciferol (VITAMIN D) 50 MCG (2000 UT) tablet Take 2,000 Units by mouth daily.     Continuous Glucose Receiver (FREESTYLE LIBRE 3 READER) DEVI 1 Piece by Does not apply route once as needed for up to 1 dose. 1 each 0   Continuous Glucose Sensor (FREESTYLE LIBRE 3 PLUS SENSOR) MISC Change sensor every 15 days. 2 each 2   Cyanocobalamin (VITAMIN B-12 IJ) Inject 1,000 mcg as directed every 30 (thirty) days.     diazepam (VALIUM) 5 MG tablet Take 5 mg by mouth 2 (two) times daily.     diclofenac Sodium (VOLTAREN) 1 % GEL Apply 1 Application topically 4 (four) times daily as needed (pain).     Dulaglutide (TRULICITY) 4.5 MG/0.5ML SOAJ Inject 4.5 mg as directed once a week. 6 mL 1   DULoxetine (CYMBALTA) 30 MG capsule Take 30 mg by mouth  daily.     empagliflozin (JARDIANCE) 10 MG TABS tablet Take 1 tablet (10 mg total) by mouth daily before breakfast. 90 tablet 1   escitalopram (LEXAPRO) 20 MG tablet Take 20 mg by mouth in the morning.     fenofibrate (TRICOR) 145 MG tablet Take 145 mg by mouth in the morning.     Fluticasone-Umeclidin-Vilant (TRELEGY ELLIPTA) 100-62.5-25 MCG/ACT AEPB Inhale 1 puff into the lungs daily. 60 each 2   furosemide (LASIX) 40 MG tablet Take 40 mg by mouth daily.     gabapentin (NEURONTIN) 300 MG capsule Take 300 mg by mouth 3 (three) times daily.     hydrocortisone (ANUSOL-HC) 2.5 % rectal cream Place 1  Application rectally 2 (two) times daily. 30 g 1   Insulin Pen Needle (B-D ULTRAFINE III SHORT PEN) 31G X 8 MM MISC Use as directed three time daily to inject insulin 300 each 1   insulin regular human CONCENTRATED (HUMULIN R U-500 KWIKPEN) 500 UNIT/ML KwikPen Inject 70 Units into the skin 3 (three) times daily with meals. Only when blood glucose is above 90 and eating. 36 mL 0   ipratropium-albuterol (DUONEB) 0.5-2.5 (3) MG/3ML SOLN Take 3 mLs by nebulization every 6 (six) hours as needed. 360 mL 11   isosorbide mononitrate (IMDUR) 120 MG 24 hr tablet Take 120 mg by mouth in the morning.     Lancets (ONETOUCH DELICA PLUS LANCET33G) MISC USE TO check blood sugar FOUR TIMES DAILY AS DIRECTED 200 each 1   linaclotide (LINZESS) 145 MCG CAPS capsule Take 1 capsule (145 mcg total) by mouth daily before breakfast. 30 capsule 3   meclizine (ANTIVERT) 25 MG tablet Take 25 mg by mouth every 6 (six) hours as needed for dizziness or nausea.     metoprolol tartrate (LOPRESSOR) 100 MG tablet Take 1 tablet (100 mg total) by mouth 2 (two) times daily. OFFICE VISIT NEEDED BEFORE ADDITIONAL REFILLS 28 tablet 0   modafinil (PROVIGIL) 200 MG tablet Take 200 mg by mouth daily.     mupirocin ointment (BACTROBAN) 2 % Apply 1 Application topically 2 (two) times daily.     nitroGLYCERIN (NITROLINGUAL) 0.4 MG/SPRAY spray Place 1  spray under the tongue every 5 (five) minutes x 3 doses as needed for chest pain. 12 g 0   ONETOUCH VERIO test strip USE TO check blood sugar FOUR TIMES DAILY AS DIRECTED 200 strip 1   pantoprazole (PROTONIX) 40 MG tablet Take 1 tablet (40 mg total) by mouth 2 (two) times daily before a meal. 180 tablet 3   polyethylene glycol powder (GLYCOLAX/MIRALAX) 17 GM/SCOOP powder Take 17 g once daily in 8 ounces of water. 238 g 0   potassium chloride SA (KLOR-CON M) 20 MEQ tablet Take 1 tablet (20 mEq total) by mouth daily. While Taking Lasix/Furosemide (Patient taking differently: Take 20 mEq by mouth 2 (two) times daily.) 30 tablet 0   rosuvastatin (CRESTOR) 20 MG tablet Take 20 mg by mouth in the morning.     valsartan (DIOVAN) 80 MG tablet Take 1 tablet (80 mg total) by mouth daily. 30 tablet 11   No facility-administered medications prior to visit.     Review of Systems:   Constitutional:   No  weight loss, night sweats,  Fevers, chills, +fatigue, or  lassitude.  HEENT:   No headaches,  Difficulty swallowing,  Tooth/dental problems, or  Sore throat,                No sneezing, itching, ear ache, nasal congestion, post nasal drip,   CV:  No chest pain,  Orthopnea, PND, swelling in lower extremities, anasarca, dizziness, palpitations, syncope.   GI  No heartburn, indigestion, abdominal pain, nausea, vomiting, diarrhea, change in bowel habits, loss of appetite, bloody stools.   Resp:  No chest wall deformity  Skin: no rash or lesions.  GU: no dysuria, change in color of urine, no urgency or frequency.  No flank pain, no hematuria   MS:  No joint pain or swelling.  No decreased range of motion.  No back pain.    Physical Exam  BP 99/62   Pulse 80   Ht 5' 8.5" (1.74 m)   Wt (!) 305  lb 6.4 oz (138.5 kg)   SpO2 90% Comment: 4L pulse o2  BMI 45.76 kg/m   GEN: A/Ox3; pleasant , NAD, well nourished , On O2    HEENT:  [place]/AT,   NOSE-clear, THROAT-clear, no lesions, no postnasal drip or  exudate noted.   NECK:  Supple w/ fair ROM; no JVD; normal carotid impulses w/o bruits; no thyromegaly or nodules palpated; no lymphadenopathy.    RESP  Clear  P & A; w/o, wheezes/ rales/ or rhonchi. no accessory muscle use, no dullness to percussion  CARD:  RRR, no m/r/g, tr  peripheral edema, pulses intact, no cyanosis or clubbing.  GI:   Soft & nt; nml bowel sounds; no organomegaly or masses detected.   Musco: Warm bil, no deformities or joint swelling noted.   Neuro: alert, no focal deficits noted.    Skin: Warm, no lesions or rashes    Lab Results:      Imaging:  Administration History     None          Latest Ref Rng & Units 03/20/2020    3:38 PM  PFT Results  FVC-Pre L 2.70  P  FVC-Predicted Pre % 55  P  FVC-Post L 2.75  P  FVC-Predicted Post % 56  P  Pre FEV1/FVC % % 70  P  Post FEV1/FCV % % 74  P  FEV1-Pre L 1.90  P  FEV1-Predicted Pre % 51  P  FEV1-Post L 2.04  P  DLCO uncorrected ml/min/mmHg 16.47  P  DLCO UNC% % 58  P  DLCO corrected ml/min/mmHg 16.47  P  DLCO COR %Predicted % 58  P  DLVA Predicted % 85  P  TLC L 5.35  P  TLC % Predicted % 76  P  RV % Predicted % 112  P    P Preliminary result    No results found for: "NITRICOXIDE"      Assessment & Plan:   No problem-specific Assessment & Plan notes found for this encounter.  Assessment and Plan    Chronic Obstructive Pulmonary Disease (COPD)   COPD with moderate airflow obstruction is exacerbated by continued smoking, which poses a risk with oxygen use.  -Appears to be stable.  Encouraged on smoking cessation.  Continue on Trelegy inhaler.  Albuterol or DuoNeb as needed.     Presumed Lung Cancer   Presumed lung cancer with an enlarging right upper lobe nodule was treated with SBRT. The previous CT in August 2024 showed stable radiation changes and stable scattered lung nodules.   Order a CT chest in 6 months for serial surveillance.  Chronic Respiratory Failure   Chronic  respiratory failure is managed with supplemental oxygen at 4 L during the day and 3.5 L at night with CPAP. The cost of oxygen therapy is a concern.  Ask him to discuss with DME company regarding charges.  Continue supplemental oxygen therapy as currently prescribed.  Obstructive Sleep Apnea   Obstructive sleep apnea is well-controlled with CPAP therapy. He uses a full face mask with excellent compliance, achieving about eleven hours per night. CPAP pressures are adequate with no device issues. He uses supplemental oxygen with CPAP, which is costly. He is advised to contact [employer] regarding CPAP rental charges Continue CPAP therapy with current settings, instruct to contact [employer] regarding CPAP rental charges, and continue supplemental oxygen as currently prescribed.         Rubye Oaks, NP 01/16/2024

## 2024-01-18 ENCOUNTER — Telehealth (INDEPENDENT_AMBULATORY_CARE_PROVIDER_SITE_OTHER): Payer: Self-pay | Admitting: Otolaryngology

## 2024-01-18 NOTE — Telephone Encounter (Signed)
 Leftt vm to reschedule appointment due to Dr.Teoh being out of the office.

## 2024-01-19 ENCOUNTER — Telehealth (INDEPENDENT_AMBULATORY_CARE_PROVIDER_SITE_OTHER): Payer: Self-pay | Admitting: Otolaryngology

## 2024-01-19 NOTE — Telephone Encounter (Signed)
 Patient LVM returning our call to reschedule appt on 02/22/2024 with Dr. Suszanne Conners because he will be out of the office.  I called and LVM requesting a call back.

## 2024-01-23 ENCOUNTER — Other Ambulatory Visit: Payer: Self-pay

## 2024-01-23 MED ORDER — BENZONATATE 200 MG PO CAPS: 200.0000 mg | ORAL_CAPSULE | Freq: Three times a day (TID) | ORAL | 0 refills | Status: DC | PRN

## 2024-01-23 NOTE — Telephone Encounter (Signed)
 Please advise on refill request and if additional refills should be given. Thank you!

## 2024-01-24 ENCOUNTER — Telehealth: Payer: Self-pay

## 2024-01-24 ENCOUNTER — Other Ambulatory Visit: Payer: Self-pay

## 2024-01-24 ENCOUNTER — Telehealth: Payer: Self-pay | Admitting: Internal Medicine

## 2024-01-24 MED ORDER — BENZONATATE 200 MG PO CAPS: 200.0000 mg | ORAL_CAPSULE | Freq: Three times a day (TID) | ORAL | 0 refills | Status: DC | PRN

## 2024-01-24 NOTE — Telephone Encounter (Signed)
*  Pulm  Pharmacy Patient Advocate Encounter  Received notification from [facility] [employer]RX [employer] that Prior Authorization for Benzonatate 200MG  capsules  has been CANCELLED due to "Product not covered by plan pharmacy benefit."  For RxLocal Coupon Price of: $6.53 submit to BIN: [id number] PCN: [id number] Group: [id number]

## 2024-01-24 NOTE — Telephone Encounter (Signed)
Sent this in to [facility]

## 2024-01-24 NOTE — Telephone Encounter (Signed)
 Called and spoke with patient he stated that he would like to have this medication sent to [facility] in [place] so he can get this though [facility].

## 2024-01-24 NOTE — Telephone Encounter (Signed)
 DELSYM OR MUCINEX DM STRONGEST NON - NARCOTIC BEST YOU CAN DO IF CAN'T AFFORD TESSALON OFF INSURANCE PLAN

## 2024-01-24 NOTE — Telephone Encounter (Signed)
 Patient is returning phone call. Patient phone number is [phone].

## 2024-01-24 NOTE — Telephone Encounter (Signed)
 Tried to call pt lvm for patient to call us , pt can this medication from [facility] for $13.98 though [facility] . Lvm for pt to call us back to discuss this with the pt.

## 2024-01-24 NOTE — Telephone Encounter (Signed)
 Patient cannot afford Benzonatate . Was notified to try Museux dm or Delsym. He is wondering if there is an generic he could try. [phone]   Pharmacy: [facility]

## 2024-01-24 NOTE — Telephone Encounter (Signed)
Dr. Wert, please advise. 

## 2024-01-26 DIAGNOSIS — G4733 Obstructive sleep apnea (adult) (pediatric): Secondary | ICD-10-CM | POA: Diagnosis not present

## 2024-01-26 DIAGNOSIS — J441 Chronic obstructive pulmonary disease with (acute) exacerbation: Secondary | ICD-10-CM | POA: Diagnosis not present

## 2024-01-26 DIAGNOSIS — J449 Chronic obstructive pulmonary disease, unspecified: Secondary | ICD-10-CM | POA: Diagnosis not present

## 2024-01-30 DIAGNOSIS — J9611 Chronic respiratory failure with hypoxia: Secondary | ICD-10-CM | POA: Diagnosis not present

## 2024-01-30 DIAGNOSIS — I5040 Unspecified combined systolic (congestive) and diastolic (congestive) heart failure: Secondary | ICD-10-CM | POA: Diagnosis not present

## 2024-02-02 ENCOUNTER — Ambulatory Visit: Payer: Medicare HMO | Admitting: "Endocrinology

## 2024-02-02 ENCOUNTER — Encounter: Payer: Self-pay | Admitting: "Endocrinology

## 2024-02-02 VITALS — BP 80/58 | HR 88 | Ht 68.5 in | Wt 304.8 lb

## 2024-02-02 DIAGNOSIS — F17200 Nicotine dependence, unspecified, uncomplicated: Secondary | ICD-10-CM

## 2024-02-02 DIAGNOSIS — E782 Mixed hyperlipidemia: Secondary | ICD-10-CM | POA: Diagnosis not present

## 2024-02-02 DIAGNOSIS — E1159 Type 2 diabetes mellitus with other circulatory complications: Secondary | ICD-10-CM | POA: Diagnosis not present

## 2024-02-02 DIAGNOSIS — Z794 Long term (current) use of insulin: Secondary | ICD-10-CM

## 2024-02-02 DIAGNOSIS — I10 Essential (primary) hypertension: Secondary | ICD-10-CM | POA: Diagnosis not present

## 2024-02-02 DIAGNOSIS — J449 Chronic obstructive pulmonary disease, unspecified: Secondary | ICD-10-CM | POA: Diagnosis not present

## 2024-02-02 LAB — POCT GLYCOSYLATED HEMOGLOBIN (HGB A1C): HbA1c, POC (controlled diabetic range): 6.9 % (ref 0.0–7.0)

## 2024-02-02 MED ORDER — TRULICITY 4.5 MG/0.5ML ~~LOC~~ SOAJ: 4.5000 mg | SUBCUTANEOUS | 1 refills | Status: AC

## 2024-02-02 MED ORDER — HUMULIN R U-500 KWIKPEN 500 UNIT/ML ~~LOC~~ SOPN: 50.0000 [IU] | PEN_INJECTOR | Freq: Three times a day (TID) | SUBCUTANEOUS | 1 refills | Status: DC

## 2024-02-02 MED ORDER — EMPAGLIFLOZIN 10 MG PO TABS: 10.0000 mg | ORAL_TABLET | Freq: Every day | ORAL | 1 refills | Status: DC

## 2024-02-02 MED ORDER — VALSARTAN 40 MG PO TABS: 40.0000 mg | ORAL_TABLET | Freq: Every day | ORAL | 1 refills | Status: AC

## 2024-02-02 NOTE — Progress Notes (Signed)
 02/02/2024  Endocrinology follow-up note   Subjective:    Patient ID: [name], male    DOB: [birth year].  he is being seen in follow-up  for management of currently uncontrolled symptomatic type 2 diabetes, hypertension, hyperlipidemia, obesity. PMD:  Assunta Found, MD.   Past Medical History:  Diagnosis Date   Abnormal myocardial perfusion study 01/01/2011   there a small to moderate sized inferobasal scar   Barrett's esophagus    Cataracts, bilateral    Chronic kidney disease    hx of kidney stones   Claudication (HCC) 11/16/2011   PV test perform shows normal   COPD (chronic obstructive pulmonary disease) (HCC)    Depression    Diabetes (HCC)    type 2 diabetes mellitus   Dysrhythmia    GERD (gastroesophageal reflux disease)    Glaucoma    Hernia of abdominal wall    History of radiation therapy    Right lung, SBRT- 04/16/21-04/30/21- Dr. Antony Blackbird   HTN (hypertension)    Hyperlipidemia    Morbid obesity (HCC)    Myocardial infarction (HCC) 2008,2009,2009   OSA (obstructive sleep apnea)    on cpap   PAF (paroxysmal atrial fibrillation) (HCC)    S/P colonoscopy 2009   3-4 mm transverse colon erosions likely secondary to  ASA   S/P endoscopy 10/2010   moderate erosive gastritis, Barrett's esophagus 1-2cm   Sleep apnea    SOB (shortness of breath) 11/03/2007   2D Echo EF 50%-55%   Stroke (HCC) 2017   Past Surgical History:  Procedure Laterality Date   BIOPSY N/A 04/29/2015   Procedure: BIOPSY;  Surgeon: West Bali, MD;  Location: [facility];  Service: Endoscopy;  Laterality: N/A;   BIOPSY  08/01/2018   Procedure: BIOPSY;  Surgeon: West Bali, MD;  Location: [facility];  Service: Endoscopy;;  esophageal   BIOPSY  10/12/2021   Procedure: BIOPSY;  Surgeon: Lanelle Bal, DO;  Location: [facility];  Service: Endoscopy;;   BIOPSY  05/09/2023   Procedure: BIOPSY;  Surgeon: Lanelle Bal, DO;  Location: [facility];  Service:  Endoscopy;;   CABG X 4  03/2008   CARDIAC CATHETERIZATION  2009   stent placement to the left circumflex a 2.25    CARDIAC CATHETERIZATION  07/08/2010   CARDIAC CATHETERIZATION N/A 11/02/2019   COLONOSCOPY WITH PROPOFOL N/A 09/27/2017   normal ileum, twenty 4 to 8 mm polyps in the sigmoid colon, descending colon, splenic flexure, transverse colon, ascending colon, cecum.  An additional three 2 to 4 mm polyps in the rectum and the descending colon.  Diverticulosis, internal hemorrhoids.  Surgical pathology found the polyps to be one fragment of hyperplastic polyp and 22 fragments of tubular adenoma..  Recommended repeat colonoscop   COLONOSCOPY WITH PROPOFOL N/A 10/12/2021   Procedure: COLONOSCOPY WITH PROPOFOL;  Surgeon: Lanelle Bal, DO;  [facility]: [facility];  Service: Endoscopy;  Laterality: N/A;  1:30pm   CORONARY ARTERY BYPASS GRAFT  2008   4 vessels   CORONARY STENT PLACEMENT  12/29/12   CORONARY STENT PLACEMENT  12/2012   ESOPHAGEAL BRUSHING  05/09/2023   Procedure: ESOPHAGEAL BRUSHING;  Surgeon: Lanelle Bal, DO;  [facility]: [facility];  Service: Endoscopy;;   ESOPHAGEAL DILATION N/A 04/29/2015   Procedure: ESOPHAGEAL DILATION 15 mm, 16 mm;  Surgeon: West Bali, MD;  Location: [facility];  Service: Endoscopy;  Laterality: N/A;   ESOPHAGOGASTRODUODENOSCOPY  09/2011   Barrett's esophagus,  no dysplasia on biopsies. Distal esophagitis. Status post dilation. Moderate gastritis and duodenitis, but biopsies benign. Next EGD in November 2015 for surveillance of Barrett's esophagus.   ESOPHAGOGASTRODUODENOSCOPY (EGD) WITH PROPOFOL N/A 04/29/2015   SLF: 1. Barretts esophagus 2. Moderate non-erosive gastritis.    ESOPHAGOGASTRODUODENOSCOPY (EGD) WITH PROPOFOL N/A 08/01/2018   Barrett's, repeat in 5 years. Empiric dilatation, mild gastritis   ESOPHAGOGASTRODUODENOSCOPY (EGD) WITH PROPOFOL N/A 05/09/2023   Procedure: ESOPHAGOGASTRODUODENOSCOPY (EGD) WITH PROPOFOL;  Surgeon: Lanelle Bal, DO;  [facility];  Service: Endoscopy;  Laterality: N/A;  730am, asa 4   HERNIA REPAIR     ventral hernia repair   LEFT HEART CATHETERIZATION WITH CORONARY/GRAFT ANGIOGRAM N/A 12/29/2012   Procedure: LEFT HEART CATHETERIZATION WITH Isabel Caprice;  Surgeon: Lennette Bihari, MD;  Location: [facility] C[facility] LAB;  Service: Cardiovascular;  Laterality: N/A;   ORIF FIBULA FRACTURE Right 06/03/2015   Procedure: OPEN REDUCTION INTERNAL FIXATION (ORIF) DISTAL FIBULA  FRACTURE;  Surgeon: Valeria Batman, MD;  Location: [facility];  Service: Orthopedics;  Laterality: Right;   PERCUTANEOUS CORONARY STENT INTERVENTION (PCI-S)  12/29/2012   Procedure: PERCUTANEOUS CORONARY STENT INTERVENTION (PCI-S);  Surgeon: Lennette Bihari, MD;  Location: [name] CATH LAB;  Service: Cardiovascular;;   POLYPECTOMY  09/27/2017   Procedure: POLYPECTOMY;  Surgeon: West Bali, MD;  [facility];  Service: Endoscopy;;  cecal polyp, ascending polyps x6, transverse colon polyps x6, splenic flexure polyps x2, descending colon polyps x6, sigmoid  colon polyp x1, rectal polyp x1    POLYPECTOMY  10/12/2021   Procedure: POLYPECTOMY;  Surgeon: Lanelle Bal, DO;  [facility];  Service: Endoscopy;;   SAVORY DILATION  09/06/2011   Procedure: SAVORY DILATION;  Surgeon: Arlyce Harman, MD;  Location: [facility];  Service: Endoscopy;  Laterality: N/A;  Dilated with 15mm   SAVORY DILATION N/A 08/01/2018   Procedure: SAVORY DILATION;  Surgeon: West Bali, MD;  [facility]: [facility];  Service: Endoscopy;  Laterality: N/A;   Social History   Socioeconomic History   Marital status: Divorced    Spouse name: Not on file   Number of children: Not on file   Years of education: Not on file   Highest education level: Not on file  Occupational History   Not on file  Tobacco Use   Smoking status: Every Day    Current packs/day: 0.50    Average packs/day: 0.5 packs/day for 30.0 years (15.0 ttl pk-yrs)     Types: Cigarettes   Smokeless tobacco: Never   Tobacco comments:    Smokes 0.25 packs per day [facility]J, 12/29/22  Vaping Use   Vaping status: Never Used  Substance and Sexual Activity   Alcohol use: Yes    Comment: occ beer   Drug use: No   Sexual activity: Never  Other Topics Concern   Not on file  Social History Narrative   Not on file   Social Drivers of Health   Financial Resource Strain: Low Risk  (03/22/2023)   Received from [facility]   Overall Financial Resource Strain (CARDIA)    Difficulty of Paying Living Expenses: Not very hard  Food Insecurity: No Food Insecurity (03/22/2023)   Received from [facility], [facility]   Hunger Vital Sign    Worried About Running Out of Food in the Last Year: Never true    Ran Out of Food in the Last Year: Never true  Transportation Needs: No Transportation Needs (  03/22/2023)   Received from [facility], [facility]   PRAPARE - Transportation    Lack of Transportation (Medical): No    Lack of Transportation (Non-Medical): No  Physical Activity: Not on file  Stress: Not on file  Social Connections: Unknown (03/03/2022)   Received from [facility], [facility]   Social Network    Social Network: Not on file   Outpatient Encounter Medications as of 02/02/2024  Medication Sig   acetaminophen (TYLENOL) 500 MG tablet Take 500 mg by mouth every 6 (six) hours as needed for moderate pain.   albuterol (VENTOLIN HFA) 108 (90 Base) MCG/ACT inhaler Inhale 1-2 puffs into the lungs every 4 (four) hours as needed for wheezing or shortness of breath.   apixaban (ELIQUIS) 5 MG TABS tablet Take 1 tablet (5 mg total) by mouth 2 (two) times daily.   benzonatate (TESSALON) 200 MG capsule Take 1 capsule (200 mg total) by mouth 3 (three) times daily as needed. (Patient not taking: Reported on 02/02/2024)   bisoprolol (ZEBETA) 5 MG tablet Take 5 mg by mouth daily.   blood glucose meter kit and supplies Dispense based on patient and  insurance preference. Use up to two times daily as directed. (FOR ICD-E11.65)   Blood Glucose Monitoring Suppl (ONETOUCH VERIO) w/Device KIT 1 each by Does not apply route as needed.   Cholecalciferol (VITAMIN D) 50 MCG (2000 UT) tablet Take 2,000 Units by mouth daily.   Continuous Glucose Receiver (FREESTYLE LIBRE 3 READER) DEVI 1 Piece by Does not apply route once as needed for up to 1 dose.   Continuous Glucose Sensor (FREESTYLE LIBRE 3 PLUS SENSOR) MISC Change sensor every 15 days.   Cyanocobalamin (VITAMIN B-12 IJ) Inject 1,000 mcg as directed every 30 (thirty) days.   diazepam (VALIUM) 5 MG tablet Take 5 mg by mouth 2 (two) times daily.   diclofenac Sodium (VOLTAREN) 1 % GEL Apply 1 Application topically 4 (four) times daily as needed (pain).   Dulaglutide (TRULICITY) 4.5 MG/0.5ML SOAJ Inject 4.5 mg as directed once a week.   DULoxetine (CYMBALTA) 30 MG capsule Take 30 mg by mouth daily.   empagliflozin (JARDIANCE) 10 MG TABS tablet Take 1 tablet (10 mg total) by mouth daily before breakfast.   escitalopram (LEXAPRO) 20 MG tablet Take 20 mg by mouth in the morning.   fenofibrate (TRICOR) 145 MG tablet Take 145 mg by mouth in the morning.   Fluticasone-Umeclidin-Vilant (TRELEGY ELLIPTA) 100-62.5-25 MCG/ACT AEPB Inhale 1 puff into the lungs daily.   furosemide (LASIX) 40 MG tablet Take 40 mg by mouth daily.   gabapentin (NEURONTIN) 300 MG capsule Take 300 mg by mouth 3 (three) times daily.   hydrocortisone (ANUSOL-HC) 2.5 % rectal cream Place 1 Application rectally 2 (two) times daily.   Insulin Pen Needle (B-D ULTRAFINE III SHORT PEN) 31G X 8 MM MISC Use as directed three time daily to inject insulin   insulin regular human CONCENTRATED (HUMULIN R U-500 KWIKPEN) 500 UNIT/ML KwikPen Inject 50 Units into the skin 3 (three) times daily with meals. Only when blood glucose is above 90 and eating.   ipratropium-albuterol (DUONEB) 0.5-2.5 (3) MG/3ML SOLN Take 3 mLs by nebulization every 6 (six)  hours as needed.   isosorbide mononitrate (IMDUR) 120 MG 24 hr tablet Take 120 mg by mouth in the morning.   Lancets (ONETOUCH DELICA PLUS LANCET33G) MISC USE TO check blood sugar FOUR TIMES DAILY AS DIRECTED   linaclotide (LINZESS) 145 MCG CAPS capsule Take 1 capsule (  145 mcg total) by mouth daily before breakfast.   meclizine (ANTIVERT) 25 MG tablet Take 25 mg by mouth every 6 (six) hours as needed for dizziness or nausea.   metoprolol tartrate (LOPRESSOR) 100 MG tablet Take 1 tablet (100 mg total) by mouth 2 (two) times daily. OFFICE VISIT NEEDED BEFORE ADDITIONAL REFILLS   modafinil (PROVIGIL) 200 MG tablet Take 200 mg by mouth daily.   mupirocin ointment (BACTROBAN) 2 % Apply 1 Application topically 2 (two) times daily.   nitroGLYCERIN (NITROLINGUAL) 0.4 MG/SPRAY spray Place 1 spray under the tongue every 5 (five) minutes x 3 doses as needed for chest pain.   ONETOUCH VERIO test strip USE TO check blood sugar FOUR TIMES DAILY AS DIRECTED   pantoprazole (PROTONIX) 40 MG tablet Take 1 tablet (40 mg total) by mouth 2 (two) times daily before a meal.   polyethylene glycol powder (GLYCOLAX/MIRALAX) 17 GM/SCOOP powder Take 17 g once daily in 8 ounces of water.   potassium chloride SA (KLOR-CON M) 20 MEQ tablet Take 1 tablet (20 mEq total) by mouth daily. While Taking Lasix/Furosemide (Patient taking differently: Take 20 mEq by mouth 2 (two) times daily.)   rosuvastatin (CRESTOR) 20 MG tablet Take 20 mg by mouth in the morning.   valsartan (DIOVAN) 80 MG tablet Take 1 tablet (80 mg total) by mouth daily.   [DISCONTINUED] Dulaglutide (TRULICITY) 4.5 MG/0.5ML SOAJ Inject 4.5 mg as directed once a week.   [DISCONTINUED] empagliflozin (JARDIANCE) 10 MG TABS tablet Take 1 tablet (10 mg total) by mouth daily before breakfast.   [DISCONTINUED] insulin regular human CONCENTRATED (HUMULIN R U-500 KWIKPEN) 500 UNIT/ML KwikPen Inject 70 Units into the skin 3 (three) times daily with meals. Only when blood  glucose is above 90 and eating. (Patient taking differently: Inject 60 Units into the skin 3 (three) times daily with meals. Only when blood glucose is above 90 and eating.)   No facility-administered encounter medications on file as of 02/02/2024.    ALLERGIES: Allergies  Allergen Reactions   Contrast Media [Iodinated Contrast Media] Other (See Comments)    Pt must be premedicated before given contrast media - stops heart   Iohexol Other (See Comments)     Consult with radiologist before pre meds are given.Desc: PT. STATES HEART STOPPED HAS TO BE PREMED.     VACCINATION STATUS: Immunization History  Administered Date(s) Administered   Influenza Inj Mdck Quad Pf 09/25/2018   Influenza Split 08/23/2014, 10/23/2015   Influenza,inj,Quad PF,6+ Mos 08/03/2016, 07/10/2019, 09/22/2020, 09/14/2021, 07/31/2022   Influenza-Unspecified 09/26/2012    Diabetes He presents for his follow-up diabetic visit. He has type 2 diabetes mellitus. Onset time: He was diagnosed at approximate age of 42 years. His disease course has been improving. There are no hypoglycemic associated symptoms. Pertinent negatives for hypoglycemia include no confusion, headaches, pallor or seizures. Pertinent negatives for diabetes include no blurred vision, no chest pain, no fatigue, no polydipsia, no polyphagia, no polyuria and no weakness. There are no hypoglycemic complications. Symptoms are improving. Diabetic complications include a CVA and heart disease. Risk factors for coronary artery disease include diabetes mellitus, dyslipidemia, hypertension, male sex, obesity, tobacco exposure, sedentary lifestyle and family history. He is compliant with treatment some of the time. His weight is fluctuating minimally. He is following a generally unhealthy diet. When asked about meal planning, he reported none. He has not had a previous visit with a dietitian. He never participates in exercise. His home blood glucose trend is decreasing  steadily. His  breakfast blood glucose range is generally 140-180 mg/dl. His lunch blood glucose range is generally 140-180 mg/dl. His dinner blood glucose range is generally 140-180 mg/dl. His bedtime blood glucose range is generally 140-180 mg/dl. His overall blood glucose range is 140-180 mg/dl. Frankie presents with his CGM which shows improvement in his glycemic profile.  His AGP report shows 72% time in range, 26% Level One hyperglycemia, 2% level 2 hypoglycemia.  His average blood glucose is 157 for the most recent 14 days.  His point-of-care A1c is 6.9% improving from 9.2% during his last visit.      ) An ACE inhibitor/angiotensin II receptor blocker is being taken. He does not see a podiatrist.Eye exam is not current.  Hyperlipidemia This is a chronic problem. The current episode started more than 1 year ago. The problem is uncontrolled. Recent lipid tests were reviewed and are high. Exacerbating diseases include diabetes and obesity. Pertinent negatives include no chest pain, myalgias or shortness of breath. Current antihyperlipidemic treatment includes statins. Compliance problems include medication cost and psychosocial issues.  Risk factors for coronary artery disease include dyslipidemia, diabetes mellitus, hypertension, male sex, obesity, family history and a sedentary lifestyle.  Hypertension This is a chronic problem. The current episode started more than 1 year ago. The problem is uncontrolled. Pertinent negatives include no blurred vision, chest pain, headaches, neck pain, palpitations or shortness of breath. Risk factors for coronary artery disease include diabetes mellitus, dyslipidemia, male gender, obesity, family history, sedentary lifestyle and smoking/tobacco exposure. Past treatments include ACE inhibitors. Hypertensive end-organ damage includes CVA.    Review of systems  Constitutional: + Minimally fluctuating body weight,  current  Body mass index is 45.67 kg/m. , no  fatigue, no subjective hyperthermia, no subjective hypothermia   Objective:    BP (!) 80/58   Pulse 88   Ht 5' 8.5" (1.74 m)   Wt (!) 304 lb 12.8 oz (138.3 kg)   BMI 45.67 kg/m   Wt Readings from Last 3 Encounters:  02/02/24 (!) 304 lb 12.8 oz (138.3 kg)  01/16/24 (!) 305 lb 6.4 oz (138.5 kg)  12/13/23 (!) 307 lb 6.4 oz (139.4 kg)      Physical Exam- Limited  Constitutional:  Body mass index is 45.67 kg/m. , not in acute distress, normal state of mind    CMP ( most recent) CMP     Component Value Date/Time   NA 139 08/29/2023 2124   NA 134 05/10/2023 1404   K 3.6 08/29/2023 2124   CL 101 08/29/2023 2124   CO2 31 08/29/2023 2124   GLUCOSE 95 08/29/2023 2124   BUN 10 08/29/2023 2124   BUN 19 05/10/2023 1404   CREATININE 1.25 (H) 08/29/2023 2124   CALCIUM 9.1 08/29/2023 2124   PROT 6.9 05/10/2023 1404   ALBUMIN 4.3 05/10/2023 1404   AST 22 05/10/2023 1404   ALT 20 05/10/2023 1404   ALKPHOS 85 05/10/2023 1404   BILITOT 0.5 05/10/2023 1404   GFRNONAA >60 08/29/2023 2124   GFRAA 93 12/04/2020 0000   Diabetic Labs (most recent): Lab Results  Component Value Date   HGBA1C 6.9 02/02/2024   HGBA1C 9.2 (A) 10/04/2023   HGBA1C 9.2 (A) 05/17/2023   MICROALBUR 10 06/26/2020     Lipid Panel ( most recent) Lipid Panel     Component Value Date/Time   CHOL 134 05/10/2023 1404   TRIG 487 (H) 05/10/2023 1404   HDL 24 (L) 05/10/2023 1404   CHOLHDL 5.6 (H) 05/10/2023 1404  CHOLHDL 6.1 04/28/2014 0121   VLDL 61 (H) 04/28/2014 0121   LDLCALC 40 05/10/2023 1404     Assessment & Plan:   1. DM type 2 causing vascular disease (HCC)  - Patient has currently uncontrolled symptomatic type 2 DM since  63 years of age.  Ehsan presents with his CGM which shows improvement in his glycemic profile.  His AGP report shows 72% time in range, 26% Level One hyperglycemia, 2% level 2 hypoglycemia.  His average blood glucose is 157 for the most recent 14 days.  His point-of-care  A1c is 6.9% improving from 9.2% during his last visit.    -his diabetes is complicated by coronary artery disease, CVA, obesity/sedentary life, chronic heavy smoking and ALESANDRO STUEVE remains at extremely high risk  for more acute and chronic complications which include CAD, CVA, CKD, retinopathy, and neuropathy. These are all discussed in detail with the patient.  - I have counseled him on diet management and weight loss, by adopting a carbohydrate restricted/protein rich diet.  Dorn is really struggling to connect the dots in his self-care.  Admittedly, he does not follow the routine meal and incretin times recommended.  However he wishes to stay on the same regimen of insulin and Trulicity.  - he acknowledges that there is a room for improvement in his food and drink choices. - Suggestion is made for him to avoid simple carbohydrates  from his diet including Cakes, Sweet Desserts, Ice Cream, Soda (diet and regular), Sweet Tea, Candies, Chips, Cookies, Store Bought Juices, Alcohol in Excess of  1-2 drinks a day, Artificial Sweeteners,  Coffee Creamer, and "Sugar-free" Products, Lemonade. This will help patient to have more stable blood glucose profile and potentially avoid unintended weight gain.  - I encouraged him to switch to  unprocessed or minimally processed complex starch and increased protein intake (animal or plant source), fruits, and vegetables. Whole food, plant-based diet was discussed in detail with him.  - he is advised to stick to a routine mealtimes to eat 3 meals  a day and avoid unnecessary snacks ( to snack only to correct hypoglycemia).   - I have approached him with the following individualized plan to manage diabetes and patient agrees:   -He has social problems, not enough support.  Reportedly sleeps extended hours, could be missing some meals.  He is at risk of hypoglycemia.   In the interest of safety, his  U500 insulin will be lowered to 50 units  3 times a  day for Premeal blood glucose readings above 90 mg per DL,  associated with strict monitoring of blood glucose 4 times a day-before meals and at bedtime, using his CGM continuously. -He is advised to use his meter as a backup when he has problem with his CGM.  - He does not tolerate metformin due to GI side effects.  -He is tolerating Trulicity, advised to continue Trulicity 4.5 mg subcutaneously weekly.   Side effects and precautions discussed with him.  He remains a smoker, and with significant hypertriglyceridemia, at risk for pancreatitis. -Patient is encouraged to call clinic for blood glucose levels less than 70 or above 200 mg /dl. -He is tolerating and benefiting from low-dose SGLT2 inhibitors.  I advised him to continue Jardiance 10 mg p.o. daily at breakfast .  Side effects and precautions specifically urinary tract infection and genital mycotic infections was discussed with him.   - Patient specific target  A1c;  LDL, HDL, Triglycerides,  were discussed in detail.  2) BP/HTN:  -His blood pressure is marginal at 80/58-patient asymptomatic.  His valsartan will be lowered to 40 mg p.o. daily, also has metoprolol 100 mg p.o. twice daily.   The patient was counseled on the dangers of tobacco use, and was advised to quit.  Reviewed strategies to maximize success, including removing cigarettes and smoking materials from environment.   3) Lipids/HPL: He has had history of severe hypertriglyceridemia, recently improving to 230 from 469, mainly associated with improvement in his glycemic profile.  His LDL is on target at 40.  He is advised to continue  Trilipix 135 mg p.o. nightly, continue Crestor 20 mg p.o. nightly.  He is also on fenofibrate 140 mg p.o. nightly.       4)  Weight/Diet: His BMI is 45.67--clearly complicating his diabetes care.  He is a candidate for modest weight loss.  CDE Consult has been  initiated , exercise, and detailed carbohydrates information provided.  5) vitamin D  deficiency: He is on ongoing supplement with vitamin D2 50,000 units weekly for 12 weeks.  6) Chronic Care/Health Maintenance:  -he  is on ACEI/ARB and Statin medications and  is encouraged to continue to follow up with Ophthalmology, Dentist,  Podiatrist at least yearly or according to recommendations, and advised to  quit smoking. I have recommended yearly flu vaccine and pneumonia vaccination at least every 5 years; moderate intensity exercise for up to 150 minutes weekly; and  sleep for at least 7 hours a day.   - I advised patient to maintain close follow up with Assunta Found, MD for primary care needs.    I spent  26  minutes in the care of the patient today including review of labs from CMP, Lipids, Thyroid Function, Hematology (current and previous including abstractions from other facilities); face-to-face time discussing  his blood glucose readings/logs, discussing hypoglycemia and hyperglycemia episodes and symptoms, medications doses, his options of short and long term treatment based on the latest standards of care / guidelines;  discussion about incorporating lifestyle medicine;  and documenting the encounter. Risk reduction counseling performed per USPSTF guidelines to reduce  obesity and cardiovascular risk factors.     Please refer to Patient Instructions for Blood Glucose Monitoring and Insulin/Medications Dosing Guide"  in media tab for additional information. Please  also refer to " Patient Self Inventory" in the Media  tab for reviewed elements of pertinent patient history.  Christie Beckers participated in the discussions, expressed understanding, and voiced agreement with the above plans.  All questions were answered to his satisfaction. he is encouraged to contact clinic should he have any questions or concerns prior to his return visit.    Follow up plan: - Return in about 4 months (around 06/03/2024) for F/U with Pre-visit Labs, Meter/CGM/Logs, A1c here.  Marquis Lunch,  MD Phone: [phone]  Fax: [phone]   02/02/2024, 5:50 PM   This note was partially dictated with voice recognition software. Similar sounding words can be transcribed inadequately or may not  be corrected upon review.

## 2024-02-02 NOTE — Patient Instructions (Signed)

## 2024-02-08 ENCOUNTER — Encounter: Payer: Self-pay | Admitting: Gastroenterology

## 2024-02-14 ENCOUNTER — Telehealth: Payer: Self-pay

## 2024-02-14 NOTE — Telephone Encounter (Signed)
 Tried to return a call to pt but had to leave a message requesting pt return call to the office.

## 2024-02-19 ENCOUNTER — Other Ambulatory Visit: Payer: Self-pay | Admitting: "Endocrinology

## 2024-02-22 ENCOUNTER — Telehealth: Payer: Self-pay | Admitting: Internal Medicine

## 2024-02-22 ENCOUNTER — Telehealth: Payer: Self-pay | Admitting: "Endocrinology

## 2024-02-22 ENCOUNTER — Other Ambulatory Visit: Payer: Self-pay | Admitting: Internal Medicine

## 2024-02-22 ENCOUNTER — Telehealth: Payer: Self-pay

## 2024-02-22 ENCOUNTER — Ambulatory Visit (INDEPENDENT_AMBULATORY_CARE_PROVIDER_SITE_OTHER): Payer: Medicare HMO

## 2024-02-22 NOTE — Telephone Encounter (Signed)
 He has received Trulicity  from [facility] in the past, his insurance has changed and a Rx was sent to the pharmacy.

## 2024-02-22 NOTE — Telephone Encounter (Signed)
 Pt states that Trulicity  comes from patient assistance, but was sent into [facility].  He is out.  Has not had any since last appt.  Pt states sugars are ranging from 250-360.   Mostly in the 300s.

## 2024-02-22 NOTE — Telephone Encounter (Signed)
 Spoke with pt, requested he bring his CGM reader to the office to be uploaded for evaluation. Will also fill out PAP forms for Trulicity .

## 2024-02-22 NOTE — Telephone Encounter (Signed)
 Received notification from pt Rx Trulicity  needs a prior authorization.

## 2024-02-22 NOTE — Telephone Encounter (Signed)
 Referenced TE from 01/24/24---  There is not a prescription alternative to benzonatate . Patient is unsure if he picked up his script from [facility] using the good rx card.  Informed him he can use mucinex  dm or deslym per Dr. Waymond Hailey. Patient understood

## 2024-02-22 NOTE — Telephone Encounter (Signed)
 Sorry. Please cancel the prior auth request.

## 2024-02-22 NOTE — Telephone Encounter (Signed)
 Patient was walk in today stating his insurance does not cover benzonatate  200 mg  is there something else Dr. Waymond Hailey can call in for him---[facility] --patient call back [phone]

## 2024-03-03 DIAGNOSIS — J449 Chronic obstructive pulmonary disease, unspecified: Secondary | ICD-10-CM | POA: Diagnosis not present

## 2024-03-08 ENCOUNTER — Telehealth: Payer: Self-pay

## 2024-03-08 NOTE — Telephone Encounter (Signed)
 Per chart we do not prescribe this medication for patient. Per pharmacy this rx comes from his PCP.  Spoke with patient and advised he contact his PCP for assistance. Patient voiced understanding. Nothing further needed at this time.

## 2024-03-08 NOTE — Telephone Encounter (Signed)
 Copied from CRM [id number]. Topic: Clinical - Medication Question >> Mar 08, 2024  3:25 PM Margarette Shawl wrote: Reason for CRM:   Pt is contacting clinic regarding his modafinil  (PROVIGIL ) 200 MG tablet. He is requesting if provider could prescribe medication so that his insurance will cover the medication.   Requests call back, if able to order can it please be sent to [facility] on file.   CB#  [phone]

## 2024-03-21 DIAGNOSIS — E538 Deficiency of other specified B group vitamins: Secondary | ICD-10-CM | POA: Diagnosis not present

## 2024-03-29 ENCOUNTER — Encounter (INDEPENDENT_AMBULATORY_CARE_PROVIDER_SITE_OTHER): Payer: Self-pay | Admitting: Otolaryngology

## 2024-03-29 ENCOUNTER — Ambulatory Visit (INDEPENDENT_AMBULATORY_CARE_PROVIDER_SITE_OTHER): Admitting: Otolaryngology

## 2024-03-29 VITALS — BP 105/66

## 2024-03-29 DIAGNOSIS — K2101 Gastro-esophageal reflux disease with esophagitis, with bleeding: Secondary | ICD-10-CM

## 2024-03-29 DIAGNOSIS — R070 Pain in throat: Secondary | ICD-10-CM | POA: Diagnosis not present

## 2024-03-29 NOTE — Progress Notes (Unsigned)
 Patient ID: [name], male   DOB: 10-15-1961, 63 y.o.   MRN: [id number]  Follow up: LPR, anxiety, choking sensation   HPI: The patient is a 63 year old male who returns today for his follow-up evaluation. The patient has a history of gastroesophageal reflux and laryngopharyngeal reflux. His anxiety and frequent choking sensation were treated with b.i.d. valium  (5mg ) regimen. At his last visit, he was noted to have mild posterior laryngeal edema. He was continued on pantoprazole  daily regimen. The patient returns today reporting occasional symptoms.  He still has occasional globus sensation and choking.  He has no other complaint today.   Exam: General: Communicates without difficulty, well nourished, no acute distress. Head: Normocephalic, no evidence injury, no tenderness, facial buttresses intact without stepoff. Face/sinus: No tenderness to palpation and percussion. Facial movement is normal and symmetric. Eyes: PERRL, EOMI. No scleral icterus, conjunctivae clear. Neuro: CN II exam reveals vision grossly intact.  No nystagmus at any point of gaze. Ears: Auricles well formed without lesions.  Ear canals are intact without mass or lesion.  No erythema or edema is appreciated.  The TMs are intact without fluid. Nose: External evaluation reveals normal support and skin without lesions.  Dorsum is intact.  Anterior rhinoscopy reveals congested mucosa over anterior aspect of inferior turbinates and intact septum.  No purulence noted. Oral:  Oral cavity and oropharynx are intact, symmetric, without erythema or edema.  Mucosa is moist without lesions.  Indirect laryngoscopy reveals mild posterior laryngeal edema.  Neck: Full range of motion without pain.  There is no significant lymphadenopathy.  No masses palpable.  Thyroid  bed within normal limits to palpation.  Parotid glands and submandibular glands equal bilaterally without mass.  Trachea is midline. Neuro:  CN 2-12 grossly intact.      Assessment: 1.  Subjectively stable laryngoscopy pharyngeal reflux.  2. His anxiety and choking sensation are under control with Valium . No other abnormality is noted today.   Plan: 1. The physical exam findings are reviewed with the patient.  2. Continue valium  and pantoprazole .  3.  The patient is strongly encouraged to see a psychiatrist regarding his anxiety disorder.  He will talk to his primary care physician for a psychiatry referral. 4.  The patient will return for re-evaluation in 6 months, sooner if needed.

## 2024-03-30 DIAGNOSIS — R070 Pain in throat: Secondary | ICD-10-CM | POA: Insufficient documentation

## 2024-03-31 DIAGNOSIS — I480 Paroxysmal atrial fibrillation: Secondary | ICD-10-CM | POA: Diagnosis not present

## 2024-03-31 DIAGNOSIS — J9611 Chronic respiratory failure with hypoxia: Secondary | ICD-10-CM | POA: Diagnosis not present

## 2024-04-03 DIAGNOSIS — J9611 Chronic respiratory failure with hypoxia: Secondary | ICD-10-CM | POA: Diagnosis not present

## 2024-04-03 DIAGNOSIS — J449 Chronic obstructive pulmonary disease, unspecified: Secondary | ICD-10-CM | POA: Diagnosis not present

## 2024-04-03 DIAGNOSIS — E538 Deficiency of other specified B group vitamins: Secondary | ICD-10-CM | POA: Diagnosis not present

## 2024-04-03 DIAGNOSIS — Z6841 Body Mass Index (BMI) 40.0 and over, adult: Secondary | ICD-10-CM | POA: Diagnosis not present

## 2024-04-03 DIAGNOSIS — F411 Generalized anxiety disorder: Secondary | ICD-10-CM | POA: Diagnosis not present

## 2024-04-15 NOTE — Progress Notes (Signed)
 [name] Server, male    DOB: 1961-07-25    MRN: [id number]   Brief patient profile:  67  yowm  active smoker/former Jude pt  referred to pulmonary clinic in [place]  10/03/2023 by Dr Marvine  for f/u chronic R Chest    PFT's  03/20/20  FEV1 2.4 (54 % ) ratio 0.74  p 7 % improvement from saba p nothing  prior to study  and FV curve min concavity  and ERV 25% at wt 303      History of Present Illness  10/03/2023  Pulmonary/ 1st office eval/ [name] / [place] Office  Chief Complaint  Patient presents with   Shortness of Breath   COPD  Dyspnea:  rides scooter at [employer] / 75 ft  Cough: not much  Sleep: on cpap / 02 and flat bed / 3 pillows  SABA use: 3 x daily hfa x 2 puffs each / neb 2 twice sometimes at rest  02: does not know settings  Has chronic/ recurrent ant R cp x years, sev times per month lasting sev minutes s ex or pleuritic features or other aggravating or alleviating qualities.  Rec Make sure you check your oxygen  saturation  AT  your highest level of activity (not after you stop)   to be sure it stays over 90%  Stop lisinopril  and start valsartan  80 mg daily  Please schedule a follow up office visit in 6 weeks, call sooner if needed with all medications /inhalers/ oxygen  solutions in hand   11/14/2023  f/u ov/[name] office/[name] re: MO with PRISM   maint on trelegy   did not  bring meds / still smoking  Chief Complaint  Patient presents with   Shortness of Breath  Dyspnea:  no change = 75 ft  Cough: harsh/ slt rattle upper airway pattern  Sleeping: flat bed 3 pillows  plugs in 02 to  cpap at 3.5  SABA use: 2 x daily plus prn duoneb  02: 3lpm at rest but came here today s 02  Rec Stop trelegy  Change duoneb can be taken up to 4 x daily - especially before you go out (lasts about 4 hours )  Instead of metaprolol I strongly  prefer you change to  Bisoprolol adjusted to desirable blood pressure - show this instruction to your heart doctor at [facility]  We will  refer you to [employer] for a better portable system for you and in meantime: Make sure you check your oxygen  saturation  AT  your highest level of activity (not after you stop)   to be sure it stays over 90%  Please schedule a follow up visit in 3 months but call sooner if needed with NP for cpap f/u and wear your portable 02 to office   04/17/2024  f/u ov/[name] office/[name] re: MO with Prism   maint on trelegy  did bring meds /portable 02  Chief Complaint  Patient presents with   Follow-up    Shob - cough -white mucus    Dyspnea:  no change = 75 ft flat and slow  Cough: just white mucus Sleeping: ok on CAPAP   s resp cc  SABA use: 2 puffs hs / neb each am  02: 4lpm POC   vs Conc set on 3.5 lpm / at hs and daytime at hoem   Lung cancer screening: in program    No obvious day to day or daytime variability or assoc  purulent sputum or mucus plugs or hemoptysis  or cp or chest tightness, subjective wheeze or overt   hb symptoms.    Also denies any obvious fluctuation of symptoms with weather or environmental changes or other aggravating or alleviating factors except as outlined above   No unusual exposure hx or h/o childhood pna/ asthma or knowledge of premature birth.  Current Allergies, Complete Past Medical History, Past Surgical History, Family History, and Social History were reviewed in [employer] record.  ROS  The following are not active complaints unless bolded Hoarseness, sore throat, dysphagia, dental problems, itching, sneezing,  nasal congestion or discharge of excess mucus or purulent secretions, ear ache,   fever, chills, sweats, unintended wt loss or wt gain, classically pleuritic or exertional cp,  orthopnea pnd or arm/hand swelling  or leg swelling, presyncope, palpitations, abdominal pain, anorexia, nausea, vomiting, diarrhea  or change in bowel habits or change in bladder habits, change in stools or change in urine, dysuria, hematuria,  rash,  arthralgias, visual complaints, headache, numbness, weakness or ataxia or problems with walking or coordination,  change in mood or  memory.        Current Meds  Medication Sig   acetaminophen  (TYLENOL ) 500 MG tablet Take 500 mg by mouth every 6 (six) hours as needed for moderate pain.   albuterol  (VENTOLIN  HFA) 108 (90 Base) MCG/ACT inhaler Inhale 1-2 puffs into the lungs every 4 (four) hours as needed for wheezing or shortness of breath.   apixaban  (ELIQUIS ) 5 MG TABS tablet Take 1 tablet (5 mg total) by mouth 2 (two) times daily.   benzonatate  (TESSALON ) 200 MG capsule Take 1 capsule by mouth three times daily as needed   bisoprolol (ZEBETA) 5 MG tablet Take 5 mg by mouth daily.   blood glucose meter kit and supplies Dispense based on patient and insurance preference. Use up to two times daily as directed. (FOR ICD-E11.65)   Blood Glucose Monitoring Suppl (ONETOUCH VERIO) w/Device KIT 1 each by Does not apply route as needed.   Cholecalciferol (VITAMIN D ) 50 MCG (2000 UT) tablet Take 2,000 Units by mouth daily.   clonazePAM (KLONOPIN) 1 MG tablet Take 1 mg by mouth 2 (two) times daily as needed.   Continuous Glucose Receiver (FREESTYLE LIBRE 3 READER) DEVI 1 Piece by Does not apply route once as needed for up to 1 dose.   Continuous Glucose Sensor (FREESTYLE LIBRE 3 PLUS SENSOR) MISC Change sensor every 15 days.   Cyanocobalamin (VITAMIN B-12 IJ) Inject 1,000 mcg as directed every 30 (thirty) days.   diazepam  (VALIUM ) 5 MG tablet Take 5 mg by mouth 2 (two) times daily.   diclofenac Sodium (VOLTAREN) 1 % GEL Apply 1 Application topically 4 (four) times daily as needed (pain).   Dulaglutide  (TRULICITY ) 4.5 MG/0.5ML SOAJ Inject 4.5 mg as directed once a week.   DULoxetine (CYMBALTA) 30 MG capsule Take 30 mg by mouth daily.   empagliflozin  (JARDIANCE ) 10 MG TABS tablet Take 1 tablet (10 mg total) by mouth daily before breakfast.   escitalopram  (LEXAPRO ) 20 MG tablet Take 20 mg by mouth in the  morning.   fenofibrate  (TRICOR ) 145 MG tablet Take 145 mg by mouth in the morning.   Fluticasone -Umeclidin-Vilant (TRELEGY ELLIPTA ) 100-62.5-25 MCG/ACT AEPB Inhale 1 puff into the lungs daily.   furosemide  (LASIX ) 40 MG tablet Take 40 mg by mouth daily.   gabapentin  (NEURONTIN ) 300 MG capsule Take 300 mg by mouth 3 (three) times daily.   hydrocortisone  (ANUSOL -HC) 2.5 % rectal cream Place 1 Application  rectally 2 (two) times daily.   Insulin  Pen Needle (B-D ULTRAFINE III SHORT PEN) 31G X 8 MM MISC Use as directed three time daily to inject insulin    insulin  regular human CONCENTRATED (HUMULIN  R U-500 KWIKPEN) 500 UNIT/ML KwikPen Inject 50 Units into the skin 3 (three) times daily with meals. Only when blood glucose is above 90 and eating.   ipratropium-albuterol  (DUONEB) 0.5-2.5 (3) MG/3ML SOLN Take 3 mLs by nebulization every 6 (six) hours as needed.   isosorbide  mononitrate (IMDUR ) 120 MG 24 hr tablet Take 120 mg by mouth in the morning.   Lancets (ONETOUCH DELICA PLUS LANCET33G) MISC USE TO check blood sugar FOUR TIMES DAILY AS DIRECTED   linaclotide  (LINZESS ) 145 MCG CAPS capsule Take 1 capsule (145 mcg total) by mouth daily before breakfast.   meclizine  (ANTIVERT ) 25 MG tablet Take 25 mg by mouth every 6 (six) hours as needed for dizziness or nausea.   metoprolol  tartrate (LOPRESSOR ) 100 MG tablet Take 1 tablet (100 mg total) by mouth 2 (two) times daily. OFFICE VISIT NEEDED BEFORE ADDITIONAL REFILLS   modafinil  (PROVIGIL ) 200 MG tablet Take 200 mg by mouth daily.   mupirocin ointment (BACTROBAN) 2 % Apply 1 Application topically 2 (two) times daily.   nitroGLYCERIN  (NITROLINGUAL ) 0.4 MG/SPRAY spray Place 1 spray under the tongue every 5 (five) minutes x 3 doses as needed for chest pain.   ONETOUCH VERIO test strip USE TO check blood sugar FOUR TIMES DAILY AS DIRECTED   pantoprazole  (PROTONIX ) 40 MG tablet Take 1 tablet (40 mg total) by mouth 2 (two) times daily before a meal.   polyethylene  glycol powder (GLYCOLAX /MIRALAX ) 17 GM/SCOOP powder Take 17 g once daily in 8 ounces of water .   potassium chloride  SA (KLOR-CON  M) 20 MEQ tablet Take 1 tablet (20 mEq total) by mouth daily. While Taking Lasix /Furosemide  (Patient taking differently: Take 20 mEq by mouth 2 (two) times daily.)   rosuvastatin  (CRESTOR ) 20 MG tablet Take 20 mg by mouth in the morning.   valsartan  (DIOVAN ) 40 MG tablet Take 1 tablet (40 mg total) by mouth daily.            Past Medical History:  Diagnosis Date   Abnormal myocardial perfusion study 01/01/2011   there a small to moderate sized inferobasal scar   Barrett's esophagus    Cataracts, bilateral    Chronic kidney disease    hx of kidney stones   Claudication (HCC) 11/16/2011   PV test perform shows normal   COPD (chronic obstructive pulmonary disease) (HCC)    Depression    Diabetes (HCC)    type 2 diabetes mellitus   Dysrhythmia    GERD (gastroesophageal reflux disease)    Glaucoma    Hernia of abdominal wall    History of radiation therapy    Right lung, SBRT- 04/16/21-04/30/21- Dr. Lynwood Nasuti   HTN (hypertension)    Hyperlipidemia    Morbid obesity (HCC)    Myocardial infarction (HCC) 2008,2009,2009   OSA (obstructive sleep apnea)    on cpap   PAF (paroxysmal atrial fibrillation) (HCC)    S/P colonoscopy 2009   3-4 mm transverse colon erosions likely secondary to  ASA   S/P endoscopy 10/2010   moderate erosive gastritis, Barrett's esophagus 1-2cm   Sleep apnea    SOB (shortness of breath) 11/03/2007   2D Echo EF 50%-55%   Stroke (HCC) 2017      Objective:    Wts  04/17/2024  295   11/14/23 (!) 308 lb (139.7 kg)  10/31/23 (!) 311 lb 11.2 oz (141.4 kg)  10/04/23 (!) 303 lb 9.6 oz (137.7 kg)     Vital signs reviewed  04/17/2024  - Note at rest 02 sats  92% on  4 POC   General appearance:    somber MO (by BMI) wm nad    HEENT : Oropharynx  clear      Nasal turbinates nl    NECK :  without  apparent JVD/  palpable Nodes/TM    LUNGS: no acc muscle use,  Nl contour chest which is clear to A and P bilaterally without cough on insp or exp maneuvers   CV:  RRR  no s3 or murmur or increase in P2, and no edema   ABD:  soft and nontender   MS:  Gait nl   ext warm without deformities Or obvious joint restrictions  calf tenderness, cyanosis or clubbing    SKIN: warm and dry without lesions    NEURO:  alert, approp, nl sensorium with  no motor or cerebellar deficits apparent.           Assessment

## 2024-04-17 ENCOUNTER — Encounter: Payer: Self-pay | Admitting: Internal Medicine

## 2024-04-17 ENCOUNTER — Ambulatory Visit: Admitting: Internal Medicine

## 2024-04-17 VITALS — BP 96/64 | HR 99 | Ht 68.0 in | Wt 295.0 lb

## 2024-04-17 DIAGNOSIS — R0609 Other forms of dyspnea: Secondary | ICD-10-CM | POA: Diagnosis not present

## 2024-04-17 DIAGNOSIS — F17210 Nicotine dependence, cigarettes, uncomplicated: Secondary | ICD-10-CM | POA: Diagnosis not present

## 2024-04-17 DIAGNOSIS — F17200 Nicotine dependence, unspecified, uncomplicated: Secondary | ICD-10-CM

## 2024-04-17 DIAGNOSIS — Z6841 Body Mass Index (BMI) 40.0 and over, adult: Secondary | ICD-10-CM | POA: Diagnosis not present

## 2024-04-17 NOTE — Assessment & Plan Note (Signed)
 4-5 min discussion re active cigarette smoking in addition to office E&M  Ask about tobacco use:   ongoing  Advise quitting   I took an extended  opportunity with this patient to outline the consequences of continued cigarette use  in airway disorders based on all the data we have from the multiple national lung health studies (perfomed over decades at millions of dollars in cost)  indicating that smoking cessation, not choice of inhalers or pulmonary physicians, is the most important aspect of his care. Assess willingness:  Not committed at this point Assist in quit attempt:  Per PCP when ready Arrange follow up:   Follow up per Primary Care planned     Low-dose CT lung cancer screening is recommended for patients who are 13-6 years of age with a 20+ pack-year history of smoking and who are currently smoking or quit <=15 years ago. No coughing up blood  No unintentional weight loss of > 15 pounds in the last 6 months - pt is eligible for scanning yearly until 15 y p quits > referred to [employer]

## 2024-04-17 NOTE — Patient Instructions (Signed)
 My office will be contacting you by phone for referral to lung cancer screening   [phone]) - if you don't hear back from my office within one week,  please call us  back or notify us  thru MyChart and we'll address it right away.     Only use your albuterol  as a rescue medication to be used if you can't catch your breath by resting or doing a relaxed purse lip breathing pattern.  - The less you use it, the better it will work when you need it. - Ok to use up to 2 puffs  every 4 hours if you must but call for immediate appointment if use goes up over your usual need - Don't leave home without it !!  (think of it like the spare tire for your car)   Also  Ok to try albuterol  15 min before an activity (on alternating days)  that you know would usually make you short of breath and see if it makes any difference and if makes none then don't take albuterol  after activity unless you can't catch your breath as this means it's the resting that helps, not the albuterol .      Follow up with me in 6 m, call sooner if needed

## 2024-04-18 ENCOUNTER — Other Ambulatory Visit: Payer: Self-pay | Admitting: Internal Medicine

## 2024-04-19 NOTE — Assessment & Plan Note (Addendum)
 Active smoker  - PFT's  03/20/20  FEV1 2.4 (54 % ) ratio 0.74  p 7 % improvement from saba p nothing  prior to study with  and FV curve min concavity  and ERV 25% at wt 303    - 10/03/2023 d/c acei due to prominent pseudowheeze > no change 11/14/2023 so d/c trelegy and rx duoneb up to qid / continue ppi bid ac - 04/17/2024  After extensive coaching inhaler device,  effectiveness =    75% hfa but over using saba a baseline    Group D (now reclassified as E) in terms of symptom/risk and laba/lama/ICS  therefore appropriate rx at this point >>>  trelegy plus more approp   Re SABA :  I spent extra time with pt today reviewing appropriate use of albuterol  for prn use on exertion with the following points: 1) saba is for relief of sob that does not improve by walking a slower pace or resting but rather if the pt does not improve after trying this first. 2) If the pt is convinced, as many are, that saba helps recover from activity faster then it's easy to tell if this is the case by re-challenging : ie stop, take the inhaler, then p 5 minutes try the exact same activity (intensity of workload) that just caused the symptoms and see if they are substantially diminished or not after saba 3) if there is an activity that reproducibly causes the symptoms, try the saba 15 min before the activity on alternate days   If in fact the saba really does help, then fine to continue to use it prn but advised may need to look closer at the maintenance regimen (trelegy 100)  being used to achieve better control of airways disease with exertion.

## 2024-04-19 NOTE — Assessment & Plan Note (Signed)
 PFT's 03/20/20 ERV 25% at wt 303   Body mass index is 44.85 kg/m.  -  trending down slightly but a long way to go  Lab Results  Component Value Date   TSH 1.400 04/26/2022      Contributing to doe and risk of GERD/ dvt/ PE  >>>   reviewed the need and the process to achieve and maintain neg calorie balance > defer f/u primary care including intermittently monitoring thyroid  status            Each maintenance medication was reviewed in detail including emphasizing most importantly the difference between maintenance and prns and under what circumstances the prns are to be triggered using an action plan format where appropriate.  Total time for H and P, chart review, counseling, reviewing hfa/dpi  device(s) and generating customized AVS unique to this office visit / same day charting = 21 min

## 2024-04-20 ENCOUNTER — Other Ambulatory Visit: Payer: Self-pay | Admitting: Internal Medicine

## 2024-04-23 DIAGNOSIS — G4733 Obstructive sleep apnea (adult) (pediatric): Secondary | ICD-10-CM | POA: Diagnosis not present

## 2024-04-23 DIAGNOSIS — J441 Chronic obstructive pulmonary disease with (acute) exacerbation: Secondary | ICD-10-CM | POA: Diagnosis not present

## 2024-04-23 DIAGNOSIS — J449 Chronic obstructive pulmonary disease, unspecified: Secondary | ICD-10-CM | POA: Diagnosis not present

## 2024-04-30 ENCOUNTER — Telehealth: Payer: Self-pay

## 2024-04-30 NOTE — Telephone Encounter (Signed)
 Left a message requesting pt return call to the office.

## 2024-05-03 ENCOUNTER — Telehealth: Payer: Self-pay | Admitting: *Deleted

## 2024-05-03 DIAGNOSIS — J449 Chronic obstructive pulmonary disease, unspecified: Secondary | ICD-10-CM | POA: Diagnosis not present

## 2024-05-03 NOTE — Telephone Encounter (Signed)
 Copied from CRM [id number]. Topic: General - Other >> Apr 26, 2024 11:21 AM Rilla NOVAK wrote: Reason for CRM: Patient called and stated Dr Darlean wants him to see T Parrett the next time. Patient has CT 9/17 and unsure if he should see Tammy before or after. Patient cannot remember when Dr Darlean told him to see her. Please call patient.  I called and spoke with patient and advised him he would need to follow up in the office 1-2 weeks after the CT scan.  He stated he had a f/u scheduled on 8/25, I looked at his appointments and he does already have a follow up scheduled.  I reviewed his 2 upcoming appointments with him.  He verbalized understanding.  Nothing further needed.

## 2024-05-03 NOTE — Telephone Encounter (Signed)
 Copied from CRM [id number]. Topic: General - Other >> May 03, 2024  3:06 PM [name] wrote: Reason for CRM: Patient has CT scan on Sept 17 and he's sees Dr Darlean on Sept 25th. Patient was wondering if he should see Tammy Parrett instead.  Please call patient.  duplicate

## 2024-05-10 ENCOUNTER — Telehealth: Payer: Self-pay | Admitting: Internal Medicine

## 2024-05-10 ENCOUNTER — Other Ambulatory Visit: Payer: Self-pay | Admitting: Internal Medicine

## 2024-05-10 NOTE — Telephone Encounter (Signed)
 Copied from CRM [id number]. Topic: Clinical - Medication Refill >> May 10, 2024  1:30 PM [name] wrote: Medication:  Disp Refills Start End  benzonatate  (TESSALON ) 200 MG capsule   Yes, they advised him to call his dr This is the patient's preferred pharmacy:   [facility] [place], [place] - [id number] [place] [address] Phone: [phone] Fax: [phone]   Is this the correct pharmacy for this prescription? Yes If no, delete pharmacy and type the correct one.   Has the prescription been filled recently? Yes  Is the patient out of the medication? Yes  Has the patient been seen for an appointment in the last year OR does the patient have an upcoming appointment? Yes  Can we respond through MyChart? No  Agent: Please be advised that Rx refills may take up to 3 business days. We ask that you follow-up with your pharmacy.

## 2024-05-10 NOTE — Telephone Encounter (Signed)
 Copied from CRM [id number]. Topic: Clinical - Order For Equipment >> May 10, 2024  2:10 PM [name] wrote: Reason for CRM: Patient is calling to request a new DME company for oxygen  concentrators and supplies, as [employer] is charging him $100+ monthly and patient simply cannot afford it. Please reach out to patient with DME options.

## 2024-05-10 NOTE — Telephone Encounter (Signed)
 Spoke with pt regarding the issue with DME and he told me that [employer] sent him a letter about patient assistance and I told him if this doesn't help to let us  know and we can see how we can move forward.

## 2024-05-17 DIAGNOSIS — F419 Anxiety disorder, unspecified: Secondary | ICD-10-CM | POA: Diagnosis not present

## 2024-05-17 DIAGNOSIS — I10 Essential (primary) hypertension: Secondary | ICD-10-CM | POA: Diagnosis not present

## 2024-05-17 DIAGNOSIS — G4733 Obstructive sleep apnea (adult) (pediatric): Secondary | ICD-10-CM | POA: Diagnosis not present

## 2024-05-17 DIAGNOSIS — E119 Type 2 diabetes mellitus without complications: Secondary | ICD-10-CM | POA: Diagnosis not present

## 2024-05-17 DIAGNOSIS — Z7689 Persons encountering health services in other specified circumstances: Secondary | ICD-10-CM | POA: Diagnosis not present

## 2024-05-17 DIAGNOSIS — J449 Chronic obstructive pulmonary disease, unspecified: Secondary | ICD-10-CM | POA: Diagnosis not present

## 2024-05-17 DIAGNOSIS — E782 Mixed hyperlipidemia: Secondary | ICD-10-CM | POA: Diagnosis not present

## 2024-05-17 DIAGNOSIS — Z9981 Dependence on supplemental oxygen: Secondary | ICD-10-CM | POA: Diagnosis not present

## 2024-05-17 DIAGNOSIS — F32A Depression, unspecified: Secondary | ICD-10-CM | POA: Diagnosis not present

## 2024-05-17 DIAGNOSIS — Z720 Tobacco use: Secondary | ICD-10-CM | POA: Diagnosis not present

## 2024-05-17 DIAGNOSIS — F17210 Nicotine dependence, cigarettes, uncomplicated: Secondary | ICD-10-CM | POA: Diagnosis not present

## 2024-05-17 DIAGNOSIS — E1142 Type 2 diabetes mellitus with diabetic polyneuropathy: Secondary | ICD-10-CM | POA: Diagnosis not present

## 2024-05-22 DIAGNOSIS — R0602 Shortness of breath: Secondary | ICD-10-CM | POA: Diagnosis not present

## 2024-05-22 DIAGNOSIS — I25810 Atherosclerosis of coronary artery bypass graft(s) without angina pectoris: Secondary | ICD-10-CM | POA: Diagnosis not present

## 2024-05-22 DIAGNOSIS — Z951 Presence of aortocoronary bypass graft: Secondary | ICD-10-CM | POA: Diagnosis not present

## 2024-05-25 DIAGNOSIS — Z125 Encounter for screening for malignant neoplasm of prostate: Secondary | ICD-10-CM | POA: Diagnosis not present

## 2024-05-25 DIAGNOSIS — E119 Type 2 diabetes mellitus without complications: Secondary | ICD-10-CM | POA: Diagnosis not present

## 2024-05-25 DIAGNOSIS — D519 Vitamin B12 deficiency anemia, unspecified: Secondary | ICD-10-CM | POA: Diagnosis not present

## 2024-05-25 DIAGNOSIS — I10 Essential (primary) hypertension: Secondary | ICD-10-CM | POA: Diagnosis not present

## 2024-05-25 DIAGNOSIS — Z6841 Body Mass Index (BMI) 40.0 and over, adult: Secondary | ICD-10-CM | POA: Diagnosis not present

## 2024-05-25 DIAGNOSIS — E782 Mixed hyperlipidemia: Secondary | ICD-10-CM | POA: Diagnosis not present

## 2024-05-26 LAB — LAB REPORT - SCANNED
A1c: 6.9
Albumin, Urine POC: 4.3
Creatinine, POC: 115.5 mg/dL
EGFR: 67
Free T4: 1.32 ng/dL
Microalb Creat Ratio: 4
PSA, Total: 0.7
TSH: 1.67 (ref 0.41–5.90)

## 2024-05-29 DIAGNOSIS — R0602 Shortness of breath: Secondary | ICD-10-CM | POA: Diagnosis not present

## 2024-05-31 DIAGNOSIS — G4733 Obstructive sleep apnea (adult) (pediatric): Secondary | ICD-10-CM | POA: Diagnosis not present

## 2024-05-31 DIAGNOSIS — I10 Essential (primary) hypertension: Secondary | ICD-10-CM | POA: Diagnosis not present

## 2024-05-31 DIAGNOSIS — D751 Secondary polycythemia: Secondary | ICD-10-CM | POA: Diagnosis not present

## 2024-05-31 DIAGNOSIS — Z9981 Dependence on supplemental oxygen: Secondary | ICD-10-CM | POA: Diagnosis not present

## 2024-05-31 DIAGNOSIS — Z9989 Dependence on other enabling machines and devices: Secondary | ICD-10-CM | POA: Diagnosis not present

## 2024-05-31 DIAGNOSIS — E1165 Type 2 diabetes mellitus with hyperglycemia: Secondary | ICD-10-CM | POA: Diagnosis not present

## 2024-05-31 DIAGNOSIS — J9611 Chronic respiratory failure with hypoxia: Secondary | ICD-10-CM | POA: Diagnosis not present

## 2024-05-31 DIAGNOSIS — Z720 Tobacco use: Secondary | ICD-10-CM | POA: Diagnosis not present

## 2024-05-31 DIAGNOSIS — Z6841 Body Mass Index (BMI) 40.0 and over, adult: Secondary | ICD-10-CM | POA: Diagnosis not present

## 2024-05-31 DIAGNOSIS — I119 Hypertensive heart disease without heart failure: Secondary | ICD-10-CM | POA: Diagnosis not present

## 2024-05-31 DIAGNOSIS — E782 Mixed hyperlipidemia: Secondary | ICD-10-CM | POA: Diagnosis not present

## 2024-05-31 DIAGNOSIS — J449 Chronic obstructive pulmonary disease, unspecified: Secondary | ICD-10-CM | POA: Diagnosis not present

## 2024-05-31 DIAGNOSIS — E559 Vitamin D deficiency, unspecified: Secondary | ICD-10-CM | POA: Diagnosis not present

## 2024-05-31 DIAGNOSIS — F411 Generalized anxiety disorder: Secondary | ICD-10-CM | POA: Diagnosis not present

## 2024-05-31 DIAGNOSIS — E119 Type 2 diabetes mellitus without complications: Secondary | ICD-10-CM | POA: Diagnosis not present

## 2024-06-03 DIAGNOSIS — J449 Chronic obstructive pulmonary disease, unspecified: Secondary | ICD-10-CM | POA: Diagnosis not present

## 2024-06-04 ENCOUNTER — Encounter (HOSPITAL_COMMUNITY): Payer: Self-pay | Admitting: *Deleted

## 2024-06-04 ENCOUNTER — Other Ambulatory Visit: Payer: Self-pay

## 2024-06-04 ENCOUNTER — Emergency Department (HOSPITAL_COMMUNITY)
Admission: EM | Admit: 2024-06-04 | Discharge: 2024-06-04 | Disposition: A | Discharge status: Home / Self Care | Attending: Emergency Medicine | Admitting: Emergency Medicine

## 2024-06-04 ENCOUNTER — Emergency Department (HOSPITAL_COMMUNITY)

## 2024-06-04 DIAGNOSIS — Z87891 Personal history of nicotine dependence: Secondary | ICD-10-CM | POA: Insufficient documentation

## 2024-06-04 DIAGNOSIS — K4020 Bilateral inguinal hernia, without obstruction or gangrene, not specified as recurrent: Secondary | ICD-10-CM | POA: Diagnosis not present

## 2024-06-04 DIAGNOSIS — Z7901 Long term (current) use of anticoagulants: Secondary | ICD-10-CM | POA: Insufficient documentation

## 2024-06-04 DIAGNOSIS — X58XXXA Exposure to other specified factors, initial encounter: Secondary | ICD-10-CM | POA: Diagnosis not present

## 2024-06-04 DIAGNOSIS — I700 Atherosclerosis of aorta: Secondary | ICD-10-CM | POA: Diagnosis not present

## 2024-06-04 DIAGNOSIS — R109 Unspecified abdominal pain: Secondary | ICD-10-CM | POA: Diagnosis not present

## 2024-06-04 DIAGNOSIS — S20309A Unspecified superficial injuries of unspecified front wall of thorax, initial encounter: Secondary | ICD-10-CM | POA: Diagnosis present

## 2024-06-04 DIAGNOSIS — S2232XA Fracture of one rib, left side, initial encounter for closed fracture: Secondary | ICD-10-CM | POA: Diagnosis not present

## 2024-06-04 DIAGNOSIS — J449 Chronic obstructive pulmonary disease, unspecified: Secondary | ICD-10-CM | POA: Diagnosis not present

## 2024-06-04 DIAGNOSIS — I4891 Unspecified atrial fibrillation: Secondary | ICD-10-CM | POA: Diagnosis not present

## 2024-06-04 DIAGNOSIS — Z79899 Other long term (current) drug therapy: Secondary | ICD-10-CM | POA: Diagnosis not present

## 2024-06-04 DIAGNOSIS — K439 Ventral hernia without obstruction or gangrene: Secondary | ICD-10-CM | POA: Diagnosis not present

## 2024-06-04 LAB — URINALYSIS, ROUTINE W REFLEX MICROSCOPIC
Bacteria, UA: NONE SEEN
Bilirubin Urine: NEGATIVE
Glucose, UA: >=500 mg/dL — AB
Hgb urine dipstick: NEGATIVE
Ketones, ur: NEGATIVE mg/dL
Leukocytes,Ua: NEGATIVE
Nitrite: NEGATIVE
Protein, ur: NEGATIVE mg/dL
Specific Gravity, Urine: 1.02 (ref 1.005–1.030)
pH: 5 (ref 5.0–8.0)

## 2024-06-04 LAB — CBC WITH DIFFERENTIAL/PLATELET
Abs Immature Granulocytes: 0.05 K/uL (ref 0.00–0.07)
Basophils Absolute: 0.2 K/uL — ABNORMAL HIGH (ref 0.0–0.1)
Basophils Relative: 1 %
Eosinophils Absolute: 0.6 K/uL — ABNORMAL HIGH (ref 0.0–0.5)
Eosinophils Relative: 6 %
HCT: 54.1 % — ABNORMAL HIGH (ref 39.0–52.0)
Hemoglobin: 18.1 g/dL — ABNORMAL HIGH (ref 13.0–17.0)
Immature Granulocytes: 1 %
Lymphocytes Relative: 27 %
Lymphs Abs: 3 K/uL (ref 0.7–4.0)
MCH: 31.9 pg (ref 26.0–34.0)
MCHC: 33.5 g/dL (ref 30.0–36.0)
MCV: 95.4 fL (ref 80.0–100.0)
Monocytes Absolute: 0.7 K/uL (ref 0.1–1.0)
Monocytes Relative: 7 %
Neutro Abs: 6.5 K/uL (ref 1.7–7.7)
Neutrophils Relative %: 58 %
Platelets: 299 K/uL (ref 150–400)
RBC: 5.67 MIL/uL (ref 4.22–5.81)
RDW: 12.7 % (ref 11.5–15.5)
WBC: 11.1 K/uL — ABNORMAL HIGH (ref 4.0–10.5)
nRBC: 0 % (ref 0.0–0.2)

## 2024-06-04 LAB — BRAIN NATRIURETIC PEPTIDE: B Natriuretic Peptide: 36 pg/mL (ref 0.0–100.0)

## 2024-06-04 MED ORDER — OXYCODONE HCL 5 MG PO TABS: 5.0000 mg | ORAL_TABLET | ORAL | 0 refills | Status: AC | PRN

## 2024-06-04 MED ORDER — LIDOCAINE 5 % EX PTCH: 1.0000 | MEDICATED_PATCH | CUTANEOUS | 0 refills | Status: AC

## 2024-06-04 MED ORDER — LIDOCAINE 5 % EX PTCH
1.0000 | MEDICATED_PATCH | CUTANEOUS | Status: DC
  Administered 2024-06-04: 1 via TRANSDERMAL
  Filled 2024-06-04: qty 1

## 2024-06-04 MED ORDER — OXYCODONE-ACETAMINOPHEN 5-325 MG PO TABS
1.0000 | ORAL_TABLET | Freq: Once | ORAL | Status: AC
  Administered 2024-06-04: 1 via ORAL
  Filled 2024-06-04: qty 1

## 2024-06-04 NOTE — Discharge Instructions (Signed)
 You were seen for your rib pain in the emergency department.  You are found to have a broken 11th rib on the left side.  At home, please take Tylenol  and use lidocaine  patches we have prescribed you for your pain.  Please also use the incentive spirometer we have given you to prevent any pneumonias. You may also take the oxycodone  we have prescribed you for any breakthrough pain that may have.  Do not take this before driving or operating heavy machinery.  Do not take this medication with alcohol or at the same time as your Klonopin.  Use the incentive parameter we have given you every 2 hours.  Follow-up with your primary doctor in 2-3 days regarding your visit.    Return immediately to the emergency department if you experience any of the following: Difficulty breathing, fever, severe pain, or any other concerning symptoms.    Thank you for visiting our Emergency Department. It was a pleasure taking care of you today.

## 2024-06-04 NOTE — ED Notes (Signed)
 Pt on home O2 but states battery dead on his portable oxygen .  [place] at 2 L/M applied.

## 2024-06-04 NOTE — ED Triage Notes (Signed)
 Pt with left flank pain since Saturday night. Pt with hx of kidney stones. Denies blood in urine. Denies any N/V

## 2024-06-04 NOTE — ED Provider Triage Note (Signed)
 Emergency Medicine Provider Triage Evaluation Note  [name] , a 63 y.o. male  was evaluated in triage.  Pt complains of Flank pain for the last couple of days. No urinary symptoms. No history of stones. No nausea, vomiting or fever. No injury. .  Review of Systems  Positive: Left flank pain Negative: Vomiting, fever, hematuria  Physical Exam  BP 92/68 (BP Location: Left Arm)   Pulse 88   Temp 98.2 F (36.8 C) (Oral)   Resp 20   Ht 5' 8 (1.727 m)   Wt 134.7 kg   SpO2 93%   BMI 45.16 kg/m  Gen:   Awake, no distress   Resp:  Normal effort  MSK:   Moves extremities without difficulty  Other:  Left flank is tender to palpation  Medical Decision Making  Medically screening exam initiated at 3:15 PM.  Appropriate orders placed.  CONWAY FEDORA was informed that the remainder of the evaluation will be completed by another provider, this initial triage assessment does not replace that evaluation, and the importance of remaining in the ED until their evaluation is complete.  UA with culture Bmet CBC Percocet   Odell Balls, PA-C 06/04/24 1516

## 2024-06-04 NOTE — ED Provider Notes (Signed)
 [place] EMERGENCY DEPARTMENT AT [facility] Provider Note   CSN: [id number] Arrival date & time: 06/04/24  1426     Patient presents with: Flank Pain   [name] is a 63 y.o. male.   63 year old male with history of kidney stones, atrial fibrillation on Eliquis , COPD on 2 L home oxygen , and tobacco use who presents to the emergency department with left-sided flank pain.  Patient reports that on Saturday night started experiencing left-sided point pain.  Has been constant since then.  Worse with movement including movement of his leg.  Does not radiate.  Is mild when sitting still but severe when moving.  No fevers or chills.  No nausea or vomiting.  No dysuria or frequency.  No bowel or bladder incontinence or weakness or numbness of his legs.  Does not recall any injuries.  No history of indwelling lines or IV drug use.  No history of back surgery.       Prior to Admission medications   Medication Sig Start Date End Date Taking? Authorizing Provider  lidocaine  (LIDODERM ) 5 % Place 1 patch onto the skin daily. Remove & Discard patch within 12 hours or as directed by MD 06/04/24  Yes Yolande Lamar BROCKS, MD  oxyCODONE  (ROXICODONE ) 5 MG immediate release tablet Take 1 tablet (5 mg total) by mouth every 4 (four) hours as needed for severe pain (pain score 7-10). 06/04/24  Yes Yolande Lamar BROCKS, MD  acetaminophen  (TYLENOL ) 500 MG tablet Take 500 mg by mouth every 6 (six) hours as needed for moderate pain.    [provider]  albuterol  (VENTOLIN  HFA) 108 (90 Base) MCG/ACT inhaler Inhale 1-2 puffs into the lungs every 4 (four) hours as needed for wheezing or shortness of breath. 11/14/23   Darlean Ozell NOVAK, MD  apixaban  (ELIQUIS ) 5 MG TABS tablet Take 1 tablet (5 mg total) by mouth 2 (two) times daily. 10/14/22   Pearlean Manus, MD  benzonatate  (TESSALON ) 200 MG capsule Take 1 capsule (200 mg total) by mouth 3 (three) times daily as needed. 05/10/24   Darlean Ozell NOVAK, MD   bisoprolol (ZEBETA) 5 MG tablet Take 5 mg by mouth daily. 11/23/23   [provider]  blood glucose meter kit and supplies Dispense based on patient and insurance preference. Use up to two times daily as directed. (FOR ICD-E11.65) 08/06/19   Nida, Gebreselassie W, MD  Blood Glucose Monitoring Suppl (ONETOUCH VERIO) w/Device KIT 1 each by Does not apply route as needed. 06/11/21   Nida, Gebreselassie W, MD  Cholecalciferol (VITAMIN D ) 50 MCG (2000 UT) tablet Take 2,000 Units by mouth daily.    [provider]  clonazePAM (KLONOPIN) 1 MG tablet Take 1 mg by mouth 2 (two) times daily as needed. 04/04/24   [provider]  Continuous Glucose Receiver (FREESTYLE LIBRE 3 READER) DEVI 1 Piece by Does not apply route once as needed for up to 1 dose. 10/04/23   Nida, Gebreselassie W, MD  Continuous Glucose Sensor (FREESTYLE LIBRE 3 PLUS SENSOR) MISC Change sensor every 15 days. 02/20/24   Nida, Gebreselassie W, MD  Cyanocobalamin (VITAMIN B-12 IJ) Inject 1,000 mcg as directed every 30 (thirty) days.    [provider]  diazepam  (VALIUM ) 5 MG tablet Take 5 mg by mouth 2 (two) times daily.    [provider]  diclofenac Sodium (VOLTAREN) 1 % GEL Apply 1 Application topically 4 (four) times daily as needed (pain).    [provider]  Dulaglutide  (TRULICITY ) 4.5 MG/0.5ML SOAJ Inject 4.5 mg as directed once a week. 02/02/24   Nida, Gebreselassie W, MD  DULoxetine (CYMBALTA) 30 MG capsule Take 30 mg by mouth daily. 09/06/23   [provider]  empagliflozin  (JARDIANCE ) 10 MG TABS tablet Take 1 tablet (10 mg total) by mouth daily before breakfast. 02/02/24   Nida, Gebreselassie W, MD  escitalopram  (LEXAPRO ) 20 MG tablet Take 20 mg by mouth in the morning. 04/27/14   [provider]  fenofibrate  (TRICOR ) 145 MG tablet Take 145 mg by mouth in the morning.    [provider]  furosemide  (LASIX ) 40 MG tablet Take 40 mg by mouth daily. 08/18/23    [provider]  gabapentin  (NEURONTIN ) 300 MG capsule Take 300 mg by mouth 3 (three) times daily. 08/11/22   [provider]  hydrocortisone  (ANUSOL -HC) 2.5 % rectal cream Place 1 Application rectally 2 (two) times daily. 10/31/23   Kennedy Charmaine CROME, NP  Insulin  Pen Needle (B-D ULTRAFINE III SHORT PEN) 31G X 8 MM MISC Use as directed three time daily to inject insulin  08/03/23   Nida, Gebreselassie W, MD  insulin  regular human CONCENTRATED (HUMULIN  R U-500 KWIKPEN) 500 UNIT/ML KwikPen Inject 50 Units into the skin 3 (three) times daily with meals. Only when blood glucose is above 90 and eating. 02/02/24   Nida, Gebreselassie W, MD  ipratropium-albuterol  (DUONEB) 0.5-2.5 (3) MG/3ML SOLN Take 3 mLs by nebulization every 6 (six) hours as needed. 11/14/23   Darlean Ozell NOVAK, MD  isosorbide  mononitrate (IMDUR ) 120 MG 24 hr tablet Take 120 mg by mouth in the morning.    [provider]  Lancets [facility] DELICA PLUS LANCET33G) MISC USE TO check blood sugar FOUR TIMES DAILY AS DIRECTED 06/11/20   Nida, Gebreselassie W, MD  linaclotide  (LINZESS ) 145 MCG CAPS capsule Take 1 capsule (145 mcg total) by mouth daily before breakfast. 12/13/23   Kennedy Charmaine CROME, NP  meclizine  (ANTIVERT ) 25 MG tablet Take 25 mg by mouth every 6 (six) hours as needed for dizziness or nausea.    [provider]  metoprolol  tartrate (LOPRESSOR ) 100 MG tablet Take 1 tablet (100 mg total) by mouth 2 (two) times daily. OFFICE VISIT NEEDED BEFORE ADDITIONAL REFILLS 07/16/19   Alvan Dorn FALCON, MD  modafinil  (PROVIGIL ) 200 MG tablet Take 200 mg by mouth daily. 09/11/19   [provider]  mupirocin ointment (BACTROBAN) 2 % Apply 1 Application topically 2 (two) times daily. 08/11/23   [provider]  nitroGLYCERIN  (NITROLINGUAL ) 0.4 MG/SPRAY spray Place 1 spray under the tongue every 5 (five) minutes x 3 doses as needed for chest pain. 01/30/19   Long, Fonda MATSU, MD  [facility] VERIO test strip  USE TO check blood sugar FOUR TIMES DAILY AS DIRECTED 06/11/20   Nida, Gebreselassie W, MD  pantoprazole  (PROTONIX ) 40 MG tablet Take 1 tablet (40 mg total) by mouth 2 (two) times daily before a meal. 08/01/23   Kennedy Charmaine CROME, NP  polyethylene glycol powder (GLYCOLAX /MIRALAX ) 17 GM/SCOOP powder Take 17 g once daily in 8 ounces of water . 08/01/23   Kennedy Charmaine CROME, NP  potassium chloride  SA (KLOR-CON  M) 20 MEQ tablet Take 1 tablet (20 mEq total) by mouth daily. While Taking Lasix /Furosemide  Patient taking differently: Take 20 mEq by mouth 2 (two) times daily. 10/14/22   Pearlean Manus, MD  rosuvastatin  (CRESTOR ) 20 MG tablet Take 20 mg by mouth in the morning.    [provider]  TRELEGY ELLIPTA  100-62.5-25 MCG/ACT  AEPB INHALE 1 PUFF ONCE DAILY 04/20/24   Wert, Advik B, MD  valsartan  (DIOVAN ) 40 MG tablet Take 1 tablet (40 mg total) by mouth daily. 02/02/24   Nida, Gebreselassie W, MD    Allergies: Contrast media [iodinated contrast media] and Iohexol    Review of Systems  Updated Vital Signs BP 92/68 (BP Location: Left Arm)   Pulse 88   Temp 98.2 F (36.8 C) (Oral)   Resp 20   Ht 5' 8 (1.727 m)   Wt 134.7 kg   SpO2 93%   BMI 45.16 kg/m   Physical Exam Vitals and nursing note reviewed.  Constitutional:      General: He is not in acute distress.    Appearance: He is well-developed.  HENT:     Head: Normocephalic and atraumatic.     Right Ear: External ear normal.     Left Ear: External ear normal.     Nose: Nose normal.  Eyes:     Extraocular Movements: Extraocular movements intact.     Conjunctiva/sclera: Conjunctivae normal.     Pupils: Pupils are equal, round, and reactive to light.  Abdominal:     General: There is no distension.     Palpations: Abdomen is soft. There is no mass.     Tenderness: There is no abdominal tenderness. There is left CVA tenderness. There is no right CVA tenderness or guarding.  Musculoskeletal:     Cervical back: Normal range of  motion and neck supple.     Comments: No spinal midline TTP in cervical, thoracic, or lumbar spine. No stepoffs noted.   Motor: Muscle bulk and tone are normal. Strength is 5/5 in hip flexion, knee flexion and extension, ankle dorsiflexion and plantar flexion bilaterally. Full strength of great toe dorsiflexion bilaterally.  Sensory: Intact sensation to light touch in L2 though S1 dermatomes bilaterally.   Skin:    General: Skin is warm and dry.     Comments: No rash on left flank.  No tenderness with light touch  Neurological:     Mental Status: He is alert. Mental status is at baseline.  Psychiatric:        Mood and Affect: Mood normal.        Behavior: Behavior normal.     (all labs ordered are listed, but only abnormal results are displayed) Labs Reviewed  CBC WITH DIFFERENTIAL/PLATELET - Abnormal; Notable for the following components:      Result Value   WBC 11.1 (*)    Hemoglobin 18.1 (*)    HCT 54.1 (*)    Eosinophils Absolute 0.6 (*)    Basophils Absolute 0.2 (*)    All other components within normal limits  URINALYSIS, ROUTINE W REFLEX MICROSCOPIC - Abnormal; Notable for the following components:   Glucose, UA >=500 (*)    All other components within normal limits  URINE CULTURE  BRAIN NATRIURETIC PEPTIDE    EKG: None  Radiology: CT Renal Stone Study Result Date: 06/04/2024 CLINICAL DATA:  L flank pain. EXAM: CT ABDOMEN AND PELVIS WITHOUT CONTRAST TECHNIQUE: Multidetector CT imaging of the abdomen and pelvis was performed following the standard protocol without IV contrast. RADIATION DOSE REDUCTION: This exam was performed according to the departmental dose-optimization program which includes automated exposure control, adjustment of the mA and/or kV according to patient size and/or use of iterative reconstruction technique. COMPARISON:  CT scan abdomen and pelvis from 08/29/2023. FINDINGS: Lower chest:z there is a 4 x 6 mm solid noncalcified nodule in  the middle lobe  abutting the pleura (series 4, image 14), which is unchanged since the prior study from 06/11/2020 and favored benign in etiology. There are subpleural atelectatic changes in the visualized lung bases. No overt consolidation. No pleural effusion. The heart is normal in size. No pericardial effusion. Hepatobiliary: The liver is normal in size. Non-cirrhotic configuration. No suspicious mass. No intrahepatic or extrahepatic bile duct dilation. No calcified gallstones. Normal gallbladder wall thickness. No pericholecystic inflammatory changes. Pancreas: Unremarkable. No pancreatic ductal dilatation or surrounding inflammatory changes. Spleen: Within normal limits. No focal lesion. Adrenals/Urinary Tract: Adrenal glands are unremarkable. No suspicious renal mass within the limitations of this unenhanced exam. There is a nearly completely exophytic 1.3 x 1.5 cm structure arising from the right kidney lower pole, inferiorly, which can be characterized as a cyst. No nephroureterolithiasis or obstructive uropathy. Bilateral scattered vascular calcifications noted. No obstructive uropathy on either side. Unremarkable urinary bladder. Stomach/Bowel: No disproportionate dilation of the small or large bowel loops. No evidence of abnormal bowel wall thickening or inflammatory changes. The appendix is unremarkable. Vascular/Lymphatic: No ascites or pneumoperitoneum. No abdominal or pelvic lymphadenopathy, by size criteria. No aneurysmal dilation of the major abdominal arteries. There are mild peripheral atherosclerotic vascular calcifications of the aorta and its major branches. Reproductive: Normal size prostate. Symmetric seminal vesicles. Other: There are bilateral small fat containing inguinal hernias, tiny fat containing umbilical hernia and small, epigastric, 2 adjacent, right paramedian fat containing ventral hernias. The soft tissues and abdominal wall are otherwise unremarkable. Musculoskeletal: No suspicious osseous  lesions. There are mild - moderate multilevel degenerative changes in the visualized spine. There is undisplaced fracture of the posterior aspect of the left eleventh rib. No callus formation seen. IMPRESSION: 1. No nephroureterolithiasis or obstructive uropathy. 2. No acute inflammatory process identified within the abdomen or pelvis. 3. Acute/early subacute, undisplaced fracture of left eleventh rib. Correlate clinically. 4. Multiple other nonacute observations, as described above. Aortic Atherosclerosis (ICD10-I70.0). Electronically Signed   By: Ree Molt M.D.   On: 06/04/2024 17:15     Procedures   Medications Ordered in the ED  lidocaine  (LIDODERM ) 5 % 1 patch (1 patch Transdermal Patch Applied 06/04/24 1645)  oxyCODONE -acetaminophen  (PERCOCET/ROXICET) 5-325 MG per tablet 1 tablet (1 tablet Oral Given 06/04/24 1607)                                    Medical Decision Making Amount and/or Complexity of Data Reviewed Radiology: ordered.  Risk Prescription drug management.   63 year old male with a history of kidney stones, atrial fibrillation on Eliquis , COPD, tobacco use presents emergency department with left-sided flank pain that started 2 days ago  Initial Ddx:  Muscle strain, disc herniation, kidney stone, pyelonephritis, shingles  MDM/Course:  Patient presents emergency department left-sided flank pain.  Does not recall injuring the area initially.  On exam does have tenderness to palpation of his left flank but no overlying rashes.  Does have a history of kidney stones and had a CT stone protocol that did not show any kidney stones but did show a left-sided rib fracture.  When he thinks about it he believes he may have hit the door the other day that could have caused this injury.  He is not splinting at all.  After oxycodone  and lidocaine  patch his pain was well-controlled.  He is satting well on his home oxygen .  Feel that he is suitable for  outpatient treatment given the  fact that he is a well-appearing and that his pain is well-controlled at this time.  Oxycodone  sent to his pharmacy.  Patient discharged home with incentive spirometer.  Upon follow-up with his primary doctor in several days as well.  This patient presents to the ED for concern of complaints listed in HPI, this involves an extensive number of treatment options, and is a complaint that carries with it a high risk of complications and morbidity. Disposition including potential need for admission considered.   Dispo: DC Home. Return precautions discussed including, but not limited to, those listed in the AVS. Allowed pt time to ask questions which were answered fully prior to dc.  Records reviewed Outpatient Clinic Notes The following labs were independently interpreted: Urinalysis and show no acute abnormality I independently reviewed the following imaging with scope of interpretation limited to determining acute life threatening conditions related to emergency care: CT Abdomen/Pelvis and agree with the radiologist interpretation with the following exceptions: none I personally reviewed and interpreted cardiac monitoring: normal sinus rhythm  I personally reviewed and interpreted the pt's EKG: see above for interpretation  I have reviewed the patients home medications and made adjustments as needed  Portions of this note were generated with Dragon dictation software. Dictation errors may occur despite best attempts at proofreading.     Final diagnoses:  Closed fracture of one rib of left side, initial encounter    ED Discharge Orders          Ordered    lidocaine  (LIDODERM ) 5 %  Every 24 hours        06/04/24 1727    oxyCODONE  (ROXICODONE ) 5 MG immediate release tablet  Every 4 hours PRN        06/04/24 1727               Yolande Lamar BROCKS, MD 06/04/24 1731

## 2024-06-05 DIAGNOSIS — E538 Deficiency of other specified B group vitamins: Secondary | ICD-10-CM | POA: Diagnosis not present

## 2024-06-05 LAB — URINE CULTURE: Culture: NO GROWTH

## 2024-06-07 ENCOUNTER — Encounter: Payer: Self-pay | Admitting: "Endocrinology

## 2024-06-07 ENCOUNTER — Ambulatory Visit (INDEPENDENT_AMBULATORY_CARE_PROVIDER_SITE_OTHER): Admitting: "Endocrinology

## 2024-06-07 VITALS — BP 112/64 | HR 84 | Ht 68.0 in | Wt 293.8 lb

## 2024-06-07 DIAGNOSIS — F17200 Nicotine dependence, unspecified, uncomplicated: Secondary | ICD-10-CM

## 2024-06-07 DIAGNOSIS — Z794 Long term (current) use of insulin: Secondary | ICD-10-CM

## 2024-06-07 DIAGNOSIS — I10 Essential (primary) hypertension: Secondary | ICD-10-CM

## 2024-06-07 DIAGNOSIS — E782 Mixed hyperlipidemia: Secondary | ICD-10-CM

## 2024-06-07 DIAGNOSIS — E1159 Type 2 diabetes mellitus with other circulatory complications: Secondary | ICD-10-CM | POA: Diagnosis not present

## 2024-06-07 MED ORDER — EMPAGLIFLOZIN 25 MG PO TABS: 25.0000 mg | ORAL_TABLET | Freq: Every day | ORAL | 1 refills | Status: AC

## 2024-06-07 MED ORDER — ICOSAPENT ETHYL 1 G PO CAPS: 1.0000 g | ORAL_CAPSULE | Freq: Two times a day (BID) | ORAL | 3 refills | Status: AC

## 2024-06-07 MED ORDER — HUMULIN R U-500 KWIKPEN 500 UNIT/ML ~~LOC~~ SOPN: 30.0000 [IU] | PEN_INJECTOR | Freq: Three times a day (TID) | SUBCUTANEOUS | 1 refills | Status: DC

## 2024-06-07 NOTE — Progress Notes (Signed)
 06/07/2024  Endocrinology follow-up note   Subjective:    Patient ID: [name], male    DOB: 05/19/1961.  he is being seen in follow-up  for management of currently uncontrolled symptomatic type 2 diabetes, hypertension, hyperlipidemia, obesity. PMD:  Shona Norleen PEDLAR, MD.   Past Medical History:  Diagnosis Date   Abnormal myocardial perfusion study 01/01/2011   there a small to moderate sized inferobasal scar   Barrett's esophagus    Cataracts, bilateral    Chronic kidney disease    hx of kidney stones   Claudication (HCC) 11/16/2011   PV test perform shows normal   COPD (chronic obstructive pulmonary disease) (HCC)    Depression    Diabetes (HCC)    type 2 diabetes mellitus   Dysrhythmia    GERD (gastroesophageal reflux disease)    Glaucoma    Hernia of abdominal wall    History of radiation therapy    Right lung, SBRT- 04/16/21-04/30/21- Dr. Lynwood Nasuti   HTN (hypertension)    Hyperlipidemia    Morbid obesity (HCC)    Myocardial infarction (HCC) 2008,2009,2009   OSA (obstructive sleep apnea)    on cpap   PAF (paroxysmal atrial fibrillation) (HCC)    S/P colonoscopy 2009   3-4 mm transverse colon erosions likely secondary to  ASA   S/P endoscopy 10/2010   moderate erosive gastritis, Barrett's esophagus 1-2cm   Sleep apnea    SOB (shortness of breath) 11/03/2007   2D Echo EF 50%-55%   Stroke (HCC) 2017   Past Surgical History:  Procedure Laterality Date   BIOPSY N/A 04/29/2015   Procedure: BIOPSY;  Surgeon: Margo LITTIE Haddock, MD;  Location: [facility];  Service: Endoscopy;  Laterality: N/A;   BIOPSY  08/01/2018   Procedure: BIOPSY;  Surgeon: Haddock Margo LITTIE, MD;  Location: [facility];  Service: Endoscopy;;  esophageal   BIOPSY  10/12/2021   Procedure: BIOPSY;  Surgeon: Cindie Carlin POUR, DO;  Location: [facility];  Service: Endoscopy;;   BIOPSY  05/09/2023   Procedure: BIOPSY;  Surgeon: Cindie Carlin POUR, DO;  [facility]: [facility];  Service: Endoscopy;;    CABG X 4  03/2008   CARDIAC CATHETERIZATION  2009   stent placement to the left circumflex a 2.25    CARDIAC CATHETERIZATION  07/08/2010   CARDIAC CATHETERIZATION N/A 11/02/2019   COLONOSCOPY WITH PROPOFOL  N/A 09/27/2017   normal ileum, twenty 4 to 8 mm polyps in the sigmoid colon, descending colon, splenic flexure, transverse colon, ascending colon, cecum.  An additional three 2 to 4 mm polyps in the rectum and the descending colon.  Diverticulosis, internal hemorrhoids.  Surgical pathology found the polyps to be one fragment of hyperplastic polyp and 22 fragments of tubular adenoma..  Recommended repeat colonoscop   COLONOSCOPY WITH PROPOFOL  N/A 10/12/2021   Procedure: COLONOSCOPY WITH PROPOFOL ;  Surgeon: Cindie Carlin POUR, DO;  [facility]: [facility];  Service: Endoscopy;  Laterality: N/A;  1:30pm   CORONARY ARTERY BYPASS GRAFT  2008   4 vessels   CORONARY STENT PLACEMENT  12/29/12   CORONARY STENT PLACEMENT  12/2012   ESOPHAGEAL BRUSHING  05/09/2023   Procedure: ESOPHAGEAL BRUSHING;  Surgeon: Cindie Carlin POUR, DO;  [facility];  Service: Endoscopy;;   ESOPHAGEAL DILATION N/A 04/29/2015   Procedure: ESOPHAGEAL DILATION 15 mm, 16 mm;  Surgeon: Margo LITTIE Haddock, MD;  Location: [facility];  Service: Endoscopy;  Laterality: N/A;   ESOPHAGOGASTRODUODENOSCOPY  09/2011   Barrett's  esophagus, no dysplasia on biopsies. Distal esophagitis. Status post dilation. Moderate gastritis and duodenitis, but biopsies benign. Next EGD in November 2015 for surveillance of Barrett's esophagus.   ESOPHAGOGASTRODUODENOSCOPY (EGD) WITH PROPOFOL  N/A 04/29/2015   SLF: 1. Barretts esophagus 2. Moderate non-erosive gastritis.    ESOPHAGOGASTRODUODENOSCOPY (EGD) WITH PROPOFOL  N/A 08/01/2018   Barrett's, repeat in 5 years. Empiric dilatation, mild gastritis   ESOPHAGOGASTRODUODENOSCOPY (EGD) WITH PROPOFOL  N/A 05/09/2023   Procedure: ESOPHAGOGASTRODUODENOSCOPY (EGD) WITH PROPOFOL ;  Surgeon: Cindie Carlin POUR, DO;   [facility];  Service: Endoscopy;  Laterality: N/A;  730am, asa 4   HERNIA REPAIR     ventral hernia repair   LEFT HEART CATHETERIZATION WITH CORONARY/GRAFT ANGIOGRAM N/A 12/29/2012   Procedure: LEFT HEART CATHETERIZATION WITH EL BILE;  Surgeon: Debby DELENA Sor, MD;  Location: [facility] CATH LAB;  Service: Cardiovascular;  Laterality: N/A;   ORIF FIBULA FRACTURE Right 06/03/2015   Procedure: OPEN REDUCTION INTERNAL FIXATION (ORIF) DISTAL FIBULA  FRACTURE;  Surgeon: Maude LELON Right, MD;  Location: [facility];  Service: Orthopedics;  Laterality: Right;   PERCUTANEOUS CORONARY STENT INTERVENTION (PCI-S)  12/29/2012   Procedure: PERCUTANEOUS CORONARY STENT INTERVENTION (PCI-S);  Surgeon: Debby DELENA Sor, MD;  Location: [facility] CATH LAB;  Service: Cardiovascular;;   POLYPECTOMY  09/27/2017   Procedure: POLYPECTOMY;  Surgeon: Harvey Margo CROME, MD;  [facility];  Service: Endoscopy;;  cecal polyp, ascending polyps x6, transverse colon polyps x6, splenic flexure polyps x2, descending colon polyps x6, sigmoid  colon polyp x1, rectal polyp x1    POLYPECTOMY  10/12/2021   Procedure: POLYPECTOMY;  Surgeon: Cindie Carlin POUR, DO;  [facility];  Service: Endoscopy;;   SAVORY DILATION  09/06/2011   Procedure: SAVORY DILATION;  Surgeon: Margo CHRISTELLA Harvey, MD;  Location: [facility];  Service: Endoscopy;  Laterality: N/A;  Dilated with 15mm   SAVORY DILATION N/A 08/01/2018   Procedure: SAVORY DILATION;  Surgeon: Harvey Margo CROME, MD;  [facility];  Service: Endoscopy;  Laterality: N/A;   Social History   Socioeconomic History   Marital status: Divorced    Spouse name: Not on file   Number of children: Not on file   Years of education: Not on file   Highest education level: Not on file  Occupational History   Not on file  Tobacco Use   Smoking status: Every Day    Current packs/day: 0.50    Average packs/day: 0.5 packs/day for 30.0 years (15.0 ttl pk-yrs)    Types:  Cigarettes   Smokeless tobacco: Never   Tobacco comments:    Smokes 0.25 packs per day ARJ, 12/29/22  Vaping Use   Vaping status: Never Used  Substance and Sexual Activity   Alcohol use: Yes    Comment: occ beer   Drug use: No   Sexual activity: Never  Other Topics Concern   Not on file  Social History Narrative   Not on file   Social Drivers of Health   Financial Resource Strain: Low Risk  (03/22/2023)   Received from [facility]   Overall Financial Resource Strain (CARDIA)    Difficulty of Paying Living Expenses: Not very hard  Food Insecurity: No Food Insecurity (03/22/2023)   Received from [facility]   Hunger Vital Sign    Within the past 12 months, you worried that your food would run out before you got the money to buy more.: Never true    Within the past 12 months, the food you  bought just didn't last and you didn't have money to get more.: Never true  Transportation Needs: No Transportation Needs (03/22/2023)   Received from [facility]   PRAPARE - Transportation    Lack of Transportation (Medical): No    Lack of Transportation (Non-Medical): No  Physical Activity: Not on file  Stress: Not on file  Social Connections: Unknown (03/03/2022)   Received from [facility]   Social Network    Social Network: Not on file   Outpatient Encounter Medications as of 06/07/2024  Medication Sig   diclofenac Sodium (VOLTAREN) 1 % GEL Apply 1 Application topically 4 (four) times daily as needed (pain).   [DISCONTINUED] insulin  regular human CONCENTRATED (HUMULIN  R U-500 KWIKPEN) 500 UNIT/ML KwikPen Inject 50 Units into the skin 3 (three) times daily with meals. Only when blood glucose is above 90 and eating. (Patient taking differently: Inject 60 Units into the skin 3 (three) times daily with meals. Only when blood glucose is above 90 and eating.)   acetaminophen  (TYLENOL ) 500 MG tablet Take 500 mg by mouth every 6 (six) hours as needed for moderate pain.   albuterol  (VENTOLIN   HFA) 108 (90 Base) MCG/ACT inhaler Inhale 1-2 puffs into the lungs every 4 (four) hours as needed for wheezing or shortness of breath.   apixaban  (ELIQUIS ) 5 MG TABS tablet Take 1 tablet (5 mg total) by mouth 2 (two) times daily.   benzonatate  (TESSALON ) 200 MG capsule Take 1 capsule (200 mg total) by mouth 3 (three) times daily as needed. (Patient not taking: Reported on 06/07/2024)   bisoprolol (ZEBETA) 5 MG tablet Take 5 mg by mouth daily.   blood glucose meter kit and supplies Dispense based on patient and insurance preference. Use up to two times daily as directed. (FOR ICD-E11.65)   Blood Glucose Monitoring Suppl (ONETOUCH VERIO) w/Device KIT 1 each by Does not apply route as needed.   Cholecalciferol (VITAMIN D ) 50 MCG (2000 UT) tablet Take 2,000 Units by mouth daily. (Patient not taking: Reported on 06/07/2024)   clonazePAM (KLONOPIN) 1 MG tablet Take 1 mg by mouth 2 (two) times daily as needed.   Continuous Glucose Receiver (FREESTYLE LIBRE 3 READER) DEVI 1 Piece by Does not apply route once as needed for up to 1 dose.   Continuous Glucose Sensor (FREESTYLE LIBRE 3 PLUS SENSOR) MISC Change sensor every 15 days.   Cyanocobalamin (VITAMIN B-12 IJ) Inject 1,000 mcg as directed every 30 (thirty) days.   diazepam  (VALIUM ) 5 MG tablet Take 5 mg by mouth 2 (two) times daily. (Patient not taking: Reported on 06/07/2024)   Dulaglutide  (TRULICITY ) 4.5 MG/0.5ML SOAJ Inject 4.5 mg as directed once a week.   DULoxetine (CYMBALTA) 30 MG capsule Take 30 mg by mouth daily. (Patient not taking: Reported on 06/07/2024)   empagliflozin  (JARDIANCE ) 25 MG TABS tablet Take 1 tablet (25 mg total) by mouth daily before breakfast.   escitalopram  (LEXAPRO ) 20 MG tablet Take 20 mg by mouth in the morning.   fenofibrate  (TRICOR ) 145 MG tablet Take 145 mg by mouth in the morning.   furosemide  (LASIX ) 40 MG tablet Take 40 mg by mouth daily.   gabapentin  (NEURONTIN ) 300 MG capsule Take 300 mg by mouth 3 (three) times daily.    hydrocortisone  (ANUSOL -HC) 2.5 % rectal cream Place 1 Application rectally 2 (two) times daily. (Patient not taking: Reported on 06/07/2024)   Insulin  Pen Needle (B-D ULTRAFINE III SHORT PEN) 31G X 8 MM MISC Use as directed three time daily to  inject insulin    insulin  regular human CONCENTRATED (HUMULIN  R U-500 KWIKPEN) 500 UNIT/ML KwikPen Inject 30 Units into the skin 3 (three) times daily with meals. Only when blood glucose is above 90 and eating.   ipratropium-albuterol  (DUONEB) 0.5-2.5 (3) MG/3ML SOLN Take 3 mLs by nebulization every 6 (six) hours as needed.   isosorbide  mononitrate (IMDUR ) 120 MG 24 hr tablet Take 120 mg by mouth in the morning.   Lancets (ONETOUCH DELICA PLUS LANCET33G) MISC USE TO check blood sugar FOUR TIMES DAILY AS DIRECTED   lidocaine  (LIDODERM ) 5 % Place 1 patch onto the skin daily. Remove & Discard patch within 12 hours or as directed by MD   linaclotide  (LINZESS ) 145 MCG CAPS capsule Take 1 capsule (145 mcg total) by mouth daily before breakfast.   meclizine  (ANTIVERT ) 25 MG tablet Take 25 mg by mouth every 6 (six) hours as needed for dizziness or nausea.   metoprolol  tartrate (LOPRESSOR ) 100 MG tablet Take 1 tablet (100 mg total) by mouth 2 (two) times daily. OFFICE VISIT NEEDED BEFORE ADDITIONAL REFILLS (Patient not taking: Reported on 06/07/2024)   modafinil  (PROVIGIL ) 200 MG tablet Take 200 mg by mouth daily.   mupirocin ointment (BACTROBAN) 2 % Apply 1 Application topically 2 (two) times daily.   nitroGLYCERIN  (NITROLINGUAL ) 0.4 MG/SPRAY spray Place 1 spray under the tongue every 5 (five) minutes x 3 doses as needed for chest pain.   ONETOUCH VERIO test strip USE TO check blood sugar FOUR TIMES DAILY AS DIRECTED   oxyCODONE  (ROXICODONE ) 5 MG immediate release tablet Take 1 tablet (5 mg total) by mouth every 4 (four) hours as needed for severe pain (pain score 7-10).   pantoprazole  (PROTONIX ) 40 MG tablet Take 1 tablet (40 mg total) by mouth 2 (two) times daily before a  meal.   polyethylene glycol powder (GLYCOLAX /MIRALAX ) 17 GM/SCOOP powder Take 17 g once daily in 8 ounces of water . (Patient not taking: Reported on 06/07/2024)   potassium chloride  SA (KLOR-CON  M) 20 MEQ tablet Take 1 tablet (20 mEq total) by mouth daily. While Taking Lasix /Furosemide  (Patient taking differently: Take 20 mEq by mouth 2 (two) times daily.)   rosuvastatin  (CRESTOR ) 20 MG tablet Take 20 mg by mouth in the morning.   TRELEGY ELLIPTA  100-62.5-25 MCG/ACT AEPB INHALE 1 PUFF ONCE DAILY   valsartan  (DIOVAN ) 40 MG tablet Take 1 tablet (40 mg total) by mouth daily.   [DISCONTINUED] empagliflozin  (JARDIANCE ) 10 MG TABS tablet Take 1 tablet (10 mg total) by mouth daily before breakfast.   No facility-administered encounter medications on file as of 06/07/2024.    ALLERGIES: Allergies  Allergen Reactions   Contrast Media [Iodinated Contrast Media] Other (See Comments)    Pt must be premedicated before given contrast media - stops heart   Iohexol Other (See Comments)     Consult with radiologist before pre meds are given.Desc: PT. STATES HEART STOPPED HAS TO BE PREMED.     VACCINATION STATUS: Immunization History  Administered Date(s) Administered   Influenza Inj Mdck Quad Pf 09/25/2018   Influenza Split 08/23/2014, 10/23/2015   Influenza,inj,Quad PF,6+ Mos 08/03/2016, 07/10/2019, 09/22/2020, 09/14/2021, 07/31/2022   Influenza-Unspecified 09/26/2012    Diabetes He presents for his follow-up diabetic visit. He has type 2 diabetes mellitus. Onset time: He was diagnosed at approximate age of 42 years. His disease course has been fluctuating. There are no hypoglycemic associated symptoms. Pertinent negatives for hypoglycemia include no confusion, headaches, pallor or seizures. Pertinent negatives for diabetes include no blurred vision,  no chest pain, no fatigue, no polydipsia, no polyphagia, no polyuria and no weakness. There are no hypoglycemic complications. Symptoms are improving.  Diabetic complications include a CVA and heart disease. Risk factors for coronary artery disease include diabetes mellitus, dyslipidemia, hypertension, male sex, obesity, tobacco exposure, sedentary lifestyle and family history. He is compliant with treatment some of the time. His weight is fluctuating minimally. He is following a generally unhealthy diet. When asked about meal planning, he reported none. He has not had a previous visit with a dietitian. He never participates in exercise. His home blood glucose trend is fluctuating minimally. His breakfast blood glucose range is generally 110-130 mg/dl. His lunch blood glucose range is generally 110-130 mg/dl. His dinner blood glucose range is generally 110-130 mg/dl. His bedtime blood glucose range is generally 110-130 mg/dl. His overall blood glucose range is 110-130 mg/dl. [name] presents with his CGM which shows controlled glycemic profile with 89% time in range, 5% level 1 hyperglycemia.  He also has 6% level 1 hypoglycemia.  His recent A1c was 6.9%.    ) An ACE inhibitor/angiotensin II receptor blocker is being taken. He does not see a podiatrist.Eye exam is not current.  Hyperlipidemia This is a chronic problem. The current episode started more than 1 year ago. The problem is uncontrolled. Recent lipid tests were reviewed and are high. Exacerbating diseases include diabetes and obesity. Pertinent negatives include no chest pain, myalgias or shortness of breath. Current antihyperlipidemic treatment includes statins. Compliance problems include medication cost and psychosocial issues.  Risk factors for coronary artery disease include dyslipidemia, diabetes mellitus, hypertension, male sex, obesity, family history and a sedentary lifestyle.  Hypertension This is a chronic problem. The current episode started more than 1 year ago. The problem is uncontrolled. Pertinent negatives include no blurred vision, chest pain, headaches, neck pain, palpitations or  shortness of breath. Risk factors for coronary artery disease include diabetes mellitus, dyslipidemia, male gender, obesity, family history, sedentary lifestyle and smoking/tobacco exposure. Past treatments include ACE inhibitors. Hypertensive end-organ damage includes CVA.    Review of systems  Constitutional: + Minimally fluctuating body weight,  current  Body mass index is 44.67 kg/m. , no fatigue, no subjective hyperthermia, no subjective hypothermia   Objective:    BP 112/64   Pulse 84   Ht 5' 8 (1.727 m)   Wt 293 lb 12.8 oz (133.3 kg)   BMI 44.67 kg/m   Wt Readings from Last 3 Encounters:  06/07/24 293 lb 12.8 oz (133.3 kg)  06/04/24 297 lb (134.7 kg)  04/17/24 295 lb (133.8 kg)      Physical Exam- Limited  Constitutional:  Body mass index is 44.67 kg/m. , not in acute distress, normal state of mind    CMP ( most recent) CMP     Component Value Date/Time   NA 139 08/29/2023 2124   NA 134 05/10/2023 1404   K 3.6 08/29/2023 2124   CL 101 08/29/2023 2124   CO2 31 08/29/2023 2124   GLUCOSE 95 08/29/2023 2124   BUN 10 08/29/2023 2124   BUN 19 05/10/2023 1404   CREATININE 1.25 (H) 08/29/2023 2124   CALCIUM  9.1 08/29/2023 2124   PROT 6.9 05/10/2023 1404   ALBUMIN 4.3 05/10/2023 1404   AST 22 05/10/2023 1404   ALT 20 05/10/2023 1404   ALKPHOS 85 05/10/2023 1404   BILITOT 0.5 05/10/2023 1404   GFRNONAA >60 08/29/2023 2124   GFRAA 93 12/04/2020 0000   Diabetic Labs (most recent): Lab Results  Component Value Date   HGBA1C 6.9 02/02/2024   HGBA1C 9.2 (A) 10/04/2023   HGBA1C 9.2 (A) 05/17/2023   MICROALBUR 10 06/26/2020     Lipid Panel ( most recent) Lipid Panel     Component Value Date/Time   CHOL 134 05/10/2023 1404   TRIG 487 (H) 05/10/2023 1404   HDL 24 (L) 05/10/2023 1404   CHOLHDL 5.6 (H) 05/10/2023 1404   CHOLHDL 6.1 04/28/2014 0121   VLDL 61 (H) 04/28/2014 0121   LDLCALC 40 05/10/2023 1404     Assessment & Plan:   1. DM type 2 causing  vascular disease (HCC)  - Patient has currently uncontrolled symptomatic type 2 DM since  63 years of age.  [name] presents with his CGM which shows controlled glycemic profile with 89% time in range, 5% level 1 hyperglycemia.  He also has 6% level 1 hypoglycemia.  His recent A1c was 6.9%.    -his diabetes is complicated by coronary artery disease, CVA, obesity/sedentary life, chronic heavy smoking and [name] remains at extremely high risk  for more acute and chronic complications which include CAD, CVA, CKD, retinopathy, and neuropathy. These are all discussed in detail with the patient.  - I have counseled him on diet management and weight loss, by adopting a carbohydrate restricted/protein rich diet.  [name] is really struggling to connect the dots in his self-care.  Admittedly, he does not follow the routine meal and incretin times recommended.  However he wishes to stay on the same regimen of insulin  and Trulicity .   - he acknowledges that there is a room for improvement in his food and drink choices. - Suggestion is made for him to avoid simple carbohydrates  from his diet including Cakes, Sweet Desserts, Ice Cream, Soda (diet and regular), Sweet Tea, Candies, Chips, Cookies, Store Bought Juices, Alcohol in Excess of  1-2 drinks a day, Artificial Sweeteners,  Coffee Creamer, and Sugar-free Products, Lemonade. This will help patient to have more stable blood glucose profile and potentially avoid unintended weight gain.  - I encouraged him to switch to  unprocessed or minimally processed complex starch and increased protein intake (animal or plant source), fruits, and vegetables. Whole food, plant-based diet was discussed in detail with him.  - he is advised to stick to a routine mealtimes to eat 3 meals  a day and avoid unnecessary snacks ( to snack only to correct hypoglycemia).   - I have approached him with the following individualized plan to manage diabetes and patient  agrees:   -He has social problems, not enough support.  Reportedly sleeps extended hours, could be missing some meals.  He is at risk of hypoglycemia.   He used more insulin  than recommended during his last visit. -In the interest of avoiding hypoglycemia from inadvertent use of U-500 insulin , I advised him to lower his dose to 30 units with breakfast, 30 units with lunch and 30 units with supper  for Premeal blood glucose readings above 90 mg per DL,  associated with strict monitoring of blood glucose 4 times a day-before meals and at bedtime, using his CGM continuously. -He is advised to use his meter as a backup when he has problem with his CGM.  - He does not tolerate metformin due to GI side effects.  -He is tolerating Trulicity , advised to continue Trulicity  4.5 mg subcutaneously weekly.   Side effects and precautions discussed with him.  He remains a heavy smoker, and with significant hypertriglyceridemia, at risk for  pancreatitis. -Patient is encouraged to call clinic for blood glucose levels less than 70 or above 200 mg /dl. -He is tolerating and benefiting from low-dose SGLT2 inhibitors.  I discussed and increase Jardiance  to 25 mg p.o. daily at breakfast.  Side effects and precautions specifically urinary tract infection and genital mycotic infections was discussed with him.   - Patient specific target  A1c;  LDL, HDL, Triglycerides,  were discussed in detail.  2) BP/HTN:  -His blood pressure is controlled at 112/64.  He is advised to continue his current blood pressure medications including bisoprolol 5 mg p.o. daily, Imdur  120 mg p.o. daily, valsartan  40 mg p.o. daily at breakfast.   She also has Lasix  as needed. The patient was counseled on the dangers of tobacco use, and was advised to quit.  Reviewed strategies to maximize success, including removing cigarettes and smoking materials from environment.  3) Lipids/HPL: He has had history of severe hypertriglyceridemia, recently  improving to 230 from 469, mainly associated with improvement in his glycemic profile.  His LDL is on target at 40.  He is advised to continue Crestor  20 mg p.o. nightly.    His fenofibrate  will be switched to Vascepa  1 g p.o. twice daily.     4)  Weight/Diet: His BMI is 44.67--clearly complicating his diabetes care.  He is a candidate for modest weight loss.  CDE Consult has been  initiated , exercise, and detailed carbohydrates information provided.  5) vitamin D  deficiency: He is on ongoing supplement with vitamin D2 50,000 units weekly for 12 weeks.  6) Chronic Care/Health Maintenance:  -he  is on ACEI/ARB and Statin medications and  is encouraged to continue to follow up with Ophthalmology, Dentist,  Podiatrist at least yearly or according to recommendations, and advised to  quit smoking. I have recommended yearly flu vaccine and pneumonia vaccination at least every 5 years; moderate intensity exercise for up to 150 minutes weekly; and  sleep for at least 7 hours a day.   - I advised patient to maintain close follow up with Shona Norleen PEDLAR, MD for primary care needs.  I spent  42  minutes in the care of the patient today including review of labs from CMP, Lipids, Thyroid  Function, Hematology (current and previous including abstractions from other facilities); face-to-face time discussing  his blood glucose readings/logs, discussing hypoglycemia and hyperglycemia episodes and symptoms, medications doses, his options of short and long term treatment based on the latest standards of care / guidelines;  discussion about incorporating lifestyle medicine;  and documenting the encounter. Risk reduction counseling performed per USPSTF guidelines to reduce  obesity and cardiovascular risk factors.     Please refer to Patient Instructions for Blood Glucose Monitoring and Insulin /Medications Dosing Guide  in media tab for additional information. Please  also refer to  Patient Self Inventory in the Media   tab for reviewed elements of pertinent patient history.  [name] Server participated in the discussions, expressed understanding, and voiced agreement with the above plans.  All questions were answered to his satisfaction. he is encouraged to contact clinic should he have any questions or concerns prior to his return visit.     Follow up plan: - Return in about 4 months (around 10/07/2024) for F/U with Pre-visit Labs, Meter/CGM/Logs, A1c here.  Ronnie Earl, MD Phone: [phone]  Fax: [phone]   06/07/2024, 4:44 PM   This note was partially dictated with voice recognition software. Similar sounding words can be transcribed inadequately or may  not  be corrected upon review.

## 2024-06-07 NOTE — Patient Instructions (Signed)

## 2024-06-11 ENCOUNTER — Inpatient Hospital Stay

## 2024-06-11 ENCOUNTER — Encounter: Payer: Self-pay | Admitting: Hematology and Oncology

## 2024-06-11 ENCOUNTER — Inpatient Hospital Stay: Attending: Hematology and Oncology | Admitting: Hematology and Oncology

## 2024-06-11 VITALS — BP 96/72 | HR 82 | Temp 99.0°F | Resp 20 | Ht 69.49 in | Wt 285.5 lb

## 2024-06-11 DIAGNOSIS — J449 Chronic obstructive pulmonary disease, unspecified: Secondary | ICD-10-CM | POA: Diagnosis not present

## 2024-06-11 DIAGNOSIS — R911 Solitary pulmonary nodule: Secondary | ICD-10-CM | POA: Diagnosis not present

## 2024-06-11 DIAGNOSIS — D72829 Elevated white blood cell count, unspecified: Secondary | ICD-10-CM | POA: Diagnosis not present

## 2024-06-11 DIAGNOSIS — Z609 Problem related to social environment, unspecified: Secondary | ICD-10-CM | POA: Insufficient documentation

## 2024-06-11 DIAGNOSIS — Z8673 Personal history of transient ischemic attack (TIA), and cerebral infarction without residual deficits: Secondary | ICD-10-CM | POA: Diagnosis not present

## 2024-06-11 DIAGNOSIS — F17210 Nicotine dependence, cigarettes, uncomplicated: Secondary | ICD-10-CM | POA: Insufficient documentation

## 2024-06-11 DIAGNOSIS — I252 Old myocardial infarction: Secondary | ICD-10-CM | POA: Insufficient documentation

## 2024-06-11 DIAGNOSIS — Z85118 Personal history of other malignant neoplasm of bronchus and lung: Secondary | ICD-10-CM | POA: Diagnosis not present

## 2024-06-11 DIAGNOSIS — G8929 Other chronic pain: Secondary | ICD-10-CM | POA: Diagnosis not present

## 2024-06-11 DIAGNOSIS — Z800 Family history of malignant neoplasm of digestive organs: Secondary | ICD-10-CM | POA: Insufficient documentation

## 2024-06-11 DIAGNOSIS — M2550 Pain in unspecified joint: Secondary | ICD-10-CM | POA: Diagnosis not present

## 2024-06-11 DIAGNOSIS — Z951 Presence of aortocoronary bypass graft: Secondary | ICD-10-CM | POA: Insufficient documentation

## 2024-06-11 DIAGNOSIS — F17200 Nicotine dependence, unspecified, uncomplicated: Secondary | ICD-10-CM

## 2024-06-11 DIAGNOSIS — I515 Myocardial degeneration: Secondary | ICD-10-CM | POA: Insufficient documentation

## 2024-06-11 MED ORDER — NITROGLYCERIN 0.4 MG/SPRAY TL SOLN: 1.0000 | 1 refills | Status: AC | PRN

## 2024-06-11 NOTE — Assessment & Plan Note (Signed)
 Due to poor social situation with food insecurity, will place also social worker consult

## 2024-06-11 NOTE — Assessment & Plan Note (Signed)
 He has significant major cardiovascular comorbidities as well as putative diagnosis of lung cancer We discussed importance of smoking cessation but it does not appear that the patient is ready to quit

## 2024-06-11 NOTE — Assessment & Plan Note (Signed)
 The patient requested refill of nitroglycerin  I refilled his prescription today and recommend he gets future refill from his primary care doctor

## 2024-06-11 NOTE — Progress Notes (Signed)
  [facility] CONSULT NOTE  Patient Care Team: Shona Norleen PEDLAR, MD as PCP - General (Internal Medicine) Cindie Carlin POUR, DO as Consulting Physician (Internal Medicine) Jacqualyn Vinie NOVAK, MD as Referring Physician (Cardiology) Karis Daniel Motts, MD as Referring Physician (Otolaryngology) Leila Bound, OD (Optometry) Kennedy Charmaine CROME, NP as Nurse Practitioner (Gastroenterology) Lenis Ethelle ORN, MD as Consulting Physician (Endocrinology) Parrett, Madelin RAMAN, NP as Nurse Practitioner (Pulmonary Disease) Darlean Ozell NOVAK, MD as Consulting Physician (Pulmonary Disease)   ASSESSMENT & PLAN  Leukocytosis The cause of his chronic leukocytosis is due to smoking, chronic COPD, inhaled corticosteroids and class III obesity He does not need further workup or follow-up We discussed importance of cigarette smoking cessation  Current smoker He has significant major cardiovascular comorbidities as well as putative diagnosis of lung cancer We discussed importance of smoking cessation but it does not appear that the patient is ready to quit  History of lung cancer The patient was noted to have enlarging lung nodule status post SBRT in 2022 He is scheduled for CT imaging in September We discussed importance of regular follow-up with imaging  S/P CABG x 4 The patient requested refill of nitroglycerin  I refilled his prescription today and recommend he gets future refill from his primary care doctor  COPD (chronic obstructive pulmonary disease) (HCC) The patient appears to have active COPD with scattered wheezes and mild productive cough with white phlegm He will continue to use his inhalers and we discussed importance of smoking cessation  Poor social situation Due to poor social situation with food insecurity, will place also social worker consult  Orders Placed This Encounter  Procedures   Ambulatory referral to Social Work    Referral Priority:   Routine    Referral Type:    Consultation    Referral Reason:   Specialty Services Required    Referred to Provider:   Lonn Hicks, MD    Number of Visits Requested:   1    All questions were answered. The patient knows to call the clinic with any problems, questions or concerns. I spent 60 minutes counseling the patient face to face, counseling and review of plan of care.   Hicks Lonn, MD 06/11/2024 2:26 PM   CHIEF COMPLAINTS/PURPOSE OF CONSULTATION:  Chronic leukocytosis  HISTORY OF PRESENTING ILLNESS:  [name] 63 y.o. male is here because of elevated WBC.  He was found to have abnormal CBC from routine blood work I have the opportunity to review his CBC dated back to 2008  In 2008, he had surgery, His white count was as high as 25.1 Between 2008 to present, his white count fluctuate intermittently In 2023, it was at low at 3.4 However, majority of the time, his white blood cell count is high or within normal range On June 04, 2024, his white blood cell count was 11.1 He denies recent infection. The last prescription antibiotics was more than 3 months ago There is not reported symptoms of sinus congestion, urinary frequency/urgency or dysuria, diarrhea, joint swelling/pain or abnormal skin rash.  He has chronic joint pain He has chronic coughing due to smoking/COPD The patient was found to have abnormal lung nodule and have received possible diagnosis of lung cancer although without biopsy was never proven.  He did receive SBRT to his lung in 2022  The patient has no prior diagnosis of autoimmune disease  From COPD, he has chronic cough with productive white sputum.  He has received chronic intermittent doses of  prednisone  but none recently.  He uses chronic inhaled corticosteroid therapy  The patient is a smoker and currently smokes close to 1 pack of cigarettes per day since the age of 23 He quit briefly when he had heart attack in 2008  MEDICAL HISTORY:  Past Medical History:  Diagnosis  Date   Abnormal myocardial perfusion study 01/01/2011   there a small to moderate sized inferobasal scar   Barrett's esophagus    Cataracts, bilateral    Chronic kidney disease    hx of kidney stones   Claudication (HCC) 11/16/2011   PV test perform shows normal   COPD (chronic obstructive pulmonary disease) (HCC)    Depression    Diabetes (HCC)    type 2 diabetes mellitus   Dysrhythmia    GERD (gastroesophageal reflux disease)    Glaucoma    Hernia of abdominal wall    History of radiation therapy    Right lung, SBRT- 04/16/21-04/30/21- Dr. Lynwood Nasuti   HTN (hypertension)    Hyperlipidemia    Morbid obesity (HCC)    Myocardial infarction (HCC) 2008,2009,2009   OSA (obstructive sleep apnea)    on cpap   PAF (paroxysmal atrial fibrillation) (HCC)    S/P colonoscopy 2009   3-4 mm transverse colon erosions likely secondary to  ASA   S/P endoscopy 10/2010   moderate erosive gastritis, Barrett's esophagus 1-2cm   Sleep apnea    SOB (shortness of breath) 11/03/2007   2D Echo EF 50%-55%   Stroke (HCC) 2017    SURGICAL HISTORY: Past Surgical History:  Procedure Laterality Date   BIOPSY N/A 04/29/2015   Procedure: BIOPSY;  Surgeon: Margo LITTIE Haddock, MD;  Location: [facility];  Service: Endoscopy;  Laterality: N/A;   BIOPSY  08/01/2018   Procedure: BIOPSY;  Surgeon: Haddock Margo LITTIE, MD;  [facility]: [facility];  Service: Endoscopy;;  esophageal   BIOPSY  10/12/2021   Procedure: BIOPSY;  Surgeon: Cindie Carlin POUR, DO;  [facility]: [facility];  Service: Endoscopy;;   BIOPSY  05/09/2023   Procedure: BIOPSY;  Surgeon: Cindie Carlin POUR, DO;  [facility];  Service: Endoscopy;;   CABG X 4  03/2008   CARDIAC CATHETERIZATION  2009   stent placement to the left circumflex a 2.25    CARDIAC CATHETERIZATION  07/08/2010   CARDIAC CATHETERIZATION N/A 11/02/2019   COLONOSCOPY WITH PROPOFOL  N/A 09/27/2017   normal ileum, twenty 4 to 8 mm polyps in the sigmoid colon, descending  colon, splenic flexure, transverse colon, ascending colon, cecum.  An additional three 2 to 4 mm polyps in the rectum and the descending colon.  Diverticulosis, internal hemorrhoids.  Surgical pathology found the polyps to be one fragment of hyperplastic polyp and 22 fragments of tubular adenoma..  Recommended repeat colonoscop   COLONOSCOPY WITH PROPOFOL  N/A 10/12/2021   Procedure: COLONOSCOPY WITH PROPOFOL ;  Surgeon: Cindie Carlin POUR, DO;  [facility];  Service: Endoscopy;  Laterality: N/A;  1:30pm   CORONARY ARTERY BYPASS GRAFT  2008   4 vessels   CORONARY STENT PLACEMENT  12/29/12   CORONARY STENT PLACEMENT  12/2012   ESOPHAGEAL BRUSHING  05/09/2023   Procedure: ESOPHAGEAL BRUSHING;  Surgeon: Cindie Carlin POUR, DO;  [facility];  Service: Endoscopy;;   ESOPHAGEAL DILATION N/A 04/29/2015   Procedure: ESOPHAGEAL DILATION 15 mm, 16 mm;  Surgeon: Margo LITTIE Haddock, MD;  Location: [facility];  Service: Endoscopy;  Laterality: N/A;   ESOPHAGOGASTRODUODENOSCOPY  09/2011   Barrett's  esophagus, no dysplasia on biopsies. Distal esophagitis. Status post dilation. Moderate gastritis and duodenitis, but biopsies benign. Next EGD in November 2015 for surveillance of Barrett's esophagus.   ESOPHAGOGASTRODUODENOSCOPY (EGD) WITH PROPOFOL  N/A 04/29/2015   SLF: 1. Barretts esophagus 2. Moderate non-erosive gastritis.    ESOPHAGOGASTRODUODENOSCOPY (EGD) WITH PROPOFOL  N/A 08/01/2018   Barrett's, repeat in 5 years. Empiric dilatation, mild gastritis   ESOPHAGOGASTRODUODENOSCOPY (EGD) WITH PROPOFOL  N/A 05/09/2023   Procedure: ESOPHAGOGASTRODUODENOSCOPY (EGD) WITH PROPOFOL ;  Surgeon: Cindie Carlin POUR, DO;  [facility];  Service: Endoscopy;  Laterality: N/A;  730am, asa 4   HERNIA REPAIR     ventral hernia repair   LEFT HEART CATHETERIZATION WITH CORONARY/GRAFT ANGIOGRAM N/A 12/29/2012   Procedure: LEFT HEART CATHETERIZATION WITH EL BILE;  Surgeon: Debby DELENA Sor, MD;   Location[facility];  Service: Cardiovascular;  Laterality: N/A;   ORIF FIBULA FRACTURE Right 06/03/2015   Procedure: OPEN REDUCTION INTERNAL FIXATION (ORIF) DISTAL FIBULA  FRACTURE;  Surgeon: Maude LELON Right, MD;  Location: [facility];  Service: Orthopedics;  Laterality: Right;   PERCUTANEOUS CORONARY STENT INTERVENTION (PCI-S)  12/29/2012   Procedure: PERCUTANEOUS CORONARY STENT INTERVENTION (PCI-S);  Surgeon: Debby DELENA Sor, MD;  Location: [facility] CATH LAB;  Service: Cardiovascular;;   POLYPECTOMY  09/27/2017   Procedure: POLYPECTOMY;  Surgeon: Harvey Margo CROME, MD;  [facility];  Service: Endoscopy;;  cecal polyp, ascending polyps x6, transverse colon polyps x6, splenic flexure polyps x2, descending colon polyps x6, sigmoid  colon polyp x1, rectal polyp x1    POLYPECTOMY  10/12/2021   Procedure: POLYPECTOMY;  Surgeon: Cindie Carlin POUR, DO;  [facility];  Service: Endoscopy;;   SAVORY DILATION  09/06/2011   Procedure: SAVORY DILATION;  Surgeon: Margo CHRISTELLA Harvey, MD;  Location: [facility];  Service: Endoscopy;  Laterality: N/A;  Dilated with 15mm   SAVORY DILATION N/A 08/01/2018   Procedure: SAVORY DILATION;  Surgeon: Harvey Margo CROME, MD;  [facility];  Service: Endoscopy;  Laterality: N/A;    SOCIAL HISTORY: Social History   Socioeconomic History   Marital status: Divorced    Spouse name: Not on file   Number of children: Not on file   Years of education: Not on file   Highest education level: Not on file  Occupational History   Not on file  Tobacco Use   Smoking status: Every Day    Current packs/day: 0.50    Average packs/day: 0.5 packs/day for 30.0 years (15.0 ttl pk-yrs)    Types: Cigarettes   Smokeless tobacco: Never   Tobacco comments:    Smokes 0.25 packs per day [facility], 12/29/22  Vaping Use   Vaping status: Never Used  Substance and Sexual Activity   Alcohol use: Yes    Comment: occ beer   Drug use: No   Sexual activity: Never  Other Topics Concern    Not on file  Social History Narrative   Not on file   Social Drivers of Health   Financial Resource Strain: Low Risk  (03/22/2023)   Received from [facility]   Overall Financial Resource Strain (CARDIA)    Difficulty of Paying Living Expenses: Not very hard  Food Insecurity: Food Insecurity Present (06/11/2024)   Hunger Vital Sign    Worried About Running Out of Food in the Last Year: Often true    Ran Out of Food in the Last Year: Often true  Transportation Needs: No Transportation Needs (06/11/2024)   PRAPARE - Transportation  Lack of Transportation (Medical): No    Lack of Transportation (Non-Medical): No  Physical Activity: Not on file  Stress: Not on file  Social Connections: Unknown (03/03/2022)   Received from [facility]   Social Network    Social Network: Not on file  Intimate Partner Violence: Not At Risk (06/11/2024)   Humiliation, Afraid, Rape, and Kick questionnaire    Fear of Current or Ex-Partner: No    Emotionally Abused: No    Physically Abused: No    Sexually Abused: No    FAMILY HISTORY: Family History  Problem Relation Age of Onset   Diabetes Mother    Heart disease Mother    Diabetes Father    Heart disease Father    Pancreatic cancer Brother        age 21, doing well   Colon cancer Neg Hx    Anesthesia problems Neg Hx    Hypotension Neg Hx    Malignant hyperthermia Neg Hx    Pseudochol deficiency Neg Hx     ALLERGIES:  is allergic to contrast media [iodinated contrast media] and iohexol.  MEDICATIONS:  Current Outpatient Medications  Medication Sig Dispense Refill   acetaminophen  (TYLENOL ) 500 MG tablet Take 500 mg by mouth every 6 (six) hours as needed for moderate pain.     albuterol  (VENTOLIN  HFA) 108 (90 Base) MCG/ACT inhaler Inhale 1-2 puffs into the lungs every 4 (four) hours as needed for wheezing or shortness of breath. 18 g 11   apixaban  (ELIQUIS ) 5 MG TABS tablet Take 1 tablet (5 mg total) by mouth 2 (two) times daily. 60  tablet 1   bisoprolol (ZEBETA) 5 MG tablet Take 5 mg by mouth daily.     blood glucose meter kit and supplies Dispense based on patient and insurance preference. Use up to two times daily as directed. (FOR ICD-E11.65) 1 each 5   Blood Glucose Monitoring Suppl (ONETOUCH VERIO) w/Device KIT 1 each by Does not apply route as needed. 1 kit 0   clonazePAM (KLONOPIN) 1 MG tablet Take 1 mg by mouth 2 (two) times daily as needed.     Continuous Glucose Receiver (FREESTYLE LIBRE 3 READER) DEVI 1 Piece by Does not apply route once as needed for up to 1 dose. 1 each 0   Continuous Glucose Sensor (FREESTYLE LIBRE 3 PLUS SENSOR) MISC Change sensor every 15 days. 2 each 2   Cyanocobalamin (VITAMIN B-12 IJ) Inject 1,000 mcg as directed every 30 (thirty) days.     diclofenac Sodium (VOLTAREN) 1 % GEL Apply 1 Application topically 4 (four) times daily as needed (pain).     Dulaglutide  (TRULICITY ) 4.5 MG/0.5ML SOAJ Inject 4.5 mg as directed once a week. 6 mL 1   empagliflozin  (JARDIANCE ) 25 MG TABS tablet Take 1 tablet (25 mg total) by mouth daily before breakfast. 90 tablet 1   escitalopram  (LEXAPRO ) 20 MG tablet Take 20 mg by mouth in the morning.     fenofibrate  (TRICOR ) 145 MG tablet Take 145 mg by mouth in the morning.     furosemide  (LASIX ) 40 MG tablet Take 40 mg by mouth daily.     gabapentin  (NEURONTIN ) 300 MG capsule Take 300 mg by mouth 3 (three) times daily.     hydrocortisone  (ANUSOL -HC) 2.5 % rectal cream Place 1 Application rectally 2 (two) times daily. 30 g 1   icosapent  Ethyl (VASCEPA ) 1 g capsule Take 1 capsule (1 g total) by mouth 2 (two) times daily with a meal. 120 capsule  3   Insulin  Pen Needle (B-D ULTRAFINE III SHORT PEN) 31G X 8 MM MISC Use as directed three time daily to inject insulin  300 each 1   insulin  regular human CONCENTRATED (HUMULIN  R U-500 KWIKPEN) 500 UNIT/ML KwikPen Inject 30 Units into the skin 3 (three) times daily with meals. Only when blood glucose is above 90 and eating. 12  mL 1   ipratropium-albuterol  (DUONEB) 0.5-2.5 (3) MG/3ML SOLN Take 3 mLs by nebulization every 6 (six) hours as needed. 360 mL 11   isosorbide  mononitrate (IMDUR ) 120 MG 24 hr tablet Take 120 mg by mouth in the morning.     Lancets (ONETOUCH DELICA PLUS LANCET33G) MISC USE TO check blood sugar FOUR TIMES DAILY AS DIRECTED 200 each 1   lidocaine  (LIDODERM ) 5 % Place 1 patch onto the skin daily. Remove & Discard patch within 12 hours or as directed by MD 14 patch 0   linaclotide  (LINZESS ) 145 MCG CAPS capsule Take 1 capsule (145 mcg total) by mouth daily before breakfast. 30 capsule 3   meclizine  (ANTIVERT ) 25 MG tablet Take 25 mg by mouth every 6 (six) hours as needed for dizziness or nausea.     metoprolol  tartrate (LOPRESSOR ) 100 MG tablet Take 1 tablet (100 mg total) by mouth 2 (two) times daily. OFFICE VISIT NEEDED BEFORE ADDITIONAL REFILLS 28 tablet 0   modafinil  (PROVIGIL ) 200 MG tablet Take 200 mg by mouth daily.     mupirocin ointment (BACTROBAN) 2 % Apply 1 Application topically 2 (two) times daily.     ONETOUCH VERIO test strip USE TO check blood sugar FOUR TIMES DAILY AS DIRECTED 200 strip 1   oxyCODONE  (ROXICODONE ) 5 MG immediate release tablet Take 1 tablet (5 mg total) by mouth every 4 (four) hours as needed for severe pain (pain score 7-10). 15 tablet 0   pantoprazole  (PROTONIX ) 40 MG tablet Take 1 tablet (40 mg total) by mouth 2 (two) times daily before a meal. 180 tablet 3   polyethylene glycol powder (GLYCOLAX /MIRALAX ) 17 GM/SCOOP powder Take 17 g once daily in 8 ounces of water . 238 g 0   potassium chloride  SA (KLOR-CON  M) 20 MEQ tablet Take 1 tablet (20 mEq total) by mouth daily. While Taking Lasix /Furosemide  (Patient taking differently: Take 20 mEq by mouth 2 (two) times daily.) 30 tablet 0   rosuvastatin  (CRESTOR ) 20 MG tablet Take 20 mg by mouth in the morning.     TRELEGY ELLIPTA  100-62.5-25 MCG/ACT AEPB INHALE 1 PUFF ONCE DAILY 120 each 0   valsartan  (DIOVAN ) 40 MG tablet  Take 1 tablet (40 mg total) by mouth daily. 90 tablet 1   nitroGLYCERIN  (NITROLINGUAL ) 0.4 MG/SPRAY spray Place 1 spray under the tongue every 5 (five) minutes x 3 doses as needed for chest pain. 12 g 1   No current facility-administered medications for this visit.    REVIEW OF SYSTEMS:  All other systems were reviewed with the patient and are negative.  PHYSICAL EXAMINATION: ECOG PERFORMANCE STATUS: 2 - Symptomatic, <50% confined to bed  Vitals:   06/11/24 1258  BP: 96/72  Pulse: 82  Resp: 20  Temp: 99 F (37.2 C)  SpO2: 95%   Filed Weights   06/11/24 1258  Weight: 285 lb 7.9 oz (129.5 kg)    GENERAL:alert, no distress and comfortable.  The patient appears obese/cushingoid SKIN: skin color, texture, turgor are normal, no rashes or significant lesions EYES: normal, conjunctiva are pink and non-injected, sclera clear OROPHARYNX:no exudate, no erythema and lips, buccal  mucosa, and tongue normal  NECK: supple, thyroid  normal size, non-tender, without nodularity LYMPH:  no palpable lymphadenopathy in the cervical, axillary or inguinal LUNGS: Normal breathing effort.  Bilateral scattered expiratory wheezes HEART: regular rate & rhythm and no murmurs and no lower extremity edema ABDOMEN:abdomen soft, non-tender and normal bowel sounds Musculoskeletal:no cyanosis of digits and no clubbing  PSYCH: alert & oriented x 3 with fluent speech NEURO: no focal motor/sensory deficits  LABORATORY DATA:  I have reviewed the data as listed Recent Results (from the past 2160 hours)  Lab report - scanned     Status: None   Collection Time: 05/26/24  4:36 PM  Result Value Ref Range   PSA, Total 0.7     Comment: ABS BY HIM   A1c 6.9    Creatinine, POC 115.5 mg/dL   Albumin, Urine POC 4.3    Microalb Creat Ratio 4    EGFR 67.0    TSH 1.67 0.41 - 5.90   Free T4 1.32 ng/dL  CBC with Differential     Status: Abnormal   Collection Time: 06/04/24  3:51 PM  Result Value Ref Range   WBC 11.1  (H) 4.0 - 10.5 K/uL   RBC 5.67 4.22 - 5.81 MIL/uL   Hemoglobin 18.1 (H) 13.0 - 17.0 g/dL   HCT 45.8 (H) 60.9 - 47.9 %   MCV 95.4 80.0 - 100.0 fL   MCH 31.9 26.0 - 34.0 pg   MCHC 33.5 30.0 - 36.0 g/dL   RDW 87.2 88.4 - 84.4 %   Platelets 299 150 - 400 K/uL   nRBC 0.0 0.0 - 0.2 %   Neutrophils Relative % 58 %   Neutro Abs 6.5 1.7 - 7.7 K/uL   Lymphocytes Relative 27 %   Lymphs Abs 3.0 0.7 - 4.0 K/uL   Monocytes Relative 7 %   Monocytes Absolute 0.7 0.1 - 1.0 K/uL   Eosinophils Relative 6 %   Eosinophils Absolute 0.6 (H) 0.0 - 0.5 K/uL   Basophils Relative 1 %   Basophils Absolute 0.2 (H) 0.0 - 0.1 K/uL   Immature Granulocytes 1 %   Abs Immature Granulocytes 0.05 0.00 - 0.07 K/uL    Comment: Performed at [facility], [address], [place], [place] [address]  Brain natriuretic peptide     Status: None   Collection Time: 06/04/24  3:51 PM  Result Value Ref Range   B Natriuretic Peptide 36.0 0.0 - 100.0 pg/mL    Comment: Performed at [facility], [address], [place], [place] [address]  Urinalysis, Routine w reflex microscopic -Urine, Clean Catch     Status: Abnormal   Collection Time: 06/04/24  4:05 PM  Result Value Ref Range   Color, Urine YELLOW YELLOW   APPearance CLEAR CLEAR   Specific Gravity, Urine 1.020 1.005 - 1.030   pH 5.0 5.0 - 8.0   Glucose, UA >=500 (A) NEGATIVE mg/dL   Hgb urine dipstick NEGATIVE NEGATIVE   Bilirubin Urine NEGATIVE NEGATIVE   Ketones, ur NEGATIVE NEGATIVE mg/dL   Protein, ur NEGATIVE NEGATIVE mg/dL   Nitrite NEGATIVE NEGATIVE   Leukocytes,Ua NEGATIVE NEGATIVE   RBC / HPF 0-5 0 - 5 RBC/hpf   WBC, UA 0-5 0 - 5 WBC/hpf   Bacteria, UA NONE SEEN NONE SEEN   Squamous Epithelial / HPF 0-5 0 - 5 /HPF    Comment: Performed at [facility], [address], [place], [place] [address]  Urine Culture     Status: None  Collection Time: 06/04/24  4:05 PM   Specimen: Urine, Clean Catch  Result Value Ref Range   Specimen Description      URINE, CLEAN  CATCH Performed at [facility], [address], [place], [place] [address]    Special Requests      NONE Performed at [facility], [address] [place], [place] [address]    Culture      NO GROWTH Performed at [facility], [address] [place], [place] [address]    Report Status 06/05/2024 FINAL     RADIOGRAPHIC STUDIES: I have personally reviewed the radiological images as listed and agreed with the findings in the report. CT Renal Stone Study Result Date: 06/04/2024 CLINICAL DATA:  L flank pain. EXAM: CT ABDOMEN AND PELVIS WITHOUT CONTRAST TECHNIQUE: Multidetector CT imaging of the abdomen and pelvis was performed following the standard protocol without IV contrast. RADIATION DOSE REDUCTION: This exam was performed according to the departmental dose-optimization program which includes automated exposure control, adjustment of the mA and/or kV according to patient size and/or use of iterative reconstruction technique. COMPARISON:  CT scan abdomen and pelvis from 08/29/2023. FINDINGS: Lower chest:z there is a 4 x 6 mm solid noncalcified nodule in the middle lobe abutting the pleura (series 4, image 14), which is unchanged since the prior study from 06/11/2020 and favored benign in etiology. There are subpleural atelectatic changes in the visualized lung bases. No overt consolidation. No pleural effusion. The heart is normal in size. No pericardial effusion. Hepatobiliary: The liver is normal in size. Non-cirrhotic configuration. No suspicious mass. No intrahepatic or extrahepatic bile duct dilation. No calcified gallstones. Normal gallbladder wall thickness. No pericholecystic inflammatory changes. Pancreas: Unremarkable. No pancreatic ductal dilatation or surrounding inflammatory changes. Spleen: Within normal limits. No focal lesion. Adrenals/Urinary Tract: Adrenal glands are unremarkable. No suspicious renal mass within the limitations of this unenhanced exam. There is a nearly  completely exophytic 1.3 x 1.5 cm structure arising from the right kidney lower pole, inferiorly, which can be characterized as a cyst. No nephroureterolithiasis or obstructive uropathy. Bilateral scattered vascular calcifications noted. No obstructive uropathy on either side. Unremarkable urinary bladder. Stomach/Bowel: No disproportionate dilation of the small or large bowel loops. No evidence of abnormal bowel wall thickening or inflammatory changes. The appendix is unremarkable. Vascular/Lymphatic: No ascites or pneumoperitoneum. No abdominal or pelvic lymphadenopathy, by size criteria. No aneurysmal dilation of the major abdominal arteries. There are mild peripheral atherosclerotic vascular calcifications of the aorta and its major branches. Reproductive: Normal size prostate. Symmetric seminal vesicles. Other: There are bilateral small fat containing inguinal hernias, tiny fat containing umbilical hernia and small, epigastric, 2 adjacent, right paramedian fat containing ventral hernias. The soft tissues and abdominal wall are otherwise unremarkable. Musculoskeletal: No suspicious osseous lesions. There are mild - moderate multilevel degenerative changes in the visualized spine. There is undisplaced fracture of the posterior aspect of the left eleventh rib. No callus formation seen. IMPRESSION: 1. No nephroureterolithiasis or obstructive uropathy. 2. No acute inflammatory process identified within the abdomen or pelvis. 3. Acute/early subacute, undisplaced fracture of left eleventh rib. Correlate clinically. 4. Multiple other nonacute observations, as described above. Aortic Atherosclerosis (ICD10-I70.0). Electronically Signed   By: Ree Molt M.D.   On: 06/04/2024 17:15

## 2024-06-11 NOTE — Assessment & Plan Note (Signed)
 The cause of his chronic leukocytosis is due to smoking, chronic COPD, inhaled corticosteroids and class III obesity He does not need further workup or follow-up We discussed importance of cigarette smoking cessation

## 2024-06-11 NOTE — Assessment & Plan Note (Signed)
 The patient was noted to have enlarging lung nodule status post SBRT in 2022 He is scheduled for CT imaging in September We discussed importance of regular follow-up with imaging

## 2024-06-11 NOTE — Assessment & Plan Note (Signed)
 The patient appears to have active COPD with scattered wheezes and mild productive cough with white phlegm He will continue to use his inhalers and we discussed importance of smoking cessation

## 2024-06-12 ENCOUNTER — Telehealth: Payer: Self-pay | Admitting: Licensed Clinical Social Worker

## 2024-06-12 ENCOUNTER — Inpatient Hospital Stay: Admitting: Licensed Clinical Social Worker

## 2024-06-12 NOTE — Telephone Encounter (Signed)
 [facility] Clinical Social Work  Clinical Social Work was referred by medical provider for need for community resources.  Clinical Social Worker attempted to contact patient by phone to offer support and assess for needs.   No answer. Left VM with direct contact information.   Follow Up Plan:  CSW will attempt another contact by phone    Barba Solt FORBES IDE, LCSW  Clinical Social Worker [facility]

## 2024-06-12 NOTE — Progress Notes (Signed)
 [facility] Clinical Social Work  Initial Assessment   [name] is a 63 y.o. year old male contacted by phone. Clinical Social Work was referred by [facility] screen protocol for need for community resources.   SDOH (Social Determinants of Health) assessments performed: Yes SDOH Interventions    Flowsheet Row Clinical Support from 06/12/2024 in [facility] - A Dept Of [place]. [facility] Office Visit from 06/11/2024 in [facility] Zelda [facility] - A Dept Of [place]. [facility] ED to Hosp-Admission (Discharged) from 10/12/2022 in [facility] SURGICAL UNIT Telephone from 08/23/2022 in [facility] Telephone from 08/20/2022 in [facility] Community Care Coordination  SDOH Interventions       Food Insecurity Interventions Community Resources Provided AMB Referral -- [id number] Referral Other (Comment)  [Community resource care guide referral]  Housing Interventions -- Intervention Not Indicated Intervention Not Indicated -- Intervention Not Indicated  Transportation Interventions -- Intervention Not Indicated -- -- Intervention Not Indicated  Utilities Interventions -- Intervention Not Indicated -- -- Intervention Not Indicated  Financial Strain Interventions -- -- -- [id number] Referral --    SDOH Screenings   Food Insecurity: Food Insecurity Present (06/11/2024)  Housing: Low Risk  (06/11/2024)  Transportation Needs: No Transportation Needs (06/11/2024)  Utilities: Not At Risk (06/11/2024)  Depression (PHQ2-9): High Risk (06/11/2024)  Financial Resource Strain: Low Risk  (03/22/2023)   Received from [facility]  Social Connections: Unknown (03/03/2022)   Received from [facility]  Tobacco Use: High Risk (06/11/2024)     Distress Screen completed: No   Family/Social Information:  Housing Arrangement: patient lives with his stepson Family members/support persons in your life? Family and  Friends Transportation concerns: no  Employment: Legally disabled.  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, Food, and Loss of sense of purposesince unable to work like he always had before 2008 Current coping skills/ strengths: Capable of independent living  and Supportive family/friends     SUMMARY: Current SDOH Barriers:  Limited access to food and Mental Health Concerns   Clinical Social Work Clinical Goal(s):  Explore community resource options for unmet needs related to:  Corporate treasurer  and Food Insecurity   Interventions: Provided CSW contact information and encouraged patient to call with any questions or concerns Provided patient with information about local food pantries and information on LIEAP through social services  Explored depression concern and offered referral for counseling. Pt declined. Denies SI   Follow Up Plan: Patient will contact CSW with any support or resource needs Patient verbalizes understanding of plan: Yes    Zaleah Ternes E Anthon Harpole, LCSW Clinical Social Worker [facility]

## 2024-06-13 ENCOUNTER — Other Ambulatory Visit: Payer: Self-pay | Admitting: Internal Medicine

## 2024-06-13 ENCOUNTER — Telehealth: Payer: Self-pay

## 2024-06-13 NOTE — Telephone Encounter (Signed)
 Copied from CRM [id number]. Topic: Clinical - Prescription Issue >> Jun 11, 2024  3:12 PM [name] wrote: Reason for CRM: Patient states that his pharmacy has sent multiple requests for a refill of benzonatate  200mg  capsule for his cough and is requesting [name] to fill this for him. >> Jun 11, 2024  3:25 PM Isabell A wrote: Patient calling back for any updates - advised refills may take 3-5 business days.   Dr Darlean I do not see where you have given this pt tessalon 

## 2024-06-13 NOTE — Telephone Encounter (Signed)
 Copied from CRM [id number]. Topic: Clinical - Prescription Issue >> Jun 11, 2024  3:12 PM Corean SAUNDERS wrote: Reason for CRM: Patient states that his pharmacy has sent multiple requests for a refill of benzonatate  200mg  capsule for his cough and is requesting [name] to fill this for him. >> Jun 11, 2024  3:25 PM Isabell A wrote: Patient calling back for any updates - advised refills may take 3-5 business days.   Dr Darlean I do not see where you have given this pt tessalon 

## 2024-06-14 NOTE — Telephone Encounter (Signed)
 Atc pt x1 to inform him that we will refill the tessalon  one time but that medication if for a cough and we need to find the root cause so he needs to be sure to keep his coming up appt.

## 2024-06-14 NOTE — Telephone Encounter (Signed)
 Copied from CRM [id number]. Topic: Clinical - Medical Advice >> Jun 14, 2024 10:36 AM Rozanna MATSU wrote: Reason for CRM: PT CALLED BACK ADVISED HIM OF MESSAGE THAT YOU ALL WILL REFILL MED AND HE WILL NEED TO KEEP THE APPT HE HAS FOR DR DARLEAN  Nfn pt has appt

## 2024-06-19 ENCOUNTER — Other Ambulatory Visit: Payer: Self-pay | Admitting: "Endocrinology

## 2024-06-28 ENCOUNTER — Telehealth: Payer: Self-pay | Admitting: "Endocrinology

## 2024-06-28 NOTE — Telephone Encounter (Signed)
 Spoke with pt making him aware glucose of 83 was WNL. Advised him to go to Urgent Care or ER to have his foot evaluated asap. Pt voiced understanding.

## 2024-06-28 NOTE — Telephone Encounter (Signed)
 Pt states that he had a reading of 83 , knot come up on big toe and some smaller ones on same foot(left foot), they were black and purple and pulled the skin off of it and now there is an indentation.  He has put peroxide on it and wants to know if this is because of his sugar.  Wants a call back

## 2024-07-04 DIAGNOSIS — J449 Chronic obstructive pulmonary disease, unspecified: Secondary | ICD-10-CM | POA: Diagnosis not present

## 2024-07-05 DIAGNOSIS — E538 Deficiency of other specified B group vitamins: Secondary | ICD-10-CM | POA: Diagnosis not present

## 2024-07-06 ENCOUNTER — Other Ambulatory Visit: Payer: Self-pay | Admitting: Internal Medicine

## 2024-07-06 DIAGNOSIS — S91101D Unspecified open wound of right great toe without damage to nail, subsequent encounter: Secondary | ICD-10-CM | POA: Diagnosis not present

## 2024-07-06 DIAGNOSIS — X58XXXD Exposure to other specified factors, subsequent encounter: Secondary | ICD-10-CM | POA: Diagnosis not present

## 2024-07-06 DIAGNOSIS — R079 Chest pain, unspecified: Secondary | ICD-10-CM | POA: Diagnosis not present

## 2024-07-06 DIAGNOSIS — T1490XD Injury, unspecified, subsequent encounter: Secondary | ICD-10-CM | POA: Diagnosis not present

## 2024-07-06 DIAGNOSIS — F419 Anxiety disorder, unspecified: Secondary | ICD-10-CM | POA: Diagnosis not present

## 2024-07-06 MED ORDER — BENZONATATE 200 MG PO CAPS: 200.0000 mg | ORAL_CAPSULE | Freq: Three times a day (TID) | ORAL | 0 refills | Status: DC | PRN

## 2024-07-06 NOTE — Telephone Encounter (Signed)
 Copied from CRM [id number]. Topic: Clinical - Medication Refill >> Jul 06, 2024  1:15 PM Isabell A wrote: Medication: benzonatate  (TESSALON ) 200 MG capsule  Has the patient contacted their pharmacy? Yes (Agent: If no, request that the patient contact the pharmacy for the refill. If patient does not wish to contact the pharmacy document the reason why and proceed with request.) (Agent: If yes, when and what did the pharmacy advise?)  This is the patient's preferred pharmacy:  [facility] [place], [place] - [id number] [place] [address] Phone: [phone] Fax: [phone]  Is this the correct pharmacy for this prescription? Yes If no, delete pharmacy and type the correct one.   Has the prescription been filled recently? Yes  Is the patient out of the medication? Yes  Has the patient been seen for an appointment in the last year OR does the patient have an upcoming appointment? Yes  Can we respond through MyChart? No  Agent: Please be advised that Rx refills may take up to 3 business days. We ask that you follow-up with your pharmacy.

## 2024-07-17 ENCOUNTER — Ambulatory Visit: Admitting: Internal Medicine

## 2024-07-18 ENCOUNTER — Ambulatory Visit (HOSPITAL_COMMUNITY)
Admission: RE | Admit: 2024-07-18 | Discharge: 2024-07-18 | Disposition: A | Discharge status: Home / Self Care | Source: Ambulatory Visit | Attending: Adult Health | Admitting: Adult Health

## 2024-07-18 DIAGNOSIS — R918 Other nonspecific abnormal finding of lung field: Secondary | ICD-10-CM | POA: Insufficient documentation

## 2024-07-18 DIAGNOSIS — J8410 Pulmonary fibrosis, unspecified: Secondary | ICD-10-CM | POA: Diagnosis not present

## 2024-07-19 ENCOUNTER — Ambulatory Visit: Payer: Self-pay | Admitting: Adult Health

## 2024-07-19 DIAGNOSIS — R918 Other nonspecific abnormal finding of lung field: Secondary | ICD-10-CM

## 2024-07-19 DIAGNOSIS — C3411 Malignant neoplasm of upper lobe, right bronchus or lung: Secondary | ICD-10-CM

## 2024-07-22 NOTE — Progress Notes (Unsigned)
 [name] Server, male    DOB: 1960/12/01    MRN: [id number]   Brief patient profile:  89  yowm  active smoker/former [facility] pt  referred to pulmonary clinic in [place]  10/03/2023 by Dr Marvine  for f/u chronic R Chest    PFT's  03/20/20  FEV1 2.4 (54 % ) ratio 0.74  p 7 % improvement from saba p nothing  prior to study  and FV curve min concavity  and ERV 25% at wt 303      History of Present Illness  10/03/2023  Pulmonary/ 1st office eval/ Dain Laseter / [place] [facility]  Chief Complaint  Patient presents with   Shortness of Breath   COPD  Dyspnea:  rides scooter at [employer] / 75 ft  Cough: not much  Sleep: on cpap / 02 and flat bed / 3 pillows  SABA use: 3 x daily hfa x 2 puffs each / neb 2 twice sometimes at rest  02: does not know settings  Has chronic/ recurrent ant R cp x years, sev times per month lasting sev minutes s ex or pleuritic features or other aggravating or alleviating qualities.  Rec Make sure you check your oxygen  saturation  AT  your highest level of activity (not after you stop)   to be sure it stays over 90%  Stop lisinopril  and start valsartan  80 mg daily  Please schedule a follow up office visit in 6 weeks, call sooner if needed with all medications /inhalers/ oxygen  solutions in hand   11/14/2023  f/u ov/[place] office/Agapito Hanway re: MO with PRISM   maint on trelegy   did not  bring meds / still smoking  Chief Complaint  Patient presents with   Shortness of Breath  Dyspnea:  no change = 75 ft  Cough: harsh/ slt rattle upper airway pattern  Sleeping: flat bed 3 pillows  plugs in 02 to  cpap at 3.5  SABA use: 2 x daily plus prn duoneb  02: 3lpm at rest but came here today s 02  Rec Stop trelegy  Change duoneb can be taken up to 4 x daily - especially before you go out (lasts about 4 hours )  Instead of metaprolol I strongly  prefer you change to  Bisoprolol adjusted to desirable blood pressure - show this instruction to your heart doctor at [facility]  We will  refer you to [employer] for a better portable system for you and in meantime: Make sure you check your oxygen  saturation  AT  your highest level of activity (not after you stop)   to be sure it stays over 90%  Please schedule a follow up visit in 3 months but call sooner if needed with NP for cpap f/u and wear your portable 02 to office   04/17/2024  f/u ov/ office/Dametra Whetsel re: MO with Prism   maint on trelegy  did bring meds /portable 02  Chief Complaint  Patient presents with   Follow-up    Shob - cough -white mucus    Dyspnea:  no change = 75 ft flat and slow  Cough: just white mucus Sleeping: ok on CAPAP   s resp cc  SABA use: 2 puffs hs / neb each am  02: 4lpm POC   vs Conc set on 3.5 lpm / at hs and daytime at home  Lung cancer screening: in program  Rec Only use your albuterol  as a rescue medication  Also  Ok to try albuterol  15 min before an activity (  on alternating days)  that you know would usually make you short of breath  Follow up with me in 6 m, call sooner if needed  07/18/24  CT chest no interval change s/p RT    07/26/2024  f/u ov/[place] office/Arius Harnois re:  MO and prism / 02 dep  maint on trelegy  / still smoking Chief Complaint  Patient presents with   Shortness of Breath    SOB and dyspnea with exertion   Dyspnea:  improved on trelegy /  Cough: min rattle mucoid in am  Sleeping: bed is flat/ 3 pillows and cpap and 3.5 no change  SABA use: 4 total puffs per day / 2 x neb / not pre-treating.  02: 4lpm POC at ov and not using any at all at rest    No obvious day to day or daytime variability or assoc e  purulent sputum or mucus plugs or hemoptysis or cp or chest tightness, subjective wheeze or overt sinus or hb symptoms.    Also denies any obvious fluctuation of symptoms with weather or environmental changes or other aggravating or alleviating factors except as outlined above   No unusual exposure hx or h/o childhood pna/ asthma or knowledge of premature  birth.  Current Allergies, Complete Past Medical History, Past Surgical History, Family History, and Social History were reviewed in [employer] record.  ROS  The following are not active complaints unless bolded Hoarseness, sore throat, dysphagia, dental problems, itching, sneezing,  nasal congestion or discharge of excess mucus or purulent secretions, ear ache,   fever, chills, sweats, unintended wt loss or wt gain, classically pleuritic or exertional cp,  orthopnea pnd or arm/hand swelling  or leg swelling, presyncope, palpitations, abdominal pain, anorexia, nausea, vomiting, diarrhea  or change in bowel habits or change in bladder habits, change in stools or change in urine, dysuria, hematuria,  rash, arthralgias, visual complaints, headache, numbness, weakness or ataxia or problems with walking or coordination,  change in mood or  memory.        Current Meds  Medication Sig   acetaminophen  (TYLENOL ) 500 MG tablet Take 500 mg by mouth every 6 (six) hours as needed for moderate pain.   albuterol  (VENTOLIN  HFA) 108 (90 Base) MCG/ACT inhaler Inhale 1-2 puffs into the lungs every 4 (four) hours as needed for wheezing or shortness of breath.   apixaban  (ELIQUIS ) 5 MG TABS tablet Take 1 tablet (5 mg total) by mouth 2 (two) times daily.   benzonatate  (TESSALON ) 200 MG capsule Take 1 capsule (200 mg total) by mouth 3 (three) times daily as needed for cough.   bisoprolol (ZEBETA) 5 MG tablet Take 5 mg by mouth daily.   blood glucose meter kit and supplies Dispense based on patient and insurance preference. Use up to two times daily as directed. (FOR ICD-E11.65)   Blood Glucose Monitoring Suppl (ONETOUCH VERIO) w/Device KIT 1 each by Does not apply route as needed.   clonazePAM (KLONOPIN) 1 MG tablet Take 1 mg by mouth 2 (two) times daily as needed.   Continuous Glucose Receiver (FREESTYLE LIBRE 3 READER) DEVI 1 Piece by Does not apply route once as needed for up to 1 dose.    Continuous Glucose Sensor (FREESTYLE LIBRE 3 PLUS SENSOR) MISC Change sensor every 15 days.   Cyanocobalamin  (VITAMIN B-12 IJ) Inject 1,000 mcg as directed every 30 (thirty) days.   diclofenac Sodium (VOLTAREN) 1 % GEL Apply 1 Application topically 4 (four) times daily as needed (pain).  Dulaglutide  (TRULICITY ) 4.5 MG/0.5ML SOAJ Inject 4.5 mg as directed once a week.   empagliflozin  (JARDIANCE ) 25 MG TABS tablet Take 1 tablet (25 mg total) by mouth daily before breakfast.   escitalopram  (LEXAPRO ) 20 MG tablet Take 20 mg by mouth in the morning.   fenofibrate  (TRICOR ) 145 MG tablet Take 145 mg by mouth in the morning.   furosemide  (LASIX ) 40 MG tablet Take 40 mg by mouth daily.   gabapentin  (NEURONTIN ) 300 MG capsule Take 300 mg by mouth 3 (three) times daily.   hydrocortisone  (ANUSOL -HC) 2.5 % rectal cream Place 1 Application rectally 2 (two) times daily.   icosapent  Ethyl (VASCEPA ) 1 g capsule Take 1 capsule (1 g total) by mouth 2 (two) times daily with a meal.   Insulin  Pen Needle (B-D ULTRAFINE III SHORT PEN) 31G X 8 MM MISC Use as directed three time daily to inject insulin    insulin  regular human CONCENTRATED (HUMULIN  R U-500 KWIKPEN) 500 UNIT/ML KwikPen Inject 30 Units into the skin 3 (three) times daily with meals. Only when blood glucose is above 90 and eating.   ipratropium-albuterol  (DUONEB) 0.5-2.5 (3) MG/3ML SOLN Take 3 mLs by nebulization every 6 (six) hours as needed.   isosorbide  mononitrate (IMDUR ) 120 MG 24 hr tablet Take 120 mg by mouth in the morning.   Lancets (ONETOUCH DELICA PLUS LANCET33G) MISC USE TO check blood sugar FOUR TIMES DAILY AS DIRECTED   lidocaine  (LIDODERM ) 5 % Place 1 patch onto the skin daily. Remove & Discard patch within 12 hours or as directed by MD   linaclotide  (LINZESS ) 145 MCG CAPS capsule Take 1 capsule (145 mcg total) by mouth daily before breakfast.   meclizine  (ANTIVERT ) 25 MG tablet Take 25 mg by mouth every 6 (six) hours as needed for dizziness or  nausea.   mupirocin ointment (BACTROBAN) 2 % Apply 1 Application topically 2 (two) times daily.   nitroGLYCERIN  (NITROLINGUAL ) 0.4 MG/SPRAY spray Place 1 spray under the tongue every 5 (five) minutes x 3 doses as needed for chest pain.   ONETOUCH VERIO test strip USE TO check blood sugar FOUR TIMES DAILY AS DIRECTED   oxyCODONE  (ROXICODONE ) 5 MG immediate release tablet Take 1 tablet (5 mg total) by mouth every 4 (four) hours as needed for severe pain (pain score 7-10).   pantoprazole  (PROTONIX ) 40 MG tablet Take 1 tablet (40 mg total) by mouth 2 (two) times daily before a meal.   polyethylene glycol powder (GLYCOLAX /MIRALAX ) 17 GM/SCOOP powder Take 17 g once daily in 8 ounces of water .   potassium chloride  SA (KLOR-CON  M) 20 MEQ tablet Take 1 tablet (20 mEq total) by mouth daily. While Taking Lasix /Furosemide  (Patient taking differently: Take 20 mEq by mouth 2 (two) times daily.)   rosuvastatin  (CRESTOR ) 20 MG tablet Take 20 mg by mouth in the morning.   TRELEGY ELLIPTA  100-62.5-25 MCG/ACT AEPB Inhale 1 puff by mouth once daily   valsartan  (DIOVAN ) 40 MG tablet Take 1 tablet (40 mg total) by mouth daily.            Past Medical History:  Diagnosis Date   Abnormal myocardial perfusion study 01/01/2011   there a small to moderate sized inferobasal scar   Barrett's esophagus    Cataracts, bilateral    Chronic kidney disease    hx of kidney stones   Claudication (HCC) 11/16/2011   PV test perform shows normal   COPD (chronic obstructive pulmonary disease) (HCC)    Depression    Diabetes (HCC)  type 2 diabetes mellitus   Dysrhythmia    GERD (gastroesophageal reflux disease)    Glaucoma    Hernia of abdominal wall    History of radiation therapy    Right lung, SBRT- 04/16/21-04/30/21- Dr. Lynwood Nasuti   HTN (hypertension)    Hyperlipidemia    Morbid obesity (HCC)    Myocardial infarction (HCC) 2008,2009,2009   OSA (obstructive sleep apnea)    on cpap   PAF (paroxysmal atrial  fibrillation) (HCC)    S/P colonoscopy 2009   3-4 mm transverse colon erosions likely secondary to  ASA   S/P endoscopy 10/2010   moderate erosive gastritis, Barrett's esophagus 1-2cm   Sleep apnea    SOB (shortness of breath) 11/03/2007   2D Echo EF 50%-55%   Stroke (HCC) 2017      Objective:    Wts  07/26/2024          290   04/17/2024           295   11/14/23 (!) 308 lb (139.7 kg)  10/31/23 (!) 311 lb 11.2 oz (141.4 kg)  10/04/23 (!) 303 lb 9.6 oz (137.7 kg)    Vital signs reviewed  07/26/2024  - Note at rest 02 sats  94% on 4lpm  POC  General appearance:    morbidly obese (by BMI)  amb chronically ill wm slt hoarse   HEENT : Oropharynx  clear      Nasal turbinates nl    NECK :  without  apparent JVD/ palpable Nodes/TM    LUNGS: no acc muscle use,  Nl contour chest which is clear to A and P bilaterally without cough on insp or exp maneuvers   CV:  RRR  no s3 or murmur or increase in P2, and no edema   ABD:  massively obese but soft and nontender   MS:    ext warm without deformities Or obvious joint restrictions  calf tenderness, cyanosis or clubbing    SKIN: warm and dry without lesions    NEURO:  alert, approp, nl sensorium with  no motor or cerebellar deficits apparent.       Assessment     Assessment & Plan DOE (dyspnea on exertion)  Active smoker  - PFT's  03/20/20  FEV1 2.4 (54 % ) ratio 0.74  p 7 % improvement from saba p nothing  prior to study with  and FV curve min concavity  and ERV 25% at wt 303    - 10/03/2023 d/c acei due to prominent pseudowheeze > no change 11/14/2023 so d/c trelegy and rx duoneb up to qid / continue ppi bid ac - 04/17/2024  After extensive coaching inhaler device,  effectiveness =    75% hfa but over using saba a baseline   Though technically GOLD 0 due to effects of obesity causing PRISM , he is apparently  Group E in terms of symptoms/risk so  laba/lama/ICS  therefore appropriate rx at this point  >>>  continue  Trelegy 100    and more approp SABA prn.  Re SABA :  I spent extra time with pt today reviewing appropriate use of albuterol  for prn use on exertion with the following points: 1) saba is for relief of sob that does not improve by walking a slower pace or resting but rather if the pt does not improve after trying this first. 2) If the pt is convinced, as many are, that saba helps recover from activity faster then it's easy to tell  if this is the case by re-challenging : ie stop, take the inhaler, then p 5 minutes try the exact same activity (intensity of workload) that just caused the symptoms and see if they are substantially diminished or not after saba 3) if there is an activity that reproducibly causes the symptoms, try the saba 15 min before the activity on alternate days   If in fact the saba really does help, then fine to continue to use it prn but advised may need to look closer at the maintenance regimen being used to achieve better control of airways disease with exertion.    Chronic respiratory failure with hypoxia (HCC) HC03  08/09/23   = 31  HC03   07/24/24  = 21   Using 3.5 lpm with cpap, none at rest, and up to 4lpm POC with activity  Advised: Make sure you check your oxygen  saturation  AT  your highest level of activity (not after you stop)   to be sure it stays over 90% and adjust  02 flow upward to maintain this level if needed but remember to turn it back to previous settings when you stop (to conserve your supply).  Morbid obesity (HCC) PFT's 03/20/20 ERV 25% at wt 303   Body mass index is 40.47 kg/m.  -  trending down slightly/ encouraged to keep working on it  Lab Results  Component Value Date   TSH 1.67 05/26/2024   Contributing to doe and risk of GERD/dvt/ PE  >>>   reviewed the need and the process to achieve and maintain neg calorie balance > defer f/u primary care including intermittently monitoring thyroid  status    Current smoker 4  min discussion re active cigarette smoking in  addition to office E&M  Ask about tobacco use:   ongoing Advise quitting   .I took an extended  opportunity with this patient to outline the consequences of continued cigarette use  in airway disorders based on all the data we have from the multiple national lung health studies (perfomed over decades at millions of dollars in cost)  indicating that smoking cessation, not choice of inhalers or pulmonary physicians, is the most important aspect of his  care.   Assess willingness:  Not committed at this point Assist in quit attempt:  Per PCP when ready Arrange follow up:   Follow up per Primary Care planned           Each maintenance medication was reviewed in detail including emphasizing most importantly the difference between maintenance and prns and under what circumstances the prns are to be triggered using an action plan format where appropriate.  Total time for H and P, chart review, counseling, reviewing dpi, hfa/neb/ 02 / pulse ox  device(s) and generating customized AVS unique to this office visit / same day charting = 21 min              AVS  Patient Instructions  Plan A = Automatic = Always=    Trelegy 100 each am   Plan B = Backup (to supplement plan A, not to replace it) Use your albuterol  inhaler as a rescue medication to be used if you can't catch your breath by resting or slowing your pace  or doing a relaxed purse lip breathing pattern.  - The less you use it, the better it will work when you need it. - Ok to use the inhaler up to 2 puffs  every 4 hours if you must but call for  appointment if use goes up over your usual need - Don't leave home without it !!  (think of it like the spare tire or starter fluid for your car)   Plan C = Crisis (instead of Plan B but only if Plan B stops working) - only use your albuterol  nebulizer if you first try Plan B and it fails to help > ok to use the nebulizer up to every 4 hours but if start needing it regularly call for immediate  appointment    Ok to try albuterol  15 min before an activity (on alternating days)  that you know would usually make you short of breath and see if it makes any difference and if makes none then don't take albuterol  after activity unless you can't catch your breath as this means it's the resting that helps, not the albuterol .   Please schedule a follow up visit in 6 months but call sooner if needed        Ronnie America, MD 07/27/2024

## 2024-07-24 DIAGNOSIS — R079 Chest pain, unspecified: Secondary | ICD-10-CM | POA: Diagnosis not present

## 2024-07-24 DIAGNOSIS — I25810 Atherosclerosis of coronary artery bypass graft(s) without angina pectoris: Secondary | ICD-10-CM | POA: Diagnosis not present

## 2024-07-24 DIAGNOSIS — R0602 Shortness of breath: Secondary | ICD-10-CM | POA: Diagnosis not present

## 2024-07-26 ENCOUNTER — Encounter: Payer: Self-pay | Admitting: Internal Medicine

## 2024-07-26 ENCOUNTER — Ambulatory Visit: Admitting: Internal Medicine

## 2024-07-26 VITALS — BP 99/60 | HR 95 | Temp 98.7°F | Ht 71.0 in | Wt 290.2 lb

## 2024-07-26 DIAGNOSIS — J9611 Chronic respiratory failure with hypoxia: Secondary | ICD-10-CM

## 2024-07-26 DIAGNOSIS — Z6841 Body Mass Index (BMI) 40.0 and over, adult: Secondary | ICD-10-CM | POA: Diagnosis not present

## 2024-07-26 DIAGNOSIS — F17200 Nicotine dependence, unspecified, uncomplicated: Secondary | ICD-10-CM | POA: Diagnosis not present

## 2024-07-26 DIAGNOSIS — R0609 Other forms of dyspnea: Secondary | ICD-10-CM

## 2024-07-26 NOTE — Patient Instructions (Signed)
 Plan A = Automatic = Always=    Trelegy 100 each am   Plan B = Backup (to supplement plan A, not to replace it) Use your albuterol  inhaler as a rescue medication to be used if you can't catch your breath by resting or slowing your pace  or doing a relaxed purse lip breathing pattern.  - The less you use it, the better it will work when you need it. - Ok to use the inhaler up to 2 puffs  every 4 hours if you must but call for appointment if use goes up over your usual need - Don't leave home without it !!  (think of it like the spare tire or starter fluid for your car)   Plan C = Crisis (instead of Plan B but only if Plan B stops working) - only use your albuterol  nebulizer if you first try Plan B and it fails to help > ok to use the nebulizer up to every 4 hours but if start needing it regularly call for immediate appointment    Ok to try albuterol  15 min before an activity (on alternating days)  that you know would usually make you short of breath and see if it makes any difference and if makes none then don't take albuterol  after activity unless you can't catch your breath as this means it's the resting that helps, not the albuterol .   Please schedule a follow up visit in 6 months but call sooner if needed

## 2024-07-27 ENCOUNTER — Encounter: Payer: Self-pay | Admitting: Internal Medicine

## 2024-07-27 NOTE — Assessment & Plan Note (Addendum)
 4  min discussion re active cigarette smoking in addition to office E&M  Ask about tobacco use:   ongoing Advise quitting   .I took an extended  opportunity with this patient to outline the consequences of continued cigarette use  in airway disorders based on all the data we have from the multiple national lung health studies (perfomed over decades at millions of dollars in cost)  indicating that smoking cessation, not choice of inhalers or pulmonary physicians, is the most important aspect of his  care.   Assess willingness:  Not committed at this point Assist in quit attempt:  Per PCP when ready Arrange follow up:   Follow up per Primary Care planned           Each maintenance medication was reviewed in detail including emphasizing most importantly the difference between maintenance and prns and under what circumstances the prns are to be triggered using an action plan format where appropriate.  Total time for H and P, chart review, counseling, reviewing dpi, hfa/neb/ 02 / pulse ox  device(s) and generating customized AVS unique to this office visit / same day charting = 21 min

## 2024-07-27 NOTE — Assessment & Plan Note (Addendum)
 Active smoker  - PFT's  03/20/20  FEV1 2.4 (54 % ) ratio 0.74  p 7 % improvement from saba p nothing  prior to study with  and FV curve min concavity  and ERV 25% at wt 303    - 10/03/2023 d/c acei due to prominent pseudowheeze > no change 11/14/2023 so d/c trelegy and rx duoneb up to qid / continue ppi bid ac - 04/17/2024  After extensive coaching inhaler device,  effectiveness =    75% hfa but over using saba a baseline   Though technically GOLD 0 due to effects of obesity causing PRISM , he is apparently  Group E in terms of symptoms/risk so  laba/lama/ICS  therefore appropriate rx at this point  >>>  continue  Trelegy 100   and more approp SABA prn.  Re SABA :  I spent extra time with pt today reviewing appropriate use of albuterol  for prn use on exertion with the following points: 1) saba is for relief of sob that does not improve by walking a slower pace or resting but rather if the pt does not improve after trying this first. 2) If the pt is convinced, as many are, that saba helps recover from activity faster then it's easy to tell if this is the case by re-challenging : ie stop, take the inhaler, then p 5 minutes try the exact same activity (intensity of workload) that just caused the symptoms and see if they are substantially diminished or not after saba 3) if there is an activity that reproducibly causes the symptoms, try the saba 15 min before the activity on alternate days   If in fact the saba really does help, then fine to continue to use it prn but advised may need to look closer at the maintenance regimen being used to achieve better control of airways disease with exertion.

## 2024-07-27 NOTE — Assessment & Plan Note (Addendum)
 PFT's 03/20/20 ERV 25% at wt 303   Body mass index is 40.47 kg/m.  -  trending down slightly/ encouraged to keep working on it  Lab Results  Component Value Date   TSH 1.67 05/26/2024   Contributing to doe and risk of GERD/dvt/ PE  >>>   reviewed the need and the process to achieve and maintain neg calorie balance > defer f/u primary care including intermittently monitoring thyroid  status

## 2024-07-27 NOTE — Assessment & Plan Note (Addendum)
 HC03  08/09/23   = 31  HC03   07/24/24  = 21   Using 3.5 lpm with cpap, none at rest, and up to 4lpm POC with activity  Advised: Make sure you check your oxygen  saturation  AT  your highest level of activity (not after you stop)   to be sure it stays over 90% and adjust  02 flow upward to maintain this level if needed but remember to turn it back to previous settings when you stop (to conserve your supply).

## 2024-07-31 ENCOUNTER — Telehealth: Payer: Self-pay | Admitting: Internal Medicine

## 2024-07-31 NOTE — Progress Notes (Signed)
 Called and spoke with patient, provided results per Madelin Stank NP.  He verbalized understanding.  He has already had his follow up with [name].  Nothing further needed.

## 2024-07-31 NOTE — Telephone Encounter (Unsigned)
 Copied from CRM #[id number]. Topic: Clinical - Medication Refill >> Jul 31, 2024  3:08 PM Isabell A wrote: Medication: benzonatate  (TESSALON ) 200 MG capsule  Has the patient contacted their pharmacy? Yes (Agent: If no, request that the patient contact the pharmacy for the refill. If patient does not wish to contact the pharmacy document the reason why and proceed with request.) (Agent: If yes, when and what did the pharmacy advise?)  This is the patient's preferred pharmacy:  [facility], [place] - [id number] [place] [address] Phone: [phone] Fax: [phone] Is this the correct pharmacy for this prescription? Yes If no, delete pharmacy and type the correct one.   Has the prescription been filled recently? Yes  Is the patient out of the medication? Yes  Has the patient been seen for an appointment in the last year OR does the patient have an upcoming appointment? Yes  Can we respond through MyChart? No  Agent: Please be advised that Rx refills may take up to 3 business days. We ask that you follow-up with your pharmacy.

## 2024-08-01 MED ORDER — BENZONATATE 200 MG PO CAPS: 200.0000 mg | ORAL_CAPSULE | Freq: Three times a day (TID) | ORAL | 0 refills | Status: DC | PRN

## 2024-08-01 NOTE — Telephone Encounter (Signed)
 Called patient to inform and confirm pharmacy verbalized understanding.NFN

## 2024-08-03 DIAGNOSIS — J449 Chronic obstructive pulmonary disease, unspecified: Secondary | ICD-10-CM | POA: Diagnosis not present

## 2024-08-10 NOTE — Addendum Note (Signed)
 Addended by: ORLIE MADELIN RAMAN on: 08/10/2024 10:02 PM   Modules accepted: Orders

## 2024-08-17 DIAGNOSIS — G4733 Obstructive sleep apnea (adult) (pediatric): Secondary | ICD-10-CM | POA: Diagnosis not present

## 2024-08-17 DIAGNOSIS — J441 Chronic obstructive pulmonary disease with (acute) exacerbation: Secondary | ICD-10-CM | POA: Diagnosis not present

## 2024-08-17 DIAGNOSIS — J449 Chronic obstructive pulmonary disease, unspecified: Secondary | ICD-10-CM | POA: Diagnosis not present

## 2024-08-21 DIAGNOSIS — R079 Chest pain, unspecified: Secondary | ICD-10-CM | POA: Diagnosis not present

## 2024-08-21 DIAGNOSIS — I25810 Atherosclerosis of coronary artery bypass graft(s) without angina pectoris: Secondary | ICD-10-CM | POA: Diagnosis not present

## 2024-08-27 ENCOUNTER — Other Ambulatory Visit: Payer: Self-pay | Admitting: Internal Medicine

## 2024-08-27 NOTE — Telephone Encounter (Unsigned)
 Copied from CRM [id number]. Topic: Clinical - Medication Refill >> Aug 27, 2024 11:49 AM [name] wrote: Medication: benzonatate  (TESSALON ) 200 MG capsule   Has the patient contacted their pharmacy? Yes (Agent: If no, request that the patient contact the pharmacy for the refill. If patient does not wish to contact the pharmacy document the reason why and proceed with request.) (Agent: If yes, when and what did the pharmacy advise?)  This is the patient's preferred pharmacy:  [facility] [place], [place] - [id number] [place] [address] Phone: [phone] Fax: [phone]  Is this the correct pharmacy for this prescription? Yes If no, delete pharmacy and type the correct one.   Has the prescription been filled recently? Yes  Is the patient out of the medication? No  Has the patient been seen for an appointment in the last year OR does the patient have an upcoming appointment? Yes  Can we respond through MyChart? No  Agent: Please be advised that Rx refills may take up to 3 business days. We ask that you follow-up with your pharmacy.

## 2024-08-28 ENCOUNTER — Other Ambulatory Visit: Payer: Self-pay | Admitting: Internal Medicine

## 2024-08-28 NOTE — Telephone Encounter (Unsigned)
 Copied from CRM #[id number]. Topic: Clinical - Medication Refill >> Aug 28, 2024  3:09 PM Dustin F wrote: Medication: benzonatate  (TESSALON ) 200 MG capsule [[id number]]  Has the patient contacted their pharmacy? Yes; pt was told the pharmacy sent a fax for a refill for this prescription yesterday.  (Agent: If no, request that the patient contact the pharmacy for the refill. If patient does not wish to contact the pharmacy document the reason why and proceed with request.) (Agent: If yes, when and what did the pharmacy advise?)  This is the patient's preferred pharmacy:  [facility] [place], [place] - [id number] [place] [address] Phone: [phone] Fax: [phone]  Is this the correct pharmacy for this prescription? Yes If no, delete pharmacy and type the correct one.   Has the prescription been filled recently? Yes  Is the patient out of the medication? Yes  Has the patient been seen for an appointment in the last year OR does the patient have an upcoming appointment? Yes  Can we respond through MyChart? Yes  Agent: Please be advised that Rx refills may take up to 3 business days. We ask that you follow-up with your pharmacy.

## 2024-08-29 MED ORDER — BENZONATATE 200 MG PO CAPS: 200.0000 mg | ORAL_CAPSULE | Freq: Three times a day (TID) | ORAL | 1 refills | Status: AC | PRN

## 2024-09-03 DIAGNOSIS — J449 Chronic obstructive pulmonary disease, unspecified: Secondary | ICD-10-CM | POA: Diagnosis not present

## 2024-09-05 DIAGNOSIS — E1169 Type 2 diabetes mellitus with other specified complication: Secondary | ICD-10-CM | POA: Diagnosis not present

## 2024-09-05 DIAGNOSIS — E538 Deficiency of other specified B group vitamins: Secondary | ICD-10-CM | POA: Diagnosis not present

## 2024-09-05 DIAGNOSIS — E785 Hyperlipidemia, unspecified: Secondary | ICD-10-CM | POA: Diagnosis not present

## 2024-09-11 DIAGNOSIS — Z6841 Body Mass Index (BMI) 40.0 and over, adult: Secondary | ICD-10-CM | POA: Diagnosis not present

## 2024-09-11 DIAGNOSIS — Z9981 Dependence on supplemental oxygen: Secondary | ICD-10-CM | POA: Diagnosis not present

## 2024-09-11 DIAGNOSIS — E119 Type 2 diabetes mellitus without complications: Secondary | ICD-10-CM | POA: Diagnosis not present

## 2024-09-11 DIAGNOSIS — I10 Essential (primary) hypertension: Secondary | ICD-10-CM | POA: Diagnosis not present

## 2024-09-11 DIAGNOSIS — D751 Secondary polycythemia: Secondary | ICD-10-CM | POA: Diagnosis not present

## 2024-09-11 DIAGNOSIS — E782 Mixed hyperlipidemia: Secondary | ICD-10-CM | POA: Diagnosis not present

## 2024-09-11 DIAGNOSIS — J449 Chronic obstructive pulmonary disease, unspecified: Secondary | ICD-10-CM | POA: Diagnosis not present

## 2024-09-11 DIAGNOSIS — Z720 Tobacco use: Secondary | ICD-10-CM | POA: Diagnosis not present

## 2024-09-11 DIAGNOSIS — Z23 Encounter for immunization: Secondary | ICD-10-CM | POA: Diagnosis not present

## 2024-09-25 ENCOUNTER — Ambulatory Visit: Payer: Self-pay | Admitting: Internal Medicine

## 2024-09-25 NOTE — Telephone Encounter (Signed)
 FYI Only or Action Required?: FYI only for provider: scheduled FU with PAS.  Patient is followed in Pulmonology for DOE, last seen on 07/26/2024 by Ronnie Ozell NOVAK, MD.  Called Nurse Triage reporting Shortness of Breath.  Symptoms began several months ago.  Interventions attempted: Rescue inhaler, Maintenance inhaler, Nebulizer treatments, and Home oxygen  use.  Symptoms are: unchanged.  Triage Disposition: See PCP Within 2 Weeks  Patient/caregiver understands and will follow disposition?: Yes  E2C2 Pulmonary Triage - Initial Assessment Questions "Chief Complaint (e.g., cough, sob, wheezing, fever, chills, sweat or additional symptoms) *Go to specific symptom protocol after initial questions. SOB - claims not worse  "How long have symptoms been present?" Ongoing   Have you tested for COVID or Flu? Note: If not, ask patient if a home test can be taken. If so, instruct patient to call back for positive results. No  MEDICINES:   "Have you used any OTC meds to help with symptoms?" No If yes, ask "What medications?" na  "Have you used your inhalers/maintenance medication?" Yes If yes, "What medications?" Trelegy- qAM Albuterol - q4hrs to BID Nebulizer- just before he goes out  OXYGEN : "Do you wear supplemental oxygen ?" Yes If yes, "How many liters are you supposed to use?" 4L   "Do you monitor your oxygen  levels?" Yes If yes, What is your reading (oxygen  level) today? 88-91%  What is your usual oxygen  saturation reading?  (Note: Pulmonary O2 sats should be 90% or greater) 90-94%   Copied from CRM #[id number]. Topic: Clinical - Red Word Triage >> Sep 25, 2024  3:59 PM [name] wrote: Red Word that prompted transfer to Nurse Triage: Patient [phone] is asking if [name] is moving? Informed patient, [name], [name] is leaving the practice not [name]. Patient states he's suppose to have a CT scan. Informed patient, [name], [name] wants patient to have CT chest Sept 2026.  Patient want to see [name], lov 07/26/24. Patient is having pain off an on in the right side by the lung, since the last two weeks. Patient has shortness of breath is on oxygen . Please advise.    ----------------------------------------------------------------------- From previous Reason for Contact - Scheduling: Patient/patient representative is calling to schedule an appointment. Refer to attachments for appointment information. Reason for Disposition  [1] MILD longstanding difficulty breathing (e.g., minimal/no SOB at rest, SOB with walking, pulse < 100) AND [2] SAME as normal  Answer Assessment - Initial Assessment Questions Patient transferred with concern for worsened SOB. Upon questioning patient is having increased WOB but claims that is not worse than normal. Pt really just wanted to be sure that he had 63month FU with Dr Ronnie scheduled due to concern for him leaving but advised that it is [name] who is leaving and Dr Ronnie will be available.  Oxygen  levels checked on call- 88%- 94%. He claims that he does occasionally dip down into the 70's but that he recovers fairly quickly. Denies headache, Dizziness, or extreme fatigue.  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 Told him he would be having 2-3 mini heart attacks a month. Does get occasional CP- takes nitroglycerin .    1. RESPIRATORY STATUS: Describe your breathing? (e.g., wheezing, shortness of breath, unable to speak, severe coughing)      SOB about the same- 2. ONSET: When did this breathing problem begin?      Worsened  3. PATTERN Does the difficult breathing come and go, or has it been constant since it started?  Fluctuates  4. SEVERITY: How bad is your breathing? (e.g., mild, moderate, severe)      moderate 5. RECURRENT SYMPTOM: Have you had difficulty breathing before? If Yes, ask: When was the last time? and What happened that time?      yes 6. CARDIAC HISTORY: Do you have any history of  heart disease? (e.g., heart attack, angina, bypass surgery, angioplasty)      CABGx4, CAD, angina 7. LUNG HISTORY: Do you have any history of lung disease?  (e.g., pulmonary embolus, asthma, emphysema)     COPD, DOE, OSA 8. CAUSE: What do you think is causing the breathing problem?      COPD, OSA 9. OTHER SYMPTOMS: Do you have any other symptoms? (e.g., chest pain, cough, dizziness, fever, runny nose)     Chest pains- ongoing- takes nitroglycerin  spray  10. O2 SATURATION MONITOR:  Do you use an oxygen  saturation monitor (pulse oximeter) at home? If Yes, ask: What is your reading (oxygen  level) today? What is your usual oxygen  saturation reading? (e.g., 95%)       88-94% on call  Protocols used: Breathing Difficulty-A-AH

## 2024-10-01 DIAGNOSIS — H25813 Combined forms of age-related cataract, bilateral: Secondary | ICD-10-CM | POA: Diagnosis not present

## 2024-10-01 DIAGNOSIS — H5203 Hypermetropia, bilateral: Secondary | ICD-10-CM | POA: Diagnosis not present

## 2024-10-01 DIAGNOSIS — H524 Presbyopia: Secondary | ICD-10-CM | POA: Diagnosis not present

## 2024-10-01 DIAGNOSIS — Z794 Long term (current) use of insulin: Secondary | ICD-10-CM | POA: Diagnosis not present

## 2024-10-01 DIAGNOSIS — E1122 Type 2 diabetes mellitus with diabetic chronic kidney disease: Secondary | ICD-10-CM | POA: Diagnosis not present

## 2024-10-02 ENCOUNTER — Ambulatory Visit (INDEPENDENT_AMBULATORY_CARE_PROVIDER_SITE_OTHER): Admitting: Otolaryngology

## 2024-10-02 ENCOUNTER — Other Ambulatory Visit: Payer: Self-pay

## 2024-10-02 ENCOUNTER — Encounter (INDEPENDENT_AMBULATORY_CARE_PROVIDER_SITE_OTHER): Payer: Self-pay | Admitting: Otolaryngology

## 2024-10-02 VITALS — HR 98 | Temp 98.4°F | Ht 68.0 in | Wt 288.0 lb

## 2024-10-02 DIAGNOSIS — R1310 Dysphagia, unspecified: Secondary | ICD-10-CM

## 2024-10-02 DIAGNOSIS — R09A2 Foreign body sensation, throat: Secondary | ICD-10-CM | POA: Diagnosis not present

## 2024-10-02 DIAGNOSIS — R1319 Other dysphagia: Secondary | ICD-10-CM | POA: Diagnosis not present

## 2024-10-02 DIAGNOSIS — K219 Gastro-esophageal reflux disease without esophagitis: Secondary | ICD-10-CM

## 2024-10-02 DIAGNOSIS — E1159 Type 2 diabetes mellitus with other circulatory complications: Secondary | ICD-10-CM

## 2024-10-02 MED ORDER — FREESTYLE LIBRE 3 PLUS SENSOR MISC: 3 refills | Status: AC

## 2024-10-02 NOTE — Progress Notes (Signed)
 Patient ID: [name], male   DOB: 03-30-1961, 63 y.o.   MRN: [id number]  Follow up: LPR, anxiety, choking sensation  History of Present Illness [name] is a 63 year old male who presents with a choking sensation in his throat.  He experiences a persistent choking sensation in his throat, particularly when swallowing medication. This issue was previously discussed six months ago, but he has not consulted a gastroenterologist. He continues to take pantoprazole  for reflux.  He has a history of anxiety and has been using clonazepam, which was changed from Valium  by his previous primary care doctor. This medication is currently prescribed by a PA at his current doctor's office. He has not seen a psychiatrist for anxiety management.  In early November, he experienced severe chest pain and vomiting while on his back porch, initially suspecting a heart attack. He used nitroglycerin  twice, which alleviated the pain, but he did not seek emergency medical attention. He has a follow-up appointment with his cardiologist scheduled.  Exam: General: Communicates without difficulty, well nourished, no acute distress. Head: Normocephalic, no evidence injury, no tenderness, facial buttresses intact without stepoff. Face/sinus: No tenderness to palpation and percussion. Facial movement is normal and symmetric. Eyes: PERRL, EOMI. No scleral icterus, conjunctivae clear. Neuro: CN II exam reveals vision grossly intact.  No nystagmus at any point of gaze. Ears: Auricles well formed without lesions.  Ear canals are intact without mass or lesion.  No erythema or edema is appreciated.  The TMs are intact without fluid. Nose: External evaluation reveals normal support and skin without lesions.  Dorsum is intact.  Anterior rhinoscopy reveals congested mucosa over anterior aspect of inferior turbinates and intact septum.  No purulence noted. Oral:  Oral cavity and oropharynx are intact, symmetric, without  erythema or edema.  Mucosa is moist without lesions. Neck: Full range of motion without pain.  There is no significant lymphadenopathy.  No masses palpable.  Thyroid  bed within normal limits to palpation.  Parotid glands and submandibular glands equal bilaterally without mass.  Trachea is midline. Neuro:  CN 2-12 grossly intact.    Assessment and Plan Assessment & Plan Laryngopharyngeal reflux Chronic laryngopharyngeal reflux managed with pantoprazole . No physical abnormalities in the throat. Symptoms include coughing and globus sensation. - Continue pantoprazole  for reflux management.  Globus sensation Persistent globus sensation with difficulty swallowing pills. No physical abnormalities in the throat. Possible esophageal stricture considered. - Referred to gastroenterologist for evaluation of possible esophageal stricture and potential dilation.  Anxiety - The patient is strongly encouraged to see a psychiatrist regarding his anxiety disorder. He will talk to his primary care physician for a psychiatry referral.

## 2024-10-03 LAB — LIPID PANEL
Chol/HDL Ratio: 4 ratio (ref 0.0–5.0)
Cholesterol, Total: 137 mg/dL (ref 100–199)
HDL: 34 mg/dL — ABNORMAL LOW (ref >39)
LDL Chol Calc (NIH): 74 mg/dL (ref 0–99)
Triglycerides: 166 mg/dL — ABNORMAL HIGH (ref 0–149)
VLDL Cholesterol Cal: 29 mg/dL (ref 5–40)

## 2024-10-03 LAB — COMPREHENSIVE METABOLIC PANEL WITH GFR
ALT: 19 IU/L (ref 0–44)
AST: 18 IU/L (ref 0–40)
Albumin: 4.6 g/dL (ref 3.9–4.9)
Alkaline Phosphatase: 83 IU/L (ref 47–123)
BUN/Creatinine Ratio: 10 (ref 10–24)
BUN: 14 mg/dL (ref 8–27)
Bilirubin Total: 0.5 mg/dL (ref 0.0–1.2)
CO2: 24 mmol/L (ref 20–29)
Calcium: 10.6 mg/dL — ABNORMAL HIGH (ref 8.6–10.2)
Chloride: 104 mmol/L (ref 96–106)
Creatinine, Ser: 1.39 mg/dL — ABNORMAL HIGH (ref 0.76–1.27)
Globulin, Total: 2.3 g/dL (ref 1.5–4.5)
Glucose: 165 mg/dL — ABNORMAL HIGH (ref 70–99)
Potassium: 4.7 mmol/L (ref 3.5–5.2)
Sodium: 144 mmol/L (ref 134–144)
Total Protein: 6.9 g/dL (ref 6.0–8.5)
eGFR: 58 mL/min/1.73 — ABNORMAL LOW (ref >59)

## 2024-10-03 LAB — FIB-4 W/REFLEX TO ELF
FIB-4 Index: 0.87 (ref 0.00–2.67)
Platelets: 291 x10E3/uL (ref 150–450)

## 2024-10-08 ENCOUNTER — Ambulatory Visit: Admitting: "Endocrinology

## 2024-10-10 ENCOUNTER — Ambulatory Visit: Admitting: "Endocrinology

## 2024-10-10 ENCOUNTER — Encounter: Payer: Self-pay | Admitting: "Endocrinology

## 2024-10-10 VITALS — BP 96/60 | HR 80 | Ht 71.0 in | Wt 283.4 lb

## 2024-10-10 DIAGNOSIS — F17200 Nicotine dependence, unspecified, uncomplicated: Secondary | ICD-10-CM

## 2024-10-10 DIAGNOSIS — E1159 Type 2 diabetes mellitus with other circulatory complications: Secondary | ICD-10-CM

## 2024-10-10 DIAGNOSIS — I10 Essential (primary) hypertension: Secondary | ICD-10-CM | POA: Diagnosis not present

## 2024-10-10 DIAGNOSIS — Z794 Long term (current) use of insulin: Secondary | ICD-10-CM | POA: Diagnosis not present

## 2024-10-10 DIAGNOSIS — E782 Mixed hyperlipidemia: Secondary | ICD-10-CM | POA: Diagnosis not present

## 2024-10-10 MED ORDER — HUMULIN R U-500 KWIKPEN 500 UNIT/ML ~~LOC~~ SOPN: 20.0000 [IU] | PEN_INJECTOR | Freq: Three times a day (TID) | SUBCUTANEOUS | 1 refills | Status: AC

## 2024-10-10 NOTE — Progress Notes (Signed)
 10/10/2024  Endocrinology follow-up note   Subjective:    Patient ID: [name], male    DOB: [birth year].  he is being seen in follow-up  for management of currently uncontrolled symptomatic type 2 diabetes, hypertension, hyperlipidemia, obesity. PMD:  Shona Norleen PEDLAR, MD.   Past Medical History:  Diagnosis Date   Abnormal myocardial perfusion study 01/01/2011   there a small to moderate sized inferobasal scar   Barrett's esophagus    Cataracts, bilateral    Chronic kidney disease    hx of kidney stones   Claudication 11/16/2011   PV test perform shows normal   COPD (chronic obstructive pulmonary disease) (HCC)    Depression    Diabetes (HCC)    type 2 diabetes mellitus   Dysrhythmia    GERD (gastroesophageal reflux disease)    Glaucoma    Hernia of abdominal wall    History of radiation therapy    Right lung, SBRT- 04/16/21-04/30/21- Dr. Lynwood Nasuti   HTN (hypertension)    Hyperlipidemia    Morbid obesity (HCC)    Myocardial infarction (HCC) 2008,2009,2009   OSA (obstructive sleep apnea)    on cpap   PAF (paroxysmal atrial fibrillation) (HCC)    S/P colonoscopy 2009   3-4 mm transverse colon erosions likely secondary to  ASA   S/P endoscopy 10/2010   moderate erosive gastritis, Barrett's esophagus 1-2cm   Sleep apnea    SOB (shortness of breath) 11/03/2007   2D Echo EF 50%-55%   Stroke (HCC) 2017   Past Surgical History:  Procedure Laterality Date   BIOPSY N/A 04/29/2015   Procedure: BIOPSY;  Surgeon: Margo LITTIE Haddock, MD;  Location: [facility];  Service: Endoscopy;  Laterality: N/A;   BIOPSY  08/01/2018   Procedure: BIOPSY;  Surgeon: Haddock Margo LITTIE, MD;  Location: [facility];  Service: Endoscopy;;  esophageal   BIOPSY  10/12/2021   Procedure: BIOPSY;  Surgeon: Cindie Carlin POUR, DO;  Location: [facility];  Service: Endoscopy;;   BIOPSY  05/09/2023   Procedure: BIOPSY;  Surgeon: Cindie Carlin POUR, DO;  Location: [facility];  Service: Endoscopy;;    CABG X 4  03/2008   CARDIAC CATHETERIZATION  2009   stent placement to the left circumflex a 2.25    CARDIAC CATHETERIZATION  07/08/2010   CARDIAC CATHETERIZATION N/A 11/02/2019   COLONOSCOPY WITH PROPOFOL  N/A 09/27/2017   normal ileum, twenty 4 to 8 mm polyps in the sigmoid colon, descending colon, splenic flexure, transverse colon, ascending colon, cecum.  An additional three 2 to 4 mm polyps in the rectum and the descending colon.  Diverticulosis, internal hemorrhoids.  Surgical pathology found the polyps to be one fragment of hyperplastic polyp and 22 fragments of tubular adenoma..  Recommended repeat colonoscop   COLONOSCOPY WITH PROPOFOL  N/A 10/12/2021   Procedure: COLONOSCOPY WITH PROPOFOL ;  Surgeon: Cindie Carlin POUR, DO;  Location: [facility];  Service: Endoscopy;  Laterality: N/A;  1:30pm   CORONARY ARTERY BYPASS GRAFT  2008   4 vessels   CORONARY STENT PLACEMENT  12/29/12   CORONARY STENT PLACEMENT  12/2012   ESOPHAGEAL BRUSHING  05/09/2023   Procedure: ESOPHAGEAL BRUSHING;  Surgeon: Cindie Carlin POUR, DO;  [facility]: [facility];  Service: Endoscopy;;   ESOPHAGEAL DILATION N/A 04/29/2015   Procedure: ESOPHAGEAL DILATION 15 mm, 16 mm;  Surgeon: Margo LITTIE Haddock, MD;  Location: [facility];  Service: Endoscopy;  Laterality: N/A;   ESOPHAGOGASTRODUODENOSCOPY  09/2011   Barrett's esophagus,  no dysplasia on biopsies. Distal esophagitis. Status post dilation. Moderate gastritis and duodenitis, but biopsies benign. Next EGD in November 2015 for surveillance of Barrett's esophagus.   ESOPHAGOGASTRODUODENOSCOPY (EGD) WITH PROPOFOL  N/A 04/29/2015   SLF: 1. Barretts esophagus 2. Moderate non-erosive gastritis.    ESOPHAGOGASTRODUODENOSCOPY (EGD) WITH PROPOFOL  N/A 08/01/2018   Barrett's, repeat in 5 years. Empiric dilatation, mild gastritis   ESOPHAGOGASTRODUODENOSCOPY (EGD) WITH PROPOFOL  N/A 05/09/2023   Procedure: ESOPHAGOGASTRODUODENOSCOPY (EGD) WITH PROPOFOL ;  Surgeon: Cindie Carlin POUR, DO;   Location: [facility];  Service: Endoscopy;  Laterality: N/A;  730am, asa 4   HERNIA REPAIR     ventral hernia repair   LEFT HEART CATHETERIZATION WITH CORONARY/GRAFT ANGIOGRAM N/A 12/29/2012   Procedure: LEFT HEART CATHETERIZATION WITH EL BILE;  Surgeon: Debby DELENA Sor, MD;  Location: [facility] CATH LAB;  Service: Cardiovascular;  Laterality: N/A;   ORIF FIBULA FRACTURE Right 06/03/2015   Procedure: OPEN REDUCTION INTERNAL FIXATION (ORIF) DISTAL FIBULA  FRACTURE;  Surgeon: Maude LELON Right, MD;  Location: [facility];  Service: Orthopedics;  Laterality: Right;   PERCUTANEOUS CORONARY STENT INTERVENTION (PCI-S)  12/29/2012   Procedure: PERCUTANEOUS CORONARY STENT INTERVENTION (PCI-S);  Surgeon: Debby DELENA Sor, MD;  Location: [facility] CATH LAB;  Service: Cardiovascular;;   POLYPECTOMY  09/27/2017   Procedure: POLYPECTOMY;  Surgeon: Harvey Margo CROME, MD;  [facility]: [facility];  Service: Endoscopy;;  cecal polyp, ascending polyps x6, transverse colon polyps x6, splenic flexure polyps x2, descending colon polyps x6, sigmoid  colon polyp x1, rectal polyp x1    POLYPECTOMY  10/12/2021   Procedure: POLYPECTOMY;  Surgeon: Cindie Carlin POUR, DO;  [facility]: [facility];  Service: Endoscopy;;   SAVORY DILATION  09/06/2011   Procedure: SAVORY DILATION;  Surgeon: Margo CHRISTELLA Harvey, MD;  Location: [facility];  Service: Endoscopy;  Laterality: N/A;  Dilated with 15mm   SAVORY DILATION N/A 08/01/2018   Procedure: SAVORY DILATION;  Surgeon: Harvey Margo CROME, MD;  Location: [facility];  Service: Endoscopy;  Laterality: N/A;   Social History   Socioeconomic History   Marital status: Divorced    Spouse name: Not on file   Number of children: Not on file   Years of education: Not on file   Highest education level: Not on file  Occupational History   Not on file  Tobacco Use   Smoking status: Every Day    Current packs/day: 0.50    Average packs/day: 0.5 packs/day for 30.0 years (15.0 ttl pk-yrs)    Types:  Cigarettes   Smokeless tobacco: Never   Tobacco comments:    Smokes 0.25 packs per day ARJ, 12/29/22  Vaping Use   Vaping status: Never Used  Substance and Sexual Activity   Alcohol use: Yes    Comment: occ beer   Drug use: No   Sexual activity: Never  Other Topics Concern   Not on file  Social History Narrative   Not on file   Social Drivers of Health   Financial Resource Strain: Low Risk (03/22/2023)   Received from [facility]   Overall Financial Resource Strain (CARDIA)    Difficulty of Paying Living Expenses: Not very hard  Food Insecurity: Food Insecurity Present (06/11/2024)   Hunger Vital Sign    Worried About Running Out of Food in the Last Year: Often true    Ran Out of Food in the Last Year: Often true  Transportation Needs: No Transportation Needs (06/11/2024)   PRAPARE - Transportation    Lack of  Transportation (Medical): No    Lack of Transportation (Non-Medical): No  Physical Activity: Not on file  Stress: Not on file  Social Connections: Not on file   Outpatient Encounter Medications as of 10/10/2024  Medication Sig   acetaminophen  (TYLENOL ) 500 MG tablet Take 500 mg by mouth every 6 (six) hours as needed for moderate pain.   albuterol  (VENTOLIN  HFA) 108 (90 Base) MCG/ACT inhaler Inhale 1-2 puffs into the lungs every 4 (four) hours as needed for wheezing or shortness of breath.   apixaban  (ELIQUIS ) 5 MG TABS tablet Take 1 tablet (5 mg total) by mouth 2 (two) times daily.   benzonatate  (TESSALON ) 200 MG capsule Take 1 capsule (200 mg total) by mouth 3 (three) times daily as needed for cough.   bisoprolol (ZEBETA) 5 MG tablet Take 5 mg by mouth daily.   blood glucose meter kit and supplies Dispense based on patient and insurance preference. Use up to two times daily as directed. (FOR ICD-E11.65)   Blood Glucose Monitoring Suppl (ONETOUCH VERIO) w/Device KIT 1 each by Does not apply route as needed.   clonazePAM (KLONOPIN) 1 MG tablet Take 1 mg by mouth 2 (two)  times daily as needed.   Continuous Glucose Receiver (FREESTYLE LIBRE 3 READER) DEVI 1 Piece by Does not apply route once as needed for up to 1 dose.   Continuous Glucose Sensor (FREESTYLE LIBRE 3 PLUS SENSOR) MISC Change sensor every 15 days.   Cyanocobalamin  (VITAMIN B-12 IJ) Inject 1,000 mcg as directed every 30 (thirty) days.   diclofenac Sodium (VOLTAREN) 1 % GEL Apply 1 Application topically 4 (four) times daily as needed (pain).   Dulaglutide  (TRULICITY ) 4.5 MG/0.5ML SOAJ Inject 4.5 mg as directed once a week.   empagliflozin  (JARDIANCE ) 25 MG TABS tablet Take 1 tablet (25 mg total) by mouth daily before breakfast.   escitalopram  (LEXAPRO ) 20 MG tablet Take 20 mg by mouth in the morning.   fenofibrate  (TRICOR ) 145 MG tablet Take 145 mg by mouth in the morning.   furosemide  (LASIX ) 40 MG tablet Take 40 mg by mouth daily.   gabapentin  (NEURONTIN ) 300 MG capsule Take 300 mg by mouth 3 (three) times daily.   hydrocortisone  (ANUSOL -HC) 2.5 % rectal cream Place 1 Application rectally 2 (two) times daily.   icosapent  Ethyl (VASCEPA ) 1 g capsule Take 1 capsule (1 g total) by mouth 2 (two) times daily with a meal.   Insulin  Pen Needle (B-D ULTRAFINE III SHORT PEN) 31G X 8 MM MISC Use as directed three time daily to inject insulin    insulin  regular human CONCENTRATED (HUMULIN  R U-500 KWIKPEN) 500 UNIT/ML KwikPen Inject 20 Units into the skin 3 (three) times daily with meals. Only when blood glucose is above 90 and eating.   ipratropium-albuterol  (DUONEB) 0.5-2.5 (3) MG/3ML SOLN Take 3 mLs by nebulization every 6 (six) hours as needed.   isosorbide  mononitrate (IMDUR ) 120 MG 24 hr tablet Take 120 mg by mouth in the morning.   Lancets (ONETOUCH DELICA PLUS LANCET33G) MISC USE TO check blood sugar FOUR TIMES DAILY AS DIRECTED   lidocaine  (LIDODERM ) 5 % Place 1 patch onto the skin daily. Remove & Discard patch within 12 hours or as directed by MD   linaclotide  (LINZESS ) 145 MCG CAPS capsule Take 1  capsule (145 mcg total) by mouth daily before breakfast.   meclizine  (ANTIVERT ) 25 MG tablet Take 25 mg by mouth every 6 (six) hours as needed for dizziness or nausea.   metoprolol  tartrate (LOPRESSOR ) 100  MG tablet Take 1 tablet (100 mg total) by mouth 2 (two) times daily. OFFICE VISIT NEEDED BEFORE ADDITIONAL REFILLS   modafinil  (PROVIGIL ) 200 MG tablet Take 200 mg by mouth daily.   mupirocin ointment (BACTROBAN) 2 % Apply 1 Application topically 2 (two) times daily.   nitroGLYCERIN  (NITROLINGUAL ) 0.4 MG/SPRAY spray Place 1 spray under the tongue every 5 (five) minutes x 3 doses as needed for chest pain.   ONETOUCH VERIO test strip USE TO check blood sugar FOUR TIMES DAILY AS DIRECTED   oxyCODONE  (ROXICODONE ) 5 MG immediate release tablet Take 1 tablet (5 mg total) by mouth every 4 (four) hours as needed for severe pain (pain score 7-10).   pantoprazole  (PROTONIX ) 40 MG tablet Take 1 tablet (40 mg total) by mouth 2 (two) times daily before a meal.   polyethylene glycol powder (GLYCOLAX /MIRALAX ) 17 GM/SCOOP powder Take 17 g once daily in 8 ounces of water .   potassium chloride  SA (KLOR-CON  M) 20 MEQ tablet Take 1 tablet (20 mEq total) by mouth daily. While Taking Lasix /Furosemide  (Patient taking differently: Take 20 mEq by mouth 2 (two) times daily.)   rosuvastatin  (CRESTOR ) 20 MG tablet Take 20 mg by mouth in the morning.   TRELEGY ELLIPTA  100-62.5-25 MCG/ACT AEPB Inhale 1 puff by mouth once daily   valsartan  (DIOVAN ) 40 MG tablet Take 1 tablet (40 mg total) by mouth daily.   [DISCONTINUED] insulin  regular human CONCENTRATED (HUMULIN  R U-500 KWIKPEN) 500 UNIT/ML KwikPen Inject 30 Units into the skin 3 (three) times daily with meals. Only when blood glucose is above 90 and eating.   No facility-administered encounter medications on file as of 10/10/2024.    ALLERGIES: Allergies  Allergen Reactions   Contrast Media [Iodinated Contrast Media] Other (See Comments)    Pt must be premedicated  before given contrast media - stops heart   Iohexol Other (See Comments)     Consult with radiologist before pre meds are given.Desc: PT. STATES HEART STOPPED HAS TO BE PREMED.     VACCINATION STATUS: Immunization History  Administered Date(s) Administered   Influenza Inj Mdck Quad Pf 09/25/2018   Influenza Split 08/23/2014, 10/23/2015   Influenza,inj,Quad PF,6+ Mos 08/03/2016, 07/10/2019, 09/22/2020, 09/14/2021, 07/31/2022   Influenza-Unspecified 09/26/2012    Diabetes He presents for his follow-up diabetic visit. He has type 2 diabetes mellitus. Onset time: He was diagnosed at approximate age of 42 years. His disease course has been stable. There are no hypoglycemic associated symptoms. Pertinent negatives for hypoglycemia include no confusion, headaches, pallor or seizures. Pertinent negatives for diabetes include no blurred vision, no chest pain, no fatigue, no polydipsia, no polyphagia, no polyuria and no weakness. There are no hypoglycemic complications. Symptoms are stable. Diabetic complications include a CVA and heart disease. Risk factors for coronary artery disease include diabetes mellitus, dyslipidemia, hypertension, male sex, obesity, tobacco exposure, sedentary lifestyle and family history. Current diabetic treatment includes insulin  injections. He is compliant with treatment some of the time. His weight is decreasing steadily. He is following a generally unhealthy diet. When asked about meal planning, he reported none. He has not had a previous visit with a dietitian. He never participates in exercise. His home blood glucose trend is fluctuating minimally. His breakfast blood glucose range is generally 130-140 mg/dl. His lunch blood glucose range is generally 130-140 mg/dl. His dinner blood glucose range is generally 130-140 mg/dl. His bedtime blood glucose range is generally 130-140 mg/dl. His overall blood glucose range is 130-140 mg/dl. [name] presents with his CGM  showing  controlled glycemic profile with 74% time in range, 21% level 1 hyperglycemia, 3% level 2 hyperglycemia.  Evidence of 2% level 1 hypoglycemia mostly nocturnal.  He has 32.9% glucose variability.  His point-of-care A1c is 7%.   ) An ACE inhibitor/angiotensin II receptor blocker is being taken. He does not see a podiatrist.Eye exam is not current.  Hyperlipidemia This is a chronic problem. The current episode started more than 1 year ago. The problem is uncontrolled. Recent lipid tests were reviewed and are high. Exacerbating diseases include diabetes and obesity. Pertinent negatives include no chest pain, myalgias or shortness of breath. Current antihyperlipidemic treatment includes statins. Compliance problems include medication cost and psychosocial issues.  Risk factors for coronary artery disease include dyslipidemia, diabetes mellitus, hypertension, male sex, obesity, family history and a sedentary lifestyle.  Hypertension This is a chronic problem. The current episode started more than 1 year ago. The problem is uncontrolled. Pertinent negatives include no blurred vision, chest pain, headaches, neck pain, palpitations or shortness of breath. Risk factors for coronary artery disease include diabetes mellitus, dyslipidemia, male gender, obesity, family history, sedentary lifestyle and smoking/tobacco exposure. Past treatments include ACE inhibitors. Hypertensive end-organ damage includes CVA.    Review of systems  Constitutional: + Minimally fluctuating body weight,  current  Body mass index is 39.53 kg/m. , no fatigue, no subjective hyperthermia, no subjective hypothermia   Objective:    BP 96/60   Pulse 80   Ht 5' 11 (1.803 m)   Wt 283 lb 6.4 oz (128.5 kg)   BMI 39.53 kg/m   Wt Readings from Last 3 Encounters:  10/10/24 283 lb 6.4 oz (128.5 kg)  10/02/24 288 lb (130.6 kg)  07/26/24 290 lb 3.2 oz (131.6 kg)      Physical Exam- Limited  Constitutional:  Body mass index is 39.53  kg/m. , not in acute distress, normal state of mind    CMP ( most recent) CMP     Component Value Date/Time   NA 144 10/02/2024 0933   K 4.7 10/02/2024 0933   CL 104 10/02/2024 0933   CO2 24 10/02/2024 0933   GLUCOSE 165 (H) 10/02/2024 0933   GLUCOSE 95 08/29/2023 2124   BUN 14 10/02/2024 0933   CREATININE 1.39 (H) 10/02/2024 0933   CALCIUM  10.6 (H) 10/02/2024 0933   PROT 6.9 10/02/2024 0933   ALBUMIN 4.6 10/02/2024 0933   AST 18 10/02/2024 0933   ALT 19 10/02/2024 0933   ALKPHOS 83 10/02/2024 0933   BILITOT 0.5 10/02/2024 0933   GFRNONAA >60 08/29/2023 2124   GFRAA 93 12/04/2020 0000   Diabetic Labs (most recent): Lab Results  Component Value Date   HGBA1C 6.9 02/02/2024   HGBA1C 9.2 (A) 10/04/2023   HGBA1C 9.2 (A) 05/17/2023   MICROALBUR 10 06/26/2020     Lipid Panel ( most recent) Lipid Panel     Component Value Date/Time   CHOL 137 10/02/2024 0933   TRIG 166 (H) 10/02/2024 0933   HDL 34 (L) 10/02/2024 0933   CHOLHDL 4.0 10/02/2024 0933   CHOLHDL 6.1 04/28/2014 0121   VLDL 61 (H) 04/28/2014 0121   LDLCALC 74 10/02/2024 0933     Assessment & Plan:   1. DM type 2 causing vascular disease (HCC)  - Patient has currently uncontrolled symptomatic type 2 DM since  63 years of age.  [name] presents with his CGM showing controlled glycemic profile with 74% time in range, 21% level 1 hyperglycemia, 3% level 2 hyperglycemia.  Evidence of 2% level 1 hypoglycemia mostly nocturnal.  He has 32.9% glucose variability.  His point-of-care A1c is 7%.  -his diabetes is complicated by coronary artery disease, CVA, obesity/sedentary life, chronic heavy smoking and [name] remains at extremely high risk  for more acute and chronic complications which include CAD, CVA, CKD, retinopathy, and neuropathy. These are all discussed in detail with the patient.  - I have counseled him on diet management and weight loss, by adopting a carbohydrate restricted/protein rich  diet.  [name] is really struggling to connect the dots in his self-care.  Admittedly, he does not follow the routine meal and incretin times recommended.  However he wishes to stay on the same regimen of insulin  and Trulicity .   - he acknowledges that there is a room for improvement in his food and drink choices. - Suggestion is made for him to avoid simple carbohydrates  from his diet including Cakes, Sweet Desserts, Ice Cream, Soda (diet and regular), Sweet Tea, Candies, Chips, Cookies, Store Bought Juices, Alcohol in Excess of  1-2 drinks a day, Artificial Sweeteners,  Coffee Creamer, and Sugar-free Products, Lemonade. This will help patient to have more stable blood glucose profile and potentially avoid unintended weight gain.  - I encouraged him to switch to  unprocessed or minimally processed complex starch and increased protein intake (animal or plant source), fruits, and vegetables. Whole food, plant-based diet was discussed in detail with him.  - he is advised to stick to a routine mealtimes to eat 3 meals  a day and avoid unnecessary snacks ( to snack only to correct hypoglycemia).   - I have approached him with the following individualized plan to manage diabetes and patient agrees:   -He has social problems, not enough support.  Reportedly sleeps extended hours, could be missing some meals.  He is at risk of hypoglycemia.   He used more insulin  than recommended during his last visit. -In the interest of avoiding hypoglycemia from inadvertent use of U-500 insulin , he is advised to lower his U-500 to 20  units with breakfast, 20 units with lunch and 20 units with supper  for Premeal blood glucose readings above 90 mg per DL,  associated with strict monitoring of blood glucose 4 times a day-before meals and at bedtime, using his CGM continuously. -He is advised to use his meter as a backup when he has problem with his CGM.  - He does not tolerate metformin due to GI side effects.   -He is tolerating and benefiting from Trulicity , advised to continue Trulicity  4.5 mg subcutaneously weekly.   Side effects and precautions discussed with him.  He remains a heavy smoker, and with significant hypertriglyceridemia, at risk for pancreatitis. -Patient is encouraged to call clinic for blood glucose levels less than 70 or above 200 mg /dl. -He is tolerating Jardiance .  I advised him to continue Jardiance  25 mg p.o. daily at breakfast.   Side effects and precautions specifically urinary tract infection and genital mycotic infections was discussed with him.   - Patient specific target  A1c;  LDL, HDL, Triglycerides,  were discussed in detail.  2) BP/HTN:  - His blood pressure is controlled to target.  He is advised to continue his current blood pressure medications including bisoprolol 5 mg p.o. daily, Imdur  120 mg p.o. daily, valsartan  40 mg p.o. daily at breakfast.   She also has Lasix  as needed. The patient was counseled on the dangers of tobacco use, and was advised to  quit.  Reviewed strategies to maximize success, including removing cigarettes and smoking materials from environment.  3) Lipids/HPL: He presents with significantly improved triglyceride levels about 166, generally improving from 469.  This improvement is mainly associated with improvement in his glycemic profile.  His LDL is on target at 74.  He is advised to continue Crestor  20 mg p.o. nightly.   He is advised to continue Vascepa  1 g p.o. twice daily.   4)  Weight/Diet: His BMI is 39.53 kg/m, improving from 44.67--clearly complicating his diabetes care.  He is a candidate for modest weight loss.  CDE Consult has been  initiated , exercise, and detailed carbohydrates information provided.  5) vitamin D  deficiency: He is on ongoing supplement with vitamin D2 50,000 units weekly for 12 weeks.  6) Chronic Care/Health Maintenance:  -he  is on ACEI/ARB and Statin medications and  is encouraged to continue to follow  up with Ophthalmology, Dentist,  Podiatrist at least yearly or according to recommendations, and advised to  quit smoking. I have recommended yearly flu vaccine and pneumonia vaccination at least every 5 years; moderate intensity exercise for up to 150 minutes weekly; and  sleep for at least 7 hours a day.   - I advised patient to maintain close follow up with Shona Norleen PEDLAR, MD for primary care needs.   I spent  42  minutes in the care of the patient today including review of labs from CMP, Lipids, Thyroid  Function, Hematology (current and previous including abstractions from other facilities); face-to-face time discussing  his blood glucose readings/logs, discussing hypoglycemia and hyperglycemia episodes and symptoms, medications doses, his options of short and long term treatment based on the latest standards of care / guidelines;  discussion about incorporating lifestyle medicine;  and documenting the encounter. Risk reduction counseling performed per USPSTF guidelines to reduce  obesity and cardiovascular risk factors.     Please refer to Patient Instructions for Blood Glucose Monitoring and Insulin /Medications Dosing Guide  in media tab for additional information. Please  also refer to  Patient Self Inventory in the Media  tab for reviewed elements of pertinent patient history.  [name] participated in the discussions, expressed understanding, and voiced agreement with the above plans.  All questions were answered to his satisfaction. he is encouraged to contact clinic should he have any questions or concerns prior to his return visit.      Follow up plan: - Return in about 4 months (around 02/08/2025) for Bring Meter/CGM Device/Logs- A1c in Office.  Ranny Earl, MD Phone: [phone]  Fax: [phone]   10/10/2024, 3:14 PM   This note was partially dictated with voice recognition software. Similar sounding words can be transcribed inadequately or may not  be corrected upon  review.

## 2024-10-10 NOTE — Patient Instructions (Signed)

## 2024-10-30 ENCOUNTER — Telehealth: Payer: Self-pay

## 2024-10-30 NOTE — Telephone Encounter (Signed)
 Patient called with concerns about elevated blood glucose levels over the past several days. Current medications:  Jardiance  25 mg daily (AM) Trulicity  4.5 mg weekly on Sundays Humulin  R U-500, 20 units TID  Recent blood glucose readings:  10/30/24: AM - 378; PM - 387 10/29/24: AM - HIGH (>350); PM - 340 10/28/24: AM - 309; PM - 349 10/27/24: AM - 366; PM - 303  Please advise on next steps.

## 2024-10-31 NOTE — Telephone Encounter (Signed)
 Called and left voicemail for patient to increase his insulin  to 30 units TID.

## 2024-11-13 ENCOUNTER — Other Ambulatory Visit: Payer: Self-pay

## 2024-11-13 DIAGNOSIS — R0609 Other forms of dyspnea: Secondary | ICD-10-CM

## 2024-11-13 MED ORDER — TRELEGY ELLIPTA 100-62.5-25 MCG/ACT IN AEPB: 1.0000 | INHALATION_SPRAY | Freq: Every day | RESPIRATORY_TRACT | 0 refills | Status: AC

## 2024-11-13 MED ORDER — TRELEGY ELLIPTA 100-62.5-25 MCG/ACT IN AEPB: 1.0000 | INHALATION_SPRAY | Freq: Every day | RESPIRATORY_TRACT | Status: AC

## 2024-11-13 NOTE — Telephone Encounter (Signed)
 Copied from CRM #[id number]. Topic: Clinical - Prescription Issue >> Nov 12, 2024  1:17 PM Rozanna MATSU wrote: Reason for CRM: pt called to get his TRELEGY ELLIPTA  [id number] MCG/ACT AEPB refilled, but could not remember the mail order information. Stated it was mailed to him but couldn't remember. He is currently out of the meds and stated [facility] is to expensive. Is there anyway someone can find out how he rec'd his prescription and contact pt.  Sent in refill to pharm, pt requested sample until he is able to pay for inhaler waiting for the ok

## 2025-01-22 ENCOUNTER — Ambulatory Visit: Admitting: Internal Medicine

## 2025-02-13 ENCOUNTER — Ambulatory Visit: Admitting: "Endocrinology

## 2025-04-02 ENCOUNTER — Ambulatory Visit (INDEPENDENT_AMBULATORY_CARE_PROVIDER_SITE_OTHER): Admitting: Otolaryngology
